# Patient Record
Sex: Male | Born: 1935 | Race: White | Hispanic: No
Health system: Midwestern US, Community
[De-identification: ages and names within clinical notes are randomized; demographics above are authoritative.]

## PROBLEM LIST (undated history)

## (undated) DIAGNOSIS — I2510 Atherosclerotic heart disease of native coronary artery without angina pectoris: Secondary | ICD-10-CM

## (undated) DIAGNOSIS — G4731 Primary central sleep apnea: Secondary | ICD-10-CM

## (undated) DIAGNOSIS — I714 Abdominal aortic aneurysm, without rupture, unspecified: Secondary | ICD-10-CM

## (undated) DIAGNOSIS — Z951 Presence of aortocoronary bypass graft: Secondary | ICD-10-CM

## (undated) DIAGNOSIS — M109 Gout, unspecified: Secondary | ICD-10-CM

## (undated) DIAGNOSIS — K635 Polyp of colon: Secondary | ICD-10-CM

## (undated) DIAGNOSIS — I739 Peripheral vascular disease, unspecified: Secondary | ICD-10-CM

## (undated) DIAGNOSIS — G629 Polyneuropathy, unspecified: Secondary | ICD-10-CM

## (undated) DIAGNOSIS — I639 Cerebral infarction, unspecified: Secondary | ICD-10-CM

## (undated) DIAGNOSIS — I10 Essential (primary) hypertension: Secondary | ICD-10-CM

## (undated) DIAGNOSIS — R7303 Prediabetes: Secondary | ICD-10-CM

## (undated) DIAGNOSIS — I779 Disorder of arteries and arterioles, unspecified: Secondary | ICD-10-CM

## (undated) DIAGNOSIS — F419 Anxiety disorder, unspecified: Secondary | ICD-10-CM

## (undated) DIAGNOSIS — Z8719 Personal history of other diseases of the digestive system: Secondary | ICD-10-CM

## (undated) DIAGNOSIS — E785 Hyperlipidemia, unspecified: Secondary | ICD-10-CM

## (undated) DIAGNOSIS — K219 Gastro-esophageal reflux disease without esophagitis: Secondary | ICD-10-CM

## (undated) DIAGNOSIS — E782 Mixed hyperlipidemia: Secondary | ICD-10-CM

## (undated) DIAGNOSIS — M1990 Unspecified osteoarthritis, unspecified site: Secondary | ICD-10-CM

## (undated) DIAGNOSIS — G459 Transient cerebral ischemic attack, unspecified: Secondary | ICD-10-CM

## (undated) DIAGNOSIS — I209 Angina pectoris, unspecified: Secondary | ICD-10-CM

## (undated) DIAGNOSIS — I129 Hypertensive chronic kidney disease with stage 1 through stage 4 chronic kidney disease, or unspecified chronic kidney disease: Secondary | ICD-10-CM

## (undated) DIAGNOSIS — F32A Depression, unspecified: Secondary | ICD-10-CM

## (undated) DIAGNOSIS — I219 Acute myocardial infarction, unspecified: Secondary | ICD-10-CM

## (undated) DIAGNOSIS — N400 Enlarged prostate without lower urinary tract symptoms: Secondary | ICD-10-CM

## (undated) DIAGNOSIS — I480 Paroxysmal atrial fibrillation: Secondary | ICD-10-CM

## (undated) DIAGNOSIS — E039 Hypothyroidism, unspecified: Secondary | ICD-10-CM

## (undated) DIAGNOSIS — F329 Major depressive disorder, single episode, unspecified: Secondary | ICD-10-CM

## (undated) HISTORY — DX: Abdominal aortic aneurysm, without rupture: I71.4

## (undated) HISTORY — DX: Gastro-esophageal reflux disease without esophagitis: K21.9

## (undated) HISTORY — PX: FRACTURE SURGERY: SHX138

## (undated) HISTORY — DX: Presence of aortocoronary bypass graft: Z95.1

## (undated) HISTORY — DX: Disorder of arteries and arterioles, unspecified: I77.9

## (undated) HISTORY — DX: Anxiety disorder, unspecified: F41.9

## (undated) HISTORY — DX: Prediabetes: R73.03

## (undated) HISTORY — DX: Major depressive disorder, single episode, unspecified: F32.9

## (undated) HISTORY — DX: Unspecified osteoarthritis, unspecified site: M19.90

## (undated) HISTORY — DX: Peripheral vascular disease, unspecified: I73.9

## (undated) HISTORY — DX: Depression, unspecified: F32.A

## (undated) HISTORY — DX: Primary central sleep apnea: G47.31

## (undated) HISTORY — DX: Mixed hyperlipidemia: E78.2

## (undated) HISTORY — DX: Atherosclerotic heart disease of native coronary artery without angina pectoris: I25.10

## (undated) HISTORY — DX: Paroxysmal atrial fibrillation: I48.0

## (undated) HISTORY — DX: Polyp of colon: K63.5

## (undated) HISTORY — PX: FOOT SURGERY: SHX648

## (undated) HISTORY — PX: TONSILLECTOMY: SUR1361

## (undated) HISTORY — DX: Hypertensive chronic kidney disease with stage 1 through stage 4 chronic kidney disease, or unspecified chronic kidney disease: I12.9

## (undated) HISTORY — PX: RETINAL DETACHMENT SURGERY: SHX105

## (undated) HISTORY — DX: Cerebral infarction, unspecified: I63.9

## (undated) HISTORY — DX: Hypothyroidism, unspecified: E03.9

## (undated) HISTORY — DX: Essential (primary) hypertension: I10

## (undated) HISTORY — PX: COLONOSCOPY: SHX174

## (undated) HISTORY — PX: CATARACT EXTRACTION: SUR2

## (undated) HISTORY — DX: Abdominal aortic aneurysm, without rupture, unspecified: I71.40

## (undated) HISTORY — DX: Hyperlipidemia, unspecified: E78.5

## (undated) HISTORY — PX: INGUINAL HERNIA REPAIR: SUR1180

---
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
## 1958-04-28 DIAGNOSIS — M7061 Trochanteric bursitis, right hip: Secondary | ICD-10-CM | POA: Insufficient documentation

## 1993-04-28 HISTORY — PX: CORONARY ARTERY BYPASS GRAFT: SHX141

## 1998-05-28 ENCOUNTER — Inpatient Hospital Stay (HOSPITAL_COMMUNITY): Admission: EM | Admit: 1998-05-28 | Discharge: 1998-05-29 | Payer: Self-pay | Admitting: Emergency Medicine

## 1998-05-28 ENCOUNTER — Encounter: Payer: Self-pay | Admitting: Emergency Medicine

## 1998-06-23 ENCOUNTER — Emergency Department (HOSPITAL_COMMUNITY): Admission: EM | Admit: 1998-06-23 | Discharge: 1998-06-23 | Payer: Self-pay

## 1999-04-18 ENCOUNTER — Encounter: Admission: RE | Admit: 1999-04-18 | Discharge: 1999-06-18 | Payer: Self-pay | Admitting: Orthopedic Surgery

## 1999-06-04 ENCOUNTER — Ambulatory Visit (HOSPITAL_COMMUNITY): Admission: RE | Admit: 1999-06-04 | Discharge: 1999-06-05 | Payer: Self-pay | Admitting: Cardiology

## 2000-02-04 ENCOUNTER — Emergency Department (HOSPITAL_COMMUNITY): Admission: EM | Admit: 2000-02-04 | Discharge: 2000-02-04 | Payer: Self-pay | Admitting: Emergency Medicine

## 2000-02-04 ENCOUNTER — Encounter: Payer: Self-pay | Admitting: *Deleted

## 2000-02-05 ENCOUNTER — Ambulatory Visit (HOSPITAL_COMMUNITY): Admission: RE | Admit: 2000-02-05 | Discharge: 2000-02-05 | Payer: Self-pay | Admitting: Emergency Medicine

## 2000-02-05 ENCOUNTER — Encounter: Payer: Self-pay | Admitting: Emergency Medicine

## 2000-02-14 ENCOUNTER — Ambulatory Visit: Admission: RE | Admit: 2000-02-14 | Discharge: 2000-02-14 | Payer: Self-pay | Admitting: Orthopedic Surgery

## 2000-02-15 ENCOUNTER — Encounter: Admission: RE | Admit: 2000-02-15 | Discharge: 2000-02-15 | Payer: Self-pay | Admitting: Internal Medicine

## 2000-02-15 ENCOUNTER — Encounter: Payer: Self-pay | Admitting: Internal Medicine

## 2000-02-21 ENCOUNTER — Ambulatory Visit (HOSPITAL_COMMUNITY): Admission: RE | Admit: 2000-02-21 | Discharge: 2000-02-21 | Payer: Self-pay | Admitting: Cardiology

## 2001-01-22 ENCOUNTER — Emergency Department (HOSPITAL_COMMUNITY): Admission: EM | Admit: 2001-01-22 | Discharge: 2001-01-23 | Payer: Self-pay | Admitting: Emergency Medicine

## 2001-04-05 ENCOUNTER — Encounter: Admission: RE | Admit: 2001-04-05 | Discharge: 2001-04-05 | Payer: Self-pay | Admitting: Internal Medicine

## 2001-04-05 ENCOUNTER — Encounter: Payer: Self-pay | Admitting: Internal Medicine

## 2001-10-19 ENCOUNTER — Encounter: Admission: RE | Admit: 2001-10-19 | Discharge: 2001-10-19 | Payer: Self-pay | Admitting: Internal Medicine

## 2001-10-19 ENCOUNTER — Encounter: Payer: Self-pay | Admitting: Internal Medicine

## 2004-09-24 ENCOUNTER — Ambulatory Visit (HOSPITAL_COMMUNITY): Admission: RE | Admit: 2004-09-24 | Discharge: 2004-09-24 | Payer: Self-pay

## 2004-09-24 ENCOUNTER — Ambulatory Visit (HOSPITAL_BASED_OUTPATIENT_CLINIC_OR_DEPARTMENT_OTHER): Admission: RE | Admit: 2004-09-24 | Discharge: 2004-09-24 | Payer: Self-pay

## 2005-02-10 ENCOUNTER — Ambulatory Visit (HOSPITAL_COMMUNITY): Admission: RE | Admit: 2005-02-10 | Discharge: 2005-02-10 | Payer: Self-pay | Admitting: Internal Medicine

## 2006-07-14 ENCOUNTER — Encounter: Admission: RE | Admit: 2006-07-14 | Discharge: 2006-07-14 | Payer: Self-pay | Admitting: Internal Medicine

## 2007-07-26 ENCOUNTER — Encounter: Admission: RE | Admit: 2007-07-26 | Discharge: 2007-07-26 | Payer: Self-pay | Admitting: Internal Medicine

## 2008-04-28 HISTORY — PX: CORONARY ANGIOPLASTY WITH STENT PLACEMENT: SHX49

## 2009-04-03 DIAGNOSIS — K219 Gastro-esophageal reflux disease without esophagitis: Secondary | ICD-10-CM | POA: Insufficient documentation

## 2009-04-03 DIAGNOSIS — Z9861 Coronary angioplasty status: Secondary | ICD-10-CM | POA: Insufficient documentation

## 2009-04-17 ENCOUNTER — Observation Stay (HOSPITAL_COMMUNITY): Admission: EM | Admit: 2009-04-17 | Discharge: 2009-04-18 | Payer: Self-pay | Admitting: Emergency Medicine

## 2009-04-17 ENCOUNTER — Ambulatory Visit: Payer: Self-pay | Admitting: Internal Medicine

## 2009-11-15 DIAGNOSIS — G629 Polyneuropathy, unspecified: Secondary | ICD-10-CM | POA: Insufficient documentation

## 2009-11-15 DIAGNOSIS — R7301 Impaired fasting glucose: Secondary | ICD-10-CM | POA: Insufficient documentation

## 2009-11-15 DIAGNOSIS — I6529 Occlusion and stenosis of unspecified carotid artery: Secondary | ICD-10-CM | POA: Insufficient documentation

## 2009-11-19 ENCOUNTER — Ambulatory Visit: Payer: Self-pay | Admitting: Vascular Surgery

## 2010-04-03 DIAGNOSIS — N401 Enlarged prostate with lower urinary tract symptoms: Secondary | ICD-10-CM | POA: Insufficient documentation

## 2010-07-29 LAB — CARDIAC PANEL(CRET KIN+CKTOT+MB+TROPI)
CK, MB: 1.4 ng/mL (ref 0.3–4.0)
CK, MB: 1.5 ng/mL (ref 0.3–4.0)
Relative Index: INVALID (ref 0.0–2.5)
Total CK: 71 U/L (ref 7–232)
Troponin I: <0.01 ng/mL (ref 0.00–0.06)

## 2010-07-29 LAB — DIFFERENTIAL
Eosinophils Absolute: 0.1 10*3/uL (ref 0.0–0.7)
Eosinophils Relative: 2 % (ref 0–5)
Lymphocytes Relative: 12 % (ref 12–46)
Monocytes Absolute: 0.7 10*3/uL (ref 0.1–1.0)
Neutrophils Relative %: 79 % — ABNORMAL HIGH (ref 43–77)

## 2010-07-29 LAB — POCT CARDIAC MARKERS
CKMB, poc: 1.9 ng/mL (ref 1.0–8.0)
Myoglobin, poc: 182 ng/mL (ref 12–200)
Troponin i, poc: <0.05 ng/mL (ref 0.00–0.09)

## 2010-07-29 LAB — LIPID PANEL
Cholesterol: 109 mg/dL (ref 0–200)
Total CHOL/HDL Ratio: 3.2 RATIO
VLDL: 23 mg/dL (ref 0–40)

## 2010-07-29 LAB — BASIC METABOLIC PANEL
Calcium: 8.8 mg/dL (ref 8.4–10.5)
GFR calc non Af Amer: >60 mL/min (ref >60)
Potassium: 3.7 mEq/L (ref 3.5–5.1)

## 2010-07-29 LAB — CBC
HCT: 36.9 % — ABNORMAL LOW (ref 39.0–52.0)
MCV: 88.9 fL (ref 78.0–100.0)
Platelets: 160 10*3/uL (ref 150–400)
RBC: 4.15 MIL/uL — ABNORMAL LOW (ref 4.22–5.81)

## 2010-09-10 NOTE — Procedures (Signed)
DUPLEX DEEP VENOUS EXAM - LOWER EXTREMITY   INDICATION:  Left leg edema.   HISTORY:  Edema:  Yes.  Trauma/Surgery:  Left ankle fusion of bone.  Pain:  No.  PE:  No.  Previous DVT:  No.  Anticoagulants:  No.  Other:  No.   DUPLEX EXAM:                CFV   SFV   PopV  PTV    GSV                R  L  R  L  R  L  R   L  R  L  Thrombosis    o  o     o     o      o     o  Spontaneous   +  +     +     +      +     +  Phasic        +  +     +     +      +     +  Augmentation  +  +     +     +      +     +  Compressible  +  +     +     +      +     +  Competent     +  +     +     +      +     +   Legend:  + - yes  o - no  p - partial  D - decreased   IMPRESSION:  All veins imaged appear compressible and free from deep  venous thrombus in the left leg.    _____________________________  Quita Skye Hart [place], M.D.   CB/MEDQ  D:  11/19/2009  T:  11/19/2009  Job:  [id number]

## 2010-09-13 NOTE — H&P (Signed)
[place]. [facility]  Patient:    Ian Cummings                           MRN: [id number] Adm. Date:  [id number] Attending:  Silvestre Mesi Dictator:   Donzetta Matters, P.A.                         History and Physical  DATE OF BIRTH:  [birth year]  CHIEF COMPLAINT:  Chest pain.  HISTORY OF PRESENT ILLNESS:  This is a 77 year old male that presents stating that he has had chest pain since mid-January.  This is different from previous angina, with increased fatigue.  This has lasted 5-10 minutes.  No shortness of breath,  nausea, or sweating.  It is a tightness that happens with exertion.  He had a late positive treadmill on May 22, 1999, and also an abnormal Persantine Cardiolite study on May 27, 1999.  Therefore, he is set up for a repeat heart catheterization.  PAST MEDICAL HISTORY:  Atrial fibrillation, TIAs, coronary artery disease, hypercholesterolemia, hypertension, history of smoking, coronary artery bypass grafting, atherosclerotic cardiovascular disease, hiatal hernia, and benign prostatic hypertrophy.  SURGICAL HISTORY:  Coronary artery bypass grafting, August 1995; PTCAs previously; tonsillectomy; right rotator cuff repair; right inguinal hernia repair; mastoidectomy as an infant.  ALLERGIES:  No known drug allergies.  MEDICATIONS:  1. Coumadin 5 mg daily (it has been on hold since Friday).  2. Paxil 20 mg daily.  3. Multivitamin q.d.  4. Visken 5 mg b.i.d.  5. Vitamin E 400 IU q.d.  6. Lipitor 20 mg 1/2 tablet q.d.  7. Vitamin B6.  FAMILY HISTORY:  Mother died, age 7.  Father died, age 12, of unknown causes, probable MI.  He does have positive family history for hypertension.  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  He has been off work since November because of rotator cuff surgery, with Dr. Sherlean Foot.  He is a nonsmoker.  Does drink occasional alcohol.  Does drink decaf coffee.  Denies any drug abuse.  REVIEW OF  SYSTEMS:  Denies any fevers, chills, sweats.  Has had some change in is weight.  States he does sleep well at night.  Denies any change in his vision.  Does not presently wear glasses.  Has been treated for recent sinus infection, ut presently has no change in his hearing.  Chest pain as noted in the HPI.  He does have some skips on his heartbeats.  Denies any shortness of breath.  Denies any  orthopnea or claudication.  States he has had a cough with some yellowish sputum. Is a nonsmoker.  Denies any difficulty with swallowing.  No nausea.  No vomiting. Does have some heartburn or indigestion.  Denies any change in bowel function.  Denies any difficulty with urination.  Does have nocturia x 1.  He does have some painful discomfort to his right shoulder.  Has some weakness and fatigue, but is steady on his feet.  Does not require any assistive devices to ambulate. States he does have history of TIAs.  Has some episodes of depression and feels some problems with stress.  Denies any thyroid disease.  Denies any excessive thirst or excessive hunger.  States he does bleed easily because he is on the Coumadin.  He states e has had some stiffness in his neck since July.  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  It has become a chronic problem since then.  PHYSICAL EXAMINATION:  VITAL SIGNS:  Temperature afebrile.  Pulse 60, respirations 16, blood pressure 138/78.  Weight is 170 pounds.  GENERAL:  Well-developed, well-nourished, white male at the present time in no acute distress.  HEENT:  Pupils equal.  Extraocular movements intact.  Mouth and pharynx is benign. Pink, moist mucosa.  Teeth in good repair.  Normocephalic, atraumatic.  NECK:  Supple.  Midline trachea.  No JVD.  No bruits.  No thyromegaly.  CHEST:  Clear to auscultation with no difficulty with breathing.  No use of  any  accessory muscles.  BREASTS:  Normal contour without tenderness or discharge.  No cervical adenopathy is noted.  HEART:  Regular rate and rhythm.  Normal S1 and S2.  No murmurs, gallops, rubs, or clicks.  ABDOMEN:  Soft, active bowel sounds, nontender.  It is nondistended.  No bruits  over the kidneys.  GENITOURINARY:  Nontender over the bladder.  EXTREMITIES:  Moves upper and lower extremities without any difficulty. Strength is 4/5 upper and 5/5 lower.  No ankle edema is noted.  SKIN:  Warm and dry without jaundice, cyanosis, pallor, or rashes.  Normal capillary refill.  NEUROLOGIC:  Cranial nerves II-XII appear grossly intact.  He is oriented to person, place, time, and situation.  Speech is clear.  There is no resting tremor.  DIAGNOSTIC STUDIES:  Chest x-ray on June 03, 1999 showed no acute disease.  IMPRESSION:  1. Chest pain.  2. Coronary artery disease, status post coronary artery bypass grafting.  3. Hypercholesterolemia.  4. Hypertension.  PLAN:  Follow-up labs.  Schedule for left ___________ possibly, as well as graft heart catheterization and for possible angioplasty if indicated. DD:  06/04/99 TD:  06/04/99 Job: [id number]

## 2010-09-13 NOTE — Op Note (Signed)
NAME:  Ian Cummings, Ian Cummings NO.:  [id number]   MEDICAL RECORD NO.:  [id number]          PATIENT TYPE:  AMB   LOCATION:  [facility]                         FACILITY:  [facility]   PHYSICIAN:  Lorre Munroe., M.D.DATE OF BIRTH:  Sep 12, 1928   DATE OF PROCEDURE:  09/24/2004  DATE OF DISCHARGE:                                 OPERATIVE REPORT   PREOPERATIVE DIAGNOSIS:  Indirect left inguinal hernia.   POSTOPERATIVE DIAGNOSIS:  Indirect left inguinal hernia.   OPERATION:  Repair of left inguinal hernia.   SURGEON:  Lebron Conners, M.D.   ANESTHESIA:  Local with sedation and monitored anesthesia care.   PROCEDURE:  After the patient was monitored and anesthetized, and had  routine preparation and draping of the left groin region, I liberally  infused a long-acting local anesthetic in the operative field and taking  care to block the ilioinguinal nerve.  I used more local anesthetic in the  deeper layers as I deepened my dissection.  I made a 6- to 7-cm more or less  transverse incision from just above the pubic tubercle laterally and  dissected down through the fat until I identified the external oblique and  the superficial inguinal ring.  I opened the external oblique in the  direction of its fibers into the superficial ring, again taking care to  avoid injury to the ilioinguinal nerve and cord structures.  I controlled  the cord at the level of the pubic tubercle and dissected it up, and found  it to be expanded and obviously involved by a hernia.  There was no  appreciable weakness of the medial floor.  I made a longitudinal incision  along the proximal portion of the spermatic cord and separated a large  hernia and lipoma of the cord from the cord vessels and vas deferens.  I  reduced the entire hernia through the deep ring and plugged the deep ring  with a generous plug of polypropylene mesh sewn in place with 3 sutures of 2-  0 silk.  I then fashioned a patch of  polypropylene mesh with a slit cut in  it to allow the spermatic cord to come out of the floor and into the  scrotum.  I then sewed that in with 2-0 Prolene beginning at the pubic  tubercle and sewing medially and superiorly with a basting stitch in the  superficial fascia of the internal oblique, and sewing laterally and  inferiorly with a simple running stitch in the shelving edge of inguinal  ligament.  I used a couple of sutures to sew the mesh together lateral to  the cord to complete the repair and I felt that the repair was secure.  I  then added more local anesthetic and assured good  hemostasis with cautery.  I closed the external oblique and subcutaneous  tissues with separate running layers of 3-0 Vicryl and closed the skin with  interrupted intracuticular 4-0 Vicryl and Steri-Strips.  He tolerated the  operation well.      WB/MEDQ  D:  09/24/2004  T:  09/24/2004  Job:  [id number]  cc:   Soyla Murphy. Renne Crigler, M.D.  [address]  [place]  [place] [address]  Fax: [phone]

## 2010-09-13 NOTE — Discharge Summary (Signed)
[place]. [facility]  Patient:    Ian Cummings, Ian Cummings                          MRN: [id number] Adm. Date:  [id number] Disc. Date: [id number] Attending:  Silvestre Mesi Dictator:   Donzetta Matters, P.A.                           Discharge Summary  DATE OF BIRTH:  Dec 07, 1928  PRINCIPAL DIAGNOSES ON DISCHARGE:  1. Coronary artery disease status post right coronary artery vein graft.  2. High blood pressure.  3. Elevated triglycerides.  4. Elevated cholesterol.  PROCEDURE:  During the patients hospitalization left heart catheterization with  PTCA and stent of RCA vein graft.  CONSULTS:  Dr. Aleen Campi invasive cardiology.  CONDITION ON DISCHARGE:  Stable.  BRIEF HISTORY:  This 77 year old male that was admitted after undergoing elective procedure for a heart catheterization results showing vein graft failure with total occlusion of the RCA vein graft, 3 vessel coronary artery disease, good appearance of the other two vein grafts and LIM graft, has normal left ventricular function, normal abdominal aorta and renal arteries.  He underwent successful PTCA and stenting of the RCA vein graft, also successful perclose of the right femoral artery.  He was then monitored overnight and remained stable.  Vitals remain stable this a.m. going without hematoma or swelling.  I discussed the need for Coumadin, aspirin, and plavix.  Also did review his chart at the office to evaluate his most recent cholesterol readings which did show that his total cholesterol was 155.  LDLs were in the 50s but his triglycerides were  high and in the 300s.  He is going to have Lopid 600 mg b.i.d. added also to his drug regimen.  FOLLOWUP LABS IN THE HOSPITAL:  CBC on June 04, 1999, showed a white count at 8,900, hemoglobin 12.8, hematocrit 36.5.  Platelet count 236.  No further chest  x-rays.  EKG on June 04, 1999, did show sinus bradycardia at the rate of 57. It was  felt that he was stable enough for a discharge to home.  DISCHARGE MEDICATIONS:  1. A new prescription for Plavix 75 mg daily.  2. Lopid 600 mg twice a day before meals.  3. He is to continue his Vitamin B6 daily.  4. Folic acid 1 mg daily.  5. Vitamin E 400 IU daily.  6. Coumadin 5 mg daily.  This is to be started tomorrow.  7. Paxil 20 mg daily.  8. Bisken 5 mg twice a day.  9. He is to continue his Lipitor 20 mg 1/2 tablet daily. 10. Aspirin coated 325 mg daily.  ACTIVITY:  No heavy lifting for 24 hours.  Low cholesterol diet.  WOUND CARE:  He is t9o watch the groin for any bleeding.  SPECIAL INSTRUCTIONS:  Discuss further problems of stiffness to his neck for the past 6 months with Doctors Lucas Mallow or [employer].  Also schedule appointment with Mercy Riding, clinical pharmacist.  Follow cholesterol and triglyceride management.  He is to see Dr. Aleen Campi and Lucas Mallow in 2 weeks and call for an appointment. DD:  06/05/99 TD:  06/05/99 Job: [id number]

## 2010-09-13 NOTE — Cardiovascular Report (Signed)
[place]. [facility]  Patient:    Ian Cummings                           MRN: [id number] Proc. Date: 06/04/99 Adm. Date:  [id number] Attending:  Silvestre Mesi CC:         Cardiac Catheterization Laboratory                        Cardiac Catheterization  PROCEDURE: 1. Left heart catheterization. 2. Coronary cine angiography. 3. Left ventricular cine angiography. 4. Abdominal aortogram. 5. Vein graft cine angiography. 6. Left internal mammary graft cine angiography. 7. Angioplasty with stent placement in the right coronary artery vein graft. 8. Perclose of the right femoral artery.  CARDIOLOGIST:  Aram Candela. Aleen Campi, M.D.  INDICATIONS:  This 77 year old male has a history of three-vessel coronary artery disease, and is status post coronary artery bypass graft surgery in 1995, after  failure of several balloon angioplasty procedures.  He now returns with exertional angina and a restress Cardiolite showed probable evidence for myocardial ischemia.   DESCRIPTION OF PROCEDURE:  After signing an informed consent, the patient was premedicated with 50 mg of Benadryl intravenously and brought to the cardiac catheterization laboratory at [facility].  His right groin was prepped and draped in a sterile fashion, and anesthetized locally with 1% lidocaine.  A 6-French introducer sheath was inserted percutaneously into the right femoral artery.  The 6-French #4 Judkins coronary catheters were used to make injections into the native coronary arteries.  The right coronary catheter was sed to make injections into the vein grafts, including a sequential right coronary artery vein graft, a sequential obtuse marginal and circumflex vein graft, and  diagonal vein graft.  The right coronary catheter was also used to make injection into the left internal mammary artery graft to the left anterior descending coronary artery.  A 6-French pigtail  catheter was used to measure pressures in he left ventricle and aorta, and to make mid-stream injections into the left ventricle and mid-abdominal aorta.  After noting a total occlusion of his native right coronary artery, as well as a total occlusion of his vein graft to his right coronary artery, along with good appearance of his other vein grafts and internal mammary graft, we discussed these findings with the patient and elected to proceed with an angioplasty procedure of his right coronary artery vein graft.  We discussed the possibility of opening his native right coronary artery also.  We  selected a 6-French hockey stick guide catheter which was advanced to the ascending aorta and the tip was easily positioned within the ostium of the right coronary  artery vein graft.  We selected a standard length Cross-it guide wire, which was advanced through the guide catheter into the vein graft, and with moderate to severe difficulty, it was advanced through the totally-occluded area and into the distal segment of the vein graft.  We then withdrew the guide wire and repositioned the guide catheter in the ostium of the native right coronary artery.  After moderate difficulty the guide wire was advanced into the totally-occluded area, and a 2.0 mm x 20.0 mm balloon catheter was advanced over the guide wire and positioned within this totally-occluded area.  After this inflation the balloon catheter was removed and injection again in the right coronary artery showed a dissection line proximally, and no evidence of reflow.  With  an area of long occlusion within this right coronary artery and a small residual vessel, we elected to leave the native right coronary artery without further attempts to do angioplasty, and withdrew he balloon catheter and wire.  The guide catheter was then repositioned within the  vein graft to the right coronary artery, and the guide wire was then  easily passed through the totally-occluded area and again positioned in the distal vein graft. This 2.0 mm x 20.0 mm balloon catheter was then advanced into the right coronary artery vein graft, and sequential dilations were made throughout the graft, beginning in the proximal segment, and extending throughout the graft to the distal segment, each time having an inflation of 30 seconds at 10 atmospheres.  After his sequential dilation of the vein graft, injection again was made into this vein graft, showing opening and antegrade flow with severe stenosis throughout the proximal and middle segment.  We then selected a Sci-Med Magic wall stent, a 5.0 mm long stent, which after the proper preparation was advanced over the guide wire and positioned within the distal and middle segment of the lesion.  This wall stent was then deployed in the distal and middle segment, and after deployment, we selected a 4.0 mm x 30.0 mm Adante balloon catheter.  With the Adante balloon catheter, we  made two inflations in the first stent at 12 atmospheres for 30 seconds.  After  sizing the length required for this second stent in the proximal segment, we chose a 5.0 mm medium stent of the same type, a Sci-Med Magic wall stent, and after the proper preparation this was inserted over the guide wire and positioned within he proximal segment of the right coronary artery vein graft.  This second wall stent was then deployed, following which we reinserted the 4.0 mm x 30.0 mm Adante balloon catheter and two further inflations were made at 12 atmospheres for 30 seconds each.  We then noticed mild stenosis proximal to the stent, and the Adante balloon catheter was positioned within the proximal segment, and a final inflation at 14 atmospheres for 124 seconds was made.  After the spinal inflation, the catheter was removed, and injection again into the right coronary vein graft showed an excellent  angiographic result with 0% residual lesion within the stent, and  very mild, less than 20%, stenosis just proximal to the stent.  There was normal antegrade flow throughout the stent, and very good flow into the distal right  coronary artery system.  There was no evidence of a clot or a dissection.  At the end of the procedure the catheter and sheath were removed from the right femoral artery and hemostasis was easily obtained with a Perclose closure system.  HEMODYNAMIC DATA: Left ventricular pressure:  187/10-27. Aortic pressure:  187/89, with a mean of 126. Left ventricular ejection fraction:  Estimated at approximately 60%.  MEDICATIONS GIVEN:  Heparin 5400 units IV, ReoPro drip per the pharmacy protocol.  CINE FINDINGS: CORONARY CINE ANGIOGRAPHY: 1. Left coronary artery:  The ostium and left main appeared normal. 2. Left anterior descending coronary artery:  The LAD has diffuse severe    stenosis throughout the middle segment with 90%-95% stenosis throughout    the middle segment.  There is distal flow by way of internal mammary graft.    The first diagonal branch is totally occluded. 3. Circumflex coronary artery:  The circumflex coronary artery is totally    occluded in its proximal segment.  The distal segment  fills by way of    vein graft. 4. Right coronary artery:  The right coronary artery is totally occluded in    its middle segment and there is mild collateral flow to the acute angle.    The distal right coronary artery is visualized by way of collateral flow    from the left coronary artery.  VEIN GRAFT CINE ANGIOGRAPHY: 1. Right coronary artery vein graft:  The right coronary artery vein graft was    totally occluded near its origin in the proximal segment.  The remainder    was unvisualized until the angioplasty procedure. 2. Diagonal vein graft:  The diagonal vein graft has a minor lesion in its    proximal segment of approximately 20%, otherwise the diagonal  vein graft    looks normal, and has a normal distal anastomotic site. 3. Circumflex vein graft:  This is a sequential vein graft with two obtuse    marginal branches.  There is a minor 20% lesion in its proximal segment.    This is a focal eccentric lesion and there is minor narrowing in the distal    segment near the first side-to-side anastomotic site.  There is very good    flow into this first obtuse marginal, and there is very good flow into the    distal obtuse marginal and distal circumflex system. 4. Left internal mammary artery graft:  The left internal mammary artery    graft to the LAD appears normal, with very good flow into the mid and    distal LAD.  LEFT VENTRICULAR CINE ANGIOGRAM:  The left ventricular chamber size is mildly enlarged.  The overall left ventricular contractility is normal.  There is normal anterior and apical contraction.  The inferior surface is mildly hypokinetic. he mitral valve is abnormal, with mild to moderate mitral insufficiency.  The aortic valve appears normal.  ABDOMINAL AORTOGRAM:  A mid-stream injection into the abdominal aorta shows a normal-appearing mid-abdominal aorta, with normal renal arteries.  The distal aorta was nonvisualized.  ANGIOPLASTY PROCEDURE:  Initial injections into the right coronary artery show  successful passage of the guide wire through the totally-occluded area and injection following the single balloon dilation shows a proximal dissection but no extension of the dissection and essentially no change in the proximal segment.  Vein graft angioplasty cines show proper positioning of the guide wire, and initial opening throughout the length of the proximal and mid-vein graft, after the balloon dilation with the #2-0 balloon.  Further cines showed proper positioning of the  wall stent in the mid and distal segment, and proper positioning of the #4-0 balloon within this stent, for postdilation. Further cines  showed proper positioning of the proximal stent with good apposition of the two stents over a 1.0 to 2.0 mm segment.  The final injections into the right coronary artery show a minor residual lesion just proximal to the proximal stent of less than 20%. There is normal antegrade flow and a very good appearance of the entire length of the  stented area along with very good antegrade flow, and no evidence of dissection or clot.  There is very good flow into the distal right coronary artery.  This is  sequential graft supplying an anastomosis to the posterior descending and a posterolateral branch to the left ventricle.  FINAL DIAGNOSES: 1. 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 2. Three-vessel coronary artery disease. 3. Good appearance of the  other two vein grafts, with minor proximal plaque    and normal appearance of the left internal mammary graft to the left    anterior descending coronary artery. 4. Normal left ventricular function with mild inferior hypokinesia. 5. Normal abdominal aorta and renal arteries. 6. Successful angioplasty with stenting of the mid and distal segments of    the right coronary artery vein graft. 7. Successful Perclose of the right femoral artery puncture site. DD:  06/04/99 TD:  06/04/99 Job: [id number]

## 2011-03-19 ENCOUNTER — Other Ambulatory Visit: Payer: Self-pay

## 2011-03-25 ENCOUNTER — Ambulatory Visit (INDEPENDENT_AMBULATORY_CARE_PROVIDER_SITE_OTHER): Payer: Medicare (Managed Care) | Admitting: *Deleted

## 2011-03-25 DIAGNOSIS — I739 Peripheral vascular disease, unspecified: Secondary | ICD-10-CM

## 2011-04-29 HISTORY — PX: CAROTID STENT INSERTION: SHX5766

## 2011-05-08 ENCOUNTER — Encounter: Payer: Self-pay | Admitting: Surgery

## 2011-05-09 ENCOUNTER — Encounter: Payer: Self-pay | Admitting: Surgery

## 2011-05-12 ENCOUNTER — Encounter: Payer: Self-pay | Admitting: Surgery

## 2011-05-12 ENCOUNTER — Ambulatory Visit (INDEPENDENT_AMBULATORY_CARE_PROVIDER_SITE_OTHER): Payer: Medicare PPO | Admitting: Surgery

## 2011-05-12 VITALS — BP 129/61 | HR 56 | Resp 16 | Ht 71.0 in | Wt 167.0 lb

## 2011-05-12 DIAGNOSIS — G458 Other transient cerebral ischemic attacks and related syndromes: Secondary | ICD-10-CM

## 2011-05-12 DIAGNOSIS — I739 Peripheral vascular disease, unspecified: Secondary | ICD-10-CM

## 2011-05-12 NOTE — Progress Notes (Signed)
[facility]   Patient name: Ian Cummings MRN: [id number] DOB: 1929/01/22 Sex: male   Referred by: Eric Form  Reason for referral:  Chief Complaint  Patient presents with  . PVD    pain right leg    HISTORY OF PRESENT ILLNESS: This is a very pleasant 78 year old gentleman that I am seeing at the request of Dr. Clelia Croft for evaluation of leg pain. The patient states that for many years she's had problems with his legs. More recently he is having worsening symptoms in his right leg. He describes cramping in his leg giving out approximately half a block. He has had ulcers in the past on his feet however these have healed with the exception of one between his fourth and fifth toe on the left which is placing ointment on it and it has been improving. The patient's symptoms are aggravated with activity and alleviated with rest.  The patient has an extensive cardiac history. He has undergone coronary artery bypass grafting as well as multiple coronary stents. He suffers from hypertension which is medically managed. He's been on a statin for long term for his hyperlipidemia. He does have neuropathy on his feet. He does not smoke.  The patient is scheduled to see a neurologist tomorrow for evaluation of a 80% carotid stenosis. The laterality of which is unknown. He denies neurologic symptoms. Specifically he denies numbness or weakness in either extremity he denies slurred speech. He denies amaurosis fugax.  Past Medical History  Diagnosis Date  . CAD (coronary artery disease)   . Hyperlipidemia   . Hypertension   . Arthritis     Gout  . GERD (gastroesophageal reflux disease)     Past Surgical History  Procedure Date  . Coronary artery bypass graft 1995    numerous stents placed  . Foot surgery   . Coronary angioplasty with stent placement 2010  . Hernia repair     X 2     History   Social History  . Marital Status: Married    Spouse Name: N/A    Number  of Children: N/A  . Years of Education: N/A   Occupational History  . Not on file.   Social History Main Topics  . Smoking status: Former Smoker    Quit date: 04/29/1975  . Smokeless tobacco: Not on file  . Alcohol Use: Yes     Occasional drink  . Drug Use: No  . Sexually Active:    Other Topics Concern  . Not on file   Social History Narrative  . No narrative on file    Family History  Problem Relation Age of Onset  . Heart disease Mother   . Hyperlipidemia Father   . Hypertension Father   . Heart disease Father     Allergies as of 05/12/2011  . (No Known Allergies)    Current Outpatient Prescriptions on File Prior to Visit  Medication Sig Dispense Refill  . allopurinol (ZYLOPRIM) 300 MG tablet Take 300 mg by mouth daily.      Marland Kitchen amLODipine (NORVASC) 10 MG tablet Take 10 mg by mouth daily.      Marland Kitchen aspirin 325 MG tablet Take 325 mg by mouth daily.      Marland Kitchen atenolol (TENORMIN) 50 MG tablet Take 50 mg by mouth daily.      . clopidogrel (PLAVIX) 75 MG tablet Take 75 mg by mouth daily.      . fish oil-omega-3 fatty acids 1000 MG capsule  Take 1 g by mouth daily.       . hydrochlorothiazide (HYDRODIURIL) 25 MG tablet Take 25 mg by mouth daily.      . isosorbide mononitrate (IMDUR) 60 MG 24 hr tablet Take 60 mg by mouth daily.      . nitroGLYCERIN (NITROSTAT) 0.4 MG SL tablet Place 0.4 mg under the tongue every 5 (five) minutes as needed.      . pantoprazole (PROTONIX) 40 MG tablet Take 40 mg by mouth daily.      Marland Kitchen PARoxetine (PAXIL) 20 MG tablet Take 20 mg by mouth every morning.      . ramipril (ALTACE) 2.5 MG tablet Take 2.5 mg by mouth daily.      . rosuvastatin (CRESTOR) 40 MG tablet Take 40 mg by mouth daily.         REVIEW OF SYSTEMS: Possible pain in his right leg when walking. All other review of systems are negative as documented in the encounter form.  PHYSICAL EXAMINATION: General: The patient appears their stated age.  Vital signs are BP 129/61  Pulse 56   Resp 16  Ht 5\' 11"  (1.803 m)  Wt 167 lb (75.751 kg)  BMI 23.29 kg/m2  SpO2 96% HEENT:  No gross abnormalities Pulmonary: Respirations are non-labored Abdomen: Soft and non-tender aorta is not palpable Musculoskeletal: There are no major deformities.   Neurologic: No focal weakness or paresthesias are detected, Skin: There are no ulcer or rashes noted. Healing ulceration between the left fourth and fifth toe without evidence of infection Psychiatric: The patient has normal affect. Cardiovascular: There is a regular rate and rhythm without significant murmur appreciated. Bilateral faint carotid bruits. Palpable femoral pulses.  Diagnostic Studies: ABIs were reviewed. 0.54 on the right and 0.66 on the left  Outside Studies/Documentation   Medication Changes: A prescription was given for cilostazol  Assessment:  #1 claudication 2 carotid artery disease Plan: #1:  The patient is right greater than left leg claudication. I do not feel his symptoms are lifestyle limiting at this time. We have recommended a trial of cilostazol. He will come back in 6 weeks to see if this has helped. #2: Carotid occlusive disease: Basis scheduled to see a neurologist tomorrow for further discussions. We discussed the treatment of carotid occlusive disease including medical management versus stenting versus endarterectomy. In my opinion with his history of in-stent stenosis I would recommend proceeding with carotid endarterectomy. We discussed the risks and benefits of the procedure including the risk of stroke the risk of nerve injury and the risk of recurrent disease. Based on his discussion with the neurologist tomorrow he will contact my office regarding getting is addressed. I told him that since his cardiologist is at [facility] if he would like to have this addressed there that would be appropriate otherwise healthy with happy to take care of this for him. If his stenosis is greater than 80% based on the  ultrasound I would go ahead and schedule him for his operation. We discussed all the issues with surgery today in the office     V. Charlena Cross, M.D. [facility] [place] Office: [phone] Pager:  [phone]

## 2011-06-23 ENCOUNTER — Ambulatory Visit: Payer: Medicare PPO | Admitting: Surgery

## 2011-09-24 DIAGNOSIS — H3090 Unspecified chorioretinal inflammation, unspecified eye: Secondary | ICD-10-CM | POA: Insufficient documentation

## 2011-12-04 DIAGNOSIS — I70219 Atherosclerosis of native arteries of extremities with intermittent claudication, unspecified extremity: Secondary | ICD-10-CM

## 2011-12-04 HISTORY — DX: Atherosclerosis of native arteries of extremities with intermittent claudication, unspecified extremity: I70.219

## 2012-01-15 ENCOUNTER — Encounter (HOSPITAL_COMMUNITY)
Admission: RE | Admit: 2012-01-15 | Discharge: 2012-01-15 | Disposition: A | Discharge status: Home / Self Care | Payer: Medicare PPO | Source: Ambulatory Visit | Attending: Internal Medicine | Admitting: Internal Medicine

## 2012-01-15 DIAGNOSIS — Z5189 Encounter for other specified aftercare: Secondary | ICD-10-CM | POA: Insufficient documentation

## 2012-01-15 DIAGNOSIS — I2510 Atherosclerotic heart disease of native coronary artery without angina pectoris: Secondary | ICD-10-CM | POA: Insufficient documentation

## 2012-01-15 NOTE — Progress Notes (Signed)
Cardiac Rehab Medication Review by a Pharmacist  Does the patient  feel that his/her medications are working for him/her?  yes  Has the patient been experiencing any side effects to the medications prescribed?  no  Does the patient measure his/her own blood pressure or blood glucose at home?  no   Does the patient have any problems obtaining medications due to transportation or finances?   no  Understanding of regimen: excellent Understanding of indications: good Potential of compliance: good- forgets medications once or twice a month    Pharmacist comments: Patient has good understanding of his medications. Of note patient says  he is also on a study medication which could be "placebo"    Dub Mikes 01/15/2012 8:53 AM

## 2012-01-19 ENCOUNTER — Encounter (HOSPITAL_COMMUNITY)
Admission: RE | Admit: 2012-01-19 | Discharge: 2012-01-19 | Disposition: A | Discharge status: Home / Self Care | Payer: Medicare PPO | Source: Ambulatory Visit | Attending: Internal Medicine | Admitting: Internal Medicine

## 2012-01-19 LAB — GLUCOSE, CAPILLARY: Glucose-Capillary: 92 mg/dL (ref 70–99)

## 2012-01-19 NOTE — Progress Notes (Signed)
Pt started cardiac rehab today.  Pt tolerated light exercise without difficulty. Monitor showed Sr with no noted ectopy.  Pt given peanut butter and crackers along with lemonade.  Pt woke up late and did not eat any breakfast.

## 2012-01-21 ENCOUNTER — Encounter (HOSPITAL_COMMUNITY)
Admission: RE | Admit: 2012-01-21 | Discharge: 2012-01-21 | Disposition: A | Discharge status: Home / Self Care | Payer: Medicare PPO | Source: Ambulatory Visit | Attending: Internal Medicine | Admitting: Internal Medicine

## 2012-01-23 ENCOUNTER — Encounter (HOSPITAL_COMMUNITY)
Admission: RE | Admit: 2012-01-23 | Discharge: 2012-01-23 | Disposition: A | Discharge status: Home / Self Care | Payer: Medicare PPO | Source: Ambulatory Visit | Attending: Internal Medicine | Admitting: Internal Medicine

## 2012-01-26 ENCOUNTER — Encounter (HOSPITAL_COMMUNITY)
Admission: RE | Admit: 2012-01-26 | Discharge: 2012-01-26 | Disposition: A | Discharge status: Home / Self Care | Payer: Medicare PPO | Source: Ambulatory Visit | Attending: Internal Medicine | Admitting: Internal Medicine

## 2012-01-28 ENCOUNTER — Encounter (HOSPITAL_COMMUNITY)
Admission: RE | Admit: 2012-01-28 | Discharge: 2012-01-28 | Disposition: A | Discharge status: Home / Self Care | Payer: Medicare PPO | Source: Ambulatory Visit | Attending: Internal Medicine | Admitting: Internal Medicine

## 2012-01-28 DIAGNOSIS — I2510 Atherosclerotic heart disease of native coronary artery without angina pectoris: Secondary | ICD-10-CM | POA: Insufficient documentation

## 2012-01-28 DIAGNOSIS — Z5189 Encounter for other specified aftercare: Secondary | ICD-10-CM | POA: Insufficient documentation

## 2012-01-28 NOTE — Progress Notes (Signed)
Reviewed home exercise with pt today.  Pt plans to walk daily For exercise.  Reviewed THR, pulse, RPE, sign and symptoms, NTG use, and when to call 911 or MD.  Pt voiced understanding.  

## 2012-01-28 NOTE — Progress Notes (Signed)
Pt noted to have low hr 49 during cool down.  Pt asymptomatic.  Strips and rehab report faxed to Dr. Buren Kos for review.

## 2012-01-30 ENCOUNTER — Encounter (HOSPITAL_COMMUNITY)
Admission: RE | Admit: 2012-01-30 | Discharge: 2012-01-30 | Disposition: A | Discharge status: Home / Self Care | Payer: Medicare PPO | Source: Ambulatory Visit | Attending: Internal Medicine | Admitting: Internal Medicine

## 2012-02-02 ENCOUNTER — Encounter (HOSPITAL_COMMUNITY): Payer: Medicare PPO

## 2012-02-02 ENCOUNTER — Encounter (HOSPITAL_COMMUNITY)
Admission: RE | Admit: 2012-02-02 | Discharge: 2012-02-02 | Disposition: A | Discharge status: Home / Self Care | Payer: Medicare PPO | Source: Ambulatory Visit | Attending: Internal Medicine | Admitting: Internal Medicine

## 2012-02-04 ENCOUNTER — Encounter (HOSPITAL_COMMUNITY)
Admission: RE | Admit: 2012-02-04 | Discharge: 2012-02-04 | Disposition: A | Discharge status: Home / Self Care | Payer: Medicare PPO | Source: Ambulatory Visit | Attending: Internal Medicine | Admitting: Internal Medicine

## 2012-02-06 ENCOUNTER — Encounter (HOSPITAL_COMMUNITY)
Admission: RE | Admit: 2012-02-06 | Discharge: 2012-02-06 | Disposition: A | Discharge status: Home / Self Care | Payer: Medicare PPO | Source: Ambulatory Visit | Attending: Internal Medicine | Admitting: Internal Medicine

## 2012-02-09 ENCOUNTER — Encounter (HOSPITAL_COMMUNITY)
Admission: RE | Admit: 2012-02-09 | Discharge: 2012-02-09 | Disposition: A | Discharge status: Home / Self Care | Payer: Medicare PPO | Source: Ambulatory Visit | Attending: Internal Medicine | Admitting: Internal Medicine

## 2012-02-11 ENCOUNTER — Encounter (HOSPITAL_COMMUNITY)
Admission: RE | Admit: 2012-02-11 | Discharge: 2012-02-11 | Disposition: A | Discharge status: Home / Self Care | Payer: Medicare PPO | Source: Ambulatory Visit | Attending: Internal Medicine | Admitting: Internal Medicine

## 2012-02-13 ENCOUNTER — Encounter (HOSPITAL_COMMUNITY)
Admission: RE | Admit: 2012-02-13 | Discharge: 2012-02-13 | Disposition: A | Discharge status: Home / Self Care | Payer: Medicare PPO | Source: Ambulatory Visit | Attending: Internal Medicine | Admitting: Internal Medicine

## 2012-02-16 ENCOUNTER — Encounter (HOSPITAL_COMMUNITY)
Admission: RE | Admit: 2012-02-16 | Discharge: 2012-02-16 | Disposition: A | Discharge status: Home / Self Care | Payer: Medicare PPO | Source: Ambulatory Visit | Attending: Internal Medicine | Admitting: Internal Medicine

## 2012-02-18 ENCOUNTER — Encounter (HOSPITAL_COMMUNITY)
Admission: RE | Admit: 2012-02-18 | Discharge: 2012-02-18 | Disposition: A | Discharge status: Home / Self Care | Payer: Medicare PPO | Source: Ambulatory Visit | Attending: Internal Medicine | Admitting: Internal Medicine

## 2012-02-18 NOTE — Progress Notes (Signed)
Ian Cummings 78 y.o. male Nutrition Note Spoke with pt.  Nutrition Plan and cholesterol goals reviewed with pt. Pt is following Step 2 of the Therapeutic Lifestyle Changes diet. Age-appropriate nutrition discussed. Pt expressed understanding of the information reviewed. Pt aware of nutrition education classes offered and does not plan on attending nutrition classes and declined class handouts "because I know all of it already and I already have resources at home."   Nutrition Diagnosis   Food-and nutrition-related knowledge deficit related to lack of exposure to information as related to diagnosis of: ? CVD ? Pre-DM   Nutrition RX/ Estimated Daily Nutrition Needs for: wt maintenance 2200-2400 Kcal, 70-75 gm fat, 14-16 gm sat fat, 2.2-2.4 gm trans-fat, <1500 mg sodium   Nutrition Intervention   Pt's individual nutrition plan including cholesterol goals reviewed with pt.   Pt to attend the Portion Distortion class - met 01/21/12   Continue client-centered nutrition education by RD, as part of interdisciplinary care. Goal(s)   Pt to describe the benefit of including fruits, vegetables, whole grains, and low-fat dairy products in a heart healthy meal plan. Monitor and Evaluate progress toward nutrition goal with team. Nutrition Risk: Change to Low

## 2012-02-20 ENCOUNTER — Encounter (HOSPITAL_COMMUNITY)
Admission: RE | Admit: 2012-02-20 | Discharge: 2012-02-20 | Disposition: A | Discharge status: Home / Self Care | Payer: Medicare PPO | Source: Ambulatory Visit | Attending: Internal Medicine | Admitting: Internal Medicine

## 2012-02-23 ENCOUNTER — Encounter (HOSPITAL_COMMUNITY): Payer: Medicare PPO

## 2012-02-25 ENCOUNTER — Encounter (HOSPITAL_COMMUNITY)
Admission: RE | Admit: 2012-02-25 | Discharge: 2012-02-25 | Disposition: A | Discharge status: Home / Self Care | Payer: Medicare PPO | Source: Ambulatory Visit | Attending: Internal Medicine | Admitting: Internal Medicine

## 2012-02-27 ENCOUNTER — Encounter (HOSPITAL_COMMUNITY): Payer: Medicare PPO

## 2012-03-01 ENCOUNTER — Encounter (HOSPITAL_COMMUNITY)
Admission: RE | Admit: 2012-03-01 | Discharge: 2012-03-01 | Disposition: A | Discharge status: Home / Self Care | Payer: Medicare PPO | Source: Ambulatory Visit | Attending: Internal Medicine | Admitting: Internal Medicine

## 2012-03-01 DIAGNOSIS — I2510 Atherosclerotic heart disease of native coronary artery without angina pectoris: Secondary | ICD-10-CM | POA: Insufficient documentation

## 2012-03-01 DIAGNOSIS — Z5189 Encounter for other specified aftercare: Secondary | ICD-10-CM | POA: Insufficient documentation

## 2012-03-03 ENCOUNTER — Encounter (HOSPITAL_COMMUNITY)
Admission: RE | Admit: 2012-03-03 | Discharge: 2012-03-03 | Disposition: A | Discharge status: Home / Self Care | Payer: Medicare PPO | Source: Ambulatory Visit | Attending: Internal Medicine | Admitting: Internal Medicine

## 2012-03-05 ENCOUNTER — Encounter (HOSPITAL_COMMUNITY)
Admission: RE | Admit: 2012-03-05 | Discharge: 2012-03-05 | Disposition: A | Discharge status: Home / Self Care | Payer: Medicare PPO | Source: Ambulatory Visit | Attending: Internal Medicine | Admitting: Internal Medicine

## 2012-03-05 NOTE — Progress Notes (Signed)
Ian Cummings 78 y.o. male Nutrition Note Spoke with pt.  Nutrition Survey results reviewed with pt. Pt is following Step 2 of the Therapeutic Lifestyle Changes diet. Pt consumed 4 servings of fruits and vegetables on his 24 hour food record. Pt encouraged to increase fruit and vegetable intake to 5-9 servings daily. Pt expressed understanding of the information reviewed.   Nutrition Diagnosis   Food-and nutrition-related knowledge deficit related to lack of exposure to information as related to diagnosis of: ? CVD ? Pre-DM   Nutrition RX/ Estimated Daily Nutrition Needs for: wt maintenance 2200-2400 Kcal, 70-75 gm fat, 14-16 gm sat fat, 2.2-2.4 gm trans-fat, <1500 mg sodium   Nutrition Intervention   Benefits of adopting Therapeutic Lifestyle Changes discussed when Medficts reviewed.    Continue client-centered nutrition education by RD, as part of interdisciplinary care. Goal(s)   Pt to describe the benefit of including fruits, vegetables, whole grains, and low-fat dairy products in a heart healthy meal plan. Monitor and Evaluate progress toward nutrition goal with team. Nutrition Risk: Low

## 2012-03-08 ENCOUNTER — Encounter (HOSPITAL_COMMUNITY)
Admission: RE | Admit: 2012-03-08 | Discharge: 2012-03-08 | Disposition: A | Discharge status: Home / Self Care | Payer: Medicare PPO | Source: Ambulatory Visit | Attending: Internal Medicine | Admitting: Internal Medicine

## 2012-03-10 ENCOUNTER — Encounter (HOSPITAL_COMMUNITY)
Admission: RE | Admit: 2012-03-10 | Discharge: 2012-03-10 | Disposition: A | Discharge status: Home / Self Care | Payer: Medicare PPO | Source: Ambulatory Visit | Attending: Internal Medicine | Admitting: Internal Medicine

## 2012-03-10 NOTE — Progress Notes (Signed)
Pt noted to have great difficulty getting on the mat for cool down stretches. Pt stated he had a back spasm. Pt assisted to stand after cool down.  Pt moved better getting up but complained of feeling dizzy and weak.  bp 108/50 and blood glucose 125.  Pt remarked that he hadnt checked his blood glucose in weeks.  Pt is diet controlled. Pt had oatmeal with splenda this morning which is a little lighter than his usual.  Pt also admitted that he worked harder today with exercise.  Pt given gatorade to drink and felt better.  Standing blood pressure 118/50.  Pt advised to eat something with a protein component for breakfast.  Pt also advised to use the chair for stretches.  Pt with known hip discomfort and has recently had xrays.  Pt will see Dr. Rayburn Ma on 11/27 as a orthopedic consult.  Pt verbalized understanding.

## 2012-03-12 ENCOUNTER — Encounter (HOSPITAL_COMMUNITY): Payer: Medicare PPO

## 2012-03-12 ENCOUNTER — Encounter (HOSPITAL_COMMUNITY)
Admission: RE | Admit: 2012-03-12 | Discharge: 2012-03-12 | Disposition: A | Discharge status: Home / Self Care | Payer: Medicare PPO | Source: Ambulatory Visit | Attending: Internal Medicine | Admitting: Internal Medicine

## 2012-03-15 ENCOUNTER — Encounter (HOSPITAL_COMMUNITY)
Admission: RE | Admit: 2012-03-15 | Discharge: 2012-03-15 | Disposition: A | Discharge status: Home / Self Care | Payer: Medicare PPO | Source: Ambulatory Visit | Attending: Internal Medicine | Admitting: Internal Medicine

## 2012-03-17 ENCOUNTER — Encounter (HOSPITAL_COMMUNITY): Payer: Medicare PPO

## 2012-03-19 ENCOUNTER — Encounter (HOSPITAL_COMMUNITY): Payer: Medicare PPO

## 2012-03-22 ENCOUNTER — Encounter (HOSPITAL_COMMUNITY)
Admission: RE | Admit: 2012-03-22 | Discharge: 2012-03-22 | Disposition: A | Discharge status: Home / Self Care | Payer: Medicare PPO | Source: Ambulatory Visit | Attending: Internal Medicine | Admitting: Internal Medicine

## 2012-03-24 ENCOUNTER — Encounter (HOSPITAL_COMMUNITY)
Admission: RE | Admit: 2012-03-24 | Discharge: 2012-03-24 | Disposition: A | Discharge status: Home / Self Care | Payer: Medicare PPO | Source: Ambulatory Visit | Attending: Internal Medicine | Admitting: Internal Medicine

## 2012-03-29 ENCOUNTER — Encounter (HOSPITAL_COMMUNITY): Payer: Medicare PPO

## 2012-03-31 ENCOUNTER — Encounter (HOSPITAL_COMMUNITY)
Admission: RE | Admit: 2012-03-31 | Discharge: 2012-03-31 | Disposition: A | Discharge status: Home / Self Care | Payer: Medicare PPO | Source: Ambulatory Visit | Attending: Internal Medicine | Admitting: Internal Medicine

## 2012-03-31 DIAGNOSIS — Z5189 Encounter for other specified aftercare: Secondary | ICD-10-CM | POA: Insufficient documentation

## 2012-03-31 DIAGNOSIS — I2510 Atherosclerotic heart disease of native coronary artery without angina pectoris: Secondary | ICD-10-CM | POA: Insufficient documentation

## 2012-03-31 NOTE — Progress Notes (Signed)
Pt arrived to exercise session. Pt informed rehab staff that he would not be able to continue at this time.  Pt seen by Dr. Rayburn Ma and was advised to do therapy at home for his hip and now back discomfort.  Pt would like to go on medical leave and re contact cardiac rehab in January to resume his exercise/

## 2012-04-02 ENCOUNTER — Encounter (HOSPITAL_COMMUNITY): Payer: Medicare PPO

## 2012-04-05 ENCOUNTER — Encounter (HOSPITAL_COMMUNITY): Payer: Medicare PPO

## 2012-04-07 ENCOUNTER — Encounter (HOSPITAL_COMMUNITY): Payer: Medicare PPO

## 2012-04-09 ENCOUNTER — Encounter (HOSPITAL_COMMUNITY): Payer: Medicare PPO

## 2012-04-12 ENCOUNTER — Encounter (HOSPITAL_COMMUNITY): Payer: Medicare PPO

## 2012-04-12 ENCOUNTER — Encounter (HOSPITAL_COMMUNITY)
Admission: RE | Admit: 2012-04-12 | Discharge: 2012-04-12 | Disposition: A | Discharge status: Home / Self Care | Payer: Medicare PPO | Source: Ambulatory Visit | Attending: Internal Medicine | Admitting: Internal Medicine

## 2012-04-14 ENCOUNTER — Encounter (HOSPITAL_COMMUNITY): Payer: Medicare PPO

## 2012-04-14 ENCOUNTER — Emergency Department (HOSPITAL_COMMUNITY): Payer: Medicare PPO

## 2012-04-14 ENCOUNTER — Emergency Department (HOSPITAL_COMMUNITY)
Admission: EM | Admit: 2012-04-14 | Discharge: 2012-04-14 | Disposition: A | Discharge status: Home / Self Care | Payer: Medicare PPO | Attending: Emergency Medicine | Admitting: Emergency Medicine

## 2012-04-14 ENCOUNTER — Encounter (HOSPITAL_COMMUNITY): Payer: Self-pay | Admitting: *Deleted

## 2012-04-14 DIAGNOSIS — K219 Gastro-esophageal reflux disease without esophagitis: Secondary | ICD-10-CM | POA: Insufficient documentation

## 2012-04-14 DIAGNOSIS — R002 Palpitations: Secondary | ICD-10-CM | POA: Insufficient documentation

## 2012-04-14 DIAGNOSIS — Z9889 Other specified postprocedural states: Secondary | ICD-10-CM | POA: Insufficient documentation

## 2012-04-14 DIAGNOSIS — Z951 Presence of aortocoronary bypass graft: Secondary | ICD-10-CM | POA: Insufficient documentation

## 2012-04-14 DIAGNOSIS — Z79899 Other long term (current) drug therapy: Secondary | ICD-10-CM | POA: Insufficient documentation

## 2012-04-14 DIAGNOSIS — Z87891 Personal history of nicotine dependence: Secondary | ICD-10-CM | POA: Insufficient documentation

## 2012-04-14 DIAGNOSIS — Z8679 Personal history of other diseases of the circulatory system: Secondary | ICD-10-CM | POA: Insufficient documentation

## 2012-04-14 DIAGNOSIS — I10 Essential (primary) hypertension: Secondary | ICD-10-CM | POA: Insufficient documentation

## 2012-04-14 DIAGNOSIS — E785 Hyperlipidemia, unspecified: Secondary | ICD-10-CM | POA: Insufficient documentation

## 2012-04-14 DIAGNOSIS — I2510 Atherosclerotic heart disease of native coronary artery without angina pectoris: Secondary | ICD-10-CM | POA: Insufficient documentation

## 2012-04-14 DIAGNOSIS — Z8739 Personal history of other diseases of the musculoskeletal system and connective tissue: Secondary | ICD-10-CM | POA: Insufficient documentation

## 2012-04-14 DIAGNOSIS — Z7901 Long term (current) use of anticoagulants: Secondary | ICD-10-CM | POA: Insufficient documentation

## 2012-04-14 DIAGNOSIS — Z7982 Long term (current) use of aspirin: Secondary | ICD-10-CM | POA: Insufficient documentation

## 2012-04-14 LAB — POCT I-STAT, CHEM 8
BUN: 26 mg/dL — ABNORMAL HIGH (ref 6–23)
Calcium, Ion: 1.16 mmol/L (ref 1.13–1.30)
Chloride: 101 mEq/L (ref 96–112)
Creatinine, Ser: 1 mg/dL (ref 0.50–1.35)
Glucose, Bld: 136 mg/dL — ABNORMAL HIGH (ref 70–99)
TCO2: 28 mmol/L (ref 0–100)

## 2012-04-14 LAB — POCT I-STAT TROPONIN I: Troponin i, poc: 0 ng/mL (ref 0.00–0.08)

## 2012-04-14 LAB — CBC
HCT: 41.9 % (ref 39.0–52.0)
Hemoglobin: 14.9 g/dL (ref 13.0–17.0)
MCH: 32.3 pg (ref 26.0–34.0)
MCV: 90.7 fL (ref 78.0–100.0)
Platelets: 141 10*3/uL — ABNORMAL LOW (ref 150–400)
RBC: 4.62 MIL/uL (ref 4.22–5.81)
WBC: 6.9 10*3/uL (ref 4.0–10.5)

## 2012-04-14 NOTE — ED Provider Notes (Signed)
History     CSN: [id number]  Arrival date & time 04/14/12  0235   First MD Initiated Contact with Patient 04/14/12 0256      Chief Complaint  Patient presents with  . Tachycardia    (Consider location/radiation/quality/duration/timing/severity/associated sxs/prior treatment) HPI 78 year old male presents to emergency department complaining of elevated blood pressure and fast heart rate. Patient reports tonight he had episode of his typical reflux with tightness in his throat and a taste of wine and peanuts in the back of his mouth (last meal before bed). Patient had resolution of his GERD symptoms, and laid down. Soon after that he had the sensation that his heart was racing. He took his blood pressure, and his cuff read his heart rate at 158 with a blood pressure of 140s over 80s area and he checked it a few more times, and BP got up to 179/100. Patient denies any shortness of breath at any time. He had some slight tightness in his chest. Upon arrival to the emergency apartment, he had resolution of the fast heart rate. Pt's total time of fast HR about 20 min.  Patient with extensive past cardiac history with a CABG and multiple stents. He has had problems with his stents occluding early. He is followed at [facility] cardiology [facility]. Patient's last stent was placed in July. He was seen by his cardiologist last week and had a clean bill of health. No prior history of dysrhythmia, atrial fibrillation SVT or other fast heart rate. He denies any current shortness of breath chest pain leg swelling or calf pain.  Past Medical History  Diagnosis Date  . CAD (coronary artery disease)   . Hyperlipidemia   . Hypertension   . Arthritis     Gout  . GERD (gastroesophageal reflux disease)   . Angina at rest     Past Surgical History  Procedure Date  . Coronary artery bypass graft 1995    numerous stents placed  . Foot surgery   . Coronary angioplasty with stent placement 2010  .  Hernia repair     X 2     Family History  Problem Relation Age of Onset  . Heart disease Mother   . Hyperlipidemia Father   . Hypertension Father   . Heart disease Father     History  Substance Use Topics  . Smoking status: Former Smoker    Quit date: 04/29/1975  . Smokeless tobacco: Not on file  . Alcohol Use: Yes     Comment: Occasional drink      Review of Systems  See History of Present Illness; otherwise all other systems are reviewed and negative  Allergies  Review of patient's allergies indicates no known allergies.  Home Medications   Current Outpatient Rx  Name  Route  Sig  Dispense  Refill  . ALLOPURINOL 300 MG PO TABS   Oral   Take 300 mg by mouth daily.         Marland Kitchen AMLODIPINE BESYLATE 10 MG PO TABS   Oral   Take 10 mg by mouth daily.         . ASPIRIN 81 MG PO TABS   Oral   Take 162 mg by mouth 2 (two) times daily.         . ATENOLOL 50 MG PO TABS   Oral   Take 50 mg by mouth daily.         Marland Kitchen VITAMIN D 1000 UNITS PO TABS  Oral   Take 2,000 Units by mouth daily.         Marland Kitchen CLOPIDOGREL BISULFATE 75 MG PO TABS   Oral   Take 75 mg by mouth daily.         . OMEGA-3 FATTY ACIDS 1000 MG PO CAPS   Oral   Take 1 g by mouth daily.          Marland Kitchen HYDROCHLOROTHIAZIDE 25 MG PO TABS   Oral   Take 25 mg by mouth daily.         . ISOSORBIDE MONONITRATE ER 60 MG PO TB24   Oral   Take 60 mg by mouth daily.         Marland Kitchen LANSOPRAZOLE 15 MG PO CPDR   Oral   Take 15 mg by mouth daily.         Marland Kitchen RENA-VITE PO TABS   Oral   Take 1 tablet by mouth daily.         Marland Kitchen NITROGLYCERIN 0.4 MG SL SUBL   Sublingual   Place 0.4 mg under the tongue every 5 (five) minutes as needed.         Marland Kitchen PAROXETINE HCL 20 MG PO TABS   Oral   Take 20 mg by mouth every morning.         Marland Kitchen RAMIPRIL 2.5 MG PO TABS   Oral   Take 2.5 mg by mouth daily.         Marland Kitchen ROSUVASTATIN CALCIUM 40 MG PO TABS   Oral   Take 40 mg by mouth daily.         Marland Kitchen VITAMIN  B-12 1000 MCG PO TABS   Oral   Take 1,000 mcg by mouth daily.           BP 169/84  Pulse 109  Temp 98.2 F (36.8 C) (Oral)  Resp 18  Ht 5\' 11"  (1.803 m)  Wt 158 lb (71.668 kg)  BMI 22.04 kg/m2  SpO2 96%  Physical Exam  Nursing note and vitals reviewed. Constitutional: He is oriented to person, place, and time. He appears well-developed and well-nourished.  HENT:  Head: Normocephalic and atraumatic.  Nose: Nose normal.  Mouth/Throat: Oropharynx is clear and moist.  Eyes: Conjunctivae normal and EOM are normal. Pupils are equal, round, and reactive to light.  Neck: Normal range of motion. Neck supple. No JVD present. No tracheal deviation present. No thyromegaly present.  Cardiovascular: Normal rate, regular rhythm, normal heart sounds and intact distal pulses.  Exam reveals no gallop and no friction rub.   No murmur heard. Pulmonary/Chest: Effort normal and breath sounds normal. No stridor. No respiratory distress. He has no wheezes. He has no rales. He exhibits no tenderness.  Abdominal: Soft. Bowel sounds are normal. He exhibits no distension and no mass. There is no tenderness. There is no rebound and no guarding.  Musculoskeletal: Normal range of motion. He exhibits no edema and no tenderness.  Lymphadenopathy:    He has no cervical adenopathy.  Neurological: He is alert and oriented to person, place, and time. No cranial nerve deficit. He exhibits normal muscle tone. Coordination normal.  Skin: Skin is warm and dry. No rash noted. No erythema. No pallor.  Psychiatric: He has a normal mood and affect. His behavior is normal. Judgment and thought content normal.    ED Course  Procedures (including critical care time)  Labs Reviewed  CBC - Abnormal; Notable for the following:    Platelets 141 (*)  All other components within normal limits  POCT I-STAT, CHEM 8 - Abnormal; Notable for the following:    BUN 26 (*)     Glucose, Bld 136 (*)     All other components  within normal limits  POCT I-STAT TROPONIN I   Dg Chest 2 View  04/14/2012  *RADIOLOGY REPORT*  Clinical Data: Tachycardia  CHEST - 2 VIEW  Comparison: 04/17/2009  Findings: Status post median sternotomy and CABG.  Mild senescent changes and bibasilar scarring or atelectasis.  Otherwise, no confluent airspace opacity, pleural effusion, or pneumothorax. Multilevel degenerative change.  IMPRESSION: Status post median sternotomy and CABG.  Mild lung base scarring or atelectasis.   Original Report Authenticated By: Jearld Lesch, M.D.     Date: 04/14/2012  Rate: 100  Rhythm: normal sinus rhythm  QRS Axis: left  Intervals: normal  ST/T Wave abnormalities: nonspecific ST changes  Conduction Disutrbances:incomplete RBBB  Narrative Interpretation:   Old EKG Reviewed: none available    1. Heart palpitations       MDM  78 year old male with cardiac history with brief episode of fast heart rate into the 150s. Will discuss with his cardiologist. Workup here unremarkable.  D/w Dr Nobie Putnam at [facility].  Pt to f/u with his cardiologist who will be contacting him.  Pt and wife updated on findings and plan.        Olivia Mackie, MD 04/14/12 0530

## 2012-04-14 NOTE — ED Notes (Addendum)
Pt with hx of multiple 11 stents to ED c/o acute onset fast hr.  HR at home was in 150's and sbp was in 180's.  PT states he also had difficulty swallowing tonight d/t what he thinks is r/t gerd.  Also weight loss without trying over the past month.

## 2012-04-14 NOTE — ED Notes (Signed)
Patient presents stating when he got up to go to the BR at home, he felt like his heart was racing.  He used his BP machine and his heart rate was in the 150's and his BP was 190/110.  Denies nausea, SOB ("no more than usual"), diaphoresis.  Denies pain just "chest tightness".

## 2012-04-16 ENCOUNTER — Encounter (HOSPITAL_COMMUNITY): Payer: Medicare PPO

## 2012-04-16 ENCOUNTER — Encounter (HOSPITAL_COMMUNITY)
Admission: RE | Admit: 2012-04-16 | Discharge: 2012-04-16 | Disposition: A | Discharge status: Home / Self Care | Payer: Medicare PPO | Source: Ambulatory Visit | Attending: Internal Medicine | Admitting: Internal Medicine

## 2012-04-16 NOTE — Progress Notes (Signed)
Pt reported to rehab staff that he went to the emergency room on Tuesday night due to high bp and fast Hr. Pt noted on his BP machine a hr of 158. Pt seen and released to follow up with his cardiologist at [facility]. Pt noted that when he returned home he was missing his Atenolol in his pill dispenser for the last 5 days.  Pt phoned and talked to his cardiologist nurse and relayed what he thought happened.  Dr.  Did not require pt to come in to the office for follow up.  Pt with hr 68 no ectopy noted.  Pt completed exercise with no complaints.

## 2012-04-16 NOTE — Progress Notes (Signed)
Pt returned to rehab after extended absence due to orthopedic complaints to bilateral hips.  Pt received cortisone injections in both hips.  Pt able to return to exercise.  Plan to modify equipment choice and workloads as tolerated.

## 2012-04-19 ENCOUNTER — Encounter (HOSPITAL_COMMUNITY): Payer: Medicare PPO

## 2012-04-19 ENCOUNTER — Encounter (HOSPITAL_COMMUNITY)
Admission: RE | Admit: 2012-04-19 | Discharge: 2012-04-19 | Disposition: A | Discharge status: Home / Self Care | Payer: Medicare PPO | Source: Ambulatory Visit | Attending: Internal Medicine | Admitting: Internal Medicine

## 2012-04-23 ENCOUNTER — Encounter (HOSPITAL_COMMUNITY): Payer: Medicare PPO

## 2012-04-23 ENCOUNTER — Encounter (HOSPITAL_COMMUNITY)
Admission: RE | Admit: 2012-04-23 | Discharge: 2012-04-23 | Disposition: A | Discharge status: Home / Self Care | Payer: Medicare PPO | Source: Ambulatory Visit | Attending: Internal Medicine | Admitting: Internal Medicine

## 2012-04-26 ENCOUNTER — Encounter (HOSPITAL_COMMUNITY)
Admission: RE | Admit: 2012-04-26 | Discharge: 2012-04-26 | Disposition: A | Discharge status: Home / Self Care | Payer: Medicare PPO | Source: Ambulatory Visit | Attending: Internal Medicine | Admitting: Internal Medicine

## 2012-04-30 ENCOUNTER — Encounter (HOSPITAL_COMMUNITY): Payer: Medicare PPO

## 2012-05-03 ENCOUNTER — Encounter (HOSPITAL_COMMUNITY): Payer: Medicare PPO

## 2012-05-05 ENCOUNTER — Encounter (HOSPITAL_COMMUNITY)
Admission: RE | Admit: 2012-05-05 | Discharge: 2012-05-05 | Disposition: A | Discharge status: Home / Self Care | Payer: Medicare PPO | Source: Ambulatory Visit | Attending: Internal Medicine | Admitting: Internal Medicine

## 2012-05-05 DIAGNOSIS — Z5189 Encounter for other specified aftercare: Secondary | ICD-10-CM | POA: Insufficient documentation

## 2012-05-05 DIAGNOSIS — I2510 Atherosclerotic heart disease of native coronary artery without angina pectoris: Secondary | ICD-10-CM | POA: Insufficient documentation

## 2012-05-07 ENCOUNTER — Encounter (HOSPITAL_COMMUNITY)
Admission: RE | Admit: 2012-05-07 | Discharge: 2012-05-07 | Disposition: A | Discharge status: Home / Self Care | Payer: Medicare PPO | Source: Ambulatory Visit | Attending: Internal Medicine | Admitting: Internal Medicine

## 2012-05-10 ENCOUNTER — Encounter (HOSPITAL_COMMUNITY)
Admission: RE | Admit: 2012-05-10 | Discharge: 2012-05-10 | Disposition: A | Discharge status: Home / Self Care | Payer: Medicare PPO | Source: Ambulatory Visit | Attending: Internal Medicine | Admitting: Internal Medicine

## 2012-05-12 ENCOUNTER — Encounter (HOSPITAL_COMMUNITY)
Admission: RE | Admit: 2012-05-12 | Discharge: 2012-05-12 | Disposition: A | Discharge status: Home / Self Care | Payer: Medicare PPO | Source: Ambulatory Visit | Attending: Internal Medicine | Admitting: Internal Medicine

## 2012-05-14 ENCOUNTER — Encounter (HOSPITAL_COMMUNITY)
Admission: RE | Admit: 2012-05-14 | Discharge: 2012-05-14 | Disposition: A | Discharge status: Home / Self Care | Payer: Medicare PPO | Source: Ambulatory Visit | Attending: Internal Medicine | Admitting: Internal Medicine

## 2012-05-17 ENCOUNTER — Encounter (HOSPITAL_COMMUNITY)
Admission: RE | Admit: 2012-05-17 | Discharge: 2012-05-17 | Disposition: A | Discharge status: Home / Self Care | Payer: Medicare PPO | Source: Ambulatory Visit | Attending: Internal Medicine | Admitting: Internal Medicine

## 2012-05-17 NOTE — Progress Notes (Signed)
Pt graduates today form Phase II Cardiac Rehab program completing 36 exercise sessions.  Pt plans to continue his home home exercise with silver sneakers at [facility].

## 2012-05-19 ENCOUNTER — Encounter (HOSPITAL_COMMUNITY): Payer: Medicare PPO

## 2012-05-21 ENCOUNTER — Encounter (HOSPITAL_COMMUNITY): Payer: Medicare PPO

## 2012-07-27 DIAGNOSIS — I219 Acute myocardial infarction, unspecified: Secondary | ICD-10-CM

## 2012-07-27 HISTORY — DX: Acute myocardial infarction, unspecified: I21.9

## 2012-08-16 ENCOUNTER — Encounter: Payer: Self-pay | Admitting: Emergency Medicine

## 2012-08-16 ENCOUNTER — Emergency Department (HOSPITAL_COMMUNITY): Payer: Medicare PPO

## 2012-08-16 ENCOUNTER — Encounter (HOSPITAL_COMMUNITY): Payer: Self-pay | Admitting: *Deleted

## 2012-08-16 ENCOUNTER — Emergency Department (HOSPITAL_COMMUNITY)
Admission: EM | Admit: 2012-08-16 | Discharge: 2012-08-16 | Disposition: A | Discharge status: Home / Self Care | Payer: Medicare PPO | Attending: Emergency Medicine | Admitting: Emergency Medicine

## 2012-08-16 DIAGNOSIS — Z79899 Other long term (current) drug therapy: Secondary | ICD-10-CM | POA: Insufficient documentation

## 2012-08-16 DIAGNOSIS — E785 Hyperlipidemia, unspecified: Secondary | ICD-10-CM | POA: Insufficient documentation

## 2012-08-16 DIAGNOSIS — I209 Angina pectoris, unspecified: Secondary | ICD-10-CM | POA: Insufficient documentation

## 2012-08-16 DIAGNOSIS — I2510 Atherosclerotic heart disease of native coronary artery without angina pectoris: Secondary | ICD-10-CM | POA: Insufficient documentation

## 2012-08-16 DIAGNOSIS — M129 Arthropathy, unspecified: Secondary | ICD-10-CM | POA: Insufficient documentation

## 2012-08-16 DIAGNOSIS — M109 Gout, unspecified: Secondary | ICD-10-CM | POA: Insufficient documentation

## 2012-08-16 DIAGNOSIS — Z7982 Long term (current) use of aspirin: Secondary | ICD-10-CM | POA: Insufficient documentation

## 2012-08-16 DIAGNOSIS — R079 Chest pain, unspecified: Secondary | ICD-10-CM

## 2012-08-16 DIAGNOSIS — Z9861 Coronary angioplasty status: Secondary | ICD-10-CM | POA: Insufficient documentation

## 2012-08-16 DIAGNOSIS — Z87891 Personal history of nicotine dependence: Secondary | ICD-10-CM | POA: Insufficient documentation

## 2012-08-16 DIAGNOSIS — R0789 Other chest pain: Secondary | ICD-10-CM | POA: Insufficient documentation

## 2012-08-16 DIAGNOSIS — Z7902 Long term (current) use of antithrombotics/antiplatelets: Secondary | ICD-10-CM | POA: Insufficient documentation

## 2012-08-16 DIAGNOSIS — I10 Essential (primary) hypertension: Secondary | ICD-10-CM | POA: Insufficient documentation

## 2012-08-16 DIAGNOSIS — K219 Gastro-esophageal reflux disease without esophagitis: Secondary | ICD-10-CM | POA: Insufficient documentation

## 2012-08-16 DIAGNOSIS — I252 Old myocardial infarction: Secondary | ICD-10-CM | POA: Insufficient documentation

## 2012-08-16 HISTORY — DX: Acute myocardial infarction, unspecified: I21.9

## 2012-08-16 LAB — CBC
HCT: 37.1 % — ABNORMAL LOW (ref 39.0–52.0)
Hemoglobin: 13.6 g/dL (ref 13.0–17.0)
MCH: 32.1 pg (ref 26.0–34.0)
RBC: 4.24 MIL/uL (ref 4.22–5.81)

## 2012-08-16 LAB — COMPREHENSIVE METABOLIC PANEL
ALT: 26 U/L (ref 0–53)
Alkaline Phosphatase: 40 U/L (ref 39–117)
BUN: 30 mg/dL — ABNORMAL HIGH (ref 6–23)
CO2: 24 mEq/L (ref 19–32)
Calcium: 9.7 mg/dL (ref 8.4–10.5)
GFR calc Af Amer: 67 mL/min — ABNORMAL LOW (ref >90)
GFR calc non Af Amer: 58 mL/min — ABNORMAL LOW (ref >90)
Glucose, Bld: 169 mg/dL — ABNORMAL HIGH (ref 70–99)
Sodium: 137 mEq/L (ref 135–145)

## 2012-08-16 LAB — POCT I-STAT TROPONIN I: Troponin i, poc: 0.08 ng/mL (ref 0.00–0.08)

## 2012-08-16 MED ORDER — ASPIRIN 81 MG PO CHEW
324.0000 mg | CHEWABLE_TABLET | Freq: Once | ORAL | Status: AC
  Administered 2012-08-16: 324 mg via ORAL
  Filled 2012-08-16: qty 4

## 2012-08-16 MED ORDER — NITROGLYCERIN 2 % TD OINT
1.0000 [in_us] | TOPICAL_OINTMENT | Freq: Four times a day (QID) | TRANSDERMAL | Status: DC
  Administered 2012-08-16: 1 [in_us] via TOPICAL
  Filled 2012-08-16: qty 1

## 2012-08-16 MED ORDER — MORPHINE SULFATE 4 MG/ML IJ SOLN
4.0000 mg | Freq: Once | INTRAMUSCULAR | Status: AC
  Administered 2012-08-16: 4 mg via INTRAVENOUS
  Filled 2012-08-16: qty 1

## 2012-08-16 NOTE — ED Notes (Addendum)
Pt states MI with cardiac cath 2 weeks ago.  Was tx at [facility].  Today began experiencing L sided chest tightness that radiates to L arm.  Took 3 sl nitro with no relief.  Took to 81 mg asa this am.  Denies nausea, sob and is non-diaphoretic.  Pt also has hx of 11 stent placements.

## 2012-08-16 NOTE — ED Provider Notes (Signed)
History     CSN: [id number]  Arrival date & time 08/16/12  1600   First MD Initiated Contact with Patient 08/16/12 1737      Chief Complaint  Patient presents with  . Chest Pain    (Consider location/radiation/quality/duration/timing/severity/associated sxs/prior treatment) HPI Comments: 83 y M with PMH of CAD s/p CABG with recent cath 10 days (per pt with 2 grafts occluded) that is on isosorbide and ranolazine for chronic angina that is here for vague left sided chest discomfort that started this morning prior to a visit to his GP that is described as constant, pressure-like.  The CP was partially relieved with NTG x3, but then came back.  No nausea, vomiting, diarrhea, diaphoresis, dysuria or other complaints.  He reports a 6 lb weight gain that he noticed after his most recent trip to the beach where he was reports indulging in oysters and wine.  Patient is a 79 y.o. male presenting with chest pain. The history is provided by the patient.  Chest Pain Pain location:  Substernal area and L chest Pain quality: pressure   Pain radiates to:  L jaw Pain radiates to the back: no   Pain severity:  Mild Onset quality:  Gradual Timing:  Constant Progression:  Worsening Chronicity:  Recurrent Relieved by:  Nitroglycerin Associated symptoms: no abdominal pain, no fever, no nausea and not vomiting     Past Medical History  Diagnosis Date  . CAD (coronary artery disease)   . Hyperlipidemia   . Hypertension   . Arthritis     Gout  . GERD (gastroesophageal reflux disease)   . Angina at rest   . MI (myocardial infarction) 4/14    Past Surgical History  Procedure Laterality Date  . Coronary artery bypass graft  1995    numerous stents placed  . Foot surgery    . Coronary angioplasty with stent placement  2010  . Hernia repair      X 2     Family History  Problem Relation Age of Onset  . Heart disease Mother   . Hyperlipidemia Father   . Hypertension Father   . Heart  disease Father     History  Substance Use Topics  . Smoking status: Former Smoker    Quit date: 04/29/1975  . Smokeless tobacco: Not on file  . Alcohol Use: Yes     Comment: Occasional drink      Review of Systems  Constitutional: Positive for unexpected weight change (gained 6 pounds while at the beach this past week). Negative for fever and chills.  Respiratory: Positive for chest tightness.   Cardiovascular: Positive for chest pain. Negative for leg swelling.  Gastrointestinal: Negative for nausea, vomiting, abdominal pain and diarrhea.  Genitourinary: Negative for dysuria.  All other systems reviewed and are negative.    Allergies  Review of patient's allergies indicates no known allergies.  Home Medications   Current Outpatient Rx  Name  Route  Sig  Dispense  Refill  . allopurinol (ZYLOPRIM) 300 MG tablet   Oral   Take 300 mg by mouth daily.         Marland Kitchen amLODipine (NORVASC) 10 MG tablet   Oral   Take 10 mg by mouth daily.         Marland Kitchen aspirin 81 MG tablet   Oral   Take 162 mg by mouth 2 (two) times daily.         Marland Kitchen atenolol (TENORMIN) 50 MG tablet   Oral  Take 50 mg by mouth daily.         . cholecalciferol (VITAMIN D) 1000 UNITS tablet   Oral   Take 2,000 Units by mouth daily.         . clopidogrel (PLAVIX) 75 MG tablet   Oral   Take 75 mg by mouth daily.         . fish oil-omega-3 fatty acids 1000 MG capsule   Oral   Take 1 g by mouth daily.          . hydrochlorothiazide (HYDRODIURIL) 25 MG tablet   Oral   Take 25 mg by mouth daily.         . isosorbide mononitrate (IMDUR) 60 MG 24 hr tablet   Oral   Take 60 mg by mouth 2 (two) times daily.          . lansoprazole (PREVACID) 15 MG capsule   Oral   Take 15 mg by mouth daily.         . multivitamin (RENA-VIT) TABS tablet   Oral   Take 1 tablet by mouth daily.         . nitroGLYCERIN (NITROSTAT) 0.4 MG SL tablet   Sublingual   Place 0.4 mg under the tongue every 5  (five) minutes as needed.         Marland Kitchen PARoxetine (PAXIL) 20 MG tablet   Oral   Take 20 mg by mouth every morning.         . ramipril (ALTACE) 2.5 MG tablet   Oral   Take 2.5 mg by mouth daily.         . ranolazine (RANEXA) 500 MG 12 hr tablet   Oral   Take 500 mg by mouth 2 (two) times daily.         . rosuvastatin (CRESTOR) 40 MG tablet   Oral   Take 40 mg by mouth daily.         . vitamin B-12 (CYANOCOBALAMIN) 1000 MCG tablet   Oral   Take 1,000 mcg by mouth daily.           BP 146/65  Pulse 59  Temp(Src) 98.3 F (36.8 C) (Oral)  Resp 18  Ht 5\' 11"  (1.803 m)  Wt 170 lb (77.111 kg)  BMI 23.72 kg/m2  SpO2 97%  Physical Exam  Vitals reviewed. Constitutional: He is oriented to person, place, and time. He appears well-developed and well-nourished. No distress.  HENT:  Head: Normocephalic.  Right Ear: External ear normal.  Left Ear: External ear normal.  Nose: Nose normal.  Mouth/Throat: Oropharynx is clear and moist. No oropharyngeal exudate.  Eyes: Conjunctivae and EOM are normal. Pupils are equal, round, and reactive to light.  Neck: Normal range of motion. Neck supple.  Cardiovascular: Normal rate, regular rhythm, normal heart sounds and intact distal pulses.  Exam reveals no gallop and no friction rub.   No murmur heard. Pulmonary/Chest: Effort normal and breath sounds normal.  Healed sternotomy scar  Abdominal: Soft. Bowel sounds are normal. He exhibits no distension. There is no tenderness.  Musculoskeletal: Normal range of motion. He exhibits no edema and no tenderness.  Neurological: He is alert and oriented to person, place, and time. No cranial nerve deficit.  Skin: Skin is warm and dry.  Psychiatric: He has a normal mood and affect.    ED Course  Procedures (including critical care time)  Labs Reviewed  CBC - Abnormal; Notable for the following:    HCT  37.1 (*)    MCHC 36.7 (*)    All other components within normal limits   COMPREHENSIVE METABOLIC PANEL - Abnormal; Notable for the following:    Glucose, Bld 169 (*)    BUN 30 (*)    GFR calc non Af Amer 58 (*)    GFR calc Af Amer 67 (*)    All other components within normal limits  PRO B NATRIURETIC PEPTIDE  POCT I-STAT TROPONIN I  POCT I-STAT TROPONIN I   Dg Chest 2 View  08/16/2012  *RADIOLOGY REPORT*  Clinical Data: Left-sided chest pressure and discomfort.  Coronary artery disease peri  CHEST - 2 VIEW  Comparison: 04/14/2012  Findings: Heart size is within normal limits.  Prior CABG again noted, and coronary stents are again seen along the course of the LAD.  Both lungs are clear.  No evidence of pleural effusion.  No evidence of mass or lymphadenopathy.  IMPRESSION: Stable exam.  No active disease.   Original Report Authenticated By: Myles Rosenthal, M.D.     Date: 08/16/2012  Rate: 57  Rhythm: sinus bradycardia  QRS Axis: left  Intervals: normal  ST/T Wave abnormalities: normal  Conduction Disutrbances:none  Narrative Interpretation:   Old EKG Reviewed: unchanged    1. Chest pain       MDM   53 y M with PMH of CAD s/p CABG with recent cath 10 days (per pt with 2 grafts occluded) that is on isosorbide and ranolazine for chronic angina that is here for vague left sided chest discomfort that started this morning prior to a visit to his GP that is described as constant, pressure-like.  The CP was partially relieved with NTG x3, but then came back.  No nausea, vomiting, diarrhea, diaphoresis, dysuria or other complaints.  He reports a 6 lb weight gain that he noticed after his most recent trip to the beach where he was reports indulging in oysters and wine.  AFVSS.  NAD.  Lungs mostly clear, but slight wheezing in lower lung fields.  No LE edema.  Abd soft, NT.  Diff Dx: ACS, PNA, CHF.  CBC, BMP, trop, CXR, bnp. Nitropaste.  EKG unchanged.  1:46 AM Attempting to discuss case with [facility].  Pt still with mild discomfort so 4 mg Morphine  given.  1:46 AM CXR WNLs.  EKG unchanged.  Spoke with [facility], Dr. Lyndel Safe, and the case was discussed.  Mr. Mota has been fully medically maximized for his chronic angina and is not a candidate for stenting/cath so they were agreeable with a plan for delta trop and home if remains stable.  Per their direction, the pt was instructed to take the isosorbide as a one time dose in the morning instead of divided daily dosing.  They also recommended NTG PRN for discomfort and will f/u closely with the pt tomorrow.  The pt and his wife are also agreeable with this plan.  10:25 PM Delta trop the same.  Return precautions reviewed.  It is felt the pt is stable for d/c with close Cardiology f/u.  All questions answered and patient expressed understanding.  Disposition: Discharge  Condition: Good  Follow-up Information   Follow up with Follow up with your Cardiologist as discussed..      Pt seen in conjunction with my attending, Dr. Anitra Lauth.   Oleh Genin, MD PGY-II [facility] Emergency Medicine Resident     Oleh Genin, MD 08/17/12 [phone]

## 2012-08-19 NOTE — ED Provider Notes (Signed)
I saw and evaluated the patient, reviewed the resident's note and I agree with the findings and plan. I have reviewed EKG and agree with the resident interpretation.  you Pt with known CAD with multiple stents and recent cath who no longer is a candidate for further intervention.  Pt having cp today that feels like his angina however no physical exam findings present and well appearing.  Delta trops neg and discussed with cardiologist at [facility] and pt will d/ce home and f/u with them   Gwyneth Sprout, MD 08/19/12 2311

## 2012-09-02 ENCOUNTER — Encounter: Payer: Self-pay | Admitting: Cardiology

## 2012-09-06 ENCOUNTER — Encounter (HOSPITAL_COMMUNITY): Payer: Self-pay | Admitting: Emergency Medicine

## 2012-09-06 ENCOUNTER — Emergency Department (HOSPITAL_COMMUNITY): Payer: Medicare PPO

## 2012-09-06 ENCOUNTER — Encounter: Payer: Self-pay | Admitting: Cardiology

## 2012-09-06 ENCOUNTER — Inpatient Hospital Stay (HOSPITAL_COMMUNITY)
Admission: EM | Admit: 2012-09-06 | Discharge: 2012-09-09 | DRG: 303 | Disposition: A | Discharge status: Home / Self Care | Payer: Medicare PPO | Attending: Cardiology | Admitting: Cardiology

## 2012-09-06 DIAGNOSIS — Z7982 Long term (current) use of aspirin: Secondary | ICD-10-CM

## 2012-09-06 DIAGNOSIS — Z87891 Personal history of nicotine dependence: Secondary | ICD-10-CM

## 2012-09-06 DIAGNOSIS — Z79899 Other long term (current) drug therapy: Secondary | ICD-10-CM

## 2012-09-06 DIAGNOSIS — K219 Gastro-esophageal reflux disease without esophagitis: Secondary | ICD-10-CM | POA: Diagnosis present

## 2012-09-06 DIAGNOSIS — E785 Hyperlipidemia, unspecified: Secondary | ICD-10-CM | POA: Diagnosis present

## 2012-09-06 DIAGNOSIS — I214 Non-ST elevation (NSTEMI) myocardial infarction: Secondary | ICD-10-CM | POA: Diagnosis present

## 2012-09-06 DIAGNOSIS — I2510 Atherosclerotic heart disease of native coronary artery without angina pectoris: Secondary | ICD-10-CM

## 2012-09-06 DIAGNOSIS — I25810 Atherosclerosis of coronary artery bypass graft(s) without angina pectoris: Principal | ICD-10-CM | POA: Diagnosis present

## 2012-09-06 DIAGNOSIS — I2582 Chronic total occlusion of coronary artery: Secondary | ICD-10-CM | POA: Diagnosis present

## 2012-09-06 DIAGNOSIS — I25110 Atherosclerotic heart disease of native coronary artery with unstable angina pectoris: Secondary | ICD-10-CM

## 2012-09-06 DIAGNOSIS — Z9861 Coronary angioplasty status: Secondary | ICD-10-CM

## 2012-09-06 DIAGNOSIS — M109 Gout, unspecified: Secondary | ICD-10-CM | POA: Diagnosis present

## 2012-09-06 DIAGNOSIS — Z7902 Long term (current) use of antithrombotics/antiplatelets: Secondary | ICD-10-CM

## 2012-09-06 DIAGNOSIS — I70219 Atherosclerosis of native arteries of extremities with intermittent claudication, unspecified extremity: Secondary | ICD-10-CM | POA: Diagnosis present

## 2012-09-06 DIAGNOSIS — I129 Hypertensive chronic kidney disease with stage 1 through stage 4 chronic kidney disease, or unspecified chronic kidney disease: Secondary | ICD-10-CM | POA: Insufficient documentation

## 2012-09-06 DIAGNOSIS — I200 Unstable angina: Secondary | ICD-10-CM

## 2012-09-06 DIAGNOSIS — Z951 Presence of aortocoronary bypass graft: Secondary | ICD-10-CM | POA: Insufficient documentation

## 2012-09-06 DIAGNOSIS — I119 Hypertensive heart disease without heart failure: Secondary | ICD-10-CM | POA: Diagnosis present

## 2012-09-06 HISTORY — DX: Disorder of arteries and arterioles, unspecified: I77.9

## 2012-09-06 HISTORY — DX: Peripheral vascular disease, unspecified: I73.9

## 2012-09-06 HISTORY — DX: Atherosclerotic heart disease of native coronary artery with unstable angina pectoris: I25.110

## 2012-09-06 HISTORY — DX: Benign prostatic hyperplasia without lower urinary tract symptoms: N40.0

## 2012-09-06 HISTORY — DX: Gout, unspecified: M10.9

## 2012-09-06 LAB — BASIC METABOLIC PANEL
CO2: 25 mEq/L (ref 19–32)
Calcium: 9.7 mg/dL (ref 8.4–10.5)
Creatinine, Ser: 1.34 mg/dL (ref 0.50–1.35)
GFR calc Af Amer: 56 mL/min — ABNORMAL LOW (ref >90)
GFR calc non Af Amer: 48 mL/min — ABNORMAL LOW (ref >90)
Sodium: 139 mEq/L (ref 135–145)

## 2012-09-06 LAB — COMPREHENSIVE METABOLIC PANEL
ALT: 25 U/L (ref 0–53)
BUN: 32 mg/dL — ABNORMAL HIGH (ref 6–23)
CO2: 22 mEq/L (ref 19–32)
Calcium: 9.7 mg/dL (ref 8.4–10.5)
Creatinine, Ser: 1.25 mg/dL (ref 0.50–1.35)
GFR calc Af Amer: 61 mL/min — ABNORMAL LOW (ref >90)
GFR calc non Af Amer: 52 mL/min — ABNORMAL LOW (ref >90)
Glucose, Bld: 91 mg/dL (ref 70–99)
Total Protein: 7.3 g/dL (ref 6.0–8.3)

## 2012-09-06 LAB — TROPONIN I
Troponin I: <0.3 ng/mL (ref <0.30)
Troponin I: <0.3 ng/mL (ref <0.30)

## 2012-09-06 LAB — CBC
MCH: 31 pg (ref 26.0–34.0)
MCV: 88.6 fL (ref 78.0–100.0)
Platelets: 161 10*3/uL (ref 150–400)
RBC: 4.22 MIL/uL (ref 4.22–5.81)
RDW: 14.1 % (ref 11.5–15.5)
WBC: 6.8 10*3/uL (ref 4.0–10.5)

## 2012-09-06 LAB — POCT I-STAT TROPONIN I: Troponin i, poc: 0 ng/mL (ref 0.00–0.08)

## 2012-09-06 LAB — PROTIME-INR: Prothrombin Time: 13.1 seconds (ref 11.6–15.2)

## 2012-09-06 MED ORDER — VITAMIN B-12 1000 MCG PO TABS
1000.0000 ug | ORAL_TABLET | Freq: Every day | ORAL | Status: DC
  Administered 2012-09-07 – 2012-09-08 (×2): 1000 ug via ORAL
  Filled 2012-09-06 (×3): qty 1

## 2012-09-06 MED ORDER — PANTOPRAZOLE SODIUM 40 MG PO TBEC
40.0000 mg | DELAYED_RELEASE_TABLET | Freq: Every day | ORAL | Status: DC
  Administered 2012-09-07 – 2012-09-08 (×2): 40 mg via ORAL
  Filled 2012-09-06 (×3): qty 1

## 2012-09-06 MED ORDER — ATORVASTATIN CALCIUM 80 MG PO TABS
80.0000 mg | ORAL_TABLET | Freq: Every day | ORAL | Status: DC
  Administered 2012-09-06 – 2012-09-08 (×3): 80 mg via ORAL
  Filled 2012-09-06 (×5): qty 1

## 2012-09-06 MED ORDER — AMLODIPINE BESYLATE 10 MG PO TABS
10.0000 mg | ORAL_TABLET | Freq: Every day | ORAL | Status: DC
  Administered 2012-09-07 – 2012-09-08 (×2): 10 mg via ORAL
  Filled 2012-09-06 (×3): qty 1

## 2012-09-06 MED ORDER — RAMIPRIL 2.5 MG PO CAPS
2.5000 mg | ORAL_CAPSULE | Freq: Every day | ORAL | Status: DC
  Administered 2012-09-07 – 2012-09-08 (×2): 2.5 mg via ORAL
  Filled 2012-09-06 (×3): qty 1

## 2012-09-06 MED ORDER — SODIUM CHLORIDE 0.9 % IV SOLN
250.0000 mL | INTRAVENOUS | Status: DC | PRN
  Administered 2012-09-06: 250 mL via INTRAVENOUS

## 2012-09-06 MED ORDER — OMEGA-3-ACID ETHYL ESTERS 1 G PO CAPS
1.0000 g | ORAL_CAPSULE | Freq: Every day | ORAL | Status: DC
  Administered 2012-09-07 – 2012-09-08 (×2): 1 g via ORAL
  Filled 2012-09-06 (×3): qty 1

## 2012-09-06 MED ORDER — ATENOLOL 50 MG PO TABS
50.0000 mg | ORAL_TABLET | Freq: Every day | ORAL | Status: DC
  Administered 2012-09-07 – 2012-09-08 (×2): 50 mg via ORAL
  Filled 2012-09-06 (×3): qty 1

## 2012-09-06 MED ORDER — RAMIPRIL 2.5 MG PO TABS: 2.5000 mg | ORAL_TABLET | Freq: Every day | ORAL | Status: DC

## 2012-09-06 MED ORDER — NITROGLYCERIN 0.4 MG SL SUBL: 0.4000 mg | SUBLINGUAL_TABLET | SUBLINGUAL | Status: DC | PRN

## 2012-09-06 MED ORDER — ASPIRIN 81 MG PO TABS: 162.0000 mg | ORAL_TABLET | Freq: Two times a day (BID) | ORAL | Status: DC

## 2012-09-06 MED ORDER — ASPIRIN 81 MG PO CHEW
162.0000 mg | CHEWABLE_TABLET | Freq: Two times a day (BID) | ORAL | Status: DC
  Administered 2012-09-06 – 2012-09-07 (×3): 162 mg via ORAL
  Filled 2012-09-06 (×3): qty 2

## 2012-09-06 MED ORDER — OMEGA-3 FATTY ACIDS 1000 MG PO CAPS: 1.0000 g | ORAL_CAPSULE | Freq: Every day | ORAL | Status: DC

## 2012-09-06 MED ORDER — CLOPIDOGREL BISULFATE 75 MG PO TABS
75.0000 mg | ORAL_TABLET | Freq: Every day | ORAL | Status: DC
  Administered 2012-09-07 – 2012-09-08 (×2): 75 mg via ORAL
  Filled 2012-09-06 (×3): qty 1

## 2012-09-06 MED ORDER — SODIUM CHLORIDE 0.9 % IJ SOLN: 3.0000 mL | INTRAMUSCULAR | Status: DC | PRN

## 2012-09-06 MED ORDER — RENA-VITE PO TABS
1.0000 | ORAL_TABLET | Freq: Every day | ORAL | Status: DC
  Administered 2012-09-07 – 2012-09-08 (×2): 1 via ORAL
  Filled 2012-09-06 (×3): qty 1

## 2012-09-06 MED ORDER — ISOSORBIDE MONONITRATE ER 60 MG PO TB24
60.0000 mg | ORAL_TABLET | Freq: Two times a day (BID) | ORAL | Status: DC
  Administered 2012-09-07 – 2012-09-08 (×4): 60 mg via ORAL
  Filled 2012-09-06 (×7): qty 1

## 2012-09-06 MED ORDER — NITROGLYCERIN IN D5W 200-5 MCG/ML-% IV SOLN
5.0000 ug/min | INTRAVENOUS | Status: DC
  Administered 2012-09-06: 5 ug/min via INTRAVENOUS
  Filled 2012-09-06: qty 250

## 2012-09-06 MED ORDER — VITAMIN D3 25 MCG (1000 UNIT) PO TABS
2000.0000 [IU] | ORAL_TABLET | Freq: Every day | ORAL | Status: DC
  Administered 2012-09-07 – 2012-09-08 (×2): 2000 [IU] via ORAL
  Filled 2012-09-06 (×3): qty 2

## 2012-09-06 MED ORDER — RANOLAZINE ER 500 MG PO TB12
500.0000 mg | ORAL_TABLET | Freq: Two times a day (BID) | ORAL | Status: DC
  Administered 2012-09-06 – 2012-09-08 (×5): 500 mg via ORAL
  Filled 2012-09-06 (×7): qty 1

## 2012-09-06 MED ORDER — ENOXAPARIN SODIUM 80 MG/0.8ML ~~LOC~~ SOLN
75.0000 mg | Freq: Two times a day (BID) | SUBCUTANEOUS | Status: DC
  Administered 2012-09-06 – 2012-09-08 (×4): 75 mg via SUBCUTANEOUS
  Filled 2012-09-06 (×8): qty 0.8

## 2012-09-06 MED ORDER — ACETAMINOPHEN 325 MG PO TABS: 650.0000 mg | ORAL_TABLET | ORAL | Status: DC | PRN

## 2012-09-06 MED ORDER — ONDANSETRON HCL 4 MG/2ML IJ SOLN: 4.0000 mg | Freq: Four times a day (QID) | INTRAMUSCULAR | Status: DC | PRN

## 2012-09-06 MED ORDER — HYDROCHLOROTHIAZIDE 25 MG PO TABS
25.0000 mg | ORAL_TABLET | Freq: Every day | ORAL | Status: DC
  Administered 2012-09-07 – 2012-09-08 (×2): 25 mg via ORAL
  Filled 2012-09-06 (×3): qty 1

## 2012-09-06 MED ORDER — SODIUM CHLORIDE 0.9 % IJ SOLN
3.0000 mL | Freq: Two times a day (BID) | INTRAMUSCULAR | Status: DC
  Administered 2012-09-07: 3 mL via INTRAVENOUS

## 2012-09-06 MED ORDER — PAROXETINE HCL 20 MG PO TABS
20.0000 mg | ORAL_TABLET | Freq: Every day | ORAL | Status: DC
  Administered 2012-09-07 – 2012-09-08 (×2): 20 mg via ORAL
  Filled 2012-09-06 (×3): qty 1

## 2012-09-06 MED ORDER — ALLOPURINOL 300 MG PO TABS
300.0000 mg | ORAL_TABLET | Freq: Every day | ORAL | Status: DC
  Administered 2012-09-07 – 2012-09-08 (×2): 300 mg via ORAL
  Filled 2012-09-06 (×3): qty 1

## 2012-09-06 NOTE — Progress Notes (Signed)
ANTICOAGULATION CONSULT NOTE - Initial Consult  Pharmacy Consult for Lovenox Indication: chest pain/ACS  No Known Allergies  Patient Measurements:   Pt reported:  Ht: 5 ft 11 in Wt: 165 lbs (75 kg)  Vital Signs: Temp: 98.6 F (37 C) (05/12 1505) Temp src: Oral (05/12 1505) BP: 127/55 mmHg (05/12 1505) Pulse Rate: 56 (05/12 1505)  Labs:  Recent Labs  09/06/12 1507  HGB 13.1  HCT 37.4*  PLT 161  CREATININE 1.34    The CrCl is unknown because both a height and weight (above a minimum accepted value) are required for this calculation.   Medical History: Past Medical History  Diagnosis Date  . CAD (coronary artery disease)   . Hyperlipidemia   . Hypertension   . Arthritis     Gout  . GERD (gastroesophageal reflux disease)   . MI (myocardial infarction) 4/14  . Gout   . BPH (benign prostatic hypertrophy)   . Carotid artery disease     Right carotid stent 2013    Medications:  Dual AP therapy with ASA 81/Plavix   Assessment: 79 y/o with extensive cardiac history including CABG in 1995, as well as stents in 2009 and 2013. Scr 1.34, CBC good, no overt bleeding per pt but does have some bruising issues with dual anti-platelet therapy. INR is 1 and aPTT is 29.   Goal of Therapy:   Monitor platelets by anticoagulation protocol: Yes   Plan:  -Start lovenox 75 mg [place] q12h -Minimum q72h CBC -Monitor for bleeding -f/u cardiology plans for cath, etc  Abran Duke, PharmD Clinical Pharmacist Phone: [phone] Pager: [phone] 09/06/2012 5:47 PM

## 2012-09-06 NOTE — ED Provider Notes (Signed)
History     CSN: [id number]  Arrival date & time 09/06/12  1459   First MD Initiated Contact with Patient 09/06/12 1610      Chief Complaint  Patient presents with  . Chest Pain    (Consider location/radiation/quality/duration/timing/severity/associated sxs/prior treatment) HPI Comments: Patient with a history of CAD s/p CABG and Carotid Stents presents today with chest pain.  He reports that the chest pain has been occurring intermittently over the past 4 days.  The pain is located left anterior chest.  Pain does not radiate.  He reports that the pain comes on both at rest and with exertion.  He describes the pain as a sharp pressure.  He states that some episodes last an hour and some episodes last several hours.  Pain gradually becoming more intense and occuring more frequently.  He reports that he took three SL NG for the pain today, which he did not feel helped the pain.  He denies any SOB, cough, fever, chills, nausea, vomiting, or diaphoresis with the chest pain.  His Cardiologist is Dr. Donnie Aho with [facility]..    The history is provided by the patient.    Past Medical History  Diagnosis Date  . CAD (coronary artery disease)   . Hyperlipidemia   . Hypertension   . Arthritis     Gout  . GERD (gastroesophageal reflux disease)   . MI (myocardial infarction) 4/14  . Gout   . BPH (benign prostatic hypertrophy)   . Carotid artery disease     Right carotid stent 2013    Past Surgical History  Procedure Laterality Date  . Coronary artery bypass graft  1995  . Foot surgery    . Coronary angioplasty with stent placement  2010  . Hernia repair      X 2   . Carotid stent insertion  2013    right at [facility]    Family History  Problem Relation Age of Onset  . Heart disease Mother   . Hyperlipidemia Father   . Hypertension Father   . Heart disease Father     History  Substance Use Topics  . Smoking status: Former Smoker    Types: Cigarettes    Quit  date: 04/29/1975  . Smokeless tobacco: Never Used  . Alcohol Use: Yes     Comment: Occasional drink      Review of Systems  Constitutional: Negative for fever and chills.  Respiratory: Negative for cough and shortness of breath.   Cardiovascular: Positive for chest pain.  All other systems reviewed and are negative.    Allergies  Review of patient's allergies indicates no known allergies.  Home Medications   Current Outpatient Rx  Name  Route  Sig  Dispense  Refill  . allopurinol (ZYLOPRIM) 300 MG tablet   Oral   Take 300 mg by mouth daily.         Marland Kitchen amLODipine (NORVASC) 10 MG tablet   Oral   Take 10 mg by mouth daily.         Marland Kitchen aspirin 81 MG tablet   Oral   Take 162 mg by mouth 2 (two) times daily.         Marland Kitchen atenolol (TENORMIN) 50 MG tablet   Oral   Take 50 mg by mouth daily.         . cholecalciferol (VITAMIN D) 1000 UNITS tablet   Oral   Take 2,000 Units by mouth daily.         Marland Kitchen  clopidogrel (PLAVIX) 75 MG tablet   Oral   Take 75 mg by mouth daily.         . fish oil-omega-3 fatty acids 1000 MG capsule   Oral   Take 1 g by mouth daily.          . hydrochlorothiazide (HYDRODIURIL) 25 MG tablet   Oral   Take 25 mg by mouth daily.         . isosorbide mononitrate (IMDUR) 60 MG 24 hr tablet   Oral   Take 60 mg by mouth 2 (two) times daily.          . lansoprazole (PREVACID) 15 MG capsule   Oral   Take 15 mg by mouth daily.         . multivitamin (RENA-VIT) TABS tablet   Oral   Take 1 tablet by mouth daily.         . nitroGLYCERIN (NITROSTAT) 0.4 MG SL tablet   Sublingual   Place 0.4 mg under the tongue every 5 (five) minutes as needed.         Marland Kitchen PARoxetine (PAXIL) 20 MG tablet   Oral   Take 20 mg by mouth every morning.         . ramipril (ALTACE) 2.5 MG tablet   Oral   Take 2.5 mg by mouth daily.         . ranolazine (RANEXA) 500 MG 12 hr tablet   Oral   Take 500 mg by mouth 2 (two) times daily.         .  rosuvastatin (CRESTOR) 40 MG tablet   Oral   Take 40 mg by mouth daily.         . vitamin B-12 (CYANOCOBALAMIN) 1000 MCG tablet   Oral   Take 1,000 mcg by mouth daily.           BP 127/55  Pulse 56  Temp(Src) 98.6 F (37 C) (Oral)  Resp 18  SpO2 99%  Physical Exam  Nursing note and vitals reviewed. Constitutional: He appears well-developed and well-nourished. No distress.  HENT:  Head: Normocephalic and atraumatic.  Neck: Normal range of motion. Neck supple.  Cardiovascular: Normal rate, regular rhythm, normal heart sounds and intact distal pulses.   Pulmonary/Chest: Effort normal and breath sounds normal. No respiratory distress. He has no wheezes. He has no rales.  Abdominal: Soft. There is no tenderness.  Musculoskeletal:  No LE edema  Neurological: He is alert.  Skin: Skin is warm and dry. He is not diaphoretic.  Psychiatric: He has a normal mood and affect.    ED Course  Procedures (including critical care time)  Labs Reviewed  CBC - Abnormal; Notable for the following:    HCT 37.4 (*)    All other components within normal limits  BASIC METABOLIC PANEL - Abnormal; Notable for the following:    Glucose, Bld 101 (*)    BUN 32 (*)    GFR calc non Af Amer 48 (*)    GFR calc Af Amer 56 (*)    All other components within normal limits  PROTIME-INR  APTT  COMPREHENSIVE METABOLIC PANEL  TROPONIN I  POCT I-STAT TROPONIN I   Dg Chest 2 View  09/06/2012  *RADIOLOGY REPORT*  Clinical Data: Chest pain  CHEST - 2 VIEW  Comparison: 08/16/2012  Findings: Cardiomediastinal silhouette is stable.  Status post CABG.  No acute infiltrate or pleural effusion.  No pulmonary edema.  Stable degenerative changes thoracic  spine.  IMPRESSION: No active disease.  No significant change.   Original Report Authenticated By: Natasha Mead, M.D.      No diagnosis found.   Date: 09/07/2012  Rate: 54  Rhythm: sinus bradycardia  QRS Axis: left  Intervals: normal  ST/T Wave  abnormalities: nonspecific ST changes  Conduction Disutrbances:none  Narrative Interpretation:   Old EKG Reviewed: unchanged     MDM  Patient with a history of CAD s/p CABG and stent placement presents with chest pain.  Initial troponin negative.  No ischemic changes on EKG. Patient's Cardiologist Dr. Donnie Aho evaluated the patient upon arrival in the ED and agreed to admit the patient for unstable angina.            Pascal Lux [place], PA-C 09/07/12 [phone]

## 2012-09-06 NOTE — ED Notes (Signed)
Patient transported to X-ray 

## 2012-09-06 NOTE — ED Notes (Signed)
Pt c/o left sided CP starting this am that was relieved with nitro but returned; pt sts pain radiates to back and into neck

## 2012-09-06 NOTE — Progress Notes (Signed)
Patient ID: Ian Cummings, male   DOB: Sep 25, 1928, 79 y.o.   MRN: [id number] Ian Cummings, Ian Cummings  Date of visit:  09/02/2012 DOB:  09-11-1928    Age:  79 yrs. Medical record number:  [id number]     Account number:  [id number] Primary Care Provider: Buren Cummings ____________________________ CURRENT DIAGNOSES  1. CAD. Bypass graft  2. CAD,Native  3. Hyperlipidemia  4. Carotid Artery Stenosis  5. Peripheral Vascular Disease  6. Surgery-Aortocoronary Bypass Grafting ____________________________ ALLERGIES  NKDA ____________________________ MEDICATIONS  1. Ranexa 500 mg tablet extended release 12 hr, BID  2. allopurinol 300 mg tablet, 1 p.o. daily  3. aspirin 81 mg tablet,chewable, 2 bid  4. multivitamin tablet, 1 p.o. daily  5. Vitamin D3 1,000 unit tablet, 1 p.o. daily  6. amlodipine 10 mg tablet, 1 p.o. daily  7. atenolol 50 mg tablet, 1 p.o. daily  8. Crestor 40 mg tablet, 1 p.o. daily  9. clopidogrel 75 mg tablet, 1 p.o. daily  10. hydrochlorothiazide 25 mg tablet, 1 p.o. daily  11. isosorbide mononitrate 60 mg tablet extended release 24 hr, 1 p.o. daily  12. lansoprazole 15 mg tablet,disintegrat, delay rel, 1 p.o. daily  13. ramipril 2.5 mg capsule, BID ____________________________ CHIEF COMPLAINTS  Establishing  with [place] cardiologist ____________________________ HISTORY OF PRESENT ILLNESS  Patient seen for cardiac evaluation. The patient has a long-standing history of vascular disease. He had bypass grafting in 1995 by Dr. Tyrone Cummings with a mammary graft to the LAD, a sequential vein graft to right coronary artery in 2 places, a sequential vein graft to the marginal branches and 2 places and a vein graft to the diagonal branch. He was later taken care of by Dr. Aleen Cummings who opened up an occluded vein graft to the right coronary artery in 2006. He later has been takencare of over at [facility] more recently and has had multiple interventions of the saphenous vein graft to  the circumflex as well as the graft to the RCA.  At the current time his vein graft to the diagonal is occluded and  the vein graft to the right coronary artery is occluded. On a fishing trip to [place] in July 2013 he developed problems there and had a vein graft stented to the circumflex. He has had a couple of stents placed at [facility] of this graft also in 2009.  Around 3 weeks ago he had prolonged chest pain and was seen at the [facility] emergency room and then transferred to [facility] where he had catheterization that showed his vein graft to the circumflex to be patent but did his vein grafts to the right coronary artery and diagonal were occluded. The mammary graft was patent. His ejection fraction was estimated at 40%. He was treated medically with Ranexa and he states that he had chest pain for a couple of weeks after that but his chest pain is gone now and he is feeling good and does not have exertional angina. He denies PND, orthopnea or edema.  He also has significant peripheral vascular disease and has significant claudication. He is mainly limited when he walks because of claudication. He had a carotid artery stent placed in the right carotid artery about a year ago for a high grade asymptomatic stenosis.  He does not have any current vascular symptoms.  He is currently in a drug study for treatment of cholesterol.  He wished to move his care to [facility] for cardiology care for convenience now. ____________________________ PAST HISTORY  Past Medical Illnesses:  hyperlipidemia, claudication-BLE, BPH, hypertension, gout, GERD;  Cardiovascular Illnesses:  CAD, peripheral vascular disease, Carotid artery disease;  Surgical Procedures:  CABG, carotid stent, inguinal herniorrhaphy-rt, inguinal herniorrhaphy-left, lt foot surgery, cataract extraction OU, tonsillectomy;  Cardiology Procedures-Invasive:  CABG w LIMA to LAD, SVG to OM1-OM2, SVG to dx, SVG to PD/PL 1995, Cypher stent x 2 SVG to RCA and  Cypher stent to Circ Graft 05/13/07, stent of SVG to circ OM in  [place] in July 2013;  Cardiology Procedures-Noninvasive:  echocardiogram, treadmill cardiolite;  Cardiac Cath Results:  normal Left main, occluded mid LAD, occluded RCA SVG, occluded CFX, widely patent OM 1 SVG, occluded RCA SVG, occluded Diag 1 SVG, widely patent LAD LIMA graft;  Peripheral Vascular Procedures:  carotid stent right 2013 [facility];  LVEF not documented,   ____________________________ [facility] TEST DATES EKG Date:  09/02/2012;  Chest Xray Date: 08/16/2012;   ____________________________ FAMILY HISTORY Father - age 78,  died of myocardial infarction and hyperlipidemia; Mother - age 23,  deceased and ? 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  Integumentary:no rashes or [place] skin lesions. Eyes: wears eye glasses/contact lenses, cataract extraction bilaterally Ears, Nose, Throat, Mouth:  denies any hearing loss, epistaxis, hoarseness or difficulty speaking. Respiratory: mild dyspnea with exertion Cardiovascular:  please review HPI Abdominal: denies dyspepsia, GI bleeding, constipation, or diarrhea Genitourinary-Male: nocturia, erectile dysfunction  Musculoskeletal:  arthritis of the hands Neurological:  denies headaches, stroke, or TIA,. Psychiatric:  denies depession or anxiety. Hematological/Immunologic:  denies any food allergies, bleeding disorders. ____________________________ PHYSICAL EXAMINATION VITAL SIGNS  Blood  Pressure:  114/60 Sitting, Right arm, regular cuff  , 110/60 Standing, Right arm and regular cuff   Pulse:  56/min. Weight:  165.00 lbs. Height:  71"BMI: 23  Constitutional:  pleasant white male in no acute distress Skin:  warm and dry to touch, no apparent skin lesions, or masses noted. Head:  normocephalic, normal hair pattern, no masses or tenderness Eyes:  EOMS Intact, PERRLA, C and S clear, Funduscopic exam not done. ENT:  ears, nose and throat reveal no gross abnormalities.  Dentition good. Neck:  supple, without massess. No JVD, thyromegaly or carotid bruits. Carotid upstroke normal. Chest:  clear to auscultation and percussion, healed median sternotomy scar Cardiac:  regular rhythm, normal S1 and S2, No S3 or S4, no murmurs, gallops or rubs detected. Abdomen:  abdomen soft,non-tender, no masses, no hepatospenomegaly, or aneurysm noted Peripheral Pulses:  femoral pulses 2+, bilateral femoral bruits present, distal pulses diminished Extremities & Back:  well healed saphenous vein donor site RLE, no edema present Neurological:  no gross motor or sensory deficits noted, affect appropriate, oriented x3. ____________________________ IMPRESSIONS/PLAN  1. Coronary artery disease with previous bypass grafting and bypass graft disease with known occlusion of the grafts to these right coronary artery and diagonal and multiple stents placed to the vein graft to the circumflex which is currently patent as of the middle of April.  2. Peripheral vascular disease with claudication 3. Carotid artery disease with previous carotid artery stent 4. Hyperlipidemia under treatment 5. History of hypertension  Recommendations:  We have reviewed extensive records from [facility] and I am in agreement with his current medical regimen. He is not having much in the way of angina at this time. I told him I would see him in 3 months and we discussed activity and exercise  recommendations. ____________________________ TODAYS ORDERS  1. Return Visit: 3 months  2. 12 Lead EKG: Today                       ____________________________ Cardiology Physician:  Darden Palmer MD [facility]

## 2012-09-06 NOTE — H&P (Addendum)
History and Physical   Admit date: 09/06/2012 Name:  Ian Cummings Medical record number: [id number] DOB/Age:  [birth year]  79 y.o. male  Referring Physician:   [facility] Primary Cardiologist: Dr. Viann Fish Primary Physician: Dr. Buren Kos Chief complaint/reason for admission:  Severe chest discomfort  HPI:  This 79 year old male has a prior history of coronary artery disease with bypass grafting in 1995. He developed recurrent saphenous vein graft disease and had stenting of the right coronary artery graft in 2001 by Dr. Aleen Campi and later switched his care to [facility]. From review of the records over there he had multiple stents involving the right coronary graft and circumflex graft in 2009 and later had stenting of the graft to the circumflex while he was on a fishing trip to [place] in 2013. He has had significant angina and he also has had severe limiting claudication with ABI of around 0.53. He recently was admitted with a non-ST elevation myocardial infarction and underwent catheterization that showed a patent mammary graft to LAD, occlusion of his native coronary arteries, occlusion of the vein graft to the right coronary artery, occlusion of the vein graft to the diagonal, and a patent graft to the circumflex. He was treated with Pradaxa and Imdur and when I saw him last Thursday stated that he was no longer having chest pain but had chest pain about 2 weeks after he went home. He went down to [place] to visit relatives this weekend and while there had recurrence of angina and has had increasing frequency and severity of angina. He drove back today and had worsening angina today took 3 nitroglycerin without relief and was advised to come to the emergency room. While sitting in a chair in the examination room, the discomfort got better but has still been present and he is admitted at this time for treatment of unstable angina. He normally goes to cardiac  rehabilitation. Last week he told me that he was limited with claudication in the past has also been limited with angina. He denies PND, orthopnea or edema.   Past Medical History  Diagnosis Date  . CAD (coronary artery disease)   . Hyperlipidemia   . Hypertension   . Arthritis     Gout  . GERD (gastroesophageal reflux disease)   . MI (myocardial infarction) 4/14  . Gout   . BPH (benign prostatic hypertrophy)   . Carotid artery disease     Right carotid stent 2013     Past Surgical History  Procedure Laterality Date  . Coronary artery bypass graft  1995  . Foot surgery    . Coronary angioplasty with stent placement  2010  . Hernia repair      X 2   . Carotid stent insertion  2013    right at [facility]   Allergies: has No Known Allergies.   Medications: Prior to Admission medications   Medication Sig Start Date End Date Taking? Authorizing Provider  allopurinol (ZYLOPRIM) 300 MG tablet Take 300 mg by mouth daily.   Yes Historical Provider, MD  amLODipine (NORVASC) 10 MG tablet Take 10 mg by mouth daily.   Yes Historical Provider, MD  aspirin 81 MG tablet Take 162 mg by mouth 2 (two) times daily.   Yes Historical Provider, MD  atenolol (TENORMIN) 50 MG tablet Take 50 mg by mouth daily.   Yes Historical Provider, MD  cholecalciferol (VITAMIN D) 1000 UNITS tablet Take 2,000 Units by mouth daily.   Yes  Historical Provider, MD  clopidogrel (PLAVIX) 75 MG tablet Take 75 mg by mouth daily.   Yes Historical Provider, MD  fish oil-omega-3 fatty acids 1000 MG capsule Take 1 g by mouth daily.    Yes Historical Provider, MD  hydrochlorothiazide (HYDRODIURIL) 25 MG tablet Take 25 mg by mouth daily.   Yes Historical Provider, MD  isosorbide mononitrate (IMDUR) 60 MG 24 hr tablet Take 60 mg by mouth 2 (two) times daily.    Yes Historical Provider, MD  lansoprazole (PREVACID) 15 MG capsule Take 15 mg by mouth daily.   Yes Historical Provider, MD  multivitamin (RENA-VIT) TABS tablet  Take 1 tablet by mouth daily.   Yes Historical Provider, MD  nitroGLYCERIN (NITROSTAT) 0.4 MG SL tablet Place 0.4 mg under the tongue every 5 (five) minutes as needed.   Yes Historical Provider, MD  PARoxetine (PAXIL) 20 MG tablet Take 20 mg by mouth every morning.   Yes Historical Provider, MD  ramipril (ALTACE) 2.5 MG tablet Take 2.5 mg by mouth daily.   Yes Historical Provider, MD  ranolazine (RANEXA) 500 MG 12 hr tablet Take 500 mg by mouth 2 (two) times daily.   Yes Historical Provider, MD  rosuvastatin (CRESTOR) 40 MG tablet Take 40 mg by mouth daily.   Yes Historical Provider, MD  vitamin B-12 (CYANOCOBALAMIN) 1000 MCG tablet Take 1,000 mcg by mouth daily.   Yes Historical Provider, MD    Family History:  Family Status  Relation Status Death Age  . Father Deceased 27    died of MI   . Mother Deceased 85    died of CVA  . Sister Deceased 22    died of CVA  . Brother Alive   . Sister Alive     History of CAD and pacemaker    Social History:   reports that he quit smoking about 37 years ago. His smoking use included Cigarettes.  He has never used smokeless tobacco. He reports that  drinks alcohol. He reports that he does not use illicit drugs.   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 He does not have any GI bleeding or symptoms of peptic ulcer disease. He had some Doppler plate of the percent dictated a neurologic examination has a previous carotid artery stent for asymptomatic right carotid artery disease. Other than as noted above, the remainder of the review of systems is normal  Physical Exam: BP 127/55  Pulse 56  Temp(Src) 98.6 F (37 C) (Oral)  Resp 18  SpO2 99% BP 127/55  Pulse 56  Temp(Src) 98.6 F (37 C) (Oral)  Resp 18  SpO2 99% General appearance: alert, cooperative, appears stated age and no distress Head: Normocephalic, without obvious abnormality,  atraumatic Eyes: conjunctivae/corneas clear. PERRL, EOM's intact. Fundi not examined Neck: no adenopathy, no carotid bruit, no JVD and supple, symmetrical, trachea midline Lungs: clear to auscultation bilaterally Heart: regular rate and rhythm, S1, S2 normal, no murmur, click, rub or gallop Abdomen: soft, non-tender; bowel sounds normal; no masses,  no organomegaly Rectal: deferred Extremities: extremities normal, atraumatic, no cyanosis or edema Pulses: Femoral pulses are 2+ with bilateral bruits, peripheral pulses are diminished. Skin: Skin color, texture, turgor normal. No rashes or lesions Neurologic: Grossly normal   Labs: CBC  Recent Labs  09/06/12 1507  WBC 6.8  RBC 4.22  HGB 13.1  HCT 37.4*  PLT 161  MCV 88.6  MCH 31.0  MCHC 35.0  RDW 14.1   CMP   Recent Labs  09/06/12 1507  NA 139  K 4.0  CL 104  CO2 25  GLUCOSE 101*  BUN 32*  CREATININE 1.34  CALCIUM 9.7  GFRNONAA 48*  GFRAA 56*   BNP (last 3 results)  Recent Labs  08/16/12 1829  PROBNP 351.1    Troponin (Point of Care Test)  Recent Labs  09/06/12 1533  TROPIPOC 0.00   EKG: Sinus rhythm, nonspecific ST changes   Radiology: Not done this admission but clear back in April    IMPRESSIONS: 1. Unstable angina pectoris 2. Coronary artery bypass graft disease with previous occlusion of the diagonal and right coronary artery grafts and previous stents of the circumflex graft 3. Peripheral vascular disease with claudication 4. Carotid artery disease with previous carotid artery stent 5. Hyperlipidemia under treatment currently on investigational protocol 6. Hypertensive heart disease 7. History of gout 8. BPH  PLAN: He'll be placed on telemetry and have serial cardiac enzymes. Anticoagulation, intravenous nitroglycerin tried pain-free. Request records from [facility] to review recent catheterization to determine if could be a candidate for repeat bypass grafting  Signed: W.  Ashley Royalty MD [facility] Cardiology  09/06/2012, 5:02 PM

## 2012-09-07 LAB — TROPONIN I: Troponin I: <0.3 ng/mL (ref <0.30)

## 2012-09-07 NOTE — Progress Notes (Signed)
Courier called to pick up films from [facility] and to deliver to [facility] cath lab ASAP; [facility] cath lab called and made aware

## 2012-09-07 NOTE — Clinical Documentation Improvement (Signed)
CHF DOCUMENTATION CLARIFICATION QUERY  THIS DOCUMENT IS NOT A PERMANENT PART OF THE MEDICAL RECORD  TO RESPOND TO THE THIS QUERY, FOLLOW THE INSTRUCTIONS BELOW:  1. If needed, update documentation for the patient's encounter via the notes activity.  2. Access this query again and click edit on the In [employer].  3. After updating, or not, click F2 to complete all highlighted (required) fields concerning your review. Select "additional documentation in the medical record" OR "no additional documentation provided".  4. Click Sign note button.  5. The deficiency will fall out of your In Basket *Please let us know if you are not able to complete this workflow by phone or e-mail (listed below).  Please update your documentation within the medical record to reflect your response to this query.                                                                                    09/07/12  Dear Dr. Eden Emms,   In a better effort to capture your patient's severity of illness, reflect appropriate length of stay and utilization of resources, a review of the patient medical record has revealed the diagnosis of Heart Failure.  PLEASE CLARIFY ACUITY OF CHF. THANK YOU FOR YOUR RESPONSE.  Possible Clinical Conditions? - Chronic Diastolic Congestive Heart Failure - Acute Diastolic Congestive Heart Failure - Acute on Chronic Diastolic Congestive Heart Failure - Other Condition  Reviewed:  no additional documentation provided  Thank You,  Beverley Fiedler RN BSN Clinical Documentation Specialist: Tele Contact:  [phone]  Health Information Management [place]

## 2012-09-07 NOTE — Progress Notes (Signed)
Utilization review completed.  

## 2012-09-07 NOTE — Progress Notes (Signed)
Called [facility] to obtain cath lab films, was told medical records is closed at this time and that cath lab in on-call; will attempt to call back when open

## 2012-09-07 NOTE — Progress Notes (Signed)
Subjective:  Chest pain improved overnight.  Currently pain free.  Objective:  Vital Signs in the last 24 hours: BP 128/63  Pulse 60  Temp(Src) 97.7 F (36.5 C) (Oral)  Resp 18  Ht 5\' 11"  (1.803 m)  Wt 73.846 kg (162 lb 12.8 oz)  BMI 22.72 kg/m2  SpO2 96%  Physical Exam: Pleasant WM in NAD Lungs:  Clear  Cardiac:  Regular rhythm, normal S1 and S2, no S3 Abdomen:  Soft, nontender, no masses Extremities:  No edema present  Intake/Output from previous day: 05/12 0701 - 05/13 0700 In: -  Out: 950 [Urine:950]  Weight Filed Weights   09/06/12 1825 09/07/12 0612  Weight: 74.753 kg (164 lb 12.8 oz) 73.846 kg (162 lb 12.8 oz)    Lab Results: Basic Metabolic Panel:  Recent Labs  40/98/11 1507 09/06/12 1716  NA 139 138  K 4.0 4.0  CL 104 105  CO2 25 22  GLUCOSE 101* 91  BUN 32* 32*  CREATININE 1.34 1.25   CBC:  Recent Labs  09/06/12 1507  WBC 6.8  HGB 13.1  HCT 37.4*  MCV 88.6  PLT 161   Cardiac Enzymes:  Recent Labs  09/06/12 1718 09/06/12 2250 09/07/12 0513  TROPONINI <0.30 <0.30 <0.30    Telemetry: sinus  Assessment/Plan:  1. Unstable angina 2. 3V CAD with prior graft disease 3. PVD 4. Hypertension  Rec:  Taper and d/c IV NTG, Await films from [facility] and review.  May discuss with Dr. Tyrone Sage.    Darden Palmer  MD [facility] Cardiology  09/07/2012, 11:54 AM

## 2012-09-08 ENCOUNTER — Other Ambulatory Visit: Payer: Self-pay | Admitting: *Deleted

## 2012-09-08 DIAGNOSIS — I2510 Atherosclerotic heart disease of native coronary artery without angina pectoris: Secondary | ICD-10-CM

## 2012-09-08 MED ORDER — MAGNESIUM HYDROXIDE 400 MG/5ML PO SUSP
30.0000 mL | Freq: Every day | ORAL | Status: DC | PRN
  Administered 2012-09-08: 30 mL via ORAL
  Filled 2012-09-08: qty 30

## 2012-09-08 MED ORDER — ASPIRIN 81 MG PO TABS: 162.0000 mg | ORAL_TABLET | Freq: Every day | ORAL | Status: DC

## 2012-09-08 MED ORDER — ASPIRIN 81 MG PO CHEW
162.0000 mg | CHEWABLE_TABLET | Freq: Every day | ORAL | Status: DC
  Administered 2012-09-08: 162 mg via ORAL
  Filled 2012-09-08: qty 2

## 2012-09-08 NOTE — Progress Notes (Addendum)
I had originally prepared a discharge summary for today and he was planned to be discharged, however he had recurrent pain on ambulation.   Patient had recurrent pain on ambulation this am and wants to stay in house.  He is on max medical therapy and I have asked Dr. Tyrone Sage to see in consult re possibility of repeat CABG if continued angina.  Ian Palmer MD [facility]

## 2012-09-08 NOTE — Progress Notes (Signed)
Called [facility] to get cath lab films sent over to our cath lab; medical release of records was faxed to hospital; Medical records employee stated it would take a few days to get here

## 2012-09-08 NOTE — Discharge Summary (Addendum)
Physician Discharge Summary  Patient ID: Ian Cummings MRN: [id number] DOB/AGE: [birth year] 79 y.o.  Admit date: 09/06/2012 Discharge date: 09/08/2012  Primary Physician:   Dr. Buren Kos  Primary Discharge Diagnosis: 1. Unstable angina pectoris  Secondary Discharge Diagnosis: 2. Coronary artery disease with previous bypass grafting with saphenous vein graft disease involving the graft to the right coronary artery and diagonal which are occluded and loss of the limb of the graft to the distal marginal circulation 3. Peripheral vascular disease with claudication 4. Hypertension controlled 5. Hyperlipidemia 7. Carotid artery disease with previous right carotid artery stenting 8. History of gout 9. BPH  Hospital Course: This 79 year old male has a prior history of coronary artery disease with bypass grafting in 1995. He developed recurrent saphenous vein graft disease and had stenting of the right coronary artery graft in 2007 by Dr. Aleen Campi and later switched his care to [facility]. From review of the records over there he had multiple stents involving the right coronary graft and circumflex graft in 2009 and later had stenting of the graft to the circumflex while he was on a fishing trip to [place] in 2013. He has had significant angina and he also has had severe limiting claudication with ABI of around 0.53. He recently was admitted with a non-ST elevation myocardial infarction and underwent catheterization that showed a patent mammary graft to LAD, occlusion of his native coronary arteries, occlusion of the vein graft to the right coronary artery, occlusion of the vein graft to the diagonal, and a patent graft to the circumflex. He was treated with Ranexa and Imdur and when I saw him last Thursday stated that he was no longer having chest pain but had chest pain about 2 weeks after he went home. He went down to [place] to visit relatives this weekend and while there had recurrence  of angina and has had increasing frequency and severity of angina. He drove back today and had worsening angina today took 3 nitroglycerin without relief and was advised to come to the emergency room. While sitting in a chair in the examination room, the discomfort got better but has still been present and he is admitted at this time for treatment of unstable angina. He was previously involved in cardiac rehabilitation in February. Last week he told me that he was limited with claudication in the past has also been limited with angina. He denies PND, orthopnea or edema.  The patient was seen in the emergency room and having ongoing chest pain. There was mild ST depression in the anterior leads. Troponins were negative. He was started on IV nitroglycerin as well as subcutaneous enoxaparin  and became pain-free. His catheterization films requested from [facility] and were reviewed. I asked Dr. Tyrone Sage who did his original surgery to review the films to determine if he would be a candidate for repeat surgery. He had an occluded right coronary artery with faint collaterals noted to this vessel and appeared to be occluded distal limb of the graft to the marginal branch.  He was due to be discharged on the 14th but on the way out of the hospital had a recurrent episode of chest discomfort and we kept him in the hospital. He was seen in consultation by Dr. Ofilia Neas who was awaiting the results of the catheterization films will ask but was concerned about the quality of the distal vessels for redo bypass grafting.  The patient was pain-free the morning of discharge and is now discharged in  improved condition. He will follow up with me in one week and is to resume his normal medications. I asked him to be seen by cardiac rehabilitation and he will also get involved in cardiovascular rehabilitation.   Discharge Exam: Blood pressure 113/49, pulse 51, temperature 97.4 F (36.3 C), temperature source Oral,  resp. rate 16, height 5\' 11"  (1.803 m), weight 73.846 kg (162 lb 12.8 oz), SpO2 96.00%.   Lungs clear, no S3.  Labs: CBC:   Lab Results  Component Value Date   WBC 6.8 09/06/2012   HGB 13.1 09/06/2012   HCT 37.4* 09/06/2012   MCV 88.6 09/06/2012   PLT 161 09/06/2012   CMP:  Recent Labs Lab 09/06/12 1716  NA 138  K 4.0  CL 105  CO2 22  BUN 32*  CREATININE 1.25  CALCIUM 9.7  PROT 7.3  BILITOT 0.4  ALKPHOS 39  ALT 25  AST 26  GLUCOSE 91   Lipid Panel     Component Value Date/Time   CHOL  Value: 109        ATP III CLASSIFICATION:  <200     mg/dL   Desirable  161-096  mg/dL   Borderline High  >=045    mg/dL   High        40/98/1191 0241   TRIG 114 04/18/2009 0241   HDL 34* 04/18/2009 0241   CHOLHDL 3.2 04/18/2009 0241   VLDL 23 04/18/2009 0241   LDLCALC  Value: 52        Total Cholesterol/HDL:CHD Risk Coronary Heart Disease Risk Table                     Men   Women  1/2 Average Risk   3.4   3.3  Average Risk       5.0   4.4  2 X Average Risk   9.6   7.1  3 X Average Risk  23.4   11.0        Use the calculated Patient Ratio above and the CHD Risk Table to determine the patient's CHD Risk.        ATP III CLASSIFICATION (LDL):  <100     mg/dL   Optimal  478-295  mg/dL   Near or Above                    Optimal  130-159  mg/dL   Borderline  621-308  mg/dL   High  >657     mg/dL   Very High 84/69/6295 0241   Cardiac Enzymes:  Recent Labs  09/06/12 1718 09/06/12 2250 09/07/12 0513  TROPONINI <0.30 <0.30 <0.30   EKG: Sinus rhythm, mild ST depression in the anterolateral leads which is unchanged from an EKG last week.  Discharge Medications:   Medication List    TAKE these medications       allopurinol 300 MG tablet  Commonly known as:  ZYLOPRIM  Take 300 mg by mouth daily.     amLODipine 10 MG tablet  Commonly known as:  NORVASC  Take 10 mg by mouth daily.     aspirin 81 MG tablet  Take 162 mg by mouth 2 (two) times daily.     atenolol 50 MG tablet  Commonly  known as:  TENORMIN  Take 50 mg by mouth daily.     cholecalciferol 1000 UNITS tablet  Commonly known as:  VITAMIN D  Take 2,000 Units by mouth daily.  clopidogrel 75 MG tablet  Commonly known as:  PLAVIX  Take 75 mg by mouth daily.     fish oil-omega-3 fatty acids 1000 MG capsule  Take 1 g by mouth daily.     hydrochlorothiazide 25 MG tablet  Commonly known as:  HYDRODIURIL  Take 25 mg by mouth daily.     isosorbide mononitrate 60 MG 24 hr tablet  Commonly known as:  IMDUR  Take 60 mg by mouth 2 (two) times daily.     lansoprazole 15 MG capsule  Commonly known as:  PREVACID  Take 15 mg by mouth daily.     multivitamin Tabs tablet  Take 1 tablet by mouth daily.     nitroGLYCERIN 0.4 MG SL tablet  Commonly known as:  NITROSTAT  Place 0.4 mg under the tongue every 5 (five) minutes as needed.     PARoxetine 20 MG tablet  Commonly known as:  PAXIL  Take 20 mg by mouth every morning.     ramipril 2.5 MG tablet  Commonly known as:  ALTACE  Take 2.5 mg by mouth daily.     ranolazine 500 MG 12 hr tablet  Commonly known as:  RANEXA  Take 500 mg by mouth 2 (two) times daily.     rosuvastatin 40 MG tablet  Commonly known as:  CRESTOR  Take 40 mg by mouth daily.     vitamin B-12 1000 MCG tablet  Commonly known as:  CYANOCOBALAMIN  Take 1,000 mcg by mouth daily.        Followup plans and appointments: See Dr. Donnie Aho in week call for appointment  Time spent with patient to include physician time: 30 minutes   Signed: W. Ashley Royalty. MD [facility] 09/08/2012, 8:41 AM

## 2012-09-08 NOTE — Progress Notes (Signed)
D/c orders received;IV removed with gauze on, pt remains in stable condition, pt meds and instructions reviewed and given to pt; pt d/c to home 

## 2012-09-08 NOTE — Progress Notes (Signed)
Pt c/o pain under L shoulder blade, rates 1/10, non-radiating; BP 105/56, HR 53 SB; 1 SL NTG given with some relief; pts BP after 94/45, will continue to monitor

## 2012-09-08 NOTE — Progress Notes (Signed)
Pt has decided to stay; still currently pain free; Dr.Tilley called to be made aware of pts decision

## 2012-09-08 NOTE — Consult Note (Signed)
Subjective:   Patient is a 79 y.o. male with known history of CAD.  He is S/P CABG performed by me in 1995.  Since that time the patient has developed further disease progression involving his saphenous vein grafts and native coronary arteries.  The patient states that he has had 10 stents placed to his RCA, RCA graft and Left Circumflex graft.  Despite these interventions the patient has continued to experience angina.  He states these episodes have become more frequent but do improve with use of Nitroglycerin.  Approximately 2 weeks ago he presented to the Emergency Department at [facility] with chest pain and was found to have a NSTEMI.  Subsequent catheterization showed a patent LIMA to LAD, however his native coronaries and saphenous vein grafts were all occluded.  It was felt no intervention could be offered at that time and the patient was treated with medical management.  Patient states initially his angina symptoms improved.  However over this past weekend the patient traveled to [place] to visit with family and developed recurrent angina with increasing frequency.  He took NTG which did not provide any relief.  Once patient arrived back to [facility] he presented to [facility] Emergency Department for further evaluation.  Cardiac enzymes were negative.  EKG showed some mild ST depression in the anterior leads.  He was felt to have unstable angina and was admitted and placed on a Heparin drip and Lovenox therapy.  Since admission patient had remained chest pain free.  His cardiac catheterization films were reviewed by Dr. Donnie Aho, who requested TCTS consultation  to see if patient would be a surgical candidate.  It was originally felt the patient would be discharged home and follow up with me  on an outpatient basis.  However, while patient was ambulating in the hallway this morning he again developed angina symptoms and felt he should stay in the hospital because he can not continue  to report to the ED every time these episodes do not resolve.  Currently the patient is chest pain free.   Patient Active Problem List   Diagnosis Date Noted  . CAD (coronary artery disease) 09/06/2012  . Unstable angina pectoris 09/06/2012  . s/p CABG   . Hyperlipidemia   . Essential hypertension   . Gout   . BPH (benign prostatic hypertrophy)   . 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 Carotid artery disease without cerebral infarction    Past Medical History  Diagnosis Date  . CAD (coronary artery disease)   . Hyperlipidemia   . Hypertension   . Arthritis     Gout  . GERD (gastroesophageal reflux disease)   . MI (myocardial infarction) 4/14  . Gout   . BPH (benign prostatic hypertrophy)   . Carotid artery disease     Right carotid stent 2013    Past Surgical History  Procedure Laterality Date  . Coronary artery bypass graft  1995  . Foot surgery    . Coronary angioplasty with stent placement  2010  . Hernia repair      X 2   . Carotid stent insertion  2013    right at [facility]    Prescriptions prior to admission  Medication Sig Dispense Refill  . allopurinol (ZYLOPRIM) 300 MG tablet Take 300 mg by mouth daily.      Marland Kitchen amLODipine (NORVASC) 10 MG tablet Take 10 mg by mouth daily.      Marland Kitchen  atenolol (TENORMIN) 50 MG tablet Take 50 mg by mouth daily.      . cholecalciferol (VITAMIN D) 1000 UNITS tablet Take 2,000 Units by mouth daily.      . clopidogrel (PLAVIX) 75 MG tablet Take 75 mg by mouth daily.      . fish oil-omega-3 fatty acids 1000 MG capsule Take 1 g by mouth daily.       . hydrochlorothiazide (HYDRODIURIL) 25 MG tablet Take 25 mg by mouth daily.      . isosorbide mononitrate (IMDUR) 60 MG 24 hr tablet Take 60 mg by mouth 2 (two) times daily.       . lansoprazole (PREVACID) 15 MG capsule Take 15 mg by mouth daily.      . multivitamin (RENA-VIT) TABS tablet Take 1 tablet by mouth daily.      .  nitroGLYCERIN (NITROSTAT) 0.4 MG SL tablet Place 0.4 mg under the tongue every 5 (five) minutes as needed.      Marland Kitchen PARoxetine (PAXIL) 20 MG tablet Take 20 mg by mouth every morning.      . ramipril (ALTACE) 2.5 MG tablet Take 2.5 mg by mouth daily.      . ranolazine (RANEXA) 500 MG 12 hr tablet Take 500 mg by mouth 2 (two) times daily.      . rosuvastatin (CRESTOR) 40 MG tablet Take 40 mg by mouth daily.      . vitamin B-12 (CYANOCOBALAMIN) 1000 MCG tablet Take 1,000 mcg by mouth daily.      . [DISCONTINUED] aspirin 81 MG tablet Take 162 mg by mouth 2 (two) times daily.       No Known Allergies  History  Substance Use Topics  . Smoking status: Former Smoker    Types: Cigarettes    Quit date: 04/29/1975  . Smokeless tobacco: Never Used  . Alcohol Use: Yes     Comment: Occasional drink    Family History  Problem Relation Age of Onset  . Heart disease Mother   . Hyperlipidemia Father   . Hypertension Father   . Heart disease Father     Review of Systems Constitutional: negative for chills and fevers Respiratory: negative for dyspnea on exertion and wheezing Cardiovascular: positive for chest pain, chest pressure/discomfort and exertional chest pressure/discomfort Gastrointestinal: negative Hematologic/lymphatic: negative Neurological: negative  Objective:   Patient Vitals for the past 8 hrs:  BP Pulse  09/08/12 1025 138/63 mmHg 56   BP 138/63  Pulse 56  Temp(Src) 97.4 F (36.3 C) (Oral)  Resp 16  Ht 5\' 11"  (1.803 m)  Wt 162 lb 12.8 oz (73.846 kg)  BMI 22.72 kg/m2  SpO2 96% General appearance: alert, cooperative and no distress Head: Normocephalic, without obvious abnormality, atraumatic Eyes: conjunctivae/corneas clear. PERRL, EOM's intact. Fundi benign. Lungs: clear to auscultation bilaterally Chest wall: no tenderness, previous sternotomy incision Heart: regular rate and rhythm, S1, S2 normal, no murmur, click, rub or gallop Abdomen: soft, non-tender; bowel sounds  normal; no masses,  no organomegaly Extremities: normal. previous open saphenous vein harvest right leg Skin: Skin color, texture, turgor normal. No rashes or lesions Neurologic: Grossly normal  Cath films from [facility] from [place] are reviewed. Old films from from [place] are not viewable yet.   The heavily stented right vein graft is totally occluded, the native right coronary artery is also totally occluded with very faint distal collateral filling. The mammary artery is intact, on injection of the native vessels the distal potential targets  appear very small.  A/P:  1. Coronary Artery Disease- S/P CABG in 1995, multiple PCI with stent placement, recent NSTEMI. From the current films are I have reviewed it would be difficult to recommend redo coronary artery bypass grafting as a solution to the patient's angina because of the report distal target. I discussed this with the patient and explained the anatomy to him. Further recent films done last summer and [place] and older films done at [facility] could be further reviewed. From the current review would not recommend redo coronary artery bypass grafting because of the poor distal targets. From a medical standpoint in spite of the patient's age of 22 he is otherwise in good medical condition.

## 2012-09-08 NOTE — Progress Notes (Signed)
Pt coming out door to walk on my arrival. [place] with him to discuss CRPII. While walking pt stated that he had a pain in his scapula. Sts he is not sure if it is angina or not. Present before walk, did not change with walking. Sts he will notify RN if intensifies. Discussed CRPII and will send referral to [place]. Reviewed NTG usage. Ethelda Chick CES, ACSM 9:38 AM 09/08/2012

## 2012-09-08 NOTE — Progress Notes (Signed)
Pt c/o pain starting on L side shoulder blade, going up to L jaw and L arm pit; pt states he did not want EKG done; BP 154/73, 3 SL NTG given to pt, pt is currently CP free; Dr.Tilley paged and made aware; stated that Dr.Gerheardt still needed to look at films and that pt could go home if he felt comfortable enough to go home. Pt made aware.Pt waiting for wife to make decision if he wants to go or stay, will continue to monitor

## 2012-09-09 ENCOUNTER — Inpatient Hospital Stay (HOSPITAL_COMMUNITY): Payer: Medicare PPO

## 2012-09-09 LAB — PULMONARY FUNCTION TEST

## 2012-09-09 MED ORDER — ALBUTEROL SULFATE (5 MG/ML) 0.5% IN NEBU
2.5000 mg | INHALATION_SOLUTION | Freq: Once | RESPIRATORY_TRACT | Status: AC
  Administered 2012-09-09: 2.5 mg via RESPIRATORY_TRACT

## 2012-09-10 NOTE — ED Provider Notes (Signed)
Medical screening examination/treatment/procedure(s) were performed by non-physician practitioner and as supervising physician I was immediately available for consultation/collaboration.  Damarko Stitely T Vilda Zollner, MD 09/10/12 0705 

## 2012-09-16 ENCOUNTER — Encounter: Payer: Self-pay | Admitting: Cardiology

## 2012-09-16 DIAGNOSIS — I2510 Atherosclerotic heart disease of native coronary artery without angina pectoris: Secondary | ICD-10-CM

## 2012-09-16 NOTE — Progress Notes (Unsigned)
Patient ID: Ian Cummings, male   DOB: 1928-05-13, 79 y.o.   MRN: [id number]  Ian Cummings, Ian Cummings  Date of visit:  09/16/2012 DOB:  March 13, 1929    Age:  79 yrs. Medical record number:  [id number]     Account number:  [id number] Primary Care Provider: Buren Kos ____________________________ CURRENT DIAGNOSES  1. CAD. Bypass graft  2. CAD,Native  3. Hyperlipidemia  4. Carotid Artery Stenosis  5. Peripheral Vascular Disease  6. Surgery-Aortocoronary Bypass Grafting ____________________________ ALLERGIES  NKDA ____________________________ MEDICATIONS  1. Ranexa 500 mg tablet extended release 12 hr, BID  2. allopurinol 300 mg tablet, 1 p.o. daily  3. aspirin 81 mg tablet,chewable, 2 bid  4. multivitamin tablet, 1 p.o. daily  5. Vitamin D3 1,000 unit tablet, 1 p.o. daily  6. amlodipine 10 mg tablet, 1 p.o. daily  7. atenolol 50 mg tablet, 1 p.o. daily  8. Crestor 40 mg tablet, 1 p.o. daily  9. clopidogrel 75 mg tablet, 1 p.o. daily  10. hydrochlorothiazide 25 mg tablet, 1 p.o. daily  11. isosorbide mononitrate 60 mg tablet extended release 24 hr, 1 p.o. daily  12. lansoprazole 15 mg tablet,disintegrat, delay rel, 1 p.o. daily  13. ramipril 2.5 mg capsule, BID ____________________________ CHIEF COMPLAINTS  Followup of CAD,Native ____________________________ HISTORY OF PRESENT ILLNESS  Patient seen for cardiac followup. He was admitted to the hospital with worsening angina and required intravenous heparin. I obtained his catheterization films from [facility] and this films were reviewed by Dr. Tyrone Sage who felt that he was not a candidate for repeat bypass grafting on the basis of the quality of the distal target vessels although the quality of the x-rays was not good. At that point opted to obtain the old films from [place], [place] as well as the old catheterization films from [place] to further look at the quality of the distal vessels. He has had 6 days since going home but didn't develop  recurrent angina this morning. He does have some claudication. He has no PND, orthopnea or edema. Dr. Tyrone Sage otherwise felt he would be a good candidate for redo grafting if his distal vessels were reasonable. ____________________________ PAST HISTORY  Past Medical Illnesses:  hyperlipidemia, claudication-BLE, BPH, hypertension, gout, GERD;  Cardiovascular Illnesses:  CAD, peripheral vascular disease, Carotid artery disease;  Surgical Procedures:  CABG, carotid stent, inguinal herniorrhaphy-rt, inguinal herniorrhaphy-left, lt foot surgery, cataract extraction OU, tonsillectomy;  Cardiology Procedures-Invasive:  CABG w LIMA to LAD, SVG to OM1-OM2, SVG to dx, SVG to PD/PL 1995, Cypher stent x 2 SVG to RCA and Cypher stent to Circ Graft 05/13/07, stent of SVG to circ OM in  [place] in July 2013;  Cardiology Procedures-Noninvasive:  echocardiogram, treadmill cardiolite;  Cardiac Cath Results:  normal Left main, occluded mid LAD, occluded RCA SVG, occluded CFX, widely patent OM 1 SVG, occluded RCA SVG, occluded Diag 1 SVG, widely patent LAD LIMA graft;  Peripheral Vascular Procedures:  carotid stent right 2013 [facility];  LVEF not documented,   ____________________________ Jacqulyn Bath TEST DATES EKG Date:  09/02/2012;  Chest Xray Date: 08/16/2012;   ____________________________ SOCIAL HISTORY Alcohol Use:  wine 1-2 per day;  Smoking:  used to smoke but quit Prior to 1980;  Diet:  low sugar;  Lifestyle:  married;  Exercise:  some exercise;  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  Integumentary:no rashes or new skin lesions.  Respiratory: mild dyspnea with exertion Cardiovascular:  please review HPI Abdominal: denies dyspepsia, GI bleeding, constipation, or diarrhea Genitourinary-Male: nocturia, erectile dysfunction  Musculoskeletal:  arthritis  of the hands Neurological:  denies headaches, stroke, or TIA  ____________________________ PHYSICAL EXAMINATION VITAL SIGNS  Blood Pressure:  90/60 Sitting, Right arm, regular cuff  , 92/60 Standing, Right arm and regular cuff   Pulse:  56/min. Weight:  166.00 lbs. Height:  71"BMI: 23  Constitutional:  pleasant white male in no acute distress Skin:  warm and dry to touch, no apparent skin lesions, or masses noted. Head:  normocephalic, normal hair pattern, no masses or tenderness Neck:  supple, without massess. No JVD, thyromegaly or carotid bruits. Carotid upstroke normal. Chest:  clear to auscultation and percussion, healed median sternotomy scar Abdomen:  abdomen soft,non-tender, no masses, no hepatospenomegaly, or aneurysm noted Peripheral Pulses:  femoral pulses 2+, bilateral femoral bruits present, distal pulses diminished Extremities & Back:  well healed saphenous vein donor site RLE, no edema present Neurological:  no gross motor or sensory deficits noted, affect appropriate, oriented x3. ____________________________ IMPRESSIONS/PLAN  1. Coronary artery disease with bypass graft disease and class 2-3 angina 2. Peripheral vascular disease with claudication 3. Hyperlipidemia under treatment  Recommendations:  Await films from both [facility] as well as old films from [facility] We are to see if maybe the targets were better visualized on those films. Continue current medical regimen and return in 6 weeks. ____________________________ TODAYS ORDERS  1. Return Visit: 6 weeks                       ____________________________ Cardiology Physician:  Darden Palmer MD [facility]

## 2012-09-21 ENCOUNTER — Encounter: Payer: Self-pay | Admitting: Cardiothoracic Surgery

## 2012-09-21 ENCOUNTER — Ambulatory Visit (INDEPENDENT_AMBULATORY_CARE_PROVIDER_SITE_OTHER): Payer: Medicare PPO | Admitting: Cardiothoracic Surgery

## 2012-09-21 VITALS — BP 109/58 | HR 52 | Resp 18 | Ht 71.0 in | Wt 162.0 lb

## 2012-09-21 DIAGNOSIS — Z951 Presence of aortocoronary bypass graft: Secondary | ICD-10-CM

## 2012-09-21 DIAGNOSIS — I2510 Atherosclerotic heart disease of native coronary artery without angina pectoris: Secondary | ICD-10-CM

## 2012-09-21 DIAGNOSIS — I209 Angina pectoris, unspecified: Secondary | ICD-10-CM

## 2012-09-21 NOTE — Progress Notes (Signed)
[address]            [place] [address]          [phone]      Perri Lamagna [facility] Medical Record #[id number] Date of Birth: February 17, 1929  Referring: Othella Boyer, MD Primary Care: Kari Baars, MD  Chief Complaint:    Chief Complaint  Patient presents with  . Coronary Artery Disease    Surgical eval for a re-do CABG    History of Present Illness:    Patient is a 79 y.o. male with known history of CAD. He is S/P CABG performed by me in 1995 at that time he had coronary artery bypass grafting x7 with the left internal mammary to the left anterior descending coronary artery reverse saphenous vein graft to the second diagonal sequential reverse saphenous vein graft to the first diagonal and distal circumflex triple reverse saphenous vein graft to the posterior descending posterior lateral branch one and posterior lateral branch 2 of the right coronary artery. Since that time the patient has developed further disease progression involving his saphenous vein grafts and native coronary arteries. The patient states that he has had 10 stents placed to his RCA, RCA graft and Left Circumflex graft. Despite these interventions the patient has continued to experience angina. He states these episodes have become more frequent but do improve with use of Nitroglycerin. Approximately 3 weeks ago he presented to the Emergency Department at [facility] with chest pain and was found to have a NSTEMI. Subsequent catheterization showed a patent LIMA to LAD, however his native coronaries and saphenous vein grafts to the diagonal and right coronary grafts, the graft to the distal circumflex was opened. It was felt no intervention could be offered at that time and the patient was treated with medical management. Patient states initially his angina symptoms improved. After his catheterization at [facility] he  traveled to [place] to visit with family and  developed recurrent angina with increasing frequency. He took NTG which did not provide any relief. Once patient arrived back to [facility] he presented to [facility] Emergency Department for further evaluation. Cardiac enzymes were negative. EKG showed some mild ST depression in the anterior leads. He was felt to have unstable angina and was admitted and placed on a Heparin drip and Lovenox therapy. At that time the films from [facility] were reviewed with total occlusion of the old heavily stented vein graft to the right, with poor distal flow. The question of distal disease in suitable targets was not resolved by the films. Since seen at the hospital the patient's symptoms have improved to some degree, he has had some occasional angina. He was able to do yard work without episodes of angina at the end of last week.  Since he was in the hospital the cath films from [facility] when he was cath last summer reviewed. At that time the vein graft to the right system was opened with an ostial stenosis, there was extensive filling of the right system with adequate targets for repeat bypass surgery in the right system.    Current Activity/ Functional Status:  Patient is independent with mobility/ambulation, transfers, ADL's, IADL's.  Zubrod Score: At the time of surgery this patient's most appropriate activity status/level should be described as: []  Normal activity, no symptoms [x]  Symptoms, fully ambulatory []  Symptoms, in bed less than or equal  to 50% of the time []  Symptoms, in bed greater than 50% of the time but less than 100% []  Bedridden []  Moribund   Past Medical History  Diagnosis Date  . CAD (coronary artery disease)   . Hyperlipidemia   . Hypertension   . Arthritis     Gout  . GERD (gastroesophageal reflux disease)   . MI (myocardial infarction) 4/14  . Gout   . BPH (benign prostatic hypertrophy)   . Carotid artery disease     Right carotid stent 2013    Past  Surgical History  Procedure Laterality Date  . Coronary artery bypass graft  1995  . Foot surgery    . Coronary angioplasty with stent placement  2010  . Hernia repair      X 2   . Carotid stent insertion  2013    right at [facility]    Family History  Problem Relation Age of Onset  . Heart disease Mother   . Hyperlipidemia Father   . Hypertension Father   . Heart disease Father     History   Social History  . Marital Status: Married    Spouse Name: N/A    Number of Children: N/A  . Years of Education: N/A   Occupational History  . Not on file.   Social History Main Topics  . Smoking status: Former Smoker    Types: Cigarettes    Quit date: 04/29/1975  . Smokeless tobacco: Never Used  . Alcohol Use: Yes     Comment: Occasional drink  . Drug Use: No  . Sexually Active: Not on file   Other Topics Concern  . 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 Former Smoker  . Types: Cigarettes  . Quit date: 04/29/1975  Smokeless tobacco  . Never Used    History  Alcohol Use  . Yes    Comment: Occasional drink     No Known Allergies  Current Outpatient Prescriptions  Medication Sig Dispense Refill  . allopurinol (ZYLOPRIM) 300 MG tablet Take 300 mg by mouth daily.      Marland Kitchen amLODipine (NORVASC) 10 MG tablet Take 10 mg by mouth daily.      Marland Kitchen aspirin 81 MG tablet Take 2 tablets (162 mg total) by mouth daily.  30 tablet  12  . atenolol (TENORMIN) 50 MG tablet Take 50 mg by mouth daily.      . cholecalciferol (VITAMIN D) 1000 UNITS tablet Take 2,000 Units by mouth daily.      . clopidogrel (PLAVIX) 75 MG tablet Take 75 mg by mouth daily.      . fish oil-omega-3 fatty acids 1000 MG capsule Take 1 g by mouth daily.       . hydrochlorothiazide (HYDRODIURIL) 25 MG tablet Take 25 mg by mouth daily.      . isosorbide mononitrate (IMDUR) 60 MG 24 hr tablet Take 60 mg by mouth 2 (two) times daily.       .  lansoprazole (PREVACID) 15 MG capsule Take 15 mg by mouth daily.      . multivitamin (RENA-VIT) TABS tablet Take 1 tablet by mouth daily.      . nitroGLYCERIN (NITROSTAT) 0.4 MG SL tablet Place 0.4 mg under the tongue every 5 (five) minutes as needed.      Marland Kitchen PARoxetine (PAXIL) 20 MG tablet Take 20 mg by mouth every morning.      Marland Kitchen  ramipril (ALTACE) 2.5 MG tablet Take 2.5 mg by mouth 2 (two) times daily.       . ranolazine (RANEXA) 500 MG 12 hr tablet Take 500 mg by mouth 2 (two) times daily.      . rosuvastatin (CRESTOR) 40 MG tablet Take 40 mg by mouth daily.      . vitamin B-12 (CYANOCOBALAMIN) 1000 MCG tablet Take 1,000 mcg by mouth daily.       No current facility-administered medications for this visit.       Review of Systems:     Cardiac Review of Systems: Y or N  Chest Pain [ y   ]  Resting SOB [ n  ] Exertional SOB  [ n ]  Orthopnea [ n ]   Pedal Edema [ n  ]    Palpitations [n ] Syncope  [ n]   Presyncope [  n ]  General Review of Systems: [Y] = yes [  ]=no Constitional: recent weight change [n  ]; anorexia [ n ]; fatigue [ y ]; nausea [ n ]; night sweats [n  ]; fever [n  ]; or chills [  n];                                                                                                                                          Dental: poor dentition[  ]; Last Dentist visit:   Eye : blurred vision [  ]; diplopia [   ]; vision changes [  ];  Amaurosis fugax[  ]; Resp: cough [  ];  wheezing[  ];  hemoptysis[  ]; shortness of breath[  ]; paroxysmal nocturnal dyspnea[  ]; dyspnea on exertion[  ]; or orthopnea[  ];  GI:  gallstones[  ], vomiting[  ];  dysphagia[  ]; melena[  ];  hematochezia [  ]; heartburn[  ];   Hx of  Colonoscopy[y  ]; GU: kidney stones [  ]; hematuria[  ];   dysuria [  ];  nocturia[  ];  history of     obstruction [  ]; urinary frequency [ n ]             Skin: rash, swelling[  ];, hair loss[  ];  peripheral edema[  ];  or itching[  ]; Musculosketetal: myalgias[   ];  joint swelling[  ];  joint erythema[  ];  joint pain[  ];  back pain[  ];  Heme/Lymph: bruising[  ];  bleeding[  ];  anemia[  ];  Neuro: TIA[  ];  headaches[  ];  stroke[  ];  vertigo[  ];  seizures[  ];   paresthesias[  ];  difficulty walking[ mild claudication in hips bilateral, and rt calf ];  Psych:depression[ n ]; anxiety[ n ];  Endocrine: diabetes[  ];  thyroid dysfunction[  ];  Immunizations: Flu [  y]; Pneumococcal[ y ];  Other:  Physical Exam: BP 109/58  Pulse 52  Resp 18  Ht 5\' 11"  (1.803 m)  Wt 162 lb (73.483 kg)  BMI 22.6 kg/m2  SpO2 97%  General appearance: alert, cooperative, appears stated age and no distress Neurologic: intact Heart: regular rate and rhythm, S1, S2 normal, no murmur, click, rub or gallop and normal apical impulse Lungs: clear to auscultation bilaterally and normal percussion bilaterally Abdomen: soft, non-tender; bowel sounds normal; no masses,  no organomegaly Extremities: extremities normal, atraumatic, no cyanosis or edema, Homans sign is negative, no sign of DVT and Vein was previously harvested from the right ankle to the upper thigh with open technique Wound: History was stable and well healed The patient has no carotid bruits, though he does have known carotid disease and has had the right carotid stented, he has weakly palpable DP and PT pulses bilaterally   Diagnostic Studies & Laboratory data:     Recent Radiology Findings:   No results found.    Recent Lab Findings: Lab Results  Component Value Date   WBC 6.8 09/06/2012   HGB 13.1 09/06/2012   HCT 37.4* 09/06/2012   PLT 161 09/06/2012   GLUCOSE 91 09/06/2012   CHOL  Value: 109        ATP III CLASSIFICATION:  <200     mg/dL   Desirable  161-096  mg/dL   Borderline High  >=045    mg/dL   High        40/98/1191   TRIG 114 04/18/2009   HDL 34* 04/18/2009   LDLCALC  Value: 52        Total Cholesterol/HDL:CHD Risk Coronary Heart Disease Risk Table                     Men   Women  1/2  Average Risk   3.4   3.3  Average Risk       5.0   4.4  2 X Average Risk   9.6   7.1  3 X Average Risk  23.4   11.0        Use the calculated Patient Ratio above and the CHD Risk Table to determine the patient's CHD Risk.        ATP III CLASSIFICATION (LDL):  <100     mg/dL   Optimal  478-295  mg/dL   Near or Above                    Optimal  130-159  mg/dL   Borderline  621-308  mg/dL   High  >657     mg/dL   Very High 84/69/6295   ALT 25 09/06/2012   AST 26 09/06/2012   NA 138 09/06/2012   K 4.0 09/06/2012   CL 105 09/06/2012   CREATININE 1.25 09/06/2012   BUN 32* 09/06/2012   CO2 22 09/06/2012   INR 1.00 09/06/2012   Recent Cath [facility]:  [facility]  FINAL CARDIAC DIAGNOSTIC CATHETERIZATION REPORT  Berman, Grainger MR #: [id number]   Procedure Date: 08/06/2012 Account #:      [id number] Attending:      Lenora Boys  Location:       7RT  Cath Attending: Steffanie Dunn, MD Cath Assistant: Fausto Skillern, MD  CARDIAC CATHETERIZATION PROCEDURE(S):    Left heart cath, Coronary Angiography and Left Ventriculogram, SVG and  Arterial Bypass ([id number])     STUDY INDICATION(S):   Non-STEMI (410.71)  APPROACH:   Approach 1: Percutaneous right femoral artery     Sheath: 66F        CATHETERS: 66F JL4, 66F JR4, 66F Pig, 66F IM Diagnostic  Catheter Cordis   CONTRAST AGENT:   Omnipaque 350 125 cc   FLOURO TIME:      8.9 min     IN-LAB MEDICATIONS:  Medication         Dose  Units  Method  Time Given  Given By               0.9 Normal Saline  100   ml     IV      13:50       Duncan Dull RN    Versed             0.5   mg     IV      14:02       Duncan Dull RN    Lidocaine          5     ml     Ilion      14:03       Larose Kells MD   Fentanyl           25    mcg    IV      14:04       Duncan Dull RN    Versed             0.5   mg     IV      14:05       Duncan Dull RN         HEMODYNAMIC DATA:      Height:     71 in.      Weight:     160  lbs.  LEFT HEART CATHETERIZATION PROTOCOL  PRESSURES:      Site            Baseline Values, mm Hg      S/P Angiography, mm Hg                        S/D           Mean           S/D            Mean        LV             135 / 18                     137 / 16                     AO             125 / 54          81        130 / 49           81        OTHER HEMODYNAMIC INFORMATION:      Heart Rate:        65      CARDIOVASCULAR ANGIOGRAPHY  LEFT VENTRICULOGRAPHY:      LV ejection fraction = 40%      Regional Wall Motion:                  Anterobasal:  Mild Hypokinesis           Anterolateral:    Mild Hypokinesis           Apical:           Mild Hypokinesis           Apical Septal:    Mild Hypokinesis           Basal Septal:     Mild Hypokinesis           Diaphragmatic:    Mild Hypokinesis           Inferior Lateral: Akinesis           Posterobasal:     Mild Hypokinesis           Posterolateral:   Mild Hypokinesis           Superior Lateral: Mild Hypokinesis      AORTOGRAPHY:      Aortogram Type:       thoracic aorta  Comments: Iliac angiogram: Moderate distal aortic disease. Moderate bilateral  common iliiacs plaque. 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 The left  common iliac 25% lesion. 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 No flow limiting lesions.       CORONARY ANGIOGRAPHY:  Vessel Segment  Vessel Type  Stenosis (%)  Description  Comment   Mid Circumflex  Native       100                                  Mid LAD         Native       100                                  Distal LAD      Native       25            Diffuse                      Ramus Present:       No Coronary Dominance:  Left  BYPASS GRAFT ANGIOGRAPHY:  Material Type                  Stenosis (%)  1st Insertion  Sten (%)  2nd  Insertion  Comment   Left Internal Mammary Artery   0             Mid LAD                                            Saphenous Vein                  0             1st Marginal                                       Saphenous Vein                 100           Mid RCA  Saphenous Vein                 100           1st Diagonal                              CONCLUSIONS:       CORONARY STATUS: Obstructive 3 vessel        LV FUNCTION:              Ejection Fraction:   40%              Wall Motion:         Abnormal           OTHER:  LM nl LAD mid 100%, LIMA patent, SVG to (?) diag 100% LCX mid 100%, SVG to OM patent with small native OM RCA 100%, SVG 100%             I was present for key elements of the procedure; personally performed and/or directly supervised it.  I have personally interpreted the angiograms.  I also attest that all medications given and all point of care testing performed during the procedure were ordered by me.       Electronically Finalized by:    Steffanie Dunn, MD   on 08/16/2012  2:19:06 PM   Renato Modena Nunnery, MD Cardiac Catheterization Attending (Finalized appears above)   Electronically approved by: Fausto Skillern, MD  on 08/09/2012 12:59:17  PM    Fausto Skillern, MD Cardiac Catheterization Assistant (Approved appears above)  Report Creation Date:  08/06/2012 Report ID: [id number]   Assessment / Plan:    Recurrent angina with progression of native and graft disease, with suitable targets in the totally occluded right system for bypass Cerebrovascular disease Peripheral vascular disease, with lower extremity indices in the past of 50-60% 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 Over all his health condition is good and his activity level is also good. I have recommended to him redo coronary artery bypass grafting for relief of symptoms. The risks  and options of redo surgery have been discussed with him. He notes that over the past 5-7 days his symptoms have been better than at the time of his last admission. He is point to consider his options and discuss with Dr. Donnie Aho, if he is agreeable with proceeding with surgery we will obtain carotids Doppler studies, lower extremity Dopplers, echocardiogram, and stop his Plavix 5-7  days preop and hold his ACE inhibitor 36-48 hours preop.   Delight Ovens MD  Beeper [phone] Office [phone] 09/21/2012 12:11 PM

## 2012-10-04 ENCOUNTER — Other Ambulatory Visit: Payer: Self-pay | Admitting: *Deleted

## 2012-10-04 DIAGNOSIS — I2510 Atherosclerotic heart disease of native coronary artery without angina pectoris: Secondary | ICD-10-CM

## 2012-10-12 ENCOUNTER — Encounter (HOSPITAL_COMMUNITY): Payer: Self-pay | Admitting: Pharmacy Technician

## 2012-10-14 ENCOUNTER — Ambulatory Visit (HOSPITAL_COMMUNITY)
Admission: RE | Admit: 2012-10-14 | Discharge: 2012-10-14 | Disposition: A | Discharge status: Home / Self Care | Payer: Medicare HMO | Source: Ambulatory Visit | Attending: Cardiothoracic Surgery | Admitting: Cardiothoracic Surgery

## 2012-10-14 ENCOUNTER — Encounter (HOSPITAL_COMMUNITY)
Admission: RE | Admit: 2012-10-14 | Discharge: 2012-10-14 | Disposition: A | Discharge status: Home / Self Care | Payer: Medicare HMO | Source: Ambulatory Visit | Attending: Cardiothoracic Surgery | Admitting: Cardiothoracic Surgery

## 2012-10-14 ENCOUNTER — Other Ambulatory Visit (HOSPITAL_COMMUNITY): Payer: Medicare PPO

## 2012-10-14 DIAGNOSIS — I2510 Atherosclerotic heart disease of native coronary artery without angina pectoris: Secondary | ICD-10-CM | POA: Insufficient documentation

## 2012-10-14 DIAGNOSIS — I10 Essential (primary) hypertension: Secondary | ICD-10-CM | POA: Insufficient documentation

## 2012-10-14 DIAGNOSIS — Z01810 Encounter for preprocedural cardiovascular examination: Secondary | ICD-10-CM

## 2012-10-14 NOTE — Progress Notes (Signed)
Echocardiogram 2D Echocardiogram has been performed.  Shaylan Tutton 10/14/2012, 1:56 PM

## 2012-10-14 NOTE — Pre-Procedure Instructions (Signed)
Ian Cummings.  10/14/2012   Your procedure is scheduled on:  Monday, June 23rd  Report to [facility] at 0530 AM. Come to main entrance "A" and go to east elevators up to 3rd floor. Check in at short stay desk.  Call this number if you have problems the morning of surgery: [phone]   Remember:   Do not eat food or drink liquids after midnight.   Take these medicines the morning of surgery with A SIP OF WATER: Norvasc, Atenolol, Prevacid, Paxil   Do not wear jewelry.  Do not wear lotions, powders, or perfume, deodorant.  Do not shave 48 hours prior to surgery. Men may shave face and neck.  Do not bring valuables to the hospital.  [facility] is not responsible  for any belongings or valuables.  Contacts, dentures or bridgework may not be worn into surgery.  Leave suitcase in the car. After surgery it may be brought to your room.  For patients admitted to the hospital, checkout time is 11:00 AM the day of discharge.   Patients discharged the day of surgery will not be allowed to drive home.   Special Instructions: Shower using CHG 2 nights before surgery and the night before surgery.  If you shower the day of surgery use CHG.  Use special wash - you have one bottle of CHG for all showers.  You should use approximately 1/3 of the bottle for each shower.   Please read over the following fact sheets that you were given: Pain Booklet, Coughing and Deep Breathing, Blood Transfusion Information, MRSA Information and Surgical Site Infection Prevention

## 2012-10-14 NOTE — Progress Notes (Signed)
Pre-op Cardiac Surgery  Carotid Findings:  Right = S/P stent. 0-39% ICA stenosis. Left = 60-79% ICA stenosis, low end of scale. Antegrade, but atypical, vertebral flow bilaterally.   Upper Extremity Right Left  Brachial Pressures 126 128  Radial Waveforms Bi Tri  Ulnar Waveforms Tri Triq  Palmar Arch (Allen's Test) Obliterates with radial compression, normal with ulnar compression Normal with radial compression, obliterates with ulnar compression    Lower  Extremity Right Left  Dorsalis Pedis 89, damp mono 79, damp mono  Anterior Tibial    Posterior Tibial 54, damp mono 67, damp mono  Ankle/Brachial Indices 0.7, moderate arterial disease 0.62, moderate arterial disease    Farrel Demark, RDMS, RVT 10/14/2012

## 2012-10-15 ENCOUNTER — Encounter (HOSPITAL_COMMUNITY)
Admission: RE | Admit: 2012-10-15 | Discharge: 2012-10-15 | Disposition: A | Discharge status: Home / Self Care | Payer: Medicare HMO | Source: Ambulatory Visit | Attending: Cardiothoracic Surgery | Admitting: Cardiothoracic Surgery

## 2012-10-15 ENCOUNTER — Encounter (HOSPITAL_COMMUNITY): Payer: Self-pay

## 2012-10-15 VITALS — BP 142/63 | HR 53 | Temp 97.9°F | Resp 18 | Ht 71.0 in | Wt 167.2 lb

## 2012-10-15 DIAGNOSIS — I2510 Atherosclerotic heart disease of native coronary artery without angina pectoris: Secondary | ICD-10-CM

## 2012-10-15 HISTORY — DX: Transient cerebral ischemic attack, unspecified: G45.9

## 2012-10-15 HISTORY — DX: Personal history of other diseases of the digestive system: Z87.19

## 2012-10-15 HISTORY — DX: Polyneuropathy, unspecified: G62.9

## 2012-10-15 LAB — ABO/RH: ABO/RH(D): A POS

## 2012-10-15 LAB — URINALYSIS, ROUTINE W REFLEX MICROSCOPIC
Bilirubin Urine: NEGATIVE
Glucose, UA: NEGATIVE mg/dL
Hgb urine dipstick: NEGATIVE
Ketones, ur: NEGATIVE mg/dL
Leukocytes, UA: NEGATIVE
Nitrite: NEGATIVE
Protein, ur: NEGATIVE mg/dL
Specific Gravity, Urine: 1.015 (ref 1.005–1.030)
Urobilinogen, UA: 1 mg/dL (ref 0.0–1.0)
pH: 6.5 (ref 5.0–8.0)

## 2012-10-15 LAB — BLOOD GAS, ARTERIAL
Acid-base deficit: 0 mmol/L (ref 0.0–2.0)
Bicarbonate: 23.5 mEq/L (ref 20.0–24.0)
Drawn by: 344381
FIO2: 0.21 %
O2 Saturation: 97.9 %
Patient temperature: 98.6
TCO2: 24.6 mmol/L (ref 0–100)
pCO2 arterial: 34.5 mmHg — ABNORMAL LOW (ref 35.0–45.0)
pH, Arterial: 7.448 (ref 7.350–7.450)
pO2, Arterial: 99.2 mmHg (ref 80.0–100.0)

## 2012-10-15 LAB — CBC
HCT: 35.8 % — ABNORMAL LOW (ref 39.0–52.0)
Hemoglobin: 12.7 g/dL — ABNORMAL LOW (ref 13.0–17.0)
MCH: 32.1 pg (ref 26.0–34.0)
MCHC: 35.5 g/dL (ref 30.0–36.0)
MCV: 90.4 fL (ref 78.0–100.0)
Platelets: 149 10*3/uL — ABNORMAL LOW (ref 150–400)
RBC: 3.96 MIL/uL — ABNORMAL LOW (ref 4.22–5.81)
RDW: 14.2 % (ref 11.5–15.5)
WBC: 6.6 10*3/uL (ref 4.0–10.5)

## 2012-10-15 LAB — COMPREHENSIVE METABOLIC PANEL
ALT: 22 U/L (ref 0–53)
AST: 26 U/L (ref 0–37)
Albumin: 3.5 g/dL (ref 3.5–5.2)
Alkaline Phosphatase: 44 U/L (ref 39–117)
BUN: 22 mg/dL (ref 6–23)
CO2: 21 mEq/L (ref 19–32)
Calcium: 9 mg/dL (ref 8.4–10.5)
Chloride: 104 mEq/L (ref 96–112)
Creatinine, Ser: 0.94 mg/dL (ref 0.50–1.35)
GFR calc Af Amer: 88 mL/min — ABNORMAL LOW (ref >90)
GFR calc non Af Amer: 76 mL/min — ABNORMAL LOW (ref >90)
Glucose, Bld: 96 mg/dL (ref 70–99)
Potassium: 4.6 mEq/L (ref 3.5–5.1)
Sodium: 136 mEq/L (ref 135–145)
Total Bilirubin: 0.3 mg/dL (ref 0.3–1.2)
Total Protein: 7.3 g/dL (ref 6.0–8.3)

## 2012-10-15 LAB — APTT: aPTT: 28 seconds (ref 24–37)

## 2012-10-15 LAB — SURGICAL PCR SCREEN
MRSA, PCR: NEGATIVE
Staphylococcus aureus: POSITIVE — AB

## 2012-10-15 LAB — PROTIME-INR
INR: 0.95 (ref 0.00–1.49)
Prothrombin Time: 12.6 seconds (ref 11.6–15.2)

## 2012-10-15 NOTE — Progress Notes (Addendum)
Pt denies SOB and chest pain. Spoke with [facility] from Dr. Ysidro Evert office regarding  Aspirin administration. Dee advised that pt is to continue taking aspirin as instructed. Pt made aware to Stop NSAIDS and herbs ( Fish Oil). Pt also made aware to bring in teaching packet and Incentive spirometer on DOS. Pt also made aware to bring Mupirocin on day of surgery. HbA1c pending results DOS.

## 2012-10-16 LAB — HEMOGLOBIN A1C
Hgb A1c MFr Bld: 5.1 % (ref <5.7)
Mean Plasma Glucose: 100 mg/dL (ref <117)

## 2012-10-17 MED ORDER — DEXMEDETOMIDINE HCL IN NACL 400 MCG/100ML IV SOLN
0.1000 ug/kg/h | INTRAVENOUS | Status: AC
  Administered 2012-10-18: 0.2 ug/kg/h via INTRAVENOUS
  Filled 2012-10-17 (×2): qty 100

## 2012-10-17 MED ORDER — VANCOMYCIN HCL 10 G IV SOLR
1250.0000 mg | INTRAVENOUS | Status: AC
  Administered 2012-10-18: 1250 mg via INTRAVENOUS
  Filled 2012-10-17: qty 1250

## 2012-10-17 MED ORDER — EPINEPHRINE HCL 1 MG/ML IJ SOLN
0.5000 ug/min | INTRAVENOUS | Status: DC
  Filled 2012-10-17: qty 4

## 2012-10-17 MED ORDER — PAPAVERINE HCL 30 MG/ML IJ SOLN
INTRAMUSCULAR | Status: AC
  Administered 2012-10-18: 09:00:00
  Filled 2012-10-17: qty 2.5

## 2012-10-17 MED ORDER — DEXTROSE 5 % IV SOLN
30.0000 ug/min | INTRAVENOUS | Status: DC
  Administered 2012-10-18: 5 ug/min via INTRAVENOUS
  Filled 2012-10-17: qty 2

## 2012-10-17 MED ORDER — DEXTROSE 5 % IV SOLN
1.5000 g | INTRAVENOUS | Status: AC
  Administered 2012-10-18: .75 g via INTRAVENOUS
  Administered 2012-10-18: 1.5 g via INTRAVENOUS
  Filled 2012-10-17: qty 1.5

## 2012-10-17 MED ORDER — POTASSIUM CHLORIDE 2 MEQ/ML IV SOLN
80.0000 meq | INTRAVENOUS | Status: DC
  Filled 2012-10-17: qty 40

## 2012-10-17 MED ORDER — HEPARIN SODIUM (PORCINE) 1000 UNIT/ML IJ SOLN
INTRAMUSCULAR | Status: DC
  Filled 2012-10-17: qty 30

## 2012-10-17 MED ORDER — SODIUM CHLORIDE 0.9 % IV SOLN
INTRAVENOUS | Status: AC
  Administered 2012-10-18: 1 [IU]/h via INTRAVENOUS
  Filled 2012-10-17: qty 1

## 2012-10-17 MED ORDER — MAGNESIUM SULFATE 50 % IJ SOLN
40.0000 meq | INTRAMUSCULAR | Status: DC
  Filled 2012-10-17: qty 10

## 2012-10-17 MED ORDER — DEXTROSE 5 % IV SOLN
750.0000 mg | INTRAVENOUS | Status: DC
  Filled 2012-10-17 (×2): qty 750

## 2012-10-17 MED ORDER — DOPAMINE-DEXTROSE 3.2-5 MG/ML-% IV SOLN
2.0000 ug/kg/min | INTRAVENOUS | Status: DC
  Filled 2012-10-17: qty 250

## 2012-10-17 MED ORDER — SODIUM CHLORIDE 0.9 % IV SOLN
INTRAVENOUS | Status: AC
  Administered 2012-10-18: 69.8 mL/h via INTRAVENOUS
  Filled 2012-10-17: qty 40

## 2012-10-17 MED ORDER — NITROGLYCERIN IN D5W 200-5 MCG/ML-% IV SOLN
2.0000 ug/min | INTRAVENOUS | Status: AC
  Administered 2012-10-18: 5 ug/min via INTRAVENOUS
  Filled 2012-10-17: qty 250

## 2012-10-18 ENCOUNTER — Inpatient Hospital Stay (HOSPITAL_COMMUNITY): Payer: Medicare HMO

## 2012-10-18 ENCOUNTER — Encounter (HOSPITAL_COMMUNITY): Payer: Self-pay | Admitting: Anesthesiology

## 2012-10-18 ENCOUNTER — Encounter (HOSPITAL_COMMUNITY)
Admission: RE | Disposition: A | Discharge status: Home Health | Payer: Self-pay | Source: Ambulatory Visit | Attending: Cardiothoracic Surgery

## 2012-10-18 ENCOUNTER — Encounter (HOSPITAL_COMMUNITY): Payer: Self-pay | Admitting: *Deleted

## 2012-10-18 ENCOUNTER — Inpatient Hospital Stay (HOSPITAL_COMMUNITY)
Admission: RE | Admit: 2012-10-18 | Discharge: 2012-10-24 | DRG: 236 | Disposition: A | Discharge status: Home Health | Payer: Medicare HMO | Source: Ambulatory Visit | Attending: Cardiothoracic Surgery | Admitting: Cardiothoracic Surgery

## 2012-10-18 ENCOUNTER — Inpatient Hospital Stay (HOSPITAL_COMMUNITY): Payer: Medicare HMO | Admitting: Anesthesiology

## 2012-10-18 DIAGNOSIS — Z9861 Coronary angioplasty status: Secondary | ICD-10-CM

## 2012-10-18 DIAGNOSIS — E8779 Other fluid overload: Secondary | ICD-10-CM | POA: Diagnosis not present

## 2012-10-18 DIAGNOSIS — R7989 Other specified abnormal findings of blood chemistry: Secondary | ICD-10-CM | POA: Diagnosis present

## 2012-10-18 DIAGNOSIS — I10 Essential (primary) hypertension: Secondary | ICD-10-CM | POA: Diagnosis present

## 2012-10-18 DIAGNOSIS — I214 Non-ST elevation (NSTEMI) myocardial infarction: Secondary | ICD-10-CM | POA: Diagnosis present

## 2012-10-18 DIAGNOSIS — G5790 Unspecified mononeuropathy of unspecified lower limb: Secondary | ICD-10-CM | POA: Diagnosis present

## 2012-10-18 DIAGNOSIS — D696 Thrombocytopenia, unspecified: Secondary | ICD-10-CM | POA: Diagnosis present

## 2012-10-18 DIAGNOSIS — D62 Acute posthemorrhagic anemia: Secondary | ICD-10-CM | POA: Diagnosis not present

## 2012-10-18 DIAGNOSIS — Z87891 Personal history of nicotine dependence: Secondary | ICD-10-CM

## 2012-10-18 DIAGNOSIS — M109 Gout, unspecified: Secondary | ICD-10-CM

## 2012-10-18 DIAGNOSIS — I2510 Atherosclerotic heart disease of native coronary artery without angina pectoris: Secondary | ICD-10-CM | POA: Diagnosis present

## 2012-10-18 DIAGNOSIS — Z951 Presence of aortocoronary bypass graft: Secondary | ICD-10-CM

## 2012-10-18 DIAGNOSIS — I25110 Atherosclerotic heart disease of native coronary artery with unstable angina pectoris: Secondary | ICD-10-CM | POA: Diagnosis present

## 2012-10-18 DIAGNOSIS — E039 Hypothyroidism, unspecified: Secondary | ICD-10-CM | POA: Diagnosis not present

## 2012-10-18 DIAGNOSIS — E871 Hypo-osmolality and hyponatremia: Secondary | ICD-10-CM | POA: Diagnosis not present

## 2012-10-18 DIAGNOSIS — I25810 Atherosclerosis of coronary artery bypass graft(s) without angina pectoris: Principal | ICD-10-CM | POA: Diagnosis present

## 2012-10-18 DIAGNOSIS — Z8673 Personal history of transient ischemic attack (TIA), and cerebral infarction without residual deficits: Secondary | ICD-10-CM

## 2012-10-18 DIAGNOSIS — I4891 Unspecified atrial fibrillation: Secondary | ICD-10-CM | POA: Diagnosis present

## 2012-10-18 DIAGNOSIS — K219 Gastro-esophageal reflux disease without esophagitis: Secondary | ICD-10-CM | POA: Diagnosis present

## 2012-10-18 DIAGNOSIS — E785 Hyperlipidemia, unspecified: Secondary | ICD-10-CM | POA: Diagnosis present

## 2012-10-18 DIAGNOSIS — N400 Enlarged prostate without lower urinary tract symptoms: Secondary | ICD-10-CM | POA: Diagnosis present

## 2012-10-18 HISTORY — PX: INTRAOPERATIVE TRANSESOPHAGEAL ECHOCARDIOGRAM: SHX5062

## 2012-10-18 HISTORY — PX: CORONARY ARTERY BYPASS GRAFT: SHX141

## 2012-10-18 LAB — POCT I-STAT 3, ART BLOOD GAS (G3+)
Acid-base deficit: 1 mmol/L (ref 0.0–2.0)
Acid-base deficit: 1 mmol/L (ref 0.0–2.0)
Acid-base deficit: 3 mmol/L — ABNORMAL HIGH (ref 0.0–2.0)
Bicarbonate: 26.9 mEq/L — ABNORMAL HIGH (ref 20.0–24.0)
Bicarbonate: 27.2 mEq/L — ABNORMAL HIGH (ref 20.0–24.0)
Bicarbonate: 24.7 mEq/L — ABNORMAL HIGH (ref 20.0–24.0)
Bicarbonate: 25.1 mEq/L — ABNORMAL HIGH (ref 20.0–24.0)
Bicarbonate: 23.2 mEq/L (ref 20.0–24.0)
O2 Saturation: 100 %
O2 Saturation: 100 %
O2 Saturation: 93 %
O2 Saturation: 97 %
O2 Saturation: 84 %
Patient temperature: 33.7
Patient temperature: 36.9
Patient temperature: 36.9
TCO2: 29 mmol/L (ref 0–100)
TCO2: 29 mmol/L (ref 0–100)
TCO2: 26 mmol/L (ref 0–100)
TCO2: 26 mmol/L (ref 0–100)
TCO2: 25 mmol/L (ref 0–100)
pCO2 arterial: 54.6 mmHg — ABNORMAL HIGH (ref 35.0–45.0)
pCO2 arterial: 56.7 mmHg — ABNORMAL HIGH (ref 35.0–45.0)
pCO2 arterial: 38.4 mmHg (ref 35.0–45.0)
pCO2 arterial: 46.3 mmHg — ABNORMAL HIGH (ref 35.0–45.0)
pCO2 arterial: 46.1 mmHg — ABNORMAL HIGH (ref 35.0–45.0)
pH, Arterial: 7.284 — ABNORMAL LOW (ref 7.350–7.450)
pH, Arterial: 7.305 — ABNORMAL LOW (ref 7.350–7.450)
pH, Arterial: 7.401 (ref 7.350–7.450)
pH, Arterial: 7.341 — ABNORMAL LOW (ref 7.350–7.450)
pH, Arterial: 7.31 — ABNORMAL LOW (ref 7.350–7.450)
pO2, Arterial: 337 mmHg — ABNORMAL HIGH (ref 80.0–100.0)
pO2, Arterial: 396 mmHg — ABNORMAL HIGH (ref 80.0–100.0)
pO2, Arterial: 56 mmHg — ABNORMAL LOW (ref 80.0–100.0)
pO2, Arterial: 98 mmHg (ref 80.0–100.0)
pO2, Arterial: 53 mmHg — ABNORMAL LOW (ref 80.0–100.0)

## 2012-10-18 LAB — POCT I-STAT, CHEM 8
BUN: 19 mg/dL (ref 6–23)
Calcium, Ion: 1.15 mmol/L (ref 1.13–1.30)
Chloride: 108 mEq/L (ref 96–112)
Creatinine, Ser: 0.9 mg/dL (ref 0.50–1.35)
Glucose, Bld: 87 mg/dL (ref 70–99)
HCT: 44 % (ref 39.0–52.0)
Hemoglobin: 15 g/dL (ref 13.0–17.0)
Potassium: 3.9 mEq/L (ref 3.5–5.1)
Sodium: 141 mEq/L (ref 135–145)
TCO2: 23 mmol/L (ref 0–100)

## 2012-10-18 LAB — GLUCOSE, CAPILLARY
Glucose-Capillary: 96 mg/dL (ref 70–99)
Glucose-Capillary: 102 mg/dL — ABNORMAL HIGH (ref 70–99)
Glucose-Capillary: 106 mg/dL — ABNORMAL HIGH (ref 70–99)
Glucose-Capillary: 99 mg/dL (ref 70–99)
Glucose-Capillary: 87 mg/dL (ref 70–99)
Glucose-Capillary: 115 mg/dL — ABNORMAL HIGH (ref 70–99)
Glucose-Capillary: 85 mg/dL (ref 70–99)

## 2012-10-18 LAB — POCT I-STAT 4, (NA,K, GLUC, HGB,HCT)
Glucose, Bld: 115 mg/dL — ABNORMAL HIGH (ref 70–99)
Glucose, Bld: 102 mg/dL — ABNORMAL HIGH (ref 70–99)
Glucose, Bld: 105 mg/dL — ABNORMAL HIGH (ref 70–99)
Glucose, Bld: 106 mg/dL — ABNORMAL HIGH (ref 70–99)
Glucose, Bld: 101 mg/dL — ABNORMAL HIGH (ref 70–99)
HCT: 33 % — ABNORMAL LOW (ref 39.0–52.0)
HCT: 30 % — ABNORMAL LOW (ref 39.0–52.0)
HCT: 28 % — ABNORMAL LOW (ref 39.0–52.0)
HCT: 29 % — ABNORMAL LOW (ref 39.0–52.0)
HCT: 33 % — ABNORMAL LOW (ref 39.0–52.0)
Hemoglobin: 11.2 g/dL — ABNORMAL LOW (ref 13.0–17.0)
Hemoglobin: 10.2 g/dL — ABNORMAL LOW (ref 13.0–17.0)
Hemoglobin: 9.5 g/dL — ABNORMAL LOW (ref 13.0–17.0)
Hemoglobin: 9.9 g/dL — ABNORMAL LOW (ref 13.0–17.0)
Hemoglobin: 11.2 g/dL — ABNORMAL LOW (ref 13.0–17.0)
Potassium: 4.4 mEq/L (ref 3.5–5.1)
Potassium: 4.7 mEq/L (ref 3.5–5.1)
Potassium: 4.8 mEq/L (ref 3.5–5.1)
Potassium: 4.9 mEq/L (ref 3.5–5.1)
Potassium: 4.1 mEq/L (ref 3.5–5.1)
Sodium: 141 mEq/L (ref 135–145)
Sodium: 141 mEq/L (ref 135–145)
Sodium: 141 mEq/L (ref 135–145)
Sodium: 141 mEq/L (ref 135–145)
Sodium: 141 mEq/L (ref 135–145)

## 2012-10-18 LAB — CBC
HCT: 30.8 % — ABNORMAL LOW (ref 39.0–52.0)
HCT: 31.7 % — ABNORMAL LOW (ref 39.0–52.0)
Hemoglobin: 10.6 g/dL — ABNORMAL LOW (ref 13.0–17.0)
Hemoglobin: 11.2 g/dL — ABNORMAL LOW (ref 13.0–17.0)
MCH: 31.3 pg (ref 26.0–34.0)
MCH: 32 pg (ref 26.0–34.0)
MCHC: 34.4 g/dL (ref 30.0–36.0)
MCHC: 35.3 g/dL (ref 30.0–36.0)
MCV: 90.6 fL (ref 78.0–100.0)
Platelets: 126 10*3/uL — ABNORMAL LOW (ref 150–400)
RBC: 3.5 MIL/uL — ABNORMAL LOW (ref 4.22–5.81)
RDW: 14.3 % (ref 11.5–15.5)
WBC: 9.6 10*3/uL (ref 4.0–10.5)

## 2012-10-18 LAB — CREATININE, SERUM
Creatinine, Ser: 0.99 mg/dL (ref 0.50–1.35)
GFR calc Af Amer: 87 mL/min — ABNORMAL LOW (ref >90)
GFR calc non Af Amer: 75 mL/min — ABNORMAL LOW (ref >90)

## 2012-10-18 LAB — MAGNESIUM: Magnesium: 3 mg/dL — ABNORMAL HIGH (ref 1.5–2.5)

## 2012-10-18 LAB — PROTIME-INR: INR: 1.18 (ref 0.00–1.49)

## 2012-10-18 SURGERY — REDO CORONARY ARTERY BYPASS GRAFTING (CABG)
Anesthesia: General | Site: Chest | Wound class: Clean

## 2012-10-18 MED ORDER — ACETAMINOPHEN 160 MG/5ML PO SOLN: 975.0000 mg | Freq: Four times a day (QID) | ORAL | Status: DC

## 2012-10-18 MED ORDER — MAGNESIUM SULFATE 40 MG/ML IJ SOLN
4.0000 g | Freq: Once | INTRAMUSCULAR | Status: AC
  Administered 2012-10-18: 4 g via INTRAVENOUS
  Filled 2012-10-18: qty 100

## 2012-10-18 MED ORDER — ROCURONIUM BROMIDE 100 MG/10ML IV SOLN
INTRAVENOUS | Status: DC | PRN
  Administered 2012-10-18 (×2): 50 mg via INTRAVENOUS

## 2012-10-18 MED ORDER — LACTATED RINGERS IV SOLN: INTRAVENOUS | Status: DC

## 2012-10-18 MED ORDER — SODIUM CHLORIDE 0.9 % IV SOLN
25.0000 ug/h | INTRAVENOUS | Status: DC
  Filled 2012-10-18: qty 1

## 2012-10-18 MED ORDER — SODIUM CHLORIDE 0.9 % IV SOLN
INTRAVENOUS | Status: DC
  Filled 2012-10-18: qty 1

## 2012-10-18 MED ORDER — SODIUM CHLORIDE 0.9 % IJ SOLN
3.0000 mL | Freq: Two times a day (BID) | INTRAMUSCULAR | Status: DC
  Administered 2012-10-19 – 2012-10-20 (×3): 3 mL via INTRAVENOUS

## 2012-10-18 MED ORDER — FENTANYL CITRATE 0.05 MG/ML IJ SOLN
INTRAMUSCULAR | Status: DC | PRN
  Administered 2012-10-18: 100 ug via INTRAVENOUS
  Administered 2012-10-18: 150 ug via INTRAVENOUS
  Administered 2012-10-18: 100 ug via INTRAVENOUS
  Administered 2012-10-18: 150 ug via INTRAVENOUS

## 2012-10-18 MED ORDER — MIDAZOLAM HCL 5 MG/5ML IJ SOLN
INTRAMUSCULAR | Status: DC | PRN
  Administered 2012-10-18 (×2): 2 mg via INTRAVENOUS
  Administered 2012-10-18: 4 mg via INTRAVENOUS
  Administered 2012-10-18: 2 mg via INTRAVENOUS

## 2012-10-18 MED ORDER — SODIUM CHLORIDE 0.45 % IV SOLN
INTRAVENOUS | Status: DC
  Administered 2012-10-18: 14:00:00 via INTRAVENOUS

## 2012-10-18 MED ORDER — VITAMIN D3 25 MCG (1000 UNIT) PO TABS
2000.0000 [IU] | ORAL_TABLET | Freq: Every day | ORAL | Status: DC
  Administered 2012-10-19 – 2012-10-24 (×6): 2000 [IU] via ORAL
  Filled 2012-10-18 (×6): qty 2

## 2012-10-18 MED ORDER — BISACODYL 5 MG PO TBEC
10.0000 mg | DELAYED_RELEASE_TABLET | Freq: Every day | ORAL | Status: DC
  Administered 2012-10-19 – 2012-10-20 (×2): 10 mg via ORAL
  Filled 2012-10-18 (×2): qty 2

## 2012-10-18 MED ORDER — FAMOTIDINE IN NACL 20-0.9 MG/50ML-% IV SOLN
20.0000 mg | Freq: Two times a day (BID) | INTRAVENOUS | Status: AC
  Administered 2012-10-18: 20 mg via INTRAVENOUS

## 2012-10-18 MED ORDER — RENA-VITE PO TABS
1.0000 | ORAL_TABLET | Freq: Every day | ORAL | Status: DC
  Administered 2012-10-19 – 2012-10-24 (×6): 1 via ORAL
  Filled 2012-10-18 (×6): qty 1

## 2012-10-18 MED ORDER — VANCOMYCIN HCL IN DEXTROSE 1-5 GM/200ML-% IV SOLN
1000.0000 mg | Freq: Once | INTRAVENOUS | Status: AC
  Administered 2012-10-18: 1000 mg via INTRAVENOUS
  Filled 2012-10-18: qty 200

## 2012-10-18 MED ORDER — OXYCODONE HCL 5 MG PO TABS
5.0000 mg | ORAL_TABLET | ORAL | Status: DC | PRN
  Administered 2012-10-19: 5 mg via ORAL
  Administered 2012-10-19 – 2012-10-20 (×4): 10 mg via ORAL
  Filled 2012-10-18: qty 1
  Filled 2012-10-18 (×4): qty 2

## 2012-10-18 MED ORDER — PROPOFOL 10 MG/ML IV BOLUS
INTRAVENOUS | Status: DC | PRN
  Administered 2012-10-18: 90 mg via INTRAVENOUS

## 2012-10-18 MED ORDER — BISACODYL 10 MG RE SUPP: 10.0000 mg | Freq: Every day | RECTAL | Status: DC

## 2012-10-18 MED ORDER — HEPARIN SODIUM (PORCINE) 1000 UNIT/ML IJ SOLN
INTRAMUSCULAR | Status: DC | PRN
  Administered 2012-10-18: 12000 [IU] via INTRAVENOUS

## 2012-10-18 MED ORDER — SODIUM CHLORIDE 0.9 % IV SOLN
10.0000 mg | INTRAVENOUS | Status: DC | PRN
  Administered 2012-10-18: 10 ug/min via INTRAVENOUS

## 2012-10-18 MED ORDER — EPHEDRINE SULFATE 50 MG/ML IJ SOLN
INTRAMUSCULAR | Status: DC | PRN
  Administered 2012-10-18: 5 mg via INTRAVENOUS

## 2012-10-18 MED ORDER — INSULIN ASPART 100 UNIT/ML ~~LOC~~ SOLN
0.0000 [IU] | SUBCUTANEOUS | Status: DC
  Administered 2012-10-19 – 2012-10-20 (×5): 2 [IU] via SUBCUTANEOUS

## 2012-10-18 MED ORDER — PAROXETINE HCL 20 MG PO TABS
20.0000 mg | ORAL_TABLET | ORAL | Status: DC
  Administered 2012-10-19 – 2012-10-24 (×6): 20 mg via ORAL
  Filled 2012-10-18 (×9): qty 1

## 2012-10-18 MED ORDER — METOPROLOL TARTRATE 12.5 MG HALF TABLET
12.5000 mg | ORAL_TABLET | Freq: Two times a day (BID) | ORAL | Status: DC
  Administered 2012-10-19 – 2012-10-20 (×4): 12.5 mg via ORAL
  Filled 2012-10-18 (×7): qty 1

## 2012-10-18 MED ORDER — VITAMIN B-12 1000 MCG PO TABS
1000.0000 ug | ORAL_TABLET | Freq: Every day | ORAL | Status: DC
  Administered 2012-10-19 – 2012-10-24 (×6): 1000 ug via ORAL
  Filled 2012-10-18 (×6): qty 1

## 2012-10-18 MED ORDER — ASPIRIN EC 325 MG PO TBEC
325.0000 mg | DELAYED_RELEASE_TABLET | Freq: Every day | ORAL | Status: DC
  Administered 2012-10-19 – 2012-10-20 (×2): 325 mg via ORAL
  Filled 2012-10-18 (×2): qty 1

## 2012-10-18 MED ORDER — METOPROLOL TARTRATE 12.5 MG HALF TABLET: 12.5000 mg | ORAL_TABLET | Freq: Once | ORAL | Status: DC

## 2012-10-18 MED ORDER — PANTOPRAZOLE SODIUM 40 MG PO TBEC
40.0000 mg | DELAYED_RELEASE_TABLET | Freq: Every day | ORAL | Status: DC
  Administered 2012-10-20: 40 mg via ORAL
  Filled 2012-10-18: qty 1

## 2012-10-18 MED ORDER — INSULIN REGULAR BOLUS VIA INFUSION
0.0000 [IU] | Freq: Three times a day (TID) | INTRAVENOUS | Status: DC
  Filled 2012-10-18: qty 10

## 2012-10-18 MED ORDER — POTASSIUM CHLORIDE 10 MEQ/50ML IV SOLN: 10.0000 meq | INTRAVENOUS | Status: AC

## 2012-10-18 MED ORDER — GLYCOPYRROLATE 0.2 MG/ML IJ SOLN
INTRAMUSCULAR | Status: DC | PRN
  Administered 2012-10-18 (×2): 0.2 mg via INTRAVENOUS

## 2012-10-18 MED ORDER — ALLOPURINOL 300 MG PO TABS
300.0000 mg | ORAL_TABLET | Freq: Every day | ORAL | Status: DC
  Administered 2012-10-19 – 2012-10-24 (×6): 300 mg via ORAL
  Filled 2012-10-18 (×6): qty 1

## 2012-10-18 MED ORDER — NITROGLYCERIN IN D5W 200-5 MCG/ML-% IV SOLN: 0.0000 ug/min | INTRAVENOUS | Status: DC

## 2012-10-18 MED ORDER — ALBUMIN HUMAN 5 % IV SOLN: 250.0000 mL | INTRAVENOUS | Status: AC | PRN

## 2012-10-18 MED ORDER — SODIUM CHLORIDE 0.9 % IJ SOLN
OROMUCOSAL | Status: DC | PRN
  Administered 2012-10-18 (×3): via TOPICAL

## 2012-10-18 MED ORDER — DEXTROSE 5 % IV SOLN
1.5000 g | Freq: Two times a day (BID) | INTRAVENOUS | Status: AC
  Administered 2012-10-18 – 2012-10-20 (×4): 1.5 g via INTRAVENOUS
  Filled 2012-10-18 (×4): qty 1.5

## 2012-10-18 MED ORDER — INSULIN ASPART 100 UNIT/ML ~~LOC~~ SOLN: 0.0000 [IU] | SUBCUTANEOUS | Status: AC

## 2012-10-18 MED ORDER — MORPHINE SULFATE 2 MG/ML IJ SOLN
2.0000 mg | INTRAMUSCULAR | Status: DC | PRN
  Administered 2012-10-18: 4 mg via INTRAVENOUS
  Administered 2012-10-18: 2 mg via INTRAVENOUS
  Administered 2012-10-19: 4 mg via INTRAVENOUS
  Administered 2012-10-19: 2 mg via INTRAVENOUS
  Filled 2012-10-18: qty 1
  Filled 2012-10-18: qty 2
  Filled 2012-10-18: qty 1
  Filled 2012-10-18: qty 2

## 2012-10-18 MED ORDER — LACTATED RINGERS IV SOLN
INTRAVENOUS | Status: DC | PRN
  Administered 2012-10-18 (×2): via INTRAVENOUS

## 2012-10-18 MED ORDER — MORPHINE SULFATE 2 MG/ML IJ SOLN: 1.0000 mg | INTRAMUSCULAR | Status: AC | PRN

## 2012-10-18 MED ORDER — PROTAMINE SULFATE 10 MG/ML IV SOLN
INTRAVENOUS | Status: DC | PRN
  Administered 2012-10-18 (×2): 20 mg via INTRAVENOUS
  Administered 2012-10-18: 30 mg via INTRAVENOUS
  Administered 2012-10-18: 10 mg via INTRAVENOUS
  Administered 2012-10-18: 30 mg via INTRAVENOUS
  Administered 2012-10-18 (×2): 20 mg via INTRAVENOUS

## 2012-10-18 MED ORDER — METOCLOPRAMIDE HCL 5 MG/ML IJ SOLN
10.0000 mg | Freq: Four times a day (QID) | INTRAMUSCULAR | Status: AC
  Administered 2012-10-18 – 2012-10-19 (×4): 10 mg via INTRAVENOUS
  Filled 2012-10-18 (×4): qty 2

## 2012-10-18 MED ORDER — METOPROLOL TARTRATE 25 MG/10 ML ORAL SUSPENSION
12.5000 mg | Freq: Two times a day (BID) | ORAL | Status: DC
  Filled 2012-10-18 (×7): qty 5

## 2012-10-18 MED ORDER — ACETAMINOPHEN 500 MG PO TABS
1000.0000 mg | ORAL_TABLET | Freq: Four times a day (QID) | ORAL | Status: DC
  Administered 2012-10-19 (×3): 1000 mg via ORAL
  Filled 2012-10-18 (×9): qty 2

## 2012-10-18 MED ORDER — LACTATED RINGERS IV SOLN: 500.0000 mL | Freq: Once | INTRAVENOUS | Status: AC | PRN

## 2012-10-18 MED ORDER — SODIUM CHLORIDE 0.9 % IR SOLN
Status: DC | PRN
  Administered 2012-10-18: 1

## 2012-10-18 MED ORDER — ASPIRIN 81 MG PO CHEW: 324.0000 mg | CHEWABLE_TABLET | Freq: Every day | ORAL | Status: DC

## 2012-10-18 MED ORDER — CHLORHEXIDINE GLUCONATE 4 % EX LIQD: 30.0000 mL | CUTANEOUS | Status: DC

## 2012-10-18 MED ORDER — VECURONIUM BROMIDE 10 MG IV SOLR
INTRAVENOUS | Status: DC | PRN
  Administered 2012-10-18 (×2): 5 mg via INTRAVENOUS

## 2012-10-18 MED ORDER — ATORVASTATIN CALCIUM 80 MG PO TABS
80.0000 mg | ORAL_TABLET | Freq: Every day | ORAL | Status: DC
  Administered 2012-10-19 – 2012-10-23 (×5): 80 mg via ORAL
  Filled 2012-10-18 (×8): qty 1

## 2012-10-18 MED ORDER — SODIUM CHLORIDE 0.9 % IV SOLN: 250.0000 mL | INTRAVENOUS | Status: DC

## 2012-10-18 MED ORDER — MIDAZOLAM HCL 2 MG/2ML IJ SOLN: 2.0000 mg | INTRAMUSCULAR | Status: DC | PRN

## 2012-10-18 MED ORDER — PHENYLEPHRINE HCL 10 MG/ML IJ SOLN
0.0000 ug/min | INTRAVENOUS | Status: DC
  Filled 2012-10-18: qty 2

## 2012-10-18 MED ORDER — ONDANSETRON HCL 4 MG/2ML IJ SOLN: 4.0000 mg | Freq: Four times a day (QID) | INTRAMUSCULAR | Status: DC | PRN

## 2012-10-18 MED ORDER — METOPROLOL TARTRATE 1 MG/ML IV SOLN: 2.5000 mg | INTRAVENOUS | Status: DC | PRN

## 2012-10-18 MED ORDER — RANOLAZINE ER 500 MG PO TB12
500.0000 mg | ORAL_TABLET | Freq: Two times a day (BID) | ORAL | Status: DC
  Administered 2012-10-19 – 2012-10-22 (×7): 500 mg via ORAL
  Filled 2012-10-18 (×11): qty 1

## 2012-10-18 MED ORDER — SODIUM CHLORIDE 0.9 % IV SOLN: INTRAVENOUS | Status: DC

## 2012-10-18 MED ORDER — DOCUSATE SODIUM 100 MG PO CAPS
200.0000 mg | ORAL_CAPSULE | Freq: Every day | ORAL | Status: DC
  Administered 2012-10-19 – 2012-10-20 (×2): 200 mg via ORAL
  Filled 2012-10-18 (×2): qty 2

## 2012-10-18 MED ORDER — SODIUM CHLORIDE 0.9 % IJ SOLN: 3.0000 mL | INTRAMUSCULAR | Status: DC | PRN

## 2012-10-18 MED ORDER — DEXMEDETOMIDINE HCL IN NACL 200 MCG/50ML IV SOLN
0.1000 ug/kg/h | INTRAVENOUS | Status: DC
  Administered 2012-10-18: 0.7 ug/kg/h via INTRAVENOUS
  Filled 2012-10-18: qty 50

## 2012-10-18 MED ORDER — ACETAMINOPHEN 10 MG/ML IV SOLN
1000.0000 mg | Freq: Once | INTRAVENOUS | Status: AC
  Administered 2012-10-18: 1000 mg via INTRAVENOUS
  Filled 2012-10-18: qty 100

## 2012-10-18 SURGICAL SUPPLY — 106 items
ADAPTER CARDIO PERF ANTE/RETRO (ADAPTER) IMPLANT
ADPR PRFSN 84XANTGRD RTRGD (ADAPTER)
APL SKNCLS STERI-STRIP NONHPOA (GAUZE/BANDAGES/DRESSINGS) ×2
APPLIER CLIP 9.375 MED OPEN (MISCELLANEOUS)
APPLIER CLIP 9.375 SM OPEN (CLIP)
APR CLP MED 9.3 20 MLT OPN (MISCELLANEOUS)
APR CLP SM 9.3 20 MLT OPN (CLIP)
ATTRACTOMAT 16X20 MAGNETIC DRP (DRAPES) ×3 IMPLANT
BAG DECANTER FOR FLEXI CONT (MISCELLANEOUS) ×3 IMPLANT
BANDAGE ELASTIC 4 VELCRO ST LF (GAUZE/BANDAGES/DRESSINGS) ×3 IMPLANT
BANDAGE ELASTIC 6 VELCRO ST LF (GAUZE/BANDAGES/DRESSINGS) ×3 IMPLANT
BANDAGE GAUZE ELAST BULKY 4 IN (GAUZE/BANDAGES/DRESSINGS) ×3 IMPLANT
BENZOIN TINCTURE PRP APPL 2/3 (GAUZE/BANDAGES/DRESSINGS) ×1 IMPLANT
BLADE CORE FAN STRYKER (BLADE) ×6 IMPLANT
BLADE OSC/SAG 18.5X9 THN (BLADE) ×1 IMPLANT
BLADE OSCILLATING /SAGITTAL (BLADE) ×3 IMPLANT
BLADE STERNUM SYSTEM 6 (BLADE) ×3 IMPLANT
BLADE SURG 11 STRL SS (BLADE) ×1 IMPLANT
BLADE SURG ROTATE 9660 (MISCELLANEOUS) IMPLANT
CANISTER SUCTION 2500CC (MISCELLANEOUS) ×3 IMPLANT
CANN PRFSN .5XCNCT 15X34-48 (MISCELLANEOUS) ×2
CANNULA GUNDRY RCSP 15FR (MISCELLANEOUS) ×1 IMPLANT
CANNULA PRFSN .5XCNCT 15X34-48 (MISCELLANEOUS) ×2 IMPLANT
CANNULA VEN 2 STAGE (MISCELLANEOUS) ×3
CANNULA VESSEL W/WING WO/VALVE (CANNULA) IMPLANT
CATH CPB KIT GERHARDT (MISCELLANEOUS) ×3 IMPLANT
CATH RETROPLEGIA CORONARY 14FR (CATHETERS) IMPLANT
CATH THORACIC 28FR (CATHETERS) ×3 IMPLANT
CLIP APPLIE 9.375 MED OPEN (MISCELLANEOUS) IMPLANT
CLIP APPLIE 9.375 SM OPEN (CLIP) IMPLANT
CLIP FOGARTY SPRING 6M (CLIP) IMPLANT
CLIP TI MEDIUM 24 (CLIP) IMPLANT
CLIP TI WIDE RED SMALL 24 (CLIP) IMPLANT
CLOTH BEACON ORANGE TIMEOUT ST (SAFETY) ×3 IMPLANT
CONN Y 3/8X3/8X3/8  BEN (MISCELLANEOUS)
CONN Y 3/8X3/8X3/8 BEN (MISCELLANEOUS) IMPLANT
COVER SURGICAL LIGHT HANDLE (MISCELLANEOUS) ×6 IMPLANT
CRADLE DONUT ADULT HEAD (MISCELLANEOUS) ×3 IMPLANT
DRAIN CHANNEL 28F RND 3/8 FF (WOUND CARE) ×3 IMPLANT
DRAIN CHANNEL 32F RND 10.7 FF (WOUND CARE) IMPLANT
DRAPE CARDIOVASCULAR INCISE (DRAPES) ×3
DRAPE SLUSH/WARMER DISC (DRAPES) ×1 IMPLANT
DRAPE SRG 135X102X78XABS (DRAPES) ×2 IMPLANT
DRSG COVADERM 4X14 (GAUZE/BANDAGES/DRESSINGS) ×3 IMPLANT
ELECT BLADE 4.0 EZ CLEAN MEGAD (MISCELLANEOUS) ×3
ELECT CAUTERY BLADE 6.4 (BLADE) ×3 IMPLANT
ELECT REM PT RETURN 9FT ADLT (ELECTROSURGICAL) ×6
ELECTRODE BLDE 4.0 EZ CLN MEGD (MISCELLANEOUS) ×2 IMPLANT
ELECTRODE REM PT RTRN 9FT ADLT (ELECTROSURGICAL) ×4 IMPLANT
GLOVE BIO SURGEON STRL SZ 6 (GLOVE) ×4 IMPLANT
GLOVE BIO SURGEON STRL SZ 6.5 (GLOVE) ×9 IMPLANT
GLOVE BIOGEL PI IND STRL 6 (GLOVE) IMPLANT
GLOVE BIOGEL PI IND STRL 6.5 (GLOVE) IMPLANT
GLOVE BIOGEL PI INDICATOR 6 (GLOVE) ×5
GLOVE BIOGEL PI INDICATOR 6.5 (GLOVE) ×4
GOWN STRL NON-REIN LRG LVL3 (GOWN DISPOSABLE) ×14 IMPLANT
HEMOSTAT POWDER SURGIFOAM 1G (HEMOSTASIS) IMPLANT
HEMOSTAT SURGICEL 2X14 (HEMOSTASIS) IMPLANT
INSERT FOGARTY XLG (MISCELLANEOUS) IMPLANT
KIT BASIN OR (CUSTOM PROCEDURE TRAY) ×3 IMPLANT
KIT PAIN CUSTOM (MISCELLANEOUS) IMPLANT
KIT ROOM TURNOVER OR (KITS) ×3 IMPLANT
KIT SUCTION CATH 14FR (SUCTIONS) ×7 IMPLANT
KIT VASOVIEW W/TROCAR VH 2000 (KITS) ×3 IMPLANT
LEAD PACING MYOCARDI (MISCELLANEOUS) ×3 IMPLANT
MARKER GRAFT CORONARY BYPASS (MISCELLANEOUS) ×9 IMPLANT
MISTER BLOWER ×1 IMPLANT
NS IRRIG 1000ML POUR BTL (IV SOLUTION) ×12 IMPLANT
PACK OPEN HEART (CUSTOM PROCEDURE TRAY) ×3 IMPLANT
PAD ARMBOARD 7.5X6 YLW CONV (MISCELLANEOUS) ×6 IMPLANT
PAD DEFIB R2 (MISCELLANEOUS) IMPLANT
PAD ELECT DEFIB RADIOL ZOLL (MISCELLANEOUS) ×3 IMPLANT
PENCIL BUTTON HOLSTER BLD 10FT (ELECTRODE) ×3 IMPLANT
PUNCH AORTIC ROTATE 4.0MM (MISCELLANEOUS) IMPLANT
PUNCH AORTIC ROTATE 4.5MM 8IN (MISCELLANEOUS) ×1 IMPLANT
PUNCH AORTIC ROTATE 5MM 8IN (MISCELLANEOUS) IMPLANT
SOLUTION ANTI FOG 6CC (MISCELLANEOUS) ×3 IMPLANT
SPONGE GAUZE 4X4 12PLY (GAUZE/BANDAGES/DRESSINGS) ×6 IMPLANT
SPONGE LAP 18X18 X RAY DECT (DISPOSABLE) ×2 IMPLANT
STRIP CLOSURE SKIN 1/2X4 (GAUZE/BANDAGES/DRESSINGS) ×1 IMPLANT
SUT BONE WAX W31G (SUTURE) ×3 IMPLANT
SUT MNCRL AB 4-0 PS2 18 (SUTURE) ×1 IMPLANT
SUT PROLENE 3 0 SH 1 (SUTURE) ×3 IMPLANT
SUT PROLENE 4 0 TF (SUTURE) ×6 IMPLANT
SUT PROLENE 6 0 CC (SUTURE) ×6 IMPLANT
SUT PROLENE 7 0 BV1 MDA (SUTURE) ×4 IMPLANT
SUT STEEL 6MS V (SUTURE) ×3 IMPLANT
SUT STEEL STERNAL CCS#1 18IN (SUTURE) IMPLANT
SUT STEEL SZ 6 DBL 3X14 BALL (SUTURE) ×3 IMPLANT
SUT VIC AB 1 CTX 18 (SUTURE) ×6 IMPLANT
SUT VIC AB 2-0 CT1 27 (SUTURE) ×3
SUT VIC AB 2-0 CT1 TAPERPNT 27 (SUTURE) IMPLANT
SUT VIC AB 2-0 CTX 27 (SUTURE) IMPLANT
SUT VIC AB 3-0 X1 27 (SUTURE) IMPLANT
SUTURE E-PAK OPEN HEART (SUTURE) ×3 IMPLANT
SYS GUIDANT ACHIEVE OFF PUMP (MISCELLANEOUS) ×1 IMPLANT
SYSTEM SAHARA CHEST DRAIN ATS (WOUND CARE) ×3 IMPLANT
TAPES RETRACTO (MISCELLANEOUS) ×1 IMPLANT
TOWEL OR 17X24 6PK STRL BLUE (TOWEL DISPOSABLE) ×3 IMPLANT
TOWEL OR 17X26 10 PK STRL BLUE (TOWEL DISPOSABLE) ×3 IMPLANT
TRAY FOLEY IC TEMP SENS 14FR (CATHETERS) ×3 IMPLANT
TUBE FEEDING 8FR 16IN STR KANG (MISCELLANEOUS) ×3 IMPLANT
TUBE SUCT INTRACARD DLP 20F (MISCELLANEOUS) ×3 IMPLANT
TUBING INSUFFLATION 10FT LAP (TUBING) ×3 IMPLANT
UNDERPAD 30X30 INCONTINENT (UNDERPADS AND DIAPERS) ×3 IMPLANT
WATER STERILE IRR 1000ML POUR (IV SOLUTION) ×6 IMPLANT

## 2012-10-18 NOTE — Progress Notes (Signed)
Patient ID: Ian Cummings., male   DOB: [birth year], 79 y.o.   MRN: [id number] EVENING ROUNDS NOTE :     [address]       [place] [address]             [phone]                 Day of Surgery Procedure(s) (LRB): REDO CORONARY ARTERY BYPASS GRAFTING (CABG) (N/A) INTRAOPERATIVE TRANSESOPHAGEAL ECHOCARDIOGRAM (N/A)  Total Length of Stay:  LOS: 0 days  BP 102/59  Pulse 79  Temp(Src) 98.4 F (36.9 C) (Core (Comment))  Resp 19  Wt 167 lb 3 oz (75.836 kg)  BMI 23.33 kg/m2  SpO2 99%  .Intake/Output     06/22 0701 - 06/23 0700 06/23 0701 - 06/24 0700   I.V. (mL/kg)  3025.8 (39.9)   Blood  107   IV Piggyback  300   Total Intake(mL/kg)  3432.8 (45.3)   Urine (mL/kg/hr)  590 (0.7)   Blood  325 (0.4)   Chest Tube  110 (0.1)   Total Output   1025   Net   +2407.8          . sodium chloride 20 mL/hr at 10/18/12 1339  . sodium chloride 20 mL/hr at 10/18/12 1300  . [START ON 10/19/2012] sodium chloride    . sodium chloride    . dexmedetomidine Stopped (10/18/12 1700)  . insulin (NOVOLIN-R) infusion 1 Units/hr (10/18/12 1520)  . lactated ringers 20 mL/hr at 10/18/12 1300  . nitroGLYCERIN Stopped (10/18/12 1300)  . phenylephrine (NEO-SYNEPHRINE) Adult infusion 15 mcg/min (10/18/12 1700)     Lab Results  Component Value Date   WBC 7.8 10/18/2012   HGB 10.6* 10/18/2012   HCT 30.8* 10/18/2012   PLT 105* 10/18/2012   GLUCOSE 101* 10/18/2012   CHOL  Value: 109        ATP III CLASSIFICATION:  <200     mg/dL   Desirable  401-027  mg/dL   Borderline High  >=253    mg/dL   High        66/44/0347   TRIG 114 04/18/2009   HDL 34* 04/18/2009   LDLCALC  Value: 52        Total Cholesterol/HDL:CHD Risk Coronary Heart Disease Risk Table                     Men   Women  1/2 Average Risk   3.4   3.3  Average Risk       5.0   4.4  2 X Average Risk   9.6   7.1  3 X Average Risk  23.4   11.0        Use the calculated Patient Ratio above and the CHD Risk Table to determine the  patient's CHD Risk.        ATP III CLASSIFICATION (LDL):  <100     mg/dL   Optimal  425-956  mg/dL   Near or Above                    Optimal  130-159  mg/dL   Borderline  387-564  mg/dL   High  >332     mg/dL   Very High 95/18/8416   ALT 22 10/15/2012   AST 26 10/15/2012   NA 141 10/18/2012   K 4.1 10/18/2012   CL 104 10/15/2012   CREATININE 0.94 10/15/2012   BUN 22 10/15/2012  CO2 21 10/15/2012   INR 1.18 10/18/2012   HGBA1C 5.1 10/15/2012   Waking up close to extubation Not bleeding   Delight Ovens MD  Beeper [phone] Office [phone] 10/18/2012 5:51 PM

## 2012-10-18 NOTE — Progress Notes (Signed)
  Echocardiogram Echocardiogram Transesophageal (OR) has been performed.  Ian Cummings, [facility] 10/18/2012, 8:45 AM

## 2012-10-18 NOTE — Brief Op Note (Addendum)
10/18/2012  11:35 AM  PATIENT:  Ian Cummings.  79 y.o. male  PRE-OPERATIVE DIAGNOSIS:  Coronary artery disease  POST-OPERATIVE DIAGNOSIS:  Coronary artery disease  PROCEDURE:    REDO OFF PUMP CORONARY ARTERY BYPASS GRAFTING x 1 (SVG-PD)  ENDOSCOPIC VEIN HARVEST LEFT THIGH  SURGEON:  Delight Ovens, MD  ASSISTANT: Coral Ceo, PA-C  ANESTHESIA:   general  PATIENT CONDITION:  ICU - intubated and hemodynamically stable.  PRE-OPERATIVE WEIGHT: 76 kg

## 2012-10-18 NOTE — Anesthesia Postprocedure Evaluation (Signed)
  Anesthesia Post-op Note  Patient: Ian Cummings.  Procedure(s) Performed: Procedure(s) with comments: REDO CORONARY ARTERY BYPASS GRAFTING (CABG) (N/A) - off pump times two using endoscopically harvested left saphenous vein INTRAOPERATIVE TRANSESOPHAGEAL ECHOCARDIOGRAM (N/A)  Patient Location: SICU  Anesthesia Type:General  Level of Consciousness: sedated, unresponsive and Patient remains intubated per anesthesia plan  Airway and Oxygen Therapy: Patient placed on Ventilator (see vital sign flow sheet for setting)  Post-op Pain: none  Post-op Assessment: Post-op Vital signs reviewed and Patient's Cardiovascular Status Stable  Post-op Vital Signs: stable  Complications: No apparent anesthesia complications

## 2012-10-18 NOTE — Progress Notes (Signed)
Utilization Review Completed.Ian Cummings T6/23/2014  

## 2012-10-18 NOTE — H&P (Signed)
[address]       [place] [address]             [phone]                           Adolphus Birchwood. [place] Medical Record #[id number] Date of Birth: Mar 07, 1929  Referring: Dr York Spaniel  Primary Care: Kari Baars, MD  Chief Complaint:    Chest pain   History of Present Illness:    Patient is a 79 y.o. male with known history of CAD. He is S/P CABG performed by me in 1995 at that time he had coronary artery bypass grafting x7 with the left internal mammary to the left anterior descending coronary artery reverse saphenous vein graft to the second diagonal sequential reverse saphenous vein graft to the first diagonal and distal circumflex triple reverse saphenous vein graft to the posterior descending posterior lateral branch one and posterior lateral branch 2 of the right coronary artery. Since that time the patient has developed further disease progression involving his saphenous vein grafts and native coronary arteries. The patient states that he has had 10 stents placed to his RCA, RCA graft and Left Circumflex graft. Despite these interventions the patient has continued to experience angina. He states these episodes have become more frequent but do improve with use of Nitroglycerin. Approximately 3 weeks ago he presented to the Emergency Department at [facility] with chest pain and was found to have a NSTEMI. Subsequent catheterization showed a patent LIMA to LAD, however his native coronaries and saphenous vein grafts to the diagonal and right coronary grafts, the graft to the distal circumflex was opened. It was felt no intervention could be offered at that time and the patient was treated with medical management. Patient states initially his angina symptoms improved. After his catheterization at [facility] he  traveled to [place] to visit with family and developed recurrent angina with increasing frequency. He took NTG which did not provide any  relief. Once patient arrived back to [facility] he presented to [facility] Emergency Department for further evaluation. Cardiac enzymes were negative. EKG showed some mild ST depression in the anterior leads. He was felt to have unstable angina and was admitted and placed on a Heparin drip and Lovenox therapy. At that time the films from [facility] were reviewed with total occlusion of the old heavily stented vein graft to the right, with poor distal flow. The question of distal disease in suitable targets was not resolved by the films. Since seen at the hospital the patient's symptoms have improved to some degree, he has had some occasional angina. He was able to do yard work without episodes of angina at the end of last week.  Since he was in the hospital the cath films from [facility] when he was cath last summer reviewed. At that time the vein graft to the right system was opened with an ostial stenosis, there was extensive filling of the right system with adequate targets for repeat bypass surgery in the right system.    Current Activity/ Functional Status:  Patient is independent with mobility/ambulation, transfers, ADL's, IADL's.  Zubrod Score: At the time of surgery this patient's most appropriate activity status/level should be described as: []  Normal activity, no symptoms [x]  Symptoms, fully ambulatory []  Symptoms, in bed less than or equal to 50% of the time []  Symptoms, in bed greater than  50% of the time but less than 100% []  Bedridden []  Moribund   Past Medical History  Diagnosis Date  . CAD (coronary artery disease)   . Hyperlipidemia   . Hypertension   . Arthritis     Gout  . GERD (gastroesophageal reflux disease)   . MI (myocardial infarction) 4/14  . Gout   . BPH (benign prostatic hypertrophy)   . Carotid artery disease     Right carotid stent 2013  . TIA (transient ischemic attack)     Hx: of  . H/O hiatal hernia   . Neuropathy     Hx: of in B/L toes      Past Surgical History  Procedure Laterality Date  . Coronary artery bypass graft  1995  . Foot surgery    . Coronary angioplasty with stent placement  2010  . Hernia repair      X 2   . Carotid stent insertion  2013    right at [facility]  . Tonsillectomy    . Colonoscopy      Hx: of  . Eye surgery      Hx: of left eye  . Cataract surgery      Hx: of both eyes  . Fracture surgery      Hx: of Left heel    Family History  Problem Relation Age of Onset  . Heart disease Mother   . Hyperlipidemia Father   . Hypertension Father   . Heart disease Father     History   Social History  . Marital Status: Married    Spouse Name: N/A    Number of Children: N/A  . Years of Education: N/A   Occupational History  . Not on file.   Social History Main Topics  . Smoking status: Former Smoker    Types: Cigarettes    Quit date: 04/29/1975  . Smokeless tobacco: Never Used  . Alcohol Use: Yes     Comment: Occasional drink  . Drug Use: No  . Sexually Active: Not Currently   Other Topics Concern  . 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 Former Smoker  . Types: Cigarettes  . Quit date: 04/29/1975  Smokeless tobacco  . Never Used    History  Alcohol Use  . Yes    Comment: Occasional drink     No Known Allergies  Current Facility-Administered Medications  Medication Dose Route Frequency Provider Last Rate Last Dose  . aminocaproic acid (AMICAR) 10 g in sodium chloride 0.9 % 100 mL infusion   Intravenous To OR Delight Ovens, MD      . cefUROXime (ZINACEF) 1.5 g in dextrose 5 % 50 mL IVPB  1.5 g Intravenous To OR Delight Ovens, MD      . cefUROXime (ZINACEF) 750 mg in dextrose 5 % 50 mL IVPB  750 mg Intravenous To OR Delight Ovens, MD      . chlorhexidine (HIBICLENS) 4 % liquid 2 application  30 mL Topical UD Delight Ovens, MD      . dexmedetomidine (PRECEDEX) 400 MCG/100ML  infusion  0.1-0.7 mcg/kg/hr Intravenous To OR Delight Ovens, MD      . DOPamine (INTROPIN) 800 mg in dextrose 5 % 250 mL infusion  2-20 mcg/kg/min Intravenous To OR Delight Ovens, MD      . EPINEPHrine (ADRENALIN) 4,000 mcg in dextrose 5 % 250  mL infusion  0.5-20 mcg/min Intravenous To OR Delight Ovens, MD      . heparin 2,500 Units, papaverine 30 mg in electrolyte-148 (PLASMALYTE-148) 500 mL irrigation   Irrigation To OR Delight Ovens, MD      . heparin 30,000 units/NS 1000 mL solution for CELLSAVER   Other To OR Delight Ovens, MD      . insulin regular (NOVOLIN R,HUMULIN R) 1 Units/mL in sodium chloride 0.9 % 100 mL infusion   Intravenous To OR Delight Ovens, MD      . magnesium sulfate (IV Push/IM) injection 40 mEq  40 mEq Other To OR Delight Ovens, MD      . metoprolol tartrate (LOPRESSOR) tablet 12.5 mg  12.5 mg Oral Once Delight Ovens, MD      . nitroGLYCERIN 0.2 mg/mL in dextrose 5 % infusion  2-200 mcg/min Intravenous To OR Delight Ovens, MD      . phenylephrine (NEO-SYNEPHRINE) 20,000 mcg in dextrose 5 % 250 mL infusion  30-200 mcg/min Intravenous To OR Delight Ovens, MD      . potassium chloride injection 80 mEq  80 mEq Other To OR Delight Ovens, MD      . vancomycin (VANCOCIN) 1,250 mg in sodium chloride 0.9 % 250 mL IVPB  1,250 mg Intravenous To OR Delight Ovens, MD       Facility-Administered Medications Ordered in Other Encounters  Medication Dose Route Frequency Provider Last Rate Last Dose  . fentaNYL (SUBLIMAZE) injection    Anesthesia Intra-op Quentin Ore, CRNA   100 mcg at 10/18/12 0645  . lactated ringers infusion    Continuous PRN Quentin Ore, CRNA      . midazolam (VERSED) 5 MG/5ML injection    Anesthesia Intra-op Quentin Ore, CRNA   2 mg at 10/18/12 0645       Review of Systems:     Cardiac Review of Systems: Y or N  Chest Pain [ y   ]  Resting SOB [ n  ] Exertional SOB  [ n ]  Orthopnea [ n ]   Pedal Edema [ n  ]      Palpitations [n ] Syncope  [ n]   Presyncope [  n ]  General Review of Systems: [Y] = yes [  ]=no Constitional: recent weight change [n  ]; anorexia [ n ]; fatigue [ y ]; nausea [ n ]; night sweats [n  ]; fever [n  ]; or chills [  n];                                                                                                                                          Dental: poor dentition[  ]; Last Dentist visit:   Eye : blurred vision [  ]; diplopia [   ]; vision changes [  ];  Amaurosis fugax[  ]; Resp: cough [  ];  wheezing[  ];  hemoptysis[  ]; shortness of breath[  ]; paroxysmal nocturnal dyspnea[  ]; dyspnea on exertion[  ]; or orthopnea[  ];  GI:  gallstones[  ], vomiting[  ];  dysphagia[  ]; melena[  ];  hematochezia [  ]; heartburn[  ];   Hx of  Colonoscopy[y  ]; GU: kidney stones [  ]; hematuria[  ];   dysuria [  ];  nocturia[  ];  history of     obstruction [  ]; urinary frequency [ n ]             Skin: rash, swelling[  ];, hair loss[  ];  peripheral edema[  ];  or itching[  ]; Musculosketetal: myalgias[  ];  joint swelling[  ];  joint erythema[  ];  joint pain[  ];  back pain[  ];  Heme/Lymph: bruising[  ];  bleeding[  ];  anemia[  ];  Neuro: TIA[  ];  headaches[  ];  stroke[  ];  vertigo[  ];  seizures[  ];   paresthesias[  ];  difficulty walking[ mild claudication in hips bilateral, and rt calf ];  Psych:depression[ n ]; anxiety[ n ];  Endocrine: diabetes[  ];  thyroid dysfunction[  ];  Immunizations: Flu [  y]; Pneumococcal[ y ];  Other:  Physical Exam: BP 132/62  Pulse 54  Temp(Src) 98.3 F (36.8 C) (Oral)  Resp 20  Wt 167 lb 3 oz (75.836 kg)  BMI 23.33 kg/m2  SpO2 96%  General appearance: alert, cooperative, appears stated age and no distress Neurologic: intact Heart: regular rate and rhythm, S1, S2 normal, no murmur, click, rub or gallop and normal apical impulse Lungs: clear to auscultation bilaterally and normal percussion bilaterally Abdomen: soft,  non-tender; bowel sounds normal; no masses,  no organomegaly Extremities: extremities normal, atraumatic, no cyanosis or edema, Homans sign is negative, no sign of DVT and Vein was previously harvested from the right ankle to the upper thigh with open technique Wound: History was stable and well healed The patient has no carotid bruits, though he does have known carotid disease and has had the right carotid stented, he has weakly palpable DP and PT pulses bilaterally   Diagnostic Studies & Laboratory data:     Recent Radiology Findings:  Dg Chest 2 View  10/15/2012   *RADIOLOGY REPORT*  Clinical Data: The patient for redo of CABG surgery.  CHEST - 2 VIEW  Comparison: 09/06/2012  Findings: The cardiac silhouette is normal in size.  There is a long right sided coronary artery stent, stable.  No mediastinal or hilar masses.  There are stable changes from the prior coronary artery bypass grafting surgery.  There is also left coronary artery stent.  The lungs are mildly hyperexpanded but clear.  No pleural effusion or pneumothorax.  The bony thorax is intact.  IMPRESSION: No acute cardiopulmonary disease.   Original Report Authenticated By: Amie Portland, M.D.    Recent Lab Findings: Lab Results  Component Value Date   WBC 6.6 10/15/2012   HGB 12.7* 10/15/2012   HCT 35.8* 10/15/2012   PLT 149* 10/15/2012   GLUCOSE 96 10/15/2012   CHOL  Value: 109        ATP III CLASSIFICATION:  <200     mg/dL   Desirable  956-213  mg/dL   Borderline High  >=086    mg/dL   High  04/18/2009   TRIG 114 04/18/2009   HDL 34* 04/18/2009   LDLCALC  Value: 52        Total Cholesterol/HDL:CHD Risk Coronary Heart Disease Risk Table                     Men   Women  1/2 Average Risk   3.4   3.3  Average Risk       5.0   4.4  2 X Average Risk   9.6   7.1  3 X Average Risk  23.4   11.0        Use the calculated Patient Ratio above and the CHD Risk Table to determine the patient's CHD Risk.        ATP III CLASSIFICATION (LDL):   <100     mg/dL   Optimal  409-811  mg/dL   Near or Above                    Optimal  130-159  mg/dL   Borderline  914-782  mg/dL   High  >956     mg/dL   Very High 21/30/8657   ALT 22 10/15/2012   AST 26 10/15/2012   NA 136 10/15/2012   K 4.6 10/15/2012   CL 104 10/15/2012   CREATININE 0.94 10/15/2012   BUN 22 10/15/2012   CO2 21 10/15/2012   INR 0.95 10/15/2012   HGBA1C 5.1 10/15/2012   Recent Cath [facility]:  [facility]  FINAL CARDIAC DIAGNOSTIC CATHETERIZATION REPORT  Adnan, Vanvoorhis MR #: [id number]   Procedure Date: 08/06/2012 Account #:      [id number] Attending:      Dellis Filbert Pu  Location:       7RT  Cath Attending: Steffanie Dunn, MD Cath Assistant: Fausto Skillern, MD  CARDIAC CATHETERIZATION PROCEDURE(S):    Left heart cath, Coronary Angiography and Left Ventriculogram, SVG and  Arterial Bypass (93459)     STUDY INDICATION(S):   Non-STEMI (410.71)     APPROACH:   Approach 1: Percutaneous right femoral artery     Sheath: 38F        CATHETERS: 38F JL4, 38F JR4, 38F Pig, 38F IM Diagnostic  Catheter Cordis   CONTRAST AGENT:   Omnipaque 350 125 cc   FLOURO TIME:      8.9 min     IN-LAB MEDICATIONS:  Medication         Dose  Units  Method  Time Given  Given By               0.9 Normal Saline  100   ml     IV      13:50       Duncan Dull RN    Versed             0.5   mg     IV      14:02       Duncan Dull RN    Lidocaine          5     ml     Hardy      14:03       Larose Kells MD   Fentanyl           25    mcg    IV      14:04       Duncan Dull RN    Versed  0.5   mg     IV      14:05       Duncan Dull RN         HEMODYNAMIC DATA:      Height:     71 in.      Weight:     160 lbs.  LEFT HEART CATHETERIZATION PROTOCOL  PRESSURES:      Site            Baseline Values, mm Hg      S/P Angiography, mm Hg                        S/D           Mean           S/D            Mean        LV             135 / 18                      137 / 16                     AO             125 / 54          81        130 / 49           81        OTHER HEMODYNAMIC INFORMATION:      Heart Rate:        65      CARDIOVASCULAR ANGIOGRAPHY  LEFT VENTRICULOGRAPHY:      LV ejection fraction = 40%      Regional Wall Motion:                  Anterobasal:      Mild Hypokinesis           Anterolateral:    Mild Hypokinesis           Apical:           Mild Hypokinesis           Apical Septal:    Mild Hypokinesis           Basal Septal:     Mild Hypokinesis           Diaphragmatic:    Mild Hypokinesis           Inferior Lateral: Akinesis           Posterobasal:     Mild Hypokinesis           Posterolateral:   Mild Hypokinesis           Superior Lateral: Mild Hypokinesis      AORTOGRAPHY:      Aortogram Type:       thoracic aorta  Comments: Iliac angiogram: Moderate distal aortic disease. Moderate bilateral  common iliiacs plaque. 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 The left  common iliac 25% lesion. 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 No flow limiting lesions.       CORONARY ANGIOGRAPHY:  Vessel Segment  Vessel Type  Stenosis (%)  Description  Comment   Mid Circumflex  Native       100  Mid LAD         Native       100                                  Distal LAD      Native       25            Diffuse                      Ramus Present:       No Coronary Dominance:  Left  BYPASS GRAFT ANGIOGRAPHY:  Material Type                  Stenosis (%)  1st Insertion  Sten (%)  2nd  Insertion  Comment   Left Internal Mammary Artery   0             Mid LAD                                            Saphenous Vein                 0             1st Marginal                                       Saphenous Vein                 100           Mid RCA                                            Saphenous Vein                 100           1st Diagonal                               CONCLUSIONS:       CORONARY STATUS: Obstructive 3 vessel        LV FUNCTION:              Ejection Fraction:   40%              Wall Motion:         Abnormal           OTHER:  LM nl LAD mid 100%, LIMA patent, SVG to (?) diag 100% LCX mid 100%, SVG to OM patent with small native OM RCA 100%, SVG 100%             I was present for key elements of the procedure; personally performed and/or directly supervised it.  I have personally interpreted the angiograms.  I also attest that all medications given and all point of care testing performed during the procedure were ordered by me.       Electronically Finalized by:    Steffanie Dunn, MD   on 08/16/2012  2:19:06 PM   Renato Modena Nunnery, MD Cardiac Catheterization Attending (Finalized appears above)   Electronically approved by: Fausto Skillern, MD  on 08/09/2012 12:59:17  PM    Fausto Skillern, MD Cardiac Catheterization Assistant (Approved appears above)  Report Creation Date:  08/06/2012 Report ID: [id number]   Assessment / Plan:    Recurrent angina with progression of native and graft disease, with suitable targets in the totally occluded right system for bypass Cerebrovascular disease Peripheral vascular disease, with lower extremity indices in the past of 50-60% 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 Over all his health condition is good and his activity level is also good. I have recommended to him redo coronary artery bypass grafting for relief of symptoms. The risks and options of redo surgery have been discussed with him.  After discussion with Dr Donnie Aho the patient has decided to proceed with redo CABG. The goals risks and alternatives of the planned surgical procedure redo cabg  have been  discussed with the patient in detail. The risks of the procedure including death, infection, stroke, myocardial infarction, bleeding, blood transfusion have all been discussed specifically.  I have quoted Adolphus Birchwood. a 6 % of perioperative mortality and a complication rate as high as 25 %. The patient's questions have been answered.Adolphus Birchwood. is willing  to proceed with the planned procedure.  Delight Ovens MD  Beeper [phone] Office [phone] 10/18/2012 7:19 AM

## 2012-10-18 NOTE — Transfer of Care (Signed)
Immediate Anesthesia Transfer of Care Note  Patient: Ian Cummings.  Procedure(s) Performed: Procedure(s) with comments: REDO CORONARY ARTERY BYPASS GRAFTING (CABG) (N/A) - off pump times two using endoscopically harvested left saphenous vein INTRAOPERATIVE TRANSESOPHAGEAL ECHOCARDIOGRAM (N/A)  Patient Location: SICU  Anesthesia Type:General  Level of Consciousness: sedated and unresponsive  Airway & Oxygen Therapy: Patient remains intubated per anesthesia plan and Patient placed on Ventilator (see vital sign flow sheet for setting)  Post-op Assessment: Report given to PACU RN and Post -op Vital signs reviewed and stable  Post vital signs: Reviewed and stable  Complications: No apparent anesthesia complications

## 2012-10-18 NOTE — CV Procedure (Signed)
Intraoperative Transesophageal Echocardiography Report:  [name] is an 79 year old male with coronary artery disease who is scheduled for redo coronary artery bypass grafting by Dr. Tyrone Sage. He originally underwent CABG in 1995 by Dr. Tyrone Sage. Since then he has had multiple stents to the saphenous vein graft and native coronary arteries. He suffered a non-STEMI 3 weeks ago and is now scheduled for redo coronary artery bypass grafting. Transesophageal echocardiography was requested to evaluate the right and left ventricular function, to determine if any valvular pathology was present, and to serve as a monitor for intraoperative on status.  The patient was brought to the operating room at [facility] and general anesthesia was induced without difficulty. Following endotracheal intubation and orogastric suctioning, the transesophageal echocardiography probe was inserted into the esophagus without difficulty.  Impression: Pre-incision findings:  1. Aortic valve: The aortic leaflets were mildly thickened but opened normally. There was no aortic insufficiency.  2 Mitral valve: There was mild to moderate mitral insufficiency. The mitral annulus was dilated with  lateral displacement of the papillary muscles. There was apical displacement the coaptation point. There  were no flail or prolapsing segments noted. As a central jet of mitral insufficiency which was graded as mild To moderate. The mitral valve regurgitant orifice area was 0.12 cm using the PISA method. The regurgitant volume was 28 mm. The vena contracta measured 0.4 cm.  3. Left ventricle: There was mild dilatation of the left ventricular cavity. There was hypokinesis noted in the inferior wall but there were no akinetic segments noted. Ejection fraction was estimated at 50%. Left ventricular end-diastolic diameter measured 5.5 cm at end diastole at the mid-papillary level in the transgastric short axis view. There was mild  concentric left ventricular hypertrophy. Left ventricular wall thickness measured 1.1-1.2 cm at end diastole at the mid-papillary level in the transgastric short axis view. There was no thrombus noted in the left ventricular apex  4. Right ventricle: The right ventricular cavity was mildly enlarged. There was good contractility the right ventricular free wall and otherwise normal appearing right ventricular function  5. Tricuspid valve: The tricuspid valve appeared structurally normal with 1+ tricuspid insufficiency.  6. Interatrial septum: There was no patent foramen ovale or atrial septal defect by color Doppler.  7.  Left atrium: There was no thrombus noted in the left atrial cavity or left atrial appendage.  8. Ascending aorta: The  ascending aorta showed a well-defined aortic root and sinotubular ridge without effacement. There was moderate calcification noted in the region of the right sinus of Valsalva. There were no mobile atherosclerotic plaques noted.  9. Descending aorta: The descending aorta showed mild at calcification in the LAD and measured 2.25 cm  Post-bypass grafting study.  1. Aortic valve: The aortic valve was unchanged from the pre-bypass study. There was no aortic insufficiency and the leaflets opened normally.  2. Mitral valve: The mitral valve appeared unchanged from the pre-bypass study. There was mild to moderate mitral insufficiency with a central jet which appeared due to annular enlargement and lateral papillary muscle displacement.  3. Left ventricle: The left ventricular function appeared unchanged in the pre-bypass grafting study. There was mild left ventricular enlargement. There was mild hypokinesis of the inferior wall and mild generalized hypokinesis. Ejection fraction was estimated at 50%.  4. Right ventricle: The right ventricular free wall appeared to be contracting normally. The right ventricular cavity appeared mildly enlarged.  Kipp Brood, M.D.

## 2012-10-18 NOTE — Procedures (Signed)
Extubation Procedure Note  Patient Details:   Name: Ian Cummings. DOB: [birth year] MRN: [id number]   Patient extubated per protocol. Able to vocalize. No stridor noted. VS WNL. Patient tolerating well. RT to monitor.     Evaluation  O2 sats: stable throughout Complications: No apparent complications Patient did tolerate procedure well. Bilateral Breath Sounds: Diminished   Yes  Tawni Pummel 10/18/2012, 6:10 PM

## 2012-10-18 NOTE — Anesthesia Preprocedure Evaluation (Addendum)
Anesthesia Evaluation  Patient identified by MRN, date of birth, ID band Patient awake    Reviewed: Allergy & Precautions, H&P , NPO status , Patient's Chart, lab work & pertinent test results, reviewed documented beta blocker date and time   Airway Mallampati: II      Dental  (+) Teeth Intact   Pulmonary former smoker,  breath sounds clear to auscultation        Cardiovascular hypertension, Pt. on home beta blockers + angina + CAD, + Past MI and + Peripheral Vascular Disease Rhythm:Regular Rate:Normal     Neuro/Psych    GI/Hepatic hiatal hernia, GERD-  Controlled and Medicated,  Endo/Other    Renal/GU      Musculoskeletal   Abdominal   Peds  Hematology   Anesthesia Other Findings   Reproductive/Obstetrics                         Anesthesia Physical Anesthesia Plan  ASA: III  Anesthesia Plan: General   Post-op Pain Management:    Induction: Intravenous  Airway Management Planned: Oral ETT  Additional Equipment: Arterial line, CVP, PA Cath and 3D TEE  Intra-op Plan:   Post-operative Plan: Post-operative intubation/ventilation  Informed Consent: I have reviewed the patients History and Physical, chart, labs and discussed the procedure including the risks, benefits and alternatives for the proposed anesthesia with the patient or authorized representative who has indicated his/her understanding and acceptance.   Dental advisory given  Plan Discussed with: CRNA and Anesthesiologist  Anesthesia Plan Comments:         Anesthesia Quick Evaluation

## 2012-10-18 NOTE — Preoperative (Signed)
Beta Blockers   Reason not to administer Beta Blockers:Not Applicable 

## 2012-10-19 ENCOUNTER — Inpatient Hospital Stay (HOSPITAL_COMMUNITY): Payer: Medicare HMO

## 2012-10-19 LAB — BASIC METABOLIC PANEL
BUN: 17 mg/dL (ref 6–23)
Chloride: 102 mEq/L (ref 96–112)
GFR calc Af Amer: >90 mL/min (ref >90)
GFR calc non Af Amer: 79 mL/min — ABNORMAL LOW (ref >90)
Potassium: 4.7 mEq/L (ref 3.5–5.1)
Sodium: 135 mEq/L (ref 135–145)

## 2012-10-19 LAB — CREATININE, SERUM
Creatinine, Ser: 0.97 mg/dL (ref 0.50–1.35)
GFR calc Af Amer: 87 mL/min — ABNORMAL LOW (ref >90)
GFR calc non Af Amer: 75 mL/min — ABNORMAL LOW (ref >90)

## 2012-10-19 LAB — GLUCOSE, CAPILLARY
Glucose-Capillary: 103 mg/dL — ABNORMAL HIGH (ref 70–99)
Glucose-Capillary: 127 mg/dL — ABNORMAL HIGH (ref 70–99)
Glucose-Capillary: 112 mg/dL — ABNORMAL HIGH (ref 70–99)
Glucose-Capillary: 126 mg/dL — ABNORMAL HIGH (ref 70–99)
Glucose-Capillary: 130 mg/dL — ABNORMAL HIGH (ref 70–99)
Glucose-Capillary: 130 mg/dL — ABNORMAL HIGH (ref 70–99)

## 2012-10-19 LAB — CBC
HCT: 33.7 % — ABNORMAL LOW (ref 39.0–52.0)
Hemoglobin: 11.5 g/dL — ABNORMAL LOW (ref 13.0–17.0)
MCHC: 34.1 g/dL (ref 30.0–36.0)
MCV: 92.5 fL (ref 78.0–100.0)
Platelets: 110 10*3/uL — ABNORMAL LOW (ref 150–400)
RBC: 3.46 MIL/uL — ABNORMAL LOW (ref 4.22–5.81)
RDW: 14.4 % (ref 11.5–15.5)
RDW: 14.7 % (ref 11.5–15.5)
WBC: 8.8 10*3/uL (ref 4.0–10.5)
WBC: 10 10*3/uL (ref 4.0–10.5)

## 2012-10-19 LAB — POCT I-STAT, CHEM 8
BUN: 17 mg/dL (ref 6–23)
Calcium, Ion: 1.24 mmol/L (ref 1.13–1.30)
Chloride: 98 mEq/L (ref 96–112)
Creatinine, Ser: 1 mg/dL (ref 0.50–1.35)
Glucose, Bld: 131 mg/dL — ABNORMAL HIGH (ref 70–99)
HCT: 32 % — ABNORMAL LOW (ref 39.0–52.0)
Hemoglobin: 10.9 g/dL — ABNORMAL LOW (ref 13.0–17.0)
Potassium: 4.3 mEq/L (ref 3.5–5.1)
Sodium: 134 mEq/L — ABNORMAL LOW (ref 135–145)
TCO2: 27 mmol/L (ref 0–100)

## 2012-10-19 LAB — MAGNESIUM: Magnesium: 2.2 mg/dL (ref 1.5–2.5)

## 2012-10-19 MED ORDER — MUPIROCIN 2 % EX OINT
1.0000 "application " | TOPICAL_OINTMENT | Freq: Two times a day (BID) | CUTANEOUS | Status: AC
  Administered 2012-10-19 – 2012-10-23 (×10): 1 via NASAL
  Filled 2012-10-19: qty 22

## 2012-10-19 MED ORDER — ENOXAPARIN SODIUM 30 MG/0.3ML ~~LOC~~ SOLN
30.0000 mg | SUBCUTANEOUS | Status: DC
  Administered 2012-10-19 – 2012-10-23 (×5): 30 mg via SUBCUTANEOUS
  Filled 2012-10-19 (×6): qty 0.3

## 2012-10-19 MED ORDER — INSULIN ASPART 100 UNIT/ML ~~LOC~~ SOLN: 0.0000 [IU] | SUBCUTANEOUS | Status: DC

## 2012-10-19 MED ORDER — CHLORHEXIDINE GLUCONATE CLOTH 2 % EX PADS
6.0000 | MEDICATED_PAD | Freq: Every day | CUTANEOUS | Status: AC
  Administered 2012-10-19 – 2012-10-23 (×5): 6 via TOPICAL

## 2012-10-19 MED ORDER — CLOPIDOGREL BISULFATE 75 MG PO TABS
75.0000 mg | ORAL_TABLET | Freq: Every day | ORAL | Status: DC
  Administered 2012-10-19 – 2012-10-24 (×6): 75 mg via ORAL
  Filled 2012-10-19 (×7): qty 1

## 2012-10-19 NOTE — Progress Notes (Signed)
Anesthesiology Follow-up:  Awake and alert, in good spirits, sitting in chair eating dinner,   VS: T-36.7 BP-137/48 RR-18 HR 66 (SR)  H/H 11.1/32.1 Plts: 110,000 BUN/Cr. 17/1.0 K-4.3 glucose 131  Extubated 5 hours post-op. One day S/P off-pump redo CABG, stable post-op course.  Kipp Brood, MD

## 2012-10-19 NOTE — Progress Notes (Signed)
Patient ID: Ian Cummings., male   DOB: 1928/11/02, 79 y.o.   MRN: [id number] TCTS DAILY ICU PROGRESS NOTE                   [address]            [place] [address]          [phone]   1 Day Post-Op Procedure(s) (LRB): REDO CORONARY ARTERY BYPASS GRAFTING (CABG) (N/A) INTRAOPERATIVE TRANSESOPHAGEAL ECHOCARDIOGRAM (N/A)  Total Length of Stay:  LOS: 1 day   Subjective: Awake and alert, neuro intact extubated last night  Objective: Vital signs in last 24 hours: Temp:  [92.7 F (33.7 C)-99.7 F (37.6 C)] 99.3 F (37.4 C) (06/24 0700) Pulse Rate:  [79-80] 80 (06/24 0700) Cardiac Rhythm:  [-] Atrial paced (06/23 2000) Resp:  [13-24] 15 (06/24 0700) BP: (89-130)/(50-65) 122/54 mmHg (06/24 0700) SpO2:  [92 %-100 %] 95 % (06/24 0700) Arterial Line BP: (89-153)/(39-67) 136/52 mmHg (06/24 0700) FiO2 (%):  [40 %-60 %] 40 % (06/23 1745) Weight:  [170 lb 10.2 oz (77.4 kg)] 170 lb 10.2 oz (77.4 kg) (06/24 0500)  Filed Weights   10/17/12 1300 10/19/12 0500  Weight: 167 lb 3 oz (75.836 kg) 170 lb 10.2 oz (77.4 kg)    Weight change: 3 lb 7.2 oz (1.564 kg)   Hemodynamic parameters for last 24 hours: PAP: (34-48)/(15-26) 41/20 mmHg CO:  [5.4 L/min-6.5 L/min] 5.8 L/min CI:  [2.8 L/min/m2-3.3 L/min/m2] 3 L/min/m2  Intake/Output from previous day: 06/23 0701 - 06/24 0700 In: 4403.7 [I.V.:3746.7; Blood:107; IV Piggyback:550] Out: 2295 [Urine:1580; Blood:325; Chest Tube:390]  Intake/Output this shift:    Current Meds: Scheduled Meds: . acetaminophen  1,000 mg Oral Q6H   Or  . acetaminophen (TYLENOL) oral liquid 160 mg/5 mL  975 mg Per Tube Q6H  . allopurinol  300 mg Oral Daily  . aspirin EC  325 mg Oral Daily   Or  . aspirin  324 mg Per Tube Daily  . atorvastatin  80 mg Oral q1800  . bisacodyl  10 mg Oral Daily   Or  . bisacodyl  10 mg Rectal Daily  . cefUROXime (ZINACEF)  IV  1.5 g Intravenous Q12H  . Chlorhexidine Gluconate Cloth  6 each Topical  Daily  . cholecalciferol  2,000 Units Oral Daily  . docusate sodium  200 mg Oral Daily  . famotidine (PEPCID) IV  20 mg Intravenous Q12H  . insulin aspart  0-24 Units Subcutaneous Q4H  . insulin regular  0-10 Units Intravenous TID WC  . metoprolol tartrate  12.5 mg Oral BID   Or  . metoprolol tartrate  12.5 mg Per Tube BID  . multivitamin  1 tablet Oral Daily  . mupirocin ointment  1 application Nasal BID  . [START ON 10/20/2012] pantoprazole  40 mg Oral Daily  . PARoxetine  20 mg Oral BH-q7a  . ranolazine  500 mg Oral BID  . sodium chloride  3 mL Intravenous Q12H  . vitamin B-12  1,000 mcg Oral Daily   Continuous Infusions: . sodium chloride 20 mL/hr at 10/18/12 1339  . sodium chloride 20 mL/hr at 10/18/12 1300  . sodium chloride    . sodium chloride    . dexmedetomidine Stopped (10/18/12 1700)  . insulin (NOVOLIN-R) infusion Stopped (10/18/12 1800)  . lactated ringers 20 mL/hr at 10/18/12 1300  . nitroGLYCERIN Stopped (10/18/12 1300)  . phenylephrine (NEO-SYNEPHRINE) Adult infusion Stopped (10/18/12 2300)   PRN Meds:.albumin  human, metoprolol, midazolam, morphine injection, ondansetron (ZOFRAN) IV, oxyCODONE, sodium chloride  General appearance: alert, cooperative and no distress Neurologic: intact Heart: regular rate and rhythm, S1, S2 normal, no murmur, click, rub or gallop and normal apical impulse Lungs: clear to auscultation bilaterally and normal percussion bilaterally Abdomen: soft, non-tender; bowel sounds normal; no masses,  no organomegaly Extremities: extremities normal, atraumatic, no cyanosis or edema Wound: sternum stable  Lab Results: CBC: Recent Labs  10/18/12 1855 10/18/12 1901 10/19/12 0500  WBC 9.6  --  8.8  HGB 11.2* 15.0 11.5*  HCT 31.7* 44.0 33.7*  PLT 126*  --  118*   BMET:  Recent Labs  10/18/12 1901 10/19/12 0500  NA 141 135  K 3.9 4.7  CL 108 102  CO2  --  27  GLUCOSE 87 121*  BUN 19 17  CREATININE 0.90 0.87  CALCIUM  --  8.0*      PT/INR:  Recent Labs  10/18/12 1313  LABPROT 14.8  INR 1.18   Radiology: Dg Chest Portable 1 View In Am  10/19/2012   *RADIOLOGY REPORT*  Clinical Data: Postop CABG.  PORTABLE CHEST - 1 VIEW  Comparison: 10/18/2012  Findings: Interval extubation and removal of NG tube.  Swan-Ganz catheter and right chest tube remain in place.  Small right apical pneumothorax cardiomegaly.  Bibasilar atelectasis and small effusions.  IMPRESSION: Interval extubation.  Bibasilar atelectasis and small effusions.  Small right apical pneumothorax.   Original Report Authenticated By: Charlett Nose, M.D.   Dg Chest Portable 1 View  10/18/2012   *RADIOLOGY REPORT*  Clinical Data: Postop CABG  PORTABLE CHEST - 1 VIEW  Comparison: 10/15/2012  Findings: There has been previous median sternotomy and CABG. Endotracheal tube has its tip 5 cm above the carina.  Nasogastric tube enters the stomach.  Swan-Ganz catheter has its tip the right main pulmonary artery.  Right-sided chest tube is in place.  No pneumothorax.  There is moderate atelectasis in the left lower lobe.  IMPRESSION: Lines and tubes well positioned.  No pneumothorax.  Moderate left lower lobe volume loss.   Original Report Authenticated By: Paulina Fusi, M.D.     Assessment/Plan: S/P Procedure(s) (LRB): REDO CORONARY ARTERY BYPASS GRAFTING (CABG) (N/A) INTRAOPERATIVE TRANSESOPHAGEAL ECHOCARDIOGRAM (N/A) Mobilize Diuresis d/c tubes/lines See progression orders Expected Acute  Blood - loss Anemia Resume Plavix     Kimbree Casanas B 10/19/2012 7:42 AM

## 2012-10-19 NOTE — Progress Notes (Signed)
Patient ID: Ian Cummings., male   DOB: 1929-03-08, 79 y.o.   MRN: [id number]  SICU Evening Rounds:  Hemodynamically stable [place]. Ambulated twice today.  CBC    Component Value Date/Time   WBC 10.0 10/19/2012 1600   RBC 3.46* 10/19/2012 1600   HGB 10.9* 10/19/2012 1634   HCT 32.0* 10/19/2012 1634   PLT 110* 10/19/2012 1600   MCV 92.5 10/19/2012 1600   MCH 32.1 10/19/2012 1600   MCHC 34.7 10/19/2012 1600   RDW 14.7 10/19/2012 1600   LYMPHSABS 1.1 04/17/2009 1451   MONOABS 0.7 04/17/2009 1451   EOSABS 0.1 04/17/2009 1451   BASOSABS 0.0 04/17/2009 1451    BMET    Component Value Date/Time   NA 134* 10/19/2012 1634   K 4.3 10/19/2012 1634   CL 98 10/19/2012 1634   CO2 27 10/19/2012 0500   GLUCOSE 131* 10/19/2012 1634   BUN 17 10/19/2012 1634   CREATININE 1.00 10/19/2012 1634   CALCIUM 8.0* 10/19/2012 0500   GFRNONAA 75* 10/19/2012 1600   GFRAA 87* 10/19/2012 1600    Stable day.

## 2012-10-19 NOTE — Op Note (Signed)
Ian Cummings, Ian Cummings NO.:  [id number]  MEDICAL RECORD NO.:  [id number]  LOCATION:  2303                         FACILITY:  [facility]  PHYSICIAN:  Sheliah Plane, MD    DATE OF BIRTH:  07-25-1928  DATE OF PROCEDURE:  10/18/2012 DATE OF DISCHARGE:                              OPERATIVE REPORT   PREOPERATIVE DIAGNOSIS:  Unstable angina with recurrent coronary occlusive disease.  POSTOPERATIVE DIAGNOSIS:  Unstable angina with recurrent coronary occlusive disease.  PROCEDURE:  Off-pump redo coronary artery bypass grafting x1 with reverse saphenous vein graft to the right coronary system with left thigh endovein harvesting.  SURGEON:  Sheliah Plane, MD  FIRST ASSISTANT:  Coral Ceo, PA  BRIEF HISTORY:  The patient is an 79 year old male who 19 years previously had done coronary artery bypass grafting x7.  Over the years, the patient had done well until the last several years when he had graft disease especially of the graft to the right system and multiple stents have been placed.  In July of 2013, he had unstable anginal symptoms and was seen in [place], underwent cardiac catheterization in [place], [place].  At that time he had a diffusely distended stented right graft with a native vessel was totally occluded.  The stented vessel had ostial stenosis.  There was also disease in a vein graft to a small circ system.  At that time, an angioplasty of the vein graft to the circumflex system was extended.  The patient returned from his trip to [place] and did well until since April 2014 when he presented to [facility] with recurrent unstable anginal symptoms.  A cardiac catheterization was done there demonstrating total occlusion of the right coronary graft.  The graft to the circ was patent.  The left internal mammary artery was patent.  The patient was treated medically by his physician at [facility] and was discharged home.  He was not satisfied with  this and sought the opinion of Dr. Donnie Aho who obtained the films from [place], [place], and upon review of these, it appeared that, there was a significant sized right system that could be bypass. Circumflex system appeared relatively.  The patient continued to have anginal symptoms.  Coronary artery bypass grafting was offered to him for relief of symptoms.  He agreed and signed informed consent.  DESCRIPTION OF PROCEDURE:  After appropriate time-out, the patient underwent general endotracheal anesthesia without incidence.  Skin of the chest and legs prepped with Betadine and draped in usual sterile manner.  Using a [employer] endovein harvesting system, vein was harvested from the left thigh and was of good quality and caliber.  A TEE probe had been placed by Dr. Laural Benes.  This showed mild mitral regurgitation and inferior hypokinesis, but with relatively well-preserved LV function, ejection fraction over 40%.  The vein was harvested endoscopically from the left thigh and was of good quality.  Median sternotomy was performed with sagittal saw, taking care not to injure underlying structures.  With some time of tedious dissection, the ascending aorta was isolated, the right atrium was isolated, the previously placed vein graft to the right coronary artery was dissected and it was obvious.  At  this point, it became apparent that we could bypass the right system off pump avoiding dissecting significant amount of left myocardium risking injury to the mammary artery, so an attempt to be made, no attempt to bypass the small circ system as this was not felt that this was significant enough to warrant dissecting out the entire left side of the heart.  The patient was systemically heparinized.  Partial occlusion clamp was placed on the ascending aorta. Single punch aortotomy was created and the vein graft was anastomosed to the ascending aorta proximal anastomosis first.  The previous right  vein graft was a sequential graft placed to the PD and two PL branches.  With the suction stabilization device, the acute margin of the heart was stabilized.  This gave good exposure and the vein graft was opened right at the takeoff of the posterolateral anastomosis.  At this area, the vein graft did not appear diseased.  There was slightly thickened to trimming the vein to the appropriate length.  A running 7-0 Prolene was used for the distal anastomosis.  Air was flushed from the graft and the anastomosis completed.  Bulldog on the vein graft was removed.  The patient remained hemodynamically stable throughout the procedure. Atrial and ventricular pacing wires had been applied.  He was then paced at 80 to increase his rate.  His native rate was 38-40.  Protamine sulfate was administered with operative field hemostatic.  The pericardium was loosely reapproximated.  A right pleural tube and a Blake mediastinal drain were left in place.  Sternum closed with #6 stainless steel wire.  Fascia was closed with interrupted 0 Vicryl, running 3-0 Vicryl subcutaneous tissue, and 4-0 subcuticular stitch in skin edges.  Dry dressings were applied.  Sponge and needle count was reported as correct at completion of the procedure.  The patient tolerated the procedure without obvious complication and was transferred to the Surgical Intensive Care Unit for further postoperative care.  He required no blood products during the procedure.     Sheliah Plane, MD     EG/MEDQ  D:  10/19/2012  T:  10/19/2012  Job:  [id number]

## 2012-10-20 ENCOUNTER — Inpatient Hospital Stay (HOSPITAL_COMMUNITY): Payer: Medicare HMO

## 2012-10-20 LAB — BASIC METABOLIC PANEL
BUN: 18 mg/dL (ref 6–23)
CO2: 27 mEq/L (ref 19–32)
Calcium: 8.3 mg/dL — ABNORMAL LOW (ref 8.4–10.5)
Chloride: 96 mEq/L (ref 96–112)
Creatinine, Ser: 0.94 mg/dL (ref 0.50–1.35)
GFR calc Af Amer: 88 mL/min — ABNORMAL LOW (ref >90)
GFR calc non Af Amer: 76 mL/min — ABNORMAL LOW (ref >90)
Glucose, Bld: 118 mg/dL — ABNORMAL HIGH (ref 70–99)
Potassium: 4.4 mEq/L (ref 3.5–5.1)
Sodium: 131 mEq/L — ABNORMAL LOW (ref 135–145)

## 2012-10-20 LAB — GLUCOSE, CAPILLARY
Glucose-Capillary: 102 mg/dL — ABNORMAL HIGH (ref 70–99)
Glucose-Capillary: 122 mg/dL — ABNORMAL HIGH (ref 70–99)
Glucose-Capillary: 105 mg/dL — ABNORMAL HIGH (ref 70–99)
Glucose-Capillary: 132 mg/dL — ABNORMAL HIGH (ref 70–99)
Glucose-Capillary: 108 mg/dL — ABNORMAL HIGH (ref 70–99)
Glucose-Capillary: 126 mg/dL — ABNORMAL HIGH (ref 70–99)

## 2012-10-20 LAB — CBC
HCT: 32.4 % — ABNORMAL LOW (ref 39.0–52.0)
Hemoglobin: 11.2 g/dL — ABNORMAL LOW (ref 13.0–17.0)
MCH: 31.9 pg (ref 26.0–34.0)
MCHC: 34.6 g/dL (ref 30.0–36.0)
MCV: 92.3 fL (ref 78.0–100.0)
Platelets: 127 10*3/uL — ABNORMAL LOW (ref 150–400)
RBC: 3.51 MIL/uL — ABNORMAL LOW (ref 4.22–5.81)
RDW: 14.6 % (ref 11.5–15.5)
WBC: 11.7 10*3/uL — ABNORMAL HIGH (ref 4.0–10.5)

## 2012-10-20 MED ORDER — SODIUM CHLORIDE 0.9 % IJ SOLN: 3.0000 mL | INTRAMUSCULAR | Status: DC | PRN

## 2012-10-20 MED ORDER — SODIUM CHLORIDE 0.9 % IV SOLN: 250.0000 mL | INTRAVENOUS | Status: DC | PRN

## 2012-10-20 MED ORDER — INSULIN ASPART 100 UNIT/ML ~~LOC~~ SOLN
0.0000 [IU] | Freq: Three times a day (TID) | SUBCUTANEOUS | Status: DC
  Administered 2012-10-20 – 2012-10-21 (×3): 2 [IU] via SUBCUTANEOUS

## 2012-10-20 MED ORDER — PANTOPRAZOLE SODIUM 40 MG PO TBEC
40.0000 mg | DELAYED_RELEASE_TABLET | Freq: Every day | ORAL | Status: DC
  Administered 2012-10-21 – 2012-10-24 (×4): 40 mg via ORAL
  Filled 2012-10-20 (×5): qty 1

## 2012-10-20 MED ORDER — BISACODYL 5 MG PO TBEC: 10.0000 mg | DELAYED_RELEASE_TABLET | Freq: Every day | ORAL | Status: DC | PRN

## 2012-10-20 MED ORDER — OXYCODONE HCL 5 MG PO TABS
5.0000 mg | ORAL_TABLET | ORAL | Status: DC | PRN
  Administered 2012-10-20 – 2012-10-21 (×5): 10 mg via ORAL
  Filled 2012-10-20 (×5): qty 2

## 2012-10-20 MED ORDER — AMIODARONE HCL IN DEXTROSE 360-4.14 MG/200ML-% IV SOLN
30.0000 mg/h | INTRAVENOUS | Status: DC
  Administered 2012-10-21: 30 mg/h via INTRAVENOUS
  Filled 2012-10-20 (×9): qty 200

## 2012-10-20 MED ORDER — DOCUSATE SODIUM 100 MG PO CAPS
200.0000 mg | ORAL_CAPSULE | Freq: Every day | ORAL | Status: DC
  Administered 2012-10-21 – 2012-10-24 (×4): 200 mg via ORAL
  Filled 2012-10-20 (×4): qty 2

## 2012-10-20 MED ORDER — ASPIRIN EC 325 MG PO TBEC
325.0000 mg | DELAYED_RELEASE_TABLET | Freq: Every day | ORAL | Status: DC
  Administered 2012-10-21 – 2012-10-24 (×4): 325 mg via ORAL
  Filled 2012-10-20 (×5): qty 1

## 2012-10-20 MED ORDER — TRAMADOL HCL 50 MG PO TABS
50.0000 mg | ORAL_TABLET | ORAL | Status: DC | PRN
  Administered 2012-10-20 – 2012-10-24 (×7): 100 mg via ORAL
  Filled 2012-10-20 (×7): qty 2

## 2012-10-20 MED ORDER — MOVING RIGHT ALONG BOOK
Freq: Once | Status: AC
  Administered 2012-10-20: 11:00:00
  Filled 2012-10-20: qty 1

## 2012-10-20 MED ORDER — BISACODYL 10 MG RE SUPP: 10.0000 mg | Freq: Every day | RECTAL | Status: DC | PRN

## 2012-10-20 MED ORDER — AMIODARONE HCL IN DEXTROSE 360-4.14 MG/200ML-% IV SOLN
60.0000 mg/h | INTRAVENOUS | Status: AC
  Administered 2012-10-20: 60 mg/h via INTRAVENOUS
  Filled 2012-10-20 (×2): qty 200

## 2012-10-20 MED ORDER — SODIUM CHLORIDE 0.9 % IJ SOLN
3.0000 mL | Freq: Two times a day (BID) | INTRAMUSCULAR | Status: DC
  Administered 2012-10-22 – 2012-10-23 (×3): 3 mL via INTRAVENOUS

## 2012-10-20 MED ORDER — ACETAMINOPHEN 325 MG PO TABS: 650.0000 mg | ORAL_TABLET | Freq: Four times a day (QID) | ORAL | Status: DC | PRN

## 2012-10-20 MED ORDER — ALUM & MAG HYDROXIDE-SIMETH 200-200-20 MG/5ML PO SUSP
15.0000 mL | ORAL | Status: DC | PRN
  Administered 2012-10-20: 30 mL via ORAL
  Filled 2012-10-20: qty 30

## 2012-10-20 MED FILL — Potassium Chloride Inj 2 mEq/ML: INTRAVENOUS | Qty: 40 | Status: AC

## 2012-10-20 MED FILL — Heparin Sodium (Porcine) Inj 1000 Unit/ML: INTRAMUSCULAR | Qty: 30 | Status: AC

## 2012-10-20 MED FILL — Magnesium Sulfate Inj 50%: INTRAMUSCULAR | Qty: 10 | Status: AC

## 2012-10-20 MED FILL — Sodium Chloride IV Soln 0.9%: INTRAVENOUS | Qty: 1000 | Status: AC

## 2012-10-20 NOTE — Progress Notes (Signed)
Pt transferred to room 2018; ambulated 300 feet, sat in wheelchair rest of the way.  Pt positioned comfortably into bed on 3L [place], belongings in room.  Left in the care of Raven, RN, who received report on pt.  Vitals stable throughout transfer.  Tolerated transfer well.

## 2012-10-20 NOTE — Plan of Care (Signed)
Problem: Phase III Progression Outcomes Goal: Transfer to PCTU/Telemetry POD Outcome: Completed/Met Date Met:  10/20/12 10/20/2012  At 1435

## 2012-10-20 NOTE — Progress Notes (Signed)
Pt converted back to NSR 70s-80s

## 2012-10-20 NOTE — Consult Note (Signed)
  Amiodarone Drug - Drug Interaction Consult Note  Recommendations: Amiodarone is metabolized by the cytochrome P450 system and therefore has the potential to cause many drug interactions. Amiodarone has an average plasma half-life of 50 days (range 20 to 100 days). Avoid Grapefruit juice.   There is potential for drug interactions to occur several weeks or months after stopping treatment and the onset of drug interactions may be slow after initiating amiodarone.   [x]  Statins: Increased risk of myopathy. Simvastatin- restrict dose to 20mg  daily. Other statins: counsel patients to report any muscle pain or weakness immediately.  []  Anticoagulants: Amiodarone can increase anticoagulant effect. Consider warfarin dose reduction. Patients should be monitored closely and the dose of anticoagulant altered accordingly, remembering that amiodarone levels take several weeks to stabilize.  []  Antiepileptics: Amiodarone can increase plasma concentration of phenytoin, the dose should be reduced. Note that small changes in phenytoin dose can result in large changes in levels. Monitor patient and counsel on signs of toxicity.  [x]  Beta blockers: increased risk of bradycardia, AV block and myocardial depression. Sotalol - avoid concomitant use.  []   Calcium channel blockers (diltiazem and verapamil): increased risk of bradycardia, AV block and myocardial depression.  []   Cyclosporine: Amiodarone increases levels of cyclosporine. Reduced dose of cyclosporine is recommended.  []  Digoxin dose should be halved when amiodarone is started.  []  Diuretics: increased risk of cardiotoxicity if hypokalemia occurs.  []  Oral hypoglycemic agents (glyburide, glipizide, glimepiride): increased risk of hypoglycemia. Patient's glucose levels should be monitored closely when initiating amiodarone therapy.   [x]  Drugs that prolong the QT interval:  Torsades de pointes risk may be increased with concurrent use - avoid if  possible.  Monitor QTc, also keep magnesium/potassium WNL if concurrent therapy can't be avoided. Marland Kitchen Antibiotics: e.g. fluoroquinolones, erythromycin. . Antiarrhythmics: e.g. quinidine, procainamide, disopyramide, sotalol. . Antipsychotics: e.g. phenothiazines, haloperidol.  . Lithium, tricyclic antidepressants, and methadone. - - - - -  Ranexa [ranolazine] may prolong the QT interval.  Caution when given with Amiodarone.    Thank You,  Laurena Bering  10/20/2012 10:27 PM

## 2012-10-20 NOTE — Progress Notes (Signed)
Pt noted to have converted from SR 60-70 to atrial fib with rate 80s.  Pt on 12.5 lopressor two times daily.  MD Tyrone Sage made aware.  Will continue to monitor.

## 2012-10-20 NOTE — Plan of Care (Signed)
Problem: Phase II Progression Outcomes Goal: Patient extubated within - Outcome: Completed/Met Date Met:  10/20/12 Extubated within 6 hrs

## 2012-10-20 NOTE — Progress Notes (Signed)
Patient ID: Ian Cummings., male   DOB: 10-07-1928, 79 y.o.   MRN: [id number] TCTS DAILY ICU PROGRESS NOTE                   [address]            [place] [address]          [phone]   2 Days Post-Op Procedure(s) (LRB): REDO CORONARY ARTERY BYPASS GRAFTING (CABG) (N/A) INTRAOPERATIVE TRANSESOPHAGEAL ECHOCARDIOGRAM (N/A)  Total Length of Stay:  LOS: 2 days   Subjective: Some cough and SOB last pm better now, holding sinus  Objective: Vital signs in last 24 hours: Temp:  [97.6 F (36.4 C)-98.2 F (36.8 C)] 98 F (36.7 C) (06/25 0734) Pulse Rate:  [58-84] 69 (06/25 1000) Cardiac Rhythm:  [-] Normal sinus rhythm (06/25 1000) Resp:  [14-22] 19 (06/25 1000) BP: (113-149)/(46-85) 113/53 mmHg (06/25 1000) SpO2:  [91 %-96 %] 94 % (06/25 1000) Weight:  [172 lb 9.9 oz (78.3 kg)] 172 lb 9.9 oz (78.3 kg) (06/25 0300)  Filed Weights   10/17/12 1300 10/19/12 0500 10/20/12 0300  Weight: 167 lb 3 oz (75.836 kg) 170 lb 10.2 oz (77.4 kg) 172 lb 9.9 oz (78.3 kg)    Weight change: 1 lb 15.7 oz (0.9 kg)   Hemodynamic parameters for last 24 hours:    Intake/Output from previous day: 06/24 0701 - 06/25 0700 In: 1350 [P.O.:900; I.V.:350; IV Piggyback:100] Out: 1600 [Urine:1550; Chest Tube:50]  Intake/Output this shift: Total I/O In: 540 [P.O.:540] Out: 245 [Urine:245]  Current Meds: Scheduled Meds: . acetaminophen  1,000 mg Oral Q6H   Or  . acetaminophen (TYLENOL) oral liquid 160 mg/5 mL  975 mg Per Tube Q6H  . allopurinol  300 mg Oral Daily  . aspirin EC  325 mg Oral Daily   Or  . aspirin  324 mg Per Tube Daily  . atorvastatin  80 mg Oral q1800  . bisacodyl  10 mg Oral Daily   Or  . bisacodyl  10 mg Rectal Daily  . Chlorhexidine Gluconate Cloth  6 each Topical Daily  . cholecalciferol  2,000 Units Oral Daily  . clopidogrel  75 mg Oral Q breakfast  . docusate sodium  200 mg Oral Daily  . enoxaparin  30 mg Subcutaneous Q24H  . insulin aspart  0-24  Units Subcutaneous Q4H  . insulin regular  0-10 Units Intravenous TID WC  . metoprolol tartrate  12.5 mg Oral BID   Or  . metoprolol tartrate  12.5 mg Per Tube BID  . multivitamin  1 tablet Oral Daily  . mupirocin ointment  1 application Nasal BID  . pantoprazole  40 mg Oral Daily  . PARoxetine  20 mg Oral BH-q7a  . ranolazine  500 mg Oral BID  . sodium chloride  3 mL Intravenous Q12H  . vitamin B-12  1,000 mcg Oral Daily   Continuous Infusions: . sodium chloride Stopped (10/19/12 1000)  . sodium chloride Stopped (10/20/12 0800)  . sodium chloride    . sodium chloride    . dexmedetomidine Stopped (10/18/12 1700)  . insulin (NOVOLIN-R) infusion Stopped (10/18/12 1800)  . lactated ringers Stopped (10/19/12 1000)  . nitroGLYCERIN Stopped (10/18/12 1300)  . phenylephrine (NEO-SYNEPHRINE) Adult infusion Stopped (10/18/12 2300)   PRN Meds:.metoprolol, midazolam, morphine injection, ondansetron (ZOFRAN) IV, oxyCODONE, sodium chloride  General appearance: alert, cooperative and no distress Neurologic: intact Heart: regular rate and rhythm, S1, S2 normal, no murmur, click, rub  or gallop Lungs: diminished breath sounds bibasilar Abdomen: soft, non-tender; bowel sounds normal; no masses,  no organomegaly Extremities: extremities normal, atraumatic, no cyanosis or edema and Homans sign is negative, no sign of DVT Wound: sterunm stable  Lab Results: CBC: Recent Labs  10/19/12 1600 10/19/12 1634 10/20/12 0330  WBC 10.0  --  11.7*  HGB 11.1* 10.9* 11.2*  HCT 32.0* 32.0* 32.4*  PLT 110*  --  127*   BMET:  Recent Labs  10/19/12 0500  10/19/12 1634 10/20/12 0330  NA 135  --  134* 131*  K 4.7  --  4.3 4.4  CL 102  --  98 96  CO2 27  --   --  27  GLUCOSE 121*  --  131* 118*  BUN 17  --  17 18  CREATININE 0.87  < > 1.00 0.94  CALCIUM 8.0*  --   --  8.3*  < > = values in this interval not displayed.  PT/INR:  Recent Labs  10/18/12 1313  LABPROT 14.8  INR 1.18    Radiology: Dg Chest [facility] 1 View  10/20/2012   *RADIOLOGY REPORT*  Clinical Data: Postop CABG.  PORTABLE CHEST - 1 VIEW  Comparison: 10/19/2012.  Findings: Trachea is midline.  Right IJ Swan-Ganz catheter has been removed with sheath remaining in place.  Right chest tube has been removed.  Cardiomediastinal silhouette appears grossly stable, given slight differences in lung expansion.  There are small bilateral pleural effusions with mild bibasilar atelectasis.  Biapical pleural thickening.  No definite pneumothorax.  No edema.  IMPRESSION:  1.  No definite pneumothorax after right chest tube removal. 2.  Small bilateral pleural effusions with mild bibasilar atelectasis.   Original Report Authenticated By: Leanna Battles, M.D.   Dg Chest Portable 1 View In Am  10/19/2012   *RADIOLOGY REPORT*  Clinical Data: Postop CABG.  PORTABLE CHEST - 1 VIEW  Comparison: 10/18/2012  Findings: Interval extubation and removal of NG tube.  Swan-Ganz catheter and right chest tube remain in place.  Small right apical pneumothorax cardiomegaly.  Bibasilar atelectasis and small effusions.  IMPRESSION: Interval extubation.  Bibasilar atelectasis and small effusions.  Small right apical pneumothorax.   Original Report Authenticated By: Charlett Nose, M.D.   Dg Chest Portable 1 View  10/18/2012   *RADIOLOGY REPORT*  Clinical Data: Postop CABG  PORTABLE CHEST - 1 VIEW  Comparison: 10/15/2012  Findings: There has been previous median sternotomy and CABG. Endotracheal tube has its tip 5 cm above the carina.  Nasogastric tube enters the stomach.  Swan-Ganz catheter has its tip the right main pulmonary artery.  Right-sided chest tube is in place.  No pneumothorax.  There is moderate atelectasis in the left lower lobe.  IMPRESSION: Lines and tubes well positioned.  No pneumothorax.  Moderate left lower lobe volume loss.   Original Report Authenticated By: Paulina Fusi, M.D.     Assessment/Plan: S/P Procedure(s) (LRB): REDO CORONARY  ARTERY BYPASS GRAFTING (CABG) (N/A) INTRAOPERATIVE TRANSESOPHAGEAL ECHOCARDIOGRAM (N/A) Mobilize Diuresis Plan for transfer to step-down: see transfer orders     Rona Tomson B 10/20/2012 10:10 AM

## 2012-10-21 LAB — TYPE AND SCREEN
ABO/RH(D): A POS
Antibody Screen: NEGATIVE
Unit division: 0
Unit division: 0

## 2012-10-21 LAB — CBC
HCT: 30.1 % — ABNORMAL LOW (ref 39.0–52.0)
Hemoglobin: 10.7 g/dL — ABNORMAL LOW (ref 13.0–17.0)
MCH: 32.1 pg (ref 26.0–34.0)
MCHC: 35.5 g/dL (ref 30.0–36.0)
MCV: 90.4 fL (ref 78.0–100.0)
Platelets: 111 10*3/uL — ABNORMAL LOW (ref 150–400)
RBC: 3.33 MIL/uL — ABNORMAL LOW (ref 4.22–5.81)
RDW: 14.2 % (ref 11.5–15.5)
WBC: 9.1 10*3/uL (ref 4.0–10.5)

## 2012-10-21 LAB — BASIC METABOLIC PANEL
BUN: 18 mg/dL (ref 6–23)
CO2: 26 mEq/L (ref 19–32)
Calcium: 8.7 mg/dL (ref 8.4–10.5)
Chloride: 91 mEq/L — ABNORMAL LOW (ref 96–112)
Creatinine, Ser: 0.81 mg/dL (ref 0.50–1.35)
GFR calc Af Amer: >90 mL/min (ref >90)
GFR calc non Af Amer: 81 mL/min — ABNORMAL LOW (ref >90)
Glucose, Bld: 125 mg/dL — ABNORMAL HIGH (ref 70–99)
Potassium: 4.4 mEq/L (ref 3.5–5.1)
Sodium: 126 mEq/L — ABNORMAL LOW (ref 135–145)

## 2012-10-21 LAB — GLUCOSE, CAPILLARY: Glucose-Capillary: 126 mg/dL — ABNORMAL HIGH (ref 70–99)

## 2012-10-21 LAB — MAGNESIUM: Magnesium: 1.9 mg/dL (ref 1.5–2.5)

## 2012-10-21 LAB — TSH: TSH: 5.915 u[IU]/mL — ABNORMAL HIGH (ref 0.350–4.500)

## 2012-10-21 MED ORDER — METOPROLOL TARTRATE 25 MG/10 ML ORAL SUSPENSION
12.5000 mg | Freq: Two times a day (BID) | ORAL | Status: DC
  Filled 2012-10-21 (×6): qty 5

## 2012-10-21 MED ORDER — FUROSEMIDE 40 MG PO TABS
40.0000 mg | ORAL_TABLET | Freq: Every day | ORAL | Status: DC
  Administered 2012-10-21 – 2012-10-24 (×4): 40 mg via ORAL
  Filled 2012-10-21 (×4): qty 1

## 2012-10-21 MED ORDER — METOPROLOL TARTRATE 12.5 MG HALF TABLET
12.5000 mg | ORAL_TABLET | Freq: Two times a day (BID) | ORAL | Status: DC
  Administered 2012-10-22 – 2012-10-24 (×5): 12.5 mg via ORAL
  Filled 2012-10-21 (×6): qty 1

## 2012-10-21 MED ORDER — AMIODARONE HCL 200 MG PO TABS: 200.0000 mg | ORAL_TABLET | Freq: Every day | ORAL | Status: DC

## 2012-10-21 MED ORDER — LACTULOSE 10 GM/15ML PO SOLN
20.0000 g | Freq: Once | ORAL | Status: AC
  Administered 2012-10-21: 20 g via ORAL
  Filled 2012-10-21: qty 30

## 2012-10-21 MED ORDER — AMIODARONE HCL 200 MG PO TABS
200.0000 mg | ORAL_TABLET | Freq: Two times a day (BID) | ORAL | Status: DC
  Administered 2012-10-21 – 2012-10-24 (×6): 200 mg via ORAL
  Filled 2012-10-21 (×7): qty 1

## 2012-10-21 MED ORDER — POTASSIUM CHLORIDE CRYS ER 20 MEQ PO TBCR
20.0000 meq | EXTENDED_RELEASE_TABLET | Freq: Every day | ORAL | Status: DC
  Administered 2012-10-21 – 2012-10-24 (×4): 20 meq via ORAL
  Filled 2012-10-21 (×4): qty 1

## 2012-10-21 MED FILL — Electrolyte-R (PH 7.4) Solution: INTRAVENOUS | Qty: 3000 | Status: AC

## 2012-10-21 MED FILL — Sodium Chloride Irrigation Soln 0.9%: Qty: 3000 | Status: AC

## 2012-10-21 MED FILL — Sodium Chloride IV Soln 0.9%: INTRAVENOUS | Qty: 1000 | Status: AC

## 2012-10-21 NOTE — Progress Notes (Signed)
CARDIAC REHAB PHASE I   PRE:  Rate/Rhythm: 67SR  BP:  Supine:   Sitting: 140/70  Standing:    SaO2: 95% 3.5 L  MODE:  Ambulation: 350 ft   POST:  Rate/Rhythm: 69 SR   BP:  Supine:   Sitting: 150/60  Standing:    SaO2: 94%3L 0900-0950 Pt with lots of equipment. Got NT to help with walk. Pt walked 350 ft on 3L with gait belt use and rolling walker and asst x 2. Pt with external pacer, oxygen and IV. Pt unsafe with turn but otherwise fairly steady. Put chair alarm in chair and assisted to recliner. Discussed with pt not to get up by himself. Turned oxygen to 2L since pt did well on 3L. Call bell in reach. Remained in NSR.   Luetta Nutting, RN BSN  10/21/2012 9:46 AM

## 2012-10-21 NOTE — Progress Notes (Signed)
Received report from day shift nurse that the pt went into A-Fib earlier before he transferred to 2000, MD made aware but did not order anything bc the pt was only in A-Fib for about an hour and   spontaneously converted to NSR. Tonight pt had little bursts of A-Fib and two pauses the first one was 3.8 the second was 2.7. EKG revealed A-Fib. MD on call made aware. MD ordered amiodarone drip without a bolus and to place pt on external pacer. Before  Pt was placed on drip and external pacer pt converted back into NSR. MD on call said to start drip. Pt was educated on everything that was going and stated that he had no questions. Will follow orders and continue to monitor.    Ian Cummings M

## 2012-10-21 NOTE — Progress Notes (Addendum)
      [address]       [place] [address]             [phone]        3 Days Post-Op Procedure(s) (LRB): REDO CORONARY ARTERY BYPASS GRAFTING (CABG) (N/A) INTRAOPERATIVE TRANSESOPHAGEAL ECHOCARDIOGRAM (N/A)  Subjective: Patient not feeling all that well. Felt a fib earlier  Objective: Vital signs in last 24 hours: Temp:  [97.3 F (36.3 C)-98.5 F (36.9 C)] 98.3 F (36.8 C) (06/26 0358) Pulse Rate:  [66-97] 70 (06/26 0358) Cardiac Rhythm:  [-] Ventricular paced (06/26 0517) Resp:  [16-20] 17 (06/26 0358) BP: (113-161)/(44-81) 155/64 mmHg (06/26 0358) SpO2:  [92 %-99 %] 99 % (06/26 0358) Weight:  [79.833 kg (176 lb)] 79.833 kg (176 lb) (06/26 0358)  Pre op weight 76 kg Current Weight  10/21/12 79.833 kg (176 lb)      Intake/Output from previous day: 06/25 0701 - 06/26 0700 In: 960 [P.O.:960] Out: 670 [Urine:670]   Physical Exam:  Cardiovascular: RRR Pulmonary: Diminished at bases; no rales, wheezes, or rhonchi. Abdomen: Soft, non tender, distended, bowel sounds present. Extremities: Mild bilateral lower extremity edema. Wounds: Proximal sternal wound with minor sero sanguinous ooze. No erythema. Lower extremity wound is clean and dry.  No erythema or signs of infection.  Lab Results: CBC: Recent Labs  10/20/12 0330 10/21/12 0420  WBC 11.7* 9.1  HGB 11.2* 10.7*  HCT 32.4* 30.1*  PLT 127* 111*   BMET:  Recent Labs  10/20/12 0330 10/21/12 0420  NA 131* 126*  K 4.4 4.4  CL 96 91*  CO2 27 26  GLUCOSE 118* 125*  BUN 18 18  CREATININE 0.94 0.81  CALCIUM 8.3* 8.7    PT/INR:  Lab Results  Component Value Date   INR 1.18 10/18/2012   INR 0.95 10/15/2012   INR 1.00 09/06/2012   ABG:  INR: Will add last result for INR, ABG once components are confirmed Will add last 4 CBG results once components are confirmed  Assessment/Plan:  1. CV - Went into a fib with RVR and had pauses earlier this am. Paced at 70 this am and is in  SR.Hypertensive. On Amiodarone gttp,Lopressor 12.5 bid and Plavix 75 daily. Will hold Lopressor this am 2.  Pulmonary - Encourage incentive spirometer and flutter valve 3. Volume Overload - Lasix 40 daily 4.  Acute blood loss anemia - H and H 10.7 and 30.1 5.Thrombocytopenia-platelets 111,000. 6.Hyponatremia-sodium 126. Possibly related to diuresis but is also on SSRI 7.LOC constipation 8.CBGs 132/108/126. Pre op HGA1C 5.1. Stop accu checks and SS PRN 9.Continue CRPI  ZIMMERMAN,DONIELLE MPA-C 10/21/2012,8:12 AM  Change to po Cordarone I have seen and examined Adolphus Birchwood. and agree with the above assessment  and plan.  Delight Ovens MD Beeper [phone] Office [phone] 10/21/2012 8:45 AM

## 2012-10-22 ENCOUNTER — Encounter (HOSPITAL_COMMUNITY): Payer: Self-pay | Admitting: Cardiothoracic Surgery

## 2012-10-22 DIAGNOSIS — R7989 Other specified abnormal findings of blood chemistry: Secondary | ICD-10-CM | POA: Diagnosis present

## 2012-10-22 LAB — BASIC METABOLIC PANEL
BUN: 18 mg/dL (ref 6–23)
CO2: 28 mEq/L (ref 19–32)
Calcium: 8.8 mg/dL (ref 8.4–10.5)
Chloride: 92 mEq/L — ABNORMAL LOW (ref 96–112)
Creatinine, Ser: 0.96 mg/dL (ref 0.50–1.35)
GFR calc Af Amer: 88 mL/min — ABNORMAL LOW (ref >90)
GFR calc non Af Amer: 76 mL/min — ABNORMAL LOW (ref >90)
Glucose, Bld: 112 mg/dL — ABNORMAL HIGH (ref 70–99)
Potassium: 4.1 mEq/L (ref 3.5–5.1)
Sodium: 127 mEq/L — ABNORMAL LOW (ref 135–145)

## 2012-10-22 MED ORDER — RAMIPRIL 2.5 MG PO CAPS
2.5000 mg | ORAL_CAPSULE | Freq: Two times a day (BID) | ORAL | Status: DC
  Administered 2012-10-22 – 2012-10-24 (×5): 2.5 mg via ORAL
  Filled 2012-10-22 (×6): qty 1

## 2012-10-22 MED ORDER — LEVOTHYROXINE SODIUM 25 MCG PO TABS
25.0000 ug | ORAL_TABLET | Freq: Every day | ORAL | Status: DC
  Administered 2012-10-23 – 2012-10-24 (×2): 25 ug via ORAL
  Filled 2012-10-22 (×3): qty 1

## 2012-10-22 NOTE — Discharge Summary (Signed)
Physician Discharge Summary       [address]       [place] [address]             [phone]    Patient ID: Ian Cummings. MRN: [id number] DOB/AGE: 1928-11-26 79 y.o.  Admit date: 10/18/2012 Discharge date: 10/24/2012  Admission Diagnoses: 1. History of CAD (s/p CABG x 7 95') 2.History of hyperlipidemia 3.History of hypertension 4.History of MI 5.History of tobacco abuse 6.History of TIA 7. History of right carotid artery disease (s/p stent 13') 8.History of BPH 9.History of GERD  Discharge Diagnoses:  1. History of CAD (s/p CABG x 7 95') 2.History of hyperlipidemia 3.History of hypertension 4.History of MI 5.History of tobacco abuse 6.History of TIA 7. History of right carotid artery disease (s/p stent 13') 8.History of BPH 9.History of GERD 10.Post op a fib (converted to SR) 11.ABL anemia 12.Mild thrombocytopenia 13.TSH 5.915  Procedure (s):  Off-pump redo coronary artery bypass grafting x1 with  reverse saphenous vein graft to the right coronary system with left  thigh endovein harvesting by Dr. Tyrone Sage on 10/18/2012.  History of Presenting Illness: This is a 79 y.o. male with known history of CAD. He is s/p CABG performed by Dr. Tyrone Sage  in 1995. At that time, he had coronary artery bypass grafting x7 with the left internal mammary to the left anterior descending coronary artery reverse saphenous vein graft to the second diagonal sequential reverse saphenous vein graft to the first diagonal and distal circumflex triple reverse saphenous vein graft to the posterior descending posterior lateral branch one and posterior lateral branch 2 of the right coronary artery. Since that time, the patient has developed further disease progression involving his saphenous vein grafts and native coronary arteries. The patient states that he has had 10 stents placed to his RCA, RCA graft and Left Circumflex graft. Despite these interventions, the patient has  continued to experience angina. He states these episodes have become more frequent but do improve with use of Nitroglycerin. Approximately 3 weeks ago, he presented to the Emergency Department at [facility] with chest pain and was found to have a NSTEMI. Subsequent catheterization showed a patent LIMA to LAD, however his native coronaries and saphenous vein grafts to the diagonal and right coronary grafts, the graft to the distal circumflex was opened. It was felt no intervention could be offered at that time and the patient was treated with medical management. Patient states initially his angina symptoms improved. After his catheterization at [facility], he traveled to [place] to visit with family and developed recurrent angina with increasing frequency. He took NTG, which did not provide any relief. Once the patient arrived back to [facility], he presented to [facility] Emergency Department for further evaluation. Cardiac enzymes were negative. EKG showed some mild ST depression in the anterior leads. He was felt to have unstable angina and was admitted and placed on a Heparin drip and Lovenox therapy. At that time, the films from [facility] were reviewed with total occlusion of the old heavily stented vein graft to the right, with poor distal flow. The question of distal disease in suitable targets was not resolved by the films. Since seen at the hospital, the patient's symptoms have improved to some degree. He has had some occasional angina. He was able to do yard work without episodes of angina at the end of last week.  He had undergone a cardiac catheterization last summer while in [place], [place].The cath films  were reviewed from [place]. At that time, the vein graft to the right system was opened with an ostial stenosis, there was extensive filling of the right system with adequate targets for repeat bypass surgery in the right system. Dr. Tyrone Sage discussed the need for redo median  sternotomy for CABG. Potential risks, complications, and benefits were discussed. The patient agree to proceed with surgery. He underwent a redo sternotomy for CABG x 1 on 10/18/2012.  Brief Hospital Course:  The patient was extubated the evening of surgery without difficulty. He remained afebrile and hemodynamically stable. Theone Murdoch, a line, chest tubes, and foley were removed early in the post operative course. Lopressor was started and titrated accordingly. He was restarted on Plavix. He was volume over loaded and diuresed. He was weaned off the insulin drip. The patient's HGA1C pre op was 5.1 . He did have ABL anemia. He did not require a post operative transfusion.His last H and H was 10.7 and 30.1.He also had thrombocytopenia. His last platelet count was 111,000.He went into a fib the morning of post operative day three. He was put on an Amiodarone gttp. He converted to sinus rhythm. He was placed on oral Amiodarone.The patient was felt surgically stable for transfer from the ICU to [facility] for further convalescence on 10/20/2012. He continues to progress with cardiac rehab. He has been weaned from supplemental oxygen and sats have been greater than 90%. He has been tolerating a diet and has had a bowel movement. Epicardial pacing wires and chest tube sutures will be removed prior to discharge. Blood pressures are trending up, and he has been restarted on his home doses of Norvasc and Lisinopril.   He is surgically stable for discharge on 10/24/2012.    Latest Vital Signs: Blood pressure 140/86, pulse 88, temperature 97.6 F (36.4 C), temperature source Oral, resp. rate 20, height 5\' 11"  (1.803 m), weight 78.6 kg (173 lb 4.5 oz), SpO2 96.00%.  Physical Exam: Cardiovascular: RRR  Pulmonary: Diminished at bases; no rales, wheezes, or rhonchi.  Abdomen: Soft, non tender, distended, bowel sounds present.  Extremities: Mild bilateral lower extremity edema.  Wounds: Proximal sternal wound no longer with  minor sero sanguinous ooze. No erythema. Lower extremity wound is clean and dry. No erythema or signs of infection.   Discharge Condition:Stable  Recent laboratory studies:  Lab Results  Component Value Date   WBC 9.1 10/21/2012   HGB 10.7* 10/21/2012   HCT 30.1* 10/21/2012   MCV 90.4 10/21/2012   PLT 111* 10/21/2012   Lab Results  Component Value Date   NA 127* 10/22/2012   K 4.1 10/22/2012   CL 92* 10/22/2012   CO2 28 10/22/2012   CREATININE 0.96 10/22/2012   GLUCOSE 112* 10/22/2012      Diagnostic Studies:  Dg Chest Port 1 View  10/20/2012   *RADIOLOGY REPORT*  Clinical Data: Postop CABG.  PORTABLE CHEST - 1 VIEW  Comparison: 10/19/2012.  Findings: Trachea is midline.  Right IJ Swan-Ganz catheter has been removed with sheath remaining in place.  Right chest tube has been removed.  Cardiomediastinal silhouette appears grossly stable, given slight differences in lung expansion.  There are small bilateral pleural effusions with mild bibasilar atelectasis.  Biapical pleural thickening.  No definite pneumothorax.  No edema.  IMPRESSION:  1.  No definite pneumothorax after right chest tube removal. 2.  Small bilateral pleural effusions with mild bibasilar atelectasis.   Original Report Authenticated By: Leanna Battles, M.D.    Discharge Medications:  Medication List    STOP taking these medications       aspirin 81 MG tablet  Replaced by:  aspirin 325 MG EC tablet     atenolol 50 MG tablet  Commonly known as:  TENORMIN     hydrochlorothiazide 25 MG tablet  Commonly known as:  HYDRODIURIL     isosorbide mononitrate 60 MG 24 hr tablet  Commonly known as:  IMDUR      TAKE these medications       allopurinol 300 MG tablet  Commonly known as:  ZYLOPRIM  Take 300 mg by mouth daily.     amiodarone 200 MG tablet  Commonly known as:  PACERONE  Take 1 tablet (200 mg total) by mouth every 12 (twelve) hours.     amLODipine 10 MG tablet  Commonly known as:  NORVASC  Take 10 mg by  mouth daily.     aspirin 325 MG EC tablet  Take 1 tablet (325 mg total) by mouth daily.     cholecalciferol 1000 UNITS tablet  Commonly known as:  VITAMIN D  Take 2,000 Units by mouth daily.     clopidogrel 75 MG tablet  Commonly known as:  PLAVIX  Take 75 mg by mouth daily.     fish oil-omega-3 fatty acids 1000 MG capsule  Take 1 g by mouth daily.     furosemide 40 MG tablet  Commonly known as:  LASIX  Take 1 tablet (40 mg total) by mouth daily. X 1 week     lansoprazole 15 MG capsule  Commonly known as:  PREVACID  Take 15 mg by mouth daily.     levothyroxine 25 MCG tablet  Commonly known as:  SYNTHROID, LEVOTHROID  Take 1 tablet (25 mcg total) by mouth daily before breakfast.     metoprolol tartrate 12.5 mg Tabs  Commonly known as:  LOPRESSOR  Take 0.5 tablets (12.5 mg total) by mouth 2 (two) times daily.     multivitamin Tabs tablet  Take 1 tablet by mouth daily.     nitroGLYCERIN 0.4 MG SL tablet  Commonly known as:  NITROSTAT  Place 0.4 mg under the tongue every 5 (five) minutes as needed.     oxyCODONE 5 MG immediate release tablet  Commonly known as:  Oxy IR/ROXICODONE  Take 1-2 tablets (5-10 mg total) by mouth every 3 (three) hours as needed for pain.     PARoxetine 20 MG tablet  Commonly known as:  PAXIL  Take 20 mg by mouth every morning.     potassium chloride SA 20 MEQ tablet  Commonly known as:  K-DUR,KLOR-CON  Take 1 tablet (20 mEq total) by mouth daily. X 1 week     ramipril 2.5 MG tablet  Commonly known as:  ALTACE  Take 2.5 mg by mouth 2 (two) times daily.     ranolazine 500 MG 12 hr tablet  Commonly known as:  RANEXA  Take 500 mg by mouth 2 (two) times daily.     rosuvastatin 40 MG tablet  Commonly known as:  CRESTOR  Take 40 mg by mouth daily.     vitamin B-12 1000 MCG tablet  Commonly known as:  CYANOCOBALAMIN  Take 1,000 mcg by mouth daily.          1.Beta Blocker:  Yes [ x  ]  No   [   ]                               If No, reason:  2.Ace Inhibitor/ARB: Yes [  x ]                                     No  [    ]                                     If No, reason:  3.Statin:   Yes [  x ]                  No  [   ]                  If No, reason:  4.Ecasa:  Yes  [ x  ]                  No   [   ]                  If No, reason:    Follow Up Appointments:  Follow-up Information   Follow up with TILLEY JR,W SPENCER, MD. (Call for a follow up appointment for 2 weeks)    Contact information:   [address] [place] [address] [phone]       Follow up with Sheliah Plane B, MD. (PA/LAT CXR to be taken (at [facility] which is in the same building as Dr. Dennie Maizes office) on 11/18/2012 at 3:00 pm;Appointment with Dr. Tyrone Sage is on 11/18/2012 at 4:00 pm )    Contact information:   [address] [employer] [address] [place] [address] [phone]       Please follow up. (See your primary care MD in 1-2 weeks to follow up on thyroid function tests)       Signed: ZIMMERMAN,DONIELLE MPA-C 10/22/2012, 9:25 AM

## 2012-10-22 NOTE — Care Management Note (Unsigned)
    Page 1 of 1   10/22/2012     3:31:10 PM   CARE MANAGEMENT NOTE 10/22/2012  Patient:  Ian Cummings, Ian Cummings   Account Number:  [id number]  Date Initiated:  10/22/2012  Documentation initiated by:  Yona Stansbury  Subjective/Objective Assessment:   PT S/P REDO CABG X1 ON 10/18/12.  PTA, PT RESIDES AT HOME WITH SPOUSE.     Action/Plan:   WIFE TO PROVIDE 24HR CARE AT DC.  WILL FOLLOW FOR HOME NEEDS.   Anticipated DC Date:  10/24/2012   Anticipated DC Plan:  HOME/SELF CARE      DC Planning Services  CM consult      Choice offered to / List presented to:             Status of service:  In process, will continue to follow Medicare Important Message given?   (If response is "NO", the following Medicare IM given date fields will be blank) Date Medicare IM given:   Date Additional Medicare IM given:    Discharge Disposition:    Per UR Regulation:  Reviewed for med. necessity/level of care/duration of stay  If discussed at Long Length of Stay Meetings, dates discussed:    Comments:

## 2012-10-22 NOTE — Progress Notes (Signed)
CARDIAC REHAB PHASE I   PRE:  Rate/Rhythm: 77SR  BP:  Supine:   Sitting: 129/66  Standing:    SaO2: 88-92%RA  MODE:  Ambulation: 550 ft   POST:  Rate/Rhythm: 102 ST  BP:  Supine:   Sitting: 141/63  Standing:    SaO2: monitored whole walk 88-92%RA  [id number] Pt walked 550 ft on RA with rolling walker and asst x 2. Needed one asst to monitor sats whole walk. Pt would dip to 88%RA when walking but quickly come up to 91-92% with rest. No c/o SOB, tolerated well. To recliner after walk. Wife in room. Can be asst x 1. Discussed CRP 2 and permission given to refer to Enis Slipper, RN BSN  10/22/2012 10:42 AM   77SR

## 2012-10-22 NOTE — Progress Notes (Signed)
Pt ambulated 150 ft with the assistance of a walker and on 3L of oxygen. Pt tolerated activity fairly well and at a slow pace. HR did reach the 120s. Pt is now resting in chair using his incentive spirometer. Pt verbalized that he feels much better after the walk. Will continue to monitor.  Jernard Reiber M

## 2012-10-22 NOTE — Progress Notes (Signed)
Assisted pt to ambulate 650 ft with rolling walker.  Pt tolerated well. Remained NSR in the 70s through the duration of the walk.  Pt back to room in chair.  Call light at bedside. Zerenity Bowron, Avie Echevaria , RN

## 2012-10-22 NOTE — Progress Notes (Addendum)
      [address]       [place] [address]             [phone]        4 Days Post-Op Procedure(s) (LRB): REDO CORONARY ARTERY BYPASS GRAFTING (CABG) (N/A) INTRAOPERATIVE TRANSESOPHAGEAL ECHOCARDIOGRAM (N/A)  Subjective: Patient had a bowel movement. He is without complaints this am  Objective: Vital signs in last 24 hours: Temp:  [97.6 F (36.4 C)-98.4 F (36.9 C)] 97.6 F (36.4 C) (06/27 0427) Pulse Rate:  [70-88] 88 (06/27 0427) Cardiac Rhythm:  [-] Atrial fibrillation (06/27 0718) Resp:  [17-20] 20 (06/27 0427) BP: (129-157)/(44-86) 140/86 mmHg (06/27 0427) SpO2:  [96 %-99 %] 96 % (06/27 0427) Weight:  [78.6 kg (173 lb 4.5 oz)] 78.6 kg (173 lb 4.5 oz) (06/27 0427)  Pre op weight 76 kg Current Weight  10/22/12 78.6 kg (173 lb 4.5 oz)      Intake/Output from previous day: 06/26 0701 - 06/27 0700 In: 720 [P.O.:720] Out: 950 [Urine:950]   Physical Exam:  Cardiovascular: RRR Pulmonary: Diminished at bases; no rales, wheezes, or rhonchi. Abdomen: Soft, non tender, distended, bowel sounds present. Extremities: Mild bilateral lower extremity edema. Wounds: Proximal sternal wound no longer with minor sero sanguinous ooze. No erythema. Lower extremity wound is clean and dry.  No erythema or signs of infection.  Lab Results: CBC:  Recent Labs  10/20/12 0330 10/21/12 0420  WBC 11.7* 9.1  HGB 11.2* 10.7*  HCT 32.4* 30.1*  PLT 127* 111*   BMET:   Recent Labs  10/21/12 0420 10/22/12 0650  NA 126* 127*  K 4.4 4.1  CL 91* 92*  CO2 26 28  GLUCOSE 125* 112*  BUN 18 18  CREATININE 0.81 0.96  CALCIUM 8.7 8.8    PT/INR:  Lab Results  Component Value Date   INR 1.18 10/18/2012   INR 0.95 10/15/2012   INR 1.00 09/06/2012   ABG:  INR: Will add last result for INR, ABG once components are confirmed Will add last 4 CBG results once components are confirmed  Assessment/Plan:  1. CV - Went into a fib with RVR and had pauses. SR/PVCS  this am.Hypertensive. On Amiodarone 200 daily,Lopressor 12.5 bid and Plavix 75 daily.  Will restart Ramipril for better bp control. 2.  Pulmonary - On 2 liters of oxygen via Stanton-wean as tolerates.Encourage incentive spirometer and flutter valve 3. Volume Overload - Lasix 40 daily 4.  Acute blood loss anemia - H and H 10.7 and 30.1 5.Thrombocytopenia-platelets 111,000. 6.Hyponatremia-sodium 127. Possibly related to diuresis but is also on SSRI 7.TSH 5.915. Will discuss with Dr. Tyrone Sage 8.Continue CRPI  ZIMMERMAN,DONIELLE MPA-C 10/22/2012,8:07 AM   TSH elevated. starty low dose thyroid replacement will need medical follow for this as outpatient I have seen and examined Adolphus Birchwood. and agree with the above assessment  and plan.  Delight Ovens MD Beeper [phone] Office [phone] 10/22/2012 11:27 AM

## 2012-10-23 LAB — BASIC METABOLIC PANEL
BUN: 22 mg/dL (ref 6–23)
CO2: 29 mEq/L (ref 19–32)
Calcium: 8.8 mg/dL (ref 8.4–10.5)
Chloride: 97 mEq/L (ref 96–112)
Creatinine, Ser: 0.93 mg/dL (ref 0.50–1.35)
GFR calc Af Amer: 89 mL/min — ABNORMAL LOW (ref >90)
GFR calc non Af Amer: 77 mL/min — ABNORMAL LOW (ref >90)
Glucose, Bld: 103 mg/dL — ABNORMAL HIGH (ref 70–99)
Potassium: 3.7 mEq/L (ref 3.5–5.1)
Sodium: 134 mEq/L — ABNORMAL LOW (ref 135–145)

## 2012-10-23 NOTE — Progress Notes (Addendum)
       [address]       [place] [address]             [phone]          5 Days Post-Op Procedure(s) (LRB): REDO CORONARY ARTERY BYPASS GRAFTING (CABG) (N/A) INTRAOPERATIVE TRANSESOPHAGEAL ECHOCARDIOGRAM (N/A)  Subjective: C/o productive cough, yellowish sputum this am.  Sore from cough.   Objective: Vital signs in last 24 hours: Patient Vitals for the past 24 hrs:  BP Temp Temp src Pulse Resp SpO2 Weight  10/23/12 0500 - - - - - - 166 lb 9.6 oz (75.569 kg)  10/23/12 0409 129/57 mmHg 98.2 F (36.8 C) Oral 66 20 93 % -  10/22/12 2025 159/73 mmHg 97.9 F (36.6 C) Oral 68 20 96 % -  10/22/12 1343 114/44 mmHg 97.8 F (36.6 C) Oral 66 - 93 % -   Current Weight  10/23/12 166 lb 9.6 oz (75.569 kg)  Pre op weight 76 kg    Intake/Output from previous day: 06/27 0701 - 06/28 0700 In: 720 [P.O.:720] Out: 1400 [Urine:1400]    PHYSICAL EXAM:  Heart: RRR Lungs: Clear Wound: Clean and dry Extremities: Mild LLE edema   Lab Results: CBC: Recent Labs  10/21/12 0420  WBC 9.1  HGB 10.7*  HCT 30.1*  PLT 111*   BMET:  Recent Labs  10/22/12 0650 10/23/12 0412  NA 127* 134*  K 4.1 3.7  CL 92* 97  CO2 28 29  GLUCOSE 112* 103*  BUN 18 22  CREATININE 0.96 0.93  CALCIUM 8.8 8.8    PT/INR: No results found for this basename: LABPROT, INR,  in the last 72 hours    Assessment/Plan: S/P Procedure(s) (LRB): REDO CORONARY ARTERY BYPASS GRAFTING (CABG) (N/A) INTRAOPERATIVE TRANSESOPHAGEAL ECHOCARDIOGRAM (N/A)  CV- rhythm stable, no further bradycardia or pauses, not pacing.  Will roll and tape EPWs.  BPs mildly elevated.  Continue Lopressor, Amio, ramipril.  Vol overload- diurese.  Pulm- continue IS/pulm toilet.  Watch cough.  Hyponatremia- resolved.  Hypothyroidism- Synthroid started.  Continue CRPI, pulm toilet.  Home later today vs in am.   LOS: 5 days    COLLINS,GINA H 10/23/2012  Will d/c pacer today, home in am if rhythm  stable I have seen and examined Adolphus Birchwood. and agree with the above assessment  and plan.  Delight Ovens MD Beeper [phone] Office [phone] 10/23/2012 10:32 AM

## 2012-10-23 NOTE — Progress Notes (Signed)
   CARE MANAGEMENT NOTE 10/23/2012  Patient:  Ian Cummings, Ian Cummings   Account Number:  [id number]  Date Initiated:  10/22/2012  Documentation initiated by:  AMERSON,JULIE  Subjective/Objective Assessment:   PT S/P REDO CABG X1 ON 10/18/12.  PTA, PT RESIDES AT HOME WITH SPOUSE.     Action/Plan:   WIFE TO PROVIDE 24HR CARE AT DC.  WILL FOLLOW FOR HOME NEEDS.   Anticipated DC Date:  10/24/2012   Anticipated DC Plan:  HOME/SELF CARE      DC Planning Services  CM consult      [facility] Choice  HOME HEALTH   Choice offered to / List presented to:  C-3 Spouse           Status of service:  In process, will continue to follow Medicare Important Message given?   (If response is "NO", the following Medicare IM given date fields will be blank) Date Medicare IM given:   Date Additional Medicare IM given:    Discharge Disposition:    Per UR Regulation:  Reviewed for med. necessity/level of care/duration of stay  If discussed at Long Length of Stay Meetings, dates discussed:    Comments:  10/23/2012 1500 NCM spoke to pt and gave permission to speak to wife. Wife is requesting a HH RN to follow up post dc. States she is wanting [facility] for [facility] if ordered by MD. NCM will continue to follow up for dc needs. Isidoro Donning RN CCM Case Mgmt phone [phone]

## 2012-10-23 NOTE — Progress Notes (Signed)
EPWs removed per MD order and protocol. Wire ends intact, Pt tolerated procedure well. Vital signs stable. Pt on bedrest X 1 hour, call bell within reach. Will continue to monitor.

## 2012-10-23 NOTE — Progress Notes (Signed)
CARDIAC REHAB PHASE I   PRE:  Rate/Rhythm: 71 SR    BP: sitting 130/70    SaO2: 99 RA  MODE:  Ambulation: 550 ft   POST:  Rate/Rhythm: 66    BP: sitting 136/60     SaO2: 96 RA  Tolerated well with RW. Has one at home that he can use. Did not want to increase distance. Ed completed with pt. Requests referral to be sent to [facility]. [id number]   Harriet Masson CES, ACSM 10/23/2012 3:11 PM

## 2012-10-23 NOTE — Progress Notes (Signed)
Pt ambulated in hallway 350 ft with rolling walker on room air. Pt tolerated activity well. Will continue to monitor.  

## 2012-10-24 LAB — BASIC METABOLIC PANEL
BUN: 17 mg/dL (ref 6–23)
CO2: 27 mEq/L (ref 19–32)
Calcium: 8.7 mg/dL (ref 8.4–10.5)
Chloride: 98 mEq/L (ref 96–112)
Creatinine, Ser: 0.91 mg/dL (ref 0.50–1.35)
GFR calc Af Amer: 90 mL/min — ABNORMAL LOW (ref >90)
GFR calc non Af Amer: 77 mL/min — ABNORMAL LOW (ref >90)
Glucose, Bld: 136 mg/dL — ABNORMAL HIGH (ref 70–99)
Potassium: 3.3 mEq/L — ABNORMAL LOW (ref 3.5–5.1)
Sodium: 134 mEq/L — ABNORMAL LOW (ref 135–145)

## 2012-10-24 MED ORDER — LEVOTHYROXINE SODIUM 25 MCG PO TABS: 25.0000 ug | ORAL_TABLET | Freq: Every day | ORAL | Status: DC

## 2012-10-24 MED ORDER — METOPROLOL TARTRATE 12.5 MG HALF TABLET: 12.5000 mg | ORAL_TABLET | Freq: Two times a day (BID) | ORAL | Status: DC

## 2012-10-24 MED ORDER — FUROSEMIDE 40 MG PO TABS: 40.0000 mg | ORAL_TABLET | Freq: Every day | ORAL | Status: DC

## 2012-10-24 MED ORDER — AMIODARONE HCL 200 MG PO TABS: 200.0000 mg | ORAL_TABLET | Freq: Two times a day (BID) | ORAL | Status: DC

## 2012-10-24 MED ORDER — POTASSIUM CHLORIDE CRYS ER 20 MEQ PO TBCR: 20.0000 meq | EXTENDED_RELEASE_TABLET | Freq: Every day | ORAL | Status: DC

## 2012-10-24 MED ORDER — ASPIRIN 325 MG PO TBEC: 325.0000 mg | DELAYED_RELEASE_TABLET | Freq: Every day | ORAL | Status: DC

## 2012-10-24 MED ORDER — OXYCODONE HCL 5 MG PO TABS: 5.0000 mg | ORAL_TABLET | ORAL | Status: DC | PRN

## 2012-10-24 NOTE — Progress Notes (Signed)
CT suture removed per order. Steri strips applied. Pt tolerated procedure well. Will continue to monitor pt closely.

## 2012-10-24 NOTE — Progress Notes (Signed)
Assessment unchanged. Discussed D/C instructions with pt and wife including f/u appointments and RX. Verbalized understanding. RX and appointment card given to pt. IV and tele removed. Pt left via W/C accompanied by NT.

## 2012-10-24 NOTE — Progress Notes (Addendum)
       [address]       [place] [address]             [phone]          6 Days Post-Op Procedure(s) (LRB): REDO CORONARY ARTERY BYPASS GRAFTING (CABG) (N/A) INTRAOPERATIVE TRANSESOPHAGEAL ECHOCARDIOGRAM (N/A)  Subjective: Sore from coughing, otherwise doing well.  Coughing up clear sputum.   Objective: Vital signs in last 24 hours: Patient Vitals for the past 24 hrs:  BP Temp Temp src Pulse Resp SpO2 Weight  10/24/12 0516 111/47 mmHg 98.2 F (36.8 C) Oral 81 18 97 % 165 lb 9.1 oz (75.1 kg)  10/23/12 2149 147/58 mmHg 98.2 F (36.8 C) Oral 68 18 96 % -  10/23/12 2100 130/50 mmHg - - - - - -  10/23/12 1500 135/50 mmHg 97.9 F (36.6 C) Oral 59 20 97 % -  10/23/12 1230 125/46 mmHg - - 62 - 96 % -  10/23/12 1215 147/48 mmHg - - 63 - 95 % -  10/23/12 1201 135/43 mmHg - - 64 - 96 % -  10/23/12 1145 118/47 mmHg - - 65 - 95 % -  10/23/12 1131 128/46 mmHg - - 64 - 94 % -  10/23/12 1120 134/47 mmHg - - - - 93 % -  10/23/12 1004 - - - 67 - - -  10/23/12 1003 135/48 mmHg - - - - - -   Current Weight  10/24/12 165 lb 9.1 oz (75.1 kg)     Intake/Output from previous day: 06/28 0701 - 06/29 0700 In: -  Out: 1500 [Urine:1500]    PHYSICAL EXAM:  Heart: RRR Lungs: Clear Wound: Clean and dry Extremities: Mild LE edema   Lab Results: CBC:No results found for this basename: WBC, HGB, HCT, PLT,  in the last 72 hours BMET:  Recent Labs  10/23/12 0412 10/24/12 0526  NA 134* 134*  K 3.7 3.3*  CL 97 98  CO2 29 27  GLUCOSE 103* 136*  BUN 22 17  CREATININE 0.93 0.91  CALCIUM 8.8 8.7    PT/INR: No results found for this basename: LABPROT, INR,  in the last 72 hours    Assessment/Plan: S/P Procedure(s) (LRB): REDO CORONARY ARTERY BYPASS GRAFTING (CABG) (N/A) INTRAOPERATIVE TRANSESOPHAGEAL ECHOCARDIOGRAM (N/A) CV- rhythm stable. BPs trending up.  Continue home ACE-i, low dose beta blocker, resume home Norvasc. Vol overload- diurese. Pulm-  continue IS/pulmtoilet. Home today- instructions reviewed with pt.   LOS: 6 days    COLLINS,GINA H 10/24/2012  Home today I have seen and examined Ian Cummings. and agree with the above assessment  and plan.  Delight Ovens MD [facility] [phone] Office [phone] 10/24/2012 10:24 AM

## 2012-10-24 NOTE — Progress Notes (Signed)
   CARE MANAGEMENT NOTE 10/24/2012  Patient:  Ian Cummings, Ian Cummings   Account Number:  [id number]  Date Initiated:  10/22/2012  Documentation initiated by:  AMERSON,JULIE  Subjective/Objective Assessment:   PT S/P REDO CABG X1 ON 10/18/12.  PTA, PT RESIDES AT HOME WITH SPOUSE.     Action/Plan:   WIFE TO PROVIDE 24HR CARE AT DC.  WILL FOLLOW FOR HOME NEEDS.   Anticipated DC Date:  10/24/2012   Anticipated DC Plan:  HOME/SELF CARE      DC Planning Services  CM consult      [facility] Choice  HOME HEALTH   Choice offered to / List presented to:  C-3 Spouse        HH arranged  HH-1 RN      [facility] agency  [facility]   Status of service:  Completed, signed off Medicare Important Message given?   (If response is "NO", the following Medicare IM given date fields will be blank) Date Medicare IM given:   Date Additional Medicare IM given:    Discharge Disposition:  HOME W HOME HEALTH SERVICES  Per UR Regulation:  Reviewed for med. necessity/level of care/duration of stay  If discussed at Long Length of Stay Meetings, dates discussed:    Comments:  10/24/2012 1030 NCM faxed referral to [facility] for soc 24-48 hours. Spoke to wife and no DME needed. [facility] contact info added to dc instructions. Isidoro Donning RN CCM Case Mgmt phone [phone]  10/23/2012 1500 NCM spoke to pt and gave permission to speak to wife. Wife is requesting a HH RN to follow up post dc. States she is wanting [facility] for [facility] if ordered by MD. NCM will continue to follow up for dc needs. Isidoro Donning RN CCM Case Mgmt phone [phone]

## 2012-11-08 ENCOUNTER — Encounter: Payer: Self-pay | Admitting: Cardiology

## 2012-11-08 NOTE — Progress Notes (Signed)
Patient ID: Ian Cummings., male   DOB: [birth year], 79 y.o.   MRN: [id number]   Saben, Donigan  Date of visit:  11/08/2012 DOB:  March 18, 1929    Age:  79 yrs. Medical record number:  [id number]     Account number:  [id number] Primary Care Provider: Buren Kos ____________________________ CURRENT DIAGNOSES  1. CAD. Bypass graft  2. CAD,Native  3. Hyperlipidemia  4. Carotid Artery Stenosis  5. Peripheral Vascular Disease  6. Surgery-Aortocoronary Bypass Grafting ____________________________ ALLERGIES  No Known Drug Allergies ____________________________ MEDICATIONS  1. allopurinol 300 mg tablet, 1 p.o. daily  2. multivitamin tablet, 1 p.o. daily  3. Vitamin D3 1,000 unit tablet, 1 p.o. daily  4. Crestor 40 mg tablet, 1 p.o. daily  5. clopidogrel 75 mg tablet, 1 p.o. daily  6. lansoprazole 15 mg tablet,disintegrat, delay rel, 1 p.o. daily  7. Fish Oil 340-1,000 mg capsule, 1 p.o. daily  8. levothyroxine 25 mcg tablet, 1 p.o. daily  9. paroxetine HCl 20 mg tablet, 1 p.o. daily  10. Vitamin B-12 1,000 mcg tablet, 1 p.o. daily  11. aspirin 81 mg tablet,chewable, 1 p.o. daily  12. metoprolol tartrate 25 mg tablet, 1/2 tab b.i.d.  13. herbal drugs tablet, placebo or Evacetrapib  1 qd        DRUG STUDY  14. amiodarone 200 mg tablet, 1 p.o. daily  15. ramipril 2.5 mg capsule, 1 p.o. daily  16. amlodipine 2.5 mg tablet, 1 p.o. daily ____________________________ CHIEF COMPLAINTS  Followup of CAD,Native ____________________________ HISTORY OF PRESENT ILLNESS  Patient seen for cardiac followup. It has been about 3 weeks since his surgery and he has not seen Dr. Tyrone Sage yet. He has some difficulty sleeping at night and is mainly limited with claudication. He was in no a vascular surgeon. His blood pressure remains low. He has had no significant angina and feels as if his appetite is starting to slowly improve. ____________________________ PAST HISTORY  Past Medical Illnesses:   hyperlipidemia, claudication-BLE, BPH, hypertension, gout, GERD;  Cardiovascular Illnesses:  CAD, peripheral vascular disease, Carotid artery disease;  Surgical Procedures:  CABG, carotid stent, inguinal herniorrhaphy-rt, inguinal herniorrhaphy-left, lt foot surgery, cataract extraction OU, tonsillectomy;  Cardiology Procedures-Invasive:  CABG w LIMA to LAD, SVG to OM1-OM2, SVG to dx, SVG to PD/PL 1995, Cypher stent x 2 SVG to RCA and Cypher stent to Circ Graft 05/13/07, stent of SVG to circ OM in  [place] in July 2013, redo CABG off pump Dr. Tyrone Sage 10/18/12;  Cardiology Procedures-Noninvasive:  echocardiogram, treadmill cardiolite;  Cardiac Cath Results:  normal Left main, occluded mid LAD, occluded RCA SVG, occluded CFX, widely patent OM 1 SVG, occluded RCA SVG, occluded Diag 1 SVG, widely patent LAD LIMA graft;  Peripheral Vascular Procedures:  carotid stent right 2013 [facility];  LVEF not documented,   ____________________________ Jacqulyn Bath TEST DATES EKG Date:  10/26/2012;   Cardiac Cath Date:  08/09/2012;  CABG: 08/07/1993;  Stent Placement Date: 11/02/2011;  Chest Xray Date: 09/06/2012;   ____________________________ FAMILY HISTORY Brother -- Brother alive and well Father -- Myocardial infarction, Deceased Mother -- Unknown Disease, Deceased Sister -- CVA, Deceased Sister -- Hypertension ____________________________ SOCIAL HISTORY Alcohol Use:  wine 1-2 per day;  Smoking:  used to smoke but quit Prior to 1980;  Diet:  low sugar;  Lifestyle:  married;  Exercise:  some exercise;  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  Integumentary:no rashes or new skin lesions. Respiratory: denies dyspnea, cough, wheezing or hemoptysis. Cardiovascular:  please review HPI Abdominal: denies dyspepsia, GI bleeding, constipation, or diarrhea  Genitourinary-Male: nocturia, erectile dysfunction  Musculoskeletal:  arthritis of the hands Neurological:  denies headaches, stroke, or TIA  ____________________________ PHYSICAL EXAMINATION VITAL SIGNS  Blood Pressure:  114/70 Sitting, Right arm, regular cuff   Pulse:  64/min. Weight:  159.00 lbs. Height:  71"BMI: 22  Constitutional:  pleasant white male in no acute distress Skin:  warm and dry to touch, no apparent skin lesions, or masses noted. Head:  normocephalic, normal hair pattern, no masses or tenderness Neck:  supple, without massess. No JVD, thyromegaly or carotid bruits. Carotid upstroke normal. Chest:  clear to auscultation and percussion, healed median sternotomy scar Abdomen:  abdomen soft,non-tender, no masses, no hepatospenomegaly, or aneurysm noted Peripheral Pulses:  femoral pulses 2+, bilateral femoral bruits present, distal pulses diminished Extremities & Back:  well healed saphenous vein donor site RLE, no edema present Neurological:  no gross motor or sensory deficits noted, affect appropriate, oriented x3. ____________________________ IMPRESSIONS/PLAN 1. Recent redo coronary bypass grafting limiting angina with saphenous vein grafts to right coronary artery 2. Postoperative atrial fibrillation treated with amiodarone 3. Peripheral vascular disease with severe claudication  Recommendations:  Referral to vascular surgeon was given. I asked him to reduce his amiodarone to 200 mg daily and to reduce his Ramipril to 2.5 mg daily. A new prescription for amlodipine 2.5 mg was sent in. I will plan to see him in followup in 6 weeks. ____________________________ TODAYS ORDERS  1. Vasc Surgery Consult: Schedule ASAP  2. Return Visit: 1 month                       ____________________________ Cardiology Physician:  Darden Palmer MD [facility]

## 2012-11-11 ENCOUNTER — Other Ambulatory Visit: Payer: Self-pay | Admitting: *Deleted

## 2012-11-11 DIAGNOSIS — I70219 Atherosclerosis of native arteries of extremities with intermittent claudication, unspecified extremity: Secondary | ICD-10-CM

## 2012-11-11 DIAGNOSIS — I739 Peripheral vascular disease, unspecified: Secondary | ICD-10-CM

## 2012-11-17 ENCOUNTER — Other Ambulatory Visit: Payer: Self-pay | Admitting: *Deleted

## 2012-11-18 ENCOUNTER — Other Ambulatory Visit: Payer: Self-pay | Admitting: Cardiothoracic Surgery

## 2012-11-18 ENCOUNTER — Ambulatory Visit (INDEPENDENT_AMBULATORY_CARE_PROVIDER_SITE_OTHER): Payer: Self-pay | Admitting: Cardiothoracic Surgery

## 2012-11-18 ENCOUNTER — Ambulatory Visit
Admission: RE | Admit: 2012-11-18 | Discharge: 2012-11-18 | Disposition: A | Discharge status: Home / Self Care | Payer: Medicare HMO | Source: Ambulatory Visit | Attending: Cardiothoracic Surgery | Admitting: Cardiothoracic Surgery

## 2012-11-18 ENCOUNTER — Encounter: Payer: Self-pay | Admitting: Cardiothoracic Surgery

## 2012-11-18 VITALS — BP 158/73 | HR 52 | Resp 18 | Ht 71.0 in | Wt 161.0 lb

## 2012-11-18 DIAGNOSIS — Z951 Presence of aortocoronary bypass graft: Secondary | ICD-10-CM

## 2012-11-18 DIAGNOSIS — I2510 Atherosclerotic heart disease of native coronary artery without angina pectoris: Secondary | ICD-10-CM

## 2012-11-18 NOTE — Progress Notes (Signed)
[address]       [place] [address]             [phone]                  Ian Cummings. [place] Medical Record #[id number] Date of Birth: [birth year]  Ian Boyer, MD Ian Baars, MD  Chief Complaint:   PostOp Follow Up Visit 10/18/2012  OPERATIVE REPORT  PREOPERATIVE DIAGNOSIS: Unstable angina with recurrent coronary  occlusive disease.  POSTOPERATIVE DIAGNOSIS: Unstable angina with recurrent coronary  occlusive disease.  PROCEDURE: Off-pump redo coronary artery bypass grafting x1 with  reverse saphenous vein graft to the right coronary system with left  thigh endovein harvesting.    History of Present Illness:     Co of itching, says got better after stopping metoprolol. Patient has returned to near normal activities, scraping and cleaning windows, sandblasting and painting a 25 foot flag pole. He's had no recurrent angina.    History  Smoking status  . Former Smoker  . Types: Cigarettes  . Quit date: 04/29/1975  Smokeless tobacco  . Never Used       No Known Allergies  Current Outpatient Prescriptions  Medication Sig Dispense Refill  . allopurinol (ZYLOPRIM) 300 MG tablet Take 300 mg by mouth daily.      Marland Kitchen amiodarone (PACERONE) 200 MG tablet Take 200 mg by mouth daily.      Marland Kitchen amLODipine (NORVASC) 10 MG tablet Take 10 mg by mouth daily.      Marland Kitchen aspirin 81 MG tablet Take 81 mg by mouth daily.      Marland Kitchen atenolol (TENORMIN) 50 MG tablet Take 50 mg by mouth daily.      . cholecalciferol (VITAMIN D) 1000 UNITS tablet Take 2,000 Units by mouth daily.      . clopidogrel (PLAVIX) 75 MG tablet Take 75 mg by mouth daily.      . fish oil-omega-3 fatty acids 1000 MG capsule Take 1 g by mouth daily.       . lansoprazole (PREVACID) 15 MG capsule Take 15 mg by mouth daily.      Marland Kitchen levothyroxine (SYNTHROID, LEVOTHROID) 25 MCG tablet Take 1 tablet (25 mcg total) by mouth daily before breakfast.  30 tablet  1  . multivitamin (RENA-VIT)  TABS tablet Take 1 tablet by mouth daily.      Marland Kitchen PARoxetine (PAXIL) 20 MG tablet Take 20 mg by mouth every morning.      . ramipril (ALTACE) 2.5 MG tablet Take 2.5 mg by mouth 2 (two) times daily.       . rosuvastatin (CRESTOR) 40 MG tablet Take 40 mg by mouth daily.      . vitamin B-12 (CYANOCOBALAMIN) 1000 MCG tablet Take 1,000 mcg by mouth daily.       No current facility-administered medications for this visit.       Physical Exam: BP 158/73  Pulse 52  Resp 18  Ht 5\' 11"  (1.803 m)  Wt 161 lb (73.029 kg)  BMI 22.46 kg/m2  SpO2 98%  General appearance: alert and cooperative Neurologic: intact Heart: regular rate and rhythm, S1, S2 normal, no murmur, click, rub or gallop Lungs: clear to auscultation bilaterally Abdomen: soft, non-tender; bowel sounds normal; no masses,  no organomegaly Extremities: extremities normal, atraumatic, no cyanosis or edema and Homans sign is negative, no sign of DVT Wound: sternum stable   Diagnostic Studies & Laboratory data:  Recent Radiology Findings: Dg Chest 2 View  11/18/2012   *RADIOLOGY REPORT*  Clinical Data: Post CABG.  CHEST - 2 VIEW  Comparison: 10/20/2012  Findings: There is hyperinflation of the lungs compatible with COPD.  Prior CABG.  Heart is borderline in size.  No confluent airspace opacities.  No effusions.  Degenerative changes in the thoracic spine.  IMPRESSION: COPD.  Prior CABG.  No acute findings.   Original Report Authenticated By: Charlett Nose, M.D.      Recent Labs: Lab Results  Component Value Date   WBC 9.1 10/21/2012   HGB 10.7* 10/21/2012   HCT 30.1* 10/21/2012   PLT 111* 10/21/2012   GLUCOSE 136* 10/24/2012   CHOL  Value: 109        ATP III CLASSIFICATION:  <200     mg/dL   Desirable  161-096  mg/dL   Borderline High  >=045    mg/dL   High        40/98/1191   TRIG 114 04/18/2009   HDL 34* 04/18/2009   LDLCALC  Value: 52        Total Cholesterol/HDL:CHD Risk Coronary Heart Disease Risk Table                      Men   Women  1/2 Average Risk   3.4   3.3  Average Risk       5.0   4.4  2 X Average Risk   9.6   7.1  3 X Average Risk  23.4   11.0        Use the calculated Patient Ratio above and the CHD Risk Table to determine the patient's CHD Risk.        ATP III CLASSIFICATION (LDL):  <100     mg/dL   Optimal  478-295  mg/dL   Near or Above                    Optimal  130-159  mg/dL   Borderline  621-308  mg/dL   High  >657     mg/dL   Very High 84/69/6295   ALT 22 10/15/2012   AST 26 10/15/2012   NA 134* 10/24/2012   K 3.3* 10/24/2012   CL 98 10/24/2012   CREATININE 0.91 10/24/2012   BUN 17 10/24/2012   CO2 27 10/24/2012   TSH 5.915* 10/21/2012   INR 1.18 10/18/2012   HGBA1C 5.1 10/15/2012      Assessment / Plan:    Patient has stopped lopressor for itching , he went back to  Atenolol  50 mg daily and started taking Ramipril 2.5 mg bid  Doing well post op Instructed not heavy lifting for 2 months Will see back prn     [name] 11/18/2012 4:36 PM

## 2012-11-22 ENCOUNTER — Telehealth (HOSPITAL_COMMUNITY): Payer: Self-pay | Admitting: Cardiac Rehabilitation

## 2012-11-22 NOTE — Telephone Encounter (Signed)
pc received from pt stating he is not interested in participating in cardiac rehab at this time.  Pt states he has several trips planned over next few weeks which he perceives will interfere with participation. Pt states [place]  Pt advised he is able to participate a later time if he is interested once his planned vacations are complete. Pt verbalized understanding.  Dr. Donnie Aho made aware

## 2012-11-25 ENCOUNTER — Ambulatory Visit (HOSPITAL_COMMUNITY): Payer: Medicare HMO

## 2012-11-29 ENCOUNTER — Ambulatory Visit (HOSPITAL_COMMUNITY): Payer: Medicare HMO

## 2012-12-01 ENCOUNTER — Ambulatory Visit (HOSPITAL_COMMUNITY): Payer: Medicare HMO

## 2012-12-03 ENCOUNTER — Ambulatory Visit (HOSPITAL_COMMUNITY): Payer: Medicare HMO

## 2012-12-06 ENCOUNTER — Ambulatory Visit (HOSPITAL_COMMUNITY): Payer: Medicare HMO

## 2012-12-08 ENCOUNTER — Ambulatory Visit (HOSPITAL_COMMUNITY): Payer: Medicare HMO

## 2012-12-10 ENCOUNTER — Ambulatory Visit (HOSPITAL_COMMUNITY): Payer: Medicare HMO

## 2012-12-13 ENCOUNTER — Encounter: Payer: Self-pay | Admitting: Cardiology

## 2012-12-13 ENCOUNTER — Ambulatory Visit: Payer: Medicare HMO | Admitting: Surgery

## 2012-12-13 ENCOUNTER — Ambulatory Visit (HOSPITAL_COMMUNITY): Payer: Medicare HMO

## 2012-12-13 DIAGNOSIS — E039 Hypothyroidism, unspecified: Secondary | ICD-10-CM

## 2012-12-13 DIAGNOSIS — I2510 Atherosclerotic heart disease of native coronary artery without angina pectoris: Secondary | ICD-10-CM

## 2012-12-13 DIAGNOSIS — I480 Paroxysmal atrial fibrillation: Secondary | ICD-10-CM | POA: Insufficient documentation

## 2012-12-13 DIAGNOSIS — I4891 Unspecified atrial fibrillation: Secondary | ICD-10-CM

## 2012-12-13 NOTE — Progress Notes (Unsigned)
Patient ID: Ian Cummings., male   DOB: Nov 15, 1928, 79 y.o.   MRN: [id number]   Ian Cummings, Ian Cummings  Date of visit:  12/13/2012 DOB:  02-14-1929    Age:  79 yrs. Medical record number:  [id number]     Account number:  [id number] Primary Care Provider: Buren Kos ____________________________ CURRENT DIAGNOSES  1. CAD. Bypass graft  2. CAD,Native  3. Hyperlipidemia  4. Carotid Artery Stenosis  5. Peripheral Vascular Disease  6. Surgery-Aortocoronary Bypass Grafting ____________________________ ALLERGIES  Metoprolol, Rash, itching ____________________________ MEDICATIONS  1. multivitamin tablet, 1 p.o. daily  2. Vitamin D3 1,000 unit tablet, 1 p.o. daily  3. Crestor 40 mg tablet, 1 p.o. daily  4. clopidogrel 75 mg tablet, 1 p.o. daily  5. lansoprazole 15 mg tablet,disintegrat, delay rel, 1 p.o. daily  6. Fish Oil 340-1,000 mg capsule, 1 p.o. daily  7. levothyroxine 25 mcg tablet, 1 p.o. daily  8. paroxetine HCl 20 mg tablet, 1 p.o. daily  9. Vitamin B-12 1,000 mcg tablet, 1 p.o. daily  10. aspirin 81 mg tablet,chewable, 1 p.o. daily  11. herbal drugs tablet, placebo or Evacetrapib  1 qd        DRUG STUDY  12. atenolol 50 mg tablet, 1 p.o. daily  13. ramipril 2.5 mg capsule, BID  14. allopurinol 100 mg tablet, 1 p.o. daily  15. amlodipine 2.5 mg tablet, Take as directed ____________________________ CHIEF COMPLAINTS  Followup of CAD,Native ____________________________ HISTORY OF PRESENT ILLNESS  Patient seen for cardiac followup. He is actually doing quite well now with zero angina. He has been able to do yard work and is still limited with claudication at about three fourths of a block. He is due to see the vascular surgeon soon. Since he was previously here he has been taking his blood pressure medicine a little differently than I had asked him to. The itching has now resolved off of metoprolol. He has had no recurrence of atrial fibrillation. He denies PND, orthopnea or edema. He is  not willing to go into cardiac rehabilitation. ____________________________ PAST HISTORY  Past Medical Illnesses:  hyperlipidemia, claudication-BLE, BPH, hypertension, gout, GERD;  Cardiovascular Illnesses:  CAD, peripheral vascular disease, Carotid artery disease;  Surgical Procedures:  CABG, carotid stent, inguinal herniorrhaphy-rt, inguinal herniorrhaphy-left, lt foot surgery, cataract extraction OU, tonsillectomy;  Cardiology Procedures-Invasive:  CABG w LIMA to LAD, SVG to OM1-OM2, SVG to dx, SVG to PD/PL 1995, Cypher stent x 2 SVG to RCA and Cypher stent to Circ Graft 05/13/07, stent of SVG to circ OM in  [place] in July 2013, redo CABG off pump Dr. Tyrone Sage 10/18/12;  Cardiology Procedures-Noninvasive:  echocardiogram, treadmill cardiolite;  Cardiac Cath Results:  normal Left main, occluded mid LAD, occluded RCA SVG, occluded CFX, widely patent OM 1 SVG, occluded RCA SVG, occluded Diag 1 SVG, widely patent LAD LIMA graft;  Peripheral Vascular Procedures:  carotid stent right 2013 [facility];  LVEF of 55% documented via echocardiogram on 10/14/2012,   ____________________________ Jacqulyn Bath TEST DATES EKG Date:  10/26/2012;   Cardiac Cath Date:  08/09/2012;  CABG: 08/07/1993;  Stent Placement Date: 11/02/2011;  Echocardiography Date: 10/14/2012;  Chest Xray Date: 09/06/2012;   ____________________________ SOCIAL HISTORY Alcohol Use:  wine 1-2 per day;  Smoking:  used to smoke but quit Prior to 1980;  Diet:  low sugar;  Lifestyle:  married;  Exercise:  some exercise;  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 Respiratory: denies dyspnea, cough, wheezing or hemoptysis. Cardiovascular:  please review HPI Abdominal: denies dyspepsia, GI bleeding, constipation, or diarrhea Genitourinary-Male: nocturia, erectile dysfunction  Musculoskeletal:   arthritis of the hands Neurological:  denies headaches, stroke, or TIA  ____________________________ PHYSICAL EXAMINATION VITAL SIGNS  Blood Pressure:  120/60 Sitting, Left arm, regular cuff   Pulse:  60/min. Weight:  165.00 lbs. Height:  71"BMI: 23  Constitutional:  pleasant white male in no acute distress Skin:  warm and dry to touch, no apparent skin lesions, or masses noted. Head:  normocephalic, normal hair pattern, no masses or tenderness Neck:  supple, without massess. No JVD, thyromegaly or carotid bruits. Carotid upstroke normal. Chest:  clear to auscultation and percussion, healed median sternotomy scar Abdomen:  abdomen soft,non-tender, no masses, no hepatospenomegaly, or aneurysm noted Peripheral Pulses:  femoral pulses 2+, bilateral femoral bruits present, distal pulses diminished Extremities & Back:  well healed saphenous vein donor site RLE, no edema present Neurological:  no gross motor or sensory deficits noted, affect appropriate, oriented x3. ____________________________ IMPRESSIONS/PLAN  1. Recent redo coronary bypass grafting with elimination of angina 2. Postoperative atrial fibrillation currently in sinus rhythm 3. Limiting claudication 4. Hyperlipidemia  Recommendations:  Asked him to continue ramipril 5 mg daily and to reduce his amlodipine to 2.5 mg daily. Discontinue amiodarone when it runs out. He is not willing to get into cardiac rehabilitation. Await vascular surgery opinion. Followup in 3 months. ____________________________ TODAYS ORDERS  1. Return Visit: 3 months                       ____________________________ Cardiology Physician:  Darden Palmer MD [facility]

## 2012-12-15 ENCOUNTER — Ambulatory Visit (HOSPITAL_COMMUNITY): Payer: Medicare HMO

## 2012-12-17 ENCOUNTER — Encounter: Payer: Self-pay | Admitting: Surgery

## 2012-12-17 ENCOUNTER — Ambulatory Visit (HOSPITAL_COMMUNITY): Payer: Medicare HMO

## 2012-12-20 ENCOUNTER — Encounter (INDEPENDENT_AMBULATORY_CARE_PROVIDER_SITE_OTHER): Payer: Medicare HMO | Admitting: *Deleted

## 2012-12-20 ENCOUNTER — Other Ambulatory Visit: Payer: Self-pay | Admitting: *Deleted

## 2012-12-20 ENCOUNTER — Encounter: Payer: Self-pay | Admitting: Surgery

## 2012-12-20 ENCOUNTER — Ambulatory Visit (INDEPENDENT_AMBULATORY_CARE_PROVIDER_SITE_OTHER): Payer: Medicare HMO | Admitting: Surgery

## 2012-12-20 ENCOUNTER — Ambulatory Visit (HOSPITAL_COMMUNITY): Payer: Medicare HMO

## 2012-12-20 VITALS — BP 156/63 | HR 48 | Ht 71.0 in | Wt 164.0 lb

## 2012-12-20 DIAGNOSIS — I739 Peripheral vascular disease, unspecified: Secondary | ICD-10-CM

## 2012-12-20 DIAGNOSIS — I70219 Atherosclerosis of native arteries of extremities with intermittent claudication, unspecified extremity: Secondary | ICD-10-CM

## 2012-12-20 NOTE — Progress Notes (Signed)
[facility] [place] HISTORY AND PHYSICAL   CC:  I'm here for my leg and hip pain  Ian Boyer, MD  HPI: This is a 79 y.o. male  Who recently underwent off pump CABG x 26 September 2012 by Dr. Tyrone Sage.  He presents today with complaints of cramping in his thighs and calves bilaterally when he walks.  He can walk 200-300 ft before he cramps and his pain is relieved with rest.  He does not have any non healing ulcers or sores.   He has also been seen by Dr. Hollace Hayward at [facility] in the past.  He denies any hx of stroke, but states a couple of years ago, he did have an episode of double vision for about an hour and this resolved.    He does have a hx of right carotid stenting by Dr. Kelby Aline at [facility] June 12, 2011.  Per the pt, he states that he does have ~ 70% blockage in the left carotid artery.  He denies any weakness or numbness in any extremity.     He is on a statin for his hypercholesterolemia.  He is also on a beta blocker for his heart disease/hypertension.  He is also on Plavix/ASA.  Past Medical History  Diagnosis Date  . CAD (coronary artery disease)   . Hyperlipidemia   . Hypertension   . Arthritis     Gout  . GERD (gastroesophageal reflux disease)   . MI (myocardial infarction) 4/14  . Gout   . BPH (benign prostatic hypertrophy)   . Carotid artery disease     Right carotid stent 2013  . TIA (transient ischemic attack)     Hx: of  . H/O hiatal hernia   . Neuropathy     Hx: of in B/L toes  . Stroke    Past Surgical History  Procedure Laterality Date  . Coronary artery bypass graft  1995  . Foot surgery    . Coronary angioplasty with stent placement  2010  . Hernia repair      X 2   . Carotid stent insertion  2013    right at [facility]  . Tonsillectomy    . Colonoscopy      Hx: of  . Eye surgery      Hx: of left eye  . Cataract surgery      Hx: of both eyes  . Fracture surgery      Hx: of Left heel  . Coronary artery bypass graft N/A  10/18/2012    Procedure: REDO CORONARY ARTERY BYPASS GRAFTING (CABG);  Surgeon: Delight Ovens, MD;  Location: [facility] OR;  Service: Open Heart Surgery;  Laterality: N/A;  off pump times two using endoscopically harvested left saphenous vein  . Intraoperative transesophageal echocardiogram N/A 10/18/2012    Procedure: INTRAOPERATIVE TRANSESOPHAGEAL ECHOCARDIOGRAM;  Surgeon: Delight Ovens, MD;  Location: [facility] OR;  Service: Open Heart Surgery;  Laterality: N/A;    Allergies  Allergen Reactions  . Metoprolol Rash    Current Outpatient Prescriptions  Medication Sig Dispense Refill  . allopurinol (ZYLOPRIM) 300 MG tablet Take 300 mg by mouth daily.      Marland Kitchen amiodarone (PACERONE) 200 MG tablet Take 200 mg by mouth daily.      Marland Kitchen amLODipine (NORVASC) 10 MG tablet Take 10 mg by mouth daily.      Marland Kitchen aspirin 81 MG tablet Take 81 mg by mouth daily.      Marland Kitchen atenolol (TENORMIN)  50 MG tablet Take 50 mg by mouth daily.      . cholecalciferol (VITAMIN D) 1000 UNITS tablet Take 2,000 Units by mouth daily.      . clopidogrel (PLAVIX) 75 MG tablet Take 75 mg by mouth daily.      . fish oil-omega-3 fatty acids 1000 MG capsule Take 1 g by mouth daily.       . lansoprazole (PREVACID) 15 MG capsule Take 15 mg by mouth daily.      Marland Kitchen levothyroxine (SYNTHROID, LEVOTHROID) 25 MCG tablet Take 1 tablet (25 mcg total) by mouth daily before breakfast.  30 tablet  1  . multivitamin (RENA-VIT) TABS tablet Take 1 tablet by mouth daily.      Marland Kitchen PARoxetine (PAXIL) 20 MG tablet Take 20 mg by mouth every morning.      . ramipril (ALTACE) 2.5 MG tablet Take 2.5 mg by mouth 2 (two) times daily.       . rosuvastatin (CRESTOR) 40 MG tablet Take 40 mg by mouth daily.      . vitamin B-12 (CYANOCOBALAMIN) 1000 MCG tablet Take 1,000 mcg by mouth daily.       No current facility-administered medications for this visit.    Family History  Problem Relation Age of Onset  . Heart disease Mother   . Heart attack Mother   . Hyperlipidemia  Father   . Hypertension Father   . Heart disease Father   . Diabetes Father   . Heart attack Father     History   Social History  . Marital Status: Married    Spouse Name: N/A    Number of Children: N/A  . Years of Education: N/A   Occupational History  . Not on file.   Social History Main Topics  . Smoking status: Former Smoker    Types: Cigarettes    Quit date: 04/29/1975  . Smokeless tobacco: Never Used  . Alcohol Use: 10.8 - 12 oz/week    12 Glasses of wine, 6-8 Shots of liquor per week     Comment: Occasional drink  . Drug Use: No  . Sexual Activity: Not Currently   Other Topics Concern  . 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     ROS: [x]  Positive   [ ]  Negative   [ ]  All sytems reviewed and are negative  Cardiovascular: [x]  hx of CABG 09/2012 [x]  hx of right carotid stenting 2013 [x]  left carotid stenosis ~ 70% []  chest pain/pressure []  palpitations []  SOB lying flat []  DOE [x]  pain in legs while walking []  pain in feet when lying flat []  hx of DVT []  hx of phlebitis []  swelling in legs []  varicose veins  Pulmonary: []  productive cough []  asthma []  wheezing  Neurologic: []  weakness in []  arms []  legs []  numbness in []  arms []  legs [] difficulty speaking or slurred speech []  temporary loss of vision in one eye []  dizziness  Hematologic: []  bleeding problems []  problems with blood clotting easily  GI []  vomiting blood []  blood in stool  GU: []  burning with urination []  blood in urine  Psychiatric: []  hx of major depression  Integumentary: []  rashes []  ulcers  Constitutional: []  fever []  chills   PHYSICAL EXAMINATION:  Filed Vitals:   12/20/12 1411  BP: 156/63  Pulse: 48   Body mass index is 22.88 kg/(m^2).  General:  WDWN in NAD Gait: Not observed HENT: WNL, normocephalic Eyes: Pupils equal Pulmonary:  normal non-labored breathing , without Rales, rhonchi,  wheezing Cardiac: RRR,  without  Murmurs, rubs or gallops; without carotid bruits Abdomen: soft, NT, no masses Skin: without rashes, without ulcers  Vascular Exam/Pulses:+ palpable radial pulses bilaterally; + palpable femoral pulses bilaterally; pedal pulses are not palpable Extremities: without ischemic changes, without Gangrene , without cellulitis; without open wounds;  Musculoskeletal: no muscle wasting or atrophy  Neurologic: A&O X 3; Appropriate Affect ; SENSATION: normal; MOTOR FUNCTION:  moving all extremities equally. Speech is fluent/normal   Non-Invasive Vascular Imaging:    Lower extremity arterial duplex evaluation 12/20/12: 1.  Larger heterogeneous plaque noted in the right CFA with minimal color flow noted throughout the SFA 2.  Minimal flow noted in the left SFA 3. Dense and irregular plaque noted throughout the bilateral lower extremities  ABI's 12/20/12: Right:  0.50 Left:  0.65  ABI's 03/25/11 Right:  0.54 Left:  0.66    ASSESSMENT/PLAN: 79 y.o. male with hx of PAD/claudication   -the pt does have claudication-Dr. Myra Gianotti had extensive conversation with pt about progression of disease and interventions.  Pt agrees on beginning with exercise program to increase ambulation.  Dr. Donnie Aho also wants pt on exercise regimen.   -We will see him back in 6 months to re-evaluate.  If his symptoms have not greatly improved, he will need an arteriogram to determine further interventions.  He has had the saphenous veins in both legs removed and would need a gortex graft if in need of lower extremity bypass in the future.  -pt does have hx of carotid artery stenosis with hx of carotid stenting at [facility].  He will need f/u there for his carotid artery stenosis.   Doreatha Massed, PA-C [facility] [phone]  Clinic MD:  Pt seen and examined in conjunction with Dr. Myra Gianotti   I agree with the above. The patient has been seen and examined. We had a lengthy conversation, 30 minutes,  discussing his symptoms and our treatment options. Basically, he was doing well with exercise prior to his CABG. He has not participated significant exercise since his operation. I discussed our treatment options which include continued medical management versus intervention. I propose proceed with angiography to better define his anatomy and potentially intervene at that time. The patient after our lengthy discussion with like to try to exercise to see if he can tolerate his symptoms for at least another several months. I have scheduled him for followup in 6 months. His duplex revealed femoral disease bilaterally. He potentially will require surgical revascularization if indeed his disease is confined to the groin. His symptoms are certainly not lifestyle limiting but due to impact his quality of life.  Durene Cal

## 2012-12-22 ENCOUNTER — Ambulatory Visit (HOSPITAL_COMMUNITY): Payer: Medicare HMO

## 2012-12-24 ENCOUNTER — Ambulatory Visit (HOSPITAL_COMMUNITY): Payer: Medicare HMO

## 2012-12-29 ENCOUNTER — Ambulatory Visit (HOSPITAL_COMMUNITY): Payer: Medicare HMO

## 2012-12-31 ENCOUNTER — Ambulatory Visit (HOSPITAL_COMMUNITY): Payer: Medicare HMO

## 2013-01-03 ENCOUNTER — Ambulatory Visit (HOSPITAL_COMMUNITY): Payer: Medicare HMO

## 2013-01-05 ENCOUNTER — Ambulatory Visit (HOSPITAL_COMMUNITY): Payer: Medicare HMO

## 2013-01-07 ENCOUNTER — Ambulatory Visit (HOSPITAL_COMMUNITY): Payer: Medicare HMO

## 2013-01-10 ENCOUNTER — Ambulatory Visit (HOSPITAL_COMMUNITY): Payer: Medicare HMO

## 2013-01-12 ENCOUNTER — Ambulatory Visit (HOSPITAL_COMMUNITY): Payer: Medicare HMO

## 2013-01-14 ENCOUNTER — Ambulatory Visit (HOSPITAL_COMMUNITY): Payer: Medicare HMO

## 2013-01-17 ENCOUNTER — Ambulatory Visit (HOSPITAL_COMMUNITY): Payer: Medicare HMO

## 2013-01-19 ENCOUNTER — Ambulatory Visit (HOSPITAL_COMMUNITY): Payer: Medicare HMO

## 2013-01-21 ENCOUNTER — Ambulatory Visit (HOSPITAL_COMMUNITY): Payer: Medicare HMO

## 2013-01-24 ENCOUNTER — Ambulatory Visit (HOSPITAL_COMMUNITY): Payer: Medicare HMO

## 2013-01-25 ENCOUNTER — Encounter (HOSPITAL_COMMUNITY): Payer: Self-pay | Admitting: Emergency Medicine

## 2013-01-25 DIAGNOSIS — I25810 Atherosclerosis of coronary artery bypass graft(s) without angina pectoris: Secondary | ICD-10-CM | POA: Insufficient documentation

## 2013-01-25 DIAGNOSIS — R0789 Other chest pain: Principal | ICD-10-CM | POA: Insufficient documentation

## 2013-01-25 DIAGNOSIS — E785 Hyperlipidemia, unspecified: Secondary | ICD-10-CM | POA: Insufficient documentation

## 2013-01-25 DIAGNOSIS — I10 Essential (primary) hypertension: Secondary | ICD-10-CM | POA: Insufficient documentation

## 2013-01-25 DIAGNOSIS — M109 Gout, unspecified: Secondary | ICD-10-CM | POA: Insufficient documentation

## 2013-01-25 DIAGNOSIS — Z79899 Other long term (current) drug therapy: Secondary | ICD-10-CM | POA: Insufficient documentation

## 2013-01-25 DIAGNOSIS — I2510 Atherosclerotic heart disease of native coronary artery without angina pectoris: Secondary | ICD-10-CM | POA: Insufficient documentation

## 2013-01-25 DIAGNOSIS — E039 Hypothyroidism, unspecified: Secondary | ICD-10-CM | POA: Insufficient documentation

## 2013-01-25 DIAGNOSIS — I739 Peripheral vascular disease, unspecified: Secondary | ICD-10-CM | POA: Insufficient documentation

## 2013-01-25 LAB — CBC
MCH: 31.6 pg (ref 26.0–34.0)
MCHC: 35.7 g/dL (ref 30.0–36.0)
MCV: 88.5 fL (ref 78.0–100.0)
Platelets: 143 10*3/uL — ABNORMAL LOW (ref 150–400)
RBC: 4.27 MIL/uL (ref 4.22–5.81)

## 2013-01-25 LAB — POCT I-STAT TROPONIN I: Troponin i, poc: 0 ng/mL (ref 0.00–0.08)

## 2013-01-25 NOTE — ED Notes (Signed)
Patient transported to X-ray 

## 2013-01-25 NOTE — ED Notes (Addendum)
Pt. reports left chest pain / tightness onset this evening , deneis SOB ,nausea or diaphoresis . Pt. took 2 NTG sl prior to arrival rates pain 4/10 at triage. Pt. states history of CAD / CABG his cardiologist is Dr. Donnie Aho .

## 2013-01-26 ENCOUNTER — Observation Stay (HOSPITAL_COMMUNITY)
Admission: EM | Admit: 2013-01-26 | Discharge: 2013-01-27 | Disposition: A | Discharge status: Home / Self Care | Payer: Medicare HMO | Attending: Cardiology | Admitting: Cardiology

## 2013-01-26 ENCOUNTER — Ambulatory Visit (HOSPITAL_COMMUNITY): Payer: Medicare HMO

## 2013-01-26 ENCOUNTER — Emergency Department (HOSPITAL_COMMUNITY): Payer: Medicare HMO

## 2013-01-26 ENCOUNTER — Encounter (HOSPITAL_COMMUNITY): Payer: Self-pay | Admitting: Internal Medicine

## 2013-01-26 DIAGNOSIS — R079 Chest pain, unspecified: Secondary | ICD-10-CM

## 2013-01-26 DIAGNOSIS — E785 Hyperlipidemia, unspecified: Secondary | ICD-10-CM | POA: Diagnosis present

## 2013-01-26 DIAGNOSIS — I25110 Atherosclerotic heart disease of native coronary artery with unstable angina pectoris: Secondary | ICD-10-CM | POA: Diagnosis present

## 2013-01-26 DIAGNOSIS — E782 Mixed hyperlipidemia: Secondary | ICD-10-CM | POA: Diagnosis present

## 2013-01-26 DIAGNOSIS — I129 Hypertensive chronic kidney disease with stage 1 through stage 4 chronic kidney disease, or unspecified chronic kidney disease: Secondary | ICD-10-CM | POA: Diagnosis present

## 2013-01-26 DIAGNOSIS — I2510 Atherosclerotic heart disease of native coronary artery without angina pectoris: Secondary | ICD-10-CM | POA: Diagnosis present

## 2013-01-26 DIAGNOSIS — E039 Hypothyroidism, unspecified: Secondary | ICD-10-CM | POA: Diagnosis present

## 2013-01-26 LAB — BASIC METABOLIC PANEL
CO2: 22 mEq/L (ref 19–32)
Calcium: 8.8 mg/dL (ref 8.4–10.5)
Creatinine, Ser: 1.03 mg/dL (ref 0.50–1.35)
GFR calc non Af Amer: 66 mL/min — ABNORMAL LOW (ref >90)
Glucose, Bld: 136 mg/dL — ABNORMAL HIGH (ref 70–99)

## 2013-01-26 LAB — TROPONIN I
Troponin I: <0.3 ng/mL (ref <0.30)
Troponin I: <0.3 ng/mL (ref <0.30)

## 2013-01-26 MED ORDER — AMIODARONE HCL 200 MG PO TABS
200.0000 mg | ORAL_TABLET | Freq: Every day | ORAL | Status: DC
  Administered 2013-01-26: 200 mg via ORAL
  Filled 2013-01-26 (×2): qty 1

## 2013-01-26 MED ORDER — ONDANSETRON HCL 4 MG/2ML IJ SOLN: 4.0000 mg | Freq: Four times a day (QID) | INTRAMUSCULAR | Status: DC | PRN

## 2013-01-26 MED ORDER — ACETAMINOPHEN 325 MG PO TABS: 650.0000 mg | ORAL_TABLET | ORAL | Status: DC | PRN

## 2013-01-26 MED ORDER — PANTOPRAZOLE SODIUM 20 MG PO TBEC
20.0000 mg | DELAYED_RELEASE_TABLET | Freq: Every day | ORAL | Status: DC
  Administered 2013-01-26 – 2013-01-27 (×2): 20 mg via ORAL
  Filled 2013-01-26 (×2): qty 1

## 2013-01-26 MED ORDER — PAROXETINE HCL 20 MG PO TABS
20.0000 mg | ORAL_TABLET | Freq: Every day | ORAL | Status: DC
  Administered 2013-01-26 – 2013-01-27 (×2): 20 mg via ORAL
  Filled 2013-01-26 (×2): qty 1

## 2013-01-26 MED ORDER — ATENOLOL 50 MG PO TABS
50.0000 mg | ORAL_TABLET | Freq: Every day | ORAL | Status: DC
  Administered 2013-01-26: 50 mg via ORAL
  Filled 2013-01-26 (×2): qty 1

## 2013-01-26 MED ORDER — ASPIRIN 81 MG PO CHEW
81.0000 mg | CHEWABLE_TABLET | Freq: Every day | ORAL | Status: DC
  Administered 2013-01-26 – 2013-01-27 (×2): 81 mg via ORAL
  Filled 2013-01-26 (×3): qty 1

## 2013-01-26 MED ORDER — RENA-VITE PO TABS
1.0000 | ORAL_TABLET | Freq: Every day | ORAL | Status: DC
  Administered 2013-01-26 – 2013-01-27 (×2): 1 via ORAL
  Filled 2013-01-26 (×2): qty 1

## 2013-01-26 MED ORDER — NITROGLYCERIN 0.4 MG SL SUBL
0.4000 mg | SUBLINGUAL_TABLET | SUBLINGUAL | Status: DC | PRN
  Administered 2013-01-26 (×2): 0.4 mg via SUBLINGUAL

## 2013-01-26 MED ORDER — VITAMIN B-12 1000 MCG PO TABS
1000.0000 ug | ORAL_TABLET | Freq: Every day | ORAL | Status: DC
  Administered 2013-01-26 – 2013-01-27 (×2): 1000 ug via ORAL
  Filled 2013-01-26 (×2): qty 1

## 2013-01-26 MED ORDER — VITAMIN D3 25 MCG (1000 UNIT) PO TABS
2000.0000 [IU] | ORAL_TABLET | Freq: Every day | ORAL | Status: DC
  Administered 2013-01-26 – 2013-01-27 (×2): 2000 [IU] via ORAL
  Filled 2013-01-26 (×2): qty 2

## 2013-01-26 MED ORDER — ATORVASTATIN CALCIUM 80 MG PO TABS
80.0000 mg | ORAL_TABLET | Freq: Every day | ORAL | Status: DC
  Administered 2013-01-26: 80 mg via ORAL
  Filled 2013-01-26 (×2): qty 1

## 2013-01-26 MED ORDER — ASPIRIN 81 MG PO CHEW
162.0000 mg | CHEWABLE_TABLET | Freq: Once | ORAL | Status: AC
  Administered 2013-01-26: 162 mg via ORAL
  Filled 2013-01-26: qty 2

## 2013-01-26 MED ORDER — ALLOPURINOL 300 MG PO TABS
300.0000 mg | ORAL_TABLET | Freq: Every day | ORAL | Status: DC
  Administered 2013-01-26 – 2013-01-27 (×2): 300 mg via ORAL
  Filled 2013-01-26 (×2): qty 1

## 2013-01-26 MED ORDER — CLOPIDOGREL BISULFATE 75 MG PO TABS
75.0000 mg | ORAL_TABLET | Freq: Every day | ORAL | Status: DC
  Administered 2013-01-26 – 2013-01-27 (×2): 75 mg via ORAL
  Filled 2013-01-26 (×2): qty 1

## 2013-01-26 MED ORDER — OMEGA-3-ACID ETHYL ESTERS 1 G PO CAPS
1.0000 g | ORAL_CAPSULE | Freq: Every day | ORAL | Status: DC
  Administered 2013-01-26 – 2013-01-27 (×2): 1 g via ORAL
  Filled 2013-01-26 (×2): qty 1

## 2013-01-26 MED ORDER — AMLODIPINE BESYLATE 10 MG PO TABS
10.0000 mg | ORAL_TABLET | Freq: Every day | ORAL | Status: DC
  Administered 2013-01-26 – 2013-01-27 (×2): 10 mg via ORAL
  Filled 2013-01-26 (×2): qty 1

## 2013-01-26 MED ORDER — LEVOTHYROXINE SODIUM 25 MCG PO TABS
25.0000 ug | ORAL_TABLET | Freq: Every day | ORAL | Status: DC
  Administered 2013-01-26 – 2013-01-27 (×2): 25 ug via ORAL
  Filled 2013-01-26 (×3): qty 1

## 2013-01-26 MED ORDER — RAMIPRIL 2.5 MG PO CAPS
2.5000 mg | ORAL_CAPSULE | Freq: Two times a day (BID) | ORAL | Status: DC
  Administered 2013-01-26 – 2013-01-27 (×3): 2.5 mg via ORAL
  Filled 2013-01-26 (×4): qty 1

## 2013-01-26 NOTE — ED Provider Notes (Signed)
CSN: [id number]     Arrival date & time 01/25/13  2313 History   First MD Initiated Contact with Patient 01/26/13 0124     Chief Complaint  Patient presents with  . Chest Pain   (Consider location/radiation/quality/duration/timing/severity/associated sxs/prior Treatment) HPI Comments: Patient with past medical history significant for what area artery disease. He is status post bypass surgery in 1996 and has been stented multiple times since then. He most recently underwent an additional bypass surgery in June of 2014. He has been doing well since that time. This evening after helping clean his son's apartment he developed tightness in his chest that was reminiscent of his anginal pain. He took sublingual nitroglycerin and this improved. By the time he got here he was pain-free. Shortly after arriving here his pain returned.  Patient is a 79 y.o. male presenting with chest pain. The history is provided by the patient.  Chest Pain Pain location:  Substernal area Pain quality: tightness   Pain radiates to:  Does not radiate Pain radiates to the back: no   Pain severity:  Moderate Onset quality:  Sudden Timing:  Constant Progression:  Partially resolved Chronicity:  Recurrent Context: not breathing   Relieved by:  Nitroglycerin Worsened by:  Nothing tried Ineffective treatments:  None tried Associated symptoms: no abdominal pain     Past Medical History  Diagnosis Date  . CAD (coronary artery disease)   . Hyperlipidemia   . Hypertension   . Arthritis     Gout  . GERD (gastroesophageal reflux disease)   . MI (myocardial infarction) 4/14  . Gout   . BPH (benign prostatic hypertrophy)   . Carotid artery disease     Right carotid stent 2013  . TIA (transient ischemic attack)     Hx: of  . H/O hiatal hernia   . Neuropathy     Hx: of in B/L toes  . Stroke    Past Surgical History  Procedure Laterality Date  . Coronary artery bypass graft  1995  . Foot surgery    . Coronary  angioplasty with stent placement  2010  . Hernia repair      X 2   . Carotid stent insertion  2013    right at [facility]  . Tonsillectomy    . Colonoscopy      Hx: of  . Eye surgery      Hx: of left eye  . Cataract surgery      Hx: of both eyes  . Fracture surgery      Hx: of Left heel  . Coronary artery bypass graft N/A 10/18/2012    Procedure: REDO CORONARY ARTERY BYPASS GRAFTING (CABG);  Surgeon: Delight Ovens, MD;  Location: [facility] OR;  Service: Open Heart Surgery;  Laterality: N/A;  off pump times two using endoscopically harvested left saphenous vein  . Intraoperative transesophageal echocardiogram N/A 10/18/2012    Procedure: INTRAOPERATIVE TRANSESOPHAGEAL ECHOCARDIOGRAM;  Surgeon: Delight Ovens, MD;  Location: [facility] OR;  Service: Open Heart Surgery;  Laterality: N/A;   Family History  Problem Relation Age of Onset  . Heart disease Mother   . Heart attack Mother   . Hyperlipidemia Father   . Hypertension Father   . Heart disease Father   . Diabetes Father   . Heart attack Father    History  Substance Use Topics  . Smoking status: Former Smoker    Types: Cigarettes    Quit date: 04/29/1975  . Smokeless tobacco: Never Used  .  Alcohol Use: 10.8 - 12 oz/week    12 Glasses of wine, 6-8 Shots of liquor per week     Comment: Occasional drink    Review of Systems  Cardiovascular: Positive for chest pain.  Gastrointestinal: Negative for abdominal pain.  All other systems reviewed and are negative.    Allergies  Metoprolol  Home Medications   Current Outpatient Rx  Name  Route  Sig  Dispense  Refill  . allopurinol (ZYLOPRIM) 300 MG tablet   Oral   Take 300 mg by mouth daily.         Marland Kitchen amiodarone (PACERONE) 200 MG tablet   Oral   Take 200 mg by mouth daily.         Marland Kitchen amLODipine (NORVASC) 10 MG tablet   Oral   Take 10 mg by mouth daily.         Marland Kitchen aspirin 81 MG tablet   Oral   Take 81 mg by mouth daily.         Marland Kitchen atenolol (TENORMIN) 50 MG  tablet   Oral   Take 50 mg by mouth daily.         . cholecalciferol (VITAMIN D) 1000 UNITS tablet   Oral   Take 2,000 Units by mouth daily.         . clopidogrel (PLAVIX) 75 MG tablet   Oral   Take 75 mg by mouth daily.         . fish oil-omega-3 fatty acids 1000 MG capsule   Oral   Take 1 g by mouth daily.          . lansoprazole (PREVACID) 15 MG capsule   Oral   Take 15 mg by mouth daily.         Marland Kitchen levothyroxine (SYNTHROID, LEVOTHROID) 25 MCG tablet   Oral   Take 1 tablet (25 mcg total) by mouth daily before breakfast.   30 tablet   1   . multivitamin (RENA-VIT) TABS tablet   Oral   Take 1 tablet by mouth daily.         Marland Kitchen PARoxetine (PAXIL) 20 MG tablet   Oral   Take 20 mg by mouth every morning.         . ramipril (ALTACE) 2.5 MG tablet   Oral   Take 2.5 mg by mouth 2 (two) times daily.          . rosuvastatin (CRESTOR) 40 MG tablet   Oral   Take 40 mg by mouth daily.         . vitamin B-12 (CYANOCOBALAMIN) 1000 MCG tablet   Oral   Take 1,000 mcg by mouth daily.          BP 184/70  Pulse 58  Temp(Src) 98.2 F (36.8 C) (Oral)  Resp 14  SpO2 99% Physical Exam  Nursing note and vitals reviewed. Constitutional: He is oriented to person, place, and time. He appears well-developed and well-nourished. No distress.  HENT:  Head: Normocephalic and atraumatic.  Mouth/Throat: Oropharynx is clear and moist.  Neck: Normal range of motion. Neck supple.  Cardiovascular: Normal rate, regular rhythm and normal heart sounds.   No murmur heard. Pulmonary/Chest: Effort normal and breath sounds normal. No respiratory distress. He has no wheezes.  Abdominal: Soft. Bowel sounds are normal. He exhibits no distension. There is no tenderness.  Musculoskeletal: Normal range of motion. He exhibits no edema.  Neurological: He is alert and oriented to person, place, and time.  Skin: Skin is warm and dry. He is not diaphoretic.    ED Course  Procedures  (including critical care time) Labs Review Labs Reviewed  CBC - Abnormal; Notable for the following:    HCT 37.8 (*)    Platelets 143 (*)    All other components within normal limits  BASIC METABOLIC PANEL - Abnormal; Notable for the following:    Glucose, Bld 136 (*)    BUN 24 (*)    GFR calc non Af Amer 66 (*)    GFR calc Af Amer 77 (*)    All other components within normal limits  POCT I-STAT TROPONIN I   Imaging Review Dg Chest 2 View  01/26/2013   CLINICAL DATA:  Chest pain and shortness of Breath.  EXAM: CHEST  2 VIEW  COMPARISON:  11/18/2012.  FINDINGS: The cardiac silhouette, mediastinal and hilar contours are within normal limits and stable. Stable surgical changes from bypass surgery. Mild chronic bronchitic type lung changes but no acute pulmonary findings. Stable basilar scarring changes. The bony thorax is intact.  IMPRESSION: Chronic lung changes but no acute pulmonary findings.   Electronically Signed   By: Loralie Champagne M.D.   On: 01/26/2013 00:09    Date: 01/26/2013  Rate: 59  Rhythm: sinus bradycardia  QRS Axis: left  Intervals: normal  ST/T Wave abnormalities: normal  Conduction Disutrbances:nonspecific intraventricular conduction delay  Narrative Interpretation:   Old EKG Reviewed: unchanged   MDM  No diagnosis found. Patient with extensive cardiac history presents this evening with chest pain that occurred at rest, then recurred again at rest in the emergency department. Both of these were alleviated with nitroglycerin. Patient states that these were similar to his prior cardiac pain. Cardiac workup reveals no elevation of troponin and EKG is unchanged. Due to his extensive cardiac history I feel as though cardiology consultation is indicated. I have spoken with the cardiology fellow who will see the patient in the emergency department.    Geoffery Lyons, MD 01/26/13 [phone]

## 2013-01-26 NOTE — H&P (Signed)
Ian Cummings. is an 79 y.o. male.   Chief Complaint: Chest Pain HPI: [name] is an 79 yo man with PMH of CAD, first CABG with LIMA to LAD, SVG to OM1/OM2, SVG to Diagonal, SVG to PD/PL in '95 by dr. Tyrone Sage, cypher stent x2 to SVG to RCA and cypher stent to LCx graft 1/09, stent of SVT to LCx OM in [place] in 07/13 and most recently redo CABG off pump 10/18/12 by Dr. Tyrone Sage after an early June 2014 presentation to [facility] with CP and fond to have NSTEMI. At that time he had a LHC with patent LIMA to LAD but significant native disease, occluded mid LAD, occluded SVG to RCA, occluded LCx, patent SVG to OM, occluded SVG to RCA, occluded SVG to diagonal. He also has had a right carotid stent at [facility] in 2013, postoperative atrial fibrillation treated with brief course of amiodarone, PVD with severe claudication. He worked harder than he has today since having his surgery in June. He cleaned and scrubbed his son's home and his home today. He had a martini earlier in the evening and then sometime after 10 pm he had left upper abdominal pain that moved towards his left shoulder, pressure-sensation exactly like the pain he's had dating back to the 1990s that led to his CABG and redoCABG in June 2014. He has had no pain at all since CABG. He had no associated SOB. The pain lasted 10-15 minutes but responded to 2 SL NTG and recurred en route to driving to the hospital and briefly on arrival but he has been CP free without other therapies for a few hours now. Otherwise, no fever/chills, no nausea/vomiting, tolerating aspirin/plavix at home, no sick contacts, no weight loss, good spirits but he is frustrated about having pain again and he cannot sleep currently.     Past Medical History  Diagnosis Date  . CAD (coronary artery disease)   . Hyperlipidemia   . Hypertension   . Arthritis     Gout  . GERD (gastroesophageal reflux disease)   . MI (myocardial infarction) 4/14  . Gout   . BPH (benign prostatic  hypertrophy)   . Carotid artery disease     Right carotid stent 2013  . TIA (transient ischemic attack)     Hx: of  . H/O hiatal hernia   . Neuropathy     Hx: of in B/L toes  . Stroke     Past Surgical History  Procedure Laterality Date  . Coronary artery bypass graft  1995  . Foot surgery    . Coronary angioplasty with stent placement  2010  . Hernia repair      X 2   . Carotid stent insertion  2013    right at [facility]  . Tonsillectomy    . Colonoscopy      Hx: of  . Eye surgery      Hx: of left eye  . Cataract surgery      Hx: of both eyes  . Fracture surgery      Hx: of Left heel  . Coronary artery bypass graft N/A 10/18/2012    Procedure: REDO CORONARY ARTERY BYPASS GRAFTING (CABG);  Surgeon: Delight Ovens, MD;  Location: [facility] OR;  Service: Open Heart Surgery;  Laterality: N/A;  off pump times two using endoscopically harvested left saphenous vein  . Intraoperative transesophageal echocardiogram N/A 10/18/2012    Procedure: INTRAOPERATIVE TRANSESOPHAGEAL ECHOCARDIOGRAM;  Surgeon: Delight Ovens, MD;  Location:  [facility] OR;  Service: Open Heart Surgery;  Laterality: N/A;    Family History  Problem Relation Age of Onset  . Heart disease Mother   . Heart attack Mother   . Hyperlipidemia Father   . Hypertension Father   . Heart disease Father   . Diabetes Father   . Heart attack Father    Social History:  reports that he quit smoking about 37 years ago. His smoking use included Cigarettes. He smoked 0.00 packs per day. He has never used smokeless tobacco. He reports that he drinks about 10.8 ounces of alcohol per week. He reports that he does not use illicit drugs.  Allergies:  Allergies  Allergen Reactions  . Metoprolol Rash     (Not in a hospital admission)  Results for orders placed during the hospital encounter of 01/26/13 (from the past 48 hour(s))  CBC     Status: Abnormal   Collection Time    01/25/13 11:25 PM      Result Value Range   WBC 6.2   4.0 - 10.5 K/uL   RBC 4.27  4.22 - 5.81 MIL/uL   Hemoglobin 13.5  13.0 - 17.0 g/dL   HCT 52.8 (*) 41.3 - 24.4 %   MCV 88.5  78.0 - 100.0 fL   MCH 31.6  26.0 - 34.0 pg   MCHC 35.7  30.0 - 36.0 g/dL   RDW 01.0  27.2 - 53.6 %   Platelets 143 (*) 150 - 400 K/uL  BASIC METABOLIC PANEL     Status: Abnormal   Collection Time    01/25/13 11:25 PM      Result Value Range   Sodium 140  135 - 145 mEq/L   Potassium 3.6  3.5 - 5.1 mEq/L   Chloride 106  96 - 112 mEq/L   CO2 22  19 - 32 mEq/L   Glucose, Bld 136 (*) 70 - 99 mg/dL   BUN 24 (*) 6 - 23 mg/dL   Creatinine, Ser 6.44  0.50 - 1.35 mg/dL   Calcium 8.8  8.4 - 03.4 mg/dL   GFR calc non Af Amer 66 (*) >90 mL/min   GFR calc Af Amer 77 (*) >90 mL/min   Comment: (NOTE)     The eGFR has been calculated using the CKD EPI equation.     This calculation has not been validated in all clinical situations.     eGFR's persistently <90 mL/min signify possible Chronic Kidney     Disease.  POCT I-STAT TROPONIN I     Status: None   Collection Time    01/25/13 11:37 PM      Result Value Range   Troponin i, poc 0.00  0.00 - 0.08 ng/mL   Comment 3            Comment: Due to the release kinetics of cTnI,     a negative result within the first hours     of the onset of symptoms does not rule out     myocardial infarction with certainty.     If myocardial infarction is still suspected,     repeat the test at appropriate intervals.   Dg Chest 2 View  01/26/2013   CLINICAL DATA:  Chest pain and shortness of Breath.  EXAM: CHEST  2 VIEW  COMPARISON:  11/18/2012.  FINDINGS: The cardiac silhouette, mediastinal and hilar contours are within normal limits and stable. Stable surgical changes from bypass surgery. Mild chronic bronchitic type lung changes but  no acute pulmonary findings. Stable basilar scarring changes. The bony thorax is intact.  IMPRESSION: Chronic lung changes but no acute pulmonary findings.   Electronically Signed   By: Loralie Champagne M.D.    On: 01/26/2013 00:09    Review of Systems  Constitutional: Negative for fever, chills and weight loss.  HENT: Negative for ear pain and neck pain.   Eyes: Negative for double vision, photophobia and pain.  Respiratory: Negative for cough and shortness of breath.   Cardiovascular: Positive for chest pain and claudication. Negative for palpitations, orthopnea and PND.  Gastrointestinal: Negative for heartburn, nausea, vomiting and abdominal pain.  Genitourinary: Negative for dysuria, urgency and frequency.  Musculoskeletal: Negative for myalgias.  Neurological: Negative for dizziness, tingling, tremors and headaches.  Endo/Heme/Allergies: Negative for environmental allergies. Does not bruise/bleed easily.  Psychiatric/Behavioral: Negative for suicidal ideas, hallucinations and substance abuse. The patient is nervous/anxious and has insomnia.     Blood pressure 172/70, pulse 55, temperature 98.2 F (36.8 C), temperature source Oral, resp. rate 17, SpO2 96.00%. Physical Exam  Nursing note and vitals reviewed. Constitutional: He is oriented to person, place, and time. He appears well-developed and well-nourished. No distress.  Appears younger than stated age  HENT:  Head: Normocephalic and atraumatic.  Nose: Nose normal.  Mouth/Throat: No oropharyngeal exudate.  Eyes: Conjunctivae and EOM are normal. Pupils are equal, round, and reactive to light. No scleral icterus.  Neck: Normal range of motion. Neck supple. No JVD present. No tracheal deviation present. No thyromegaly present.  Cardiovascular: Normal rate, regular rhythm and intact distal pulses.  Exam reveals no gallop.   Murmur heard. II/VI systolic murmur at LSB  Respiratory: Effort normal and breath sounds normal. No respiratory distress. He has no wheezes. He has no rales.  GI: Soft. Bowel sounds are normal. He exhibits no distension. There is no tenderness.  Musculoskeletal: Normal range of motion. He exhibits no edema and no  tenderness.  Neurological: He is alert and oriented to person, place, and time. No cranial nerve deficit. Coordination normal.  Skin: Skin is warm and dry. No rash noted. No erythema.  Psychiatric: He has a normal mood and affect. His behavior is normal. Judgment and thought content normal.    Labs reviewed; na 140, K 3.6, bun/cr 24/1.0 Wbc 6.2, h/h 13.5/37.8, plt 143 Chest x-ray: prior CABG, some chronic changes, no overt edema/infiltrate/volume Troponin 0.00 6/14 echo results reviewed; RVSP 46 mmHg, EF 50-55%, severe dilated LA  Problem List Chest Pain Fairly recent redo CABG 10/18/12 Hypertension Known Coronary Artery Disease Gout Hypothyroidism Severe Claudication Previous Post-operative atrial fibrillation  Assessment/Plan 79 yo man with PMH of CAD, prior CABG in '95 and redo CABG of SVG to RCA off pump 10/18/12, hypothyroidism, hypertension, severe claudication here with chest pain. Differential is musculoskeletal, GERD, PUD, angina, unstable angina, NSTEMI. ECG looks stable without overt ischemic changes. I favor a diagnosis of exertional ischemic pain and/or subendocardial ischemia given his known significant native disease; however, I do not think it is clear he has had any plaque rupture given initial troponin of 0.00 and stable ECG. He did overdo things physically today. We will trend cardiac enyzmes, add on heparin gtt and/or NTG gtt if pain were to recur (currently chest pain free for some time) and based on enyzmes and clinical picture overnight consider stress testing vs. Cath review and/or LHC (less likely) in AM.  - telemetry, observation admission - trend cardiac markers - update lipid panel - continue home medications - NPO -  low threshold to start heparin and/or NTG gtt for recurrent pain    Haroun Cotham 01/26/2013, 2:22 AM

## 2013-01-26 NOTE — Progress Notes (Signed)
Subjective:  No recurrent pain overnight. No shortness of breath.  Objective:  Vital Signs in the last 24 hours: BP 142/55  Pulse 55  Temp(Src) 98.2 F (36.8 C) (Oral)  Resp 20  Ht 5\' 11"  (1.803 m)  Wt 76.431 kg (168 lb 8 oz)  BMI 23.51 kg/m2  SpO2 96%  Physical Exam: Mildly anxious white male in no acute distress Lungs:  Clear Cardiac:  Regular rhythm, normal S1 and S2, no S3 Abdomen:  Soft, nontender, no masses Extremities:  No edema present    Weight Filed Weights   01/26/13 0442  Weight: 76.431 kg (168 lb 8 oz)    Lab Results: Basic Metabolic Panel:  Recent Labs  40/98/11 2325  NA 140  K 3.6  CL 106  CO2 22  GLUCOSE 136*  BUN 24*  CREATININE 1.03   CBC:  Recent Labs  01/25/13 2325  WBC 6.2  HGB 13.5  HCT 37.8*  MCV 88.5  PLT 143*   Cardiac Enzymes:  Recent Labs  01/26/13 0700  TROPONINI <0.30    Telemetry: Sinus rhythm  Assessment/Plan:  1. Recurrent isolated episode of chest discomfort occurring at rest last evening in a patient with recent redo bypass grafting. His enzymes are negative and his EKG is unremarkable. It's really unclear whether this represents a problem with the graft he was recently placed or whether he could just had an episode of angina due to all the activity yesterday. 2. Recent redo coronary bypass grafting 3. Peripheral vascular disease with claudication  Recommendations:  I would favor letting him eat and walk around and will review his previous catheterization films today. I would favor continued medical therapy unless he has recurrent symptoms.     Darden Palmer  MD [facility]  01/26/2013, 9:04 AM

## 2013-01-26 NOTE — Progress Notes (Signed)
Utilization review completed.  

## 2013-01-26 NOTE — ED Notes (Signed)
Pt states that he is having anginal pain. The pain started around 10p. Pt took two NTG tablets with relief. Pt states that he has anginal pain history but hasn't had any anginal pain since his bypass surgery in June.

## 2013-01-27 NOTE — Discharge Summary (Signed)
Physician Discharge Summary  Patient ID: Ian Cummings. MRN: [id number] DOB/AGE: 77-07-1928 77 y.o.  Admit date: 01/26/2013 Discharge date: 01/27/2013  Primary Physician:  Dr. Buren Kos  Primary Discharge Diagnosis:  1. Prolonged chest discomfort consistent with angina pectoris resolved  Secondary Discharge Diagnosis: 2. Coronary artery disease with recent redo bypass grafting 3. Peripheral vascular disease with claudication 4. Hypertension 5. Hyperlipidemia 6. Hypothyroidism  Hospital Course: This 79 year old male has a history of extensive vascular disease including coronary disease with redo bypass grafting just in June and been doing quite well since then. He is limited with claudication. The day of admission and his wife is out of town at [employer] and he worked harder than he has today since having surgery in June. He cleaned and scrubbed his son's home and has home day of admission had a martini earlier and then around 10 PM had left upper abdominal pain that moved into his left shoulder and neck similar to the pain he said that led to his bypass and redo bypass surgery. He took 2 nitroglycerin at home and the pain recurred and he then presented to the emergency room and was pain-free after receiving additional nitroglycerin.  The patient was observed overnight and had no recurrence of that similar pain. He had a vague ache in his left upper abdominal area the entire next day. All of his cardiac enzymes were negative. After review of the most recent catheterization and his clinical course it was elected to discharge him on his continued medications with continued observation. He has recurrent pain then we will need to move ahead the catheterization. It was not felt that there would be a role for stress testing here at this time.  Discharge Exam: Blood pressure 139/52, pulse 48, temperature 97.5 F (36.4 C), temperature source Oral, resp. rate 18, height 5\' 11"  (1.803 m),  weight 76.431 kg (168 lb 8 oz), SpO2 98.00%.   Lungs clear, no S3  Labs: CBC:   Lab Results  Component Value Date   WBC 6.2 01/25/2013   HGB 13.5 01/25/2013   HCT 37.8* 01/25/2013   MCV 88.5 01/25/2013   PLT 143* 01/25/2013    CMP:  Recent Labs Lab 01/25/13 2325  NA 140  K 3.6  CL 106  CO2 22  BUN 24*  CREATININE 1.03  CALCIUM 8.8  GLUCOSE 136*    Lipid Panel     Component Value Date/Time   CHOL  Value: 109        ATP III CLASSIFICATION:  <200     mg/dL   Desirable  409-811  mg/dL   Borderline High  >=914    mg/dL   High        78/29/5621 0241   TRIG 114 04/18/2009 0241   HDL 34* 04/18/2009 0241   CHOLHDL 3.2 04/18/2009 0241   VLDL 23 04/18/2009 0241   LDLCALC  Value: 52        Total Cholesterol/HDL:CHD Risk Coronary Heart Disease Risk Table                     Men   Women  1/2 Average Risk   3.4   3.3  Average Risk       5.0   4.4  2 X Average Risk   9.6   7.1  3 X Average Risk  23.4   11.0        Use the calculated Patient Ratio above and the CHD Risk  Table to determine the patient's CHD Risk.        ATP III CLASSIFICATION (LDL):  <100     mg/dL   Optimal  161-096  mg/dL   Near or Above                    Optimal  130-159  mg/dL   Borderline  045-409  mg/dL   High  >811     mg/dL   Very High 91/47/8295 0241    Cardiac Enzymes:  Recent Labs  01/26/13 0700 01/26/13 1135  TROPONINI <0.30 <0.30    BNP (last 3 results)  Recent Labs  08/16/12 1829  PROBNP 351.1    Protime: No components found with this basename: PT,   Thyroid: Lab Results  Component Value Date   TSH 5.915* 10/21/2012    Hemoglobin A1C: Lab Results  Component Value Date   HGBA1C 5.1 10/15/2012     Radiology: Chronic lung changes with no acute abnormality  EKG: Sinus rhythm, no ischemic ST changes  Discharge Medications:   Medication List         allopurinol 300 MG tablet  Commonly known as:  ZYLOPRIM  Take 300 mg by mouth daily.     amiodarone 200 MG tablet  Commonly known  as:  PACERONE  Take 200 mg by mouth daily.     amLODipine 10 MG tablet  Commonly known as:  NORVASC  Take 10 mg by mouth daily.     aspirin 81 MG tablet  Take 81 mg by mouth daily.     atenolol 50 MG tablet  Commonly known as:  TENORMIN  Take 50 mg by mouth daily.     cholecalciferol 1000 UNITS tablet  Commonly known as:  VITAMIN D  Take 2,000 Units by mouth daily.     clopidogrel 75 MG tablet  Commonly known as:  PLAVIX  Take 75 mg by mouth daily.     fish oil-omega-3 fatty acids 1000 MG capsule  Take 1 g by mouth daily.     lansoprazole 15 MG capsule  Commonly known as:  PREVACID  Take 15 mg by mouth daily.     levothyroxine 25 MCG tablet  Commonly known as:  SYNTHROID, LEVOTHROID  Take 1 tablet (25 mcg total) by mouth daily before breakfast.     multivitamin Tabs tablet  Take 1 tablet by mouth daily.     PARoxetine 20 MG tablet  Commonly known as:  PAXIL  Take 20 mg by mouth every morning.     ramipril 2.5 MG tablet  Commonly known as:  ALTACE  Take 2.5 mg by mouth 2 (two) times daily.     rosuvastatin 40 MG tablet  Commonly known as:  CRESTOR  Take 40 mg by mouth daily.     vitamin B-12 1000 MCG tablet  Commonly known as:  CYANOCOBALAMIN  Take 1,000 mcg by mouth daily.         Followup plans and appointments: See Dr. Donnie Aho early next week. Report any worsening chest discomfort  Time spent with patient to include physician time: 30 minutes   Signed: Darden Palmer. MD [facility] 01/27/2013, 9:14 AM

## 2013-01-27 NOTE — Progress Notes (Signed)
Pt provided with dc instructions and educatino. No questinos at this time. Heart monitor cleaned and returned to front. IV removed with tip intact. Pt dc to home. Levonne Spiller, RN

## 2013-01-28 ENCOUNTER — Ambulatory Visit (HOSPITAL_COMMUNITY): Payer: Medicare HMO

## 2013-01-31 ENCOUNTER — Other Ambulatory Visit: Payer: Self-pay | Admitting: Cardiology

## 2013-01-31 ENCOUNTER — Ambulatory Visit (HOSPITAL_COMMUNITY): Payer: Medicare HMO

## 2013-01-31 NOTE — H&P (Signed)
Ian Cummings, Ian Cummings  Date of visit:  01/31/2013 DOB:  03/23/1929    Age:  79 yrs. Medical record number:  [id number]     Account number:  [id number] Primary Care Provider: SHAW, DOUGLAS ____________________________ CURRENT DIAGNOSES  1. CAD. Bypass graft  2. CAD,Native  3. Hyperlipidemia  4. Carotid Artery Stenosis  5. Peripheral Vascular Disease  6. Surgery-Aortocoronary Bypass Grafting  7. Chest Pain ____________________________ ALLERGIES  Metoprolol, Rash, itching ____________________________ MEDICATIONS  1. multivitamin tablet, 1 p.o. daily  2. Vitamin D3 1,000 unit tablet, 1 p.o. daily  3. Crestor 40 mg tablet, 1 p.o. daily  4. clopidogrel 75 mg tablet, 1 p.o. daily  5. lansoprazole 15 mg tablet,disintegrat, delay rel, 1 p.o. daily  6. Fish Oil 340-1,000 mg capsule, 1 p.o. daily  7. levothyroxine 25 mcg tablet, 1 p.o. daily  8. paroxetine HCl 20 mg tablet, 1 p.o. daily  9. Vitamin Cummings-12 1,000 mcg tablet, 1 p.o. daily  10. aspirin 81 mg tablet,chewable, 1 p.o. daily  11. herbal drugs tablet, placebo or Evacetrapib  1 qd        DRUG STUDY  12. atenolol 50 mg tablet, 1 p.o. daily  13. ramipril 2.5 mg capsule, BID  14. allopurinol 100 mg tablet, 1 p.o. daily  15. amlodipine 2.5 mg tablet, 1 p.o. daily  16. amiodarone 200 mg tablet, 1 p.o. daily ____________________________ CHIEF COMPLAINTS  Followup of CAD,Native ____________________________ HISTORY OF PRESENT ILLNESS  Patient seen following a recent hospitalization. He was admitted with prolonged left-sided chest pain that radiated up into his neck and left arm but is different than his previous angina. The discomfort occurred at rest one evening and he came to the hospital after taking multiple nitroglycerin. Enzymes and EKG were negative and the next day he had some discomfort most the day but it gradually went away and he went home. Because the pain was atypical I opted to send him home to see whether he would continue to have pain  or not. He has continued to have chest discomfort that would typically occur after he gets up and moves around. He will take as many as 3 nitroglycerin may notice some relief. The discomfort is different than the previous angina that he had before surgery. He has been limiting his activities and it is very difficult to tell whether this is anginal pain or not. It is nowhere as severe as it was prior to coming into the hospital ____________________________ PAST HISTORY  Past Medical Illnesses:  hyperlipidemia, claudication-BLE, BPH, hypertension, gout, GERD;  Cardiovascular Illnesses:  CAD, peripheral vascular disease, Carotid artery disease;  Surgical Procedures:  CABG, carotid stent, inguinal herniorrhaphy-rt, inguinal herniorrhaphy-left, lt foot surgery, cataract extraction OU, tonsillectomy;  Cardiology Procedures-Invasive:  CABG w LIMA to LAD, SVG to OM1-OM2, SVG to dx, SVG to PD/PL 1995, Cypher stent x 2 SVG to RCA and Cypher stent to Circ Graft 05/13/07, stent of SVG to circ OM in  [place] in July 2013, redo CABG off pump Dr. Gerhardt 10/18/12;  Cardiology Procedures-Noninvasive:  echocardiogram, treadmill cardiolite;  Cardiac Cath Results:  normal Left main, occluded mid LAD, occluded RCA SVG, occluded CFX, widely patent OM 1 SVG, occluded RCA SVG, occluded Diag 1 SVG, widely patent LAD LIMA graft;  Peripheral Vascular Procedures:  carotid stent right 2013 [facility];  LVEF of 55% documented via echocardiogram on 10/14/2012,   ____________________________ CARDIO-PULMONARY TEST DATES EKG Date:  10/26/2012;   Cardiac Cath Date:  08/09/2012;  CABG: 08/07/1993;  Stent Placement Date: 11/02/2011;    Echocardiography Date: 10/14/2012;  Chest Xray Date: 09/06/2012;   ____________________________ FAMILY HISTORY Brother -- Brother alive and well Father -- Myocardial infarction, Father dead Mother -- Unknown Disease, Mother dead Sister -- CVA, Sister dead Sister --  Hypertension ____________________________ SOCIAL HISTORY Alcohol Use:  wine 1-2 per day;  Smoking:  used to smoke but quit Prior to 1980;  Diet:  low sugar;  Lifestyle:  married;  Exercise:  some exercise;  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  Integumentary:no rashes or new skin lesions. Eyes: denies diplopia, history of glaucoma or visual problems. Ears, Nose, Throat, Mouth:  denies any hearing loss, epistaxis, hoarseness or difficulty speaking. Respiratory: denies dyspnea, cough, wheezing or hemoptysis. Cardiovascular:  please review HPI Abdominal: denies dyspepsia, GI bleeding, constipation, or diarrhea Genitourinary-Male: nocturia, erectile dysfunction  Musculoskeletal:  arthritis of the hands Neurological:  denies headaches, stroke, or TIA  ____________________________ PHYSICAL EXAMINATION VITAL SIGNS  Blood Pressure:  130/70 Sitting, Right arm, regular cuff  , 134/72 Standing, Right arm and regular cuff   Pulse:  60/min. Weight:  168.00 lbs. Height:  71"BMI: 23  Constitutional:  pleasant white male in no acute distress Skin:  warm and dry to touch, no apparent skin lesions, or masses noted. Head:  normocephalic, normal hair pattern, no masses or tenderness Eyes:  EOMS Intact, PERRLA, C and S clear, Funduscopic exam not done. ENT:  ears, nose and throat reveal no gross abnormalities.  Dentition good. Neck:  supple, without massess. No JVD, thyromegaly or carotid bruits. Carotid upstroke normal. Chest:  clear to auscultation and percussion, healed median sternotomy scar Cardiac:  regular rhythm, normal S1 and S2, No S3 or S4, no murmurs, gallops or rubs detected. Abdomen:  abdomen soft,non-tender, no masses, no hepatospenomegaly, or aneurysm noted Peripheral Pulses:  femoral pulses 2+, bilateral femoral bruits present,  distal pulses diminished Extremities & Back:  well healed saphenous vein donor site RLE, no edema present Neurological:  no gross motor or sensory deficits noted, affect appropriate, oriented x3. ____________________________ MOST RECENT LIPID PANEL 01/25/13  CHOL TOTL 109 mg/dl, LDL 52 calc, HDL 34 mg/dl, TRIGLYCER 114 mg/dl and CHOL/HDL 3.2 (Calc) ____________________________ IMPRESSIONS/PLAN  1. Recurrent chest pain following a redo bypass grafting in June 2. History of severe coronary artery disease with redo bypass grafting and stenting of vein grafts 3. Peripheral vascular disease with claudication 4. Hypertensive heart disease 5. Hyperlipidemia  Recommendations:  Very difficult to evaluate but has had ongoing chest pain since going home. It's hard to tell whether this is angina or something else. I don't think that an exercise study will help us in this situation. I recommended because of recurrent chest pain despite optimal medical therapy to proceed with catheterization to determine if he has a problem with the recently placed graft or the previously placed stents that were in the vein grafts before.  Cardiac catheterization was discussed with the patient including risks of myocardial infarction, death, stroke, bleeding, arrhythmia, dye allergy, or renal insufficiency. He understands and is willing to proceed.  ____________________________ TODAYS ORDERS  1. Left Heart Cath/Poss PCI: First Available                       ____________________________ Cardiology Physician:  W. Spencer Tilley, Jr. MD [facility]     

## 2013-02-02 ENCOUNTER — Encounter (HOSPITAL_COMMUNITY)
Admission: RE | Disposition: A | Discharge status: Home / Self Care | Payer: Self-pay | Source: Ambulatory Visit | Attending: Cardiology

## 2013-02-02 ENCOUNTER — Ambulatory Visit (HOSPITAL_COMMUNITY): Payer: Medicare HMO

## 2013-02-02 ENCOUNTER — Ambulatory Visit (HOSPITAL_COMMUNITY)
Admission: RE | Admit: 2013-02-02 | Discharge: 2013-02-02 | Disposition: A | Discharge status: Home / Self Care | Payer: Medicare HMO | Source: Ambulatory Visit | Attending: Cardiology | Admitting: Cardiology

## 2013-02-02 DIAGNOSIS — I10 Essential (primary) hypertension: Secondary | ICD-10-CM | POA: Insufficient documentation

## 2013-02-02 DIAGNOSIS — I2510 Atherosclerotic heart disease of native coronary artery without angina pectoris: Secondary | ICD-10-CM | POA: Insufficient documentation

## 2013-02-02 DIAGNOSIS — I25810 Atherosclerosis of coronary artery bypass graft(s) without angina pectoris: Secondary | ICD-10-CM | POA: Insufficient documentation

## 2013-02-02 DIAGNOSIS — I200 Unstable angina: Secondary | ICD-10-CM | POA: Insufficient documentation

## 2013-02-02 DIAGNOSIS — E785 Hyperlipidemia, unspecified: Secondary | ICD-10-CM | POA: Insufficient documentation

## 2013-02-02 DIAGNOSIS — I6529 Occlusion and stenosis of unspecified carotid artery: Secondary | ICD-10-CM | POA: Insufficient documentation

## 2013-02-02 DIAGNOSIS — R079 Chest pain, unspecified: Secondary | ICD-10-CM | POA: Insufficient documentation

## 2013-02-02 DIAGNOSIS — I739 Peripheral vascular disease, unspecified: Secondary | ICD-10-CM | POA: Insufficient documentation

## 2013-02-02 HISTORY — PX: LEFT HEART CATHETERIZATION WITH CORONARY/GRAFT ANGIOGRAM: SHX5450

## 2013-02-02 LAB — PROTIME-INR: INR: 0.95 (ref 0.00–1.49)

## 2013-02-02 SURGERY — LEFT HEART CATHETERIZATION WITH CORONARY/GRAFT ANGIOGRAM
Anesthesia: LOCAL

## 2013-02-02 MED ORDER — DIAZEPAM 5 MG PO TABS
5.0000 mg | ORAL_TABLET | ORAL | Status: AC
  Administered 2013-02-02: 5 mg via ORAL

## 2013-02-02 MED ORDER — ACETAMINOPHEN 325 MG PO TABS: 650.0000 mg | ORAL_TABLET | ORAL | Status: DC | PRN

## 2013-02-02 MED ORDER — METOPROLOL TARTRATE 1 MG/ML IV SOLN
INTRAVENOUS | Status: AC
  Filled 2013-02-02: qty 5

## 2013-02-02 MED ORDER — SODIUM CHLORIDE 0.9 % IV SOLN
INTRAVENOUS | Status: DC
  Administered 2013-02-02: 11:00:00 via INTRAVENOUS

## 2013-02-02 MED ORDER — SODIUM CHLORIDE 0.9 % IV SOLN: 1.0000 mL/kg/h | INTRAVENOUS | Status: DC

## 2013-02-02 MED ORDER — ASPIRIN 81 MG PO CHEW
81.0000 mg | CHEWABLE_TABLET | ORAL | Status: AC
  Administered 2013-02-02: 81 mg via ORAL

## 2013-02-02 MED ORDER — HEPARIN (PORCINE) IN NACL 2-0.9 UNIT/ML-% IJ SOLN
INTRAMUSCULAR | Status: AC
  Filled 2013-02-02: qty 1500

## 2013-02-02 MED ORDER — NITROGLYCERIN 0.2 MG/ML ON CALL CATH LAB
INTRAVENOUS | Status: AC
  Filled 2013-02-02: qty 1

## 2013-02-02 MED ORDER — LIDOCAINE HCL (PF) 1 % IJ SOLN
INTRAMUSCULAR | Status: AC
  Filled 2013-02-02: qty 30

## 2013-02-02 MED ORDER — LABETALOL HCL 5 MG/ML IV SOLN
INTRAVENOUS | Status: AC
  Filled 2013-02-02: qty 4

## 2013-02-02 NOTE — CV Procedure (Signed)
CARDIAC CATHETERIZATION REPORT   Ian Cummings.  DOB/Age:  77-27-1930  79 y.o. male  MRN: [id number]  Today's date: 02/02/2013   PROCEDURE:  Left heart catheterization with selective coronary angiography, bypass graft angiograms, and left ventriculogram.  INDICATIONS:  Unstable angina pectoris in a patient with recent bypass grafting.  The risks, benefits, and details of the procedure were explained to the patient.  The patient verbalized understanding and wanted to proceed.  Informed written consent was obtained.  PROCEDURE TECHNIQUE:  After Xylocaine anesthesia a 46F sheath was placed in the right femoral artery with a single anterior needle wall stick. There was atherosclerosis distally in the aorta and required a Wholey wire to navigate. The procedure was done over an exchange wire following this.  Left coronary angiography was done using a Judkins L4 guide catheter.  Right coronary angiography was done using a Judkins R4 guide catheter. Bypass angiograms were made with the JR4 catheter. The LIMA was selected with an internal mammary bypass catheter Left ventriculography was done using a pigtail catheter. Labetalol 20 mg was administered for hypertension at the end of the procedure. The patient tolerated the procedure well and was returned to the holding area for sheath removal.   CONTRAST:  80 cc.  COMPLICATIONS:  None.    HEMODYNAMICS:  Aortic post contrast 172/61,  left ventricle postcontrast 172/10-16. There was no gradient between the left ventricle and aorta.    ANGIOGRAPHIC DATA:    CORONARY ARTERIES:   Arise and distribute normally.  Right dominant. Moderate coronary calcification is noted.  Left main coronary artery: Normal.  Left anterior descending: Occluded in the midportion after giving off a septal perforator and 2 diagonal branches.  A retrograde stump of a bypass graft is noted coming off of the diagonal.  Circumflex coronary  artery: Occluded proximally  Right coronary artery: Subtotally occluded in the midportion fills by bridging collaterals. A small posterior descending is seen to fills both antegrade as well as by collaterals through the mammary artery  SVG to RCA: Is widely patent with a patent proximal and distal anastomotic site. The distal right coronary artery appears to have a stenosis at the origin of the posterior lateral branch estimated at 90%.  SVG to diag:  Occluded  SVG to OM: Widely patent. The proximal stent is patent followed by an area of 30-40% stenosis within the native vein graft, 2 distal stents are patent  LIMA to LAD: Widely patent with a patent distal anastomotic site. Collateral filling is seen of a distal posterior descending artery  LEFT VENTRICULOGRAM:  Performed in the 30 RAO projection.  The aortic and mitral valves are normal. The left ventricle is normal in size with normal wall motion. Estimated ejection fraction is 50%.  IMPRESSIONS:  1. Severe native three-vessel coronary artery disease 2. Widely patent vein graft to the right coronary artery, widely patent internal mammary graft to the LAD, and widely patent saphenous and graft to the marginal branch with occlusion of the vein graft to the diagonal 3. Normal left ventricle.  RECOMMENDATION: Continue medical therapy at this time. The chest pain may be noncardiac although he does have potential sites of ischemia in the distal posterior descending artery and some very faint collaterals to a second marginal branch are noted. He should be managed medically and may be reassured at this time.  Darden Palmer MD [facility]

## 2013-02-02 NOTE — Interval H&P Note (Signed)
Cath Lab Visit (complete for each Cath Lab visit)  Clinical Evaluation Leading to the Procedure:   ACS: no  Non-ACS:    Anginal Classification: CCS III  Anti-ischemic medical therapy: Maximal Therapy (2 or more classes of medications)  Non-Invasive Test Results: No non-invasive testing performed  Prior CABG: Previous CABG      History and Physical Interval Note:  02/02/2013 11:49 AM  Ian Cummings.  has presented today for surgery, with the diagnosis of Chest pain  The various methods of treatment have been discussed with the patient and family. After consideration of risks, benefits and other options for treatment, the patient has consented to  Procedure(s): LEFT HEART CATHETERIZATION WITH CORONARY/GRAFT ANGIOGRAM (N/A) as a surgical intervention .  The patient's history has been reviewed, patient examined, no change in status, stable for surgery.  I have reviewed the patient's chart and labs.  Questions were answered to the patient's satisfaction.     TILLEY JR,W SPENCER

## 2013-02-02 NOTE — H&P (View-Only) (Signed)
Ian Cummings  Date of visit:  01/31/2013 DOB:  [birth year]    Age:  79 yrs. Medical record number:  [id number]     Account number:  [id number] Primary Care Provider: Buren Kos ____________________________ CURRENT DIAGNOSES  1. CAD. Bypass graft  2. CAD,Native  3. Hyperlipidemia  4. Carotid Artery Stenosis  5. Peripheral Vascular Disease  6. Surgery-Aortocoronary Bypass Grafting  7. Chest Pain ____________________________ ALLERGIES  Metoprolol, Rash, itching ____________________________ MEDICATIONS  1. multivitamin tablet, 1 p.o. daily  2. Vitamin D3 1,000 unit tablet, 1 p.o. daily  3. Crestor 40 mg tablet, 1 p.o. daily  4. clopidogrel 75 mg tablet, 1 p.o. daily  5. lansoprazole 15 mg tablet,disintegrat, delay rel, 1 p.o. daily  6. Fish Oil 340-1,000 mg capsule, 1 p.o. daily  7. levothyroxine 25 mcg tablet, 1 p.o. daily  8. paroxetine HCl 20 mg tablet, 1 p.o. daily  9. Vitamin Cummings-12 1,000 mcg tablet, 1 p.o. daily  10. aspirin 81 mg tablet,chewable, 1 p.o. daily  11. herbal drugs tablet, placebo or Evacetrapib  1 qd        DRUG STUDY  12. atenolol 50 mg tablet, 1 p.o. daily  13. ramipril 2.5 mg capsule, BID  14. allopurinol 100 mg tablet, 1 p.o. daily  15. amlodipine 2.5 mg tablet, 1 p.o. daily  16. amiodarone 200 mg tablet, 1 p.o. daily ____________________________ CHIEF COMPLAINTS  Followup of CAD,Native ____________________________ HISTORY OF PRESENT ILLNESS  Patient seen following a recent hospitalization. He was admitted with prolonged left-sided chest pain that radiated up into his neck and left arm but is different than his previous angina. The discomfort occurred at rest one evening and he came to the hospital after taking multiple nitroglycerin. Enzymes and EKG were negative and the next day he had some discomfort most the day but it gradually went away and he went home. Because the pain was atypical I opted to send him home to see whether he would continue to have pain  or not. He has continued to have chest discomfort that would typically occur after he gets up and moves around. He will take as many as 3 nitroglycerin may notice some relief. The discomfort is different than the previous angina that he had before surgery. He has been limiting his activities and it is very difficult to tell whether this is anginal pain or not. It is nowhere as severe as it was prior to coming into the hospital ____________________________ PAST HISTORY  Past Medical Illnesses:  hyperlipidemia, claudication-BLE, BPH, hypertension, gout, GERD;  Cardiovascular Illnesses:  CAD, peripheral vascular disease, Carotid artery disease;  Surgical Procedures:  CABG, carotid stent, inguinal herniorrhaphy-rt, inguinal herniorrhaphy-left, lt foot surgery, cataract extraction OU, tonsillectomy;  Cardiology Procedures-Invasive:  CABG w LIMA to LAD, SVG to OM1-OM2, SVG to dx, SVG to PD/PL 1995, Cypher stent x 2 SVG to RCA and Cypher stent to Circ Graft 05/13/07, stent of SVG to circ OM in  [place] in July 2013, redo CABG off pump Dr. Tyrone Sage 10/18/12;  Cardiology Procedures-Noninvasive:  echocardiogram, treadmill cardiolite;  Cardiac Cath Results:  normal Left main, occluded mid LAD, occluded RCA SVG, occluded CFX, widely patent OM 1 SVG, occluded RCA SVG, occluded Diag 1 SVG, widely patent LAD LIMA graft;  Peripheral Vascular Procedures:  carotid stent right 2013 [facility];  LVEF of 55% documented via echocardiogram on 10/14/2012,   ____________________________ Jacqulyn Bath TEST DATES EKG Date:  10/26/2012;   Cardiac Cath Date:  08/09/2012;  CABG: 08/07/1993;  Stent Placement Date: 11/02/2011;  Echocardiography Date: 10/14/2012;  Chest Xray Date: 09/06/2012;   ____________________________ FAMILY HISTORY Brother -- Brother alive and well Father -- Myocardial infarction, Father dead Mother -- Unknown Disease, Mother dead Sister -- CVA, Sister dead Sister --  Hypertension ____________________________ SOCIAL HISTORY Alcohol Use:  wine 1-2 per day;  Smoking:  used to smoke but quit Prior to 1980;  Diet:  low sugar;  Lifestyle:  married;  Exercise:  some exercise;  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  Integumentary:no rashes or new skin lesions. Eyes: denies diplopia, history of glaucoma or visual problems. Ears, Nose, Throat, Mouth:  denies any hearing loss, epistaxis, hoarseness or difficulty speaking. Respiratory: denies dyspnea, cough, wheezing or hemoptysis. Cardiovascular:  please review HPI Abdominal: denies dyspepsia, GI bleeding, constipation, or diarrhea Genitourinary-Male: nocturia, erectile dysfunction  Musculoskeletal:  arthritis of the hands Neurological:  denies headaches, stroke, or TIA  ____________________________ PHYSICAL EXAMINATION VITAL SIGNS  Blood Pressure:  130/70 Sitting, Right arm, regular cuff  , 134/72 Standing, Right arm and regular cuff   Pulse:  60/min. Weight:  168.00 lbs. Height:  71"BMI: 23  Constitutional:  pleasant white male in no acute distress Skin:  warm and dry to touch, no apparent skin lesions, or masses noted. Head:  normocephalic, normal hair pattern, no masses or tenderness Eyes:  EOMS Intact, PERRLA, C and S clear, Funduscopic exam not done. ENT:  ears, nose and throat reveal no gross abnormalities.  Dentition good. Neck:  supple, without massess. No JVD, thyromegaly or carotid bruits. Carotid upstroke normal. Chest:  clear to auscultation and percussion, healed median sternotomy scar Cardiac:  regular rhythm, normal S1 and S2, No S3 or S4, no murmurs, gallops or rubs detected. Abdomen:  abdomen soft,non-tender, no masses, no hepatospenomegaly, or aneurysm noted Peripheral Pulses:  femoral pulses 2+, bilateral femoral bruits present,  distal pulses diminished Extremities & Back:  well healed saphenous vein donor site RLE, no edema present Neurological:  no gross motor or sensory deficits noted, affect appropriate, oriented x3. ____________________________ MOST RECENT LIPID PANEL 01/25/13  CHOL TOTL 109 mg/dl, LDL 52 calc, HDL 34 mg/dl, TRIGLYCER 161 mg/dl and CHOL/HDL 3.2 (Calc) ____________________________ IMPRESSIONS/PLAN  1. Recurrent chest pain following a redo bypass grafting in June 2. History of severe coronary artery disease with redo bypass grafting and stenting of vein grafts 3. Peripheral vascular disease with claudication 4. Hypertensive heart disease 5. Hyperlipidemia  Recommendations:  Very difficult to evaluate but has had ongoing chest pain since going home. It's hard to tell whether this is angina or something else. I don't think that an exercise study will help [place] in this situation. I recommended because of recurrent chest pain despite optimal medical therapy to proceed with catheterization to determine if he has a problem with the recently placed graft or the previously placed stents that were in the vein grafts before.  Cardiac catheterization was discussed with the patient including risks of myocardial infarction, death, stroke, bleeding, arrhythmia, dye allergy, or renal insufficiency. He understands and is willing to proceed.  ____________________________ TODAYS ORDERS  1. Left Heart Cath/Poss PCI: First Available                       ____________________________ Cardiology Physician:  Darden Palmer MD [facility]

## 2013-02-04 ENCOUNTER — Ambulatory Visit (HOSPITAL_COMMUNITY): Payer: Medicare HMO

## 2013-02-07 ENCOUNTER — Ambulatory Visit (HOSPITAL_COMMUNITY): Payer: Medicare HMO

## 2013-02-09 ENCOUNTER — Ambulatory Visit (HOSPITAL_COMMUNITY): Payer: Medicare HMO

## 2013-02-11 ENCOUNTER — Ambulatory Visit (HOSPITAL_COMMUNITY): Payer: Medicare HMO

## 2013-02-14 ENCOUNTER — Ambulatory Visit (HOSPITAL_COMMUNITY): Payer: Medicare HMO

## 2013-02-16 ENCOUNTER — Ambulatory Visit (HOSPITAL_COMMUNITY): Payer: Medicare HMO

## 2013-02-18 ENCOUNTER — Ambulatory Visit (HOSPITAL_COMMUNITY): Payer: Medicare HMO

## 2013-02-21 ENCOUNTER — Ambulatory Visit (HOSPITAL_COMMUNITY): Payer: Medicare HMO

## 2013-02-23 ENCOUNTER — Ambulatory Visit (HOSPITAL_COMMUNITY): Payer: Medicare HMO

## 2013-02-25 ENCOUNTER — Ambulatory Visit (HOSPITAL_COMMUNITY): Payer: Medicare HMO

## 2013-02-28 ENCOUNTER — Ambulatory Visit (HOSPITAL_COMMUNITY): Payer: Medicare HMO

## 2013-03-02 ENCOUNTER — Ambulatory Visit (HOSPITAL_COMMUNITY): Payer: Medicare HMO

## 2013-03-04 ENCOUNTER — Ambulatory Visit (HOSPITAL_COMMUNITY): Payer: Medicare HMO

## 2013-06-21 ENCOUNTER — Encounter: Payer: Self-pay | Admitting: Family

## 2013-06-22 ENCOUNTER — Ambulatory Visit (INDEPENDENT_AMBULATORY_CARE_PROVIDER_SITE_OTHER): Payer: Medicare HMO | Admitting: Family

## 2013-06-22 ENCOUNTER — Ambulatory Visit (HOSPITAL_COMMUNITY)
Admission: RE | Admit: 2013-06-22 | Discharge: 2013-06-22 | Disposition: A | Discharge status: Home / Self Care | Payer: Medicare HMO | Source: Ambulatory Visit | Attending: Family | Admitting: Family

## 2013-06-22 ENCOUNTER — Ambulatory Visit (INDEPENDENT_AMBULATORY_CARE_PROVIDER_SITE_OTHER)
Admission: RE | Admit: 2013-06-22 | Discharge: 2013-06-22 | Disposition: A | Discharge status: Home / Self Care | Payer: Medicare HMO | Source: Ambulatory Visit | Attending: Surgery | Admitting: Surgery

## 2013-06-22 ENCOUNTER — Encounter: Payer: Self-pay | Admitting: Family

## 2013-06-22 VITALS — BP 121/66 | HR 47 | Resp 14 | Ht 71.0 in | Wt 164.0 lb

## 2013-06-22 DIAGNOSIS — Z48812 Encounter for surgical aftercare following surgery on the circulatory system: Secondary | ICD-10-CM

## 2013-06-22 DIAGNOSIS — I739 Peripheral vascular disease, unspecified: Secondary | ICD-10-CM

## 2013-06-22 DIAGNOSIS — I70219 Atherosclerosis of native arteries of extremities with intermittent claudication, unspecified extremity: Secondary | ICD-10-CM

## 2013-06-22 DIAGNOSIS — E785 Hyperlipidemia, unspecified: Secondary | ICD-10-CM | POA: Insufficient documentation

## 2013-06-22 DIAGNOSIS — E119 Type 2 diabetes mellitus without complications: Secondary | ICD-10-CM | POA: Insufficient documentation

## 2013-06-22 DIAGNOSIS — I10 Essential (primary) hypertension: Secondary | ICD-10-CM | POA: Insufficient documentation

## 2013-06-22 DIAGNOSIS — Z87891 Personal history of nicotine dependence: Secondary | ICD-10-CM | POA: Insufficient documentation

## 2013-06-22 NOTE — Progress Notes (Signed)
[facility] [place] HISTORY AND PHYSICAL -PAD  History of Present Illness Ian Cummings. is a 80 y.o. male patient of Dr. Trula Slade who had  CABG in 1996 x 4-5 vessels, off pump CABG x 26 September 2012 by Dr. Servando Snare. He has complaints of cramping in his thighs and calves bilaterally when he walks. He can walk 200-300 ft before he cramps in calves and thighs, relieved by rest.  He has bilateral hip pain, lateral aspects, with walking, this pain was relieved for about a week after cortisone injections.    He does not have any non healing ulcers or sores. He has also been seen by Dr. Vicente Masson at [facility] in the past.  He denies any hx of stroke, but states a couple of years ago, he did have an episode of double vision for about an hour and this resolved.  He does have a hx of right carotid stenting by Dr. Harvel Ricks at [facility] June 12, 2011. Per the pt, he states that he does have ~ 70% blockage in the left carotid artery. He still sees Dr. Harvel Ricks for carotid artery surveillance. He denies any weakness or numbness in any extremity.  His LE PAD is monitored by our practice. He is on a statin for his hypercholesterolemia. He is also on a beta blocker for his heart disease/hypertension. He is also on Plavix/ASA.  The patient denies New Medical or Surgical History.  Pt Diabetic: No Pt smoker: former smoker, quit in the 1970's   Past Medical History  Diagnosis Date  . CAD (coronary artery disease)   . Hyperlipidemia   . Hypertension   . Arthritis     Gout  . GERD (gastroesophageal reflux disease)   . MI (myocardial infarction) 4/14  . Gout   . BPH (benign prostatic hypertrophy)   . Carotid artery disease     Right carotid stent 2013  . TIA (transient ischemic attack)     Hx: of  . H/O hiatal hernia   . Neuropathy     Hx: of in B/L toes  . Stroke     Social History History  Substance Use Topics  . Smoking status: Former Smoker    Types: Cigarettes    Quit date:  04/29/1975  . Smokeless tobacco: Never Used  . Alcohol Use: 10.8 - 12 oz/week    12 Glasses of wine, 6-8 Shots of liquor per week     Comment: Occasional drink    Family History Family History  Problem Relation Age of Onset  . Heart disease Mother   . Heart attack Mother   . Hyperlipidemia Father   . Hypertension Father   . Heart disease Father   . Diabetes Father   . Heart attack Father     Past Surgical History  Procedure Laterality Date  . Coronary artery bypass graft  1995  . Foot surgery    . Coronary angioplasty with stent placement  2010  . Hernia repair      X 2   . Carotid stent insertion  2013    right at [facility]  . Tonsillectomy    . Colonoscopy      Hx: of  . Eye surgery      Hx: of left eye  . Cataract surgery      Hx: of both eyes  . Fracture surgery      Hx: of Left heel  . Coronary artery bypass graft N/A 10/18/2012    Procedure:  REDO CORONARY ARTERY BYPASS GRAFTING (CABG);  Surgeon: Grace Isaac, MD;  Location: [place];  Service: Open Heart Surgery;  Laterality: N/A;  off pump times two using endoscopically harvested left saphenous vein  . Intraoperative transesophageal echocardiogram N/A 10/18/2012    Procedure: INTRAOPERATIVE TRANSESOPHAGEAL ECHOCARDIOGRAM;  Surgeon: Grace Isaac, MD;  Location: [place];  Service: Open Heart Surgery;  Laterality: N/A;    Allergies  Allergen Reactions  . Metoprolol Itching    Current Outpatient Prescriptions  Medication Sig Dispense Refill  . allopurinol (ZYLOPRIM) 300 MG tablet Take 300 mg by mouth daily.      Marland Kitchen amiodarone (PACERONE) 200 MG tablet Take 200 mg by mouth. Take medication on Mon-Wed-Fri only      . amLODipine (NORVASC) 10 MG tablet Take 10 mg by mouth daily.      Marland Kitchen aspirin 81 MG tablet Take 81 mg by mouth daily.      Marland Kitchen atenolol (TENORMIN) 50 MG tablet Take 50 mg by mouth daily.      . cholecalciferol (VITAMIN D) 1000 UNITS tablet Take 2,000 Units by mouth daily.      . clopidogrel  (PLAVIX) 75 MG tablet Take 75 mg by mouth daily.      . fish oil-omega-3 fatty acids 1000 MG capsule Take 1 g by mouth daily.       . hydrochlorothiazide (HYDRODIURIL) 12.5 MG tablet daily.      . lansoprazole (PREVACID) 15 MG capsule Take 15 mg by mouth daily.      . lansoprazole (PREVACID) 15 MG capsule daily.      Marland Kitchen levothyroxine (SYNTHROID, LEVOTHROID) 25 MCG tablet Take 1 tablet (25 mcg total) by mouth daily before breakfast.  30 tablet  1  . multivitamin (RENA-VIT) TABS tablet Take 1 tablet by mouth daily.      Marland Kitchen PARoxetine (PAXIL) 20 MG tablet Take 20 mg by mouth every morning.      . ramipril (ALTACE) 2.5 MG tablet Take 5 mg by mouth 2 (two) times daily.       . rosuvastatin (CRESTOR) 40 MG tablet Take 40 mg by mouth daily.      . vitamin B-12 (CYANOCOBALAMIN) 1000 MCG tablet Take 1,000 mcg by mouth daily.       No current facility-administered medications for this visit.    ROS: See HPI for pertinent positives and negatives.   Physical Examination  Filed Vitals:   06/22/13 1056  BP: 121/66  Pulse: 47  Resp: 14   Filed Weights   06/22/13 1056  Weight: 164 lb (74.39 kg)   Body mass index is 22.88 kg/(m^2).  General: A&O x 3, WDWN. Gait: normal Eyes: PERRLA. Pulmonary: CTAB, without wheezes , rales or rhonchi. Cardiac: regular Rhythm, bradycardic , without detected murmur.         Carotid Bruits Left Right   Negative Negative  Aorta is not palpable. Radial pulses: are 2+ palpable and =                           VASCULAR EXAM: Extremities without ischemic changes  without Gangrene; without open wounds.  LE Pulses LEFT RIGHT       FEMORAL   palpable   palpable        POPLITEAL  not palpable   not palpable       POSTERIOR TIBIAL  not palpable   not palpable        DORSALIS PEDIS      ANTERIOR TIBIAL not palpable  not palpable        PERONEAL not  Palpable   not Palpable    Abdomen: soft, NT, no masses. Skin: no rashes, no ulcers noted. Musculoskeletal: no muscle wasting or atrophy.  Neurologic: A&O X 3; Appropriate Affect ; SENSATION: normal; MOTOR FUNCTION:  moving all extremities equally, motor strength 5/5 throughout. Speech is fluent/normal. CN 2-12 intact.    Non-Invasive Vascular Imaging: DATE: 06/22/2013  DUPLEX SCAN OF BYPASS:   LOWER EXTREMITY ARTERIAL DUPLEX EVALUATION    INDICATION: Peripheral vascular disease.    PREVIOUS INTERVENTION(S):     DUPLEX EXAM:     RIGHT  LEFT   Peak Systolic Velocity (cm/s) Ratio (if abnormal) Waveform  Peak Systolic Velocity (cm/s) Ratio (if abnormal) Waveform  249  B Common Femoral Artery 189  B   103  B Deep Femoral Artery 182  B  139  M Superficial Femoral Artery Proximal 17  M  13  M Superficial Femoral Artery Mid 24  M  Not visualized   Superficial Femoral Artery Distal 50  M  21  M Popliteal Artery 40  M  14  M Posterior Tibial Artery Dist 17  M  15  M Anterior Tibial Artery Distal 81  M  Not visualized   Peroneal Artery Distal 16  M  0.47 Today's ABI / TBI 0.69  0.50 Previous ABI / TBI (12/20/2012  ) 0.65    Waveform:    M - Monophasic       B - Biphasic       T - Triphasic  If Ankle Brachial Index (ABI) or Toe Brachial Index (TBI) performed, please see complete report   Large heterogeneous plaque noted in the right common femoral artery with minimal color flow noted throughout the superficial femoral artery. Minimal flow noted in the left superficial femoral artery. Dense and irregular plaque noted throughout the bilateral lower extremities.    Compared to the previous exam:  No significant change in comparison to the last exam on 12/20/2012.     ASSESSMENT: Ian Cummings. is a 80 y.o. male who presents with:moderate claudication symptoms. ABI's indicate severe arterial occlusive disease in the right leg and moderate in the left. Duplex of both LE's  demonstrates extensive multilevel stenosis. He takes the maximum dose of the most potent statin, also is taking Plavix and ASA. He does have a hx of right carotid stenting by Dr. Harvel Ricks at [facility] June 12, 2011. Per the pt, he states that he does have ~ 70% blockage in the left carotid artery. He still sees Dr. Harvel Ricks for carotid artery surveillance.   PLAN:  Advised graduated walking program again. I discussed in depth with the patient the nature of atherosclerosis, and emphasized the importance of maximal medical management including strict control of blood pressure, blood glucose, and lipid levels, obtaining regular exercise, and continued cessation of smoking.  The patient is aware that without maximal medical management the underlying atherosclerotic disease process will progress, limiting the benefit of any interventions.  Based on the patient's vascular studies and examination, pt will return to clinic in  6  months for ABI's and bilateral LE arterial Duplex.  The patient was given information about PAD including signs, symptoms, treatment, what symptoms should prompt the patient to seek immediate medical care, and risk reduction measures to take.  Clemon Chambers, RN, MSN, FNP-C [facility] Phone: [phone]  Clinic MD: [place]  06/22/2013 11:04 AM

## 2013-06-22 NOTE — Patient Instructions (Signed)
Peripheral Vascular Disease Peripheral Vascular Disease (PVD), also called Peripheral Arterial Disease (PAD), is a circulation problem caused by cholesterol (atherosclerotic plaque) deposits in the arteries. PVD commonly occurs in the lower extremities (legs) but it can occur in other areas of the body, such as your arms. The cholesterol buildup in the arteries reduces blood flow which can cause pain and other serious problems. The presence of PVD can place a person at risk for Coronary Artery Disease (CAD).  CAUSES  Causes of PVD can be many. It is usually associated with more than one risk factor such as:   High Cholesterol.  Smoking.  Diabetes.  Lack of exercise or inactivity.  High blood pressure (hypertension).  Obesity.  Family history. SYMPTOMS   When the lower extremities are affected, patients with PVD may experience:  Leg pain with exertion or physical activity. This is called INTERMITTENT CLAUDICATION. This may present as cramping or numbness with physical activity. The location of the pain is associated with the level of blockage. For example, blockage at the abdominal level (distal abdominal aorta) may result in buttock or hip pain. Lower leg arterial blockage may result in calf pain.  As PVD becomes more severe, pain can develop with less physical activity.  In people with severe PVD, leg pain may occur at rest.  Other PVD signs and symptoms:  Leg numbness or weakness.  Coldness in the affected leg or foot, especially when compared to the other leg.  A change in leg color.  Patients with significant PVD are more prone to ulcers or sores on toes, feet or legs. These may take longer to heal or may reoccur. The ulcers or sores can become infected.  If signs and symptoms of PVD are ignored, gangrene may occur. This can result in the loss of toes or loss of an entire limb.  Not all leg pain is related to PVD. Other medical conditions can cause leg pain such  as:  Blood clots (embolism) or Deep Vein Thrombosis.  Inflammation of the blood vessels (vasculitis).  Spinal stenosis. DIAGNOSIS  Diagnosis of PVD can involve several different types of tests. These can include:  Pulse Volume Recording Method (PVR). This test is simple, painless and does not involve the use of X-rays. PVR involves measuring and comparing the blood pressure in the arms and legs. An ABI (Ankle-Brachial Index) is calculated. The normal ratio of blood pressures is 1. As this number becomes smaller, it indicates more severe disease.  < 0.95  indicates significant narrowing in one or more leg vessels.  <0.8 there will usually be pain in the foot, leg or buttock with exercise.  <0.4 will usually have pain in the legs at rest.  <0.25  usually indicates limb threatening PVD.  Doppler detection of pulses in the legs. This test is painless and checks to see if you have a pulses in your legs/feet.  A dye or contrast material (a substance that highlights the blood vessels so they show up on x-ray) may be given to help your caregiver better see the arteries for the following tests. The dye is eliminated from your body by the kidney's. Your caregiver may order blood work to check your kidney function and other laboratory values before the following tests are performed:  Magnetic Resonance Angiography (MRA). An MRA is a picture study of the blood vessels and arteries. The MRA machine uses a large magnet to produce images of the blood vessels.  Computed Tomography Angiography (CTA). A CTA is a   specialized x-ray that looks at how the blood flows in your blood vessels. An IV may be inserted into your arm so contrast dye can be injected.  Angiogram. Is a procedure that uses x-rays to look at your blood vessels. This procedure is minimally invasive, meaning a small incision (cut) is made in your groin. A small tube (catheter) is then inserted into the artery of your groin. The catheter is  guided to the blood vessel or artery your caregiver wants to examine. Contrast dye is injected into the catheter. X-rays are then taken of the blood vessel or artery. After the images are obtained, the catheter is taken out. TREATMENT  Treatment of PVD involves many interventions which may include:  Lifestyle changes:  Quitting smoking.  Exercise.  Following a low fat, low cholesterol diet.  Control of diabetes.  Foot care is very important to the PVD patient. Good foot care can help prevent infection.  Medication:  Cholesterol-lowering medicine.  Blood pressure medicine.  Anti-platelet drugs.  Certain medicines may reduce symptoms of Intermittent Claudication.  Interventional/Surgical options:  Angioplasty. An Angioplasty is a procedure that inflates a balloon in the blocked artery. This opens the blocked artery to improve blood flow.  Stent Implant. A wire mesh tube (stent) is placed in the artery. The stent expands and stays in place, allowing the artery to remain open.  Peripheral Bypass Surgery. This is a surgical procedure that reroutes the blood around a blocked artery to help improve blood flow. This type of procedure may be performed if Angioplasty or stent implants are not an option. SEEK IMMEDIATE MEDICAL CARE IF:   You develop pain or numbness in your arms or legs.  Your arm or leg turns cold, becomes blue in color.  You develop redness, warmth, swelling and pain in your arms or legs. MAKE SURE YOU:   Understand these instructions.  Will watch your condition.  Will get help right away if you are not doing well or get worse. Document Released: 05/22/2004 Document Revised: 07/07/2011 Document Reviewed: 04/18/2008 [employer] Patient Information 2014 [employer].  

## 2013-12-06 ENCOUNTER — Encounter: Payer: Self-pay | Admitting: Internal Medicine

## 2013-12-12 ENCOUNTER — Telehealth: Payer: Self-pay

## 2013-12-12 NOTE — Telephone Encounter (Signed)
Pt of Dr. Lutricia Feil with c/o rectal bleeding. Pt scheduled to see Dr. Henrene Pastor 12/13/13@2pm . Pts son aware of appt.

## 2013-12-13 ENCOUNTER — Encounter: Payer: Self-pay | Admitting: Internal Medicine

## 2013-12-13 ENCOUNTER — Telehealth: Payer: Self-pay

## 2013-12-13 ENCOUNTER — Ambulatory Visit (INDEPENDENT_AMBULATORY_CARE_PROVIDER_SITE_OTHER): Payer: Commercial Managed Care - HMO | Admitting: Internal Medicine

## 2013-12-13 VITALS — BP 126/60 | HR 52 | Ht 69.75 in | Wt 164.4 lb

## 2013-12-13 DIAGNOSIS — K219 Gastro-esophageal reflux disease without esophagitis: Secondary | ICD-10-CM

## 2013-12-13 DIAGNOSIS — K625 Hemorrhage of anus and rectum: Secondary | ICD-10-CM

## 2013-12-13 DIAGNOSIS — R195 Other fecal abnormalities: Secondary | ICD-10-CM

## 2013-12-13 MED ORDER — MOVIPREP 100 G PO SOLR: 1.0000 | Freq: Once | ORAL | Status: DC

## 2013-12-13 NOTE — Telephone Encounter (Signed)
  12/13/2013   RE: Ian Cummings. DOB: [birth year] MRN: [id number]   Dear Dr. Wynonia Lawman,    We have scheduled the above patient for an endoscopic procedure. Our records show that he is on anticoagulation therapy.   Please advise as to how long the patient may come off his therapy of Plavix prior to the procedure, which is scheduled for 01/24/2014.  Please fax back/ or route the completed form to [facility] at [phone].  Sincerely,    Phillis Haggis

## 2013-12-13 NOTE — Progress Notes (Signed)
HISTORY OF PRESENT ILLNESS:  Ian Cummings. is a 80 y.o. male with multiple significant medical problems who is referred today regarding repeatedly Hemoccult-positive stool and minor rectal bleeding. The patient has significant vascular disease including coronary artery disease with redo CABG June 2014, cerebral vascular disease status post carotid artery stent, and peripheral vascular disease without intervention. He is on chronic Plavix and aspirin therapy. The patient underwent his routine physical examination July 2015 (outside records reviewed). Hemoccult studies were positive on several occasions. His blood work is unremarkable including normal hemoglobin of 12.9 and MCV of 94.7. His GI review of systems is only remarkable for minor rectal bleeding on the stool and tissue infrequently. He does have a history of chronic GERD for which he takes PPI. On PPI, no symptoms. He denies dysphagia, abdominal pain, change in bowel habits, or weight loss. He denies other NSAIDs. He does take B12 replacement therapy. He is quite active and enjoys fishing. Patient states that he underwent colonoscopy remotely (greater than 10 years ago) for routine purposes. Results unknown, though he reports no problems found  REVIEW OF SYSTEMS:  All non-GI ROS negative except for arthritis, depression, itching, muscle cramps, increased urination at night   Past Medical History  Diagnosis Date  . CAD (coronary artery disease)   . Hyperlipidemia   . Hypertension   . Arthritis     Gout  . GERD (gastroesophageal reflux disease)   . MI (myocardial infarction) 4/14  . Gout   . BPH (benign prostatic hypertrophy)   . Carotid artery disease     Right carotid stent 2013  . TIA (transient ischemic attack)     Hx: of  . H/O hiatal hernia   . Neuropathy     Hx: of in B/L toes  . Stroke   . AAA (abdominal aortic aneurysm)   . Peripheral vascular disease     with claudication  . Anxiety   . Depression   . Pre-diabetes    . Hypothyroidism     Past Surgical History  Procedure Laterality Date  . Coronary artery bypass graft  1995  . Foot surgery Left   . Coronary angioplasty with stent placement  2010  . Inguinal hernia repair Bilateral     X 2   . Carotid stent insertion Right 2013    right at [facility]  . Tonsillectomy    . Colonoscopy      Hx: of  . Retinal detachment surgery Left     Hx: of left eye  . Cataract extraction Bilateral     Hx: of both eyes  . Fracture surgery Left     Hx: of Left heel  . Coronary artery bypass graft N/A 10/18/2012    Procedure: REDO CORONARY ARTERY BYPASS GRAFTING (CABG);  Surgeon: Grace Isaac, MD;  Location: [place];  Service: Open Heart Surgery;  Laterality: N/A;  off pump times two using endoscopically harvested left saphenous vein  . Intraoperative transesophageal echocardiogram N/A 10/18/2012    Procedure: INTRAOPERATIVE TRANSESOPHAGEAL ECHOCARDIOGRAM;  Surgeon: Grace Isaac, MD;  Location: [place];  Service: Open Heart Surgery;  Laterality: N/A;    Social History Ian Cummings.  reports that he quit smoking about 38 years ago. His smoking use included Cigarettes. He smoked 0.00 packs per day. He has never used smokeless tobacco. He reports that he drinks about 10.8 - 12 ounces of alcohol per week. He reports that he does not use illicit drugs.  family  history includes Diabetes in his father; Heart attack in his father and mother; Heart disease in his father and mother; Hyperlipidemia in his father; Hypertension in his father; Stroke in his sister.  Allergies  Allergen Reactions  . Metoprolol Itching       PHYSICAL EXAMINATION: Vital signs: BP 126/60  Pulse 52  Ht 5' 9.75" (1.772 m)  Wt 164 lb 6 oz (74.56 kg)  BMI 23.75 kg/m2  Constitutional: generally well-appearing, no acute distress Psychiatric: alert and oriented x3, cooperative Eyes: extraocular movements intact, anicteric, conjunctiva pink Mouth: oral pharynx moist, no  lesions Neck: supple no lymphadenopathy Cardiovascular: heart regular rate and rhythm, no murmur Lungs: clear to auscultation bilaterally Abdomen: soft, nontender, nondistended, no obvious ascites, no peritoneal signs, normal bowel sounds, no organomegaly Rectal: Deferred until colonoscopy Extremities: no lower extremity edema bilaterally Skin: no lesions on visible extremities Neuro: No focal deficits.   ASSESSMENT:  #1. Hemoccult-positive stool. #2. Minor intermittent rectal bleeding #3. Chronic GERD managed with PPI #4. Multiple significant medical problems with vasculopathy on aspirin Plavix   PLAN:  #1. Colonoscopy and upper endoscopy to evaluate repeatedly Hemoccult-positive stool in a patient on chronic aspirin and Plavix therapy. He is high risk due to his comorbidities.The nature of the procedure, as well as the risks, benefits, and alternatives were carefully and thoroughly reviewed with the patient. Ample time for discussion and questions allowed. The patient understood, was satisfied, and agreed to proceed. Movi prep prescribed. Patient instructed on its use. #2. Check with the patient's cardiologist, Dr. Tollie Eth, regarding the feasibility of holding Plavix 5-7 days prior to his procedure. He would remain on aspirin.

## 2013-12-13 NOTE — Patient Instructions (Signed)

## 2013-12-13 NOTE — Telephone Encounter (Signed)
Faxing anti-coag letter to Dr. Wynonia Lawman

## 2013-12-20 ENCOUNTER — Encounter: Payer: Self-pay | Admitting: Family

## 2013-12-20 ENCOUNTER — Telehealth: Payer: Self-pay

## 2013-12-20 NOTE — Telephone Encounter (Signed)
Spoke with patient and relayed the information the Dr. Wynonia Lawman had said he could stop his Plavix for 5 days prior to procedure and then restart the day of.  He is to stay on his Aspirin.  Patient acknowledged and undertsood

## 2013-12-21 ENCOUNTER — Ambulatory Visit (INDEPENDENT_AMBULATORY_CARE_PROVIDER_SITE_OTHER): Payer: Commercial Managed Care - HMO | Admitting: Family

## 2013-12-21 ENCOUNTER — Encounter: Payer: Self-pay | Admitting: Internal Medicine

## 2013-12-21 ENCOUNTER — Ambulatory Visit (INDEPENDENT_AMBULATORY_CARE_PROVIDER_SITE_OTHER)
Admission: RE | Admit: 2013-12-21 | Discharge: 2013-12-21 | Disposition: A | Discharge status: Home / Self Care | Payer: Medicare HMO | Source: Ambulatory Visit | Attending: Surgery | Admitting: Surgery

## 2013-12-21 ENCOUNTER — Ambulatory Visit (HOSPITAL_COMMUNITY)
Admission: RE | Admit: 2013-12-21 | Discharge: 2013-12-21 | Disposition: A | Discharge status: Home / Self Care | Payer: Medicare HMO | Source: Ambulatory Visit | Attending: Family | Admitting: Family

## 2013-12-21 ENCOUNTER — Ambulatory Visit (INDEPENDENT_AMBULATORY_CARE_PROVIDER_SITE_OTHER)
Admission: RE | Admit: 2013-12-21 | Discharge: 2013-12-21 | Disposition: A | Discharge status: Home / Self Care | Payer: Medicare HMO | Source: Ambulatory Visit | Attending: Family | Admitting: Family

## 2013-12-21 ENCOUNTER — Encounter: Payer: Self-pay | Admitting: Family

## 2013-12-21 ENCOUNTER — Other Ambulatory Visit: Payer: Self-pay | Admitting: Vascular Surgery

## 2013-12-21 VITALS — BP 139/64 | HR 45 | Resp 14 | Ht 69.5 in | Wt 165.0 lb

## 2013-12-21 DIAGNOSIS — Z48812 Encounter for surgical aftercare following surgery on the circulatory system: Secondary | ICD-10-CM | POA: Insufficient documentation

## 2013-12-21 DIAGNOSIS — I6529 Occlusion and stenosis of unspecified carotid artery: Secondary | ICD-10-CM

## 2013-12-21 DIAGNOSIS — I70219 Atherosclerosis of native arteries of extremities with intermittent claudication, unspecified extremity: Secondary | ICD-10-CM

## 2013-12-21 DIAGNOSIS — I739 Peripheral vascular disease, unspecified: Secondary | ICD-10-CM | POA: Diagnosis present

## 2013-12-21 NOTE — Patient Instructions (Signed)
Peripheral Vascular Disease Peripheral Vascular Disease (PVD), also called Peripheral Arterial Disease (PAD), is a circulation problem caused by cholesterol (atherosclerotic plaque) deposits in the arteries. PVD commonly occurs in the lower extremities (legs) but it can occur in other areas of the body, such as your arms. The cholesterol buildup in the arteries reduces blood flow which can cause pain and other serious problems. The presence of PVD can place a person at risk for Coronary Artery Disease (CAD).  CAUSES  Causes of PVD can be many. It is usually associated with more than one risk factor such as:   High Cholesterol.  Smoking.  Diabetes.  Lack of exercise or inactivity.  High blood pressure (hypertension).  Obesity.  Family history. SYMPTOMS   When the lower extremities are affected, patients with PVD may experience:  Leg pain with exertion or physical activity. This is called INTERMITTENT CLAUDICATION. This may present as cramping or numbness with physical activity. The location of the pain is associated with the level of blockage. For example, blockage at the abdominal level (distal abdominal aorta) may result in buttock or hip pain. Lower leg arterial blockage may result in calf pain.  As PVD becomes more severe, pain can develop with less physical activity.  In people with severe PVD, leg pain may occur at rest.  Other PVD signs and symptoms:  Leg numbness or weakness.  Coldness in the affected leg or foot, especially when compared to the other leg.  A change in leg color.  Patients with significant PVD are more prone to ulcers or sores on toes, feet or legs. These may take longer to heal or may reoccur. The ulcers or sores can become infected.  If signs and symptoms of PVD are ignored, gangrene may occur. This can result in the loss of toes or loss of an entire limb.  Not all leg pain is related to PVD. Other medical conditions can cause leg pain such  as:  Blood clots (embolism) or Deep Vein Thrombosis.  Inflammation of the blood vessels (vasculitis).  Spinal stenosis. DIAGNOSIS  Diagnosis of PVD can involve several different types of tests. These can include:  Pulse Volume Recording Method (PVR). This test is simple, painless and does not involve the use of X-rays. PVR involves measuring and comparing the blood pressure in the arms and legs. An ABI (Ankle-Brachial Index) is calculated. The normal ratio of blood pressures is 1. As this number becomes smaller, it indicates more severe disease.  < 0.95 - indicates significant narrowing in one or more leg vessels.  <0.8 - there will usually be pain in the foot, leg or buttock with exercise.  <0.4 - will usually have pain in the legs at rest.  <0.25 - usually indicates limb threatening PVD.  Doppler detection of pulses in the legs. This test is painless and checks to see if you have a pulses in your legs/feet.  A dye or contrast material (a substance that highlights the blood vessels so they show up on x-ray) may be given to help your caregiver better see the arteries for the following tests. The dye is eliminated from your body by the kidney's. Your caregiver may order blood work to check your kidney function and other laboratory values before the following tests are performed:  Magnetic Resonance Angiography (MRA). An MRA is a picture study of the blood vessels and arteries. The MRA machine uses a large magnet to produce images of the blood vessels.  Computed Tomography Angiography (CTA). A CTA   is a specialized x-ray that looks at how the blood flows in your blood vessels. An IV may be inserted into your arm so contrast dye can be injected.  Angiogram. Is a procedure that uses x-rays to look at your blood vessels. This procedure is minimally invasive, meaning a small incision (cut) is made in your groin. A small tube (catheter) is then inserted into the artery of your groin. The catheter  is guided to the blood vessel or artery your caregiver wants to examine. Contrast dye is injected into the catheter. X-rays are then taken of the blood vessel or artery. After the images are obtained, the catheter is taken out. TREATMENT  Treatment of PVD involves many interventions which may include:  Lifestyle changes:  Quitting smoking.  Exercise.  Following a low fat, low cholesterol diet.  Control of diabetes.  Foot care is very important to the PVD patient. Good foot care can help prevent infection.  Medication:  Cholesterol-lowering medicine.  Blood pressure medicine.  Anti-platelet drugs.  Certain medicines may reduce symptoms of Intermittent Claudication.  Interventional/Surgical options:  Angioplasty. An Angioplasty is a procedure that inflates a balloon in the blocked artery. This opens the blocked artery to improve blood flow.  Stent Implant. A wire mesh tube (stent) is placed in the artery. The stent expands and stays in place, allowing the artery to remain open.  Peripheral Bypass Surgery. This is a surgical procedure that reroutes the blood around a blocked artery to help improve blood flow. This type of procedure may be performed if Angioplasty or stent implants are not an option. SEEK IMMEDIATE MEDICAL CARE IF:   You develop pain or numbness in your arms or legs.  Your arm or leg turns cold, becomes blue in color.  You develop redness, warmth, swelling and pain in your arms or legs. MAKE SURE YOU:   Understand these instructions.  Will watch your condition.  Will get help right away if you are not doing well or get worse. Document Released: 05/22/2004 Document Revised: 07/07/2011 Document Reviewed: 04/18/2008 ExitCare Patient Information 2015 [employer]. This information is not intended to replace advice given to you by your health care provider. Make sure you discuss any questions you have with your health care provider.   Stroke  Prevention Some medical conditions and behaviors are associated with an increased chance of having a stroke. You may prevent a stroke by making healthy choices and managing medical conditions. HOW CAN I REDUCE MY RISK OF HAVING A STROKE?   Stay physically active. Get at least 30 minutes of activity on most or all days.  Do not smoke. It may also be helpful to avoid exposure to secondhand smoke.  Limit alcohol use. Moderate alcohol use is considered to be:  No more than 2 drinks per day for men.  No more than 1 drink per day for nonpregnant women.  Eat healthy foods. This involves:  Eating 5 or more servings of fruits and vegetables a day.  Making dietary changes that address high blood pressure (hypertension), high cholesterol, diabetes, or obesity.  Manage your cholesterol levels.  Making food choices that are high in fiber and low in saturated fat, trans fat, and cholesterol may control cholesterol levels.  Take any prescribed medicines to control cholesterol as directed by your health care provider.  Manage your diabetes.  Controlling your carbohydrate and sugar intake is recommended to manage diabetes.  Take any prescribed medicines to control diabetes as directed by your health care provider.    Control your hypertension.  Making food choices that are low in salt (sodium), saturated fat, trans fat, and cholesterol is recommended to manage hypertension.  Take any prescribed medicines to control hypertension as directed by your health care provider.  Maintain a healthy weight.  Reducing calorie intake and making food choices that are low in sodium, saturated fat, trans fat, and cholesterol are recommended to manage weight.  Stop drug abuse.  Avoid taking birth control pills.  Talk to your health care provider about the risks of taking birth control pills if you are over 35 years old, smoke, get migraines, or have ever had a blood clot.  Get evaluated for sleep  disorders (sleep apnea).  Talk to your health care provider about getting a sleep evaluation if you snore a lot or have excessive sleepiness.  Take medicines only as directed by your health care provider.  For some people, aspirin or blood thinners (anticoagulants) are helpful in reducing the risk of forming abnormal blood clots that can lead to stroke. If you have the irregular heart rhythm of atrial fibrillation, you should be on a blood thinner unless there is a good reason you cannot take them.  Understand all your medicine instructions.  Make sure that other conditions (such as anemia or atherosclerosis) are addressed. SEEK IMMEDIATE MEDICAL CARE IF:   You have sudden weakness or numbness of the face, arm, or leg, especially on one side of the body.  Your face or eyelid droops to one side.  You have sudden confusion.  You have trouble speaking (aphasia) or understanding.  You have sudden trouble seeing in one or both eyes.  You have sudden trouble walking.  You have dizziness.  You have a loss of balance or coordination.  You have a sudden, severe headache with no known cause.  You have new chest pain or an irregular heartbeat. Any of these symptoms may represent a serious problem that is an emergency. Do not wait to see if the symptoms will go away. Get medical help at once. Call your local emergency services (911 in [place] Do not drive yourself to the hospital. Document Released: 05/22/2004 Document Revised: 08/29/2013 Document Reviewed: 10/15/2012 ExitCare Patient Information 2015 [employer]. This information is not intended to replace advice given to you by your health care provider. Make sure you discuss any questions you have with your health care provider.  

## 2013-12-21 NOTE — Progress Notes (Signed)
[facility] [place] HISTORY AND PHYSICAL   MRN : [id number]  History of Present Illness:   Ian Streight. is a 80 y.o. male patient of Dr. Trula Slade who had CABG in 1996 x 4-5 vessels, off pump CABG x 26 September 2012 by Dr. Servando Snare. He has complaints of cramping in his thighs and calves bilaterally when he walks. He can walk 200-300 ft before he cramps in calves and thighs, this has not changed, relieved by rest.  He has bilateral hip pain, lateral aspects, with walking, this pain was relieved for about a week after cortisone injections.  He does not have any non healing ulcers or sores. He has also been seen by Dr. Vicente Masson at [facility] in the past.  He denies any hx of stroke, but states a couple of years ago, he did have an episode of double vision for about an hour and this resolved.  He does have a hx of right carotid stenting by Dr. Harvel Ricks at [facility] June 12, 2011. Per the pt, he states that he does have ~ 70% blockage in the left carotid artery. He still sees Dr. Harvel Ricks for carotid artery surveillance. He denies any weakness or numbness in any extremity.  His LE PAD is monitored by our practice.  He is on a statin for his hypercholesterolemia. He is also on a beta blocker for his heart disease/hypertension. He is also on Plavix/ASA.  The patient reports New Medical or Surgical History: blood found in his stool, will see Dr. Henrene Pastor, gastroenterologist, for evaluation of this at the end of September. He plans to go to [place] in a couple of weeks, for about a week.    Pt Diabetic: Pre diabetic Pt smoker: former smoker, quit in the 1970's   Current Outpatient Prescriptions  Medication Sig Dispense Refill  . allopurinol (ZYLOPRIM) 300 MG tablet Take 300 mg by mouth daily.      . AMBULATORY NON FORMULARY MEDICATION Experimental Drug Evacetrapib or Placebo Take 1 tablet by mouth once daily      . amLODipine (NORVASC) 10 MG tablet Take 10 mg by mouth daily.      Marland Kitchen aspirin 81  MG tablet Take 81 mg by mouth daily.      Marland Kitchen atenolol (TENORMIN) 50 MG tablet Take 50 mg by mouth daily.      . cholecalciferol (VITAMIN D) 1000 UNITS tablet Take 2,000 Units by mouth daily.      . clopidogrel (PLAVIX) 75 MG tablet Take 75 mg by mouth daily.      . fish oil-omega-3 fatty acids 1000 MG capsule Take 1 g by mouth daily.       . hydrochlorothiazide (HYDRODIURIL) 12.5 MG tablet Take 25 mg by mouth daily.       . lansoprazole (PREVACID) 15 MG capsule Take 15 mg by mouth daily.      Marland Kitchen levothyroxine (SYNTHROID, LEVOTHROID) 88 MCG tablet Take 88 mcg by mouth daily before breakfast.      . MOVIPREP 100 G SOLR Take 1 kit (200 g total) by mouth once.  1 kit  0  . multivitamin (RENA-VIT) TABS tablet Take 1 tablet by mouth daily.      Marland Kitchen PARoxetine (PAXIL) 20 MG tablet Take 20 mg by mouth every morning.      . ramipril (ALTACE) 2.5 MG tablet Take 5 mg by mouth 2 (two) times daily.       . rosuvastatin (CRESTOR) 40 MG tablet Take 40 mg by  mouth daily.      . vitamin B-12 (CYANOCOBALAMIN) 1000 MCG tablet Take 1,000 mcg by mouth daily.       No current facility-administered medications for this visit.    Past Medical History  Diagnosis Date  . CAD (coronary artery disease)   . Hyperlipidemia   . Hypertension   . Arthritis     Gout  . GERD (gastroesophageal reflux disease)   . MI (myocardial infarction) 4/14  . Gout   . BPH (benign prostatic hypertrophy)   . Carotid artery disease     Right carotid stent 2013  . TIA (transient ischemic attack)     Hx: of  . H/O hiatal hernia   . Neuropathy     Hx: of in B/L toes  . Stroke   . AAA (abdominal aortic aneurysm)   . Peripheral vascular disease     with claudication  . Anxiety   . Depression   . Pre-diabetes   . Hypothyroidism     Social History History  Substance Use Topics  . Smoking status: Former Smoker    Types: Cigarettes    Quit date: 04/29/1975  . Smokeless tobacco: Never Used  . Alcohol Use: 10.8 - 12.0 oz/week     12 Glasses of wine, 6-8 Shots of liquor per week     Comment: Occasional drink    Family History Family History  Problem Relation Age of Onset  . Heart disease Mother     After 82 yrs of age  . Heart attack Mother   . Hyperlipidemia Father   . Hypertension Father   . Heart disease Father     After 76 yrs of age  . Diabetes Father   . Heart attack Father   . Stroke Sister     Surgical History Past Surgical History  Procedure Laterality Date  . Coronary artery bypass graft  1995  . Foot surgery Left   . Coronary angioplasty with stent placement  2010  . Inguinal hernia repair Bilateral     X 2   . Carotid stent insertion Right 2013    right at [facility]  . Tonsillectomy    . Colonoscopy      Hx: of  . Retinal detachment surgery Left     Hx: of left eye  . Cataract extraction Bilateral     Hx: of both eyes  . Fracture surgery Left     Hx: of Left heel  . Coronary artery bypass graft N/A 10/18/2012    Procedure: REDO CORONARY ARTERY BYPASS GRAFTING (CABG);  Surgeon: Grace Isaac, MD;  Location: [place];  Service: Open Heart Surgery;  Laterality: N/A;  off pump times two using endoscopically harvested left saphenous vein  . Intraoperative transesophageal echocardiogram N/A 10/18/2012    Procedure: INTRAOPERATIVE TRANSESOPHAGEAL ECHOCARDIOGRAM;  Surgeon: Grace Isaac, MD;  Location: [place];  Service: Open Heart Surgery;  Laterality: N/A;  . Eye surgery      Allergies  Allergen Reactions  . Metoprolol Itching    Current Outpatient Prescriptions  Medication Sig Dispense Refill  . allopurinol (ZYLOPRIM) 300 MG tablet Take 300 mg by mouth daily.      . AMBULATORY NON FORMULARY MEDICATION Experimental Drug Evacetrapib or Placebo Take 1 tablet by mouth once daily      . amLODipine (NORVASC) 10 MG tablet Take 10 mg by mouth daily.      Marland Kitchen aspirin 81 MG tablet Take 81 mg by mouth daily.      Marland Kitchen  atenolol (TENORMIN) 50 MG tablet Take 50 mg by mouth daily.      .  cholecalciferol (VITAMIN D) 1000 UNITS tablet Take 2,000 Units by mouth daily.      . clopidogrel (PLAVIX) 75 MG tablet Take 75 mg by mouth daily.      . fish oil-omega-3 fatty acids 1000 MG capsule Take 1 g by mouth daily.       . hydrochlorothiazide (HYDRODIURIL) 12.5 MG tablet Take 25 mg by mouth daily.       . lansoprazole (PREVACID) 15 MG capsule Take 15 mg by mouth daily.      Marland Kitchen levothyroxine (SYNTHROID, LEVOTHROID) 88 MCG tablet Take 88 mcg by mouth daily before breakfast.      . MOVIPREP 100 G SOLR Take 1 kit (200 g total) by mouth once.  1 kit  0  . multivitamin (RENA-VIT) TABS tablet Take 1 tablet by mouth daily.      Marland Kitchen PARoxetine (PAXIL) 20 MG tablet Take 20 mg by mouth every morning.      . ramipril (ALTACE) 2.5 MG tablet Take 5 mg by mouth 2 (two) times daily.       . rosuvastatin (CRESTOR) 40 MG tablet Take 40 mg by mouth daily.      . vitamin B-12 (CYANOCOBALAMIN) 1000 MCG tablet Take 1,000 mcg by mouth daily.       No current facility-administered medications for this visit.     REVIEW OF SYSTEMS: See HPI for pertinent positives and negatives.  Physical Examination Filed Vitals:   12/21/13 1240 12/21/13 1243  BP: 128/66 139/64  Pulse: 46 45  Resp:  14  Height:  5' 9.5" (1.765 m)  Weight:  165 lb (74.844 kg)  SpO2:  99%   Body mass index is 24.03 kg/(m^2).  General: A&O x 3, WDWN.  Gait: normal  Eyes: PERRLA.  Pulmonary: CTAB, without wheezes , rales or rhonchi.  Cardiac: regular Rhythm, bradycardic , without detected murmur.   Carotid Bruits  Left  Right    Negative  Positive  Aorta is not palpable.  Radial pulses: are 2+ palpable and =   VASCULAR EXAM:  Extremities without ischemic changes  without Gangrene; without open wounds.  LE Pulses  LEFT  RIGHT   FEMORAL  palpable  palpable   POPLITEAL  not palpable  not palpable   POSTERIOR TIBIAL  not palpable, monophasic by Doppler  not palpable, not Dopplerable   DORSALIS PEDIS  ANTERIOR TIBIAL  not  palpable, monophasic by Doppler  not palpable, monophasic by Doppler   PERONEAL  not Palpable, not Dopplerable  not Palpable, not Dopplerable   Abdomen: soft, NT, no masses.  Skin: no rashes, no ulcers noted.  Musculoskeletal: no muscle wasting or atrophy.  Neurologic: A&O X 3; Appropriate Affect ; SENSATION: normal; MOTOR FUNCTION: moving all extremities equally, motor strength 5/5 throughout. Speech is fluent/normal. CN 2-12 intact.   Non-Invasive Vascular Imaging (12/21/2013):  CEREBROVASCULAR DUPLEX EVALUATION    INDICATION: Carotid artery disease     PREVIOUS INTERVENTION(S): Right carotid stent by Dr. Harvel Ricks at [facility], Feb. 14, 2013.    DUPLEX EXAM: Per patient, Dr. Brigitte Pulse would like him to have continued surveillance for his carotid disease performed [facility]     RIGHT  LEFT  Peak Systolic Velocities (cm/s) End Diastolic Velocities (cm/s) Plaque LOCATION Peak Systolic Velocities (cm/s) End Diastolic Velocities (cm/s) Plaque  66 9  CCA PROXIMAL 61 10   55 8  CCA MID 58 10   53 11  CCA DISTAL 51 9 HT  333 24 HT ECA 195 10 HT  69 11  ICA PROXIMAL 187 42 HT  59 17  ICA MID 88 20 HT  80 20  ICA DISTAL 54 16      ICA / CCA Ratio (PSV) 3.22  Antegrade  Vertebral Flow Antegrade   793 Brachial Systolic Pressure (mmHg) 903  Triphasic  Brachial Artery Waveforms Triphasic     Plaque Morphology:  HM = Homogeneous, HT = Heterogeneous, CP = Calcific Plaque, SP = Smooth Plaque, IP = Irregular Plaque     ADDITIONAL FINDINGS: Of note: patient reports experiencing a floater in his left eye for the last few weeks; since his cataracts surgery.     IMPRESSION: Patent right carotid artery stent with no evidence of stenosis. Right external carotid artery stenosis.  Left internal carotid artery velocities suggest a 40-59% stenosis.     Compared to the previous exam:  No prior exam performed at this facility for comparison.    ABI's: Right: <0.40 with  monophasic waveforms indicating critical limb ischemia. Left: 0.54 with monophasic waveforms indicating severe arterial occlusive disease.  ASSESSMENT:  Ian Reim. is a 80 y.o. male patient of Dr. Trula Slade who is s/p right carotid stenting by Dr. Harvel Ricks at [facility] June 12, 2011. He also has mild to moderate claudication symptoms in both calves and thighs, right worse than left, no tissue loss.  Today's carotid Duplex demonstrates a patent right carotid artery stent with no evidence of stenosis, right external carotid artery stenosis, and left internal carotid artery velocities suggest a 40-59% stenosis.   Dr. Stephens Shire 12/20/12 progress note: If his symptoms have not greatly improved, he will need an arteriogram to determine further interventions. He has had the saphenous veins in both legs removed and would need a gortex graft if in need of lower extremity bypass in the future. However, since patient is having GI bleed issue, and will be evaluated for this in a month, will see pt back in 2 months and at that point will see what is the status of his GI bleeding and his claudication (discussed with Dr. Oneida Alar).  PLAN:   Based on today's exam and non-invasive vascular lab results, the patient will follow up in 2 months. I discussed in depth with the patient the nature of atherosclerosis, and emphasized the importance of maximal medical management including strict control of blood pressure, blood glucose, and lipid levels, obtaining regular exercise, and cessation of smoking.  The patient is aware that without maximal medical management the underlying atherosclerotic disease process will progress, limiting the benefit of any interventions.  The patient was given information about stroke prevention and what symptoms should prompt the patient to seek immediate medical care.  The patient was given information about PAD including signs, symptoms, treatment, what symptoms should prompt the  patient to seek immediate medical care, and risk reduction measures to take. Thank you for allowing [place] to participate in this patient's care.  Clemon Chambers, RN, MSN, FNP-C [facility] Office: [phone]  Clinic MD: [facility] 12/21/2013 12:55 PM

## 2014-01-24 ENCOUNTER — Ambulatory Visit (AMBULATORY_SURGERY_CENTER): Payer: Commercial Managed Care - HMO | Admitting: Internal Medicine

## 2014-01-24 ENCOUNTER — Encounter: Payer: Self-pay | Admitting: Internal Medicine

## 2014-01-24 VITALS — BP 143/67 | HR 50 | Temp 97.4°F | Resp 18 | Ht 69.75 in | Wt 164.0 lb

## 2014-01-24 DIAGNOSIS — D126 Benign neoplasm of colon, unspecified: Secondary | ICD-10-CM

## 2014-01-24 DIAGNOSIS — K625 Hemorrhage of anus and rectum: Secondary | ICD-10-CM

## 2014-01-24 DIAGNOSIS — D122 Benign neoplasm of ascending colon: Secondary | ICD-10-CM

## 2014-01-24 DIAGNOSIS — D120 Benign neoplasm of cecum: Secondary | ICD-10-CM

## 2014-01-24 DIAGNOSIS — D124 Benign neoplasm of descending colon: Secondary | ICD-10-CM

## 2014-01-24 DIAGNOSIS — K219 Gastro-esophageal reflux disease without esophagitis: Secondary | ICD-10-CM

## 2014-01-24 DIAGNOSIS — D123 Benign neoplasm of transverse colon: Secondary | ICD-10-CM

## 2014-01-24 DIAGNOSIS — D125 Benign neoplasm of sigmoid colon: Secondary | ICD-10-CM

## 2014-01-24 DIAGNOSIS — R195 Other fecal abnormalities: Secondary | ICD-10-CM

## 2014-01-24 MED ORDER — SODIUM CHLORIDE 0.9 % IV SOLN: 500.0000 mL | INTRAVENOUS | Status: DC

## 2014-01-24 NOTE — Progress Notes (Signed)
No problems noted in the recovery room. maw 

## 2014-01-24 NOTE — Progress Notes (Signed)
Called to room to assist during endoscopic procedure.  Patient ID and intended procedure confirmed with present staff. Received instructions for my participation in the procedure from the performing physician.  

## 2014-01-24 NOTE — Progress Notes (Signed)
Report to PACU, RN, vss, BBS= Clear.  

## 2014-01-24 NOTE — Patient Instructions (Addendum)
YOU HAD AN ENDOSCOPIC PROCEDURE TODAY AT THE [facility]: Refer to the procedure report that was given to you for any specific questions about what was found during the examination.  If the procedure report does not answer your questions, please call your gastroenterologist to clarify.  If you requested that your care partner not be given the details of your procedure findings, then the procedure report has been included in a sealed envelope for you to review at your convenience later.  YOU SHOULD EXPECT: Some feelings of bloating in the abdomen. Passage of more gas than usual.  Walking can help get rid of the air that was put into your GI tract during the procedure and reduce the bloating. If you had a lower endoscopy (such as a colonoscopy or flexible sigmoidoscopy) you may notice spotting of blood in your stool or on the toilet paper. If you underwent a bowel prep for your procedure, then you may not have a normal bowel movement for a few days.  DIET: Your first meal following the procedure should be a light meal and then it is ok to progress to your normal diet.  A half-sandwich or bowl of soup is an example of a good first meal.  Heavy or fried foods are harder to digest and may make you feel nauseous or bloated.  Likewise meals heavy in dairy and vegetables can cause extra gas to form and this can also increase the bloating.  Drink plenty of fluids but you should avoid alcoholic beverages for 24 hours.  ACTIVITY: Your care partner should take you home directly after the procedure.  You should plan to take it easy, moving slowly for the rest of the day.  You can resume normal activity the day after the procedure however you should NOT DRIVE or use heavy machinery for 24 hours (because of the sedation medicines used during the test).    SYMPTOMS TO REPORT IMMEDIATELY: A gastroenterologist can be reached at any hour.  During normal business hours, 8:30 AM to 5:00 PM Monday through Friday,  call [phone].  After hours and on weekends, please call the GI answering service at [phone] who will take a message and have the physician on call contact you.   Following lower endoscopy (colonoscopy or flexible sigmoidoscopy):  Excessive amounts of blood in the stool  Significant tenderness or worsening of abdominal pains  Swelling of the abdomen that is new, acute  Fever of 100F or higher  Following upper endoscopy (EGD)  Vomiting of blood or coffee ground material  New chest pain or pain under the shoulder blades  Painful or persistently difficult swallowing  New shortness of breath  Fever of 100F or higher  Black, tarry-looking stools  FOLLOW UP: If any biopsies were taken you will be contacted by phone or by letter within the next 1-3 weeks.  Call your gastroenterologist if you have not heard about the biopsies in 3 weeks.  Our staff will call the home number listed on your records the next business day following your procedure to check on you and address any questions or concerns that you may have at that time regarding the information given to you following your procedure. This is a courtesy call and so if there is no answer at the home number and we have not heard from you through the emergency physician on call, we will assume that you have returned to your regular daily activities without incident.  SIGNATURES/CONFIDENTIALITY: You and/or your care   partner have signed paperwork which will be entered into your electronic medical record.  These signatures attest to the fact that that the information above on your After Visit Summary has been reviewed and is understood.  Full responsibility of the confidentiality of this discharge information lies with you and/or your care-partner.     Handouts were given to your care partner on polyps, hemorrhoids, diverticulosis, and a high fiber diet with liberal fluid intake. Resume your plavix today.  You may resume your other  current medications today also. Await biopsy results. Please call if any questions or concerns.

## 2014-01-24 NOTE — Op Note (Signed)
[place]  [address] [place], [address]   ENDOSCOPY PROCEDURE REPORT  PATIENT: Ian, Cummings  MR#: [id number] BIRTHDATE: January 23, 1929 , 84  yrs. old GENDER: male ENDOSCOPIST: Eustace Quail, MD REFERRED BY:  Janalyn Rouse, M.D. PROCEDURE DATE:  01/24/2014 PROCEDURE:  EGD, diagnostic ASA CLASS:     Class III INDICATIONS:  heme positive stool and history of esophageal reflux.  MEDICATIONS: Monitored anesthesia care and Propofol 50 mg IV TOPICAL ANESTHETIC: none  DESCRIPTION OF PROCEDURE: After the risks benefits and alternatives of the procedure were thoroughly explained, informed consent was obtained.  The LB [id number] endoscope was introduced through the mouth and advanced to the third portion of the duodenum , Without limitations.  The instrument was slowly withdrawn as the mucosa was fully examined.   EXAM: The esophagus and gastroesophageal junction were completely normal in appearance.  The stomach was entered and closely examined.The antrum, angularis, and lesser curvature were well visualized, including a retroflexed view of the cardia and fundus. The stomach wall was normally distensable.  The scope passed easily through the pylorus into the duodenum.  Retroflexed views revealed a hiatal hernia.     The scope was then withdrawn from the patient and the procedure completed.  COMPLICATIONS: There were no immediate complications.  ENDOSCOPIC IMPRESSION: 1. Normal EGD  RECOMMENDATIONS: 1. Continue current meds . Resume Plavix today. See colonoscopy report  REPEAT EXAM:  eSigned:  Eustace Quail, MD 01/24/2014 4:13 PM    CC:W.  Lutricia Feil, MD and The Patient

## 2014-01-24 NOTE — Op Note (Signed)
[place]  [address] [place], [address]   COLONOSCOPY PROCEDURE REPORT  PATIENT: [name], [name]  MR#: [id number] BIRTHDATE: [birth year] , 84  yrs. old GENDER: male ENDOSCOPIST: Eustace Quail, MD REFERRED BY:W.  Lutricia Feil, M.D. PROCEDURE DATE:  01/24/2014 PROCEDURE:   Colonoscopy with snare polypectomy x15. Extended service for time (greater than 30 minutes) and technical (multiple polyps) First Screening Colonoscopy - Avg.  risk and is 50 yrs.  old or older - No.  Prior Negative Screening - Now for repeat screening. N/A  History of Adenoma - Now for follow-up colonoscopy & has been > or = to 3 yrs.  N/A  Polyps Removed Today? Yes. ASA CLASS:   Class III INDICATIONS:heme-positive stool and hematochezia. MEDICATIONS: Monitored anesthesia care and Propofol 350 mg IV  DESCRIPTION OF PROCEDURE:   After the risks benefits and alternatives of the procedure were thoroughly explained, informed consent was obtained.  The digital rectal exam revealed no abnormalities of the rectum.   The LB [id number]  endoscope was introduced through the anus and advanced to the cecum, which was identified by both the appendix and ileocecal valve. No adverse events experienced.   The quality of the prep was excellent, using MoviPrep  The instrument was then slowly withdrawn as the colon was fully examined.      COLON FINDINGS: 15 polyps measuring 3 - 11 mm in size were found in the ascending colon(4),  the cecum, the transverse colon(3), sigmoid colon(2), and descending colon(5).  A polypectomy was performed with a cold snare.  The resection was complete, the polyp tissue was completely retrieved and sent to histology.   There was moderate diverticulosis noted in the sigmoid colon.   The colon mucosa was otherwise normal.  Retroflexed views revealed internal hemorrhoids. The time to cecum=4 minutes 17 seconds.  Withdrawal time=28 minutes 53 seconds.  The scope was  withdrawn and the procedure completed. COMPLICATIONS: There were no immediate complications.  ENDOSCOPIC IMPRESSION: 1.   15 polyps were found in the colon; polypectomy was performed with a cold snare 2.   Moderate diverticulosis was noted in the sigmoid colon 3.   The colon mucosa was otherwise normal  RECOMMENDATIONS: 1.  Repeat Colonoscopy in 1 year. 2.  Resume Plavix today 3.  Upper endoscopy today (see report)  eSigned:  Eustace Quail, MD 01/24/2014 4:10 PM   cc: Janalyn Rouse, MD and The Patient   PATIENT NAME:  Ian Cummings, Ian Cummings MR#: [id number]

## 2014-01-25 ENCOUNTER — Telehealth: Payer: Self-pay

## 2014-01-25 NOTE — Telephone Encounter (Signed)
  Follow up Call-  Call back number 01/24/2014  Post procedure Call Back phone  # [phone]  Permission to leave phone message Yes     Patient questions:  Do you have a fever, pain , or abdominal swelling? No. Pain Score  0 *  Have you tolerated food without any problems? Yes.    Have you been able to return to your normal activities? Yes.    Do you have any questions about your discharge instructions: Diet   No. Medications  No. Follow up visit  No.  Do you have questions or concerns about your Care? No.  Actions: * If pain score is 4 or above: No action needed, pain <4.   No problems per the pt. maw

## 2014-01-30 ENCOUNTER — Encounter: Payer: Self-pay | Admitting: Internal Medicine

## 2014-02-07 ENCOUNTER — Ambulatory Visit: Payer: Medicare HMO | Admitting: Internal Medicine

## 2014-02-17 ENCOUNTER — Encounter: Payer: Self-pay | Admitting: Family

## 2014-02-20 ENCOUNTER — Ambulatory Visit (INDEPENDENT_AMBULATORY_CARE_PROVIDER_SITE_OTHER): Payer: Commercial Managed Care - HMO | Admitting: Family

## 2014-02-20 ENCOUNTER — Encounter: Payer: Self-pay | Admitting: Family

## 2014-02-20 VITALS — BP 122/66 | HR 52 | Resp 14 | Ht 71.0 in | Wt 168.0 lb

## 2014-02-20 DIAGNOSIS — I70219 Atherosclerosis of native arteries of extremities with intermittent claudication, unspecified extremity: Secondary | ICD-10-CM | POA: Insufficient documentation

## 2014-02-20 DIAGNOSIS — I739 Peripheral vascular disease, unspecified: Secondary | ICD-10-CM

## 2014-02-20 HISTORY — DX: Atherosclerosis of native arteries of extremities with intermittent claudication, unspecified extremity: I70.219

## 2014-02-20 NOTE — Progress Notes (Signed)
[facility] [place] HISTORY AND PHYSICAL   MRN : [id number]  History of Present Illness:   Ian Cummings. is a 80 y.o. male patient of Dr. Trula Slade who is s/p right carotid stenting by Dr. Harvel Ricks at [facility] June 12, 2011.  He also has mild to moderate claudication symptoms in both calves and thighs, right worse than left, no tissue loss.  He is able to do all of his yard work, he can walk 200-300 ft before he cramps in calves and thighs, this has not changed, relieved by rest.  He has bilateral hip pain, lateral aspects, with walking, this pain was relieved for about a week after cortisone injections.  He does not have any non healing ulcers or sores. He has also been seen by Dr. Vicente Masson at [facility] in the past.   He returns today for the following: Dr. Stephens Shire 12/20/12 progress note: If his symptoms have not greatly improved, he will need an arteriogram to determine further interventions. He has had the saphenous veins in both legs removed and would need a gortex graft if in need of lower extremity bypass in the future.  However, since patient is having GI bleed issue, and will be evaluated for that in Sept., 2015, will see pt back in October, 2015 and at that point will see what is the status of his GI bleeding and his claudication (discussed with Dr. Oneida Alar).  He had 15 colon polyps removed in September, 2015, by colonoscopy, and he has had no further GI bleeding. He takes a daily ASA, daily Plavix, no anticoagulants.  He has a history of CABG in 1996 x 4-5 vessels, off pump CABG x 26 September 2012 by Dr. Servando Snare. He has complaints of cramping in his thighs and calves bilaterally when he walks.   He denies any hx of stroke, but states in 2013, he did have an episode of double vision for about an hour and this resolved.  He does have a hx of right carotid stenting by Dr. Harvel Ricks at [facility] June 12, 2011. Per the pt, he states that he does have ~ 70% blockage in the left  carotid artery.  He denies any weakness or numbness in any extremity.  His LE PAD is monitored by our practice.  He is on a statin for his hypercholesterolemia. He is also on a beta blocker for his heart disease/hypertension.  The patient reports New Medical or Surgical History: no.  Pt Diabetic: Pre diabetic  Pt smoker: former smoker, quit in the 1970's   Current Outpatient Prescriptions  Medication Sig Dispense Refill  . allopurinol (ZYLOPRIM) 300 MG tablet Take 300 mg by mouth daily.      . AMBULATORY NON FORMULARY MEDICATION Experimental Drug Evacetrapib or Placebo Take 1 tablet by mouth once daily      . amLODipine (NORVASC) 10 MG tablet Take 10 mg by mouth daily.      Marland Kitchen aspirin 81 MG tablet Take 81 mg by mouth daily.      Marland Kitchen atenolol (TENORMIN) 50 MG tablet Take 50 mg by mouth daily.      . cholecalciferol (VITAMIN D) 1000 UNITS tablet Take 2,000 Units by mouth daily.      . clopidogrel (PLAVIX) 75 MG tablet Take 75 mg by mouth daily.      . fish oil-omega-3 fatty acids 1000 MG capsule Take 1 g by mouth daily.       . hydrochlorothiazide (HYDRODIURIL) 12.5 MG tablet Take 25 mg  by mouth daily.       . lansoprazole (PREVACID) 15 MG capsule Take 15 mg by mouth daily.      Marland Kitchen levothyroxine (SYNTHROID, LEVOTHROID) 88 MCG tablet Take 88 mcg by mouth daily before breakfast.      . multivitamin (RENA-VIT) TABS tablet Take 1 tablet by mouth daily.      Marland Kitchen PARoxetine (PAXIL) 20 MG tablet Take 20 mg by mouth every morning.      . ramipril (ALTACE) 2.5 MG tablet Take 5 mg by mouth 2 (two) times daily.       . rosuvastatin (CRESTOR) 40 MG tablet Take 40 mg by mouth daily.      . vitamin B-12 (CYANOCOBALAMIN) 1000 MCG tablet Take 1,000 mcg by mouth daily.       No current facility-administered medications for this visit.    Past Medical History  Diagnosis Date  . CAD (coronary artery disease)   . Hyperlipidemia   . Hypertension   . Arthritis     Gout  . GERD (gastroesophageal reflux  disease)   . MI (myocardial infarction) 4/14  . Gout   . BPH (benign prostatic hypertrophy)   . Carotid artery disease     Right carotid stent 2013  . TIA (transient ischemic attack)     Hx: of  . H/O hiatal hernia   . Neuropathy     Hx: of in B/L toes  . Stroke   . AAA (abdominal aortic aneurysm)   . Peripheral vascular disease     with claudication  . Anxiety   . Depression   . Pre-diabetes   . Hypothyroidism     Social History History  Substance Use Topics  . Smoking status: Former Smoker    Types: Cigarettes    Quit date: 04/29/1975  . Smokeless tobacco: Never Used  . Alcohol Use: 10.8 - 12.0 oz/week    12 Glasses of wine, 6-8 Shots of liquor per week     Comment: Occasional drink    Family History Family History  Problem Relation Age of Onset  . Heart disease Mother     After 33 yrs of age  . Heart attack Mother   . Hyperlipidemia Father   . Hypertension Father   . Heart disease Father     After 33 yrs of age  . Diabetes Father   . Heart attack Father   . Stroke Sister   . Colon cancer Neg Hx   . Stomach cancer Neg Hx   . Rectal cancer Neg Hx     Surgical History Past Surgical History  Procedure Laterality Date  . Coronary artery bypass graft  1995  . Foot surgery Left   . Coronary angioplasty with stent placement  2010  . Inguinal hernia repair Bilateral     X 2   . Carotid stent insertion Right 2013    right at [facility]  . Tonsillectomy    . Colonoscopy      Hx: of  . Retinal detachment surgery Left     Hx: of left eye  . Cataract extraction Bilateral     Hx: of both eyes  . Fracture surgery Left     Hx: of Left heel  . Coronary artery bypass graft N/A 10/18/2012    Procedure: REDO CORONARY ARTERY BYPASS GRAFTING (CABG);  Surgeon: Grace Isaac, MD;  Location: [place];  Service: Open Heart Surgery;  Laterality: N/A;  off pump times two using endoscopically harvested left saphenous  vein  . Intraoperative transesophageal  echocardiogram N/A 10/18/2012    Procedure: INTRAOPERATIVE TRANSESOPHAGEAL ECHOCARDIOGRAM;  Surgeon: Grace Isaac, MD;  Location: [place];  Service: Open Heart Surgery;  Laterality: N/A;  . Eye surgery    . Mastoidectomy  1933    Allergies  Allergen Reactions  . Metoprolol Itching    Current Outpatient Prescriptions  Medication Sig Dispense Refill  . allopurinol (ZYLOPRIM) 300 MG tablet Take 300 mg by mouth daily.      . AMBULATORY NON FORMULARY MEDICATION Experimental Drug Evacetrapib or Placebo Take 1 tablet by mouth once daily      . amLODipine (NORVASC) 10 MG tablet Take 10 mg by mouth daily.      Marland Kitchen aspirin 81 MG tablet Take 81 mg by mouth daily.      Marland Kitchen atenolol (TENORMIN) 50 MG tablet Take 50 mg by mouth daily.      . cholecalciferol (VITAMIN D) 1000 UNITS tablet Take 2,000 Units by mouth daily.      . clopidogrel (PLAVIX) 75 MG tablet Take 75 mg by mouth daily.      . fish oil-omega-3 fatty acids 1000 MG capsule Take 1 g by mouth daily.       . hydrochlorothiazide (HYDRODIURIL) 12.5 MG tablet Take 25 mg by mouth daily.       . lansoprazole (PREVACID) 15 MG capsule Take 15 mg by mouth daily.      Marland Kitchen levothyroxine (SYNTHROID, LEVOTHROID) 88 MCG tablet Take 88 mcg by mouth daily before breakfast.      . multivitamin (RENA-VIT) TABS tablet Take 1 tablet by mouth daily.      Marland Kitchen PARoxetine (PAXIL) 20 MG tablet Take 20 mg by mouth every morning.      . ramipril (ALTACE) 2.5 MG tablet Take 5 mg by mouth 2 (two) times daily.       . rosuvastatin (CRESTOR) 40 MG tablet Take 40 mg by mouth daily.      . vitamin B-12 (CYANOCOBALAMIN) 1000 MCG tablet Take 1,000 mcg by mouth daily.       No current facility-administered medications for this visit.     REVIEW OF SYSTEMS: See HPI for pertinent positives and negatives.  Physical Examination Filed Vitals:   02/20/14 1011  BP: 122/66  Pulse: 52  Resp: 14  Height: 5\' 11"  (1.803 m)  Weight: 168 lb (76.204 kg)  SpO2: 96%   Body mass  index is 23.44 kg/(m^2).  General: A&O x 3, WDWN.  Gait: normal  Eyes: PERRLA.  Pulmonary: CTAB, without wheezes , rales or rhonchi.  Cardiac: regular Rhythm, bradycardic , without detected murmur.   Carotid Bruits  Left  Right    Negative  Positive    Aorta is not palpable.  Radial pulses: are 2+ palpable and =   VASCULAR EXAM:  Extremities without ischemic changes  without Gangrene; without open wounds.   LE Pulses  LEFT  RIGHT   FEMORAL  palpable  palpable   POPLITEAL  not palpable  not palpable   POSTERIOR TIBIAL  not palpable, monophasic by Doppler  not palpable, not Dopplerable   DORSALIS PEDIS  ANTERIOR TIBIAL  not palpable, monophasic by Doppler  not palpable, monophasic by Doppler   PERONEAL  not Palpable, not Dopplerable  not Palpable, not Dopplerable    Abdomen: soft, NT, no palpated masses.  Skin: no rashes, no ulcers noted.  Musculoskeletal: no muscle wasting or atrophy.  Neurologic: A&O X 3; Appropriate Affect ; SENSATION: normal; MOTOR  FUNCTION: moving all extremities equally, motor strength 5/5 throughout. Speech is fluent/normal. CN 2-12 intact.   ASSESSMENT:  Ian Cummings. is a 80 y.o. male who is s/p right carotid stenting by Dr. Harvel Ricks at [facility] June 12, 2011.  He also has mild to moderate claudication symptoms in both calves and thighs, right worse than left, no tissue loss. He has no recent stroke or TIA symptoms. Carotid Duplex on 12/21/13 indicates a patent right carotid stent and left ICA stenosis at 40-59%. No prior carotid Duplex performed at this facility.  ABI's on 12/21/13 indicated right ABI at <0.40 with monophasic waveforms indicating critical limb ischemia and left ABI at 0.54 with monophasic waveforms indicating severe arterial occlusive disease. He is able to do all of his yard work, he can walk 200-300 ft before he cramps in calves and thighs, this has not changed, relieved by rest.  He has bilateral hip pain, lateral aspects, with  walking, this pain was relieved for about a week after cortisone injections.  He does not have any non healing ulcers or sores. Fortunately he stopped smoking in the 1970's. See Plan.  PLAN:   Graduated walking program and maximal medical management. Based on today's exam and non-invasive vascular lab results, after discussing with Dr. Trula Slade, and since his claudication symptoms are mild to moderate and not worse, not life limiting, the patient will follow up in 1 year with the following tests ABI's and carotid Duplex, see Dr. Trula Slade, he knows to return sooner if needed.  I discussed in depth with the patient the nature of atherosclerosis, and emphasized the importance of maximal medical management including strict control of blood pressure, blood glucose, and lipid levels, obtaining regular exercise, and cessation of smoking.  The patient is aware that without maximal medical management the underlying atherosclerotic disease process will progress, limiting the benefit of any interventions.  The patient was given information about stroke prevention and what symptoms should prompt the patient to seek immediate medical care.  The patient was given information about PAD including signs, symptoms, treatment, what symptoms should prompt the patient to seek immediate medical care, and risk reduction measures to take. Thank you for allowing [place] to participate in this patient's care.  Clemon Chambers, RN, MSN, FNP-C [facility] Office: [phone]  Clinic MD: Trula Slade 02/20/2014 10:27 AM

## 2014-02-20 NOTE — Patient Instructions (Signed)
Stroke Prevention Some health problems and behaviors may make it more likely for you to have a stroke. Below are ways to lessen your risk of having a stroke.   Be active for at least 30 minutes on most or all days.  Do not smoke. Try not to be around others who smoke.  Do not drink too much alcohol.  Do not have more than 2 drinks a day if you are a man.  Do not have more than 1 drink a day if you are a woman and are not pregnant.  Eat healthy foods, such as fruits and vegetables. If you were put on a specific diet, follow the diet as told.  Keep your cholesterol levels under control through diet and medicines. Look for foods that are low in saturated fat, trans fat, cholesterol, and are high in fiber.  If you have diabetes, follow all diet plans and take your medicine as told.  If you have high blood pressure (hypertension), follow all diet plans and take your medicine as told.  Keep a healthy weight. Eat foods that are low in calories, salt, saturated fat, trans fat, and cholesterol.  Do not take drugs.  Avoid birth control pills, if this applies. Talk to your doctor about the risks of taking birth control pills.  Talk to your doctor if you have sleep problems (sleep apnea).  Take all medicine as told by your doctor.  You may be told to take aspirin or blood thinner medicine. Take this medicine as told by your doctor.  Understand your medicine instructions.  Make sure any other conditions you have are being taken care of. GET HELP RIGHT AWAY IF:  You suddenly lose feeling (you feel numb) or have weakness in your face, arm, or leg.  Your face or eyelid hangs down to one side.  You suddenly feel confused.  You have trouble talking (aphasia) or understanding what people are saying.  You suddenly have trouble seeing in one or both eyes.  You suddenly have trouble walking.  You are dizzy.  You lose your balance or your movements are clumsy (uncoordinated).  You  suddenly have a very bad headache and you do not know the cause.  You have new chest pain.  Your heart feels like it is fluttering or skipping a beat (irregular heartbeat). Do not wait to see if the symptoms above go away. Get help right away. Call your local emergency services (911 in [place] Do not drive yourself to the hospital. Document Released: 10/14/2011 Document Revised: 08/29/2013 Document Reviewed: 10/15/2012 [facility] Patient Information 2015 [place], [place]. This information is not intended to replace advice given to you by your health care provider. Make sure you discuss any questions you have with your health care provider.   Peripheral Vascular Disease Peripheral Vascular Disease (PVD), also called Peripheral Arterial Disease (PAD), is a circulation problem caused by cholesterol (atherosclerotic plaque) deposits in the arteries. PVD commonly occurs in the lower extremities (legs) but it can occur in other areas of the body, such as your arms. The cholesterol buildup in the arteries reduces blood flow which can cause pain and other serious problems. The presence of PVD can place a person at risk for Coronary Artery Disease (CAD).  CAUSES  Causes of PVD can be many. It is usually associated with more than one risk factor such as:   High Cholesterol.  Smoking.  Diabetes.  Lack of exercise or inactivity.  High blood pressure (hypertension).  Obesity.  Family history.  SYMPTOMS   When the lower extremities are affected, patients with PVD may experience:  Leg pain with exertion or physical activity. This is called INTERMITTENT CLAUDICATION. This may present as cramping or numbness with physical activity. The location of the pain is associated with the level of blockage. For example, blockage at the abdominal level (distal abdominal aorta) may result in buttock or hip pain. Lower leg arterial blockage may result in calf pain.  As PVD becomes more severe, pain can develop with  less physical activity.  In people with severe PVD, leg pain may occur at rest.  Other PVD signs and symptoms:  Leg numbness or weakness.  Coldness in the affected leg or foot, especially when compared to the other leg.  A change in leg color.  Patients with significant PVD are more prone to ulcers or sores on toes, feet or legs. These may take longer to heal or may reoccur. The ulcers or sores can become infected.  If signs and symptoms of PVD are ignored, gangrene may occur. This can result in the loss of toes or loss of an entire limb.  Not all leg pain is related to PVD. Other medical conditions can cause leg pain such as:  Blood clots (embolism) or Deep Vein Thrombosis.  Inflammation of the blood vessels (vasculitis).  Spinal stenosis. DIAGNOSIS  Diagnosis of PVD can involve several different types of tests. These can include:  Pulse Volume Recording Method (PVR). This test is simple, painless and does not involve the use of X-rays. PVR involves measuring and comparing the blood pressure in the arms and legs. An ABI (Ankle-Brachial Index) is calculated. The normal ratio of blood pressures is 1. As this number becomes smaller, it indicates more severe disease.  < 0.95 - indicates significant narrowing in one or more leg vessels.  <0.8 - there will usually be pain in the foot, leg or buttock with exercise.  <0.4 - will usually have pain in the legs at rest.  <0.25 - usually indicates limb threatening PVD.  Doppler detection of pulses in the legs. This test is painless and checks to see if you have a pulses in your legs/feet.  A dye or contrast material (a substance that highlights the blood vessels so they show up on x-ray) may be given to help your caregiver better see the arteries for the following tests. The dye is eliminated from your body by the kidney's. Your caregiver may order blood work to check your kidney function and other laboratory values before the following  tests are performed:  Magnetic Resonance Angiography (MRA). An MRA is a picture study of the blood vessels and arteries. The MRA machine uses a large magnet to produce images of the blood vessels.  Computed Tomography Angiography (CTA). A CTA is a specialized x-ray that looks at how the blood flows in your blood vessels. An IV may be inserted into your arm so contrast dye can be injected.  Angiogram. Is a procedure that uses x-rays to look at your blood vessels. This procedure is minimally invasive, meaning a small incision (cut) is made in your groin. A small tube (catheter) is then inserted into the artery of your groin. The catheter is guided to the blood vessel or artery your caregiver wants to examine. Contrast dye is injected into the catheter. X-rays are then taken of the blood vessel or artery. After the images are obtained, the catheter is taken out. TREATMENT  Treatment of PVD involves many interventions which may include:  Lifestyle changes:  Quitting smoking.  Exercise.  Following a low fat, low cholesterol diet.  Control of diabetes.  Foot care is very important to the PVD patient. Good foot care can help prevent infection.  Medication:  Cholesterol-lowering medicine.  Blood pressure medicine.  Anti-platelet drugs.  Certain medicines may reduce symptoms of Intermittent Claudication.  Interventional/Surgical options:  Angioplasty. An Angioplasty is a procedure that inflates a balloon in the blocked artery. This opens the blocked artery to improve blood flow.  Stent Implant. A wire mesh tube (stent) is placed in the artery. The stent expands and stays in place, allowing the artery to remain open.  Peripheral Bypass Surgery. This is a surgical procedure that reroutes the blood around a blocked artery to help improve blood flow. This type of procedure may be performed if Angioplasty or stent implants are not an option. SEEK IMMEDIATE MEDICAL CARE IF:   You develop  pain or numbness in your arms or legs.  Your arm or leg turns cold, becomes blue in color.  You develop redness, warmth, swelling and pain in your arms or legs. MAKE SURE YOU:   Understand these instructions.  Will watch your condition.  Will get help right away if you are not doing well or get worse. Document Released: 05/22/2004 Document Revised: 07/07/2011 Document Reviewed: 04/18/2008 [facility] Patient Information 2015 [place], [place]. This information is not intended to replace advice given to you by your health care provider. Make sure you discuss any questions you have with your health care provider.

## 2014-03-17 ENCOUNTER — Encounter: Payer: Self-pay | Admitting: Cardiology

## 2014-04-06 ENCOUNTER — Encounter (HOSPITAL_COMMUNITY): Payer: Self-pay | Admitting: Cardiology

## 2014-05-04 DIAGNOSIS — I252 Old myocardial infarction: Secondary | ICD-10-CM | POA: Diagnosis not present

## 2014-05-04 DIAGNOSIS — I2510 Atherosclerotic heart disease of native coronary artery without angina pectoris: Secondary | ICD-10-CM | POA: Diagnosis not present

## 2014-05-04 DIAGNOSIS — H119 Unspecified disorder of conjunctiva: Secondary | ICD-10-CM | POA: Insufficient documentation

## 2014-05-04 DIAGNOSIS — D3102 Benign neoplasm of left conjunctiva: Secondary | ICD-10-CM | POA: Diagnosis not present

## 2014-05-04 DIAGNOSIS — Z9889 Other specified postprocedural states: Secondary | ICD-10-CM | POA: Diagnosis not present

## 2014-05-04 DIAGNOSIS — E119 Type 2 diabetes mellitus without complications: Secondary | ICD-10-CM | POA: Diagnosis not present

## 2014-05-04 DIAGNOSIS — E785 Hyperlipidemia, unspecified: Secondary | ICD-10-CM | POA: Diagnosis not present

## 2014-05-04 DIAGNOSIS — Z87891 Personal history of nicotine dependence: Secondary | ICD-10-CM | POA: Diagnosis not present

## 2014-05-04 DIAGNOSIS — Z7982 Long term (current) use of aspirin: Secondary | ICD-10-CM | POA: Diagnosis not present

## 2014-05-04 DIAGNOSIS — I10 Essential (primary) hypertension: Secondary | ICD-10-CM | POA: Diagnosis not present

## 2014-05-04 HISTORY — DX: Unspecified disorder of conjunctiva: H11.9

## 2014-05-16 DIAGNOSIS — E039 Hypothyroidism, unspecified: Secondary | ICD-10-CM | POA: Diagnosis not present

## 2014-05-16 DIAGNOSIS — I252 Old myocardial infarction: Secondary | ICD-10-CM | POA: Diagnosis not present

## 2014-05-16 DIAGNOSIS — R7301 Impaired fasting glucose: Secondary | ICD-10-CM | POA: Diagnosis not present

## 2014-05-16 DIAGNOSIS — Z9861 Coronary angioplasty status: Secondary | ICD-10-CM | POA: Diagnosis not present

## 2014-05-16 DIAGNOSIS — E785 Hyperlipidemia, unspecified: Secondary | ICD-10-CM | POA: Diagnosis not present

## 2014-05-16 DIAGNOSIS — I10 Essential (primary) hypertension: Secondary | ICD-10-CM | POA: Diagnosis not present

## 2014-05-16 DIAGNOSIS — Z1389 Encounter for screening for other disorder: Secondary | ICD-10-CM | POA: Diagnosis not present

## 2014-05-16 DIAGNOSIS — Z955 Presence of coronary angioplasty implant and graft: Secondary | ICD-10-CM | POA: Diagnosis not present

## 2014-05-29 DIAGNOSIS — Z48810 Encounter for surgical aftercare following surgery on the sense organs: Secondary | ICD-10-CM | POA: Diagnosis not present

## 2014-05-29 DIAGNOSIS — H119 Unspecified disorder of conjunctiva: Secondary | ICD-10-CM | POA: Diagnosis not present

## 2014-05-29 DIAGNOSIS — Z87891 Personal history of nicotine dependence: Secondary | ICD-10-CM | POA: Diagnosis not present

## 2014-08-31 DIAGNOSIS — G319 Degenerative disease of nervous system, unspecified: Secondary | ICD-10-CM | POA: Diagnosis not present

## 2014-08-31 DIAGNOSIS — I6529 Occlusion and stenosis of unspecified carotid artery: Secondary | ICD-10-CM | POA: Diagnosis not present

## 2014-08-31 DIAGNOSIS — I10 Essential (primary) hypertension: Secondary | ICD-10-CM | POA: Diagnosis not present

## 2014-08-31 DIAGNOSIS — R42 Dizziness and giddiness: Secondary | ICD-10-CM | POA: Diagnosis not present

## 2014-08-31 DIAGNOSIS — I6782 Cerebral ischemia: Secondary | ICD-10-CM | POA: Diagnosis not present

## 2014-08-31 DIAGNOSIS — R262 Difficulty in walking, not elsewhere classified: Secondary | ICD-10-CM | POA: Diagnosis not present

## 2014-08-31 DIAGNOSIS — Z6822 Body mass index (BMI) 22.0-22.9, adult: Secondary | ICD-10-CM | POA: Diagnosis not present

## 2014-08-31 DIAGNOSIS — Z79899 Other long term (current) drug therapy: Secondary | ICD-10-CM | POA: Diagnosis not present

## 2014-10-03 DIAGNOSIS — Z951 Presence of aortocoronary bypass graft: Secondary | ICD-10-CM | POA: Diagnosis not present

## 2014-10-03 DIAGNOSIS — I2510 Atherosclerotic heart disease of native coronary artery without angina pectoris: Secondary | ICD-10-CM | POA: Diagnosis not present

## 2014-10-03 DIAGNOSIS — I70219 Atherosclerosis of native arteries of extremities with intermittent claudication, unspecified extremity: Secondary | ICD-10-CM | POA: Diagnosis not present

## 2014-10-03 DIAGNOSIS — E785 Hyperlipidemia, unspecified: Secondary | ICD-10-CM | POA: Diagnosis not present

## 2014-10-03 DIAGNOSIS — I25719 Atherosclerosis of autologous vein coronary artery bypass graft(s) with unspecified angina pectoris: Secondary | ICD-10-CM | POA: Diagnosis not present

## 2014-10-03 DIAGNOSIS — I6523 Occlusion and stenosis of bilateral carotid arteries: Secondary | ICD-10-CM | POA: Diagnosis not present

## 2014-10-11 DIAGNOSIS — D696 Thrombocytopenia, unspecified: Secondary | ICD-10-CM | POA: Diagnosis not present

## 2014-12-04 DIAGNOSIS — R7301 Impaired fasting glucose: Secondary | ICD-10-CM | POA: Diagnosis not present

## 2014-12-04 DIAGNOSIS — M109 Gout, unspecified: Secondary | ICD-10-CM | POA: Diagnosis not present

## 2014-12-04 DIAGNOSIS — E785 Hyperlipidemia, unspecified: Secondary | ICD-10-CM | POA: Diagnosis not present

## 2014-12-04 DIAGNOSIS — I10 Essential (primary) hypertension: Secondary | ICD-10-CM | POA: Diagnosis not present

## 2014-12-04 DIAGNOSIS — E039 Hypothyroidism, unspecified: Secondary | ICD-10-CM | POA: Diagnosis not present

## 2014-12-05 DIAGNOSIS — I059 Rheumatic mitral valve disease, unspecified: Secondary | ICD-10-CM | POA: Insufficient documentation

## 2014-12-05 DIAGNOSIS — I129 Hypertensive chronic kidney disease with stage 1 through stage 4 chronic kidney disease, or unspecified chronic kidney disease: Secondary | ICD-10-CM | POA: Insufficient documentation

## 2014-12-08 DIAGNOSIS — Z0000 Encounter for general adult medical examination without abnormal findings: Secondary | ICD-10-CM | POA: Diagnosis not present

## 2014-12-08 DIAGNOSIS — I252 Old myocardial infarction: Secondary | ICD-10-CM | POA: Diagnosis not present

## 2014-12-08 DIAGNOSIS — Z9861 Coronary angioplasty status: Secondary | ICD-10-CM | POA: Diagnosis not present

## 2014-12-08 DIAGNOSIS — R7301 Impaired fasting glucose: Secondary | ICD-10-CM | POA: Diagnosis not present

## 2014-12-08 DIAGNOSIS — E785 Hyperlipidemia, unspecified: Secondary | ICD-10-CM | POA: Diagnosis not present

## 2014-12-08 DIAGNOSIS — Z955 Presence of coronary angioplasty implant and graft: Secondary | ICD-10-CM | POA: Diagnosis not present

## 2014-12-08 DIAGNOSIS — I10 Essential (primary) hypertension: Secondary | ICD-10-CM | POA: Diagnosis not present

## 2014-12-08 DIAGNOSIS — I739 Peripheral vascular disease, unspecified: Secondary | ICD-10-CM | POA: Diagnosis not present

## 2014-12-11 DIAGNOSIS — Z1212 Encounter for screening for malignant neoplasm of rectum: Secondary | ICD-10-CM | POA: Diagnosis not present

## 2014-12-19 ENCOUNTER — Emergency Department (HOSPITAL_COMMUNITY)
Admission: EM | Admit: 2014-12-19 | Discharge: 2014-12-19 | Disposition: A | Discharge status: Home / Self Care | Payer: Commercial Managed Care - HMO | Attending: Emergency Medicine | Admitting: Emergency Medicine

## 2014-12-19 ENCOUNTER — Emergency Department (HOSPITAL_COMMUNITY): Payer: Commercial Managed Care - HMO

## 2014-12-19 ENCOUNTER — Encounter (HOSPITAL_COMMUNITY): Payer: Self-pay | Admitting: Emergency Medicine

## 2014-12-19 ENCOUNTER — Other Ambulatory Visit: Payer: Self-pay | Admitting: *Deleted

## 2014-12-19 DIAGNOSIS — E039 Hypothyroidism, unspecified: Secondary | ICD-10-CM | POA: Insufficient documentation

## 2014-12-19 DIAGNOSIS — E785 Hyperlipidemia, unspecified: Secondary | ICD-10-CM | POA: Diagnosis not present

## 2014-12-19 DIAGNOSIS — Z79899 Other long term (current) drug therapy: Secondary | ICD-10-CM | POA: Diagnosis not present

## 2014-12-19 DIAGNOSIS — R079 Chest pain, unspecified: Secondary | ICD-10-CM | POA: Insufficient documentation

## 2014-12-19 DIAGNOSIS — Z8659 Personal history of other mental and behavioral disorders: Secondary | ICD-10-CM | POA: Insufficient documentation

## 2014-12-19 DIAGNOSIS — Z8673 Personal history of transient ischemic attack (TIA), and cerebral infarction without residual deficits: Secondary | ICD-10-CM | POA: Diagnosis not present

## 2014-12-19 DIAGNOSIS — Z7982 Long term (current) use of aspirin: Secondary | ICD-10-CM | POA: Insufficient documentation

## 2014-12-19 DIAGNOSIS — Z87438 Personal history of other diseases of male genital organs: Secondary | ICD-10-CM | POA: Diagnosis not present

## 2014-12-19 DIAGNOSIS — M109 Gout, unspecified: Secondary | ICD-10-CM | POA: Diagnosis not present

## 2014-12-19 DIAGNOSIS — I739 Peripheral vascular disease, unspecified: Secondary | ICD-10-CM

## 2014-12-19 DIAGNOSIS — I10 Essential (primary) hypertension: Secondary | ICD-10-CM | POA: Diagnosis not present

## 2014-12-19 DIAGNOSIS — Z87891 Personal history of nicotine dependence: Secondary | ICD-10-CM | POA: Diagnosis not present

## 2014-12-19 DIAGNOSIS — I252 Old myocardial infarction: Secondary | ICD-10-CM | POA: Diagnosis not present

## 2014-12-19 DIAGNOSIS — I2510 Atherosclerotic heart disease of native coronary artery without angina pectoris: Secondary | ICD-10-CM | POA: Insufficient documentation

## 2014-12-19 DIAGNOSIS — Z951 Presence of aortocoronary bypass graft: Secondary | ICD-10-CM | POA: Diagnosis not present

## 2014-12-19 DIAGNOSIS — K219 Gastro-esophageal reflux disease without esophagitis: Secondary | ICD-10-CM | POA: Insufficient documentation

## 2014-12-19 DIAGNOSIS — R0789 Other chest pain: Secondary | ICD-10-CM | POA: Diagnosis not present

## 2014-12-19 LAB — CBC WITH DIFFERENTIAL/PLATELET
Basophils Absolute: 0 10*3/uL (ref 0.0–0.1)
Basophils Relative: 0 % (ref 0–1)
Eosinophils Absolute: 0.3 10*3/uL (ref 0.0–0.7)
Eosinophils Relative: 5 % (ref 0–5)
HCT: 38.8 % — ABNORMAL LOW (ref 39.0–52.0)
HEMOGLOBIN: 13.6 g/dL (ref 13.0–17.0)
LYMPHS ABS: 1.5 10*3/uL (ref 0.7–4.0)
LYMPHS PCT: 27 % (ref 12–46)
MCH: 32 pg (ref 26.0–34.0)
MCHC: 35.1 g/dL (ref 30.0–36.0)
MCV: 91.3 fL (ref 78.0–100.0)
Monocytes Absolute: 0.5 10*3/uL (ref 0.1–1.0)
Monocytes Relative: 9 % (ref 3–12)
NEUTROS ABS: 3.5 10*3/uL (ref 1.7–7.7)
NEUTROS PCT: 59 % (ref 43–77)
Platelets: 165 10*3/uL (ref 150–400)
RBC: 4.25 MIL/uL (ref 4.22–5.81)
RDW: 13.4 % (ref 11.5–15.5)
WBC: 5.8 10*3/uL (ref 4.0–10.5)

## 2014-12-19 LAB — COMPREHENSIVE METABOLIC PANEL
ALT: 26 U/L (ref 17–63)
AST: 34 U/L (ref 15–41)
Albumin: 3.8 g/dL (ref 3.5–5.0)
Alkaline Phosphatase: 39 U/L (ref 38–126)
Anion gap: 10 (ref 5–15)
BUN: 23 mg/dL — ABNORMAL HIGH (ref 6–20)
CHLORIDE: 103 mmol/L (ref 101–111)
CO2: 25 mmol/L (ref 22–32)
Calcium: 9.3 mg/dL (ref 8.9–10.3)
Creatinine, Ser: 1.33 mg/dL — ABNORMAL HIGH (ref 0.61–1.24)
GFR, EST AFRICAN AMERICAN: 55 mL/min — AB (ref >60)
GFR, EST NON AFRICAN AMERICAN: 48 mL/min — AB (ref >60)
Glucose, Bld: 99 mg/dL (ref 65–99)
POTASSIUM: 3.9 mmol/L (ref 3.5–5.1)
Sodium: 138 mmol/L (ref 135–145)
Total Bilirubin: 0.8 mg/dL (ref 0.3–1.2)
Total Protein: 7.8 g/dL (ref 6.5–8.1)

## 2014-12-19 LAB — I-STAT TROPONIN, ED: TROPONIN I, POC: 0 ng/mL (ref 0.00–0.08)

## 2014-12-19 MED ORDER — HYDROCODONE-ACETAMINOPHEN 5-325 MG PO TABS: 1.0000 | ORAL_TABLET | ORAL | Status: DC | PRN

## 2014-12-19 NOTE — Discharge Instructions (Signed)

## 2014-12-19 NOTE — ED Notes (Signed)
Sharp pain started yesterday; was unsure in angina or pulled muscle. BP elevated today and he decided he needed to come in. Says sharp pains come every five minutes; denies any other symptoms. Took 2 nitros that did seem to help make him feel less tight. No aspirin taken yet except baby aspirin this morning.

## 2014-12-19 NOTE — ED Notes (Signed)
Pt placed in gown and in bed. Pt monitored by pulse ox, bp cuff, and 12-lead. 

## 2014-12-19 NOTE — ED Provider Notes (Signed)
CSN: [id number]     Arrival date & time 12/19/14  1309 History   First MD Initiated Contact with Patient 12/19/14 1354     Chief Complaint  Patient presents with  . Chest Pain     (Consider location/radiation/quality/duration/timing/severity/associated sxs/prior Treatment) HPI Patient states that he has been getting an intermittent sharp pain in the left side of his chest since yesterday. He describes it as a sharp radiating pain that starts low on the left chest and radiates up several inches. He states it is coming on a regular time to basis. It seems to come every 2-5 minutes. This is occurring at rest. Symptoms are not exacerbated or worse with activity. No associated shortness of breath. No associated nausea or diaphoresis. No recent fever chills or cough. Patient took 2 nitroglycerin but did not experience significant improvement. These episodes continue to occur. No lower started swelling or pain. Past Medical History  Diagnosis Date  . CAD (coronary artery disease)   . Hyperlipidemia   . Hypertension   . GERD (gastroesophageal reflux disease)   . MI (myocardial infarction) 4/14  . Gout   . BPH (benign prostatic hypertrophy)   . Carotid artery disease     Right carotid stent 2013  . TIA (transient ischemic attack)     Hx: of  . H/O hiatal hernia   . Neuropathy     Hx: of in B/L toes  . Stroke   . AAA (abdominal aortic aneurysm)   . Peripheral vascular disease     with claudication  . Anxiety   . Depression   . Pre-diabetes   . Hypothyroidism    Past Surgical History  Procedure Laterality Date  . Coronary artery bypass graft  1995  . Foot surgery Left   . Coronary angioplasty with stent placement  2010  . Inguinal hernia repair Bilateral     X 2   . Carotid stent insertion Right 2013    right [facility]  . Tonsillectomy    . Colonoscopy      Hx: of  . Retinal detachment surgery Left     Hx: of left eye  . Cataract extraction Bilateral     Hx: of both  eyes  . Fracture surgery Left     Hx: of Left heel  . Coronary artery bypass graft N/A 10/18/2012    Procedure: REDO CORONARY ARTERY BYPASS GRAFTING (CABG);  Surgeon: Grace Isaac, MD;  Location: [facility];  Service: Open Heart Surgery;  Laterality: N/A;  off pump times two using endoscopically harvested left saphenous vein  . Intraoperative transesophageal echocardiogram N/A 10/18/2012    Procedure: INTRAOPERATIVE TRANSESOPHAGEAL ECHOCARDIOGRAM;  Surgeon: Grace Isaac, MD;  Location: [facility];  Service: Open Heart Surgery;  Laterality: N/A;  . Eye surgery    . Mastoidectomy  1933  . Left heart catheterization with coronary/graft angiogram N/A 02/02/2013    Procedure: LEFT HEART CATHETERIZATION WITH Beatrix Fetters;  Surgeon: Jacolyn Reedy, MD;  Location: [facility] CATH LAB;  Service: Cardiovascular;  Laterality: N/A;   Family History  Problem Relation Age of Onset  . Heart disease Mother     After 18 yrs of age  . Heart attack Mother   . Hyperlipidemia Father   . Hypertension Father   . Heart disease Father     After 38 yrs of age  . Diabetes Father   . Heart attack Father   . Stroke Sister   . Colon cancer Neg Hx   .  Stomach cancer Neg Hx   . Rectal cancer Neg Hx    Social History  Substance Use Topics  . Smoking status: Former Smoker    Types: Cigarettes    Quit date: 04/29/1975  . Smokeless tobacco: Never Used  . Alcohol Use: 10.8 - 12.0 oz/week    12 Glasses of wine, 6-8 Shots of liquor per week     Comment: Occasional drink    Review of Systems 10 Systems reviewed and are negative for acute change except as noted in the HPI.    Allergies  Metoprolol  Home Medications   Prior to Admission medications   Medication Sig Start Date End Date Taking? Authorizing Provider  allopurinol (ZYLOPRIM) 300 MG tablet Take 300 mg by mouth daily.   Yes Historical Provider, MD  amLODipine (NORVASC) 10 MG tablet Take 10 mg by mouth daily.   Yes Historical Provider, MD   aspirin 81 MG tablet Take 81 mg by mouth daily.   Yes Historical Provider, MD  atenolol (TENORMIN) 50 MG tablet Take 50 mg by mouth daily.   Yes Historical Provider, MD  cholecalciferol (VITAMIN D) 1000 UNITS tablet Take 2,000 Units by mouth daily.   Yes Historical Provider, MD  clopidogrel (PLAVIX) 75 MG tablet Take 75 mg by mouth daily.   Yes Historical Provider, MD  fish oil-omega-3 fatty acids 1000 MG capsule Take 1 g by mouth daily.    Yes Historical Provider, MD  hydrochlorothiazide (HYDRODIURIL) 12.5 MG tablet Take 25 mg by mouth daily.  05/12/13  Yes Historical Provider, MD  lansoprazole (PREVACID) 15 MG capsule Take 15 mg by mouth daily.   Yes Historical Provider, MD  levothyroxine (SYNTHROID, LEVOTHROID) 88 MCG tablet Take 88 mcg by mouth daily before breakfast.   Yes Historical Provider, MD  lisinopril (PRINIVIL,ZESTRIL) 20 MG tablet Take 20 mg by mouth daily.   Yes Historical Provider, MD  multivitamin (RENA-VIT) TABS tablet Take 1 tablet by mouth daily.   Yes Historical Provider, MD  PARoxetine (PAXIL) 20 MG tablet Take 20 mg by mouth every morning.   Yes Historical Provider, MD  ramipril (ALTACE) 2.5 MG tablet Take 2.5 mg by mouth daily.    Yes Historical Provider, MD  rosuvastatin (CRESTOR) 40 MG tablet Take 40 mg by mouth daily.   Yes Historical Provider, MD  vitamin B-12 (CYANOCOBALAMIN) 1000 MCG tablet Take 1,000 mcg by mouth daily.   Yes Historical Provider, MD  AMBULATORY NON FORMULARY MEDICATION Experimental Drug Evacetrapib or Placebo Take 1 tablet by mouth once daily    Historical Provider, MD   BP 166/62 mmHg  Pulse 57  Temp(Src) 98.4 F (36.9 C) (Oral)  Resp 12  SpO2 95% Physical Exam  Constitutional: He is oriented to person, place, and time. He appears well-developed and well-nourished.  HENT:  Head: Normocephalic and atraumatic.  Eyes: EOM are normal. Pupils are equal, round, and reactive to light.  Neck: Neck supple.  Cardiovascular: Normal rate, regular  rhythm, normal heart sounds and intact distal pulses.   Pulmonary/Chest: Effort normal and breath sounds normal.  Abdominal: Soft. Bowel sounds are normal. He exhibits no distension. There is no tenderness.  Musculoskeletal: Normal range of motion. He exhibits no edema.  Neurological: He is alert and oriented to person, place, and time. He has normal strength. Coordination normal. GCS eye subscore is 4. GCS verbal subscore is 5. GCS motor subscore is 6.  Skin: Skin is warm, dry and intact.  Psychiatric: He has a normal mood and affect.  ED Course  Procedures (including critical care time) Labs Review Labs Reviewed  COMPREHENSIVE METABOLIC PANEL - Abnormal; Notable for the following:    BUN 23 (*)    Creatinine, Ser 1.33 (*)    GFR calc non Af Amer 48 (*)    GFR calc Af Amer 55 (*)    All other components within normal limits  CBC WITH DIFFERENTIAL/PLATELET - Abnormal; Notable for the following:    HCT 38.8 (*)    All other components within normal limits  I-STAT TROPOININ, ED    Imaging Review Dg Chest 2 View  12/19/2014   CLINICAL DATA:  Sharp pain started yesterday; was unsure of angina or pulled muscle. BP elevated today and he decided he needed to come in. Says sharp pains come every five minutes; denies any other symptoms.  EXAM: CHEST  2 VIEW  COMPARISON:  01/25/2013  FINDINGS: There stable changes from previous CABG surgery. There are 2 coronary artery stents. Heart is borderline enlarged. No mediastinal or hilar masses or evidence of adenopathy.  Mild increased bronchovascular interstitial markings in the bases, stable. No lung consolidation or edema. No pleural effusion or pneumothorax.  Bony thorax is demineralized but grossly intact.  IMPRESSION: No acute cardiopulmonary disease.   Electronically Signed   By: Lajean Manes M.D.   On: 12/19/2014 14:31   I have personally reviewed and evaluated these images and lab results as part of my medical decision-making.   EKG  Interpretation   Date/Time:  Tuesday December 19 2014 13:12:12 EDT Ventricular Rate:  64 PR Interval:  158 QRS Duration: 102 QT Interval:  448 QTC Calculation: 462 R Axis:   -17 Text Interpretation:  Normal sinus rhythm Incomplete right bundle branch  block Borderline ECG agree. no change from old Confirmed by Johnney Killian, MD,  Jeannie Done [phone]) on 12/19/2014 3:32:48 PM      MDM   Final diagnoses:  Chest pain, unspecified chest pain type   Patient with a spasmodic, lancinating quality Sharp chest pain. It is limited to a small portion of the patient's chest wall. At this point in time this does not sound typical of cardiac ischemic type of pain. This is been going on for over 24 hours and there is no abnormal finding on EKG or cardiac enzymes. Patient had a cardiac catheterization in 2014 that showed widely patent bypass grafts. At this time I have a higher suspicion for chest wall pain. Patient is also counseled on the possibility of developing shingles. He is advised to observe for any development of rash. Currently I will have the patient treated for pain and follow-up with his family doctor or cardiologist this week for recheck.    Charlesetta Shanks, MD 12/19/14 1536

## 2015-01-12 ENCOUNTER — Encounter: Payer: Self-pay | Admitting: Surgery

## 2015-01-15 ENCOUNTER — Ambulatory Visit (HOSPITAL_COMMUNITY)
Admission: RE | Admit: 2015-01-15 | Discharge: 2015-01-15 | Disposition: A | Discharge status: Home / Self Care | Payer: Commercial Managed Care - HMO | Source: Ambulatory Visit | Attending: Surgery | Admitting: Surgery

## 2015-01-15 ENCOUNTER — Ambulatory Visit (INDEPENDENT_AMBULATORY_CARE_PROVIDER_SITE_OTHER): Payer: Commercial Managed Care - HMO | Admitting: Surgery

## 2015-01-15 ENCOUNTER — Other Ambulatory Visit: Payer: Self-pay

## 2015-01-15 ENCOUNTER — Encounter: Payer: Self-pay | Admitting: Surgery

## 2015-01-15 VITALS — BP 144/70 | HR 54 | Temp 97.8°F | Resp 14 | Ht 71.0 in | Wt 169.4 lb

## 2015-01-15 DIAGNOSIS — I739 Peripheral vascular disease, unspecified: Secondary | ICD-10-CM | POA: Diagnosis not present

## 2015-01-15 DIAGNOSIS — I10 Essential (primary) hypertension: Secondary | ICD-10-CM | POA: Insufficient documentation

## 2015-01-15 DIAGNOSIS — E785 Hyperlipidemia, unspecified: Secondary | ICD-10-CM | POA: Insufficient documentation

## 2015-01-15 DIAGNOSIS — I70219 Atherosclerosis of native arteries of extremities with intermittent claudication, unspecified extremity: Secondary | ICD-10-CM

## 2015-01-15 DIAGNOSIS — R7309 Other abnormal glucose: Secondary | ICD-10-CM | POA: Diagnosis not present

## 2015-01-15 NOTE — Progress Notes (Signed)
History of Present Illness  Ian Cummings. is a 81 y.o. (Sep 09, 1928) male who presents with chief complaint: bilateral calf and thigh pain, right worse than left.  The patient's symptoms have progressed over the last 6 or 7 months. He was last seen in the office 02/20/14 where it was recommended that he initiate a walking program. This has not been successful and is able to ambulate less than 100 feet before experiencing bilateral calf and hip claudication. The patient was previously able to ambulate 200-300 feet before having claudication symptoms. He believes his hip pain is secondary to arthritis. He also complains of pain in his right leg and foot at night. The patient's treatment regimen currently included: maximal medical management with aspirin, plavix and a statin. He is not a smoker.   On ROS today: he complains of a tender sore on his right fifth toe that has been present for the past month. He says it is neither worsening or improving.  All other review of systems are negative  He reports feeling "great" from the "waist up" since his redo CABG in 2014 and remains active with yard work. He is frustrated that his leg pain is limiting him from pursuing his hobbies.     Physical Examination  Filed Vitals:   01/15/15 1407  BP: 144/70  Pulse: 54  Temp: 97.8 F (36.6 C)  TempSrc: Oral  Resp: 14  Height: 5\' 11"  (1.803 m)  Weight: 169 lb 6.4 oz (76.839 kg)  SpO2: 97%   Body mass index is 23.64 kg/(m^2).  General: A&O x 3, WDWN male in NAD  Pulmonary: Sym exp, good air movt, CTAB, no rales, rhonchi, & wheezing  Cardiac: RRR, Nl S1, S2, no Murmurs, rubs or gallops  Vascular: 2+ femoral pulses b/l, non palpable popliteal and pedal pulses b/l; right fifth toe with small superficial blister. No drainage, no cellulitis, no gangrenous changes.   Musculoskeletal: No muscle wasting or atrophy.   Neurologic: No focal deficits.   Non-Invasive Vascular Imaging, reviewed and  interpreted ABI (Date: 01/15/2015)  R: 0.2  L: 0.62  Medical Decision Making  Ian Cummings. is a 81 y.o. male who presents with:  Worsening bilateral leg intermittent claudication, right greater than left and right lower extremity rest pain. He does have a nonhealing superficial blister of his right 5th toe.   The patient will need an arteriogram to evaluate revascularization options.   The procedure, risks, and benefits were discussed at length with the patient and he is willing to proceed.   He will be scheduled for an arteriogram with bilateral runoff and possible intervention on 01/23/15 with Dr. Trula Slade.   He understands that if there are no percutaneous revascularization options, he may require bypass surgery. He has a history of bilateral saphenous vein harvest.   He is on maximal medical management with aspirin, plavix and statin. He is not a smoker.   He knows to contact the office sooner if he symptoms worsen before his procedure.    Ian Jock, PA-C [facility] Office: [phone] Pager: [phone]  01/15/2015, 2:47 PM   This patient was seen in conjunction with Dr. Trula Slade.   I agree with the above.  The patient has had progression of his claudication symptoms to where he can no longer tolerate his quality of life.  His right leg bothers him more so than the left.  ABIs today were performed which were 0.23 on the right and  0.62 on the left.  We discussed proceeding with angiography.  I'll plan on cannulating the left groin with plans for intervention on the right leg if possible.  This is been scheduled for Tuesday, September 27   Ian Cummings

## 2015-01-23 ENCOUNTER — Other Ambulatory Visit: Payer: Self-pay | Admitting: *Deleted

## 2015-01-23 ENCOUNTER — Encounter (HOSPITAL_COMMUNITY): Payer: Self-pay | Admitting: Surgery

## 2015-01-23 ENCOUNTER — Ambulatory Visit (HOSPITAL_COMMUNITY)
Admission: RE | Admit: 2015-01-23 | Discharge: 2015-01-23 | Disposition: A | Discharge status: Home / Self Care | Payer: Commercial Managed Care - HMO | Source: Ambulatory Visit | Attending: Surgery | Admitting: Surgery

## 2015-01-23 ENCOUNTER — Encounter (HOSPITAL_COMMUNITY)
Admission: RE | Disposition: A | Discharge status: Home / Self Care | Payer: Commercial Managed Care - HMO | Source: Ambulatory Visit | Attending: Surgery

## 2015-01-23 DIAGNOSIS — Z7982 Long term (current) use of aspirin: Secondary | ICD-10-CM | POA: Diagnosis not present

## 2015-01-23 DIAGNOSIS — Z7902 Long term (current) use of antithrombotics/antiplatelets: Secondary | ICD-10-CM | POA: Diagnosis not present

## 2015-01-23 DIAGNOSIS — I70213 Atherosclerosis of native arteries of extremities with intermittent claudication, bilateral legs: Secondary | ICD-10-CM | POA: Insufficient documentation

## 2015-01-23 DIAGNOSIS — Z951 Presence of aortocoronary bypass graft: Secondary | ICD-10-CM | POA: Insufficient documentation

## 2015-01-23 DIAGNOSIS — Z01810 Encounter for preprocedural cardiovascular examination: Secondary | ICD-10-CM

## 2015-01-23 DIAGNOSIS — I70209 Unspecified atherosclerosis of native arteries of extremities, unspecified extremity: Secondary | ICD-10-CM

## 2015-01-23 HISTORY — PX: PERIPHERAL VASCULAR CATHETERIZATION: SHX172C

## 2015-01-23 LAB — POCT I-STAT, CHEM 8
BUN: 27 mg/dL — AB (ref 6–20)
CHLORIDE: 106 mmol/L (ref 101–111)
Calcium, Ion: 1.19 mmol/L (ref 1.13–1.30)
Creatinine, Ser: 1.1 mg/dL (ref 0.61–1.24)
Glucose, Bld: 116 mg/dL — ABNORMAL HIGH (ref 65–99)
HEMATOCRIT: 39 % (ref 39.0–52.0)
Hemoglobin: 13.3 g/dL (ref 13.0–17.0)
Potassium: 4.1 mmol/L (ref 3.5–5.1)
SODIUM: 141 mmol/L (ref 135–145)
TCO2: 24 mmol/L (ref 0–100)

## 2015-01-23 SURGERY — ABDOMINAL AORTOGRAM

## 2015-01-23 MED ORDER — MIDAZOLAM HCL 2 MG/2ML IJ SOLN
INTRAMUSCULAR | Status: DC | PRN
Start: 2015-01-23 — End: 2015-01-23
  Administered 2015-01-23 (×2): 1 mg via INTRAVENOUS

## 2015-01-23 MED ORDER — ACETAMINOPHEN 325 MG RE SUPP: 325.0000 mg | RECTAL | Status: DC | PRN

## 2015-01-23 MED ORDER — FENTANYL CITRATE (PF) 100 MCG/2ML IJ SOLN
INTRAMUSCULAR | Status: DC | PRN
  Administered 2015-01-23 (×2): 25 ug via INTRAVENOUS

## 2015-01-23 MED ORDER — MIDAZOLAM HCL 2 MG/2ML IJ SOLN
INTRAMUSCULAR | Status: AC
  Filled 2015-01-23: qty 4

## 2015-01-23 MED ORDER — SODIUM CHLORIDE 0.9 % IV SOLN: 1.0000 mL/kg/h | INTRAVENOUS | Status: DC

## 2015-01-23 MED ORDER — LIDOCAINE HCL (PF) 1 % IJ SOLN
INTRAMUSCULAR | Status: AC
  Filled 2015-01-23: qty 30

## 2015-01-23 MED ORDER — LIDOCAINE HCL (PF) 1 % IJ SOLN
INTRAMUSCULAR | Status: DC | PRN
  Administered 2015-01-23: 12 mL

## 2015-01-23 MED ORDER — HEPARIN (PORCINE) IN NACL 2-0.9 UNIT/ML-% IJ SOLN
INTRAMUSCULAR | Status: AC
  Filled 2015-01-23: qty 1000

## 2015-01-23 MED ORDER — ACETAMINOPHEN 325 MG PO TABS: 325.0000 mg | ORAL_TABLET | ORAL | Status: DC | PRN

## 2015-01-23 MED ORDER — FENTANYL CITRATE (PF) 100 MCG/2ML IJ SOLN
INTRAMUSCULAR | Status: AC
  Filled 2015-01-23: qty 4

## 2015-01-23 MED ORDER — SODIUM CHLORIDE 0.9 % IV SOLN
INTRAVENOUS | Status: DC
  Administered 2015-01-23: 08:00:00 via INTRAVENOUS

## 2015-01-23 SURGICAL SUPPLY — 15 items
CATH ANGIO 5F BER2 65CM (CATHETERS) ×1 IMPLANT
CATH ANGIO 5F PIGTAIL 65CM (CATHETERS) ×1 IMPLANT
CATH CROSS OVER TEMPO 5F (CATHETERS) ×1 IMPLANT
CATH STRAIGHT 5FR 65CM (CATHETERS) ×1 IMPLANT
COVER PRB 48X5XTLSCP FOLD TPE (BAG) IMPLANT
COVER PROBE 5X48 (BAG) ×2
KIT MICROINTRODUCER STIFF 5F (SHEATH) ×1 IMPLANT
KIT PV (KITS) ×2 IMPLANT
SHEATH PINNACLE 5F 10CM (SHEATH) ×1 IMPLANT
SHIELD RADPAD SCOOP 12X17 (MISCELLANEOUS) ×1 IMPLANT
SYR MEDRAD MARK V 150ML (SYRINGE) ×2 IMPLANT
TRANSDUCER W/STOPCOCK (MISCELLANEOUS) ×2 IMPLANT
TRAY PV CATH (CUSTOM PROCEDURE TRAY) ×2 IMPLANT
TUBING CIL FLEX 10 FLL-RA (TUBING) ×1 IMPLANT
WIRE BENTSON .035X145CM (WIRE) ×1 IMPLANT

## 2015-01-23 NOTE — Progress Notes (Signed)
Order for sheath removal verified per post procedural orders. Procedure explained to patient and Lt femoral artery access site assessed: level 0, audible dorsalis pedis and posterior tibial pulses via Doppler. 5 [place] Sheath removed at 10:55 am and manual pressure applied for 20 minutes. Pre, peri, & post procedural vitals: HR 45-50's, RR 15, O2 Sat lower 90's, BP 150-170/45-50 mm Hg, Pain 0. Distal pulses remained intact after sheath removal. Access site level 0 and dressed with 4X4 gauze and tegaderm.  Lavella Lemons, RN confirmed condition of site. Post procedural instructions discussed and return demonstration from patient.

## 2015-01-23 NOTE — Discharge Instructions (Signed)

## 2015-01-23 NOTE — Interval H&P Note (Signed)
History and Physical Interval Note:  01/23/2015 8:59 AM  [name].  has presented today for surgery, with the diagnosis of pvd w/ bialteral lower extremity claudication  The various methods of treatment have been discussed with the patient and family. After consideration of risks, benefits and other options for treatment, the patient has consented to  Procedure(s): Abdominal Aortogram (N/A) as a surgical intervention .  The patient's history has been reviewed, patient examined, no change in status, stable for surgery.  I have reviewed the patient's chart and labs.  Questions were answered to the patient's satisfaction.     Ian Cummings

## 2015-01-23 NOTE — Op Note (Signed)
Patient name: Ian Cummings. MRN: [id number] DOB: [birth year] Sex: male  01/23/2015 Pre-operative Diagnosis: bilateral claudication, right greater than left Post-operative diagnosis:  Same Surgeon:  Annamarie Major Procedure Performed:  1.  Ultrasound-guided access, left femoral artery  2.  Abdominal aortogram  3.  Bilateral lower extremity runoff  4.  Second order catheterization    Indications:  I have been following the patient for bilateral claudication, right greater than left.  His symptoms have been medically managed until recently.  We discussed proceeding with angiography to better define his anatomy and possibly intervene.  Procedure:  The patient was identified in the holding area and taken to room 8.  The patient was then placed supine on the table and prepped and draped in the usual sterile fashion.  A time out was called.  Ultrasound was used to evaluate the left common femoral artery.  It was patent .  A digital ultrasound image was acquired.  A micropuncture needle was used to access the left common femoral artery under ultrasound guidance.  An 018 wire was advanced without resistance and a micropuncture sheath was placed.  The 018 wire was removed and a benson wire was placed.  The micropuncture sheath was exchanged for a 5 french sheath.  An omniflush catheter was advanced over the wire to the level of L-1.  An abdominal angiogram was obtained.  Next, using the omniflush catheter and a benson wire, the aortic bifurcation was crossed and the catheter was placed into theright external iliac artery and left runoff was obtained.  left runoff was performed via retrograde sheath injections.  Findings:   Aortogram:  No significant renal artery stenosis is identified.  The infrarenal abdominal aorta is calcified but patent without hemodynamically significant stenosis.  Bilateral iliac arteries are widely patent.  Right Lower Extremity:  Diffuse stenosis throughout the right common  femoral artery with approximately 80% stenosis at the origin of the profunda femoral artery.  The superficial femoral artery is occluded throughout its length.  There is reconstitution of an above-knee popliteal artery which is heavily diseased.  The below-knee popliteal artery is patent with minimal disease with single-vessel runoff via the anterior tibial artery.  Left Lower Extremity:  Left common femoral and profunda femoral artery are patent.  The superficial femoral artery is occluded in its midportion with reconstitution of the above-knee popliteal artery.  There is a stenosis within the above-knee popliteal artery approximately 50% secondary to calcification.  The below-knee popliteal artery is patent throughout it's course.  The dominant runoff vessel is the anterior tibial artery.  There is diffuse disease throughout the tibioperoneal trunk.  Posterior tibial artery is patent but diminutive.  The peroneal artery occludes.  Intervention:  None  Impression:  #1  occluded right superficial femoral artery with diffuse disease throughout the right common femoral artery and an 80% profundofemoral ostial stenosis.  #2  consideration for femoral endarterectomy, given that the patient has had his right saphenous vein harvested and he has claudication and would need a below-knee bypass  #3  occluded left superficial femoral artery with stenosis in the stool above-knee popliteal artery   #4  dominant runoff to the left foot is the anterior tibial artery.  A diseased tibioperoneal trunk and posterior tibial artery are patent at least down to the ankle.  #5  patient could potentially be a endovascular candidate for the left superficial femoral artery occlusion.   Theotis Burrow, M.D. [facility] Office:  [phone] Pager:  [phone]

## 2015-01-23 NOTE — H&P (View-Only) (Signed)
History of Present Illness  Ian Vivero. is a 81 y.o. (1928/12/26) male who presents with chief complaint: bilateral calf and thigh pain, right worse than left.  The patient's symptoms have progressed over the last 6 or 7 months. He was last seen in the office 02/20/14 where it was recommended that he initiate a walking program. This has not been successful and is able to ambulate less than 100 feet before experiencing bilateral calf and hip claudication. The patient was previously able to ambulate 200-300 feet before having claudication symptoms. He believes his hip pain is secondary to arthritis. He also complains of pain in his right leg and foot at night. The patient's treatment regimen currently included: maximal medical management with aspirin, plavix and a statin. He is not a smoker.   On ROS today: he complains of a tender sore on his right fifth toe that has been present for the past month. He says it is neither worsening or improving.  All other review of systems are negative  He reports feeling "great" from the "waist up" since his redo CABG in 2014 and remains active with yard work. He is frustrated that his leg pain is limiting him from pursuing his hobbies.     Physical Examination  Filed Vitals:   01/15/15 1407  BP: 144/70  Pulse: 54  Temp: 97.8 F (36.6 C)  TempSrc: Oral  Resp: 14  Height: 5\' 11"  (1.803 m)  Weight: 169 lb 6.4 oz (76.839 kg)  SpO2: 97%   Body mass index is 23.64 kg/(m^2).  General: A&O x 3, WDWN male in NAD  Pulmonary: Sym exp, good air movt, CTAB, no rales, rhonchi, & wheezing  Cardiac: RRR, Nl S1, S2, no Murmurs, rubs or gallops  Vascular: 2+ femoral pulses b/l, non palpable popliteal and pedal pulses b/l; right fifth toe with small superficial blister. No drainage, no cellulitis, no gangrenous changes.   Musculoskeletal: No muscle wasting or atrophy.   Neurologic: No focal deficits.   Non-Invasive Vascular Imaging, reviewed and  interpreted ABI (Date: 01/15/2015)  R: 0.2  L: 0.62  Medical Decision Making  Ian Vanroekel. is a 81 y.o. male who presents with:  Worsening bilateral leg intermittent claudication, right greater than left and right lower extremity rest pain. He does have a nonhealing superficial blister of his right 5th toe.   The patient will need an arteriogram to evaluate revascularization options.   The procedure, risks, and benefits were discussed at length with the patient and he is willing to proceed.   He will be scheduled for an arteriogram with bilateral runoff and possible intervention on 01/23/15 with Dr. Trula Slade.   He understands that if there are no percutaneous revascularization options, he may require bypass surgery. He has a history of bilateral saphenous vein harvest.   He is on maximal medical management with aspirin, plavix and statin. He is not a smoker.   He knows to contact the office sooner if he symptoms worsen before his procedure.    Ian Jock, PA-C [facility] Office: [phone] Pager: [phone]  01/15/2015, 2:47 PM   This patient was seen in conjunction with Dr. Trula Slade.   I agree with the above.  The patient has had progression of his claudication symptoms to where he can no longer tolerate his quality of life.  His right leg bothers him more so than the left.  ABIs today were performed which were 0.23 on the right and  0.62 on the left.  We discussed proceeding with angiography.  I'll plan on cannulating the left groin with plans for intervention on the right leg if possible.  This is been scheduled for Tuesday, September 27   Annamarie Major

## 2015-01-24 ENCOUNTER — Ambulatory Visit (INDEPENDENT_AMBULATORY_CARE_PROVIDER_SITE_OTHER): Payer: Commercial Managed Care - HMO | Admitting: Internal Medicine

## 2015-01-24 ENCOUNTER — Ambulatory Visit: Payer: Medicare HMO | Admitting: Internal Medicine

## 2015-01-24 ENCOUNTER — Telehealth: Payer: Self-pay

## 2015-01-24 ENCOUNTER — Encounter: Payer: Self-pay | Admitting: Internal Medicine

## 2015-01-24 VITALS — BP 122/44 | HR 52 | Ht 69.75 in | Wt 168.0 lb

## 2015-01-24 DIAGNOSIS — Z86010 Personal history of colonic polyps: Secondary | ICD-10-CM

## 2015-01-24 DIAGNOSIS — I25810 Atherosclerosis of coronary artery bypass graft(s) without angina pectoris: Secondary | ICD-10-CM | POA: Diagnosis not present

## 2015-01-24 DIAGNOSIS — Z7902 Long term (current) use of antithrombotics/antiplatelets: Secondary | ICD-10-CM | POA: Diagnosis not present

## 2015-01-24 DIAGNOSIS — R195 Other fecal abnormalities: Secondary | ICD-10-CM

## 2015-01-24 MED ORDER — NA SULFATE-K SULFATE-MG SULF 17.5-3.13-1.6 GM/177ML PO SOLN: 1.0000 | Freq: Once | ORAL | Status: DC

## 2015-01-24 NOTE — Telephone Encounter (Signed)
Faxed anti-coagulation letter to Dr. Wynonia Lawman

## 2015-01-24 NOTE — Progress Notes (Signed)
HISTORY OF PRESENT ILLNESS:  Ian Cummings. is a 81 y.o. male with multiple significant medical problems who is referred today by Dr. Brigitte Pulse regarding Hemoccult-positive stool. The patient was evaluated in the office 12/13/2013 regarding persistently Hemoccult-positive stool. 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 His hemoglobin last year was 12.9. Recent outside medical records and laboratories have been reviewed today. On 01/24/2014 the patient underwent colonoscopy and upper endoscopy off Plavix. Colonoscopy revealed 15 polyps measuring between 3 and 11 mm which were removed and found to be adenomatous. Follow-up in one year recommended. Upper endoscopy was normal. The patient was in the hospital emergency room 12/19/2014 for atypical chest pain. His cardiologist is Dr. Wynonia Lawman. Hemoglobin that time was 13.6. Review of records from Dr. Silvestre Mesi office shows Hemoccult-positive stool with unremarkable comprehensive metabolic panel. Patient's GI review of systems is negative. In particular, no melena or hematochezia. No abdominal pain. He has had no interval cardiac problems. No hospitalizations. He remains active and enjoys fishing.  REVIEW OF SYSTEMS:  All non-GI ROS negative except for anxiety, depression, urinary hesitancy, neuropathy, arthritis  Past Medical History  Diagnosis Date  . CAD (coronary artery disease)   . Hyperlipidemia   . Hypertension   . GERD (gastroesophageal reflux disease)   . MI (myocardial infarction) 4/14  . Gout   . BPH (benign prostatic hypertrophy)   . Carotid artery disease     Right carotid stent 2013  . TIA (transient ischemic attack)     Hx: of  . H/O hiatal hernia   . Neuropathy     Hx: of in B/L toes  . Stroke   . AAA (abdominal aortic aneurysm)   . Peripheral  vascular disease     with claudication  . Anxiety   . Depression   . Pre-diabetes   . Hypothyroidism   . Colon polyps     adenomatous    Past Surgical History  Procedure Laterality Date  . Coronary artery bypass graft  1995  . Foot surgery Left   . Coronary angioplasty with stent placement  2010  . Inguinal hernia repair Bilateral     X 2   . Carotid stent insertion Right 2013    right at [facility]  . Tonsillectomy    . Colonoscopy      Hx: of  . Retinal detachment surgery Left     Hx: of left eye  . Cataract extraction Bilateral     Hx: of both eyes  . Fracture surgery Left     Hx: of Left heel  . Coronary artery bypass graft N/A 10/18/2012    Procedure: REDO CORONARY ARTERY BYPASS GRAFTING (CABG);  Surgeon: Grace Isaac, MD;  Location: [place];  Service: Open Heart Surgery;  Laterality: N/A;  off pump times two using endoscopically harvested left saphenous vein  . Intraoperative transesophageal echocardiogram N/A 10/18/2012    Procedure: INTRAOPERATIVE TRANSESOPHAGEAL ECHOCARDIOGRAM;  Surgeon: Grace Isaac, MD;  Location: [place];  Service: Open Heart Surgery;  Laterality: N/A;  . Mastoidectomy  1933  . Left heart catheterization with coronary/graft angiogram N/A 02/02/2013    Procedure: LEFT HEART CATHETERIZATION WITH Beatrix Fetters;  Surgeon: Jacolyn Reedy, MD;  Location: [facility] CATH LAB;  Service: Cardiovascular;  Laterality: N/A;  . Peripheral vascular catheterization N/A 01/23/2015    Procedure: Abdominal Aortogram;  Surgeon: Serafina Mitchell, MD;  Location: [place] CV LAB;  Service: Cardiovascular;  Laterality: N/A;    Social History [name].  reports that he quit smoking about 39 years ago. His smoking use included Cigarettes. He has never used smokeless tobacco. He reports that he drinks about 10.8 - 12.0 oz of alcohol per week. He reports that he does not use illicit drugs.  family history includes Diabetes in his father; Heart attack  in his mother; Heart disease in his father and mother; Hyperlipidemia in his father; Hypertension in his father; Stroke in his sister. There is no history of Colon cancer, Stomach cancer, Rectal cancer, Esophageal cancer, Liver disease, or Kidney disease.  Allergies  Allergen Reactions  . Metoprolol Itching       PHYSICAL EXAMINATION: Vital signs: BP 122/44 mmHg  Pulse 52  Ht 5' 9.75" (1.772 m)  Wt 168 lb (76.204 kg)  BMI 24.27 kg/m2  Constitutional: generally well-appearing, no acute distress Psychiatric: alert and oriented x3, cooperative Eyes: extraocular movements intact, anicteric, conjunctiva pink Mouth: oral pharynx moist, no lesions Neck: supple without thyromegaly Lymph: no lymphadenopathy Cardiovascular: heart regular rate and rhythm, no murmur Lungs: clear to auscultation bilaterally Abdomen: soft, nontender, nondistended, no obvious ascites, no peritoneal signs, normal bowel sounds, no organomegaly Rectal: Deferred until colonoscopy Extremities: no clubbing cyanosis or lower extremity edema bilaterally Skin: no lesions on visible extremities Neuro: No focal deficits. No asterixis.    ASSESSMENT:  #1. Hemoccult-positive stool. Normal hemoglobin. Asymptomatic. On chronic Plavix and aspirin. #2. History of multiple adenomatous colon polyps September 2015. Due for follow-up. Appropriate candidate without contraindication #3. Multiple significant medical problems including multiple vascular issues as described. #4. Normal EGD September 2015. On prophylactic lansoprazole   PLAN:  #1. Colonoscopy. This to evaluate Hemoccult-positive stool and provide neoplasia surveillance. The patient is high-risk given his age and comorbidities. As well as need to address antiplatelets therapy.The nature of the procedure, as well as the risks, benefits, and alternatives were carefully and thoroughly reviewed with the patient. Ample time for discussion and questions allowed. The patient  understood, was satisfied, and agreed to proceed. #2. Hold Plavix 5-7 days prior to procedure as was done last year. Continue aspirin. Confirm with cardiologist Dr. Wynonia Lawman as previous. #3. Continue lansoprazole for upper GI mucosal protection #4. Would not check Hemoccults studies routinely in this patient given his age, comorbidities, and enrollment in surveillance colonoscopy program

## 2015-01-24 NOTE — Patient Instructions (Signed)

## 2015-01-26 ENCOUNTER — Telehealth: Payer: Self-pay | Admitting: Surgery

## 2015-01-26 NOTE — Telephone Encounter (Signed)
-----   Message from Mena Goes, RN sent at 01/23/2015 10:33 AM EDT ----- Regarding: Schedule   ----- Message -----    From: Serafina Mitchell, MD    Sent: 01/23/2015  10:27 AM      To: [facility] Charge Pool  01/23/2015:  Surgeon:  Annamarie Major Procedure Performed:  1.  Ultrasound-guided access, left femoral artery  2.  Abdominal aortogram  3.  Bilateral lower extremity runoff  4.  Second order catheterization  Schedule follow-up in the office in one dose 2 weeks with lower extremity vein mapping.  The patient also need carotid Doppler studies for preoperative evaluation

## 2015-01-26 NOTE — Telephone Encounter (Signed)
I have a question into the Vascular Lab for 10/10 appts- waiting for the "OK" to schedule, dpm

## 2015-02-01 DIAGNOSIS — L570 Actinic keratosis: Secondary | ICD-10-CM | POA: Diagnosis not present

## 2015-02-01 DIAGNOSIS — L821 Other seborrheic keratosis: Secondary | ICD-10-CM | POA: Diagnosis not present

## 2015-02-01 DIAGNOSIS — H61001 Unspecified perichondritis of right external ear: Secondary | ICD-10-CM | POA: Diagnosis not present

## 2015-02-02 ENCOUNTER — Encounter: Payer: Self-pay | Admitting: Surgery

## 2015-02-05 ENCOUNTER — Ambulatory Visit (INDEPENDENT_AMBULATORY_CARE_PROVIDER_SITE_OTHER): Payer: Commercial Managed Care - HMO | Admitting: Surgery

## 2015-02-05 ENCOUNTER — Encounter: Payer: Self-pay | Admitting: Surgery

## 2015-02-05 ENCOUNTER — Ambulatory Visit (HOSPITAL_COMMUNITY)
Admission: RE | Admit: 2015-02-05 | Discharge: 2015-02-05 | Disposition: A | Discharge status: Home / Self Care | Payer: Commercial Managed Care - HMO | Source: Ambulatory Visit | Attending: Surgery | Admitting: Surgery

## 2015-02-05 ENCOUNTER — Ambulatory Visit (INDEPENDENT_AMBULATORY_CARE_PROVIDER_SITE_OTHER)
Admission: RE | Admit: 2015-02-05 | Discharge: 2015-02-05 | Disposition: A | Discharge status: Home / Self Care | Payer: Commercial Managed Care - HMO | Source: Ambulatory Visit | Attending: Surgery | Admitting: Surgery

## 2015-02-05 VITALS — BP 134/55 | HR 50 | Temp 97.7°F | Resp 16 | Ht 70.0 in | Wt 170.0 lb

## 2015-02-05 DIAGNOSIS — I739 Peripheral vascular disease, unspecified: Secondary | ICD-10-CM

## 2015-02-05 DIAGNOSIS — Z01810 Encounter for preprocedural cardiovascular examination: Secondary | ICD-10-CM

## 2015-02-05 DIAGNOSIS — I6523 Occlusion and stenosis of bilateral carotid arteries: Secondary | ICD-10-CM | POA: Diagnosis not present

## 2015-02-05 DIAGNOSIS — I70209 Unspecified atherosclerosis of native arteries of extremities, unspecified extremity: Secondary | ICD-10-CM | POA: Insufficient documentation

## 2015-02-05 DIAGNOSIS — I70219 Atherosclerosis of native arteries of extremities with intermittent claudication, unspecified extremity: Secondary | ICD-10-CM

## 2015-02-05 NOTE — Progress Notes (Signed)
Patient name: Ian Cummings. MRN: [id number] DOB: 1928/05/01 Sex: male     Chief Complaint  Patient presents with  . PVD    Discuss aortogram and vein mapping     HISTORY OF PRESENT ILLNESS: The patient is back for further discussions regarding his claudication.  He underwent angiography on 01/23/2015.  This revealed bilateral superficial femoral artery occlusion and right common femoral stenosis and proximal profunda femoral stenosis.  He's back today to discuss surgical options.  He has had no significant symptomatic changes  Past Medical History  Diagnosis Date  . CAD (coronary artery disease)   . Hyperlipidemia   . Hypertension   . GERD (gastroesophageal reflux disease)   . MI (myocardial infarction) ([place]) 4/14  . Gout   . BPH (benign prostatic hypertrophy)   . Carotid artery disease ([place])     Right carotid stent 2013  . TIA (transient ischemic attack)     Hx: of  . H/O hiatal hernia   . Neuropathy ([facility])     Hx: of in B/L toes  . Stroke ([place])   . AAA (abdominal aortic aneurysm) ([place])   . Peripheral vascular disease ([place])     with claudication  . Anxiety   . Depression   . Pre-diabetes   . Hypothyroidism   . Colon polyps     adenomatous    Past Surgical History  Procedure Laterality Date  . Coronary artery bypass graft  1995  . Foot surgery Left   . Coronary angioplasty with stent placement  2010  . Inguinal hernia repair Bilateral     X 2   . Carotid stent insertion Right 2013    right at [facility]  . Tonsillectomy    . Colonoscopy      Hx: of  . Retinal detachment surgery Left     Hx: of left eye  . Cataract extraction Bilateral     Hx: of both eyes  . Fracture surgery Left     Hx: of Left heel  . Coronary artery bypass graft N/A 10/18/2012    Procedure: REDO CORONARY ARTERY BYPASS GRAFTING (CABG);  Surgeon: Grace Isaac, MD;  Location: [place];  Service: Open Heart Surgery;  Laterality: N/A;  off pump times two using endoscopically  harvested left saphenous vein  . Intraoperative transesophageal echocardiogram N/A 10/18/2012    Procedure: INTRAOPERATIVE TRANSESOPHAGEAL ECHOCARDIOGRAM;  Surgeon: Grace Isaac, MD;  Location: [place];  Service: Open Heart Surgery;  Laterality: N/A;  . Mastoidectomy  1933  . Left heart catheterization with coronary/graft angiogram N/A 02/02/2013    Procedure: LEFT HEART CATHETERIZATION WITH Beatrix Fetters;  Surgeon: Jacolyn Reedy, MD;  Location: [facility] CATH LAB;  Service: Cardiovascular;  Laterality: N/A;  . Peripheral vascular catheterization N/A 01/23/2015    Procedure: Abdominal Aortogram;  Surgeon: Serafina Mitchell, MD;  Location: [place] CV LAB;  Service: Cardiovascular;  Laterality: N/A;    Social History   Social History  . Marital Status: Married    Spouse Name: N/A  . Number of Children: 3  . Years of Education: N/A   Occupational History  . retired    Social History Main Topics  . Smoking status: Former Smoker    Types: Cigarettes    Quit date: 04/29/1975  . Smokeless tobacco: Never Used  . Alcohol Use: 10.8 - 12.0 oz/week    12 Glasses of wine, 6-8 Shots of liquor per week     Comment: Occasional  drink  . Drug Use: No  . Sexual Activity: Not Currently   Other Topics Concern  . 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 Heart disease Mother     After 17 yrs of age  . Heart attack Mother   . Hyperlipidemia Father   . Hypertension Father   . Heart disease Father     After 93 yrs of age  . Diabetes Father   . Stroke Sister   . Colon cancer Neg Hx   . Stomach cancer Neg Hx   . Rectal cancer Neg Hx   . Esophageal cancer Neg Hx   . Liver disease Neg Hx   . Kidney disease Neg Hx     Allergies as of 02/05/2015 - Review Complete 02/05/2015  Allergen Reaction Noted  . Metoprolol Itching 12/20/2012    Current Outpatient Prescriptions on File Prior to Visit    Medication Sig Dispense Refill  . allopurinol (ZYLOPRIM) 300 MG tablet Take 300 mg by mouth daily.    Marland Kitchen amLODipine (NORVASC) 10 MG tablet Take 10 mg by mouth daily.    Marland Kitchen aspirin EC 81 MG tablet Take 81 mg by mouth daily.    Marland Kitchen atenolol (TENORMIN) 50 MG tablet Take 50 mg by mouth daily.    . cholecalciferol (VITAMIN D) 1000 UNITS tablet Take 2,000 Units by mouth daily.    . clopidogrel (PLAVIX) 75 MG tablet Take 75 mg by mouth daily.    . fish oil-omega-3 fatty acids 1000 MG capsule Take 1 g by mouth daily.     . hydrochlorothiazide (HYDRODIURIL) 12.5 MG tablet Take 25 mg by mouth daily.     . lansoprazole (PREVACID) 15 MG capsule Take 15 mg by mouth daily.    Marland Kitchen levothyroxine (SYNTHROID, LEVOTHROID) 88 MCG tablet Take 88 mcg by mouth daily before breakfast.    . lisinopril (PRINIVIL,ZESTRIL) 20 MG tablet Take 20 mg by mouth daily.    . multivitamin (RENA-VIT) TABS tablet Take 1 tablet by mouth daily.    . Na Sulfate-K Sulfate-Mg Sulf SOLN Take 1 kit by mouth once. 354 mL 0  . PARoxetine (PAXIL) 20 MG tablet Take 20 mg by mouth every morning.    . ramipril (ALTACE) 2.5 MG tablet Take 2.5 mg by mouth daily.     . rosuvastatin (CRESTOR) 40 MG tablet Take 40 mg by mouth daily.    . vitamin B-12 (CYANOCOBALAMIN) 1000 MCG tablet Take 1,000 mcg by mouth daily.     No current facility-administered medications on file prior to visit.     REVIEW OF SYSTEMS: See history of present illness, otherwise negative  PHYSICAL EXAMINATION:   Vital signs are  Filed Vitals:   02/05/15 1400  BP: 134/55  Pulse: 50  Temp: 97.7 F (36.5 C)  TempSrc: Oral  Resp: 16  Height: $Remove'5\' 10"'iFdxcCn$  (1.778 m)  Weight: 170 lb (77.111 kg)  SpO2: 97%   Body mass index is 24.39 kg/(m^2). General: The patient appears their stated age. HEENT:  No gross abnormalities Pulmonary:  Non labored breathing Abdomen: Soft and non-tender Musculoskeletal: There are no major deformities. Neurologic: No focal weakness or paresthesias  are detected, Skin: There are no ulcer or rashes noted. Psychiatric: The patient has normal affect. Cardiovascular: There is a regular rate and rhythm without significant murmur appreciated.  I have ordered and reviewed his carotid Doppler study which shows 40-59%  bilateral carotid stenosis  Assessment: Atherosclerosis with claudication right greater than left Plan: I discussed her treatment options with the patient.  His vein mapping shows that there is not adequate saphenous vein bilaterally therefore he would need a prosthetic bypass graft.  On the right is with require below-knee bypass.  I therefore proposed proceeding with right femoral endarterectomy to help alleviate or improve some of his symptoms.  He potentially could be a candidate for percutaneous treatment of his left superficial femoral artery occlusion.  He does have a history of early thrombosis of coronary stents.  After lengthy discussion, the patient would like to think about this and discuss it with his wife.  He will contact me if he decides to proceed with right common femoral endarterectomy.  Otherwise he will follow up in one year with repeat lower extremity vascular studies and a carotid duplex  V. Leia Alf, M.D. [facility] Office: [phone] Pager:  [phone]

## 2015-02-05 NOTE — Addendum Note (Signed)
Addended by: Dorthula Rue L on: 02/05/2015 05:06 PM   Modules accepted: Orders

## 2015-02-07 ENCOUNTER — Telehealth: Payer: Self-pay

## 2015-02-07 NOTE — Telephone Encounter (Signed)
Lm with Juliann Pulse at Dr. Thurman Coyer office to return my call regarding anti-coagulation letter faxed last week.

## 2015-02-13 DIAGNOSIS — H40013 Open angle with borderline findings, low risk, bilateral: Secondary | ICD-10-CM | POA: Diagnosis not present

## 2015-02-13 DIAGNOSIS — H21232 Degeneration of iris (pigmentary), left eye: Secondary | ICD-10-CM | POA: Diagnosis not present

## 2015-02-26 ENCOUNTER — Encounter (HOSPITAL_COMMUNITY): Payer: Commercial Managed Care - HMO

## 2015-03-05 ENCOUNTER — Ambulatory Visit: Payer: Commercial Managed Care - HMO | Admitting: Surgery

## 2015-03-08 NOTE — Telephone Encounter (Signed)
Lm for patient to call back so I could give him instructions about holding his Plavix before his procedure

## 2015-03-08 NOTE — Telephone Encounter (Signed)
Spoke with patient and told him that, per Dr. Wynonia Lawman, he was to hold his Plavix for 7 days prior to his procedure but to continue his aspirin.  Patient acknowledged and understood

## 2015-03-27 ENCOUNTER — Ambulatory Visit (AMBULATORY_SURGERY_CENTER): Payer: Commercial Managed Care - HMO | Admitting: Internal Medicine

## 2015-03-27 ENCOUNTER — Encounter: Payer: Self-pay | Admitting: Internal Medicine

## 2015-03-27 VITALS — BP 127/66 | HR 55 | Temp 96.9°F | Resp 22 | Ht 69.75 in | Wt 168.0 lb

## 2015-03-27 DIAGNOSIS — K219 Gastro-esophageal reflux disease without esophagitis: Secondary | ICD-10-CM | POA: Diagnosis not present

## 2015-03-27 DIAGNOSIS — D123 Benign neoplasm of transverse colon: Secondary | ICD-10-CM | POA: Diagnosis not present

## 2015-03-27 DIAGNOSIS — Z951 Presence of aortocoronary bypass graft: Secondary | ICD-10-CM | POA: Diagnosis not present

## 2015-03-27 DIAGNOSIS — D124 Benign neoplasm of descending colon: Secondary | ICD-10-CM | POA: Diagnosis not present

## 2015-03-27 DIAGNOSIS — I10 Essential (primary) hypertension: Secondary | ICD-10-CM | POA: Diagnosis not present

## 2015-03-27 DIAGNOSIS — Z8673 Personal history of transient ischemic attack (TIA), and cerebral infarction without residual deficits: Secondary | ICD-10-CM | POA: Diagnosis not present

## 2015-03-27 DIAGNOSIS — Z86010 Personal history of colonic polyps: Secondary | ICD-10-CM

## 2015-03-27 DIAGNOSIS — R195 Other fecal abnormalities: Secondary | ICD-10-CM | POA: Diagnosis present

## 2015-03-27 DIAGNOSIS — E039 Hypothyroidism, unspecified: Secondary | ICD-10-CM | POA: Diagnosis not present

## 2015-03-27 DIAGNOSIS — K635 Polyp of colon: Secondary | ICD-10-CM | POA: Diagnosis not present

## 2015-03-27 DIAGNOSIS — I2510 Atherosclerotic heart disease of native coronary artery without angina pectoris: Secondary | ICD-10-CM | POA: Diagnosis not present

## 2015-03-27 MED ORDER — SODIUM CHLORIDE 0.9 % IV SOLN: 500.0000 mL | INTRAVENOUS | Status: DC

## 2015-03-27 NOTE — Progress Notes (Signed)
Called to room to assist during endoscopic procedure.  Patient ID and intended procedure confirmed with present staff. Received instructions for my participation in the procedure from the performing physician.  

## 2015-03-27 NOTE — Patient Instructions (Signed)
YOU HAD AN ENDOSCOPIC PROCEDURE TODAY AT [facility]:   Refer to the procedure report that was given to you for any specific questions about what was found during the examination.  If the procedure report does not answer your questions, please call your gastroenterologist to clarify.  If you requested that your care partner not be given the details of your procedure findings, then the procedure report has been included in a sealed envelope for you to review at your convenience later.  YOU SHOULD EXPECT: Some feelings of bloating in the abdomen. Passage of more gas than usual.  Walking can help get rid of the air that was put into your GI tract during the procedure and reduce the bloating. If you had a lower endoscopy (such as a colonoscopy or flexible sigmoidoscopy) you may notice spotting of blood in your stool or on the toilet paper. If you underwent a bowel prep for your procedure, you may not have a normal bowel movement for a few days.  Please Note:  You might notice some irritation and congestion in your nose or some drainage.  This is from the oxygen used during your procedure.  There is no need for concern and it should clear up in a day or so.  SYMPTOMS TO REPORT IMMEDIATELY:   Following lower endoscopy (colonoscopy or flexible sigmoidoscopy):  Excessive amounts of blood in the stool  Significant tenderness or worsening of abdominal pains  Swelling of the abdomen that is new, acute  Fever of 100F or higher  For urgent or emergent issues, a gastroenterologist can be reached at any hour by calling [phone].   DIET: Your first meal following the procedure should be a small meal and then it is ok to progress to your normal diet. Heavy or fried foods are harder to digest and may make you feel nauseous or bloated.  Likewise, meals heavy in dairy and vegetables can increase bloating.  Drink plenty of fluids but you should avoid alcoholic beverages for 24  hours.  ACTIVITY:  You should plan to take it easy for the rest of today and you should NOT DRIVE or use heavy machinery until tomorrow (because of the sedation medicines used during the test).    FOLLOW UP: Our staff will call the number listed on your records the next business day following your procedure to check on you and address any questions or concerns that you may have regarding the information given to you following your procedure. If we do not reach you, we will leave a message.  However, if you are feeling well and you are not experiencing any problems, there is no need to return our call.  We will assume that you have returned to your regular daily activities without incident.  If any biopsies were taken you will be contacted by phone or by letter within the next 1-3 weeks.  Please call us at [phone] if you have not heard about the biopsies in 3 weeks.   SIGNATURES/CONFIDENTIALITY: You and/or your care partner have signed paperwork which will be entered into your electronic medical record.  These signatures attest to the fact that that the information above on your After Visit Summary has been reviewed and is understood.  Full responsibility of the confidentiality of this discharge information lies with you and/or your care-partner.  Continue your normal medications- including your Plavix today  Please read over handouts about polyps, diverticulosis and high fiber diets  Follow up with Dr. Henrene Pastor  as needed, return to care of your primary doctor

## 2015-03-27 NOTE — Op Note (Signed)
[facility]  [address] [place], [id number]   COLONOSCOPY PROCEDURE REPORT  PATIENT: Ian Cummings, Ian Cummings  MR#: [id number] BIRTHDATE: Jul 11, 1928 , 20  yrs. old GENDER: male ENDOSCOPIST: Eustace Quail, MD REFERRED [id number] Program Recall PROCEDURE DATE:  03/27/2015 PROCEDURE:   Colonoscopy, surveillance and Colonoscopy with snare polypectomy x 3 First Screening Colonoscopy - Avg.  risk and is 50 yrs.  old or older - No.  Prior Negative Screening - Now for repeat screening. N/A  History of Adenoma - Now for follow-up colonoscopy & has been > or = to 3 yrs.  No.  It has been less than 3 yrs since last colonoscopy.  Medical reason.  Polyps removed today? Yes ASA CLASS:   Class III INDICATIONS:Surveillance due to prior colonic neoplasia and PH Colon Adenoma. Heme positive stool. Index exam August 2015 with 15 tubular adenomas. MEDICATIONS: Monitored anesthesia care and Propofol 200 mg IV DESCRIPTION OF PROCEDURE:   After the risks benefits and alternatives of the procedure were thoroughly explained, informed consent was obtained.  The digital rectal exam revealed no abnormalities of the rectum.   The [id number]  endoscope was introduced through the anus and advanced to the cecum, which was identified by both the appendix and ileocecal valve. No adverse events experienced.   The quality of the prep was excellent. (Suprep was used)  The instrument was then slowly withdrawn as the colon was fully examined. Estimated blood loss is zero unless otherwise noted in this procedure report.  COLON FINDINGS: Three polyps ranging between 3-59mm in size were found in the descending colon and transverse colon.  A polypectomy was performed with a cold snare.  The resection was complete, the polyp tissue was completely retrieved and sent to histology. There was severe diverticulosis noted in the sigmoid colon.   A tiny angiodysplastic lesion was found at the cecum.    The examination was otherwise normal.  Retroflexed views revealed no abnormalities. The time to cecum = 3.6 Withdrawal time = 15.5   The scope was withdrawn and the procedure completed. COMPLICATIONS: There were no immediate complications.  ENDOSCOPIC IMPRESSION: 1.   Three polyps were found in the descending colon and transverse colon; polypectomy was performed with a cold snare 2.   Severe diverticulosis was noted in the sigmoid colon 3.   Angiodysplastic lesion at the cecum . Can cause heme positive stool 4.   The examination was otherwise normal  RECOMMENDATIONS: 1.  Resume Plavix today 2.  Return to the care of your primary provider.  GI follow up as needed  eSigned:  Eustace Quail, MD 03/27/2015 2:55 PM   cc: Janalyn Rouse, MD and The Patient

## 2015-03-27 NOTE — Progress Notes (Signed)
Report to PACU, RN, vss, BBS= Clear.  

## 2015-03-28 ENCOUNTER — Telehealth: Payer: Self-pay | Admitting: *Deleted

## 2015-03-28 NOTE — Telephone Encounter (Signed)
  Follow up Call-  Call back number 03/27/2015 01/24/2014  Post procedure Call Back phone  # [phone]  Permission to leave phone message Yes Yes     Patient questions:  Do you have a fever, pain , or abdominal swelling? No. Pain Score  0 *  Have you tolerated food without any problems? Yes.    Have you been able to return to your normal activities? No.  Do you have any questions about your discharge instructions: Diet   No. Medications  No. Follow up visit  No.  Do you have questions or concerns about your Care? No.  Actions: * If pain score is 4 or above: No action needed, pain <4.

## 2015-04-03 ENCOUNTER — Encounter: Payer: Self-pay | Admitting: Internal Medicine

## 2015-04-03 DIAGNOSIS — Z951 Presence of aortocoronary bypass graft: Secondary | ICD-10-CM | POA: Diagnosis not present

## 2015-04-03 DIAGNOSIS — I70219 Atherosclerosis of native arteries of extremities with intermittent claudication, unspecified extremity: Secondary | ICD-10-CM | POA: Diagnosis not present

## 2015-04-03 DIAGNOSIS — E785 Hyperlipidemia, unspecified: Secondary | ICD-10-CM | POA: Diagnosis not present

## 2015-04-03 DIAGNOSIS — I6523 Occlusion and stenosis of bilateral carotid arteries: Secondary | ICD-10-CM | POA: Diagnosis not present

## 2015-04-03 DIAGNOSIS — I2510 Atherosclerotic heart disease of native coronary artery without angina pectoris: Secondary | ICD-10-CM | POA: Diagnosis not present

## 2015-04-03 DIAGNOSIS — I25719 Atherosclerosis of autologous vein coronary artery bypass graft(s) with unspecified angina pectoris: Secondary | ICD-10-CM | POA: Diagnosis not present

## 2015-04-06 DIAGNOSIS — Z961 Presence of intraocular lens: Secondary | ICD-10-CM | POA: Diagnosis not present

## 2015-04-06 DIAGNOSIS — H21232 Degeneration of iris (pigmentary), left eye: Secondary | ICD-10-CM | POA: Diagnosis not present

## 2015-04-06 DIAGNOSIS — E119 Type 2 diabetes mellitus without complications: Secondary | ICD-10-CM | POA: Diagnosis not present

## 2015-04-06 DIAGNOSIS — H40013 Open angle with borderline findings, low risk, bilateral: Secondary | ICD-10-CM | POA: Diagnosis not present

## 2015-06-08 DIAGNOSIS — I6529 Occlusion and stenosis of unspecified carotid artery: Secondary | ICD-10-CM | POA: Diagnosis not present

## 2015-06-08 DIAGNOSIS — M109 Gout, unspecified: Secondary | ICD-10-CM | POA: Diagnosis not present

## 2015-06-08 DIAGNOSIS — R7301 Impaired fasting glucose: Secondary | ICD-10-CM | POA: Diagnosis not present

## 2015-06-08 DIAGNOSIS — I10 Essential (primary) hypertension: Secondary | ICD-10-CM | POA: Diagnosis not present

## 2015-06-08 DIAGNOSIS — M25551 Pain in right hip: Secondary | ICD-10-CM | POA: Diagnosis not present

## 2015-06-08 DIAGNOSIS — Z955 Presence of coronary angioplasty implant and graft: Secondary | ICD-10-CM | POA: Diagnosis not present

## 2015-06-08 DIAGNOSIS — I739 Peripheral vascular disease, unspecified: Secondary | ICD-10-CM | POA: Diagnosis not present

## 2015-06-08 DIAGNOSIS — E784 Other hyperlipidemia: Secondary | ICD-10-CM | POA: Diagnosis not present

## 2015-06-08 DIAGNOSIS — Z9861 Coronary angioplasty status: Secondary | ICD-10-CM | POA: Diagnosis not present

## 2015-06-25 ENCOUNTER — Other Ambulatory Visit: Payer: Self-pay

## 2015-06-25 ENCOUNTER — Encounter: Payer: Self-pay | Admitting: Surgery

## 2015-06-25 ENCOUNTER — Ambulatory Visit (INDEPENDENT_AMBULATORY_CARE_PROVIDER_SITE_OTHER): Payer: Commercial Managed Care - HMO | Admitting: Surgery

## 2015-06-25 VITALS — BP 110/50 | HR 50 | Temp 98.2°F | Resp 14 | Ht 70.0 in | Wt 173.0 lb

## 2015-06-25 DIAGNOSIS — I739 Peripheral vascular disease, unspecified: Secondary | ICD-10-CM | POA: Diagnosis not present

## 2015-06-25 DIAGNOSIS — I70219 Atherosclerosis of native arteries of extremities with intermittent claudication, unspecified extremity: Secondary | ICD-10-CM

## 2015-06-25 NOTE — Progress Notes (Signed)
[facility] [place]  Patient name: Ian Cummings. MRN: [id number] DOB: 13-Nov-1928 Sex: male  REASON FOR VISIT: right eg pain  HPI: Ian Cummings. is a 82 y.o. male who presents with worsening right leg pain over the past several months. He was last seen in the office on 02/05/2015. He underwent angiography on 01/23/2015 that revealed bilateral superficial femoral femoral artery occlusion and right common femoral stenosis and proximal profunda femoral stenosis. He was offered right femoral endarterectomy, but decided to not pursue surgery. He now complains of right rest pain.  His right hip has also been bothering him. He is able to ambulate only 50 yards before having to stop secondary to pain in his right thigh and calf. He does not have any nonhealing wounds.He states that his right leg pain is significantly interfering with his daily activities. He does note issues with left leg claudication but this is tolerable.  He has a past medical history of CAD status post stenting with early stent thrombosis. He has hypertension managed on multiple antihypertensives. His hypercholesterolemia is managed with a statin. He is on Plavix. He is a former smoker.  Past Medical History  Diagnosis Date  . CAD (coronary artery disease)   . Hyperlipidemia   . Hypertension   . GERD (gastroesophageal reflux disease)   . MI (myocardial infarction) ([place]) 4/14  . Gout   . BPH (benign prostatic hypertrophy)   . Carotid artery disease ([place])     Right carotid stent 2013  . TIA (transient ischemic attack)     Hx: of  . H/O hiatal hernia   . Neuropathy (HCC)     Hx: of in B/L toes  . Stroke ([place])   . AAA (abdominal aortic aneurysm) ([place])     2004, Pt stated not there now  . Peripheral vascular disease ([place])     with claudication  . Anxiety   . Depression   . Pre-diabetes   . Hypothyroidism   . Colon polyps     adenomatous  . Arthritis     Family History  Problem  Relation Age of Onset  . Heart disease Mother     After 20 yrs of age  . Heart attack Mother   . Hyperlipidemia Father   . Hypertension Father   . Heart disease Father     After 87 yrs of age  . Diabetes Father   . Stroke Sister   . Colon cancer Neg Hx   . Stomach cancer Neg Hx   . Rectal cancer Neg Hx   . Esophageal cancer Neg Hx   . Liver disease Neg Hx   . Kidney disease Neg Hx     SOCIAL HISTORY: Social History  Substance Use Topics  . Smoking status: Former Smoker    Types: Cigarettes    Quit date: 04/29/1975  . Smokeless tobacco: Never Used  . Alcohol Use: 10.8 - 12.0 oz/week    12 Glasses of wine, 6-8 Shots of liquor per week     Comment: Occasional drink    Allergies  Allergen Reactions  . Metoprolol Itching    Current Outpatient Prescriptions  Medication Sig Dispense Refill  . allopurinol (ZYLOPRIM) 300 MG tablet Take 300 mg by mouth 2 (two) times daily.     Marland Kitchen amLODipine (NORVASC) 10 MG tablet Take 10 mg by mouth daily.    Marland Kitchen aspirin EC 81 MG tablet Take 81 mg by mouth daily.    Marland Kitchen  atenolol (TENORMIN) 50 MG tablet Take 50 mg by mouth daily.    . cholecalciferol (VITAMIN D) 1000 UNITS tablet Take 2,000 Units by mouth daily.    . clopidogrel (PLAVIX) 75 MG tablet Take 75 mg by mouth daily.    . fish oil-omega-3 fatty acids 1000 MG capsule Take 1 g by mouth daily.     . hydrochlorothiazide (HYDRODIURIL) 12.5 MG tablet Take 25 mg by mouth daily.     . lansoprazole (PREVACID) 15 MG capsule Take 15 mg by mouth daily.    Marland Kitchen levothyroxine (SYNTHROID, LEVOTHROID) 88 MCG tablet Take 88 mcg by mouth daily before breakfast.    . lisinopril (PRINIVIL,ZESTRIL) 20 MG tablet Take 20 mg by mouth daily.    . multivitamin (RENA-VIT) TABS tablet Take 1 tablet by mouth daily.    Marland Kitchen PARoxetine (PAXIL) 20 MG tablet Take 20 mg by mouth every morning.    . ramipril (ALTACE) 2.5 MG tablet Take 2.5 mg by mouth daily.     . rosuvastatin (CRESTOR) 40 MG tablet Take 40 mg by mouth daily.      . vitamin B-12 (CYANOCOBALAMIN) 1000 MCG tablet Take 1,000 mcg by mouth daily.     No current facility-administered medications for this visit.    REVIEW OF SYSTEMS:  [X]  denotes positive finding, [ ]  denotes negative finding Cardiac  Comments:  Chest pain or chest pressure:    Shortness of breath upon exertion:    Short of breath when lying flat:    Irregular heart rhythm:        Vascular    Pain in calf, thigh, or hip brought on by ambulation: x Right thigh and foot.   Pain in feet at night that wakes you up from your sleep:  x   Blood clot in your veins:    Leg swelling:         Pulmonary    Oxygen at home:    Productive cough:     Wheezing:         Neurologic    Sudden weakness in arms or legs:     Sudden numbness in arms or legs:     Sudden onset of difficulty speaking or slurred speech:    Temporary loss of vision in one eye:     Problems with dizziness:         Gastrointestinal    Blood in stool:     Vomited blood:         Genitourinary    Burning when urinating:     Blood in urine:        Psychiatric    Major depression:         Hematologic    Bleeding problems:    Problems with blood clotting too easily:        Skin    Rashes or ulcers:        Constitutional    Fever or chills:      PHYSICAL EXAM: Filed Vitals:   06/25/15 1509  BP: 110/50  Pulse: 50  Temp: 98.2 F (36.8 C)  TempSrc: Oral  Resp: 14  Height: 5\' 10"  (1.778 m)  Weight: 173 lb (78.472 kg)  SpO2: 98%    GENERAL: The patient is a well-nourished male, in no acute distress. The vital signs are documented above. CARDIAC: There is a regular rate and rhythm. Right carotid bruit. VASCULAR: 2+ femoral pulses bilaterally. Nonpalpable popliteal and pedal pulses bilaterally. Right foot with red/purple discoloration at toes.  No wounds seen. PULMONARY: There is good air exchange bilaterally without wheezing or rales. MUSCULOSKELETAL: There are no major deformities or  cyanosis. NEUROLOGIC: No focal weakness or paresthesias are detected. SKIN: There are no ulcers or rashes noted. PSYCHIATRIC: The patient has a normal affect.   MEDICAL ISSUES: Atherosclerosis of the native arteries with rest pain (right)  The patient has had significant worsening of right leg pain in the last several months. He now has rest pain on the right. The patient last had an arteriogram in September 2016. This revealed bilateral superficial femoral artery occlusion and right common femoral stenosis and proximal profunda femoral stenosis. The patient would like to proceed with surgery given severity of his symptoms. We will need to repeat angiography before proceeding with surgery. He may require a bypass. He does not have adequate saphenous vein bilaterally and will need a prosthetic bypass graft. His arteriogram will be scheduled for 06/27/2015 with Dr. Trula Slade. He will continue his Plavix.  Virgina Jock, PA-C [facility] [place]    I agree with the above.  I have seen and evaluated the patient.  Briefly, I evaluated him for claudication 6 months ago.  He had angiogram that showed a right common femoral stenosis and bilateral superficial femoral artery occlusion.  In order to revascularize his right leg he would have needed a right femoral to below-knee popliteal bypass graft with right femoral endarterectomy.  He did not have an adequate vein and therefore I contemplated just doing a right femoral endarterectomy.  The patient did not feel his symptoms were severe enough and wanted to try to manage this without surgery.  Over the past several weeks the pain has gotten worse to the point that he is having rest pain.  I think that he needs to undergo angiography to see what has changed.  He still has palpable femoral pulses bilaterally.  Angiography has been scheduled for Wednesday, March 1.  I will make a surgical plan based on these images. He is on Plavix and this  will need to be discontinued prior to surgery  Annamarie Major

## 2015-06-26 ENCOUNTER — Other Ambulatory Visit (HOSPITAL_COMMUNITY): Payer: Self-pay | Admitting: *Deleted

## 2015-06-27 ENCOUNTER — Encounter (HOSPITAL_COMMUNITY): Payer: Self-pay | Admitting: Surgery

## 2015-06-27 ENCOUNTER — Ambulatory Visit (HOSPITAL_COMMUNITY)
Admission: RE | Admit: 2015-06-27 | Discharge: 2015-06-27 | Disposition: A | Discharge status: Home / Self Care | Payer: Commercial Managed Care - HMO | Source: Ambulatory Visit | Attending: Surgery | Admitting: Surgery

## 2015-06-27 ENCOUNTER — Encounter (HOSPITAL_COMMUNITY)
Admission: RE | Disposition: A | Discharge status: Home / Self Care | Payer: Self-pay | Source: Ambulatory Visit | Attending: Surgery

## 2015-06-27 DIAGNOSIS — Z7902 Long term (current) use of antithrombotics/antiplatelets: Secondary | ICD-10-CM | POA: Diagnosis not present

## 2015-06-27 DIAGNOSIS — N400 Enlarged prostate without lower urinary tract symptoms: Secondary | ICD-10-CM | POA: Insufficient documentation

## 2015-06-27 DIAGNOSIS — Z7982 Long term (current) use of aspirin: Secondary | ICD-10-CM | POA: Diagnosis not present

## 2015-06-27 DIAGNOSIS — M109 Gout, unspecified: Secondary | ICD-10-CM | POA: Diagnosis not present

## 2015-06-27 DIAGNOSIS — Z87891 Personal history of nicotine dependence: Secondary | ICD-10-CM | POA: Diagnosis not present

## 2015-06-27 DIAGNOSIS — Z955 Presence of coronary angioplasty implant and graft: Secondary | ICD-10-CM | POA: Insufficient documentation

## 2015-06-27 DIAGNOSIS — I70211 Atherosclerosis of native arteries of extremities with intermittent claudication, right leg: Secondary | ICD-10-CM | POA: Insufficient documentation

## 2015-06-27 DIAGNOSIS — Z8249 Family history of ischemic heart disease and other diseases of the circulatory system: Secondary | ICD-10-CM | POA: Diagnosis not present

## 2015-06-27 DIAGNOSIS — I2510 Atherosclerotic heart disease of native coronary artery without angina pectoris: Secondary | ICD-10-CM | POA: Diagnosis not present

## 2015-06-27 DIAGNOSIS — G629 Polyneuropathy, unspecified: Secondary | ICD-10-CM | POA: Insufficient documentation

## 2015-06-27 DIAGNOSIS — I70411 Atherosclerosis of autologous vein bypass graft(s) of the extremities with intermittent claudication, right leg: Secondary | ICD-10-CM | POA: Diagnosis not present

## 2015-06-27 DIAGNOSIS — M1990 Unspecified osteoarthritis, unspecified site: Secondary | ICD-10-CM | POA: Insufficient documentation

## 2015-06-27 DIAGNOSIS — F329 Major depressive disorder, single episode, unspecified: Secondary | ICD-10-CM | POA: Insufficient documentation

## 2015-06-27 DIAGNOSIS — Z8673 Personal history of transient ischemic attack (TIA), and cerebral infarction without residual deficits: Secondary | ICD-10-CM | POA: Diagnosis not present

## 2015-06-27 DIAGNOSIS — E7800 Pure hypercholesterolemia, unspecified: Secondary | ICD-10-CM | POA: Insufficient documentation

## 2015-06-27 DIAGNOSIS — I252 Old myocardial infarction: Secondary | ICD-10-CM | POA: Insufficient documentation

## 2015-06-27 DIAGNOSIS — I10 Essential (primary) hypertension: Secondary | ICD-10-CM | POA: Diagnosis not present

## 2015-06-27 DIAGNOSIS — Z86010 Personal history of colonic polyps: Secondary | ICD-10-CM | POA: Diagnosis not present

## 2015-06-27 DIAGNOSIS — E039 Hypothyroidism, unspecified: Secondary | ICD-10-CM | POA: Diagnosis not present

## 2015-06-27 DIAGNOSIS — K219 Gastro-esophageal reflux disease without esophagitis: Secondary | ICD-10-CM | POA: Diagnosis not present

## 2015-06-27 DIAGNOSIS — F419 Anxiety disorder, unspecified: Secondary | ICD-10-CM | POA: Insufficient documentation

## 2015-06-27 DIAGNOSIS — R7303 Prediabetes: Secondary | ICD-10-CM | POA: Insufficient documentation

## 2015-06-27 HISTORY — PX: LOWER EXTREMITY ANGIOGRAM: SHX5508

## 2015-06-27 HISTORY — PX: PERIPHERAL VASCULAR CATHETERIZATION: SHX172C

## 2015-06-27 LAB — POCT I-STAT, CHEM 8
BUN: 36 mg/dL — ABNORMAL HIGH (ref 6–20)
CALCIUM ION: 1.21 mmol/L (ref 1.13–1.30)
CHLORIDE: 106 mmol/L (ref 101–111)
Creatinine, Ser: 1.4 mg/dL — ABNORMAL HIGH (ref 0.61–1.24)
Glucose, Bld: 106 mg/dL — ABNORMAL HIGH (ref 65–99)
HEMATOCRIT: 41 % (ref 39.0–52.0)
Hemoglobin: 13.9 g/dL (ref 13.0–17.0)
POTASSIUM: 3.9 mmol/L (ref 3.5–5.1)
SODIUM: 144 mmol/L (ref 135–145)
TCO2: 25 mmol/L (ref 0–100)

## 2015-06-27 SURGERY — ABDOMINAL AORTOGRAM
Laterality: Right

## 2015-06-27 MED ORDER — HEPARIN (PORCINE) IN NACL 2-0.9 UNIT/ML-% IJ SOLN
INTRAMUSCULAR | Status: AC
  Filled 2015-06-27: qty 1000

## 2015-06-27 MED ORDER — DOCUSATE SODIUM 100 MG PO CAPS: 100.0000 mg | ORAL_CAPSULE | Freq: Every day | ORAL | Status: DC

## 2015-06-27 MED ORDER — OXYCODONE HCL 5 MG PO TABS: 5.0000 mg | ORAL_TABLET | ORAL | Status: DC | PRN

## 2015-06-27 MED ORDER — GUAIFENESIN-DM 100-10 MG/5ML PO SYRP: 15.0000 mL | ORAL_SOLUTION | ORAL | Status: DC | PRN

## 2015-06-27 MED ORDER — MORPHINE SULFATE (PF) 10 MG/ML IV SOLN: 2.0000 mg | INTRAVENOUS | Status: DC | PRN

## 2015-06-27 MED ORDER — ALUM & MAG HYDROXIDE-SIMETH 200-200-20 MG/5ML PO SUSP: 15.0000 mL | ORAL | Status: DC | PRN

## 2015-06-27 MED ORDER — LIDOCAINE HCL (PF) 1 % IJ SOLN
INTRAMUSCULAR | Status: AC
  Filled 2015-06-27: qty 30

## 2015-06-27 MED ORDER — HEPARIN (PORCINE) IN NACL 2-0.9 UNIT/ML-% IJ SOLN
INTRAMUSCULAR | Status: DC | PRN
Start: 2015-06-27 — End: 2015-06-27
  Administered 2015-06-27: 1000 mL

## 2015-06-27 MED ORDER — PHENOL 1.4 % MT LIQD: 1.0000 | OROMUCOSAL | Status: DC | PRN

## 2015-06-27 MED ORDER — SODIUM CHLORIDE 0.9 % IV SOLN
INTRAVENOUS | Status: DC
Start: 2015-06-27 — End: 2015-06-27
  Administered 2015-06-27: 07:00:00 via INTRAVENOUS

## 2015-06-27 MED ORDER — HYDRALAZINE HCL 20 MG/ML IJ SOLN: 5.0000 mg | INTRAMUSCULAR | Status: DC | PRN

## 2015-06-27 MED ORDER — ACETAMINOPHEN 325 MG RE SUPP: 325.0000 mg | RECTAL | Status: DC | PRN

## 2015-06-27 MED ORDER — MIDAZOLAM HCL 2 MG/2ML IJ SOLN
INTRAMUSCULAR | Status: AC
  Filled 2015-06-27: qty 2

## 2015-06-27 MED ORDER — ONDANSETRON HCL 4 MG/2ML IJ SOLN: 4.0000 mg | Freq: Four times a day (QID) | INTRAMUSCULAR | Status: DC | PRN

## 2015-06-27 MED ORDER — MIDAZOLAM HCL 2 MG/2ML IJ SOLN
INTRAMUSCULAR | Status: DC | PRN
  Administered 2015-06-27: 1 mg via INTRAVENOUS

## 2015-06-27 MED ORDER — FENTANYL CITRATE (PF) 100 MCG/2ML IJ SOLN
INTRAMUSCULAR | Status: DC | PRN
  Administered 2015-06-27: 25 ug via INTRAVENOUS

## 2015-06-27 MED ORDER — SODIUM CHLORIDE 0.9 % IV SOLN: 1.0000 mL/kg/h | INTRAVENOUS | Status: DC

## 2015-06-27 MED ORDER — FENTANYL CITRATE (PF) 100 MCG/2ML IJ SOLN
INTRAMUSCULAR | Status: AC
  Filled 2015-06-27: qty 2

## 2015-06-27 MED ORDER — LIDOCAINE HCL (PF) 1 % IJ SOLN
INTRAMUSCULAR | Status: DC | PRN
  Administered 2015-06-27: 12 mL

## 2015-06-27 MED ORDER — IODIXANOL 320 MG/ML IV SOLN
INTRAVENOUS | Status: DC | PRN
  Administered 2015-06-27: 105 mL via INTRAVENOUS

## 2015-06-27 MED ORDER — ACETAMINOPHEN 325 MG PO TABS: 325.0000 mg | ORAL_TABLET | ORAL | Status: DC | PRN

## 2015-06-27 SURGICAL SUPPLY — 11 items
CATH ANGIO 5F BER2 65CM (CATHETERS) ×1 IMPLANT
CATH OMNI FLUSH 5F 65CM (CATHETERS) ×1 IMPLANT
COVER PRB 48X5XTLSCP FOLD TPE (BAG) IMPLANT
COVER PROBE 5X48 (BAG) ×3
KIT MICROINTRODUCER STIFF 5F (SHEATH) ×1 IMPLANT
KIT PV (KITS) ×3 IMPLANT
SHEATH PINNACLE 5F 10CM (SHEATH) ×1 IMPLANT
SYR MEDRAD MARK V 150ML (SYRINGE) ×3 IMPLANT
TRANSDUCER W/STOPCOCK (MISCELLANEOUS) ×3 IMPLANT
TRAY PV CATH (CUSTOM PROCEDURE TRAY) ×3 IMPLANT
WIRE BENTSON .035X145CM (WIRE) ×1 IMPLANT

## 2015-06-27 NOTE — H&P (View-Only) (Signed)
[facility] [place]  Patient name: Ian Cummings. MRN: [id number] DOB: May 05, 1928 Sex: male  REASON FOR VISIT: right eg pain  HPI: Ian Cummings. is a 82 y.o. male who presents with worsening right leg pain over the past several months. He was last seen in the office on 02/05/2015. He underwent angiography on 01/23/2015 that revealed bilateral superficial femoral femoral artery occlusion and right common femoral stenosis and proximal profunda femoral stenosis. He was offered right femoral endarterectomy, but decided to not pursue surgery. He now complains of right rest pain.  His right hip has also been bothering him. He is able to ambulate only 50 yards before having to stop secondary to pain in his right thigh and calf. He does not have any nonhealing wounds.He states that his right leg pain is significantly interfering with his daily activities. He does note issues with left leg claudication but this is tolerable.  He has a past medical history of CAD status post stenting with early stent thrombosis. He has hypertension managed on multiple antihypertensives. His hypercholesterolemia is managed with a statin. He is on Plavix. He is a former smoker.  Past Medical History  Diagnosis Date  . CAD (coronary artery disease)   . Hyperlipidemia   . Hypertension   . GERD (gastroesophageal reflux disease)   . MI (myocardial infarction) ([place]) 4/14  . Gout   . BPH (benign prostatic hypertrophy)   . Carotid artery disease ([place])     Right carotid stent 2013  . TIA (transient ischemic attack)     Hx: of  . H/O hiatal hernia   . Neuropathy (HCC)     Hx: of in B/L toes  . Stroke ([place])   . AAA (abdominal aortic aneurysm) ([place])     2004, Pt stated not there now  . Peripheral vascular disease ([place])     with claudication  . Anxiety   . Depression   . Pre-diabetes   . Hypothyroidism   . Colon polyps     adenomatous  . Arthritis     Family History  Problem  Relation Age of Onset  . Heart disease Mother     After 18 yrs of age  . Heart attack Mother   . Hyperlipidemia Father   . Hypertension Father   . Heart disease Father     After 7 yrs of age  . Diabetes Father   . Stroke Sister   . Colon cancer Neg Hx   . Stomach cancer Neg Hx   . Rectal cancer Neg Hx   . Esophageal cancer Neg Hx   . Liver disease Neg Hx   . Kidney disease Neg Hx     SOCIAL HISTORY: Social History  Substance Use Topics  . Smoking status: Former Smoker    Types: Cigarettes    Quit date: 04/29/1975  . Smokeless tobacco: Never Used  . Alcohol Use: 10.8 - 12.0 oz/week    12 Glasses of wine, 6-8 Shots of liquor per week     Comment: Occasional drink    Allergies  Allergen Reactions  . Metoprolol Itching    Current Outpatient Prescriptions  Medication Sig Dispense Refill  . allopurinol (ZYLOPRIM) 300 MG tablet Take 300 mg by mouth 2 (two) times daily.     Marland Kitchen amLODipine (NORVASC) 10 MG tablet Take 10 mg by mouth daily.    Marland Kitchen aspirin EC 81 MG tablet Take 81 mg by mouth daily.    Marland Kitchen  atenolol (TENORMIN) 50 MG tablet Take 50 mg by mouth daily.    . cholecalciferol (VITAMIN D) 1000 UNITS tablet Take 2,000 Units by mouth daily.    . clopidogrel (PLAVIX) 75 MG tablet Take 75 mg by mouth daily.    . fish oil-omega-3 fatty acids 1000 MG capsule Take 1 g by mouth daily.     . hydrochlorothiazide (HYDRODIURIL) 12.5 MG tablet Take 25 mg by mouth daily.     . lansoprazole (PREVACID) 15 MG capsule Take 15 mg by mouth daily.    Marland Kitchen levothyroxine (SYNTHROID, LEVOTHROID) 88 MCG tablet Take 88 mcg by mouth daily before breakfast.    . lisinopril (PRINIVIL,ZESTRIL) 20 MG tablet Take 20 mg by mouth daily.    . multivitamin (RENA-VIT) TABS tablet Take 1 tablet by mouth daily.    Marland Kitchen PARoxetine (PAXIL) 20 MG tablet Take 20 mg by mouth every morning.    . ramipril (ALTACE) 2.5 MG tablet Take 2.5 mg by mouth daily.     . rosuvastatin (CRESTOR) 40 MG tablet Take 40 mg by mouth daily.      . vitamin B-12 (CYANOCOBALAMIN) 1000 MCG tablet Take 1,000 mcg by mouth daily.     No current facility-administered medications for this visit.    REVIEW OF SYSTEMS:  [X]  denotes positive finding, [ ]  denotes negative finding Cardiac  Comments:  Chest pain or chest pressure:    Shortness of breath upon exertion:    Short of breath when lying flat:    Irregular heart rhythm:        Vascular    Pain in calf, thigh, or hip brought on by ambulation: x Right thigh and foot.   Pain in feet at night that wakes you up from your sleep:  x   Blood clot in your veins:    Leg swelling:         Pulmonary    Oxygen at home:    Productive cough:     Wheezing:         Neurologic    Sudden weakness in arms or legs:     Sudden numbness in arms or legs:     Sudden onset of difficulty speaking or slurred speech:    Temporary loss of vision in one eye:     Problems with dizziness:         Gastrointestinal    Blood in stool:     Vomited blood:         Genitourinary    Burning when urinating:     Blood in urine:        Psychiatric    Major depression:         Hematologic    Bleeding problems:    Problems with blood clotting too easily:        Skin    Rashes or ulcers:        Constitutional    Fever or chills:      PHYSICAL EXAM: Filed Vitals:   06/25/15 1509  BP: 110/50  Pulse: 50  Temp: 98.2 F (36.8 C)  TempSrc: Oral  Resp: 14  Height: 5\' 10"  (1.778 m)  Weight: 173 lb (78.472 kg)  SpO2: 98%    GENERAL: The patient is a well-nourished male, in no acute distress. The vital signs are documented above. CARDIAC: There is a regular rate and rhythm. Right carotid bruit. VASCULAR: 2+ femoral pulses bilaterally. Nonpalpable popliteal and pedal pulses bilaterally. Right foot with red/purple discoloration at toes.  No wounds seen. PULMONARY: There is good air exchange bilaterally without wheezing or rales. MUSCULOSKELETAL: There are no major deformities or  cyanosis. NEUROLOGIC: No focal weakness or paresthesias are detected. SKIN: There are no ulcers or rashes noted. PSYCHIATRIC: The patient has a normal affect.   MEDICAL ISSUES: Atherosclerosis of the native arteries with rest pain (right)  The patient has had significant worsening of right leg pain in the last several months. He now has rest pain on the right. The patient last had an arteriogram in September 2016. This revealed bilateral superficial femoral artery occlusion and right common femoral stenosis and proximal profunda femoral stenosis. The patient would like to proceed with surgery given severity of his symptoms. We will need to repeat angiography before proceeding with surgery. He may require a bypass. He does not have adequate saphenous vein bilaterally and will need a prosthetic bypass graft. His arteriogram will be scheduled for 06/27/2015 with Dr. Trula Slade. He will continue his Plavix.  Virgina Jock, PA-C [facility] [place]    I agree with the above.  I have seen and evaluated the patient.  Briefly, I evaluated him for claudication 6 months ago.  He had angiogram that showed a right common femoral stenosis and bilateral superficial femoral artery occlusion.  In order to revascularize his right leg he would have needed a right femoral to below-knee popliteal bypass graft with right femoral endarterectomy.  He did not have an adequate vein and therefore I contemplated just doing a right femoral endarterectomy.  The patient did not feel his symptoms were severe enough and wanted to try to manage this without surgery.  Over the past several weeks the pain has gotten worse to the point that he is having rest pain.  I think that he needs to undergo angiography to see what has changed.  He still has palpable femoral pulses bilaterally.  Angiography has been scheduled for Wednesday, March 1.  I will make a surgical plan based on these images. He is on Plavix and this  will need to be discontinued prior to surgery  Annamarie Major

## 2015-06-27 NOTE — Progress Notes (Addendum)
Site area: LFA Site Prior to Removal:  Level 0 Pressure Applied For:20 min Manual:  yes  Patient Status During Pull:  stable Post Pull Site:  Level 0 Post Pull Instructions Given:  yes Post Pull Pulses Present:palpable  Dressing Applied:  clear Bedrest begins @ 1005 till 1405 Comments:

## 2015-06-27 NOTE — Op Note (Signed)
    Patient name: Ian Cummings. MRN: [id number] DOB: 10-12-1928 Sex: male  06/27/2015 Pre-operative Diagnosis: Right leg claudication Post-operative diagnosis:  Same Surgeon:  Annamarie Major Procedure Performed:  1.  U/s guided access, left common femoral artery  2.  Abdominal aortogram  3.  Second order catheterization  4.  Right leg runoff  5.  Conscious sedation (8:55-9:24)     Indications:  The patient has developed worsening claudication symptoms particularly in his right thigh and hip.  He is here for repeat angiogram.  Procedure:  The patient was identified in the holding area and taken to room 8.  The patient was then placed supine on the table and prepped and draped in the usual sterile fashion.  A time out was called.  The procedure was performed with conscious sedation using IV fentanyl and Versed under continuous monitoring by the physician and circulating nurse.  Heart rate, blood pressure, and oxygen saturations were monitored.  I was present for 100% of the procedure.  Conscious sedation time was from 8:55-9:24Ultrasound was used to evaluate the left common femoral artery.  It was patent .  A digital ultrasound image was acquired.  A micropuncture needle was used to access the left common femoral artery under ultrasound guidance.  An 018 wire was advanced without resistance and a micropuncture sheath was placed.  The 018 wire was removed and a benson wire was placed.  The micropuncture sheath was exchanged for a 5 french sheath.  An omniflush catheter was advanced over the wire to the level of L-1.  An abdominal angiogram was obtained.  Next, using the omniflush catheter and a benson wire, the aortic bifurcation was crossed and the catheter was placed into theright external iliac artery and right runoff was obtained.    Findings:   Aortogram:  The right kidney is not well visualized however there does appear to be no significant renal artery stenosis.  The infrarenal abdominal  aorta is widely patent.  Bilateral common iliac arteries are widely patent.  The right external iliac artery is patent.  The left external iliac artery is patent.  Right Lower Extremity:  High-grade stenosis, greater than 80% is noted in the mid common femoral artery.  There is also a high-grade greater than 90% proximal profunda femoral artery stenosis.  The superficial femoral artery is occluded.  There is reconstitution of a diseased above-knee popliteal artery.  The bloody popliteal artery is patent throughout it's course with single-vessel runoff via the peroneal artery which does reconstitute a dorsalis pedis artery at the foot.  Left Lower Extremity:  Not evaluated  Intervention:   none   Impression:  #1  progression of right common femoral and proximal right profunda femoral artery stenosis  #2   occluded right superficial femoral artery with reconstitution of the above-knee popliteal artery which is very diseased distally.  The below knee popliteal artery is patent and would be a better bypass target with single-vessel runoff via the peroneal artery     V. Annamarie Major, M.D. [facility] Office: [phone] Pager:  [phone]

## 2015-06-27 NOTE — Interval H&P Note (Signed)
History and Physical Interval Note:  06/27/2015 8:46 AM  [name].  has presented today for surgery, with the diagnosis of pvd with right lower extremity rest pain  The various methods of treatment have been discussed with the patient and family. After consideration of risks, benefits and other options for treatment, the patient has consented to  Procedure(s): Abdominal Aortogram (N/A) as a surgical intervention .  The patient's history has been reviewed, patient examined, no change in status, stable for surgery.  I have reviewed the patient's chart and labs.  Questions were answered to the patient's satisfaction.     Ian Cummings

## 2015-06-27 NOTE — Discharge Instructions (Signed)

## 2015-06-28 ENCOUNTER — Telehealth: Payer: Self-pay | Admitting: Surgery

## 2015-06-28 DIAGNOSIS — M5441 Lumbago with sciatica, right side: Secondary | ICD-10-CM | POA: Diagnosis not present

## 2015-06-28 DIAGNOSIS — M7061 Trochanteric bursitis, right hip: Secondary | ICD-10-CM | POA: Diagnosis not present

## 2015-06-28 NOTE — Telephone Encounter (Signed)
No answer, no voicemail- mailed letter, dpm °

## 2015-06-28 NOTE — Telephone Encounter (Signed)
-----   Message from Mena Goes, RN sent at 06/27/2015 10:28 AM EST ----- Regarding: schedule   ----- Message -----    From: Serafina Mitchell, MD    Sent: 06/27/2015  10:00 AM      To: Vvs Charge Pool  Needs f/u in office in 2 weeks to discuss femoral endarterectomy

## 2015-07-11 ENCOUNTER — Ambulatory Visit
Admission: RE | Admit: 2015-07-11 | Discharge: 2015-07-11 | Disposition: A | Discharge status: Home / Self Care | Payer: Commercial Managed Care - HMO | Source: Ambulatory Visit | Attending: Orthopaedic Surgery | Admitting: Orthopaedic Surgery

## 2015-07-11 ENCOUNTER — Other Ambulatory Visit: Payer: Self-pay | Admitting: Orthopaedic Surgery

## 2015-07-11 ENCOUNTER — Encounter: Payer: Self-pay | Admitting: Surgery

## 2015-07-11 DIAGNOSIS — M4806 Spinal stenosis, lumbar region: Secondary | ICD-10-CM | POA: Diagnosis not present

## 2015-07-11 DIAGNOSIS — M545 Low back pain: Secondary | ICD-10-CM

## 2015-07-12 DIAGNOSIS — M5441 Lumbago with sciatica, right side: Secondary | ICD-10-CM | POA: Diagnosis not present

## 2015-07-16 ENCOUNTER — Ambulatory Visit: Payer: Commercial Managed Care - HMO | Admitting: Surgery

## 2015-07-17 DIAGNOSIS — L97411 Non-pressure chronic ulcer of right heel and midfoot limited to breakdown of skin: Secondary | ICD-10-CM | POA: Diagnosis not present

## 2015-07-17 DIAGNOSIS — I70211 Atherosclerosis of native arteries of extremities with intermittent claudication, right leg: Secondary | ICD-10-CM | POA: Diagnosis not present

## 2015-07-20 ENCOUNTER — Encounter: Payer: Self-pay | Admitting: Surgery

## 2015-07-24 DIAGNOSIS — M5416 Radiculopathy, lumbar region: Secondary | ICD-10-CM | POA: Diagnosis not present

## 2015-07-24 DIAGNOSIS — M4807 Spinal stenosis, lumbosacral region: Secondary | ICD-10-CM | POA: Diagnosis not present

## 2015-07-30 ENCOUNTER — Ambulatory Visit (INDEPENDENT_AMBULATORY_CARE_PROVIDER_SITE_OTHER): Payer: Commercial Managed Care - HMO | Admitting: Surgery

## 2015-07-30 ENCOUNTER — Encounter: Payer: Self-pay | Admitting: Surgery

## 2015-07-30 ENCOUNTER — Other Ambulatory Visit: Payer: Self-pay

## 2015-07-30 VITALS — BP 117/56 | HR 50 | Temp 97.7°F | Ht 70.0 in | Wt 162.0 lb

## 2015-07-30 DIAGNOSIS — I7025 Atherosclerosis of native arteries of other extremities with ulceration: Secondary | ICD-10-CM

## 2015-07-30 NOTE — Progress Notes (Signed)
Patient name: Ian Cummings. MRN: [id number] DOB: [birth year] Sex: male     Chief Complaint  Patient presents with  . Re-evaluation    2 wk f/u to discuss fem endarterctomy- L heel ulcer    HISTORY OF PRESENT ILLNESS: The patient is back for further discussions regarding his right leg.  Unfortunately in the interim since I last saw him, he has developed a 2 week history of a right heel ulcer which is extremely painful.  At his last arteriogram, he was found to have progression of the right common femoral stenosis as well as an occluded right superficial femoral artery with reconstitution of the below knee popliteal artery and single vessel runoff via the anterior tibial artery.  The patient has a history of carotid stenting at [facility] in 2013.  He has significant coronary artery history, status post CABG 2.  He is medically managed for hypercholesterolemia with a statin.  His blood pressure has been well controlled.  He is a former smoker.  Past Medical History  Diagnosis Date  . CAD (coronary artery disease)   . Hyperlipidemia   . Hypertension   . GERD (gastroesophageal reflux disease)   . MI (myocardial infarction) ([place]) 4/14  . Gout   . BPH (benign prostatic hypertrophy)   . Carotid artery disease ([place])     Right carotid stent 2013  . TIA (transient ischemic attack)     Hx: of  . H/O hiatal hernia   . Neuropathy ([facility])     Hx: of in B/L toes  . Stroke ([place])   . AAA (abdominal aortic aneurysm) ([place])     2004, Pt stated not there now  . Peripheral vascular disease ([place])     with claudication  . Anxiety   . Depression   . Pre-diabetes   . Hypothyroidism   . Colon polyps     adenomatous  . Arthritis     Past Surgical History  Procedure Laterality Date  . Coronary artery bypass graft  1995  . Foot surgery Left   . Coronary angioplasty with stent placement  2010  . Inguinal hernia repair Bilateral     X 2   . Carotid stent insertion Right 2013   right at [facility]  . Tonsillectomy    . Colonoscopy      Hx: of  . Retinal detachment surgery Left     Hx: of left eye  . Cataract extraction Bilateral     Hx: of both eyes  . Fracture surgery Left     Hx: of Left heel  . Coronary artery bypass graft N/A 10/18/2012    Procedure: REDO CORONARY ARTERY BYPASS GRAFTING (CABG);  Surgeon: Grace Isaac, MD;  Location: [place];  Service: Open Heart Surgery;  Laterality: N/A;  off pump times two using endoscopically harvested left saphenous vein  . Intraoperative transesophageal echocardiogram N/A 10/18/2012    Procedure: INTRAOPERATIVE TRANSESOPHAGEAL ECHOCARDIOGRAM;  Surgeon: Grace Isaac, MD;  Location: [place];  Service: Open Heart Surgery;  Laterality: N/A;  . Mastoidectomy  1933  . Left heart catheterization with coronary/graft angiogram N/A 02/02/2013    Procedure: LEFT HEART CATHETERIZATION WITH Beatrix Fetters;  Surgeon: Jacolyn Reedy, MD;  Location: [facility] CATH LAB;  Service: Cardiovascular;  Laterality: N/A;  . Peripheral vascular catheterization N/A 01/23/2015    Procedure: Abdominal Aortogram;  Surgeon: Serafina Mitchell, MD;  Location: [place] CV LAB;  Service: Cardiovascular;  Laterality: N/A;  .  Peripheral vascular catheterization N/A 06/27/2015    Procedure: Abdominal Aortogram;  Surgeon: Serafina Mitchell, MD;  Location: [place] CV LAB;  Service: Cardiovascular;  Laterality: N/A;  . Lower extremity angiogram Right 06/27/2015    Procedure: Lower Extremity Angiogram;  Surgeon: Serafina Mitchell, MD;  Location: [place] CV LAB;  Service: Cardiovascular;  Laterality: Right;    Social History   Social History  . Marital Status: Married    Spouse Name: N/A  . Number of Children: 3  . Years of Education: N/A   Occupational History  . retired    Social History Main Topics  . Smoking status: Former Smoker    Types: Cigarettes    Quit date: 04/29/1975  . Smokeless tobacco: Never Used  . Alcohol Use: 10.8 -  12.0 oz/week    12 Glasses of wine, 6-8 Shots of liquor per week     Comment: Occasional drink  . Drug Use: No  . Sexual Activity: Not Currently   Other Topics Concern  . 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 Heart disease Mother     After 37 yrs of age  . Heart attack Mother   . Hyperlipidemia Father   . Hypertension Father   . Heart disease Father     After 104 yrs of age  . Diabetes Father   . Stroke Sister   . Colon cancer Neg Hx   . Stomach cancer Neg Hx   . Rectal cancer Neg Hx   . Esophageal cancer Neg Hx   . Liver disease Neg Hx   . Kidney disease Neg Hx     Allergies as of 07/30/2015 - Review Complete 07/30/2015  Allergen Reaction Noted  . Metoprolol Itching 12/20/2012    Current Outpatient Prescriptions on File Prior to Visit  Medication Sig Dispense Refill  . allopurinol (ZYLOPRIM) 100 MG tablet Take 200 mg by mouth daily.    Marland Kitchen amLODipine (NORVASC) 10 MG tablet Take 10 mg by mouth daily.    Marland Kitchen aspirin EC 81 MG tablet Take 81 mg by mouth daily.    Marland Kitchen atenolol (TENORMIN) 50 MG tablet Take 50 mg by mouth daily.    . cholecalciferol (VITAMIN D) 1000 UNITS tablet Take 2,000 Units by mouth daily.    . clopidogrel (PLAVIX) 75 MG tablet Take 75 mg by mouth daily.    . fish oil-omega-3 fatty acids 1000 MG capsule Take 1 g by mouth daily.     . hydrochlorothiazide (HYDRODIURIL) 25 MG tablet Take 25 mg by mouth daily.    . lansoprazole (PREVACID) 15 MG capsule Take 15 mg by mouth daily as needed.     Marland Kitchen levothyroxine (SYNTHROID, LEVOTHROID) 88 MCG tablet Take 88 mcg by mouth daily before breakfast.    . lisinopril (PRINIVIL,ZESTRIL) 20 MG tablet Take 20 mg by mouth daily.    . multivitamin (RENA-VIT) TABS tablet Take 1 tablet by mouth daily.    Marland Kitchen PARoxetine (PAXIL) 20 MG tablet Take 20 mg by mouth every morning.    . ramipril (ALTACE) 2.5 MG tablet Take 2.5 mg by mouth daily.     .  rosuvastatin (CRESTOR) 40 MG tablet Take 40 mg by mouth daily.    . vitamin B-12 (CYANOCOBALAMIN) 1000 MCG tablet Take 1,000 mcg by mouth daily.     No current facility-administered medications on file prior to visit.  REVIEW OF SYSTEMS: Cardiovascular: No chest pain, chest pressure,  claudication And rest pain,   Pulmonary: No productive cough, asthma or wheezing. Neurologic: No weakness, paresthesias, aphasia, or amaurosis. No dizziness. Hematologic: No bleeding problems or clotting disorders. Musculoskeletal: No joint pain or joint swelling. Gastrointestinal: No blood in stool or hematemesis Genitourinary: No dysuria or hematuria. Psychiatric:: No history of major depression. Integumentary: Right heel ulcer with pain. Constitutional: No fever or chills.  PHYSICAL EXAMINATION:   Vital signs are  Filed Vitals:   07/30/15 1447  BP: 117/56  Pulse: 50  Temp: 97.7 F (36.5 C)  TempSrc: Oral  Height: 5\' 10"  (1.778 m)  Weight: 162 lb (73.483 kg)  SpO2: 97%   Body mass index is 23.24 kg/(m^2). General: The patient appears their stated age. HEENT:  No gross abnormalities Pulmonary:  Non labored breathing Abdomen: Soft and non-tender Musculoskeletal: There are no major deformities. Neurologic: No focal weakness or paresthesias are detected, Skin: "X"Shaped ulcer on the posterior side of the right heel Psychiatric: The patient has normal affect. Cardiovascular: There is a regular rate and rhythm without significant murmur appreciated.   Diagnostic Studies None  Assessment: Right leg ulcer Plan: I discussed 2 options with the patient.  The first would be a right femoral endarterectomy, the second would be a right femoral endarterectomy with a femoral to below-knee popliteal bypass graft with Gore-Tex.  His vein has been removed for CABG.  Because of the ulcer, I feel that in addition to femoral endarterectomy, I need to do a below knee bypass with Gore-Tex.  I discussed the  risks and benefits of the operation including the risk of limb loss and bypass graft failure as well as infection and cardiopulmonary concerns.  He will need to be off of his Plavix beginning today.  I have scheduled his right femoral endarterectomy with patch angioplasty and right femoral to below-knee popliteal artery bypass graft for this Friday, April 7.  I will get cardiology clearance from Dr. Roger Kill. Leia Alf, M.D. [facility] Office: [phone] Pager:  [phone]

## 2015-07-31 ENCOUNTER — Encounter: Payer: Self-pay | Admitting: Cardiology

## 2015-07-31 DIAGNOSIS — E785 Hyperlipidemia, unspecified: Secondary | ICD-10-CM | POA: Diagnosis not present

## 2015-07-31 DIAGNOSIS — I2510 Atherosclerotic heart disease of native coronary artery without angina pectoris: Secondary | ICD-10-CM | POA: Diagnosis not present

## 2015-07-31 DIAGNOSIS — I70234 Atherosclerosis of native arteries of right leg with ulceration of heel and midfoot: Secondary | ICD-10-CM | POA: Diagnosis not present

## 2015-07-31 DIAGNOSIS — Z01810 Encounter for preprocedural cardiovascular examination: Secondary | ICD-10-CM | POA: Diagnosis not present

## 2015-07-31 DIAGNOSIS — Z951 Presence of aortocoronary bypass graft: Secondary | ICD-10-CM | POA: Diagnosis not present

## 2015-07-31 DIAGNOSIS — I70219 Atherosclerosis of native arteries of extremities with intermittent claudication, unspecified extremity: Secondary | ICD-10-CM | POA: Diagnosis not present

## 2015-07-31 DIAGNOSIS — I6523 Occlusion and stenosis of bilateral carotid arteries: Secondary | ICD-10-CM | POA: Diagnosis not present

## 2015-07-31 DIAGNOSIS — I25719 Atherosclerosis of autologous vein coronary artery bypass graft(s) with unspecified angina pectoris: Secondary | ICD-10-CM | POA: Diagnosis not present

## 2015-07-31 NOTE — Progress Notes (Signed)
Patient ID: Ian Cummings., male   DOB: [birth year], 82 y.o.   MRN: [id number]   Ian, Cummings  Date of visit:  07/31/2015 DOB:  10-05-1928    Age:  82 yrs. Medical record number:  [id number]     Account number:  [id number] Primary Care Provider: Lutricia Feil ____________________________ CURRENT DIAGNOSES  1. Atherosclerosis of autologous vein coronary artery bypass graft(s) with unspecified angina pectoris  2. CAD Native without angina  3. Encounter for preprocedural cardiovascular examination  4. Hyperlipidemia  5. Carotid artery disease-Bilateral  6. Atherosclerosis Of Native Arteries Of Right Leg With Ulceration Of Heel And Midfoot  7. Presence of aortocoronary bypass graft ____________________________ ALLERGIES  Metoprolol, Rash, itching ____________________________ MEDICATIONS  1. multivitamin tablet, 1 p.o. daily  2. clopidogrel 75 mg tablet, 1 p.o. daily  3. Fish Oil 340-1,000 mg capsule, 1 p.o. daily  4. paroxetine HCl 20 mg tablet, 1 p.o. daily  5. Vitamin B-12 1,000 mcg tablet, 1 p.o. daily  6. aspirin 81 mg tablet,chewable, 1 p.o. daily  7. atenolol 50 mg tablet, 1 p.o. daily  8. hydrochlorothiazide 12.5 mg tablet, 2 qd  9. allopurinol 100 mg tablet, BID  10. levothyroxine 88 mcg tablet, 1 p.o. daily  11. lisinopril 20 mg tablet, 1 p.o. daily  12. lansoprazole 15 mg capsule,delayed release, 1 p.o. daily  13. amlodipine 10 mg tablet, 1 p.o. daily ____________________________ HISTORY OF PRESENT ILLNESS Patient seen for preoperative cardiac evaluation. The patient developed a nonhealing ulcer of his right heel and has been found to have severe peripheral vascular disease and is need of an operation on his right leg to repair this. He has painful issues with his right leg now since January as well as what he considers to be gout in the foot. He has a prior history of coronary artery disease and had redo bypass grafting about 3 years ago. He has not had much in the way of  angina since then although he does have an occluded graft to the marginal system and will have very intermittent angina. His major trouble is claudication. He denies PND, orthopnea, syncope, or claudication. ____________________________ PAST HISTORY  Past Medical Illnesses:  hyperlipidemia, claudication-BLE, BPH, hypertension, gout, GERD;  Cardiovascular Illnesses:  CAD, peripheral vascular disease, Carotid artery disease;  Surgical Procedures:  CABG, carotid stent, inguinal herniorrhaphy-rt, inguinal herniorrhaphy-left, lt foot surgery, cataract extraction OU, tonsillectomy;  NYHA Classification:  II;  Canadian Angina Classification:  Class 0: Asymptomatic;  Cardiology Procedures-Invasive:  CABG w LIMA to LAD, SVG to OM1-OM2, SVG to dx, SVG to PD/PL 1995, Cypher stent x 2 SVG to RCA and Cypher stent to Circ Graft 05/13/07, stent of SVG to circ OM in  [place] in July 2013, redo CABG off pump Dr. Servando Snare 10/18/12, cardiac cath (left) October 2014;  Cardiology Procedures-Noninvasive:  echocardiogram, treadmill cardiolite;  Cardiac Cath Results:  normal Left main, occluded mid LAD, occluded CFX, widely patent OM 1 SVG, occluded Diag 1 SVG, widely patent LAD LIMA graft, widely patent RCA SVG, 90% stenosis distal PL branch;  Peripheral Vascular Procedures:  carotid stent right 2013 [facility];  LVEF of 55% documented via echocardiogram on 10/14/2012,   ____________________________ Caleen Essex TEST DATES EKG Date:  07/31/2015;   Cardiac Cath Date:  02/02/2013;  CABG: 11/08/2012;  Stent Placement Date: 11/02/2011;  Echocardiography Date: 10/14/2012;  Chest Xray Date: 09/06/2012;   ____________________________ FAMILY HISTORY Brother -- Brother alive and well Father -- Father dead, Myocardial infarction at greater than 47 Mother -- Mother  dead, Unknown Disease Sister -- Sister dead, CVA Sister -- Hypertension ____________________________ SOCIAL HISTORY Alcohol Use:  wine 1-2 per day;  Smoking:  used to smoke  but quit Prior to 1980;  Lifestyle:  married;  Exercise:  some exercise;  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 Eyes: wears eye glasses/contact lenses, denies diplopia, glaucoma or visual field defects. Respiratory: denies dyspnea, cough, wheezing or hemoptysis. Cardiovascular:  please review HPI  Genitourinary-Male: nocturia, erectile dysfunction  Musculoskeletal:  arthritis of the hands, severe claudication Neurological:  denies headaches, stroke, or TIA  ____________________________ PHYSICAL EXAMINATION VITAL SIGNS  Blood Pressure:  94/54 Sitting, Left arm, large cuff  , 88/50 Standing, Left arm and large cuff   Pulse:  54/min. Weight:  167.00 lbs. Height:  71"BMI: 23  Constitutional:  pleasant white male in no acute distress Skin:  warm and dry to touch, no apparent skin lesions, or masses noted. Head:  normocephalic, normal hair pattern, no masses or tenderness Chest:  clear to auscultation, healed median sternotomy scar Cardiac:  regular rhythm, normal S1 and S2, No S3 or S4, no murmurs, gallops or rubs detected. Peripheral Pulses:  femoral pulses 2+, bilateral femoral bruits present, distal pulses diminished Extremities & Back:  well healed saphenous vein donor site RLE, no edema present Neurological:  no gross motor or sensory deficits noted, affect appropriate, oriented x3. ____________________________ MOST RECENT LIPID PANEL 12/04/14  CHOL TOTL 113 mg/dl, LDL 52 NM, HDL 38 mg/dl, TRIGLYCER 113 mg/dl, ALT 22 u/l, ALK PHOS 41 u/l, CHOL/HDL 3.0 (Calc) and AST 29 u/l ____________________________ IMPRESSIONS/PLAN  1. From a cardiovascular viewpoint may proceed with the planned vascular surgery. His cardiac status is stable and he has been revascularized in the past 3 years. He does have incomplete revascularization with a  known occluded marginal branch that may cause some angina. 2. Hyperlipidemia currently under treatment 3. Severe peripheral vascular disease with previous carotid artery stent 2013 4. Coronary artery disease with previous redo bypass grafting  Recommendations:  I would be happy to see in the hospital at the time of surgery should the need arise. He should continue his cardiac medications during the time of surgery. EKG shows sinus rhythm with no ischemic changes.   ____________________________ TODAYS ORDERS  1. Return Visit: keep appt  2. 12 Lead EKG: Today                       ____________________________ Cardiology Physician:  W. Spencer , Jr. MD FACC    

## 2015-08-01 ENCOUNTER — Encounter (HOSPITAL_COMMUNITY)
Admission: RE | Admit: 2015-08-01 | Discharge: 2015-08-01 | Disposition: A | Discharge status: Home / Self Care | Payer: Commercial Managed Care - HMO | Source: Ambulatory Visit | Attending: Surgery | Admitting: Surgery

## 2015-08-01 ENCOUNTER — Encounter (HOSPITAL_COMMUNITY): Payer: Self-pay

## 2015-08-01 DIAGNOSIS — I771 Stricture of artery: Secondary | ICD-10-CM | POA: Diagnosis not present

## 2015-08-01 DIAGNOSIS — Z955 Presence of coronary angioplasty implant and graft: Secondary | ICD-10-CM | POA: Diagnosis not present

## 2015-08-01 DIAGNOSIS — Z8249 Family history of ischemic heart disease and other diseases of the circulatory system: Secondary | ICD-10-CM | POA: Diagnosis not present

## 2015-08-01 DIAGNOSIS — E785 Hyperlipidemia, unspecified: Secondary | ICD-10-CM | POA: Diagnosis present

## 2015-08-01 DIAGNOSIS — Z888 Allergy status to other drugs, medicaments and biological substances status: Secondary | ICD-10-CM | POA: Diagnosis not present

## 2015-08-01 DIAGNOSIS — Z7982 Long term (current) use of aspirin: Secondary | ICD-10-CM | POA: Diagnosis not present

## 2015-08-01 DIAGNOSIS — Z823 Family history of stroke: Secondary | ICD-10-CM | POA: Diagnosis not present

## 2015-08-01 DIAGNOSIS — M795 Residual foreign body in soft tissue: Secondary | ICD-10-CM | POA: Diagnosis not present

## 2015-08-01 DIAGNOSIS — G629 Polyneuropathy, unspecified: Secondary | ICD-10-CM | POA: Diagnosis present

## 2015-08-01 DIAGNOSIS — L97419 Non-pressure chronic ulcer of right heel and midfoot with unspecified severity: Secondary | ICD-10-CM | POA: Diagnosis present

## 2015-08-01 DIAGNOSIS — Z6822 Body mass index (BMI) 22.0-22.9, adult: Secondary | ICD-10-CM | POA: Diagnosis not present

## 2015-08-01 DIAGNOSIS — Z951 Presence of aortocoronary bypass graft: Secondary | ICD-10-CM | POA: Diagnosis not present

## 2015-08-01 DIAGNOSIS — I70201 Unspecified atherosclerosis of native arteries of extremities, right leg: Secondary | ICD-10-CM | POA: Diagnosis present

## 2015-08-01 DIAGNOSIS — Z8673 Personal history of transient ischemic attack (TIA), and cerebral infarction without residual deficits: Secondary | ICD-10-CM | POA: Diagnosis not present

## 2015-08-01 DIAGNOSIS — F419 Anxiety disorder, unspecified: Secondary | ICD-10-CM | POA: Diagnosis present

## 2015-08-01 DIAGNOSIS — K219 Gastro-esophageal reflux disease without esophagitis: Secondary | ICD-10-CM | POA: Diagnosis present

## 2015-08-01 DIAGNOSIS — Z87891 Personal history of nicotine dependence: Secondary | ICD-10-CM | POA: Diagnosis not present

## 2015-08-01 DIAGNOSIS — I2510 Atherosclerotic heart disease of native coronary artery without angina pectoris: Secondary | ICD-10-CM | POA: Diagnosis present

## 2015-08-01 DIAGNOSIS — E440 Moderate protein-calorie malnutrition: Secondary | ICD-10-CM | POA: Diagnosis present

## 2015-08-01 DIAGNOSIS — L97919 Non-pressure chronic ulcer of unspecified part of right lower leg with unspecified severity: Secondary | ICD-10-CM | POA: Diagnosis present

## 2015-08-01 DIAGNOSIS — I252 Old myocardial infarction: Secondary | ICD-10-CM | POA: Diagnosis not present

## 2015-08-01 DIAGNOSIS — I10 Essential (primary) hypertension: Secondary | ICD-10-CM | POA: Diagnosis present

## 2015-08-01 DIAGNOSIS — Z7902 Long term (current) use of antithrombotics/antiplatelets: Secondary | ICD-10-CM | POA: Diagnosis not present

## 2015-08-01 DIAGNOSIS — E039 Hypothyroidism, unspecified: Secondary | ICD-10-CM | POA: Diagnosis present

## 2015-08-01 DIAGNOSIS — Z79899 Other long term (current) drug therapy: Secondary | ICD-10-CM | POA: Diagnosis not present

## 2015-08-01 DIAGNOSIS — L97409 Non-pressure chronic ulcer of unspecified heel and midfoot with unspecified severity: Secondary | ICD-10-CM | POA: Diagnosis not present

## 2015-08-01 HISTORY — DX: Angina pectoris, unspecified: I20.9

## 2015-08-01 LAB — CBC
HCT: 37.6 % — ABNORMAL LOW (ref 39.0–52.0)
Hemoglobin: 12.5 g/dL — ABNORMAL LOW (ref 13.0–17.0)
MCH: 30.7 pg (ref 26.0–34.0)
MCHC: 33.2 g/dL (ref 30.0–36.0)
MCV: 92.4 fL (ref 78.0–100.0)
PLATELETS: 200 10*3/uL (ref 150–400)
RBC: 4.07 MIL/uL — AB (ref 4.22–5.81)
RDW: 13.9 % (ref 11.5–15.5)
WBC: 6.8 10*3/uL (ref 4.0–10.5)

## 2015-08-01 LAB — PROTIME-INR
INR: 1.1 (ref 0.00–1.49)
Prothrombin Time: 14.4 seconds (ref 11.6–15.2)

## 2015-08-01 LAB — COMPREHENSIVE METABOLIC PANEL
ALBUMIN: 3.6 g/dL (ref 3.5–5.0)
ALT: 27 U/L (ref 17–63)
AST: 29 U/L (ref 15–41)
Alkaline Phosphatase: 38 U/L (ref 38–126)
Anion gap: 12 (ref 5–15)
BUN: 30 mg/dL — AB (ref 6–20)
CHLORIDE: 104 mmol/L (ref 101–111)
CO2: 23 mmol/L (ref 22–32)
CREATININE: 1.56 mg/dL — AB (ref 0.61–1.24)
Calcium: 9.4 mg/dL (ref 8.9–10.3)
GFR calc Af Amer: 45 mL/min — ABNORMAL LOW (ref >60)
GFR, EST NON AFRICAN AMERICAN: 39 mL/min — AB (ref >60)
GLUCOSE: 120 mg/dL — AB (ref 65–99)
Potassium: 4.2 mmol/L (ref 3.5–5.1)
SODIUM: 139 mmol/L (ref 135–145)
Total Bilirubin: 0.6 mg/dL (ref 0.3–1.2)
Total Protein: 7.6 g/dL (ref 6.5–8.1)

## 2015-08-01 LAB — TYPE AND SCREEN
ABO/RH(D): A POS
Antibody Screen: NEGATIVE

## 2015-08-01 LAB — SURGICAL PCR SCREEN
MRSA, PCR: NEGATIVE
Staphylococcus aureus: NEGATIVE

## 2015-08-01 LAB — URINALYSIS, ROUTINE W REFLEX MICROSCOPIC
Bilirubin Urine: NEGATIVE
Glucose, UA: NEGATIVE mg/dL
Hgb urine dipstick: NEGATIVE
KETONES UR: NEGATIVE mg/dL
LEUKOCYTES UA: NEGATIVE
NITRITE: NEGATIVE
PROTEIN: NEGATIVE mg/dL
Specific Gravity, Urine: 1.015 (ref 1.005–1.030)
pH: 6 (ref 5.0–8.0)

## 2015-08-01 LAB — APTT: aPTT: 29 seconds (ref 24–37)

## 2015-08-01 NOTE — Pre-Procedure Instructions (Signed)
Ian Humphrey Jr.  08/01/2015      [id number] [address] [place] [address] Phone: [phone] Fax: [phone]    Your procedure is scheduled on April 7  Report to [place] at 815 A.M.  Call this number if you have problems the morning of surgery:  [phone]   Remember:  Do not eat food or drink liquids after midnight.  Take these medicines the morning of surgery with A SIP OF WATER allopurinol (Zyloprim), amlodipine (norvasc), atenolol (Tenormin), lansoprazole (Prevacid ) if needed, paroxetine (Paxil), Levothyroxine (Synthroid)  Stop taking aspirin, BC's, Goody's, Ibuprofen, Advil, Motrin, Vitamins, Fish Oil, Herbal medications Stop taking Plavix as directed by your Dr.   Lazaro Arms not wear jewelry, make-up or nail polish.  Do not wear lotions, powders, or perfumes.  You may wear deodorant.  Do not shave 48 hours prior to surgery.  Men may shave face and neck.  Do not bring valuables to the hospital.  [facility] is not responsible for any belongings or valuables.  Contacts, dentures or bridgework may not be worn into surgery.  Leave your suitcase in the car.  After surgery it may be brought to your room.  For patients admitted to the hospital, discharge time will be determined by your treatment team.  Patients discharged the day of surgery will not be allowed to drive home.    Special instructions:  [facility] - Preparing for Surgery  Before surgery, you can play an important role.  Because skin is not sterile, your skin needs to be as free of germs as possible.  You can reduce the number of germs on you skin by washing with CHG (chlorahexidine gluconate) soap before surgery.  CHG is an antiseptic cleaner which kills germs and bonds with the skin to continue killing germs even after washing.  Please DO NOT use if you have an allergy to CHG or antibacterial soaps.  If your skin becomes  reddened/irritated stop using the CHG and inform your nurse when you arrive at Short Stay.  Do not shave (including legs and underarms) for at least 48 hours prior to the first CHG shower.  You may shave your face.  Please follow these instructions carefully:   1.  Shower with CHG Soap the night before surgery and the    morning of Surgery.  2.  If you choose to wash your hair, wash your hair first as usual with your       normal shampoo.  3.  After you shampoo, rinse your hair and body thoroughly to remove the                      Shampoo.  4.  Use CHG as you would any other liquid soap.  You can apply chg directly       to the skin and wash gently with scrungie or a clean washcloth.  5.  Apply the CHG Soap to your body ONLY FROM THE NECK DOWN.        Do not use on open wounds or open sores.  Avoid contact with your eyes,       ears, mouth and genitals (private parts).  Wash genitals (private parts)       with your normal soap.  6.  Wash thoroughly, paying special attention to the area where your surgery        will be performed.  7.  Thoroughly rinse your body with warm water from the neck down.  8.  DO NOT shower/wash with your normal soap after using and rinsing off       the CHG Soap.  9.  Pat yourself dry with a clean towel.            10.  Wear clean pajamas.            11.  Place clean sheets on your bed the night of your first shower and do not        sleep with pets.  Day of Surgery  Do not apply any lotions/deoderants the morning of surgery.  Please wear clean clothes to the hospital/surgery center.     Please read over the following fact sheets that you were given. Pain Booklet, Coughing and Deep Breathing, Blood Transfusion Information, MRSA Information and Surgical Site Infection Prevention

## 2015-08-01 NOTE — Progress Notes (Signed)
PCP is Dr Brigitte Pulse Dr Wynonia Lawman is his cardiologist. Pt has stopped Plavix as directed by his Dr Cristi Loron 07-30-15 Echo noted 10-14-12 Card cath noted in care everywhere 08-06-12

## 2015-08-02 MED ORDER — SODIUM CHLORIDE 0.9 % IV SOLN: INTRAVENOUS | Status: DC

## 2015-08-02 MED ORDER — DEXTROSE 5 % IV SOLN
1.5000 g | INTRAVENOUS | Status: AC
  Administered 2015-08-03: 1.5 g via INTRAVENOUS
  Filled 2015-08-02: qty 1.5

## 2015-08-02 NOTE — Progress Notes (Signed)
Anesthesia Chart Review: Patient is a 82 year old male scheduled for right femoral endarterectomy, right FPBG on 08/03/15 by Dr. Trula Slade.  History includes MI, CAD s/p CABG '95 (LIMA to LAD, SVG-OM1-OM2, SVG-PD-PL, SVG-DX) s/p subsequent stents and redo CABG 10/18/12 (SVG-PD), angina, PVD, peripheral neuropathy, remote former smoker, HLD, HTN, GERD, hiatal hernia, carotid artery stenosis s/p right carotid stent '13 (Dr. Harvel Ricks, [facility]), CVA/TIA, BPH, anxiety, depression, hypothyroidism, gout. Abdominal aorta measured 3.1 cm by 2008 U/S but only 2.7 cm by 2009 U/S (aortograms in 2016 and 06/27/15 did not make note of any finding of AAA). PCP is listed as Dr. Marton Redwood.   Cardiologist is Dr. Wynonia Lawman with pre-operative evaluation on 07/31/15. He writes: "1. From a cardiovascular viewpoint may proceed with the planned vascular surgery. His cardiac status is stable and he has been revascularized in the past 3 years. He does have incomplete revascularization with a known occluded marginal branch that may cause some angina.Marland KitchenMarland KitchenI would be happy to see in the hospital at the time of surgery should the need arise. He should continue his cardiac medications during the time of surgery. EKG shows sinus rhythm with no ischemic changes."  Meds include allopurinol, amlodipine, ASA, atenolol, Plavix (on hold), fish oil, levothyroxine, HCTZ, lisinopril, Rena-vit, Paxil, Crestor.   07/31/15 EKG (Dr. Wynonia Lawman): SB at 46 bpm. Horizontal axis.  02/02/13 LHC (Dr. Wynonia Lawman): ANGIOGRAPHIC DATA:  CORONARY ARTERIES: Arise and distribute normally. Right dominant. Moderate coronary calcification is noted. - Left main coronary artery: Normal. - Left anterior descending: Occluded in the midportion after giving off a septal perforator and 2 diagonal branches. A retrograde stump of a bypass graft is noted coming off of the diagonal. - Circumflex coronary artery: Occluded proximally - Right coronary artery: Subtotally occluded in the  midportion fills by bridging collaterals. A small posterior descending is seen to fills both antegrade as well as by collaterals through the mammary artery - SVG to RCA: Is widely patent with a patent proximal and distal anastomotic site. The distal right coronary artery appears to have a stenosis at the origin of the posterior lateral branch estimated at 90%. - SVG to diag: Occluded - SVG to OM: Widely patent. The proximal stent is patent followed by an area of 30-40% stenosis within the native vein graft, 2 distal stents are patent - LIMA to LAD: Widely patent with a patent distal anastomotic site. Collateral filling is seen of a distal posterior descending artery LEFT VENTRICULOGRAM: Performed in the 30 RAO projection. The aortic and mitral valves are normal. The left ventricle is normal in size with normal wall motion. Estimated ejection fraction is 50%. IMPRESSIONS: 1. Severe native three-vessel coronary artery disease 2. Widely patent vein graft to the right coronary artery, widely patent internal mammary graft to the LAD, and widely patent saphenous and graft to the marginal branch with occlusion of the vein graft to the diagonal 3. Normal left ventricle. RECOMMENDATION: Continue medical therapy at this time. The chest pain may be noncardiac although he does have potential sites of ischemia in the distal posterior descending artery and some very faint collaterals to a second marginal branch are noted. He should be managed medically and may be reassured at this time.  10/14/12 Echo: Study Conclusions - Left ventricle: The cavity size was normal. There was mild concentric hypertrophy. Systolic function was normal. The estimated ejection fraction was in the range of 50% to 55%. Wall motion was normal; there were no regional wall motion abnormalities. - Mitral valve: Mild  to moderate regurgitation. - Left atrium: The atrium was severely dilated. - Pulmonary arteries: Systolic  pressure was mildly to moderately increased. PA peak pressure: 44mm Hg (S).  02/05/15 Carotid U/S: Impression: 1. Patient right stent with no evidence of stenosis. 2. Bilateral ECA stenosis. 3. 123456 LICA stenosis, higher end of scale.   12/19/14 CXR: IMPRESSION: No acute cardiopulmonary disease.  09/09/12 Spirometry: FVC 3.30 (86%), FEV1 2.34 (87%). Mild airflow limitation. No significant response to BD.  Preoperative labs noted. Cr 1.56, BUN 30 (range since 11/2014: BUN 27-36, Cr 1.10-1.40).  H/H 12.5/37.6. PT/PTT WNL. T&S done. Dr. Trula Slade can follow renal function post-operatively.   If no acute changes then I anticipate that he can proceed as planned.  George Hugh [facility] Short Stay Center/Anesthesiology Phone [phone] 08/02/2015 11:28 AM

## 2015-08-03 ENCOUNTER — Encounter (HOSPITAL_COMMUNITY)
Admission: RE | Disposition: A | Discharge status: Home Health | Payer: Self-pay | Source: Ambulatory Visit | Attending: Surgery

## 2015-08-03 ENCOUNTER — Inpatient Hospital Stay (HOSPITAL_COMMUNITY)
Admission: RE | Admit: 2015-08-03 | Discharge: 2015-08-06 | DRG: 271 | Disposition: A | Discharge status: Home Health | Payer: Commercial Managed Care - HMO | Source: Ambulatory Visit | Attending: Surgery | Admitting: Surgery

## 2015-08-03 ENCOUNTER — Inpatient Hospital Stay (HOSPITAL_COMMUNITY): Payer: Commercial Managed Care - HMO

## 2015-08-03 ENCOUNTER — Inpatient Hospital Stay (HOSPITAL_COMMUNITY): Payer: Commercial Managed Care - HMO | Admitting: Anesthesiology

## 2015-08-03 ENCOUNTER — Encounter (HOSPITAL_COMMUNITY): Payer: Self-pay | Admitting: Anesthesiology

## 2015-08-03 ENCOUNTER — Inpatient Hospital Stay (HOSPITAL_COMMUNITY): Payer: Commercial Managed Care - HMO | Admitting: Vascular Surgery

## 2015-08-03 DIAGNOSIS — Z419 Encounter for procedure for purposes other than remedying health state, unspecified: Secondary | ICD-10-CM

## 2015-08-03 DIAGNOSIS — Z87891 Personal history of nicotine dependence: Secondary | ICD-10-CM

## 2015-08-03 DIAGNOSIS — I739 Peripheral vascular disease, unspecified: Secondary | ICD-10-CM

## 2015-08-03 DIAGNOSIS — M795 Residual foreign body in soft tissue: Secondary | ICD-10-CM | POA: Diagnosis not present

## 2015-08-03 DIAGNOSIS — Z7982 Long term (current) use of aspirin: Secondary | ICD-10-CM | POA: Diagnosis not present

## 2015-08-03 DIAGNOSIS — I2510 Atherosclerotic heart disease of native coronary artery without angina pectoris: Secondary | ICD-10-CM | POA: Diagnosis present

## 2015-08-03 DIAGNOSIS — E785 Hyperlipidemia, unspecified: Secondary | ICD-10-CM | POA: Diagnosis present

## 2015-08-03 DIAGNOSIS — Z888 Allergy status to other drugs, medicaments and biological substances status: Secondary | ICD-10-CM

## 2015-08-03 DIAGNOSIS — Z6822 Body mass index (BMI) 22.0-22.9, adult: Secondary | ICD-10-CM | POA: Diagnosis not present

## 2015-08-03 DIAGNOSIS — Z955 Presence of coronary angioplasty implant and graft: Secondary | ICD-10-CM

## 2015-08-03 DIAGNOSIS — E039 Hypothyroidism, unspecified: Secondary | ICD-10-CM | POA: Diagnosis present

## 2015-08-03 DIAGNOSIS — E440 Moderate protein-calorie malnutrition: Secondary | ICD-10-CM | POA: Diagnosis present

## 2015-08-03 DIAGNOSIS — I10 Essential (primary) hypertension: Secondary | ICD-10-CM | POA: Diagnosis present

## 2015-08-03 DIAGNOSIS — Z7902 Long term (current) use of antithrombotics/antiplatelets: Secondary | ICD-10-CM | POA: Diagnosis not present

## 2015-08-03 DIAGNOSIS — G629 Polyneuropathy, unspecified: Secondary | ICD-10-CM | POA: Diagnosis present

## 2015-08-03 DIAGNOSIS — Z79899 Other long term (current) drug therapy: Secondary | ICD-10-CM

## 2015-08-03 DIAGNOSIS — I252 Old myocardial infarction: Secondary | ICD-10-CM

## 2015-08-03 DIAGNOSIS — L97419 Non-pressure chronic ulcer of right heel and midfoot with unspecified severity: Secondary | ICD-10-CM | POA: Diagnosis present

## 2015-08-03 DIAGNOSIS — Z8249 Family history of ischemic heart disease and other diseases of the circulatory system: Secondary | ICD-10-CM

## 2015-08-03 DIAGNOSIS — I70201 Unspecified atherosclerosis of native arteries of extremities, right leg: Principal | ICD-10-CM | POA: Diagnosis present

## 2015-08-03 DIAGNOSIS — Z823 Family history of stroke: Secondary | ICD-10-CM

## 2015-08-03 DIAGNOSIS — Z951 Presence of aortocoronary bypass graft: Secondary | ICD-10-CM | POA: Diagnosis not present

## 2015-08-03 DIAGNOSIS — L97919 Non-pressure chronic ulcer of unspecified part of right lower leg with unspecified severity: Secondary | ICD-10-CM | POA: Diagnosis present

## 2015-08-03 DIAGNOSIS — F419 Anxiety disorder, unspecified: Secondary | ICD-10-CM | POA: Diagnosis present

## 2015-08-03 DIAGNOSIS — L97409 Non-pressure chronic ulcer of unspecified heel and midfoot with unspecified severity: Secondary | ICD-10-CM | POA: Diagnosis not present

## 2015-08-03 DIAGNOSIS — I771 Stricture of artery: Secondary | ICD-10-CM | POA: Diagnosis not present

## 2015-08-03 DIAGNOSIS — Z8673 Personal history of transient ischemic attack (TIA), and cerebral infarction without residual deficits: Secondary | ICD-10-CM | POA: Diagnosis not present

## 2015-08-03 DIAGNOSIS — K219 Gastro-esophageal reflux disease without esophagitis: Secondary | ICD-10-CM | POA: Diagnosis present

## 2015-08-03 HISTORY — DX: Peripheral vascular disease, unspecified: I73.9

## 2015-08-03 HISTORY — PX: FEMORAL-POPLITEAL BYPASS GRAFT: SHX937

## 2015-08-03 HISTORY — PX: ENDARTERECTOMY FEMORAL: SHX5804

## 2015-08-03 SURGERY — ENDARTERECTOMY, FEMORAL
Anesthesia: General | Site: Leg Upper | Laterality: Right

## 2015-08-03 MED ORDER — PROTAMINE SULFATE 10 MG/ML IV SOLN
INTRAVENOUS | Status: AC
  Filled 2015-08-03: qty 5

## 2015-08-03 MED ORDER — PAPAVERINE HCL 30 MG/ML IJ SOLN
INTRAMUSCULAR | Status: AC
  Filled 2015-08-03: qty 2

## 2015-08-03 MED ORDER — PAROXETINE HCL 20 MG PO TABS
20.0000 mg | ORAL_TABLET | ORAL | Status: DC
  Administered 2015-08-04 – 2015-08-06 (×3): 20 mg via ORAL
  Filled 2015-08-03 (×3): qty 1

## 2015-08-03 MED ORDER — ROCURONIUM BROMIDE 100 MG/10ML IV SOLN
INTRAVENOUS | Status: DC | PRN
  Administered 2015-08-03: 10 mg via INTRAVENOUS
  Administered 2015-08-03: 20 mg via INTRAVENOUS
  Administered 2015-08-03: 40 mg via INTRAVENOUS

## 2015-08-03 MED ORDER — LEVOTHYROXINE SODIUM 88 MCG PO TABS
88.0000 ug | ORAL_TABLET | Freq: Every day | ORAL | Status: DC
  Administered 2015-08-04 – 2015-08-06 (×3): 88 ug via ORAL
  Filled 2015-08-03 (×3): qty 1

## 2015-08-03 MED ORDER — SUGAMMADEX SODIUM 200 MG/2ML IV SOLN
INTRAVENOUS | Status: AC
  Filled 2015-08-03: qty 2

## 2015-08-03 MED ORDER — PHENOL 1.4 % MT LIQD: 1.0000 | OROMUCOSAL | Status: DC | PRN

## 2015-08-03 MED ORDER — AMLODIPINE BESYLATE 10 MG PO TABS
10.0000 mg | ORAL_TABLET | Freq: Every day | ORAL | Status: DC
  Administered 2015-08-04 – 2015-08-06 (×3): 10 mg via ORAL
  Filled 2015-08-03 (×4): qty 1

## 2015-08-03 MED ORDER — PANTOPRAZOLE SODIUM 40 MG PO TBEC
40.0000 mg | DELAYED_RELEASE_TABLET | Freq: Every day | ORAL | Status: DC
  Administered 2015-08-03 – 2015-08-06 (×4): 40 mg via ORAL
  Filled 2015-08-03 (×4): qty 1

## 2015-08-03 MED ORDER — FENTANYL CITRATE (PF) 100 MCG/2ML IJ SOLN
INTRAMUSCULAR | Status: DC | PRN
  Administered 2015-08-03: 50 ug via INTRAVENOUS
  Administered 2015-08-03: 100 ug via INTRAVENOUS

## 2015-08-03 MED ORDER — OXYCODONE HCL 5 MG PO TABS
5.0000 mg | ORAL_TABLET | ORAL | Status: DC | PRN
  Administered 2015-08-03 – 2015-08-05 (×5): 5 mg via ORAL
  Filled 2015-08-03 (×5): qty 1

## 2015-08-03 MED ORDER — ATENOLOL 25 MG PO TABS
50.0000 mg | ORAL_TABLET | Freq: Every day | ORAL | Status: DC
  Administered 2015-08-04 – 2015-08-06 (×3): 50 mg via ORAL
  Filled 2015-08-03: qty 1
  Filled 2015-08-03 (×2): qty 2

## 2015-08-03 MED ORDER — PROTAMINE SULFATE 10 MG/ML IV SOLN
INTRAVENOUS | Status: DC | PRN
Start: 2015-08-03 — End: 2015-08-03
  Administered 2015-08-03: 25 mg via INTRAVENOUS
  Administered 2015-08-03: 50 mg via INTRAVENOUS

## 2015-08-03 MED ORDER — BISACODYL 10 MG RE SUPP: 10.0000 mg | Freq: Every day | RECTAL | Status: DC | PRN

## 2015-08-03 MED ORDER — PROPOFOL 10 MG/ML IV BOLUS
INTRAVENOUS | Status: DC | PRN
  Administered 2015-08-03: 140 mg via INTRAVENOUS

## 2015-08-03 MED ORDER — SODIUM CHLORIDE 0.9 % IV SOLN: 500.0000 mL | Freq: Once | INTRAVENOUS | Status: DC | PRN

## 2015-08-03 MED ORDER — CHLORHEXIDINE GLUCONATE CLOTH 2 % EX PADS: 6.0000 | MEDICATED_PAD | Freq: Once | CUTANEOUS | Status: DC

## 2015-08-03 MED ORDER — MORPHINE SULFATE (PF) 2 MG/ML IV SOLN
2.0000 mg | INTRAVENOUS | Status: DC | PRN
  Administered 2015-08-03 – 2015-08-05 (×8): 2 mg via INTRAVENOUS
  Filled 2015-08-03 (×8): qty 1

## 2015-08-03 MED ORDER — ARTIFICIAL TEARS OP OINT
TOPICAL_OINTMENT | OPHTHALMIC | Status: DC | PRN
  Administered 2015-08-03: 1 via OPHTHALMIC

## 2015-08-03 MED ORDER — ROSUVASTATIN CALCIUM 10 MG PO TABS
40.0000 mg | ORAL_TABLET | Freq: Every day | ORAL | Status: DC
  Administered 2015-08-03 – 2015-08-06 (×4): 40 mg via ORAL
  Filled 2015-08-03: qty 1
  Filled 2015-08-03 (×2): qty 4

## 2015-08-03 MED ORDER — SUCCINYLCHOLINE CHLORIDE 20 MG/ML IJ SOLN
INTRAMUSCULAR | Status: DC | PRN
  Administered 2015-08-03: 120 mg via INTRAVENOUS

## 2015-08-03 MED ORDER — HYDROCHLOROTHIAZIDE 25 MG PO TABS
25.0000 mg | ORAL_TABLET | Freq: Every day | ORAL | Status: DC
  Administered 2015-08-03 – 2015-08-06 (×4): 25 mg via ORAL
  Filled 2015-08-03 (×4): qty 1

## 2015-08-03 MED ORDER — FENTANYL CITRATE (PF) 250 MCG/5ML IJ SOLN
INTRAMUSCULAR | Status: AC
  Filled 2015-08-03: qty 5

## 2015-08-03 MED ORDER — HEPARIN SODIUM (PORCINE) 1000 UNIT/ML IJ SOLN
INTRAMUSCULAR | Status: AC
  Filled 2015-08-03: qty 1

## 2015-08-03 MED ORDER — ONDANSETRON HCL 4 MG/2ML IJ SOLN: 4.0000 mg | Freq: Four times a day (QID) | INTRAMUSCULAR | Status: DC | PRN

## 2015-08-03 MED ORDER — HEPARIN SODIUM (PORCINE) 1000 UNIT/ML IJ SOLN
INTRAMUSCULAR | Status: DC | PRN
  Administered 2015-08-03: 2000 [IU] via INTRAVENOUS
  Administered 2015-08-03: 7000 [IU] via INTRAVENOUS

## 2015-08-03 MED ORDER — SUGAMMADEX SODIUM 200 MG/2ML IV SOLN
INTRAVENOUS | Status: DC | PRN
  Administered 2015-08-03: 200 mg via INTRAVENOUS

## 2015-08-03 MED ORDER — HYDRALAZINE HCL 20 MG/ML IJ SOLN
INTRAMUSCULAR | Status: AC
  Filled 2015-08-03: qty 1

## 2015-08-03 MED ORDER — GUAIFENESIN-DM 100-10 MG/5ML PO SYRP: 15.0000 mL | ORAL_SOLUTION | ORAL | Status: DC | PRN

## 2015-08-03 MED ORDER — PHENYLEPHRINE HCL 10 MG/ML IJ SOLN
10.0000 mg | INTRAVENOUS | Status: DC | PRN
  Administered 2015-08-03: 10 ug/min via INTRAVENOUS

## 2015-08-03 MED ORDER — PHENYLEPHRINE 40 MCG/ML (10ML) SYRINGE FOR IV PUSH (FOR BLOOD PRESSURE SUPPORT)
PREFILLED_SYRINGE | INTRAVENOUS | Status: AC
  Filled 2015-08-03: qty 10

## 2015-08-03 MED ORDER — ONDANSETRON HCL 4 MG/2ML IJ SOLN
INTRAMUSCULAR | Status: DC | PRN
  Administered 2015-08-03: 4 mg via INTRAVENOUS

## 2015-08-03 MED ORDER — SODIUM CHLORIDE 0.9 % IV SOLN
INTRAVENOUS | Status: DC | PRN
  Administered 2015-08-03: 500 mL

## 2015-08-03 MED ORDER — EPHEDRINE SULFATE 50 MG/ML IJ SOLN
INTRAMUSCULAR | Status: DC | PRN
  Administered 2015-08-03 (×5): 10 mg via INTRAVENOUS

## 2015-08-03 MED ORDER — ALUM & MAG HYDROXIDE-SIMETH 200-200-20 MG/5ML PO SUSP
15.0000 mL | ORAL | Status: DC | PRN
  Administered 2015-08-03 (×2): 30 mL via ORAL
  Filled 2015-08-03 (×2): qty 30

## 2015-08-03 MED ORDER — SENNOSIDES-DOCUSATE SODIUM 8.6-50 MG PO TABS: 1.0000 | ORAL_TABLET | Freq: Every evening | ORAL | Status: DC | PRN

## 2015-08-03 MED ORDER — OMEGA-3-ACID ETHYL ESTERS 1 G PO CAPS
1.0000 g | ORAL_CAPSULE | Freq: Every day | ORAL | Status: DC
  Administered 2015-08-03 – 2015-08-06 (×4): 1 g via ORAL
  Filled 2015-08-03 (×4): qty 1

## 2015-08-03 MED ORDER — IOPAMIDOL (ISOVUE-300) INJECTION 61%
INTRAVENOUS | Status: AC
  Filled 2015-08-03: qty 50

## 2015-08-03 MED ORDER — SODIUM CHLORIDE 0.9 % IV SOLN
INTRAVENOUS | Status: DC
  Administered 2015-08-03: 20:00:00 via INTRAVENOUS

## 2015-08-03 MED ORDER — HYDRALAZINE HCL 20 MG/ML IJ SOLN: 5.0000 mg | INTRAMUSCULAR | Status: DC | PRN

## 2015-08-03 MED ORDER — ALBUMIN HUMAN 5 % IV SOLN
INTRAVENOUS | Status: DC | PRN
  Administered 2015-08-03: 12:00:00 via INTRAVENOUS

## 2015-08-03 MED ORDER — DOCUSATE SODIUM 100 MG PO CAPS
100.0000 mg | ORAL_CAPSULE | Freq: Every day | ORAL | Status: DC
  Administered 2015-08-05 – 2015-08-06 (×2): 100 mg via ORAL
  Filled 2015-08-03 (×2): qty 1

## 2015-08-03 MED ORDER — VITAMIN B-12 1000 MCG PO TABS
1000.0000 ug | ORAL_TABLET | Freq: Every day | ORAL | Status: DC
  Administered 2015-08-03 – 2015-08-06 (×4): 1000 ug via ORAL
  Filled 2015-08-03 (×4): qty 1

## 2015-08-03 MED ORDER — DEXTROSE 5 % IV SOLN
1.5000 g | Freq: Two times a day (BID) | INTRAVENOUS | Status: AC
  Administered 2015-08-03 – 2015-08-04 (×2): 1.5 g via INTRAVENOUS
  Filled 2015-08-03 (×2): qty 1.5

## 2015-08-03 MED ORDER — ONDANSETRON HCL 4 MG/2ML IJ SOLN
INTRAMUSCULAR | Status: AC
  Filled 2015-08-03: qty 2

## 2015-08-03 MED ORDER — SODIUM CHLORIDE 0.9 % IJ SOLN
INTRAMUSCULAR | Status: AC
  Filled 2015-08-03: qty 10

## 2015-08-03 MED ORDER — ACETAMINOPHEN 325 MG PO TABS
325.0000 mg | ORAL_TABLET | ORAL | Status: DC | PRN
  Administered 2015-08-05: 650 mg via ORAL
  Filled 2015-08-03: qty 2

## 2015-08-03 MED ORDER — ROCURONIUM BROMIDE 50 MG/5ML IV SOLN
INTRAVENOUS | Status: AC
  Filled 2015-08-03: qty 1

## 2015-08-03 MED ORDER — ESMOLOL HCL 100 MG/10ML IV SOLN
INTRAVENOUS | Status: AC
  Filled 2015-08-03: qty 10

## 2015-08-03 MED ORDER — HEMOSTATIC AGENTS (NO CHARGE) OPTIME
TOPICAL | Status: DC | PRN
  Administered 2015-08-03 (×3): 1 via TOPICAL

## 2015-08-03 MED ORDER — LACTATED RINGERS IV SOLN
INTRAVENOUS | Status: DC
  Administered 2015-08-03 (×2): via INTRAVENOUS

## 2015-08-03 MED ORDER — VITAMIN D 1000 UNITS PO TABS
1000.0000 [IU] | ORAL_TABLET | Freq: Every day | ORAL | Status: DC
  Administered 2015-08-03 – 2015-08-06 (×4): 1000 [IU] via ORAL
  Filled 2015-08-03 (×4): qty 1

## 2015-08-03 MED ORDER — OMEGA-3 FATTY ACIDS 1000 MG PO CAPS: 1.0000 g | ORAL_CAPSULE | Freq: Every day | ORAL | Status: DC

## 2015-08-03 MED ORDER — 0.9 % SODIUM CHLORIDE (POUR BTL) OPTIME
TOPICAL | Status: DC | PRN
  Administered 2015-08-03 (×3): 1000 mL

## 2015-08-03 MED ORDER — LIDOCAINE HCL (CARDIAC) 20 MG/ML IV SOLN
INTRAVENOUS | Status: DC | PRN
  Administered 2015-08-03: 100 mg via INTRAVENOUS

## 2015-08-03 MED ORDER — EPHEDRINE SULFATE 50 MG/ML IJ SOLN
INTRAMUSCULAR | Status: AC
  Filled 2015-08-03: qty 1

## 2015-08-03 MED ORDER — ACETAMINOPHEN 650 MG RE SUPP: 325.0000 mg | RECTAL | Status: DC | PRN

## 2015-08-03 MED ORDER — RENA-VITE PO TABS
1.0000 | ORAL_TABLET | Freq: Every day | ORAL | Status: DC
  Administered 2015-08-04 – 2015-08-06 (×3): 1 via ORAL
  Filled 2015-08-03 (×3): qty 1

## 2015-08-03 SURGICAL SUPPLY — 76 items
BANDAGE ELASTIC 4 VELCRO ST LF (GAUZE/BANDAGES/DRESSINGS) IMPLANT
BANDAGE ESMARK 6X9 LF (GAUZE/BANDAGES/DRESSINGS) IMPLANT
BNDG CMPR 9X6 STRL LF SNTH (GAUZE/BANDAGES/DRESSINGS)
BNDG ESMARK 6X9 LF (GAUZE/BANDAGES/DRESSINGS)
CANISTER SUCTION 2500CC (MISCELLANEOUS) ×3 IMPLANT
CANNULA VESSEL 3MM 2 BLNT TIP (CANNULA) ×1 IMPLANT
CATH EMB 5FR 80CM (CATHETERS) ×1 IMPLANT
CLIP TI MEDIUM 24 (CLIP) ×3 IMPLANT
CLIP TI WIDE RED SMALL 24 (CLIP) ×3 IMPLANT
CUFF TOURNIQUET SINGLE 24IN (TOURNIQUET CUFF) IMPLANT
CUFF TOURNIQUET SINGLE 34IN LL (TOURNIQUET CUFF) IMPLANT
CUFF TOURNIQUET SINGLE 44IN (TOURNIQUET CUFF) IMPLANT
DRAIN CHANNEL 15F RND FF W/TCR (WOUND CARE) ×1 IMPLANT
DRAPE PROXIMA HALF (DRAPES) ×2 IMPLANT
DRAPE X-RAY CASS 24X20 (DRAPES) IMPLANT
DRSG COVADERM 4X10 (GAUZE/BANDAGES/DRESSINGS) IMPLANT
DRSG COVADERM 4X8 (GAUZE/BANDAGES/DRESSINGS) IMPLANT
ELECT REM PT RETURN 9FT ADLT (ELECTROSURGICAL) ×3
ELECTRODE REM PT RTRN 9FT ADLT (ELECTROSURGICAL) ×2 IMPLANT
EVACUATOR SILICONE 100CC (DRAIN) ×1 IMPLANT
GLOVE BIO SURGEON STRL SZ 6.5 (GLOVE) ×2 IMPLANT
GLOVE BIOGEL PI IND STRL 6.5 (GLOVE) IMPLANT
GLOVE BIOGEL PI IND STRL 7.0 (GLOVE) IMPLANT
GLOVE BIOGEL PI IND STRL 7.5 (GLOVE) ×2 IMPLANT
GLOVE BIOGEL PI INDICATOR 6.5 (GLOVE) ×3
GLOVE BIOGEL PI INDICATOR 7.0 (GLOVE) ×3
GLOVE BIOGEL PI INDICATOR 7.5 (GLOVE) ×1
GLOVE ECLIPSE 6.5 STRL STRAW (GLOVE) ×1 IMPLANT
GLOVE SURG SS PI 6.5 STRL IVOR (GLOVE) ×2 IMPLANT
GLOVE SURG SS PI 7.0 STRL IVOR (GLOVE) ×1 IMPLANT
GLOVE SURG SS PI 7.5 STRL IVOR (GLOVE) ×3 IMPLANT
GOWN STRL REUS W/ TWL LRG LVL3 (GOWN DISPOSABLE) ×4 IMPLANT
GOWN STRL REUS W/ TWL XL LVL3 (GOWN DISPOSABLE) ×2 IMPLANT
GOWN STRL REUS W/TWL LRG LVL3 (GOWN DISPOSABLE) ×15
GOWN STRL REUS W/TWL XL LVL3 (GOWN DISPOSABLE) ×3
GRAFT PROPATEN W/RING 6X80X60 (Vascular Products) ×1 IMPLANT
HEMOSTAT POWDER SURGIFOAM 1G (HEMOSTASIS) ×1 IMPLANT
HEMOSTAT SNOW SURGICEL 2X4 (HEMOSTASIS) ×2 IMPLANT
KIT BASIN OR (CUSTOM PROCEDURE TRAY) ×3 IMPLANT
KIT ROOM TURNOVER OR (KITS) ×3 IMPLANT
LIQUID BAND (GAUZE/BANDAGES/DRESSINGS) ×5 IMPLANT
LOOP VESSEL MINI RED (MISCELLANEOUS) ×1 IMPLANT
MARKER GRAFT CORONARY BYPASS (MISCELLANEOUS) IMPLANT
NS IRRIG 1000ML POUR BTL (IV SOLUTION) ×7 IMPLANT
PACK PERIPHERAL VASCULAR (CUSTOM PROCEDURE TRAY) ×3 IMPLANT
PAD ARMBOARD 7.5X6 YLW CONV (MISCELLANEOUS) ×6 IMPLANT
PADDING CAST COTTON 6X4 STRL (CAST SUPPLIES) IMPLANT
PATCH VASC XENOSURE 1CMX6CM (Vascular Products) ×3 IMPLANT
PATCH VASC XENOSURE 1X6 (Vascular Products) IMPLANT
SET COLLECT BLD 21X3/4 12 (NEEDLE) IMPLANT
SPONGE INTESTINAL PEANUT (DISPOSABLE) ×3 IMPLANT
SPONGE LAP 18X18 X RAY DECT (DISPOSABLE) ×2 IMPLANT
STOPCOCK 4 WAY LG BORE MALE ST (IV SETS) ×1 IMPLANT
SUT ETHILON 3 0 PS 1 (SUTURE) ×1 IMPLANT
SUT GORETEX 6.0 TT13 (SUTURE) ×2 IMPLANT
SUT GORETEX 6.0 TT9 (SUTURE) ×2 IMPLANT
SUT PROLENE 5 0 C 1 24 (SUTURE) ×6 IMPLANT
SUT PROLENE 6 0 BV (SUTURE) ×5 IMPLANT
SUT PROLENE 7 0 BV 1 (SUTURE) IMPLANT
SUT PROLENE 7 0 BV1 MDA (SUTURE) ×1 IMPLANT
SUT SILK 2 0 (SUTURE) ×3
SUT SILK 2 0 SH (SUTURE) ×3 IMPLANT
SUT SILK 2-0 18XBRD TIE 12 (SUTURE) IMPLANT
SUT SILK 3 0 (SUTURE) ×3
SUT SILK 3-0 18XBRD TIE 12 (SUTURE) IMPLANT
SUT VIC AB 2-0 CT1 27 (SUTURE) ×6
SUT VIC AB 2-0 CT1 TAPERPNT 27 (SUTURE) ×4 IMPLANT
SUT VIC AB 3-0 SH 27 (SUTURE) ×6
SUT VIC AB 3-0 SH 27X BRD (SUTURE) ×4 IMPLANT
SUT VICRYL 4-0 PS2 18IN ABS (SUTURE) ×6 IMPLANT
SYRINGE 3CC LL L/F (MISCELLANEOUS) ×1 IMPLANT
TAPE UMBILICAL COTTON 1/8X30 (MISCELLANEOUS) IMPLANT
TRAY FOLEY W/METER SILVER 16FR (SET/KITS/TRAYS/PACK) ×3 IMPLANT
TUBING EXTENTION W/L.L. (IV SETS) IMPLANT
UNDERPAD 30X30 INCONTINENT (UNDERPADS AND DIAPERS) ×3 IMPLANT
WATER STERILE IRR 1000ML POUR (IV SOLUTION) ×3 IMPLANT

## 2015-08-03 NOTE — Transfer of Care (Signed)
Immediate Anesthesia Transfer of Care Note  Patient: Ian Cummings  Procedure(s) Performed: Procedure(s): RIGHT FEMORAL ENDARTERECTOMY WITH PATCH ANGIOPLASTY (Right) RIGHT FEMORAL-BELOW KNEE POPLITEAL ARTERY BYPASS GRAFT (Right)  Patient Location: PACU  Anesthesia Type:General  Level of Consciousness: awake, oriented, sedated, patient cooperative and responds to stimulation  Airway & Oxygen Therapy: Patient Spontanous Breathing and Patient connected to nasal cannula oxygen  Post-op Assessment: Report given to RN, Post -op Vital signs reviewed and stable, Patient moving all extremities and Patient moving all extremities X 4  Post vital signs: Reviewed and stable  Last Vitals:  Filed Vitals:   08/03/15 0909  BP: 138/86  Pulse: 54  Temp: 36.8 C  Resp: 18    Complications: No apparent anesthesia complications

## 2015-08-03 NOTE — H&P (View-Only) (Signed)
Patient name: Ian Cummings. MRN: [id number] DOB: 1929-04-11 Sex: male     Chief Complaint  Patient presents with  . Re-evaluation    2 wk f/u to discuss fem endarterctomy- L heel ulcer    HISTORY OF PRESENT ILLNESS: The patient is back for further discussions regarding his right leg.  Unfortunately in the interim since I last saw him, he has developed a 2 week history of a right heel ulcer which is extremely painful.  At his last arteriogram, he was found to have progression of the right common femoral stenosis as well as an occluded right superficial femoral artery with reconstitution of the below knee popliteal artery and single vessel runoff via the anterior tibial artery.  The patient has a history of carotid stenting at [facility] in 2013.  He has significant coronary artery history, status post CABG 2.  He is medically managed for hypercholesterolemia with a statin.  His blood pressure has been well controlled.  He is a former smoker.  Past Medical History  Diagnosis Date  . CAD (coronary artery disease)   . Hyperlipidemia   . Hypertension   . GERD (gastroesophageal reflux disease)   . MI (myocardial infarction) ([place]) 4/14  . Gout   . BPH (benign prostatic hypertrophy)   . Carotid artery disease ([place])     Right carotid stent 2013  . TIA (transient ischemic attack)     Hx: of  . H/O hiatal hernia   . Neuropathy ([facility])     Hx: of in B/L toes  . Stroke ([place])   . AAA (abdominal aortic aneurysm) ([place])     2004, Pt stated not there now  . Peripheral vascular disease ([place])     with claudication  . Anxiety   . Depression   . Pre-diabetes   . Hypothyroidism   . Colon polyps     adenomatous  . Arthritis     Past Surgical History  Procedure Laterality Date  . Coronary artery bypass graft  1995  . Foot surgery Left   . Coronary angioplasty with stent placement  2010  . Inguinal hernia repair Bilateral     X 2   . Carotid stent insertion Right 2013   right at [facility]  . Tonsillectomy    . Colonoscopy      Hx: of  . Retinal detachment surgery Left     Hx: of left eye  . Cataract extraction Bilateral     Hx: of both eyes  . Fracture surgery Left     Hx: of Left heel  . Coronary artery bypass graft N/A 10/18/2012    Procedure: REDO CORONARY ARTERY BYPASS GRAFTING (CABG);  Surgeon: Grace Isaac, MD;  Location: [place];  Service: Open Heart Surgery;  Laterality: N/A;  off pump times two using endoscopically harvested left saphenous vein  . Intraoperative transesophageal echocardiogram N/A 10/18/2012    Procedure: INTRAOPERATIVE TRANSESOPHAGEAL ECHOCARDIOGRAM;  Surgeon: Grace Isaac, MD;  Location: [place];  Service: Open Heart Surgery;  Laterality: N/A;  . Mastoidectomy  1933  . Left heart catheterization with coronary/graft angiogram N/A 02/02/2013    Procedure: LEFT HEART CATHETERIZATION WITH Beatrix Fetters;  Surgeon: Jacolyn Reedy, MD;  Location: [facility] CATH LAB;  Service: Cardiovascular;  Laterality: N/A;  . Peripheral vascular catheterization N/A 01/23/2015    Procedure: Abdominal Aortogram;  Surgeon: Serafina Mitchell, MD;  Location: [place] CV LAB;  Service: Cardiovascular;  Laterality: N/A;  .  Peripheral vascular catheterization N/A 06/27/2015    Procedure: Abdominal Aortogram;  Surgeon: Serafina Mitchell, MD;  Location: [place] CV LAB;  Service: Cardiovascular;  Laterality: N/A;  . Lower extremity angiogram Right 06/27/2015    Procedure: Lower Extremity Angiogram;  Surgeon: Serafina Mitchell, MD;  Location: [place] CV LAB;  Service: Cardiovascular;  Laterality: Right;    Social History   Social History  . Marital Status: Married    Spouse Name: N/A  . Number of Children: 3  . Years of Education: N/A   Occupational History  . retired    Social History Main Topics  . Smoking status: Former Smoker    Types: Cigarettes    Quit date: 04/29/1975  . Smokeless tobacco: Never Used  . Alcohol Use: 10.8 -  12.0 oz/week    12 Glasses of wine, 6-8 Shots of liquor per week     Comment: Occasional drink  . Drug Use: No  . Sexual Activity: Not Currently   Other Topics Concern  . 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 Heart disease Mother     After 43 yrs of age  . Heart attack Mother   . Hyperlipidemia Father   . Hypertension Father   . Heart disease Father     After 23 yrs of age  . Diabetes Father   . Stroke Sister   . Colon cancer Neg Hx   . Stomach cancer Neg Hx   . Rectal cancer Neg Hx   . Esophageal cancer Neg Hx   . Liver disease Neg Hx   . Kidney disease Neg Hx     Allergies as of 07/30/2015 - Review Complete 07/30/2015  Allergen Reaction Noted  . Metoprolol Itching 12/20/2012    Current Outpatient Prescriptions on File Prior to Visit  Medication Sig Dispense Refill  . allopurinol (ZYLOPRIM) 100 MG tablet Take 200 mg by mouth daily.    Marland Kitchen amLODipine (NORVASC) 10 MG tablet Take 10 mg by mouth daily.    Marland Kitchen aspirin EC 81 MG tablet Take 81 mg by mouth daily.    Marland Kitchen atenolol (TENORMIN) 50 MG tablet Take 50 mg by mouth daily.    . cholecalciferol (VITAMIN D) 1000 UNITS tablet Take 2,000 Units by mouth daily.    . clopidogrel (PLAVIX) 75 MG tablet Take 75 mg by mouth daily.    . fish oil-omega-3 fatty acids 1000 MG capsule Take 1 g by mouth daily.     . hydrochlorothiazide (HYDRODIURIL) 25 MG tablet Take 25 mg by mouth daily.    . lansoprazole (PREVACID) 15 MG capsule Take 15 mg by mouth daily as needed.     Marland Kitchen levothyroxine (SYNTHROID, LEVOTHROID) 88 MCG tablet Take 88 mcg by mouth daily before breakfast.    . lisinopril (PRINIVIL,ZESTRIL) 20 MG tablet Take 20 mg by mouth daily.    . multivitamin (RENA-VIT) TABS tablet Take 1 tablet by mouth daily.    Marland Kitchen PARoxetine (PAXIL) 20 MG tablet Take 20 mg by mouth every morning.    . ramipril (ALTACE) 2.5 MG tablet Take 2.5 mg by mouth daily.     .  rosuvastatin (CRESTOR) 40 MG tablet Take 40 mg by mouth daily.    . vitamin B-12 (CYANOCOBALAMIN) 1000 MCG tablet Take 1,000 mcg by mouth daily.     No current facility-administered medications on file prior to visit.  REVIEW OF SYSTEMS: Cardiovascular: No chest pain, chest pressure,  claudication And rest pain,   Pulmonary: No productive cough, asthma or wheezing. Neurologic: No weakness, paresthesias, aphasia, or amaurosis. No dizziness. Hematologic: No bleeding problems or clotting disorders. Musculoskeletal: No joint pain or joint swelling. Gastrointestinal: No blood in stool or hematemesis Genitourinary: No dysuria or hematuria. Psychiatric:: No history of major depression. Integumentary: Right heel ulcer with pain. Constitutional: No fever or chills.  PHYSICAL EXAMINATION:   Vital signs are  Filed Vitals:   07/30/15 1447  BP: 117/56  Pulse: 50  Temp: 97.7 F (36.5 C)  TempSrc: Oral  Height: 5\' 10"  (1.778 m)  Weight: 162 lb (73.483 kg)  SpO2: 97%   Body mass index is 23.24 kg/(m^2). General: The patient appears their stated age. HEENT:  No gross abnormalities Pulmonary:  Non labored breathing Abdomen: Soft and non-tender Musculoskeletal: There are no major deformities. Neurologic: No focal weakness or paresthesias are detected, Skin: "X"Shaped ulcer on the posterior side of the right heel Psychiatric: The patient has normal affect. Cardiovascular: There is a regular rate and rhythm without significant murmur appreciated.   Diagnostic Studies None  Assessment: Right leg ulcer Plan: I discussed 2 options with the patient.  The first would be a right femoral endarterectomy, the second would be a right femoral endarterectomy with a femoral to below-knee popliteal bypass graft with Gore-Tex.  His vein has been removed for CABG.  Because of the ulcer, I feel that in addition to femoral endarterectomy, I need to do a below knee bypass with Gore-Tex.  I discussed the  risks and benefits of the operation including the risk of limb loss and bypass graft failure as well as infection and cardiopulmonary concerns.  He will need to be off of his Plavix beginning today.  I have scheduled his right femoral endarterectomy with patch angioplasty and right femoral to below-knee popliteal artery bypass graft for this Friday, April 7.  I will get cardiology clearance from Dr. Roger Kill. Leia Alf, M.D. [facility] Office: [phone] Pager:  [phone]

## 2015-08-03 NOTE — Anesthesia Preprocedure Evaluation (Signed)
Anesthesia Evaluation  Patient identified by MRN, date of birth, ID band Patient awake    Reviewed: Allergy & Precautions, NPO status , Patient's Chart, lab work & pertinent test results  History of Anesthesia Complications (+) DIFFICULT AIRWAY and history of anesthetic complications  Airway Mallampati: III  TM Distance: >3 FB Neck ROM: Full    Dental  (+) Dental Advisory Given   Pulmonary former smoker,    Pulmonary exam normal        Cardiovascular hypertension, + angina with exertion + CAD and + Past MI  Normal cardiovascular exam     Neuro/Psych PSYCHIATRIC DISORDERS Anxiety Depression TIACVA    GI/Hepatic Neg liver ROS, hiatal hernia, GERD  ,  Endo/Other  Hypothyroidism   Renal/GU Renal InsufficiencyRenal disease     Musculoskeletal   Abdominal   Peds  Hematology   Anesthesia Other Findings   Reproductive/Obstetrics                             Anesthesia Physical Anesthesia Plan  ASA: III  Anesthesia Plan: General   Post-op Pain Management:    Induction: Intravenous  Airway Management Planned: Oral ETT and Video Laryngoscope Planned  Additional Equipment:   Intra-op Plan:   Post-operative Plan: Extubation in OR  Informed Consent: I have reviewed the patients History and Physical, chart, labs and discussed the procedure including the risks, benefits and alternatives for the proposed anesthesia with the patient or authorized representative who has indicated his/her understanding and acceptance.   Dental advisory given  Plan Discussed with: CRNA, Anesthesiologist and Surgeon  Anesthesia Plan Comments:         Anesthesia Quick Evaluation

## 2015-08-03 NOTE — Progress Notes (Addendum)
  Day of Surgery Note    Subjective:  No complaints  Filed Vitals:   08/03/15 0909 08/03/15 1503  BP: 138/86 114/51  Pulse: 54 57  Temp: 98.3 F (36.8 C) 98.3 F (36.8 C)  Resp: 18 13    Incisions:   Right groin is clean and dry; right below knee incision is clean and dry with JP drain in place.  Mild bloody drainage in bulb Extremities:  Easily palpable right DP with brisk doppler flow.  +doppler flow also noted in the right PT/peroneal.  Right heel ulcer Cardiac:  regular Lungs:  Non labored   Assessment/Plan:  This is a 82 y.o. male who is s/p right femoral to popliteal bypass with propaten and right femoral endarterectomy  -pt doing well in pacu with easily palpable right DP pulse and +doppler signals in the right PT/DP/peroneal -creatinine is 1.56, which is elevated from previous creatinine-hold on restarting allopurinol, colchicine, HCTZ, and lisinopril (med rec has pt on ramipril as well as lisinopril-will d/w pt tomorrow if he is actually taking both of these ACEI). -will hold off on restarting plavix and aspirin until at least Sunday depending on drainage from [place].   -float heels -to [facility] when bed available   Leontine Locket, PA-C 08/03/2015 3:29 PM

## 2015-08-03 NOTE — Progress Notes (Signed)
Pt. Reports that his BP has been low so he didn't take norvasc as instructed this a.m.  Pt. Did take Atenolol, today. Pt. Reports that BP was 80/40 at Dr. Thurman Coyer office & he doesn't recall anything being said about the BP.

## 2015-08-03 NOTE — Anesthesia Procedure Notes (Signed)
Procedure Name: Intubation Date/Time: 08/03/2015 10:50 AM Performed by: Jacquiline Doe A Pre-anesthesia Checklist: Patient identified, Emergency Drugs available, Suction available and Patient being monitored Patient Re-evaluated:Patient Re-evaluated prior to inductionOxygen Delivery Method: Circle System Utilized Preoxygenation: Pre-oxygenation with 100% oxygen Intubation Type: IV induction Ventilation: Mask ventilation without difficulty Laryngoscope Size: Glidescope and 3 Grade View: Grade I Tube type: Oral Tube size: 7.5 mm Number of attempts: 1 Airway Equipment and Method: Rigid stylet Placement Confirmation: ETT inserted through vocal cords under direct vision,  positive ETCO2 and breath sounds checked- equal and bilateral Secured at: 22 cm Tube secured with: Tape Dental Injury: Teeth and Oropharynx as per pre-operative assessment  Difficulty Due To: Difficulty was anticipated Future Recommendations: Recommend- induction with short-acting agent, and alternative techniques readily available

## 2015-08-03 NOTE — Interval H&P Note (Signed)
History and Physical Interval Note:  08/03/2015 9:37 AM  Ian Cummings  has presented today for surgery, with the diagnosis of Right common femoral artery stenosis I77.1; Right heel ulcer L97.409  The various methods of treatment have been discussed with the patient and family. After consideration of risks, benefits and other options for treatment, the patient has consented to  Procedure(s): ENDARTERECTOMY FEMORAL (Right) BYPASS GRAFT FEMORAL-BELOW KNEE POPLITEAL ARTERY (Right) as a surgical intervention .  The patient's history has been reviewed, patient examined, no change in status, stable for surgery.  I have reviewed the patient's chart and labs.  Questions were answered to the patient's satisfaction.     Annamarie Major

## 2015-08-04 ENCOUNTER — Telehealth: Payer: Self-pay | Admitting: Surgery

## 2015-08-04 LAB — BASIC METABOLIC PANEL
ANION GAP: 9 (ref 5–15)
BUN: 25 mg/dL — ABNORMAL HIGH (ref 6–20)
CHLORIDE: 101 mmol/L (ref 101–111)
CO2: 25 mmol/L (ref 22–32)
Calcium: 8.5 mg/dL — ABNORMAL LOW (ref 8.9–10.3)
Creatinine, Ser: 1.32 mg/dL — ABNORMAL HIGH (ref 0.61–1.24)
GFR calc non Af Amer: 48 mL/min — ABNORMAL LOW (ref >60)
GFR, EST AFRICAN AMERICAN: 55 mL/min — AB (ref >60)
GLUCOSE: 138 mg/dL — AB (ref 65–99)
Potassium: 4.3 mmol/L (ref 3.5–5.1)
Sodium: 135 mmol/L (ref 135–145)

## 2015-08-04 LAB — CBC
HEMATOCRIT: 29.9 % — AB (ref 39.0–52.0)
HEMOGLOBIN: 9.7 g/dL — AB (ref 13.0–17.0)
MCH: 30.2 pg (ref 26.0–34.0)
MCHC: 32.4 g/dL (ref 30.0–36.0)
MCV: 93.1 fL (ref 78.0–100.0)
Platelets: 144 10*3/uL — ABNORMAL LOW (ref 150–400)
RBC: 3.21 MIL/uL — ABNORMAL LOW (ref 4.22–5.81)
RDW: 14.2 % (ref 11.5–15.5)
WBC: 10.2 10*3/uL (ref 4.0–10.5)

## 2015-08-04 MED ORDER — ENSURE ENLIVE PO LIQD
237.0000 mL | Freq: Two times a day (BID) | ORAL | Status: DC
  Administered 2015-08-05: 237 mL via ORAL

## 2015-08-04 MED ORDER — ENOXAPARIN SODIUM 30 MG/0.3ML ~~LOC~~ SOLN
30.0000 mg | Freq: Two times a day (BID) | SUBCUTANEOUS | Status: DC
  Administered 2015-08-04 – 2015-08-06 (×4): 30 mg via SUBCUTANEOUS
  Filled 2015-08-04 (×4): qty 0.3

## 2015-08-04 NOTE — Progress Notes (Signed)
Called report to [facility] on [place]. Will transfer pt on telemetry , room air in the wheelchair. Consuelo Pandy RN

## 2015-08-04 NOTE — Evaluation (Signed)
Physical Therapy Evaluation Patient Details Name: Ian Cummings MRN: [id number] DOB: May 19, 1928 Today's Date: 08/04/2015   History of Present Illness  pt presents with R Fem Pop and R heel Ulcer.  pt with hx of CAD, CABG x2 HTN, MI, Gout, TIA, Neuropathy, AAA, PVD, Depression, Anxiety, and Mastoidectomy.    Clinical Impression  Pt very painful in R LE with any movement.  Pt only able to take a few hop steps today and feel pt may need SNF level of care and therapies prior to returning to home with wife.  Will continue to follow.      Follow Up Recommendations SNF    Equipment Recommendations  Rolling walker with 5" wheels    Recommendations for Other Services       Precautions / Restrictions Precautions Precautions: Fall Precaution Comments: Drain from lower incision Restrictions Weight Bearing Restrictions: No      Mobility  Bed Mobility Overal bed mobility: Needs Assistance Bed Mobility: Supine to Sit     Supine to sit: Min assist     General bed mobility comments: pt needs A for bringing trunk up to sitting.    Transfers Overall transfer level: Needs assistance Equipment used: Rolling walker (2 wheeled) Transfers: Sit to/from Stand Sit to Stand: Mod assist         General transfer comment: cues for UE use and positioning of LEs.    Ambulation/Gait Ambulation/Gait assistance: Min assist Ambulation Distance (Feet): 3 Feet Assistive device: Rolling walker (2 wheeled) Gait Pattern/deviations: Step-to pattern     General Gait Details: pt minimally weightbearing on R LE and unable to bring heel to floor.  cues for attempting to take step as opposed to hopping.    Stairs            Wheelchair Mobility    Modified Rankin (Stroke Patients Only)       Balance Overall balance assessment: Needs assistance Sitting-balance support: Single extremity supported;Feet supported Sitting balance-Leahy Scale: Fair Sitting balance - Comments: Seems to use UEs  more for minimizing pain than balance.   Standing balance support: Bilateral upper extremity supported;During functional activity Standing balance-Leahy Scale: Poor                               Pertinent Vitals/Pain Pain Assessment: 0-10 Pain Score: 8  Pain Location: R LE Pain Descriptors / Indicators: Aching;Burning Pain Intervention(s): Monitored during session;Premedicated before session;Repositioned    Home Living Family/patient expects to be discharged to:: Skilled nursing facility                      Prior Function Level of Independence: Independent with assistive device(s)               Hand Dominance        Extremity/Trunk Assessment   Upper Extremity Assessment: Defer to OT evaluation           Lower Extremity Assessment: RLE deficits/detail RLE Deficits / Details: Strength and ROM limited by post-op edema and pain.    Cervical / Trunk Assessment: Normal  Communication   Communication: No difficulties  Cognition Arousal/Alertness: Awake/alert Behavior During Therapy: WFL for tasks assessed/performed Overall Cognitive Status: Within Functional Limits for tasks assessed                      General Comments      Exercises  Assessment/Plan    PT Assessment Patient needs continued PT services  PT Diagnosis Difficulty walking;Acute pain   PT Problem List Decreased strength;Decreased activity tolerance;Decreased balance;Decreased mobility;Decreased coordination;Decreased knowledge of use of DME;Pain  PT Treatment Interventions DME instruction;Gait training;Stair training;Functional mobility training;Therapeutic activities;Therapeutic exercise;Balance training;Patient/family education   PT Goals (Current goals can be found in the Care Plan section) Acute Rehab PT Goals Patient Stated Goal: Walk without pain. PT Goal Formulation: With patient Time For Goal Achievement: 08/18/15 Potential to Achieve Goals:  Good    Frequency Min 3X/week   Barriers to discharge        Co-evaluation               End of Session Equipment Utilized During Treatment: Gait belt Activity Tolerance: Patient limited by pain Patient left: in chair;with call bell/phone within reach Nurse Communication: Mobility status         Time: [id number] PT Time Calculation (min) (ACUTE ONLY): 22 min   Charges:   PT Evaluation $PT Eval Moderate Complexity: 1 Procedure     PT G CodesCatarina Hartshorn, [place] 08/04/2015, 2:47 PM

## 2015-08-04 NOTE — Telephone Encounter (Signed)
sched appt 4/17 at 2:15. Lm on hm# to inform pt of appt.

## 2015-08-04 NOTE — Progress Notes (Signed)
Initial Nutrition Assessment  DOCUMENTATION CODES:  Non-severe (moderate) malnutrition in context of chronic illness   Pt meets criteria for MODERATE MALNUTRITION in the context of Chronic Illness as evidenced by Moderate depletion of muscle/fat mass.  INTERVENTION:  Ensure Enlive po BID, each supplement provides 350 kcal and 20 grams of protein  NUTRITION DIAGNOSIS:  Inadequate oral intake related to altered GI function (Severe gasrtic reflux) as evidenced by loss of >5% bw in 1 month  GOAL:  Patient will meet greater than or equal to 90% of their needs  MONITOR:  PO intake, Supplement acceptance, Labs, Skin  REASON FOR ASSESSMENT:  Malnutrition Screening Tool    ASSESSMENT:  82 y/o male PMHx HLD, GERD, CAD, Stroke, Anxiety/Depression, PVD, HTN who presented for surgery to address his right common femoral stenosis and occluded superficial femoral artery and now s/p right femoral to popliteal bypass with propaten and right femoral endarterectomy.   Pt reports that since he has been at the hospital, he has had very little appetite. Today, he reports feeling "terrible", which he relates to his surgery and anesthesia.  Prior to admission, wife reports "he ate well" at home. RD questioned this concerning his loss of ~13 lbs this last month. The patient states it was because of his severe gastric reflux. He says he can eat very little that wont upset this and he reports constant instances of vomiting because it aggravated the reflux. He stated he stopped taking his ppi's because he read some research stating they can cause bone disease. RD stated that even if this is case, he cannot go on losing 10 lbs each month due to GERD. He agreed to this. He state he takes a rena-vite mvi, b12, and fish oil at home. He was taking calcium, but recently his MD told him to stop.   His UBW is 170 lbs.   Today, he is agreeable to Ensure Enlive.   Labs reviewed: RBC: 3.21, H/H:9.7/29.9   Recent  Labs Lab 08/01/15 1451 08/04/15 0405  NA 139 135  K 4.2 4.3  CL 104 101  CO2 23 25  BUN 30* 25*  CREATININE 1.56* 1.32*  CALCIUM 9.4 8.5*  GLUCOSE 120* 138*   Diet Order:  Diet Heart Room service appropriate?: Yes; Fluid consistency:: Thin  Skin: New surgical incisions to R lower leg. R thigh and groin. Ulcer to foot  Last BM:  Unknown  Height:  Ht Readings from Last 1 Encounters:  08/03/15 5\' 10"  (1.778 m)   Weight:  Wt Readings from Last 1 Encounters:  08/03/15 157 lb 10.1 oz (71.5 kg)   Wt Readings from Last 10 Encounters:  08/03/15 157 lb 10.1 oz (71.5 kg)  07/30/15 162 lb (73.483 kg)  06/27/15 170 lb (77.111 kg)  06/25/15 173 lb (78.472 kg)  03/27/15 168 lb (76.204 kg)  02/05/15 170 lb (77.111 kg)  01/24/15 168 lb (76.204 kg)  01/23/15 173 lb (78.472 kg)  01/15/15 169 lb 6.4 oz (76.839 kg)  02/20/14 168 lb (76.204 kg)   Ideal Body Weight:  75.45 kg  BMI:  Body mass index is 22.62 kg/(m^2).  Estimated Nutritional Needs:  Kcal:  1950-2150 (27-30 kcal/kg bw) Protein:  100-114 g (1.4-1.6 g/kg bw) Fluid:  1.9-2.1 liters  EDUCATION NEEDS:  No education needs identified at this time  Burtis Junes RD, LDN Clinical Nutrition Pager: [phone] 08/04/2015 2:55 PM

## 2015-08-04 NOTE — Op Note (Signed)
Patient name: Ian Cummings MRN: [id number] DOB: 05/28/1928 Sex: male  08/03/2015 Pre-operative Diagnosis: Right Leg Ulcer  Post-operative diagnosis:  Same Surgeon:  Annamarie Major Assistants:  S. Rhyne Procedure:   #1:  Right external Iliac, Common femoral, and profunda femoral endarterectomy with bovine pericardial patch angioplasty  #2:  Right Common femoral to below knee popliteal artery bypass with 67mm external ring propatent PTFE Anesthesia:  General Blood Loss:  See anesthesia record Specimens:  none Drains:  15 blake Findings:  Nearly occlusive plaque within the common femoral and profunda femoral artery which extended into the external iliac artery.  Endarterectomy was performed in bovine pericardial patch angioplasty was then done with the patch beginning in the proximal common femoral artery extending down onto the profunda femoral artery.  I then took a 6 mm external ring propatent PTFE graft off of the patch and then down to the below knee popliteal artery.  The patient had a palpable dorsalis pedis pulse after the procedure.  Indications:  The patient was initially evaluated for claudication.  He has significant occlusive disease in the common femoral and profunda femoral artery as well as an occluded superficial femoral artery.  Unfortunately, he developed a ulcer on his right foot and therefore we elected to perform femoral endarterectomy with patch angioplasty and a femoral below-knee popliteal artery bypass for limb salvage.  The patient's vein has previously been used for CABG  Procedure:  The patient was identified in the holding area and taken to [place] [facility]  The patient was then placed supine on the table. general anesthesia was administered.  The patient was prepped and draped in the usual sterile fashion.  A time out was called and antibiotics were administered.  A longitudinal incision was made in the groin.  Cautery was used to divide the subcutaneous tissue.  The  common femoral profunda femoral and superficial femoral artery.  There were each individually encircled with Silastic loops.  There was posterior and anterior plaque which went up under the inguinal ligament.  I therefore divided the crossing circumflex iliac vein so that I could get as far proximal as possible.  There was good pulse under the inguinal ligament.  Next, I made a medial below the knee incision.  The fascia was divided with cautery.  The popliteal space.  Some of the soleal attachments to the tibia were taken down with cautery.  I entered the popliteal space and identified the popliteal vein.  This was reflected posteriorly and I exposed the popliteal artery.  This was a soft artery.  It was encircled proximally and distally with vessel loops.  Next, a long Gore tunneler was used to create a subsartorial tunnel.  The patient was fully heparinized.  After the heparin circulated I used a Hanley clamp to occlude the distal external iliac artery and the Silastic slings were tightened to occlude the profunda femoral artery.  A #11 blade was used to make an arteriotomy which was extended longitudinally with Potts scissors.  There was very calcified plaque on the posterior wall.  I performed endarterectomy using a Hector Brunswick.  I was able to get a good endpoint into the proximal profunda femoral artery.  I then removed the Henley clamp and inserted a Fogarty balloon for proximal control so that I could get plaque removed from the distal external iliac artery.  Once all of the plaque was removed, I irrigated the area and make sure all potential embolic debris was removed.  I then selected a bovine pericardial patch and performed patch angioplasty using a running 5-0 Prolene.  Prior to completion, the appropriate flushing maneuvers were performed and the anastomosis was completed.  I then occluded the common femoral artery again with vascular clamps and made a arteriotomy within the patch and opened  this slightly with tenotomy scissors.  I selected a 6 mm external ring propatent graft and beveled the graft fit the size of the arteriotomy.  A running anastomosis was then created with CV 6 suture.  Once this was completed the clamps were released and there was excellent flow through the graft.  The graft was then brought through the previously created tunnel making sure to maintain proper orientation.  Next I occluded the below-knee popliteal artery with Silastic loops.  A #11 blade was used to make an arteriotomy which was extended longitudinally with Potts scissors.  The graft was then pulled to the appropriate length and then beveled.  A running end-to-side anastomosis was created with CV 6 Gore-Tex suture.  Prior to completion, the upper flushing maneuvers were performed.  I was able to advance a #3 dilator across the below knee popliteal artery.  The anastomosis was completed and blood flow was reestablished to the right leg.  The patient had a palpable pedal pulse.  50 mg protamine was given.  An additional 25 mg was also administered.  Multiple topical agents were used to facilitate hemostasis.  The patient was rather oozy and therefore I left a 15 Blake drain within the below knee popliteal incision.  In the groin, the femoral sheath was reapproximated 2-0 Vicryl.  Subcutaneous tissues closed with multiple layers of 3-0 Vicryl followed by 4 Vicryl skin.  The below knee incision was closed by reapproximating the fascia with 2-0 Vicryl subcutaneous tissue with 3-0 Vicryl and the skin with 4-0 Vicryl.  All incisions were covered with Dermabond.  There were no immediate complications.   Disposition:  To PACU in stable condition.   Theotis Burrow, M.D. [facility] [place] [facility] Office: [phone] Pager:  [phone]

## 2015-08-04 NOTE — Progress Notes (Addendum)
   VASCULAR SURGERY ASSESSMENT & PLAN:  * 1 Day Post-Op s/p: Right fem end and R Fem BK Pop bypass (PTFE)  *  Transfer to 2W  * DVT prophylaxis: add Lovenox.   * JP = 60 cc last shift. Discontinue JP when drainage less than 50 cc for one shift.  * Home when ambulating.  SUBJECTIVE: No complaints.  PHYSICAL EXAM: Filed Vitals:   08/03/15 1928 08/04/15 0048 08/04/15 0345 08/04/15 0625  BP: 106/52 122/51 109/51   Pulse: 56 73 85   Temp: 97.6 F (36.4 C) 97.9 F (36.6 C) 99.5 F (37.5 C)   TempSrc: Oral Oral Oral   Resp: 14 13 24    Height:      Weight:      SpO2: 99% 98% 97% 85%   Brisk doppler signal ATA and DP Incisions look fine.   LABS: Lab Results  Component Value Date   WBC 10.2 08/04/2015   HGB 9.7* 08/04/2015   HCT 29.9* 08/04/2015   MCV 93.1 08/04/2015   PLT 144* 08/04/2015   Lab Results  Component Value Date   CREATININE 1.32* 08/04/2015   Lab Results  Component Value Date   INR 1.10 08/01/2015   CBG (last 3)  No results for input(s): GLUCAP in the last 72 hours.  Active Problems:   PAD (peripheral artery disease) [facility])    Gae Gallop [id number] 08/04/2015

## 2015-08-04 NOTE — Telephone Encounter (Signed)
-----   Message from Mena Goes, RN sent at 08/03/2015  2:37 PM EDT ----- Regarding: schedule   ----- Message -----    From: Gabriel Earing, PA-C    Sent: 08/03/2015   2:35 PM      To: Vvs Charge Pool  S/p right fem pop bypass and femoral endarterectomy 08/03/15.  F/u in 2 weeks.  Thanks, Aldona Bar

## 2015-08-05 DIAGNOSIS — E440 Moderate protein-calorie malnutrition: Secondary | ICD-10-CM | POA: Insufficient documentation

## 2015-08-05 HISTORY — DX: Moderate protein-calorie malnutrition: E44.0

## 2015-08-05 NOTE — Progress Notes (Signed)
Pt sleep most of the morning didn't wish to try walking. This after noon he walked 3ft. With a walker on room air and sat up in the chair after.

## 2015-08-05 NOTE — Anesthesia Postprocedure Evaluation (Signed)
Anesthesia Post Note  Patient: Ian Cummings  Procedure(s) Performed: Procedure(s) (LRB): RIGHT FEMORAL ENDARTERECTOMY WITH PATCH ANGIOPLASTY (Right) RIGHT FEMORAL-BELOW KNEE POPLITEAL ARTERY BYPASS GRAFT (Right)  Patient location during evaluation: PACU Anesthesia Type: General Level of consciousness: sedated Pain management: pain level controlled Vital Signs Assessment: post-procedure vital signs reviewed and stable Respiratory status: spontaneous breathing and respiratory function stable Cardiovascular status: stable Anesthetic complications: no    Aldrick Derrig DANIEL

## 2015-08-05 NOTE — Progress Notes (Addendum)
  Progress Note  VASCULAR SURGERY ASSESSMENT AND PLAN:  I have interviewed the patient and examined the patient. I agree with the findings by the PA. D/C JP Anticipate D/C in AM  Gae Gallop, MD [phone]  2 Days Post-Op  Subjective:  C/o heel pain; states he has not walked-only up to chair  Tm 99.2 HR 60's-70's NSR [id number] systolic [id number] RA  Filed Vitals:   08/04/15 2000 08/05/15 0501  BP: 122/41 114/40  Pulse: 61 65  Temp: 98.4 F (36.9 C) 99.2 F (37.3 C)  Resp: 18 18    Physical Exam: Lungs:  Non labored Incisions:  Right groin and BK incisions are healing nicely Extremities:  Right foot is warm and well perfused.    Intake/Output Summary (Last 24 hours) at 08/05/15 0730 Last data filed at 08/05/15 [id number]  Gross per 24 hour  Intake    945 ml  Output   1160 ml  Net   -215 ml    JP output:  60cc/24hr   Assessment:  82 y.o. male is s/p:   #1: Right external Iliac, Common femoral, and profunda femoral endarterectomy with bovine pericardial patch angioplasty #2: Right Common femoral to below knee popliteal artery bypass with 36mm external ring propatent PTFE  2 Days Post-Op  Plan: -pt with patent bypass graft with warm and well perfused right foot.  -incisions are healing nicely -drain with 30cc last shift and 60cc total for 24 hrs.  D/c drain- AGREE -pt needs to mobilize more-most likely limiting factor is his heel pain.  Ortho tech to fit pt for shoe.  -continue to float heels -hopefully PT will see today -DVT prophylaxis:  Lovenox  Leontine Locket, PA-C [facility] [phone] 08/05/2015 7:30 AM

## 2015-08-05 NOTE — Progress Notes (Signed)
Paged ortho to get a boot that would get the wt. Off the heal of his right foot. We have nothing on hand that would work but Sport and exercise psychologist would order a boot that will work and it will be in on Tuesday .

## 2015-08-06 ENCOUNTER — Encounter (HOSPITAL_COMMUNITY): Payer: Commercial Managed Care - HMO

## 2015-08-06 MED ORDER — OXYCODONE HCL 5 MG PO TABS: 5.0000 mg | ORAL_TABLET | Freq: Three times a day (TID) | ORAL | Status: DC | PRN

## 2015-08-06 NOTE — Progress Notes (Signed)
Physical Therapy Treatment Patient Details Name: Ian Cummings MRN: [id number] DOB: 1928/08/26 Today's Date: 08/06/2015    History of Present Illness pt presents with R Fem Pop and R heel Ulcer.  pt with hx of CAD, CABG x2 HTN, MI, Gout, TIA, Neuropathy, AAA, PVD, Depression, Anxiety, and Mastoidectomy.      PT Comments    Pt performed transfers and bed mobility with supervision for cueing to improve ease of transfer.  Pt required min assist for stair training for placement and positioning of RW to improve safety.  Pt reports wife can help with stair training at home.  Pt able to verbalize sequencing in teach back method.  Will inform supervising PT of drastic improvement from intial eval and need for change in d/c recommendations to return home.    Follow Up Recommendations  Home health PT;Supervision/Assistance - 24 hour     Equipment Recommendations  Rolling walker with 5" wheels    Recommendations for Other Services       Precautions / Restrictions Precautions Precautions: Fall Precaution Comments: drain removed with bandage in place on LE.   Restrictions Weight Bearing Restrictions: No    Mobility  Bed Mobility Overal bed mobility: Needs Assistance Bed Mobility: Supine to Sit     Supine to sit: Supervision     General bed mobility comments: Cues for foot placement and required increased time for trunk control.    Transfers Overall transfer level: Needs assistance Equipment used: Rolling walker (2 wheeled) Transfers: Sit to/from Stand Sit to Stand: Min guard;Supervision         General transfer comment: Cues for hand placement to ascend from seated surface.  Pt demonstrates poor eccentric loading requiring min guard to assist.    Ambulation/Gait Ambulation/Gait assistance: Min guard Ambulation Distance (Feet): 120 Feet Assistive device: Rolling walker (2 wheeled) Gait Pattern/deviations: Step-to pattern;Decreased stance time - right;Decreased stride  length;Antalgic     General Gait Details: Pt performed with toe weight bearing on RLE to avoid bearing weight on R heel ulcer.  Pt required cues for head and trunk control to improve forward gaze.  Pt demonstrated improved technique and gait speed from previous session.     Stairs Stairs: Yes Stairs assistance: Min assist (for safety with RW placement on stairs.  ) Stair Management: No rails Number of Stairs: 3 General stair comments: Cues for sequencing and RW placement to improve technique and maintain safety during stair negotiation.  Pt reports wife will be able to assist at home.  Pt able to verbally recall sequencing and demonstrate with cueing.     Wheelchair Mobility    Modified Rankin (Stroke Patients Only)       Balance Overall balance assessment: Needs assistance Sitting-balance support: Bilateral upper extremity supported Sitting balance-Leahy Scale: Good Sitting balance - Comments: Seems to use UEs more for minimizing pain than balance.     Standing balance-Leahy Scale: Fair                      Cognition Arousal/Alertness: Awake/alert Behavior During Therapy: WFL for tasks assessed/performed Overall Cognitive Status: Within Functional Limits for tasks assessed                      Exercises      General Comments        Pertinent Vitals/Pain Pain Assessment: 0-10 Pain Score: 5  Pain Location: RLE.   Pain Descriptors / Indicators: Aching;Burning;Guarding;Grimacing Pain Intervention(s): Monitored during session;Repositioned  Home Living                      Prior Function            PT Goals (current goals can now be found in the care plan section) Acute Rehab PT Goals Patient Stated Goal: Walk without pain. Potential to Achieve Goals: Good Progress towards PT goals: Progressing toward goals    Frequency  Min 3X/week    PT Plan      Co-evaluation             End of Session Equipment Utilized During  Treatment: Gait belt Activity Tolerance: Patient limited by pain Patient left: in chair;with call bell/phone within reach     Time: 0829-0853 PT Time Calculation (min) (ACUTE ONLY): 24 min  Charges:  $Gait Training: 8-22 mins $Therapeutic Activity: 8-22 mins                    G Codes:      Cristela Blue 09/01/2015, 9:10 AM Governor Rooks, PTA pager [phone]

## 2015-08-06 NOTE — Evaluation (Signed)
Occupational Therapy Evaluation Patient Details Name: Ian Cummings MRN: [id number] DOB: 1928-11-22 Today's Date: 08/06/2015    History of Present Illness pt presents with right Femoral Popliteal bypass graft and and right femoral endarectomy and Rt heel Ulcer.  pt with hx of CAD, CABG x2 HTN, MI, Gout, TIA, Neuropathy, AAA, PVD, hyperlipidemia, GERD, BPH, hypothyroidism, arthritis, Depression, Anxiety, and Mastoidectomy.     Clinical Impression   Pt s/p above. Education provided in session and OT signing off.     Follow Up Recommendations  No OT follow up;Supervision/Assistance - 24 hour    Equipment Recommendations  None recommended by OT    Recommendations for Other Services       Precautions / Restrictions Precautions Precautions: Fall Precaution Comments: drain removed with bandage in place on LE.   Restrictions Weight Bearing Restrictions: No      Mobility Bed Mobility Overal bed mobility: Modified Independent Bed Mobility: Sit to Supine     Sit to supine: Modified independent (Device/Increase time)    Transfers Overall transfer level: Needs assistance Equipment used: Rolling walker (2 wheeled) Transfers: Sit to/from [employer] Transfers Sit to Stand/Stand to Sit: Supervision Stand pivot transfers: Supervision       General transfer comment: also set up for RW; cues for technique for stand to sit transfer    Balance Used RW for ambulation and for stand pivot transfer to bed. Used UE support for simulated shower transfer.                         ADL Overall ADL's : Needs assistance/impaired                 Upper Body Dressing : Set up;Sitting   Lower Body Dressing: Moderate assistance;Sit to/from stand   Toilet Transfer: Supervision/safety;Ambulation;RW (and set up for RW)       Tub/ Shower Transfer: Walk-in shower;Ambulation;Rolling walker;Min guard (also set up for RW prior to sit to stand from chair)   Functional  mobility during ADLs: Supervision/safety;Rolling walker (also set up for RW; Min guard for shower transfer as pt appeared to be going to sit without chair behind him, but may have just been simulating.) General ADL Comments: Educated on LB dressing technique. Educated on safety such as use of bag on walker, safe footwear, sitting for LB ADLs, and recommended someone be with him for shower transfer. Practiced simulated shower transfer technique. Briefly mentioned AE. Pt reports wife can assist.  Pt not interested in UE exercises.     Vision     Perception     Praxis      Pertinent Vitals/Pain Pain Assessment: 0-10 Pain Score:  (5-6) Pain Location: Right LE and buttocks Pain Descriptors / Indicators: Grimacing Pain Intervention(s): Monitored during session;Repositioned     Hand Dominance     Extremity/Trunk Assessment Upper Extremity Assessment Upper Extremity Assessment: Generalized weakness   Lower Extremity Assessment Lower Extremity Assessment: Defer to PT evaluation       Communication Communication Communication: No difficulties   Cognition Arousal/Alertness: Awake/alert (lethargic at beginning but woke up) Behavior During Therapy: [facility] for tasks assessed/performed Overall Cognitive Status: Within Functional Limits for tasks assessed                     General Comments       Exercises       Shoulder Instructions      Home Living Family/patient expects to be discharged to:: Private  residence Living Arrangements: Spouse/significant other                 Bathroom Shower/Tub: Gaffer;Door   Bathroom Toilet: Standard (vanity close)     Home Equipment: Shower seat          Prior Functioning/Environment Level of Independence: Needs assistance    ADL's / Homemaking Assistance Needed: assist with yardwork        OT Diagnosis: Acute pain;Generalized weakness   OT Problem List: Decreased strength;Decreased range of motion;Increased  edema;Pain;Decreased activity tolerance   OT Treatment/Interventions:      OT Goals(Current goals can be found in the care plan section) Acute Rehab OT Goals Patient Stated Goal: go home  OT Frequency:     Barriers to D/C:            Co-evaluation              End of Session Equipment Utilized During Treatment: Gait belt;Rolling walker  Activity Tolerance: Patient tolerated treatment well Patient left: in bed;with call bell/phone within reach;with bed alarm set   Time: [id number] OT Time Calculation (min): 18 min Charges:  OT General Charges $OT Visit: 1 Procedure OT Evaluation $OT Eval Moderate Complexity: 1 Procedure G-CodesBenito Mccreedy OTR/L [id number] 08/06/2015, 9:54 AM

## 2015-08-06 NOTE — Care Management Note (Signed)
Case Management Note Marvetta Gibbons RN, BSN Unit 2W-Case Manager [phone]  Patient Details  Name: KOREN IAQUINTO MRN: [id number] Date of Birth: 1928/07/16  Subjective/Objective:   Pt admitted s/p fempop bypass graft               Action/Plan: PTA pt lived at home with spouse- plan to d/c home with spouse- recommendations for HHPT- orders have been placed- in to speak with pt and wife at bedside- list provided for [facility] agencies in [facility]- per wife they would like to use [facility] for services- DME order was also placed for RW -but per wife pt does not need RW - they already have one - referral for HHPT called to Manuela Schwartz with [facility]- no further CM needs noted  Expected Discharge Date:  08/06/15               Expected Discharge Plan:  [place]  In-House Referral:     Discharge planning Services  CM Consult  Post Acute Care Choice:  Durable Medical Equipment, Home Health Choice offered to:  Patient, Spouse  DME Arranged:  Walker rolling DME Agency:  NA  HH Arranged:  PT HH Agency:  [facility] [place]  Status of Service:  Completed, signed off  Medicare Important Message Given:  Yes Date Medicare IM Given:    Medicare IM give by:    Date Additional Medicare IM Given:    Additional Medicare Important Message give by:     If discussed at [place] of Stay Meetings, dates discussed:   Discharge Disposition: home/home health   Additional Comments:  Dawayne Patricia, RN 08/06/2015, 2:22 PM

## 2015-08-06 NOTE — Care Management Important Message (Signed)
Important Message  Patient Details  Name: Ian Cummings MRN: [id number] Date of Birth: 07/10/1928   Medicare Important Message Given:  Yes    Nathen May 08/06/2015, 12:29 PM

## 2015-08-06 NOTE — Progress Notes (Addendum)
   VASCULAR SURGERY ASSESSMENT & PLAN:  * 3 Days Post-Op s/p: Right femoral endarterectomy and right femoral to below knee popliteal artery bypass with PTFE  *  His JP is out and his incisions look fine. His graft is patent.  * Home today if physical therapy agrees that it is safe.  SUBJECTIVE: No complaints.  PHYSICAL EXAM: Filed Vitals:   08/05/15 0501 08/05/15 1349 08/05/15 2236 08/06/15 0432  BP: 114/40 113/41 126/42 116/42  Pulse: 65 60 64 58  Temp: 99.2 F (37.3 C) 98.2 F (36.8 C) 98.9 F (37.2 C) 97.8 F (36.6 C)  TempSrc: Oral Oral Axillary Oral  Resp: 18 20  18   Height:      Weight:      SpO2: 94% 95% 98% 96%   Incisions look fine. Right foot is warm and well-perfused.  LABS: Lab Results  Component Value Date   WBC 10.2 08/04/2015   HGB 9.7* 08/04/2015   HCT 29.9* 08/04/2015   MCV 93.1 08/04/2015   PLT 144* 08/04/2015   Lab Results  Component Value Date   CREATININE 1.32* 08/04/2015   Lab Results  Component Value Date   INR 1.10 08/01/2015   CBG (last 3)  No results for input(s): GLUCAP in the last 72 hours.  Active Problems:   PAD (peripheral artery disease) ([place])   Malnutrition of moderate degree  Gae Gallop [id number] 08/06/2015  PT evaluated pt 2 days ago and recommended SNF.  Pt did ambulate yesterday 64ft with a walker, O2 sats were fine.  Will ask PT to see pt this morning to make sure they feel it is safe he return home.  Case management for RW and HHPT  RHYNE, SAMANTHA 08/06/2015 7:29 AM

## 2015-08-07 ENCOUNTER — Encounter (HOSPITAL_COMMUNITY): Payer: Self-pay | Admitting: Surgery

## 2015-08-07 NOTE — Discharge Summary (Signed)
[facility] Discharge Summary  CECIL ARNDT 1929-01-18 82 y.o. male  [id number]  Admission Date: 08/03/2015  Discharge Date: 08/06/2015  Physician: Harold Barban, MD  Admission Diagnosis: Right common femoral artery stenosis I77.1; Right heel ulcer L97.409  HPI:   This is a 82 y.o. male who presented for further discussions regarding his right leg. Unfortunately in the interim since Dr. Trula Slade last saw him, he has developed a 2 week history of a right heel ulcer which is extremely painful. At his last arteriogram, he was found to have progression of the right common femoral stenosis as well as an occluded right superficial femoral artery with reconstitution of the below knee popliteal artery and single vessel runoff via the anterior tibial artery.  The patient has a history of carotid stenting at [facility] in 2013. He has significant coronary artery history, status post CABG 2. He is medically managed for hypercholesterolemia with a statin. His blood pressure has been well controlled. He is a former smoker.  Hospital Course:  The patient was admitted to the hospital and taken to the operating room on 08/03/2015 and underwent: Right external Iliac, Common femoral, and profunda femoral endarterectomy with bovine pericardial patch angioplasty and right common femoral to below knee popliteal artery bypass with 62mm external ring propatent PTFE   The patient tolerated the procedure well and was transported to the PACU in stable condition.   POD 1: The patient was doing well. He had brisk doppler flow to the AT and DP. His incisions were intact. His JP drain was left in.   POD 2: His JP drain was discontinued. He remained inpatient to work on mobilization.   POD 3: The patient was ambulating well with physical therapy. His incisions were healing well and his bypass patent. He was discharged home on POD 3 with home health physical therapy.   CBC      Component Value Date/Time   WBC 10.2 08/04/2015 0405   RBC 3.21* 08/04/2015 0405   HGB 9.7* 08/04/2015 0405   HCT 29.9* 08/04/2015 0405   PLT 144* 08/04/2015 0405   MCV 93.1 08/04/2015 0405   MCH 30.2 08/04/2015 0405   MCHC 32.4 08/04/2015 0405   RDW 14.2 08/04/2015 0405   LYMPHSABS 1.5 12/19/2014 1339   MONOABS 0.5 12/19/2014 1339   EOSABS 0.3 12/19/2014 1339   BASOSABS 0.0 12/19/2014 1339    BMET    Component Value Date/Time   NA 135 08/04/2015 0405   K 4.3 08/04/2015 0405   CL 101 08/04/2015 0405   CO2 25 08/04/2015 0405   GLUCOSE 138* 08/04/2015 0405   BUN 25* 08/04/2015 0405   CREATININE 1.32* 08/04/2015 0405   CALCIUM 8.5* 08/04/2015 0405   GFRNONAA 48* 08/04/2015 0405   GFRAA 55* 08/04/2015 0405     Discharge Instructions:   The patient is discharged to home with extensive instructions on wound care and progressive ambulation.  They are instructed not to drive or perform any heavy lifting until returning to see the physician in his office.  Discharge Instructions    Call MD for:  redness, tenderness, or signs of infection (pain, swelling, bleeding, redness, odor or green/yellow discharge around incision site)    Complete by:  As directed      Call MD for:  severe or increased pain, loss or decreased feeling  in affected limb(s)    Complete by:  As directed      Call MD for:  temperature >100.5  Complete by:  As directed      Discharge instructions    Complete by:  As directed   Float heels off the bed so that your heel does not have any pressure on it.     Discharge wound care:    Complete by:  As directed   Wash the groin wound with soap and water daily and pat dry. (No tub bath-only shower)  Then put a dry gauze or washcloth there to keep this area dry daily and as needed.  Do not use Vaseline or neosporin on your incisions.  Only use soap and water on your incisions and then protect and keep dry.     Driving Restrictions    Complete by:  As directed   No  driving for 2 weeks     Lifting restrictions    Complete by:  As directed   No lifting for 4 weeks     Resume previous diet    Complete by:  As directed            Discharge Diagnosis:  Right common femoral artery stenosis I77.1; Right heel ulcer L97.409  Secondary Diagnosis: Patient Active Problem List   Diagnosis Date Noted  . Malnutrition of moderate degree 08/05/2015  . PAD (peripheral artery disease) ([place]) 08/03/2015  . Atherosclerotic PVD with intermittent claudication ([place]) 02/20/2014  . Aftercare following surgery of the circulatory system, [facility] 12/21/2013  . Hypothyroidism   . CAD (coronary artery disease) 09/06/2012  . s/p CABG   . Hyperlipidemia   . Essential hypertension   . Gout   . BPH (benign prostatic hypertrophy)   . 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 Carotid artery disease without cerebral infarction [facility])    Past Medical History  Diagnosis Date  . CAD (coronary artery disease)   . Hyperlipidemia   . Hypertension   . GERD (gastroesophageal reflux disease)   . MI (myocardial infarction) ([place]) 4/14  . Gout   . BPH (benign prostatic hypertrophy)   . Carotid artery disease ([place])     Right carotid stent 2013  . TIA (transient ischemic attack)     Hx: of  . H/O hiatal hernia   . Neuropathy (HCC)     Hx: of in B/L toes  . Stroke ([place])   . AAA (abdominal aortic aneurysm) ([place])     2004, Pt stated not there now  . Peripheral vascular disease ([place])     with claudication  . Anxiety   . Depression   . Pre-diabetes   . Hypothyroidism   . Colon polyps     adenomatous  . Arthritis   . Anginal pain ([place])        Medication List    TAKE these medications        allopurinol 100 MG tablet  Commonly known as:  ZYLOPRIM  Take 200 mg by mouth daily.     aluminum hydroxide-magnesium carbonate 95-358 MG/15ML Susp  Commonly known as:  GAVISCON  Take 15 mLs by mouth.     amLODipine 10 MG  tablet  Commonly known as:  NORVASC  Take 10 mg by mouth daily.     aspirin EC 81 MG tablet  Take 81 mg by mouth 2 (two) times daily.     atenolol 50 MG tablet  Commonly known as:  TENORMIN  Take 50 mg by mouth daily.     cholecalciferol 1000 units tablet  Commonly known as:  VITAMIN  D  Take 1,000 Units by mouth daily.     clopidogrel 75 MG tablet  Commonly known as:  PLAVIX  Take 75 mg by mouth daily.     colchicine 0.6 MG tablet  Take 0.6 mg by mouth 2 (two) times daily.     fish oil-omega-3 fatty acids 1000 MG capsule  Take 1 g by mouth daily.     hydrochlorothiazide 25 MG tablet  Commonly known as:  HYDRODIURIL  Take 25 mg by mouth daily.     hydrochlorothiazide 25 MG tablet  Commonly known as:  HYDRODIURIL  Take 25 mg by mouth daily.     lansoprazole 15 MG capsule  Commonly known as:  PREVACID  Take 15 mg by mouth daily as needed.     levothyroxine 88 MCG tablet  Commonly known as:  SYNTHROID, LEVOTHROID  Take 88 mcg by mouth daily before breakfast.     lisinopril 20 MG tablet  Commonly known as:  PRINIVIL,ZESTRIL  Take 20 mg by mouth daily.     multivitamin Tabs tablet  Take 1 tablet by mouth daily.     oxyCODONE 5 MG immediate release tablet  Commonly known as:  Oxy IR/ROXICODONE  Take 1 tablet (5 mg total) by mouth every 8 (eight) hours as needed for moderate pain.     PARoxetine 20 MG tablet  Commonly known as:  PAXIL  Take 20 mg by mouth every morning.     ramipril 2.5 MG tablet  Commonly known as:  ALTACE  Take 2.5 mg by mouth daily.     rosuvastatin 40 MG tablet  Commonly known as:  CRESTOR  Take 40 mg by mouth daily.     vitamin B-12 1000 MCG tablet  Commonly known as:  CYANOCOBALAMIN  Take 1,000 mcg by mouth daily.        Oxycodone #30 No Refill  Disposition: Home  Patient's condition: is Good  Follow up: 1. Dr. Trula Slade in 2 weeks   Virgina Jock, PA-C [facility] [phone] 08/07/2015  2:20 PM  -  For VQI Registry use --- Instructions: Press F2 to tab through selections.  Delete question if not applicable.   Post-op:  Wound infection: No  Graft infection: No  Transfusion: No   New Arrhythmia: No Ipsilateral amputation: No, [ ]  Minor, [ ]  BKA, [ ]  AKA Discharge patency: [x ] Primary, [ ]  Primary assisted, [ ]  Secondary, [ ]  Occluded Patency judged by: [x ] Dopper only, [ ]  Palpable graft pulse, [ ]  Palpable distal pulse, [ ]  ABI inc. > 0.15, [ ]  Duplex D/C Ambulatory Status: Ambulatory with Assistance  Complications: MI: No, [ ]  Troponin only, [ ]  EKG or Clinical CHF: No Resp failure:No, [ ]  Pneumonia, [ ]  Ventilator Chg in renal function: No, [ ]  Inc. Cr > 0.5, [ ]  Temp. Dialysis, [ ]  Permanent dialysis Stroke: No, [ ]  Minor, [ ]  Major Return to OR: No  Reason for return to OR: [ ]  Bleeding, [ ]  Infection, [ ]  Thrombosis, [ ]  Revision  Discharge medications: Statin use:  yes ASA use:  yes Plavix use:  yes Beta blocker use: yes Coumadin use: no

## 2015-08-08 ENCOUNTER — Encounter: Payer: Self-pay | Admitting: Surgery

## 2015-08-08 DIAGNOSIS — L97419 Non-pressure chronic ulcer of right heel and midfoot with unspecified severity: Secondary | ICD-10-CM | POA: Diagnosis not present

## 2015-08-08 DIAGNOSIS — I70244 Atherosclerosis of native arteries of left leg with ulceration of heel and midfoot: Secondary | ICD-10-CM | POA: Diagnosis not present

## 2015-08-08 DIAGNOSIS — I2510 Atherosclerotic heart disease of native coronary artery without angina pectoris: Secondary | ICD-10-CM | POA: Diagnosis not present

## 2015-08-08 DIAGNOSIS — I714 Abdominal aortic aneurysm, without rupture: Secondary | ICD-10-CM | POA: Diagnosis not present

## 2015-08-08 DIAGNOSIS — I10 Essential (primary) hypertension: Secondary | ICD-10-CM | POA: Diagnosis not present

## 2015-08-08 DIAGNOSIS — M109 Gout, unspecified: Secondary | ICD-10-CM | POA: Diagnosis not present

## 2015-08-08 DIAGNOSIS — G629 Polyneuropathy, unspecified: Secondary | ICD-10-CM | POA: Diagnosis not present

## 2015-08-08 DIAGNOSIS — Z48812 Encounter for surgical aftercare following surgery on the circulatory system: Secondary | ICD-10-CM | POA: Diagnosis not present

## 2015-08-08 DIAGNOSIS — I739 Peripheral vascular disease, unspecified: Secondary | ICD-10-CM | POA: Diagnosis not present

## 2015-08-10 DIAGNOSIS — L97419 Non-pressure chronic ulcer of right heel and midfoot with unspecified severity: Secondary | ICD-10-CM | POA: Diagnosis not present

## 2015-08-10 DIAGNOSIS — Z48812 Encounter for surgical aftercare following surgery on the circulatory system: Secondary | ICD-10-CM | POA: Diagnosis not present

## 2015-08-10 DIAGNOSIS — I714 Abdominal aortic aneurysm, without rupture: Secondary | ICD-10-CM | POA: Diagnosis not present

## 2015-08-10 DIAGNOSIS — I739 Peripheral vascular disease, unspecified: Secondary | ICD-10-CM | POA: Diagnosis not present

## 2015-08-10 DIAGNOSIS — I70244 Atherosclerosis of native arteries of left leg with ulceration of heel and midfoot: Secondary | ICD-10-CM | POA: Diagnosis not present

## 2015-08-10 DIAGNOSIS — I2510 Atherosclerotic heart disease of native coronary artery without angina pectoris: Secondary | ICD-10-CM | POA: Diagnosis not present

## 2015-08-10 DIAGNOSIS — M109 Gout, unspecified: Secondary | ICD-10-CM | POA: Diagnosis not present

## 2015-08-10 DIAGNOSIS — G629 Polyneuropathy, unspecified: Secondary | ICD-10-CM | POA: Diagnosis not present

## 2015-08-10 DIAGNOSIS — I10 Essential (primary) hypertension: Secondary | ICD-10-CM | POA: Diagnosis not present

## 2015-08-13 ENCOUNTER — Ambulatory Visit (INDEPENDENT_AMBULATORY_CARE_PROVIDER_SITE_OTHER): Payer: Commercial Managed Care - HMO | Admitting: Surgery

## 2015-08-13 ENCOUNTER — Encounter: Payer: Self-pay | Admitting: Surgery

## 2015-08-13 ENCOUNTER — Telehealth: Payer: Self-pay | Admitting: Surgery

## 2015-08-13 VITALS — BP 91/50 | HR 54 | Temp 96.9°F | Ht 70.0 in | Wt 155.0 lb

## 2015-08-13 DIAGNOSIS — I7025 Atherosclerosis of native arteries of other extremities with ulceration: Secondary | ICD-10-CM

## 2015-08-13 NOTE — Progress Notes (Signed)
Patient name: Ian Cummings MRN: [id number] DOB: 11-06-1928 Sex: male     Chief Complaint  Patient presents with  . Routine Post Op    2wk f/u - s/p R fem pop BP and fem endarterectomy - c/o dizziness when standing from sitting position since surgery    HISTORY OF PRESENT ILLNESS: The patient is back for follow-up.  On 08/03/2015 he underwent right external iliac, common femoral, and profunda femoral endarterectomy with bovine pericardial patch angioplasty, followed by a right common femoral to below knee popliteal artery bypass graft with 6 mm external ring propatent PTFE.  This was done for a right heel ulcer and a right great toe ulcer.  He is back today doing well.  He feels that the wounds are getting slightly smaller.  He has mild edema in the leg.  He does complain that he gets dizzy when he stands up and is almost passed out several times.  His activity has been limited.  Past Medical History  Diagnosis Date  . CAD (coronary artery disease)   . Hyperlipidemia   . Hypertension   . GERD (gastroesophageal reflux disease)   . MI (myocardial infarction) ([place]) 4/14  . Gout   . BPH (benign prostatic hypertrophy)   . Carotid artery disease ([place])     Right carotid stent 2013  . TIA (transient ischemic attack)     Hx: of  . H/O hiatal hernia   . Neuropathy (H[facility])     Hx: of in B/L toes  . Stroke ([place])   . AAA (abdominal aortic aneurysm) ([place])     2004, Pt stated not there now  . Peripheral vascular disease ([place])     with claudication  . Anxiety   . Depression   . Pre-diabetes   . Hypothyroidism   . Colon polyps     adenomatous  . Arthritis   . Anginal pain [facility])     Past Surgical History  Procedure Laterality Date  . Coronary artery bypass graft  1995  . Foot surgery Left   . Coronary angioplasty with stent placement  2010  . Inguinal hernia repair Bilateral     X 2   . Carotid stent insertion Right 2013    right at [facility]  . Tonsillectomy    .  Colonoscopy      Hx: of  . Retinal detachment surgery Left     Hx: of left eye  . Cataract extraction Bilateral     Hx: of both eyes  . Fracture surgery Left     Hx: of Left heel  . Coronary artery bypass graft N/A 10/18/2012    Procedure: REDO CORONARY ARTERY BYPASS GRAFTING (CABG);  Surgeon: Grace Isaac, MD;  Location: [place];  Service: Open Heart Surgery;  Laterality: N/A;  off pump times two using endoscopically harvested left saphenous vein  . Intraoperative transesophageal echocardiogram N/A 10/18/2012    Procedure: INTRAOPERATIVE TRANSESOPHAGEAL ECHOCARDIOGRAM;  Surgeon: Grace Isaac, MD;  Location: [place];  Service: Open Heart Surgery;  Laterality: N/A;  . Mastoidectomy  1933  . Left heart catheterization with coronary/graft angiogram N/A 02/02/2013    Procedure: LEFT HEART CATHETERIZATION WITH Beatrix Fetters;  Surgeon: Jacolyn Reedy, MD;  Location: [facility] CATH LAB;  Service: Cardiovascular;  Laterality: N/A;  . Peripheral vascular catheterization N/A 01/23/2015    Procedure: Abdominal Aortogram;  Surgeon: Serafina Mitchell, MD;  Location: [place] CV LAB;  Service: Cardiovascular;  Laterality: N/A;  .  Peripheral vascular catheterization N/A 06/27/2015    Procedure: Abdominal Aortogram;  Surgeon: Serafina Mitchell, MD;  Location: [place] CV LAB;  Service: Cardiovascular;  Laterality: N/A;  . Lower extremity angiogram Right 06/27/2015    Procedure: Lower Extremity Angiogram;  Surgeon: Serafina Mitchell, MD;  Location: [place] CV LAB;  Service: Cardiovascular;  Laterality: Right;  . Colonoscopy    . Endarterectomy femoral Right 08/03/2015    Procedure: RIGHT FEMORAL ENDARTERECTOMY WITH PATCH ANGIOPLASTY;  Surgeon: Serafina Mitchell, MD;  Location: [place];  Service: Vascular;  Laterality: Right;  . Femoral-popliteal bypass graft Right 08/03/2015    Procedure: RIGHT FEMORAL-BELOW KNEE POPLITEAL ARTERY BYPASS GRAFT;  Surgeon: Serafina Mitchell, MD;  Location: [facility] OR;  Service:  Vascular;  Laterality: Right;    Social History   Social History  . Marital Status: Married    Spouse Name: N/A  . Number of Children: 3  . Years of Education: N/A   Occupational History  . retired    Social History Main Topics  . Smoking status: Former Smoker    Types: Cigarettes    Quit date: 04/29/1975  . Smokeless tobacco: Never Used  . Alcohol Use: No     Comment: Occasional drink  . Drug Use: No  . Sexual Activity: Not Currently   Other Topics Concern  . 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 Heart disease Mother     After 42 yrs of age  . Heart attack Mother   . Hyperlipidemia Father   . Hypertension Father   . Heart disease Father     After 11 yrs of age  . Diabetes Father   . Stroke Sister   . Colon cancer Neg Hx   . Stomach cancer Neg Hx   . Rectal cancer Neg Hx   . Esophageal cancer Neg Hx   . Liver disease Neg Hx   . Kidney disease Neg Hx     Allergies as of 08/13/2015 - Review Complete 08/13/2015  Allergen Reaction Noted  . Metoprolol Itching 12/20/2012    Current Outpatient Prescriptions on File Prior to Visit  Medication Sig Dispense Refill  . allopurinol (ZYLOPRIM) 100 MG tablet Take 200 mg by mouth daily.    Marland Kitchen aluminum hydroxide-magnesium carbonate (GAVISCON) 95-358 MG/15ML SUSP Take 15 mLs by mouth.    Marland Kitchen amLODipine (NORVASC) 10 MG tablet Take 10 mg by mouth daily.    Marland Kitchen aspirin EC 81 MG tablet Take 81 mg by mouth 2 (two) times daily.     Marland Kitchen atenolol (TENORMIN) 50 MG tablet Take 50 mg by mouth daily.    . cholecalciferol (VITAMIN D) 1000 UNITS tablet Take 1,000 Units by mouth daily.     . clopidogrel (PLAVIX) 75 MG tablet Take 75 mg by mouth daily.    . colchicine 0.6 MG tablet Take 0.6 mg by mouth 2 (two) times daily.    . fish oil-omega-3 fatty acids 1000 MG capsule Take 1 g by mouth daily.     . hydrochlorothiazide (HYDRODIURIL) 25 MG tablet Take 25  mg by mouth daily.    . hydrochlorothiazide (HYDRODIURIL) 25 MG tablet Take 25 mg by mouth daily.    . lansoprazole (PREVACID) 15 MG capsule Take 15 mg by mouth daily as needed.     Marland Kitchen levothyroxine (SYNTHROID, LEVOTHROID) 88 MCG tablet Take 88 mcg by mouth daily before  breakfast.    . lisinopril (PRINIVIL,ZESTRIL) 20 MG tablet Take 20 mg by mouth daily.    . multivitamin (RENA-VIT) TABS tablet Take 1 tablet by mouth daily.    Marland Kitchen oxyCODONE (OXY IR/ROXICODONE) 5 MG immediate release tablet Take 1 tablet (5 mg total) by mouth every 8 (eight) hours as needed for moderate pain. 30 tablet 0  . PARoxetine (PAXIL) 20 MG tablet Take 20 mg by mouth every morning.    . ramipril (ALTACE) 2.5 MG tablet Take 2.5 mg by mouth daily.     . rosuvastatin (CRESTOR) 40 MG tablet Take 40 mg by mouth daily.    . vitamin B-12 (CYANOCOBALAMIN) 1000 MCG tablet Take 1,000 mcg by mouth daily.     No current facility-administered medications on file prior to visit.       PHYSICAL EXAMINATION:   Vital signs are  Filed Vitals:   08/13/15 1437  BP: 91/50  Pulse: 54  Temp: 96.9 F (36.1 C)  TempSrc: Oral  Height: 5\' 10"  (1.778 m)  Weight: 155 lb (70.308 kg)  SpO2: 100%   Body mass index is 22.24 kg/(m^2). General: The patient appears their stated age. Incisions are healing nicely Palpable dorsalis pedis pulse on the right Mild edema  Diagnostic Studies None  Assessment: Status post right femoral-popliteal bypass graft Plan: From my perspective the patient is doing well following his operation.  I will have him follow-up in 3 months with ABI and duplex.  He has a palpable pulse today  The patient does complain of near syncope.  I suspect this is just orthostasis, I have instructed him how to take his time standing up.  I'm also going to have him see Dr. Carolan Clines, his primary care physician for follow-up.  Eldridge Abrahams, M.D. [facility] [place] Office: [phone] Pager:   [phone]

## 2015-08-13 NOTE — Telephone Encounter (Signed)
Spoke with pt's wife - appt with Dr. Lang Snow [facility] 08/16/15 Thurs 8:30 am. Wife verbalized understanding.

## 2015-08-14 DIAGNOSIS — I70244 Atherosclerosis of native arteries of left leg with ulceration of heel and midfoot: Secondary | ICD-10-CM | POA: Diagnosis not present

## 2015-08-14 DIAGNOSIS — Z48812 Encounter for surgical aftercare following surgery on the circulatory system: Secondary | ICD-10-CM | POA: Diagnosis not present

## 2015-08-14 DIAGNOSIS — I739 Peripheral vascular disease, unspecified: Secondary | ICD-10-CM | POA: Diagnosis not present

## 2015-08-14 DIAGNOSIS — I10 Essential (primary) hypertension: Secondary | ICD-10-CM | POA: Diagnosis not present

## 2015-08-14 DIAGNOSIS — I2510 Atherosclerotic heart disease of native coronary artery without angina pectoris: Secondary | ICD-10-CM | POA: Diagnosis not present

## 2015-08-14 DIAGNOSIS — G629 Polyneuropathy, unspecified: Secondary | ICD-10-CM | POA: Diagnosis not present

## 2015-08-14 DIAGNOSIS — M109 Gout, unspecified: Secondary | ICD-10-CM | POA: Diagnosis not present

## 2015-08-14 DIAGNOSIS — L97419 Non-pressure chronic ulcer of right heel and midfoot with unspecified severity: Secondary | ICD-10-CM | POA: Diagnosis not present

## 2015-08-14 DIAGNOSIS — I714 Abdominal aortic aneurysm, without rupture: Secondary | ICD-10-CM | POA: Diagnosis not present

## 2015-08-16 DIAGNOSIS — I951 Orthostatic hypotension: Secondary | ICD-10-CM | POA: Diagnosis not present

## 2015-08-16 DIAGNOSIS — I7389 Other specified peripheral vascular diseases: Secondary | ICD-10-CM | POA: Diagnosis not present

## 2015-08-16 DIAGNOSIS — L97419 Non-pressure chronic ulcer of right heel and midfoot with unspecified severity: Secondary | ICD-10-CM | POA: Diagnosis not present

## 2015-08-16 DIAGNOSIS — Z6821 Body mass index (BMI) 21.0-21.9, adult: Secondary | ICD-10-CM | POA: Diagnosis not present

## 2015-08-16 DIAGNOSIS — I10 Essential (primary) hypertension: Secondary | ICD-10-CM | POA: Diagnosis not present

## 2015-08-17 DIAGNOSIS — I714 Abdominal aortic aneurysm, without rupture: Secondary | ICD-10-CM | POA: Diagnosis not present

## 2015-08-17 DIAGNOSIS — G629 Polyneuropathy, unspecified: Secondary | ICD-10-CM | POA: Diagnosis not present

## 2015-08-17 DIAGNOSIS — I10 Essential (primary) hypertension: Secondary | ICD-10-CM | POA: Diagnosis not present

## 2015-08-17 DIAGNOSIS — I2510 Atherosclerotic heart disease of native coronary artery without angina pectoris: Secondary | ICD-10-CM | POA: Diagnosis not present

## 2015-08-17 DIAGNOSIS — I739 Peripheral vascular disease, unspecified: Secondary | ICD-10-CM | POA: Diagnosis not present

## 2015-08-17 DIAGNOSIS — Z48812 Encounter for surgical aftercare following surgery on the circulatory system: Secondary | ICD-10-CM | POA: Diagnosis not present

## 2015-08-17 DIAGNOSIS — M109 Gout, unspecified: Secondary | ICD-10-CM | POA: Diagnosis not present

## 2015-08-17 DIAGNOSIS — I70244 Atherosclerosis of native arteries of left leg with ulceration of heel and midfoot: Secondary | ICD-10-CM | POA: Diagnosis not present

## 2015-08-17 DIAGNOSIS — L97419 Non-pressure chronic ulcer of right heel and midfoot with unspecified severity: Secondary | ICD-10-CM | POA: Diagnosis not present

## 2015-08-20 DIAGNOSIS — I70244 Atherosclerosis of native arteries of left leg with ulceration of heel and midfoot: Secondary | ICD-10-CM | POA: Diagnosis not present

## 2015-08-20 DIAGNOSIS — G629 Polyneuropathy, unspecified: Secondary | ICD-10-CM | POA: Diagnosis not present

## 2015-08-20 DIAGNOSIS — I10 Essential (primary) hypertension: Secondary | ICD-10-CM | POA: Diagnosis not present

## 2015-08-20 DIAGNOSIS — I739 Peripheral vascular disease, unspecified: Secondary | ICD-10-CM | POA: Diagnosis not present

## 2015-08-20 DIAGNOSIS — I714 Abdominal aortic aneurysm, without rupture: Secondary | ICD-10-CM | POA: Diagnosis not present

## 2015-08-20 DIAGNOSIS — I2510 Atherosclerotic heart disease of native coronary artery without angina pectoris: Secondary | ICD-10-CM | POA: Diagnosis not present

## 2015-08-20 DIAGNOSIS — Z48812 Encounter for surgical aftercare following surgery on the circulatory system: Secondary | ICD-10-CM | POA: Diagnosis not present

## 2015-08-20 DIAGNOSIS — M109 Gout, unspecified: Secondary | ICD-10-CM | POA: Diagnosis not present

## 2015-08-20 DIAGNOSIS — L97419 Non-pressure chronic ulcer of right heel and midfoot with unspecified severity: Secondary | ICD-10-CM | POA: Diagnosis not present

## 2015-08-23 DIAGNOSIS — I2510 Atherosclerotic heart disease of native coronary artery without angina pectoris: Secondary | ICD-10-CM | POA: Diagnosis not present

## 2015-08-23 DIAGNOSIS — I739 Peripheral vascular disease, unspecified: Secondary | ICD-10-CM | POA: Diagnosis not present

## 2015-08-23 DIAGNOSIS — Z48812 Encounter for surgical aftercare following surgery on the circulatory system: Secondary | ICD-10-CM | POA: Diagnosis not present

## 2015-08-23 DIAGNOSIS — I714 Abdominal aortic aneurysm, without rupture: Secondary | ICD-10-CM | POA: Diagnosis not present

## 2015-08-23 DIAGNOSIS — M109 Gout, unspecified: Secondary | ICD-10-CM | POA: Diagnosis not present

## 2015-08-23 DIAGNOSIS — G629 Polyneuropathy, unspecified: Secondary | ICD-10-CM | POA: Diagnosis not present

## 2015-08-23 DIAGNOSIS — I70244 Atherosclerosis of native arteries of left leg with ulceration of heel and midfoot: Secondary | ICD-10-CM | POA: Diagnosis not present

## 2015-08-23 DIAGNOSIS — L97419 Non-pressure chronic ulcer of right heel and midfoot with unspecified severity: Secondary | ICD-10-CM | POA: Diagnosis not present

## 2015-08-23 DIAGNOSIS — I10 Essential (primary) hypertension: Secondary | ICD-10-CM | POA: Diagnosis not present

## 2015-09-21 ENCOUNTER — Other Ambulatory Visit: Payer: Self-pay | Admitting: *Deleted

## 2015-09-21 DIAGNOSIS — I739 Peripheral vascular disease, unspecified: Secondary | ICD-10-CM

## 2015-09-26 DIAGNOSIS — M7061 Trochanteric bursitis, right hip: Secondary | ICD-10-CM | POA: Diagnosis not present

## 2015-10-05 DIAGNOSIS — I70234 Atherosclerosis of native arteries of right leg with ulceration of heel and midfoot: Secondary | ICD-10-CM | POA: Diagnosis not present

## 2015-10-05 DIAGNOSIS — I25719 Atherosclerosis of autologous vein coronary artery bypass graft(s) with unspecified angina pectoris: Secondary | ICD-10-CM | POA: Diagnosis not present

## 2015-10-05 DIAGNOSIS — Z951 Presence of aortocoronary bypass graft: Secondary | ICD-10-CM | POA: Diagnosis not present

## 2015-10-05 DIAGNOSIS — I6523 Occlusion and stenosis of bilateral carotid arteries: Secondary | ICD-10-CM | POA: Diagnosis not present

## 2015-10-05 DIAGNOSIS — E785 Hyperlipidemia, unspecified: Secondary | ICD-10-CM | POA: Diagnosis not present

## 2015-10-05 DIAGNOSIS — I2510 Atherosclerotic heart disease of native coronary artery without angina pectoris: Secondary | ICD-10-CM | POA: Diagnosis not present

## 2015-11-05 DIAGNOSIS — I129 Hypertensive chronic kidney disease with stage 1 through stage 4 chronic kidney disease, or unspecified chronic kidney disease: Secondary | ICD-10-CM | POA: Diagnosis not present

## 2015-11-05 DIAGNOSIS — E038 Other specified hypothyroidism: Secondary | ICD-10-CM | POA: Diagnosis not present

## 2015-11-05 DIAGNOSIS — I951 Orthostatic hypotension: Secondary | ICD-10-CM | POA: Diagnosis not present

## 2015-11-05 DIAGNOSIS — R531 Weakness: Secondary | ICD-10-CM | POA: Diagnosis not present

## 2015-11-05 DIAGNOSIS — R5383 Other fatigue: Secondary | ICD-10-CM | POA: Diagnosis not present

## 2015-11-05 DIAGNOSIS — N182 Chronic kidney disease, stage 2 (mild): Secondary | ICD-10-CM | POA: Diagnosis not present

## 2015-11-05 DIAGNOSIS — I10 Essential (primary) hypertension: Secondary | ICD-10-CM | POA: Diagnosis not present

## 2015-12-11 ENCOUNTER — Encounter: Payer: Self-pay | Admitting: Surgery

## 2015-12-14 ENCOUNTER — Ambulatory Visit (INDEPENDENT_AMBULATORY_CARE_PROVIDER_SITE_OTHER)
Admission: RE | Admit: 2015-12-14 | Discharge: 2015-12-14 | Disposition: A | Discharge status: Home / Self Care | Payer: Commercial Managed Care - HMO | Source: Ambulatory Visit | Attending: Surgery | Admitting: Surgery

## 2015-12-14 ENCOUNTER — Ambulatory Visit (HOSPITAL_COMMUNITY)
Admission: RE | Admit: 2015-12-14 | Discharge: 2015-12-14 | Disposition: A | Discharge status: Home / Self Care | Payer: Commercial Managed Care - HMO | Source: Ambulatory Visit | Attending: Surgery | Admitting: Surgery

## 2015-12-14 DIAGNOSIS — R938 Abnormal findings on diagnostic imaging of other specified body structures: Secondary | ICD-10-CM | POA: Diagnosis not present

## 2015-12-14 DIAGNOSIS — I739 Peripheral vascular disease, unspecified: Secondary | ICD-10-CM

## 2015-12-14 DIAGNOSIS — E785 Hyperlipidemia, unspecified: Secondary | ICD-10-CM | POA: Insufficient documentation

## 2015-12-14 DIAGNOSIS — K219 Gastro-esophageal reflux disease without esophagitis: Secondary | ICD-10-CM | POA: Diagnosis not present

## 2015-12-14 DIAGNOSIS — E039 Hypothyroidism, unspecified: Secondary | ICD-10-CM | POA: Diagnosis not present

## 2015-12-14 DIAGNOSIS — R0989 Other specified symptoms and signs involving the circulatory and respiratory systems: Secondary | ICD-10-CM | POA: Diagnosis present

## 2015-12-14 DIAGNOSIS — I10 Essential (primary) hypertension: Secondary | ICD-10-CM | POA: Insufficient documentation

## 2015-12-14 DIAGNOSIS — I2510 Atherosclerotic heart disease of native coronary artery without angina pectoris: Secondary | ICD-10-CM | POA: Insufficient documentation

## 2015-12-14 DIAGNOSIS — F419 Anxiety disorder, unspecified: Secondary | ICD-10-CM | POA: Insufficient documentation

## 2015-12-14 DIAGNOSIS — F329 Major depressive disorder, single episode, unspecified: Secondary | ICD-10-CM | POA: Insufficient documentation

## 2015-12-17 ENCOUNTER — Ambulatory Visit (INDEPENDENT_AMBULATORY_CARE_PROVIDER_SITE_OTHER): Payer: Commercial Managed Care - HMO | Admitting: Surgery

## 2015-12-17 ENCOUNTER — Encounter: Payer: Self-pay | Admitting: Surgery

## 2015-12-17 VITALS — BP 142/66 | HR 50 | Temp 97.6°F | Resp 16 | Ht 70.0 in | Wt 166.0 lb

## 2015-12-17 DIAGNOSIS — I7025 Atherosclerosis of native arteries of other extremities with ulceration: Secondary | ICD-10-CM | POA: Diagnosis not present

## 2015-12-17 NOTE — Progress Notes (Signed)
Vascular and Vein Specialist of [place]  Patient name: Ian Cummings MRN: [id number] DOB: January 01, 1929 Sex: male  REASON FOR VISIT: follow up  HPI: The patient is back for follow-up.  On 08/03/2015 he underwent right external iliac, common femoral, and profunda femoral endarterectomy with bovine pericardial patch angioplasty, followed by a right common femoral to below knee popliteal artery bypass graft with 6 mm external ring propatent PTFE.  This was done for a right heel ulcer and a right great toe ulcer.   Past Medical History:  Diagnosis Date  . AAA (abdominal aortic aneurysm) ([place])    2004, Pt stated not there now  . Anginal pain ([place])   . Anxiety   . Arthritis   . BPH (benign prostatic hypertrophy)   . CAD (coronary artery disease)   . Carotid artery disease ([place])    Right carotid stent 2013  . Colon polyps    adenomatous  . Depression   . GERD (gastroesophageal reflux disease)   . Gout   . H/O hiatal hernia   . Hyperlipidemia   . Hypertension   . Hypothyroidism   . MI (myocardial infarction) ([place]) 4/14  . Neuropathy (HCC)    Hx: of in B/L toes  . Peripheral vascular disease ([place])    with claudication  . Pre-diabetes   . Stroke ([place])   . TIA (transient ischemic attack)    Hx: of    Family History  Problem Relation Age of Onset  . Heart disease Mother     After 79 yrs of age  . Heart attack Mother   . Hyperlipidemia Father   . Hypertension Father   . Heart disease Father     After 57 yrs of age  . Diabetes Father   . Stroke Sister   . Colon cancer Neg Hx   . Stomach cancer Neg Hx   . Rectal cancer Neg Hx   . Esophageal cancer Neg Hx   . Liver disease Neg Hx   . Kidney disease Neg Hx     SOCIAL HISTORY: Social History  Substance Use Topics  . Smoking status: Former Smoker    Types: Cigarettes    Quit date: 04/29/1975  . Smokeless tobacco: Never Used  . Alcohol use No     Comment: Occasional drink     Allergies  Allergen Reactions  . Metoprolol Itching    Current Outpatient Prescriptions  Medication Sig Dispense Refill  . allopurinol (ZYLOPRIM) 100 MG tablet Take 200 mg by mouth daily.    Marland Kitchen aluminum hydroxide-magnesium carbonate (GAVISCON) 95-358 MG/15ML SUSP Take 15 mLs by mouth.    Marland Kitchen amLODipine (NORVASC) 10 MG tablet Take 10 mg by mouth daily.    Marland Kitchen aspirin EC 81 MG tablet Take 81 mg by mouth 2 (two) times daily.     Marland Kitchen atenolol (TENORMIN) 50 MG tablet Take 50 mg by mouth daily.    . clopidogrel (PLAVIX) 75 MG tablet Take 75 mg by mouth daily.    . fish oil-omega-3 fatty acids 1000 MG capsule Take 1 g by mouth daily.     . hydrochlorothiazide (HYDRODIURIL) 25 MG tablet Take 25 mg by mouth daily.    . lansoprazole (PREVACID) 15 MG capsule Take 15 mg by mouth daily as needed.     Marland Kitchen levothyroxine (SYNTHROID, LEVOTHROID) 88 MCG tablet Take 88 mcg by mouth daily before breakfast.    . lisinopril (PRINIVIL,ZESTRIL) 20 MG tablet Take 20 mg by mouth daily.    Marland Kitchen  multivitamin (RENA-VIT) TABS tablet Take 1 tablet by mouth daily.    Marland Kitchen oxyCODONE (OXY IR/ROXICODONE) 5 MG immediate release tablet Take 1 tablet (5 mg total) by mouth every 8 (eight) hours as needed for moderate pain. 30 tablet 0  . PARoxetine (PAXIL) 20 MG tablet Take 20 mg by mouth every morning.    . rosuvastatin (CRESTOR) 40 MG tablet Take 40 mg by mouth daily.    . vitamin B-12 (CYANOCOBALAMIN) 1000 MCG tablet Take 1,000 mcg by mouth daily.    . cholecalciferol (VITAMIN D) 1000 UNITS tablet Take 1,000 Units by mouth daily.     . colchicine 0.6 MG tablet Take 0.6 mg by mouth 2 (two) times daily.    . hydrochlorothiazide (HYDRODIURIL) 25 MG tablet Take 25 mg by mouth daily.    . ramipril (ALTACE) 2.5 MG tablet Take 2.5 mg by mouth daily.      No current facility-administered medications for this visit.     REVIEW OF SYSTEMS:  [X]  denotes positive finding, [ ]  denotes negative finding Cardiac  Comments:  Chest pain or  chest pressure:    Shortness of breath upon exertion:    Short of breath when lying flat:    Irregular heart rhythm:        Vascular    Pain in calf, thigh, or hip brought on by ambulation: x   Pain in feet at night that wakes you up from your sleep:     Blood clot in your veins:    Leg swelling:  x       Pulmonary    Oxygen at home:    Productive cough:     Wheezing:         Neurologic    Sudden weakness in arms or legs:     Sudden numbness in arms or legs:  x   Sudden onset of difficulty speaking or slurred speech:    Temporary loss of vision in one eye:     Problems with dizziness:         Gastrointestinal    Blood in stool:     Vomited blood:         Genitourinary    Burning when urinating:     Blood in urine:        Psychiatric    Major depression:         Hematologic    Bleeding problems:    Problems with blood clotting too easily:        Skin    Rashes or ulcers:        Constitutional    Fever or chills:      PHYSICAL EXAM: Vitals:   12/17/15 1122 12/17/15 1124  BP: (!) 152/67 (!) 142/66  Pulse: (!) 50   Resp: 16   Temp: 97.6 F (36.4 C)   TempSrc: Oral   SpO2: 95%   Weight: 166 lb (75.3 kg)   Height: 5\' 10"  (1.778 m)     GENERAL: The patient is a well-nourished male, in no acute distress. The vital signs are documented above. CARDIAC: There is a regular rate and rhythm.  VASCULAR: Palpable dorsalis pedis pulse on the right PULMONARY: There is good air exchange bilaterally without wheezing or rales. MUSCULOSKELETAL: There are no major deformities or cyanosis. NEUROLOGIC: No focal weakness or paresthesias are detected. SKIN: There are no ulcers or rashes noted. PSYCHIATRIC: The patient has a normal affect.  DATA:  I have ordered and reviewed his vascular lab  studies.  The duplex shows a widely patent right femoral popliteal bypass graft with no evidence of stenosis.  Today's ABI 0.98 (Pre-Op was 0.23)  MEDICAL ISSUES: Status post right  femoral-popliteal bypass graft for ulcer: The patient has done very well.  His wounds have all healed.  He has a palpable pulse within his bypass graft.  Ultrasound today shows a widely patent bypass graft.  I have scheduled the patient to follow-up in 9 months for surveillance imaging of his bypass.  I discussed contacting me if he develops recurrent symptoms in the right leg or new symptoms in the left.    Annamarie Major, MD Vascular and Vein Specialists of [facility] [phone] Pager [phone]

## 2015-12-20 ENCOUNTER — Other Ambulatory Visit: Payer: Self-pay | Admitting: *Deleted

## 2015-12-20 DIAGNOSIS — I739 Peripheral vascular disease, unspecified: Secondary | ICD-10-CM

## 2015-12-28 DIAGNOSIS — R7301 Impaired fasting glucose: Secondary | ICD-10-CM | POA: Diagnosis not present

## 2015-12-28 DIAGNOSIS — N390 Urinary tract infection, site not specified: Secondary | ICD-10-CM | POA: Diagnosis not present

## 2015-12-28 DIAGNOSIS — E784 Other hyperlipidemia: Secondary | ICD-10-CM | POA: Diagnosis not present

## 2015-12-28 DIAGNOSIS — E039 Hypothyroidism, unspecified: Secondary | ICD-10-CM | POA: Diagnosis not present

## 2015-12-28 DIAGNOSIS — M109 Gout, unspecified: Secondary | ICD-10-CM | POA: Diagnosis not present

## 2015-12-28 DIAGNOSIS — I10 Essential (primary) hypertension: Secondary | ICD-10-CM | POA: Diagnosis not present

## 2015-12-28 DIAGNOSIS — R8299 Other abnormal findings in urine: Secondary | ICD-10-CM | POA: Diagnosis not present

## 2016-01-03 DIAGNOSIS — Z955 Presence of coronary angioplasty implant and graft: Secondary | ICD-10-CM | POA: Diagnosis not present

## 2016-01-03 DIAGNOSIS — I10 Essential (primary) hypertension: Secondary | ICD-10-CM | POA: Diagnosis not present

## 2016-01-03 DIAGNOSIS — E784 Other hyperlipidemia: Secondary | ICD-10-CM | POA: Diagnosis not present

## 2016-01-03 DIAGNOSIS — I6529 Occlusion and stenosis of unspecified carotid artery: Secondary | ICD-10-CM | POA: Diagnosis not present

## 2016-01-03 DIAGNOSIS — I2589 Other forms of chronic ischemic heart disease: Secondary | ICD-10-CM | POA: Diagnosis not present

## 2016-01-03 DIAGNOSIS — I951 Orthostatic hypotension: Secondary | ICD-10-CM | POA: Diagnosis not present

## 2016-01-03 DIAGNOSIS — E038 Other specified hypothyroidism: Secondary | ICD-10-CM | POA: Diagnosis not present

## 2016-01-03 DIAGNOSIS — Z0000 Encounter for general adult medical examination without abnormal findings: Secondary | ICD-10-CM | POA: Diagnosis not present

## 2016-01-03 DIAGNOSIS — I129 Hypertensive chronic kidney disease with stage 1 through stage 4 chronic kidney disease, or unspecified chronic kidney disease: Secondary | ICD-10-CM | POA: Diagnosis not present

## 2016-01-23 DIAGNOSIS — I10 Essential (primary) hypertension: Secondary | ICD-10-CM | POA: Diagnosis not present

## 2016-01-23 DIAGNOSIS — Z951 Presence of aortocoronary bypass graft: Secondary | ICD-10-CM | POA: Diagnosis not present

## 2016-01-23 DIAGNOSIS — T360X5A Adverse effect of penicillins, initial encounter: Secondary | ICD-10-CM | POA: Diagnosis not present

## 2016-01-23 DIAGNOSIS — T368X5A Adverse effect of other systemic antibiotics, initial encounter: Secondary | ICD-10-CM | POA: Diagnosis not present

## 2016-01-23 DIAGNOSIS — Z8673 Personal history of transient ischemic attack (TIA), and cerebral infarction without residual deficits: Secondary | ICD-10-CM | POA: Diagnosis not present

## 2016-01-23 DIAGNOSIS — R220 Localized swelling, mass and lump, head: Secondary | ICD-10-CM | POA: Diagnosis not present

## 2016-01-23 DIAGNOSIS — Z79899 Other long term (current) drug therapy: Secondary | ICD-10-CM | POA: Diagnosis not present

## 2016-01-23 DIAGNOSIS — Z7902 Long term (current) use of antithrombotics/antiplatelets: Secondary | ICD-10-CM | POA: Diagnosis not present

## 2016-01-23 DIAGNOSIS — I2510 Atherosclerotic heart disease of native coronary artery without angina pectoris: Secondary | ICD-10-CM | POA: Diagnosis not present

## 2016-02-01 DIAGNOSIS — L821 Other seborrheic keratosis: Secondary | ICD-10-CM | POA: Diagnosis not present

## 2016-02-01 DIAGNOSIS — L309 Dermatitis, unspecified: Secondary | ICD-10-CM | POA: Diagnosis not present

## 2016-02-01 DIAGNOSIS — L578 Other skin changes due to chronic exposure to nonionizing radiation: Secondary | ICD-10-CM | POA: Diagnosis not present

## 2016-02-01 DIAGNOSIS — L812 Freckles: Secondary | ICD-10-CM | POA: Diagnosis not present

## 2016-02-01 DIAGNOSIS — L570 Actinic keratosis: Secondary | ICD-10-CM | POA: Diagnosis not present

## 2016-02-01 DIAGNOSIS — D1801 Hemangioma of skin and subcutaneous tissue: Secondary | ICD-10-CM | POA: Diagnosis not present

## 2016-02-07 ENCOUNTER — Encounter: Payer: Self-pay | Admitting: Surgery

## 2016-02-11 ENCOUNTER — Encounter (HOSPITAL_COMMUNITY): Payer: Self-pay

## 2016-02-11 ENCOUNTER — Ambulatory Visit (HOSPITAL_COMMUNITY)
Admission: RE | Admit: 2016-02-11 | Discharge: 2016-02-11 | Disposition: A | Discharge status: Home / Self Care | Payer: Commercial Managed Care - HMO | Source: Ambulatory Visit | Attending: Surgery | Admitting: Surgery

## 2016-02-11 ENCOUNTER — Ambulatory Visit (INDEPENDENT_AMBULATORY_CARE_PROVIDER_SITE_OTHER): Payer: Commercial Managed Care - HMO | Admitting: Surgery

## 2016-02-11 ENCOUNTER — Encounter: Payer: Self-pay | Admitting: Surgery

## 2016-02-11 VITALS — BP 140/58 | HR 52 | Temp 97.2°F | Resp 16 | Ht 70.5 in | Wt 165.0 lb

## 2016-02-11 DIAGNOSIS — I70219 Atherosclerosis of native arteries of extremities with intermittent claudication, unspecified extremity: Secondary | ICD-10-CM | POA: Diagnosis not present

## 2016-02-11 DIAGNOSIS — I6523 Occlusion and stenosis of bilateral carotid arteries: Secondary | ICD-10-CM

## 2016-02-11 NOTE — Progress Notes (Signed)
Vascular and Vein Specialist of [place]  Patient name: Ian Cummings MRN: [id number] DOB: 1929/03/17 Sex: male  REASON FOR VISIT: follow up  HPI: Ian Cummings is a 82 y.o. male who is status post right carotid stent performed at [facility] in 2013.  He is also status post right external iliac, common femoral, and profunda femoral artery endarterectomy with bovine pericardial patch angioplasty, followed by a right common femoral to below knee popliteal artery bypass graft with 6 mm Gore-Tex for a right heel ulcer and a right great toe ulcer, both of which have healed.    The patient has no complaints today.  He denies any neurologic symptoms such as numbness weakness and slurred speech.  He denies claudication symptoms.  He denies rest pain or nonhealing wounds.    Past Medical History:  Diagnosis Date  . AAA (abdominal aortic aneurysm) ([place])    2004, Pt stated not there now  . Anginal pain ([place])   . Anxiety   . Arthritis   . BPH (benign prostatic hypertrophy)   . CAD (coronary artery disease)   . Carotid artery disease ([place])    Right carotid stent 2013  . Colon polyps    adenomatous  . Depression   . GERD (gastroesophageal reflux disease)   . Gout   . H/O hiatal hernia   . Hyperlipidemia   . Hypertension   . Hypothyroidism   . MI (myocardial infarction) 4/14  . Neuropathy (HCC)    Hx: of in B/L toes  . Peripheral vascular disease ([place])    with claudication  . Pre-diabetes   . Stroke ([place])   . TIA (transient ischemic attack)    Hx: of    Family History  Problem Relation Age of Onset  . Heart disease Mother     After 47 yrs of age  . Heart attack Mother   . Hyperlipidemia Father   . Hypertension Father   . Heart disease Father     After 67 yrs of age  . Diabetes Father   . Stroke Sister   . Colon cancer Neg Hx   . Stomach cancer Neg Hx   . Rectal cancer Neg Hx   . Esophageal cancer Neg Hx   . Liver disease Neg Hx   .  Kidney disease Neg Hx     SOCIAL HISTORY: Social History  Substance Use Topics  . Smoking status: Former Smoker    Types: Cigarettes    Quit date: 04/29/1975  . Smokeless tobacco: Never Used  . Alcohol use No     Comment: Occasional drink    Allergies  Allergen Reactions  . Amoxicillin Rash  . Metoprolol Itching    Current Outpatient Prescriptions  Medication Sig Dispense Refill  . allopurinol (ZYLOPRIM) 100 MG tablet Take 200 mg by mouth daily.    Marland Kitchen aspirin EC 81 MG tablet Take 81 mg by mouth 2 (two) times daily.     Marland Kitchen atenolol (TENORMIN) 50 MG tablet Take 50 mg by mouth daily.    . cholecalciferol (VITAMIN D) 1000 UNITS tablet Take 1,000 Units by mouth daily.     . clopidogrel (PLAVIX) 75 MG tablet Take 75 mg by mouth daily.    . fish oil-omega-3 fatty acids 1000 MG capsule Take 1 g by mouth daily.     . hydrochlorothiazide (HYDRODIURIL) 25 MG tablet Take 25 mg by mouth daily.    . lansoprazole (PREVACID) 15 MG capsule Take 15 mg by mouth daily  as needed.     Marland Kitchen levothyroxine (SYNTHROID, LEVOTHROID) 88 MCG tablet Take 88 mcg by mouth daily before breakfast.    . lisinopril (PRINIVIL,ZESTRIL) 20 MG tablet Take 20 mg by mouth daily.    . multivitamin (RENA-VIT) TABS tablet Take 1 tablet by mouth daily.    Marland Kitchen PARoxetine (PAXIL) 20 MG tablet Take 20 mg by mouth every morning.    . rosuvastatin (CRESTOR) 40 MG tablet Take 40 mg by mouth daily.    . vitamin B-12 (CYANOCOBALAMIN) 1000 MCG tablet Take 1,000 mcg by mouth daily.     No current facility-administered medications for this visit.     REVIEW OF SYSTEMS:  [X]  denotes positive finding, [ ]  denotes negative finding Cardiac  Comments:  Chest pain or chest pressure:    Shortness of breath upon exertion:    Short of breath when lying flat:    Irregular heart rhythm:        Vascular    Pain in calf, thigh, or hip brought on by ambulation: x   Pain in feet at night that wakes you up from your sleep:     Blood clot in your  veins:    Leg swelling:  x       Pulmonary    Oxygen at home:    Productive cough:     Wheezing:         Neurologic    Sudden weakness in arms or legs:     Sudden numbness in arms or legs:     Sudden onset of difficulty speaking or slurred speech:    Temporary loss of vision in one eye:     Problems with dizziness:         Gastrointestinal    Blood in stool:     Vomited blood:         Genitourinary    Burning when urinating:     Blood in urine:        Psychiatric    Major depression:         Hematologic    Bleeding problems:    Problems with blood clotting too easily:        Skin    Rashes or ulcers:        Constitutional    Fever or chills:      PHYSICAL EXAM: Vitals:   02/11/16 1254 02/11/16 1256  BP: 134/68 (!) 140/58  Pulse: (!) 52 (!) 52  Resp:  16  Temp:  97.2 F (36.2 C)  TempSrc:  Oral  SpO2:  98%  Weight:  165 lb (74.8 kg)  Height:  5' 10.5" (1.791 m)    GENERAL: The patient is a well-nourished male, in no acute distress. The vital signs are documented above. CARDIAC: There is a regular rate and rhythm.  VASCULAR: Palpable dorsalis pedis pulse PULMONARY: There is good air exchange bilaterally without wheezing or rales. MUSCULOSKELETAL: There are no major deformities or cyanosis. NEUROLOGIC: No focal weakness or paresthesias are detected. SKIN: There are no ulcers or rashes noted. PSYCHIATRIC: The patient has a normal affect.  DATA:  Carotid duplex was ordered and evaluated by myself.  This shows a widely patent right carotid stent.  The left side velocities are in the 40-59 percent stenosis   MEDICAL ISSUES: Carotid stenosis: The patient will come back in 6 months for follow-up surveillance duplex  Bypass graft: The patient will follow up in 6 months with bypass graft duplex and bilateral ABIs  Annamarie Major, MD Vascular and Vein Specialists of [facility] [phone] Pager [phone]

## 2016-02-18 DIAGNOSIS — H21232 Degeneration of iris (pigmentary), left eye: Secondary | ICD-10-CM | POA: Diagnosis not present

## 2016-02-18 DIAGNOSIS — H40013 Open angle with borderline findings, low risk, bilateral: Secondary | ICD-10-CM | POA: Diagnosis not present

## 2016-02-20 ENCOUNTER — Other Ambulatory Visit: Payer: Self-pay

## 2016-02-20 NOTE — Patient Outreach (Signed)
[place] [facility]) Care Management  02/20/2016  Ian Cummings [birth year] [id number]   Telephonic Screening   Referral Date:  02/05/2016 Source:  [place] Prevent Issue:  HBP Insurance: [name] HMO MR Review:  PCP:  Dr. Marton Redwood - last appt 01/03/16 Vascular & Vein Specialists:  Dr. Serafina Mitchell DME:  RW HH:  H/o past services with [place] [facility].  H/o Essential HTN, Peripheral artery disease, Coronary Artery disease, CKD, hypercholesterolemia,  GERD, Hypothyroidism, Anxiety, Depression, Gout, Arthritis,  CABG 2, carotid stenting at [facility] in 2013, Benign prostatic hypertrophy, past h/o MI, stroke, TIA.  HDL 38.000 % 12/28/2015 LDL 49.000 mg 12/28/2015 Cholesterol, total 104.000 m 12/28/2015 Triglycerides 83.000 12/28/2015 A1C 5.100 % 01/03/2016 Glucose Random 101.000 m 12/28/2015 BP 140/58 02/11/2016 Weight 165 lb (75 kg) 02/11/2016 Height 71 in (179 cm) 02/11/2016 BMI 23.40 (Normal) 02/11/2016 Colonoscopy 03/27/2015 Former Smoker   Outreach call #1 to patient.  Patient not reached.  RN CM left HIPAA compliant voice message with name and number.  RN CM scheduled for next outreach call within one week.   [name], BSN, RN, [place] Management Care Management Coordinator [phone] Direct [phone] Cell [phone] Office [phone] Fax [email or web address]

## 2016-02-21 ENCOUNTER — Ambulatory Visit: Payer: Self-pay

## 2016-02-22 ENCOUNTER — Ambulatory Visit: Payer: Self-pay

## 2016-02-25 ENCOUNTER — Ambulatory Visit: Payer: Self-pay

## 2016-02-26 ENCOUNTER — Ambulatory Visit: Payer: Self-pay

## 2016-02-27 ENCOUNTER — Ambulatory Visit: Payer: Self-pay

## 2016-03-03 ENCOUNTER — Ambulatory Visit: Payer: Self-pay

## 2016-03-04 ENCOUNTER — Ambulatory Visit: Payer: Self-pay

## 2016-03-05 ENCOUNTER — Other Ambulatory Visit: Payer: Self-pay

## 2016-03-05 NOTE — Patient Outreach (Signed)
[place] [facility]) Care Management  03/05/2016  Ian Cummings February 28, 1929 [id number]   Telephonic Screening   Referral Date:  02/05/2016 Source:  [place]i Prevent Issue:  HBP Insurance: [name] HMO  MR Review:  PCP:  Dr. Marton Redwood - last appt 01/03/16 Vascular & Vein Specialists:  Dr. Serafina Mitchell DME:  RW HH:  H/o past services with [facility].  H/o Essential HTN, Peripheral artery disease, Coronary Artery disease, CKD, hypercholesterolemia,  GERD, Hypothyroidism, Anxiety, Depression, Gout, Arthritis,  CABG 2, carotid stenting at [facility] in 2013, Benign prostatic hypertrophy, past h/o MI, stroke, TIA.  HDL 38.000 % 12/28/2015 LDL 49.000 mg 12/28/2015 Cholesterol, total 104.000 m 12/28/2015 Triglycerides 83.000 12/28/2015 A1C 5.100 % 01/03/2016 Glucose Random 101.000 m 12/28/2015 BP 140/58 02/11/2016 Weight 165 lb (75 kg) 02/11/2016 Height 71 in (179 cm) 02/11/2016 BMI 23.40 (Normal) 02/11/2016 Colonoscopy 03/27/2015 Former Smoker   Outreach call #2 to patient.   Patient not reached.  RN CM left HIPAA compliant voice message with name and number.  RN CM scheduled for next outreach call within one week.   [name], BSN, RN, [place] Care Management Care Management Coordinator [phone] Direct [phone] Cell [phone] Office [phone] Fax [name]

## 2016-03-06 ENCOUNTER — Other Ambulatory Visit: Payer: Self-pay

## 2016-03-06 NOTE — Patient Outreach (Addendum)
[place] [facility]) Care Management  03/06/2016  Ian Cummings 1929/03/07 [id number]   Telephonic Screening   Referral Date:  02/05/2016 Source:  Emmi Prevent Issue:  HBP Insurance: [name] HMO  MR Review:  PCP:  Dr. Marton Redwood - last appt 01/03/16 Vascular & Vein Specialists:  Dr. Serafina Mitchell DME:  RW HH:  H/o past services with [facility].   H/o Essential HTN, Peripheral artery disease, Coronary Artery disease, CKD, hypercholesterolemia,  GERD, Hypothyroidism, Anxiety, Depression, Gout, Arthritis,  CABG 2, carotid stenting at [facility] in 2013, Benign prostatic hypertrophy, past h/o MI, stroke, TIA.   HDL 38.000 % 12/28/2015 LDL 49.000 mg 12/28/2015 Cholesterol, total 104.000 m 12/28/2015 Triglycerides 83.000 12/28/2015 A1C 5.100 % 01/03/2016 Glucose Random 101.000 m 12/28/2015 BP 140/58 02/11/2016 Weight 165 lb (75 kg) 02/11/2016 Height 71 in (179 cm) 02/11/2016 BMI 23.40 (Normal) 02/11/2016  Prevnar (PCV13) 05/16/2014 Pneumovax (PPS 09/28/2012 Flu Vaccine 02/04/2016 tDAP Vaccine 09/19/2010  Colonoscopy 03/27/2015 Former Smoker  Outreach call #3 to patient.   Patient reached.  Screening completed but patient denies any needs for services at this time.   RN CM provided education on [facility] CM services and advised to contact Dr. Brigitte Pulse should a new need arise for a new referral.    RN CM notified [facility]:  Case Closed - assessed; no further intervention needed.  RN CM notified Dr. Brigitte Pulse via case closure letter.    [name], BSN, RN, [name] Management Care Management Coordinator [phone] Direct [phone] Cell [phone] Office [phone] Fax [name]

## 2016-04-08 DIAGNOSIS — Z951 Presence of aortocoronary bypass graft: Secondary | ICD-10-CM | POA: Diagnosis not present

## 2016-04-08 DIAGNOSIS — I25719 Atherosclerosis of autologous vein coronary artery bypass graft(s) with unspecified angina pectoris: Secondary | ICD-10-CM | POA: Diagnosis not present

## 2016-04-08 DIAGNOSIS — E785 Hyperlipidemia, unspecified: Secondary | ICD-10-CM | POA: Diagnosis not present

## 2016-04-08 DIAGNOSIS — I479 Paroxysmal tachycardia, unspecified: Secondary | ICD-10-CM | POA: Diagnosis not present

## 2016-04-08 DIAGNOSIS — I2510 Atherosclerotic heart disease of native coronary artery without angina pectoris: Secondary | ICD-10-CM | POA: Diagnosis not present

## 2016-04-08 DIAGNOSIS — I6523 Occlusion and stenosis of bilateral carotid arteries: Secondary | ICD-10-CM | POA: Diagnosis not present

## 2016-04-08 DIAGNOSIS — I70213 Atherosclerosis of native arteries of extremities with intermittent claudication, bilateral legs: Secondary | ICD-10-CM | POA: Diagnosis not present

## 2016-04-24 DIAGNOSIS — I10 Essential (primary) hypertension: Secondary | ICD-10-CM | POA: Diagnosis not present

## 2016-04-24 DIAGNOSIS — Z6823 Body mass index (BMI) 23.0-23.9, adult: Secondary | ICD-10-CM | POA: Diagnosis not present

## 2016-05-07 DIAGNOSIS — Z6824 Body mass index (BMI) 24.0-24.9, adult: Secondary | ICD-10-CM | POA: Diagnosis not present

## 2016-05-07 DIAGNOSIS — J028 Acute pharyngitis due to other specified organisms: Secondary | ICD-10-CM | POA: Diagnosis not present

## 2016-05-07 DIAGNOSIS — R0982 Postnasal drip: Secondary | ICD-10-CM | POA: Diagnosis not present

## 2016-05-07 DIAGNOSIS — I10 Essential (primary) hypertension: Secondary | ICD-10-CM | POA: Diagnosis not present

## 2016-06-26 HISTORY — PX: CORONARY STENT PLACEMENT: SHX1402

## 2016-07-07 DIAGNOSIS — I10 Essential (primary) hypertension: Secondary | ICD-10-CM | POA: Diagnosis not present

## 2016-07-07 DIAGNOSIS — E784 Other hyperlipidemia: Secondary | ICD-10-CM | POA: Diagnosis not present

## 2016-07-07 DIAGNOSIS — I6529 Occlusion and stenosis of unspecified carotid artery: Secondary | ICD-10-CM | POA: Diagnosis not present

## 2016-07-07 DIAGNOSIS — Z9861 Coronary angioplasty status: Secondary | ICD-10-CM | POA: Diagnosis not present

## 2016-07-07 DIAGNOSIS — I252 Old myocardial infarction: Secondary | ICD-10-CM | POA: Diagnosis not present

## 2016-07-07 DIAGNOSIS — R7301 Impaired fasting glucose: Secondary | ICD-10-CM | POA: Diagnosis not present

## 2016-07-07 DIAGNOSIS — Z955 Presence of coronary angioplasty implant and graft: Secondary | ICD-10-CM | POA: Diagnosis not present

## 2016-07-07 DIAGNOSIS — I7389 Other specified peripheral vascular diseases: Secondary | ICD-10-CM | POA: Diagnosis not present

## 2016-07-07 DIAGNOSIS — I129 Hypertensive chronic kidney disease with stage 1 through stage 4 chronic kidney disease, or unspecified chronic kidney disease: Secondary | ICD-10-CM | POA: Diagnosis not present

## 2016-07-10 DIAGNOSIS — Z961 Presence of intraocular lens: Secondary | ICD-10-CM | POA: Diagnosis not present

## 2016-07-10 DIAGNOSIS — E119 Type 2 diabetes mellitus without complications: Secondary | ICD-10-CM | POA: Diagnosis not present

## 2016-07-10 DIAGNOSIS — H35373 Puckering of macula, bilateral: Secondary | ICD-10-CM | POA: Diagnosis not present

## 2016-07-10 DIAGNOSIS — H40013 Open angle with borderline findings, low risk, bilateral: Secondary | ICD-10-CM | POA: Diagnosis not present

## 2016-07-20 DIAGNOSIS — R0989 Other specified symptoms and signs involving the circulatory and respiratory systems: Secondary | ICD-10-CM | POA: Diagnosis not present

## 2016-07-20 DIAGNOSIS — F329 Major depressive disorder, single episode, unspecified: Secondary | ICD-10-CM | POA: Diagnosis not present

## 2016-07-20 DIAGNOSIS — R0789 Other chest pain: Secondary | ICD-10-CM | POA: Diagnosis not present

## 2016-07-20 DIAGNOSIS — F419 Anxiety disorder, unspecified: Secondary | ICD-10-CM | POA: Diagnosis not present

## 2016-07-20 DIAGNOSIS — I2510 Atherosclerotic heart disease of native coronary artery without angina pectoris: Secondary | ICD-10-CM | POA: Diagnosis not present

## 2016-07-20 DIAGNOSIS — I200 Unstable angina: Secondary | ICD-10-CM | POA: Diagnosis not present

## 2016-07-20 DIAGNOSIS — R011 Cardiac murmur, unspecified: Secondary | ICD-10-CM | POA: Diagnosis not present

## 2016-07-20 DIAGNOSIS — M109 Gout, unspecified: Secondary | ICD-10-CM | POA: Diagnosis not present

## 2016-07-20 DIAGNOSIS — R001 Bradycardia, unspecified: Secondary | ICD-10-CM | POA: Diagnosis not present

## 2016-07-20 DIAGNOSIS — I739 Peripheral vascular disease, unspecified: Secondary | ICD-10-CM | POA: Diagnosis not present

## 2016-07-20 DIAGNOSIS — I361 Nonrheumatic tricuspid (valve) insufficiency: Secondary | ICD-10-CM | POA: Diagnosis not present

## 2016-07-21 DIAGNOSIS — R011 Cardiac murmur, unspecified: Secondary | ICD-10-CM | POA: Diagnosis not present

## 2016-07-21 DIAGNOSIS — F329 Major depressive disorder, single episode, unspecified: Secondary | ICD-10-CM | POA: Diagnosis not present

## 2016-07-21 DIAGNOSIS — R0989 Other specified symptoms and signs involving the circulatory and respiratory systems: Secondary | ICD-10-CM | POA: Diagnosis not present

## 2016-07-21 DIAGNOSIS — I739 Peripheral vascular disease, unspecified: Secondary | ICD-10-CM | POA: Diagnosis not present

## 2016-07-21 DIAGNOSIS — I25110 Atherosclerotic heart disease of native coronary artery with unstable angina pectoris: Secondary | ICD-10-CM | POA: Diagnosis not present

## 2016-07-21 DIAGNOSIS — R69 Illness, unspecified: Secondary | ICD-10-CM | POA: Diagnosis not present

## 2016-07-21 DIAGNOSIS — I2510 Atherosclerotic heart disease of native coronary artery without angina pectoris: Secondary | ICD-10-CM | POA: Diagnosis not present

## 2016-07-21 DIAGNOSIS — I70202 Unspecified atherosclerosis of native arteries of extremities, left leg: Secondary | ICD-10-CM | POA: Diagnosis not present

## 2016-07-21 DIAGNOSIS — I361 Nonrheumatic tricuspid (valve) insufficiency: Secondary | ICD-10-CM | POA: Diagnosis not present

## 2016-07-21 DIAGNOSIS — M109 Gout, unspecified: Secondary | ICD-10-CM | POA: Diagnosis not present

## 2016-07-21 DIAGNOSIS — R001 Bradycardia, unspecified: Secondary | ICD-10-CM | POA: Diagnosis not present

## 2016-07-21 DIAGNOSIS — F419 Anxiety disorder, unspecified: Secondary | ICD-10-CM | POA: Diagnosis not present

## 2016-07-21 DIAGNOSIS — I249 Acute ischemic heart disease, unspecified: Secondary | ICD-10-CM | POA: Diagnosis not present

## 2016-07-22 DIAGNOSIS — R079 Chest pain, unspecified: Secondary | ICD-10-CM | POA: Diagnosis not present

## 2016-07-22 DIAGNOSIS — I25110 Atherosclerotic heart disease of native coronary artery with unstable angina pectoris: Secondary | ICD-10-CM | POA: Diagnosis not present

## 2016-07-22 DIAGNOSIS — R69 Illness, unspecified: Secondary | ICD-10-CM | POA: Diagnosis not present

## 2016-07-23 DIAGNOSIS — I252 Old myocardial infarction: Secondary | ICD-10-CM | POA: Diagnosis not present

## 2016-07-23 DIAGNOSIS — I25719 Atherosclerosis of autologous vein coronary artery bypass graft(s) with unspecified angina pectoris: Secondary | ICD-10-CM | POA: Diagnosis not present

## 2016-07-23 DIAGNOSIS — E039 Hypothyroidism, unspecified: Secondary | ICD-10-CM | POA: Diagnosis not present

## 2016-07-23 DIAGNOSIS — I081 Rheumatic disorders of both mitral and tricuspid valves: Secondary | ICD-10-CM | POA: Diagnosis not present

## 2016-07-23 DIAGNOSIS — K219 Gastro-esophageal reflux disease without esophagitis: Secondary | ICD-10-CM | POA: Diagnosis not present

## 2016-07-23 DIAGNOSIS — R079 Chest pain, unspecified: Secondary | ICD-10-CM | POA: Diagnosis not present

## 2016-07-23 DIAGNOSIS — I200 Unstable angina: Secondary | ICD-10-CM | POA: Diagnosis not present

## 2016-07-23 DIAGNOSIS — I2510 Atherosclerotic heart disease of native coronary artery without angina pectoris: Secondary | ICD-10-CM | POA: Diagnosis not present

## 2016-07-23 DIAGNOSIS — I739 Peripheral vascular disease, unspecified: Secondary | ICD-10-CM | POA: Diagnosis not present

## 2016-07-23 DIAGNOSIS — I25110 Atherosclerotic heart disease of native coronary artery with unstable angina pectoris: Secondary | ICD-10-CM | POA: Diagnosis not present

## 2016-07-23 DIAGNOSIS — F329 Major depressive disorder, single episode, unspecified: Secondary | ICD-10-CM | POA: Diagnosis not present

## 2016-07-23 DIAGNOSIS — I25119 Atherosclerotic heart disease of native coronary artery with unspecified angina pectoris: Secondary | ICD-10-CM | POA: Diagnosis not present

## 2016-07-23 DIAGNOSIS — Z951 Presence of aortocoronary bypass graft: Secondary | ICD-10-CM | POA: Diagnosis not present

## 2016-07-24 DIAGNOSIS — F329 Major depressive disorder, single episode, unspecified: Secondary | ICD-10-CM | POA: Diagnosis not present

## 2016-07-24 DIAGNOSIS — I2510 Atherosclerotic heart disease of native coronary artery without angina pectoris: Secondary | ICD-10-CM | POA: Diagnosis not present

## 2016-07-24 DIAGNOSIS — I252 Old myocardial infarction: Secondary | ICD-10-CM | POA: Diagnosis not present

## 2016-07-24 DIAGNOSIS — R69 Illness, unspecified: Secondary | ICD-10-CM | POA: Diagnosis not present

## 2016-07-24 DIAGNOSIS — I739 Peripheral vascular disease, unspecified: Secondary | ICD-10-CM | POA: Diagnosis not present

## 2016-07-24 DIAGNOSIS — I25719 Atherosclerosis of autologous vein coronary artery bypass graft(s) with unspecified angina pectoris: Secondary | ICD-10-CM | POA: Diagnosis not present

## 2016-07-24 DIAGNOSIS — I25110 Atherosclerotic heart disease of native coronary artery with unstable angina pectoris: Secondary | ICD-10-CM | POA: Diagnosis not present

## 2016-07-24 DIAGNOSIS — I081 Rheumatic disorders of both mitral and tricuspid valves: Secondary | ICD-10-CM | POA: Diagnosis not present

## 2016-07-24 DIAGNOSIS — Z951 Presence of aortocoronary bypass graft: Secondary | ICD-10-CM | POA: Diagnosis not present

## 2016-07-24 DIAGNOSIS — K219 Gastro-esophageal reflux disease without esophagitis: Secondary | ICD-10-CM | POA: Diagnosis not present

## 2016-07-24 DIAGNOSIS — E039 Hypothyroidism, unspecified: Secondary | ICD-10-CM | POA: Diagnosis not present

## 2016-07-24 DIAGNOSIS — I25119 Atherosclerotic heart disease of native coronary artery with unspecified angina pectoris: Secondary | ICD-10-CM | POA: Diagnosis not present

## 2016-07-25 DIAGNOSIS — R079 Chest pain, unspecified: Secondary | ICD-10-CM | POA: Diagnosis not present

## 2016-07-31 ENCOUNTER — Encounter: Payer: Self-pay | Admitting: Family

## 2016-08-08 ENCOUNTER — Other Ambulatory Visit: Payer: Self-pay | Admitting: Surgery

## 2016-08-08 DIAGNOSIS — I6529 Occlusion and stenosis of unspecified carotid artery: Secondary | ICD-10-CM

## 2016-08-11 ENCOUNTER — Ambulatory Visit (INDEPENDENT_AMBULATORY_CARE_PROVIDER_SITE_OTHER)
Admission: RE | Admit: 2016-08-11 | Discharge: 2016-08-11 | Disposition: A | Discharge status: Home / Self Care | Payer: Medicare HMO | Source: Ambulatory Visit | Attending: Family | Admitting: Family

## 2016-08-11 ENCOUNTER — Encounter: Payer: Self-pay | Admitting: Family

## 2016-08-11 ENCOUNTER — Ambulatory Visit (INDEPENDENT_AMBULATORY_CARE_PROVIDER_SITE_OTHER): Payer: Medicare HMO | Admitting: Family

## 2016-08-11 ENCOUNTER — Ambulatory Visit (HOSPITAL_COMMUNITY)
Admission: RE | Admit: 2016-08-11 | Discharge: 2016-08-11 | Disposition: A | Discharge status: Home / Self Care | Payer: Medicare HMO | Source: Ambulatory Visit | Attending: Family | Admitting: Family

## 2016-08-11 VITALS — BP 131/65 | HR 50 | Temp 96.8°F | Resp 20 | Ht 70.5 in | Wt 166.9 lb

## 2016-08-11 DIAGNOSIS — I6523 Occlusion and stenosis of bilateral carotid arteries: Secondary | ICD-10-CM

## 2016-08-11 DIAGNOSIS — I2510 Atherosclerotic heart disease of native coronary artery without angina pectoris: Secondary | ICD-10-CM | POA: Diagnosis not present

## 2016-08-11 DIAGNOSIS — I6529 Occlusion and stenosis of unspecified carotid artery: Secondary | ICD-10-CM

## 2016-08-11 DIAGNOSIS — I739 Peripheral vascular disease, unspecified: Secondary | ICD-10-CM

## 2016-08-11 DIAGNOSIS — Z955 Presence of coronary angioplasty implant and graft: Secondary | ICD-10-CM | POA: Diagnosis not present

## 2016-08-11 DIAGNOSIS — I6522 Occlusion and stenosis of left carotid artery: Secondary | ICD-10-CM | POA: Diagnosis not present

## 2016-08-11 DIAGNOSIS — I7025 Atherosclerosis of native arteries of other extremities with ulceration: Secondary | ICD-10-CM

## 2016-08-11 DIAGNOSIS — I25719 Atherosclerosis of autologous vein coronary artery bypass graft(s) with unspecified angina pectoris: Secondary | ICD-10-CM | POA: Diagnosis not present

## 2016-08-11 DIAGNOSIS — R0602 Shortness of breath: Secondary | ICD-10-CM | POA: Diagnosis not present

## 2016-08-11 DIAGNOSIS — I479 Paroxysmal tachycardia, unspecified: Secondary | ICD-10-CM | POA: Diagnosis not present

## 2016-08-11 DIAGNOSIS — I70213 Atherosclerosis of native arteries of extremities with intermittent claudication, bilateral legs: Secondary | ICD-10-CM | POA: Diagnosis not present

## 2016-08-11 DIAGNOSIS — Z951 Presence of aortocoronary bypass graft: Secondary | ICD-10-CM | POA: Diagnosis not present

## 2016-08-11 DIAGNOSIS — E785 Hyperlipidemia, unspecified: Secondary | ICD-10-CM | POA: Diagnosis not present

## 2016-08-11 LAB — VAS US CAROTID
LCCADDIAS: 16 cm/s
LCCADSYS: 60 cm/s
LCCAPDIAS: 23 cm/s
LCCAPSYS: 106 cm/s
LEFT ECA DIAS: -23 cm/s
LICAPSYS: 286 cm/s
Left ICA dist dias: -23 cm/s
Left ICA dist sys: -73 cm/s
Left ICA prox dias: 68 cm/s
RIGHT CCA MID DIAS: 11 cm/s
RIGHT ECA DIAS: 22 cm/s
Right CCA prox dias: 11 cm/s
Right CCA prox sys: 63 cm/s
Right cca dist sys: -77 cm/s

## 2016-08-11 NOTE — Patient Instructions (Signed)
Stroke Prevention Some medical conditions and behaviors are associated with an increased chance of having a stroke. You may prevent a stroke by making healthy choices and managing medical conditions. How can I reduce my risk of having a stroke?  Stay physically active. Get at least 30 minutes of activity on most or all days.  Do not smoke. It may also be helpful to avoid exposure to secondhand smoke.  Limit alcohol use. Moderate alcohol use is considered to be:  No more than 2 drinks per day for men.  No more than 1 drink per day for nonpregnant women.  Eat healthy foods. This involves:  Eating 5 or more servings of fruits and vegetables a day.  Making dietary changes that address high blood pressure (hypertension), high cholesterol, diabetes, or obesity.  Manage your cholesterol levels.  Making food choices that are high in fiber and low in saturated fat, trans fat, and cholesterol may control cholesterol levels.  Take any prescribed medicines to control cholesterol as directed by your health care provider.  Manage your diabetes.  Controlling your carbohydrate and sugar intake is recommended to manage diabetes.  Take any prescribed medicines to control diabetes as directed by your health care provider.  Control your hypertension.  Making food choices that are low in salt (sodium), saturated fat, trans fat, and cholesterol is recommended to manage hypertension.  Ask your health care provider if you need treatment to lower your blood pressure. Take any prescribed medicines to control hypertension as directed by your health care provider.  If you are 18-39 years of age, have your blood pressure checked every 3-5 years. If you are 40 years of age or older, have your blood pressure checked every year.  Maintain a healthy weight.  Reducing calorie intake and making food choices that are low in sodium, saturated fat, trans fat, and cholesterol are recommended to manage  weight.  Stop drug abuse.  Avoid taking birth control pills.  Talk to your health care provider about the risks of taking birth control pills if you are over 35 years old, smoke, get migraines, or have ever had a blood clot.  Get evaluated for sleep disorders (sleep apnea).  Talk to your health care provider about getting a sleep evaluation if you snore a lot or have excessive sleepiness.  Take medicines only as directed by your health care provider.  For some people, aspirin or blood thinners (anticoagulants) are helpful in reducing the risk of forming abnormal blood clots that can lead to stroke. If you have the irregular heart rhythm of atrial fibrillation, you should be on a blood thinner unless there is a good reason you cannot take them.  Understand all your medicine instructions.  Make sure that other conditions (such as anemia or atherosclerosis) are addressed. Get help right away if:  You have sudden weakness or numbness of the face, arm, or leg, especially on one side of the body.  Your face or eyelid droops to one side.  You have sudden confusion.  You have trouble speaking (aphasia) or understanding.  You have sudden trouble seeing in one or both eyes.  You have sudden trouble walking.  You have dizziness.  You have a loss of balance or coordination.  You have a sudden, severe headache with no known cause.  You have new chest pain or an irregular heartbeat. Any of these symptoms may represent a serious problem that is an emergency. Do not wait to see if the symptoms will go away.   Get medical help at once. Call your local emergency services (911 in [place] Do not drive yourself to the hospital.  This information is not intended to replace advice given to you by your health care provider. Make sure you discuss any questions you have with your health care provider. Document Released: 05/22/2004 Document Revised: 09/20/2015 Document Reviewed: 10/15/2012 Elsevier  Interactive Patient Education  2017 Elsevier Inc.      Peripheral Vascular Disease Peripheral vascular disease (PVD) is a disease of the blood vessels that are not part of your heart and brain. A simple term for PVD is poor circulation. In most cases, PVD narrows the blood vessels that carry blood from your heart to the rest of your body. This can result in a decreased supply of blood to your arms, legs, and internal organs, like your stomach or kidneys. However, it most often affects a person's lower legs and feet. There are two types of PVD.  Organic PVD. This is the more common type. It is caused by damage to the structure of blood vessels.  Functional PVD. This is caused by conditions that make blood vessels contract and tighten (spasm). Without treatment, PVD tends to get worse over time. PVD can also lead to acute ischemic limb. This is when an arm or limb suddenly has trouble getting enough blood. This is a medical emergency. Follow these instructions at home:  Take medicines only as told by your doctor.  Do not use any tobacco products, including cigarettes, chewing tobacco, or electronic cigarettes. If you need help quitting, ask your doctor.  Lose weight if you are overweight, and maintain a healthy weight as told by your doctor.  Eat a diet that is low in fat and cholesterol. If you need help, ask your doctor.  Exercise regularly. Ask your doctor for some good activities for you.  Take good care of your feet.  Wear comfortable shoes that fit well.  Check your feet often for any cuts or sores. Contact a doctor if:  You have cramps in your legs while walking.  You have leg pain when you are at rest.  You have coldness in a leg or foot.  Your skin changes.  You are unable to get or have an erection (erectile dysfunction).  You have cuts or sores on your feet that are not healing. Get help right away if:  Your arm or leg turns cold and blue.  Your arms or legs  become red, warm, swollen, painful, or numb.  You have chest pain or trouble breathing.  You suddenly have weakness in your face, arm, or leg.  You become very confused or you cannot speak.  You suddenly have a very bad headache.  You suddenly cannot see. This information is not intended to replace advice given to you by your health care provider. Make sure you discuss any questions you have with your health care provider. Document Released: 07/09/2009 Document Revised: 09/20/2015 Document Reviewed: 09/22/2013 Elsevier Interactive Patient Education  2017 Elsevier Inc.  

## 2016-08-11 NOTE — Progress Notes (Signed)
[facility] [place] HISTORY AND PHYSICAL   MRN : [id number]  History of Present Illness:   Ian Cummings is a 83 y.o. male who is status post right carotid stent performed at [facility] in 2013 by Dr. Harvel Ricks .  He is also status post right external iliac, common femoral, and profunda femoral artery endarterectomy with bovine pericardial patch angioplasty, followed by a right common femoral to below knee popliteal artery bypass graft with 6 mm Gore-Tex on 08-03-15 by Dr. Trula Slade for a right heel ulcer and a right great toe ulcer, both of which have healed.    He states that he has been more short of breath recently but denies chest pain, denies cough.   He has a history of CABG in 1996 x 4-5 vessels, off pump CABG x 26 September 2012 by Dr. Servando Snare.  He subsequently had a stent placed at the end of March 2018 in [place], [facility] while he was on vacation there.  He denies claudication symptoms with walking.  He denies any hx of stroke, but states in 2013, he did have an episode of double vision for about an hour and this resolved.  He denies any subsequent stroke or TIA.   Pt Diabetic: states he has pre diabetes, states his last A1C was 5.? Pt smoker: former smoker, quit in 1977, started at age 42 years   Pt meds include: Statin :Yes Betablocker: Yes ASA: Yes Other anticoagulants/antiplatelets: unknown antiplatelet since cardiac stent placed at the end of March 2018 in [place], [facility] while he was on vacation there. The Plavix was stopped. He will start cardiac rehab soon.    Current Outpatient Prescriptions  Medication Sig Dispense Refill  . allopurinol (ZYLOPRIM) 100 MG tablet Take 200 mg by mouth daily.    Marland Kitchen aspirin EC 81 MG tablet Take 81 mg by mouth 2 (two) times daily.     Marland Kitchen atenolol (TENORMIN) 50 MG tablet Take 50 mg by mouth daily.    . cholecalciferol (VITAMIN D) 1000 UNITS tablet Take 1,000 Units by mouth daily.      . clopidogrel (PLAVIX) 75 MG tablet Take 75 mg by mouth daily.    . fish oil-omega-3 fatty acids 1000 MG capsule Take 1 g by mouth daily.     . hydrochlorothiazide (HYDRODIURIL) 25 MG tablet Take 25 mg by mouth daily.    . isosorbide mononitrate (IMDUR) 30 MG 24 hr tablet     . lansoprazole (PREVACID) 15 MG capsule Take 15 mg by mouth daily as needed.     Marland Kitchen levothyroxine (SYNTHROID, LEVOTHROID) 88 MCG tablet Take 88 mcg by mouth daily before breakfast.    . lisinopril (PRINIVIL,ZESTRIL) 20 MG tablet Take 20 mg by mouth daily.    . multivitamin (RENA-VIT) TABS tablet Take 1 tablet by mouth daily.    Marland Kitchen PARoxetine (PAXIL) 20 MG tablet Take 20 mg by mouth every morning.    Marland Kitchen RANEXA 500 MG 12 hr tablet     . rosuvastatin (CRESTOR) 40 MG tablet Take 40 mg by mouth daily.    . vitamin B-12 (CYANOCOBALAMIN) 1000 MCG tablet Take 1,000 mcg by mouth daily.     No current facility-administered medications for this visit.     Past Medical History:  Diagnosis Date  . AAA (abdominal aortic aneurysm) ([place])    2004, Pt stated not there now  . Anginal pain ([place])   . Anxiety   . Arthritis   .  BPH (benign prostatic hypertrophy)   . CAD (coronary artery disease)   . Carotid artery disease ([place])    Right carotid stent 2013  . Colon polyps    adenomatous  . Depression   . GERD (gastroesophageal reflux disease)   . Gout   . H/O hiatal hernia   . Hyperlipidemia   . Hypertension   . Hypothyroidism   . MI (myocardial infarction) ([place]) 4/14  . Neuropathy    Hx: of in B/L toes  . Peripheral vascular disease ([place])    with claudication  . Pre-diabetes   . Stroke ([place])   . TIA (transient ischemic attack)    Hx: of    Social History Social History  Substance Use Topics  . Smoking status: Former Smoker    Types: Cigarettes    Quit date: 04/29/1975  . Smokeless tobacco: Never Used  . Alcohol use No     Comment: Occasional drink    Family History Family History  Problem Relation Age of  Onset  . Heart disease Mother     After 32 yrs of age  . Heart attack Mother   . Hyperlipidemia Father   . Hypertension Father   . Heart disease Father     After 64 yrs of age  . Diabetes Father   . Stroke Sister   . Colon cancer Neg Hx   . Stomach cancer Neg Hx   . Rectal cancer Neg Hx   . Esophageal cancer Neg Hx   . Liver disease Neg Hx   . Kidney disease Neg Hx     Surgical History Past Surgical History:  Procedure Laterality Date  . CAROTID STENT INSERTION Right 2013   right at [facility]  . CATARACT EXTRACTION Bilateral    Hx: of both eyes  . COLONOSCOPY     Hx: of  . COLONOSCOPY    . CORONARY ANGIOPLASTY WITH STENT PLACEMENT  2010  . CORONARY ARTERY BYPASS GRAFT  1995  . CORONARY ARTERY BYPASS GRAFT N/A 10/18/2012   Procedure: REDO CORONARY ARTERY BYPASS GRAFTING (CABG);  Surgeon: Grace Isaac, MD;  Location: [place];  Service: Open Heart Surgery;  Laterality: N/A;  off pump times two using endoscopically harvested left saphenous vein  . CORONARY STENT PLACEMENT  06/2016  . ENDARTERECTOMY FEMORAL Right 08/03/2015   Procedure: RIGHT FEMORAL ENDARTERECTOMY WITH PATCH ANGIOPLASTY;  Surgeon: Serafina Mitchell, MD;  Location: [place];  Service: Vascular;  Laterality: Right;  . FEMORAL-POPLITEAL BYPASS GRAFT Right 08/03/2015   Procedure: RIGHT FEMORAL-BELOW KNEE POPLITEAL ARTERY BYPASS GRAFT;  Surgeon: Serafina Mitchell, MD;  Location: [place];  Service: Vascular;  Laterality: Right;  . FOOT SURGERY Left   . FRACTURE SURGERY Left    Hx: of Left heel  . INGUINAL HERNIA REPAIR Bilateral    X 2   . INTRAOPERATIVE TRANSESOPHAGEAL ECHOCARDIOGRAM N/A 10/18/2012   Procedure: INTRAOPERATIVE TRANSESOPHAGEAL ECHOCARDIOGRAM;  Surgeon: Grace Isaac, MD;  Location: [place];  Service: Open Heart Surgery;  Laterality: N/A;  . LEFT HEART CATHETERIZATION WITH CORONARY/GRAFT ANGIOGRAM N/A 02/02/2013   Procedure: LEFT HEART CATHETERIZATION WITH Beatrix Fetters;  Surgeon: Jacolyn Reedy, MD;  Location: [facility] CATH LAB;  Service: Cardiovascular;  Laterality: N/A;  . LOWER EXTREMITY ANGIOGRAM Right 06/27/2015   Procedure: Lower Extremity Angiogram;  Surgeon: Serafina Mitchell, MD;  Location: [place] CV LAB;  Service: Cardiovascular;  Laterality: Right;  . MASTOIDECTOMY  1933  . PERIPHERAL VASCULAR CATHETERIZATION N/A 01/23/2015   Procedure:  Abdominal Aortogram;  Surgeon: Serafina Mitchell, MD;  Location: [place] CV LAB;  Service: Cardiovascular;  Laterality: N/A;  . PERIPHERAL VASCULAR CATHETERIZATION N/A 06/27/2015   Procedure: Abdominal Aortogram;  Surgeon: Serafina Mitchell, MD;  Location: [place] CV LAB;  Service: Cardiovascular;  Laterality: N/A;  . RETINAL DETACHMENT SURGERY Left    Hx: of left eye  . TONSILLECTOMY      Allergies  Allergen Reactions  . Amoxicillin Rash  . Metoprolol Itching    Current Outpatient Prescriptions  Medication Sig Dispense Refill  . allopurinol (ZYLOPRIM) 100 MG tablet Take 200 mg by mouth daily.    Marland Kitchen aspirin EC 81 MG tablet Take 81 mg by mouth 2 (two) times daily.     Marland Kitchen atenolol (TENORMIN) 50 MG tablet Take 50 mg by mouth daily.    . cholecalciferol (VITAMIN D) 1000 UNITS tablet Take 1,000 Units by mouth daily.     . clopidogrel (PLAVIX) 75 MG tablet Take 75 mg by mouth daily.    . fish oil-omega-3 fatty acids 1000 MG capsule Take 1 g by mouth daily.     . hydrochlorothiazide (HYDRODIURIL) 25 MG tablet Take 25 mg by mouth daily.    . isosorbide mononitrate (IMDUR) 30 MG 24 hr tablet     . lansoprazole (PREVACID) 15 MG capsule Take 15 mg by mouth daily as needed.     Marland Kitchen levothyroxine (SYNTHROID, LEVOTHROID) 88 MCG tablet Take 88 mcg by mouth daily before breakfast.    . lisinopril (PRINIVIL,ZESTRIL) 20 MG tablet Take 20 mg by mouth daily.    . multivitamin (RENA-VIT) TABS tablet Take 1 tablet by mouth daily.    Marland Kitchen PARoxetine (PAXIL) 20 MG tablet Take 20 mg by mouth every morning.    Marland Kitchen RANEXA 500 MG 12 hr tablet     . rosuvastatin  (CRESTOR) 40 MG tablet Take 40 mg by mouth daily.    . vitamin B-12 (CYANOCOBALAMIN) 1000 MCG tablet Take 1,000 mcg by mouth daily.     No current facility-administered medications for this visit.      REVIEW OF SYSTEMS: See HPI for pertinent positives and negatives.  Physical Examination Vitals:   08/11/16 1420  BP: 131/65  Pulse: (!) 50  Resp: 20  Temp: (!) 96.8 F (36 C)  TempSrc: Oral  SpO2: 97%  Weight: 166 lb 14.4 oz (75.7 kg)  Height: 5' 10.5" (1.791 m)   Body mass index is 23.61 kg/m.  General:  WDWN male in NAD, appears younger than his stated age. Gait: Normal HENT: WNL Eyes: PERRLA Pulmonary: normal non-labored breathing, good air movement except rales in both bases, no rhonchi, or wheezing Cardiac: RRR, no murmur detected  Abdomen: soft, NT, no masses palpated Skin: no rashes, no ulcers, no cellulitis.   VASCULAR EXAM  Carotid Bruits Right Left   Positive Positive       Bilateral radial pulses are 2+ palpable  Abdominal aortic pulse is palpable on expiration.                      VASCULAR EXAM: Extremities without ischemic changes  without Gangrene; without open wounds.  LE Pulses Right Left       FEMORAL  2+ palpable  2+ palpable        POPLITEAL  not palpable   not palpable       POSTERIOR TIBIAL  not palpable   not palpable        DORSALIS PEDIS      ANTERIOR TIBIAL 2+ palpable  not palpable     Musculoskeletal: no muscle wasting or atrophy; no peripheral edema  Neurologic: A&O X 3; Appropriate Affect ;  SENSATION: normal; MOTOR FUNCTION: 5/5 Symmetric, CN 2-12 intact Speech is fluent/normal    ASSESSMENT:  Ian Cummings is a 83 y.o. male who is status post right carotid stent performed at [facility] in 2013 by Dr. Harvel Ricks. He is also status post right external iliac, common femoral, and profunda femoral artery endarterectomy  with bovine pericardial patch angioplasty, followed by a right common femoral to below knee popliteal artery bypass graft with 6 mm Gore-Tex on 08-03-15 by Dr. Trula Slade for a right heel ulcer and a right great toe ulcer, both of which have healed.    He walks a great deal and stays physically active. He has no claudication symptoms with walking. There are no signs of ischemia in his feet/legs.  He will start cardiac rehab soon since his cardiac stent placement end of March 2018.   He states that he has been more short of breath recently but denies chest pain, denies cough. On auscultation of his lungs, there are rales in both bases.  I advised pt to notify Dr. Wynonia Lawman of this since pt states that Dr. Wynonia Lawman is already aware of his dyspnea, and advised him to stop the new antiplatelet medication as the possible etiology.    DATA  Today's carotid duplex suggests widely patent right ICA stent with no restenosis. Left ICA: 40-59% stenosis (high end of range).  Bilateral vertebral artery flow is antegrade.  Right subclavian artery waveforms are biphasic, left are triphasic.   ABI's today: Right: 1.06 (0.9 on 12-14-15), waveforms: PT: monophasic, DP: triphasic; TBI: 0.91 Left: 0.71 (0.56 on 12-14-15), waveforms: PT: monophasic, DP: biphasic; TBI: 0.47 Improved bilateral ABI; no significant arterial occlusive disease on the right, moderate in the left.     PLAN:   Based on today's exam and non-invasive vascular lab results, the patient will follow up in 9 months with the following tests: carotid duplex and ABI's. I discussed in depth with the patient the nature of atherosclerosis, and emphasized the importance of maximal medical management including strict control of blood pressure, blood glucose, and lipid levels, obtaining regular exercise, and cessation of smoking.  The patient is aware that without maximal medical management the underlying atherosclerotic disease process will progress, limiting the  benefit of any interventions.  The patient was given information about stroke prevention and what symptoms should prompt the patient to seek immediate medical care.  The patient was given information about PAD including signs, symptoms, treatment, what symptoms should prompt the patient to seek immediate medical care, and risk reduction measures to take. Thank you for allowing [place] to participate in this patient's care.  Clemon Chambers, RN, MSN, FNP-C [facility] Office: [phone]  Clinic MD: Trula Slade 08/11/2016 2:22 PM

## 2016-08-12 ENCOUNTER — Telehealth: Payer: Self-pay

## 2016-08-12 NOTE — Telephone Encounter (Signed)
Phone call from pt.  He stated that Dr. Wynonia Lawman thinks Ian Cummings is causing SOB, and he is to finish the Rx of Brilinta, and then discontinue and change to Plavix.  The pt. stated he forgot the name "Brilinta", when he was in the office yesterday.  He stated he wanted to make Ian Cummings aware of the medication change.  Advised will make the NP aware.

## 2016-08-12 NOTE — Addendum Note (Signed)
Addended by: Paitlyn Mcclatchey A on: 08/12/2016 09:18 AM   Modules accepted: Orders  

## 2016-08-15 ENCOUNTER — Encounter: Payer: Self-pay | Admitting: Cardiology

## 2016-08-15 DIAGNOSIS — R0609 Other forms of dyspnea: Secondary | ICD-10-CM | POA: Diagnosis not present

## 2016-08-24 DIAGNOSIS — I2510 Atherosclerotic heart disease of native coronary artery without angina pectoris: Secondary | ICD-10-CM | POA: Diagnosis not present

## 2016-08-24 DIAGNOSIS — Z9861 Coronary angioplasty status: Secondary | ICD-10-CM | POA: Diagnosis not present

## 2016-08-24 DIAGNOSIS — I10 Essential (primary) hypertension: Secondary | ICD-10-CM | POA: Diagnosis not present

## 2016-08-24 DIAGNOSIS — E784 Other hyperlipidemia: Secondary | ICD-10-CM | POA: Diagnosis not present

## 2016-08-24 DIAGNOSIS — Z6824 Body mass index (BMI) 24.0-24.9, adult: Secondary | ICD-10-CM | POA: Diagnosis not present

## 2016-08-24 DIAGNOSIS — K219 Gastro-esophageal reflux disease without esophagitis: Secondary | ICD-10-CM | POA: Diagnosis not present

## 2016-09-11 ENCOUNTER — Telehealth (HOSPITAL_COMMUNITY): Payer: Self-pay

## 2016-09-11 NOTE — Telephone Encounter (Signed)
Patient insurance is active and benefits verified. Patient has [employer] - $10.00 co-payment, no deductible, out of pocket $5900/$410.00 has been met, no co-insurance, no pre-authorization and no limit on visit. Passport/reference [phone].

## 2016-09-12 ENCOUNTER — Telehealth (HOSPITAL_COMMUNITY): Payer: Self-pay

## 2016-09-12 NOTE — Telephone Encounter (Signed)
I called and left message on patient voicemail to call office about scheduling orientation and cardiac rehab classes. I left my contact information on patient voicemail to return my call.

## 2016-09-16 DIAGNOSIS — Z951 Presence of aortocoronary bypass graft: Secondary | ICD-10-CM | POA: Diagnosis not present

## 2016-09-16 DIAGNOSIS — I70213 Atherosclerosis of native arteries of extremities with intermittent claudication, bilateral legs: Secondary | ICD-10-CM | POA: Diagnosis not present

## 2016-09-16 DIAGNOSIS — I25719 Atherosclerosis of autologous vein coronary artery bypass graft(s) with unspecified angina pectoris: Secondary | ICD-10-CM | POA: Diagnosis not present

## 2016-09-16 DIAGNOSIS — I479 Paroxysmal tachycardia, unspecified: Secondary | ICD-10-CM | POA: Diagnosis not present

## 2016-09-16 DIAGNOSIS — I2510 Atherosclerotic heart disease of native coronary artery without angina pectoris: Secondary | ICD-10-CM | POA: Diagnosis not present

## 2016-09-16 DIAGNOSIS — I6523 Occlusion and stenosis of bilateral carotid arteries: Secondary | ICD-10-CM | POA: Diagnosis not present

## 2016-09-16 DIAGNOSIS — R0609 Other forms of dyspnea: Secondary | ICD-10-CM | POA: Diagnosis not present

## 2016-09-16 DIAGNOSIS — R0602 Shortness of breath: Secondary | ICD-10-CM | POA: Diagnosis not present

## 2016-09-16 DIAGNOSIS — E785 Hyperlipidemia, unspecified: Secondary | ICD-10-CM | POA: Diagnosis not present

## 2016-09-23 ENCOUNTER — Telehealth (HOSPITAL_COMMUNITY): Payer: Self-pay

## 2016-09-23 NOTE — Telephone Encounter (Signed)
I called and left message on patient voicemail to call cardiac rehab to schedule. I left my contact information on patient voicemail to return call.

## 2016-10-01 ENCOUNTER — Encounter (HOSPITAL_COMMUNITY): Payer: Self-pay

## 2016-10-01 NOTE — Progress Notes (Signed)
*  Final Notice* I have mailed patient a letter with information about cardiac rehab. If patient does not respond to letter, referral will be canceled.

## 2016-10-14 ENCOUNTER — Telehealth (HOSPITAL_COMMUNITY): Payer: Self-pay

## 2016-10-14 NOTE — Telephone Encounter (Signed)
Referral canceled. Patient has not responded to phone calls and letter.

## 2016-11-11 DIAGNOSIS — R55 Syncope and collapse: Secondary | ICD-10-CM | POA: Diagnosis not present

## 2016-11-11 DIAGNOSIS — I2510 Atherosclerotic heart disease of native coronary artery without angina pectoris: Secondary | ICD-10-CM | POA: Diagnosis not present

## 2016-11-11 DIAGNOSIS — I10 Essential (primary) hypertension: Secondary | ICD-10-CM | POA: Diagnosis not present

## 2016-11-11 DIAGNOSIS — Z6823 Body mass index (BMI) 23.0-23.9, adult: Secondary | ICD-10-CM | POA: Diagnosis not present

## 2016-11-11 DIAGNOSIS — R7301 Impaired fasting glucose: Secondary | ICD-10-CM | POA: Diagnosis not present

## 2016-11-11 DIAGNOSIS — R5383 Other fatigue: Secondary | ICD-10-CM | POA: Diagnosis not present

## 2016-11-13 DIAGNOSIS — R7301 Impaired fasting glucose: Secondary | ICD-10-CM | POA: Diagnosis not present

## 2016-11-13 DIAGNOSIS — Z6823 Body mass index (BMI) 23.0-23.9, adult: Secondary | ICD-10-CM | POA: Diagnosis not present

## 2016-11-13 DIAGNOSIS — I951 Orthostatic hypotension: Secondary | ICD-10-CM | POA: Diagnosis not present

## 2016-11-13 DIAGNOSIS — I10 Essential (primary) hypertension: Secondary | ICD-10-CM | POA: Diagnosis not present

## 2016-12-19 DIAGNOSIS — I6523 Occlusion and stenosis of bilateral carotid arteries: Secondary | ICD-10-CM | POA: Diagnosis not present

## 2016-12-19 DIAGNOSIS — I479 Paroxysmal tachycardia, unspecified: Secondary | ICD-10-CM | POA: Diagnosis not present

## 2016-12-19 DIAGNOSIS — I2510 Atherosclerotic heart disease of native coronary artery without angina pectoris: Secondary | ICD-10-CM | POA: Diagnosis not present

## 2016-12-19 DIAGNOSIS — I25719 Atherosclerosis of autologous vein coronary artery bypass graft(s) with unspecified angina pectoris: Secondary | ICD-10-CM | POA: Diagnosis not present

## 2016-12-19 DIAGNOSIS — I70213 Atherosclerosis of native arteries of extremities with intermittent claudication, bilateral legs: Secondary | ICD-10-CM | POA: Diagnosis not present

## 2016-12-19 DIAGNOSIS — Z951 Presence of aortocoronary bypass graft: Secondary | ICD-10-CM | POA: Diagnosis not present

## 2016-12-19 DIAGNOSIS — E785 Hyperlipidemia, unspecified: Secondary | ICD-10-CM | POA: Diagnosis not present

## 2017-01-21 DIAGNOSIS — E784 Other hyperlipidemia: Secondary | ICD-10-CM | POA: Diagnosis not present

## 2017-01-21 DIAGNOSIS — E038 Other specified hypothyroidism: Secondary | ICD-10-CM | POA: Diagnosis not present

## 2017-01-21 DIAGNOSIS — I10 Essential (primary) hypertension: Secondary | ICD-10-CM | POA: Diagnosis not present

## 2017-01-21 DIAGNOSIS — R7301 Impaired fasting glucose: Secondary | ICD-10-CM | POA: Diagnosis not present

## 2017-01-21 DIAGNOSIS — M109 Gout, unspecified: Secondary | ICD-10-CM | POA: Diagnosis not present

## 2017-02-03 DIAGNOSIS — D1801 Hemangioma of skin and subcutaneous tissue: Secondary | ICD-10-CM | POA: Diagnosis not present

## 2017-02-03 DIAGNOSIS — L821 Other seborrheic keratosis: Secondary | ICD-10-CM | POA: Diagnosis not present

## 2017-02-03 DIAGNOSIS — L578 Other skin changes due to chronic exposure to nonionizing radiation: Secondary | ICD-10-CM | POA: Diagnosis not present

## 2017-02-03 DIAGNOSIS — L812 Freckles: Secondary | ICD-10-CM | POA: Diagnosis not present

## 2017-02-05 DIAGNOSIS — I252 Old myocardial infarction: Secondary | ICD-10-CM | POA: Diagnosis not present

## 2017-02-05 DIAGNOSIS — R7301 Impaired fasting glucose: Secondary | ICD-10-CM | POA: Diagnosis not present

## 2017-02-05 DIAGNOSIS — E7849 Other hyperlipidemia: Secondary | ICD-10-CM | POA: Diagnosis not present

## 2017-02-05 DIAGNOSIS — Z6823 Body mass index (BMI) 23.0-23.9, adult: Secondary | ICD-10-CM | POA: Diagnosis not present

## 2017-02-05 DIAGNOSIS — I10 Essential (primary) hypertension: Secondary | ICD-10-CM | POA: Diagnosis not present

## 2017-02-05 DIAGNOSIS — I6529 Occlusion and stenosis of unspecified carotid artery: Secondary | ICD-10-CM | POA: Diagnosis not present

## 2017-02-05 DIAGNOSIS — I209 Angina pectoris, unspecified: Secondary | ICD-10-CM | POA: Diagnosis not present

## 2017-02-05 DIAGNOSIS — Z0000 Encounter for general adult medical examination without abnormal findings: Secondary | ICD-10-CM | POA: Diagnosis not present

## 2017-02-05 DIAGNOSIS — I739 Peripheral vascular disease, unspecified: Secondary | ICD-10-CM | POA: Diagnosis not present

## 2017-02-11 DIAGNOSIS — R5383 Other fatigue: Secondary | ICD-10-CM | POA: Diagnosis not present

## 2017-02-11 DIAGNOSIS — M2550 Pain in unspecified joint: Secondary | ICD-10-CM | POA: Diagnosis not present

## 2017-02-11 DIAGNOSIS — Z6823 Body mass index (BMI) 23.0-23.9, adult: Secondary | ICD-10-CM | POA: Diagnosis not present

## 2017-02-11 DIAGNOSIS — R5381 Other malaise: Secondary | ICD-10-CM | POA: Diagnosis not present

## 2017-02-11 DIAGNOSIS — J029 Acute pharyngitis, unspecified: Secondary | ICD-10-CM | POA: Diagnosis not present

## 2017-05-18 ENCOUNTER — Ambulatory Visit: Payer: Medicare HMO | Admitting: Family

## 2017-05-18 ENCOUNTER — Encounter (HOSPITAL_COMMUNITY): Payer: Medicare HMO

## 2017-05-29 ENCOUNTER — Ambulatory Visit: Payer: Medicare HMO | Admitting: Family

## 2017-05-29 ENCOUNTER — Encounter (HOSPITAL_COMMUNITY): Payer: Medicare HMO

## 2017-06-09 DIAGNOSIS — R5383 Other fatigue: Secondary | ICD-10-CM | POA: Diagnosis not present

## 2017-06-09 DIAGNOSIS — R7301 Impaired fasting glucose: Secondary | ICD-10-CM | POA: Diagnosis not present

## 2017-06-09 DIAGNOSIS — E161 Other hypoglycemia: Secondary | ICD-10-CM | POA: Diagnosis not present

## 2017-06-09 DIAGNOSIS — Z6823 Body mass index (BMI) 23.0-23.9, adult: Secondary | ICD-10-CM | POA: Diagnosis not present

## 2017-06-09 DIAGNOSIS — I10 Essential (primary) hypertension: Secondary | ICD-10-CM | POA: Diagnosis not present

## 2017-06-19 ENCOUNTER — Encounter (HOSPITAL_COMMUNITY): Payer: Medicare HMO

## 2017-06-19 ENCOUNTER — Ambulatory Visit (HOSPITAL_COMMUNITY): Payer: Medicare HMO

## 2017-06-19 ENCOUNTER — Ambulatory Visit: Payer: Medicare HMO | Admitting: Family

## 2017-06-23 DIAGNOSIS — I70213 Atherosclerosis of native arteries of extremities with intermittent claudication, bilateral legs: Secondary | ICD-10-CM | POA: Diagnosis not present

## 2017-06-23 DIAGNOSIS — I2510 Atherosclerotic heart disease of native coronary artery without angina pectoris: Secondary | ICD-10-CM | POA: Diagnosis not present

## 2017-06-23 DIAGNOSIS — I25719 Atherosclerosis of autologous vein coronary artery bypass graft(s) with unspecified angina pectoris: Secondary | ICD-10-CM | POA: Diagnosis not present

## 2017-06-23 DIAGNOSIS — I6523 Occlusion and stenosis of bilateral carotid arteries: Secondary | ICD-10-CM | POA: Diagnosis not present

## 2017-06-23 DIAGNOSIS — E7849 Other hyperlipidemia: Secondary | ICD-10-CM | POA: Diagnosis not present

## 2017-06-23 DIAGNOSIS — I479 Paroxysmal tachycardia, unspecified: Secondary | ICD-10-CM | POA: Diagnosis not present

## 2017-06-23 DIAGNOSIS — Z951 Presence of aortocoronary bypass graft: Secondary | ICD-10-CM | POA: Diagnosis not present

## 2017-07-10 DIAGNOSIS — R5383 Other fatigue: Secondary | ICD-10-CM | POA: Diagnosis not present

## 2017-07-10 DIAGNOSIS — I10 Essential (primary) hypertension: Secondary | ICD-10-CM | POA: Diagnosis not present

## 2017-07-10 DIAGNOSIS — Z6824 Body mass index (BMI) 24.0-24.9, adult: Secondary | ICD-10-CM | POA: Diagnosis not present

## 2017-07-10 DIAGNOSIS — R7301 Impaired fasting glucose: Secondary | ICD-10-CM | POA: Diagnosis not present

## 2017-07-13 DIAGNOSIS — E119 Type 2 diabetes mellitus without complications: Secondary | ICD-10-CM | POA: Diagnosis not present

## 2017-07-13 DIAGNOSIS — H40013 Open angle with borderline findings, low risk, bilateral: Secondary | ICD-10-CM | POA: Diagnosis not present

## 2017-07-13 DIAGNOSIS — Z961 Presence of intraocular lens: Secondary | ICD-10-CM | POA: Diagnosis not present

## 2017-07-13 DIAGNOSIS — H35373 Puckering of macula, bilateral: Secondary | ICD-10-CM | POA: Diagnosis not present

## 2017-07-24 ENCOUNTER — Ambulatory Visit: Payer: Medicare HMO | Admitting: Family

## 2017-07-24 ENCOUNTER — Ambulatory Visit (INDEPENDENT_AMBULATORY_CARE_PROVIDER_SITE_OTHER)
Admission: RE | Admit: 2017-07-24 | Discharge: 2017-07-24 | Disposition: A | Discharge status: Home / Self Care | Payer: Medicare HMO | Source: Ambulatory Visit | Attending: Family | Admitting: Family

## 2017-07-24 ENCOUNTER — Encounter: Payer: Self-pay | Admitting: Family

## 2017-07-24 ENCOUNTER — Ambulatory Visit (HOSPITAL_COMMUNITY)
Admission: RE | Admit: 2017-07-24 | Discharge: 2017-07-24 | Disposition: A | Discharge status: Home / Self Care | Payer: Medicare HMO | Source: Ambulatory Visit | Attending: Family | Admitting: Family

## 2017-07-24 ENCOUNTER — Other Ambulatory Visit: Payer: Self-pay

## 2017-07-24 VITALS — BP 137/69 | HR 62 | Temp 97.1°F | Resp 18 | Ht 70.5 in | Wt 165.0 lb

## 2017-07-24 DIAGNOSIS — I7025 Atherosclerosis of native arteries of other extremities with ulceration: Secondary | ICD-10-CM | POA: Insufficient documentation

## 2017-07-24 DIAGNOSIS — I6523 Occlusion and stenosis of bilateral carotid arteries: Secondary | ICD-10-CM | POA: Diagnosis not present

## 2017-07-24 DIAGNOSIS — E1151 Type 2 diabetes mellitus with diabetic peripheral angiopathy without gangrene: Secondary | ICD-10-CM | POA: Diagnosis not present

## 2017-07-24 NOTE — Patient Instructions (Signed)
Stroke Prevention Some health problems and behaviors may make it more likely for you to have a stroke. Below are ways to lessen your risk of having a stroke.  Be active for at least 30 minutes on most or all days.  Do not smoke. Try not to be around others who smoke.  Do not drink too much alcohol. ? Do not have more than 2 drinks a day if you are a man. ? Do not have more than 1 drink a day if you are a woman and are not pregnant.  Eat healthy foods, such as fruits and vegetables. If you were put on a specific diet, follow the diet as told.  Keep your cholesterol levels under control through diet and medicines. Look for foods that are low in saturated fat, trans fat, cholesterol, and are high in fiber.  If you have diabetes, follow all diet plans and take your medicine as told.  Ask your doctor if you need treatment to lower your blood pressure. If you have high blood pressure (hypertension), follow all diet plans and take your medicine as told by your doctor.  If you are 18-39 years old, have your blood pressure checked every 3-5 years. If you are age 40 or older, have your blood pressure checked every year.  Keep a healthy weight. Eat foods that are low in calories, salt, saturated fat, trans fat, and cholesterol.  Do not take drugs.  Avoid birth control pills, if this applies. Talk to your doctor about the risks of taking birth control pills.  Talk to your doctor if you have sleep problems (sleep apnea).  Take all medicine as told by your doctor. ? You may be told to take aspirin or blood thinner medicine. Take this medicine as told by your doctor. ? Understand your medicine instructions.  Make sure any other conditions you have are being taken care of.  Get help right away if:  You suddenly lose feeling (you feel numb) or have weakness in your face, arm, or leg.  Your face or eyelid hangs down to one side.  You suddenly feel confused.  You have trouble talking  (aphasia) or understanding what people are saying.  You suddenly have trouble seeing in one or both eyes.  You suddenly have trouble walking.  You are dizzy.  You lose your balance or your movements are clumsy (uncoordinated).  You suddenly have a very bad headache and you do not know the cause.  You have new chest pain.  Your heart feels like it is fluttering or skipping a beat (irregular heartbeat). Do not wait to see if the symptoms above go away. Get help right away. Call your local emergency services (911 in [place] Do not drive yourself to the hospital. This information is not intended to replace advice given to you by your health care provider. Make sure you discuss any questions you have with your health care provider. Document Released: 10/14/2011 Document Revised: 09/20/2015 Document Reviewed: 10/15/2012 Elsevier Interactive Patient Education  2018 Elsevier Inc.     Peripheral Vascular Disease Peripheral vascular disease (PVD) is a disease of the blood vessels that are not part of your heart and brain. A simple term for PVD is poor circulation. In most cases, PVD narrows the blood vessels that carry blood from your heart to the rest of your body. This can result in a decreased supply of blood to your arms, legs, and internal organs, like your stomach or kidneys. However, it most often affects   a person's lower legs and feet. There are two types of PVD.  Organic PVD. This is the more common type. It is caused by damage to the structure of blood vessels.  Functional PVD. This is caused by conditions that make blood vessels contract and tighten (spasm).  Without treatment, PVD tends to get worse over time. PVD can also lead to acute ischemic limb. This is when an arm or limb suddenly has trouble getting enough blood. This is a medical emergency. Follow these instructions at home:  Take medicines only as told by your doctor.  Do not use any tobacco products, including  cigarettes, chewing tobacco, or electronic cigarettes. If you need help quitting, ask your doctor.  Lose weight if you are overweight, and maintain a healthy weight as told by your doctor.  Eat a diet that is low in fat and cholesterol. If you need help, ask your doctor.  Exercise regularly. Ask your doctor for some good activities for you.  Take good care of your feet. ? Wear comfortable shoes that fit well. ? Check your feet often for any cuts or sores. Contact a doctor if:  You have cramps in your legs while walking.  You have leg pain when you are at rest.  You have coldness in a leg or foot.  Your skin changes.  You are unable to get or have an erection (erectile dysfunction).  You have cuts or sores on your feet that are not healing. Get help right away if:  Your arm or leg turns cold and blue.  Your arms or legs become red, warm, swollen, painful, or numb.  You have chest pain or trouble breathing.  You suddenly have weakness in your face, arm, or leg.  You become very confused or you cannot speak.  You suddenly have a very bad headache.  You suddenly cannot see. This information is not intended to replace advice given to you by your health care provider. Make sure you discuss any questions you have with your health care provider. Document Released: 07/09/2009 Document Revised: 09/20/2015 Document Reviewed: 09/22/2013 Elsevier Interactive Patient Education  2017 Elsevier Inc.  

## 2017-07-24 NOTE — Progress Notes (Signed)
[facility] [place] HISTORY AND PHYSICAL   CC: Follow up extracranial carotid artery stenosis and peripheral artery occlusive disease    History of Present Illness:   Ian Cummings is a 84 y.o. male who is status post right carotid stent performed at [facility] in 2013 by Dr. Harvel Ricks . He is also status post right external iliac, common femoral, and profunda femoral artery endarterectomy with bovine pericardial patch angioplasty, followed by a right common femoral to below knee popliteal artery bypass graft with 6 mm Gore-Tex on 08-03-15 by Dr. Trula Slade for a right heel ulcer and a right great toe ulcer, both of which have healed.   He states that he has been more short of breath recently but denies chest pain, denies cough.   He has a history of CABG in 1996 x 4-5 vessels, off pump CABG x 26 September 2012 by Dr. Servando Snare.  He subsequently had a stent placed at the end of March 2018 in [place], [facility] while he was on vacation there.  He declined cardiac rehab.   He denies claudication symptoms in his legs with walking.  His walking is limited by bursitis pain in both hips.   He denies any hx of stroke, but states in 2013, he did have an episode of double vision for about an hour and this resolved.  He denies any subsequent stroke or TIA.   Pt Diabetic: states he has pre diabetes, states his last A1C was 5.3 Pt smoker: former smoker, quit in 1977, started at age 14 years   Pt meds include: Statin :Yes Betablocker: Yes ASA: Yes Other anticoagulants/antiplatelets: Plavix    Current Outpatient Medications  Medication Sig Dispense Refill  . allopurinol (ZYLOPRIM) 100 MG tablet Take 200 mg by mouth daily.    Marland Kitchen aspirin EC 81 MG tablet Take 81 mg by mouth 2 (two) times daily.     Marland Kitchen atenolol (TENORMIN) 50 MG tablet Take 50 mg by mouth daily.    . cholecalciferol (VITAMIN D) 1000 UNITS tablet Take 1,000 Units by mouth daily.     .  clopidogrel (PLAVIX) 75 MG tablet Take 75 mg by mouth daily.    . fish oil-omega-3 fatty acids 1000 MG capsule Take 1 g by mouth daily.     . isosorbide mononitrate (IMDUR) 30 MG 24 hr tablet     . lansoprazole (PREVACID) 15 MG capsule Take 15 mg by mouth daily as needed.     Marland Kitchen levothyroxine (SYNTHROID, LEVOTHROID) 88 MCG tablet Take 88 mcg by mouth daily before breakfast.    . lisinopril (PRINIVIL,ZESTRIL) 20 MG tablet Take 20 mg by mouth daily.    . multivitamin (RENA-VIT) TABS tablet Take 1 tablet by mouth daily.    Marland Kitchen PARoxetine (PAXIL) 20 MG tablet Take 20 mg by mouth every morning.    Marland Kitchen RANEXA 500 MG 12 hr tablet     . rosuvastatin (CRESTOR) 40 MG tablet Take 40 mg by mouth daily.    . vitamin B-12 (CYANOCOBALAMIN) 1000 MCG tablet Take 1,000 mcg by mouth daily.    . hydrochlorothiazide (HYDRODIURIL) 25 MG tablet Take 25 mg by mouth daily.     No current facility-administered medications for this visit.     Past Medical History:  Diagnosis Date  . AAA (abdominal aortic aneurysm) ([place])    2004, Pt stated not there now  . Anginal pain ([place])   . Anxiety   . Arthritis   . BPH (benign  prostatic hypertrophy)   . CAD (coronary artery disease)   . Carotid artery disease ([place])    Right carotid stent 2013  . Colon polyps    adenomatous  . Depression   . GERD (gastroesophageal reflux disease)   . Gout   . H/O hiatal hernia   . Hyperlipidemia   . Hypertension   . Hypothyroidism   . MI (myocardial infarction) ([place]) 4/14  . Neuropathy    Hx: of in B/L toes  . Peripheral vascular disease ([place])    with claudication  . Pre-diabetes   . Stroke ([place])   . TIA (transient ischemic attack)    Hx: of    Social History Social History   Tobacco Use  . Smoking status: Former Smoker    Types: Cigarettes    Last attempt to quit: 04/29/1975    Years since quitting: 42.2  . Smokeless tobacco: Never Used  Substance Use Topics  . Alcohol use: No    Alcohol/week: 10.8 - 12.0 oz    Types:  12 Glasses of wine, 6 - 8 Shots of liquor per week    Comment: Occasional drink  . Drug use: No    Family History Family History  Problem Relation Age of Onset  . Heart disease Mother        After 7 yrs of age  . Heart attack Mother   . Hyperlipidemia Father   . Hypertension Father   . Heart disease Father        After 14 yrs of age  . Diabetes Father   . Stroke Sister   . Colon cancer Neg Hx   . Stomach cancer Neg Hx   . Rectal cancer Neg Hx   . Esophageal cancer Neg Hx   . Liver disease Neg Hx   . Kidney disease Neg Hx     Surgical History Past Surgical History:  Procedure Laterality Date  . CAROTID STENT INSERTION Right 2013   right at [facility]  . CATARACT EXTRACTION Bilateral    Hx: of both eyes  . COLONOSCOPY     Hx: of  . COLONOSCOPY    . CORONARY ANGIOPLASTY WITH STENT PLACEMENT  2010  . CORONARY ARTERY BYPASS GRAFT  1995  . CORONARY ARTERY BYPASS GRAFT N/A 10/18/2012   Procedure: REDO CORONARY ARTERY BYPASS GRAFTING (CABG);  Surgeon: Grace Isaac, MD;  Location: [place];  Service: Open Heart Surgery;  Laterality: N/A;  off pump times two using endoscopically harvested left saphenous vein  . CORONARY STENT PLACEMENT  06/2016  . ENDARTERECTOMY FEMORAL Right 08/03/2015   Procedure: RIGHT FEMORAL ENDARTERECTOMY WITH PATCH ANGIOPLASTY;  Surgeon: Serafina Mitchell, MD;  Location: [place];  Service: Vascular;  Laterality: Right;  . FEMORAL-POPLITEAL BYPASS GRAFT Right 08/03/2015   Procedure: RIGHT FEMORAL-BELOW KNEE POPLITEAL ARTERY BYPASS GRAFT;  Surgeon: Serafina Mitchell, MD;  Location: [place];  Service: Vascular;  Laterality: Right;  . FOOT SURGERY Left   . FRACTURE SURGERY Left    Hx: of Left heel  . INGUINAL HERNIA REPAIR Bilateral    X 2   . INTRAOPERATIVE TRANSESOPHAGEAL ECHOCARDIOGRAM N/A 10/18/2012   Procedure: INTRAOPERATIVE TRANSESOPHAGEAL ECHOCARDIOGRAM;  Surgeon: Grace Isaac, MD;  Location: [place];  Service: Open Heart Surgery;  Laterality: N/A;   . LEFT HEART CATHETERIZATION WITH CORONARY/GRAFT ANGIOGRAM N/A 02/02/2013   Procedure: LEFT HEART CATHETERIZATION WITH Beatrix Fetters;  Surgeon: Jacolyn Reedy, MD;  Location: [facility] CATH LAB;  Service: Cardiovascular;  Laterality: N/A;  .  LOWER EXTREMITY ANGIOGRAM Right 06/27/2015   Procedure: Lower Extremity Angiogram;  Surgeon: Serafina Mitchell, MD;  Location: [place] CV LAB;  Service: Cardiovascular;  Laterality: Right;  . MASTOIDECTOMY  1933  . PERIPHERAL VASCULAR CATHETERIZATION N/A 01/23/2015   Procedure: Abdominal Aortogram;  Surgeon: Serafina Mitchell, MD;  Location: [place] CV LAB;  Service: Cardiovascular;  Laterality: N/A;  . PERIPHERAL VASCULAR CATHETERIZATION N/A 06/27/2015   Procedure: Abdominal Aortogram;  Surgeon: Serafina Mitchell, MD;  Location: [place] CV LAB;  Service: Cardiovascular;  Laterality: N/A;  . RETINAL DETACHMENT SURGERY Left    Hx: of left eye  . TONSILLECTOMY      Allergies  Allergen Reactions  . Amoxicillin Rash  . Metoprolol Itching    Current Outpatient Medications  Medication Sig Dispense Refill  . allopurinol (ZYLOPRIM) 100 MG tablet Take 200 mg by mouth daily.    Marland Kitchen aspirin EC 81 MG tablet Take 81 mg by mouth 2 (two) times daily.     Marland Kitchen atenolol (TENORMIN) 50 MG tablet Take 50 mg by mouth daily.    . cholecalciferol (VITAMIN D) 1000 UNITS tablet Take 1,000 Units by mouth daily.     . clopidogrel (PLAVIX) 75 MG tablet Take 75 mg by mouth daily.    . fish oil-omega-3 fatty acids 1000 MG capsule Take 1 g by mouth daily.     . isosorbide mononitrate (IMDUR) 30 MG 24 hr tablet     . lansoprazole (PREVACID) 15 MG capsule Take 15 mg by mouth daily as needed.     Marland Kitchen levothyroxine (SYNTHROID, LEVOTHROID) 88 MCG tablet Take 88 mcg by mouth daily before breakfast.    . lisinopril (PRINIVIL,ZESTRIL) 20 MG tablet Take 20 mg by mouth daily.    . multivitamin (RENA-VIT) TABS tablet Take 1 tablet by mouth daily.    Marland Kitchen PARoxetine (PAXIL) 20 MG tablet  Take 20 mg by mouth every morning.    Marland Kitchen RANEXA 500 MG 12 hr tablet     . rosuvastatin (CRESTOR) 40 MG tablet Take 40 mg by mouth daily.    . vitamin B-12 (CYANOCOBALAMIN) 1000 MCG tablet Take 1,000 mcg by mouth daily.    . hydrochlorothiazide (HYDRODIURIL) 25 MG tablet Take 25 mg by mouth daily.     No current facility-administered medications for this visit.      REVIEW OF SYSTEMS: See HPI for pertinent positives and negatives.  Physical Examination Vitals:   07/24/17 1322 07/24/17 1325 07/24/17 1327  BP: (!) 142/66 140/71 137/69  Pulse: 62 62 62  Resp: 18    Temp: (!) 97.1 F (36.2 C)    TempSrc: Oral    SpO2: 97%    Weight: 165 lb (74.8 kg)    Height: 5' 10.5" (1.791 m)     Body mass index is 23.34 kg/m.  General:  WDWN male in NAD, appears younger than his stated age. Gait: Normal HENT: WNL Eyes: PERRLA Pulmonary: normal non-labored breathing, good air movement except fine rales in both bases, no rhonchi, or wheezing Cardiac: RRR, no murmur detected Abdomen: soft, NT, no masses palpated Skin: no rashes, no ulcers, no cellulitis.   VASCULAR EXAM  Carotid Bruits Right Left   Positive Positive        Bilateral radial pulses are 2+ palpable  Abdominal aortic pulse is palpable on expiration.                      VASCULAR EXAM: Extremities without ischemic changes  without Gangrene; without open wounds.                                                                                                                                                       LE Pulses Right Left       FEMORAL  2+ palpable  2+ palpable        POPLITEAL  not palpable   not palpable       POSTERIOR TIBIAL  1+ palpable   not palpable        DORSALIS PEDIS      ANTERIOR TIBIAL 2+ palpable  faintly palpable     Musculoskeletal: no muscle wasting or atrophy; no peripheral edema     Neurologic:  A&O X 3; appropriate affect, sensation is normal; speech is normal, CN 2-12  intact, pain and light touch intact in extremities, motor exam as listed above. Psychiatric: Normal thought content, mood appropriate to clinical situation.     ASSESSMENT:  Ian Cummings is a 84 y.o. male who is status post right carotid stent performed at [facility] in 2013 by Dr. Harvel Ricks.  He is also status post right external iliac, common femoral, and profunda femoral artery endarterectomy with bovine pericardial patch angioplasty, followed by a right common femoral to below knee popliteal artery bypass graft with 6 mm Gore-Tex on 08-03-15 by Dr. Trula Slade for a right heel ulcer and a right great toe ulcer, both of which have healed.   He walks a great deal and stays physically active. He has no claudication symptoms with walking. There are no signs of ischemia in his feet/legs.  Pain in both hips limit his walking. Bilateral femoral pulses are 2+ palpable.   He has no history of stroke or TIA.    DATA  Carotid Duplex (07/24/17): Widely patent right ICA stent with no restenosis. Left ICA: 60-79% stenosis . High bifurcation.   Bilateral ECA stenosis >50%. Bilateral vertebral artery flow is antegrade.  Bilateral subclavian artery waveforms are normal.  Increased stenosis in the left ICA compared to the exam on 08-11-16.   ABI (Date: 07/24/2017):  R:   ABI: 1.07 (was 1.06 on 08-11-16),   PT: bi  DP: bi  TBI:  0.74 (was 0.91)  L:   ABI: 0.65 (was 0.71),   PT: mono  DP: mono  TBI: 0.34 (was 0.47)  Stable bilateral ABI with no significant disease on the right, biphasic waveforms; moderate disease on the left with monophasic waveforms.   Decline in bilateral TBI.    PLAN:   Based on today's exam and non-invasive vascular lab results, the patient will follow up in 9 months with the following tests: carotid duplex and ABI's.   I discussed in depth with the patient the nature of atherosclerosis, and emphasized the importance of maximal medical management  including  strict control of blood pressure, blood glucose, and lipid levels, obtaining regular exercise, and cessation of smoking.  The patient is aware that without maximal medical management the underlying atherosclerotic disease process will progress, limiting the benefit of any interventions.  The patient was given information about stroke prevention and what symptoms should prompt the patient to seek immediate medical care.  The patient was given information about PAD including signs, symptoms, treatment, what symptoms should prompt the patient to seek immediate medical care, and risk reduction measures to take.  Thank you for allowing [place] to participate in this patient's care.  Clemon Chambers, RN, MSN, FNP-C [facility] Office: [phone]  Clinic MD: Donzetta Matters on call 07/24/2017 1:29 PM

## 2017-07-30 IMAGING — MR MR LUMBAR SPINE W/O CM
4 of 5 series · 25 of 48 positions shown · non-contrast
Comparison: None.

CLINICAL DATA: Low back pain extending to the right leg, 3 months
duration. Numbness in the feet.

EXAM:
MRI LUMBAR SPINE WITHOUT CONTRAST
TECHNIQUE: Multiplanar, multisequence MR imaging of the lumbar spine was
performed. No intravenous contrast was administered.

[Series 3: T2 · sagittal · 4.0mm · 0.55mm/px · 7 of 15 slices shown (1 of 2)]
[im 1/15]
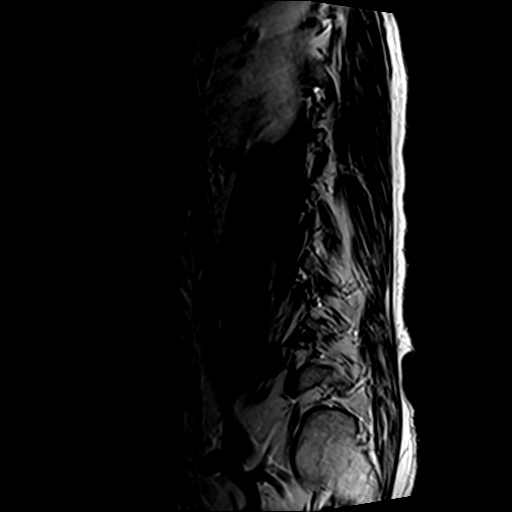
[im 3/15]
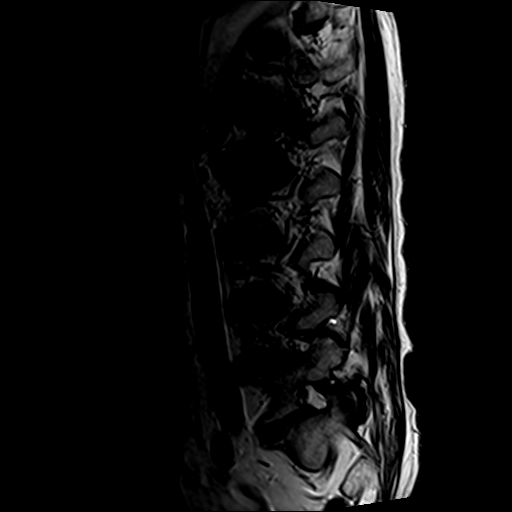
[im 5/15]
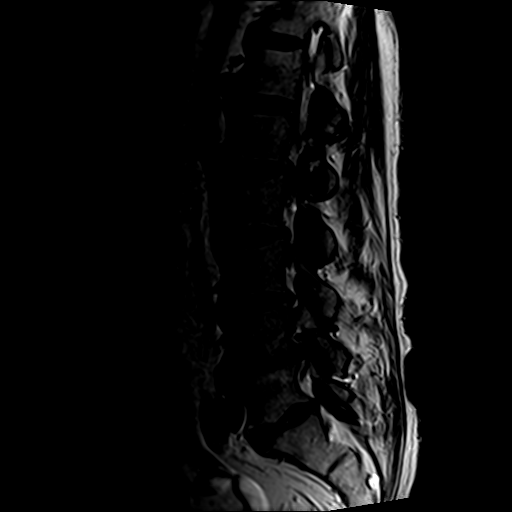
[im 8/15]
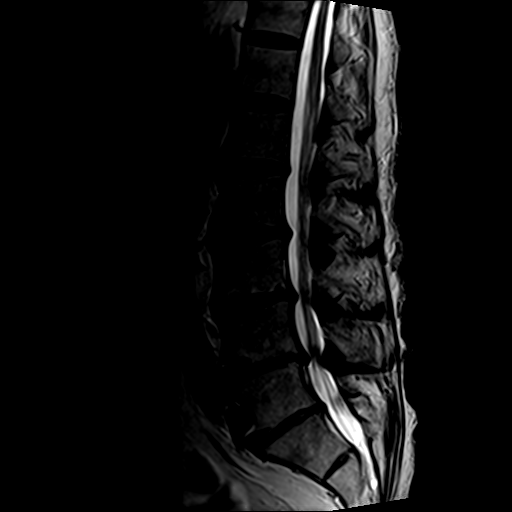
[im 10/15]
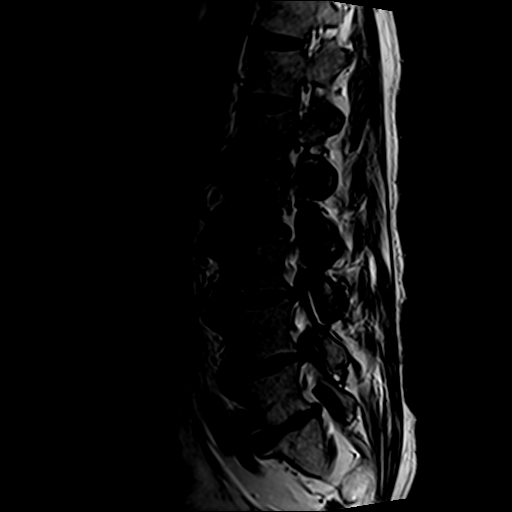
[im 12/15]
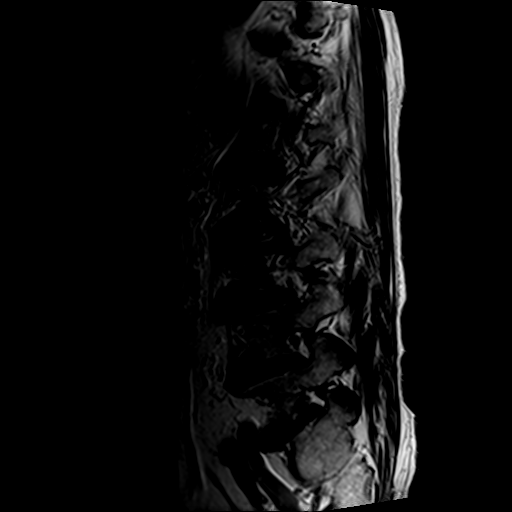
[im 15/15]
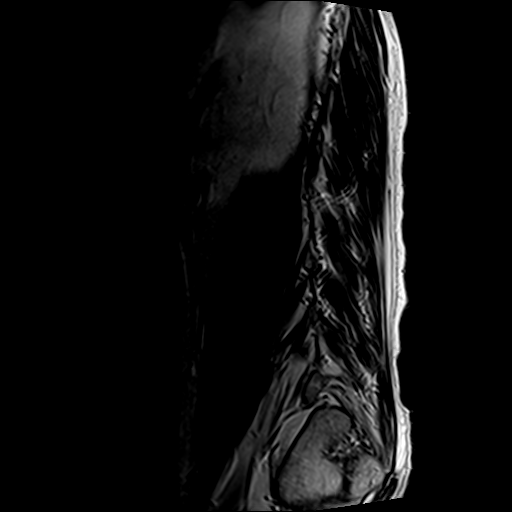

[Series 4: T1 · sagittal · 4.0mm · 0.55mm/px · 7 of 15 slices shown (1 of 2)]
[im 1/15]
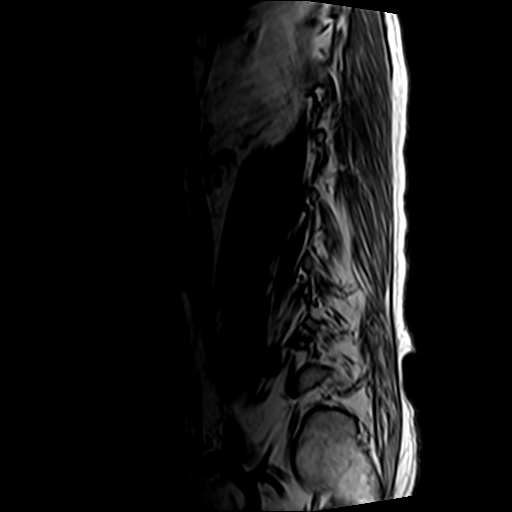
[im 3/15]
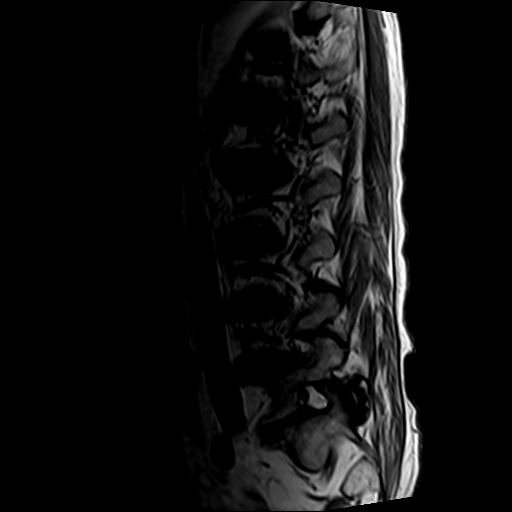
[im 5/15]
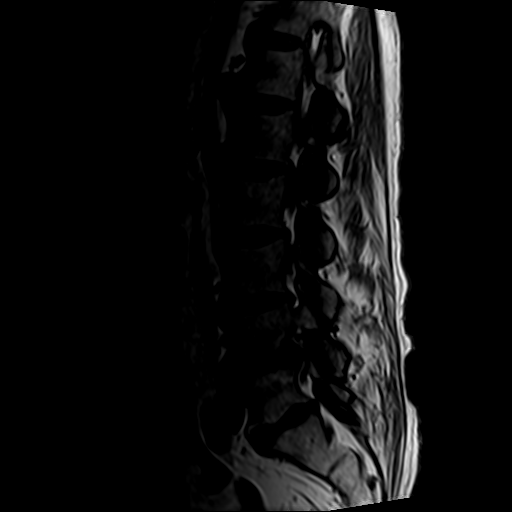
[im 8/15]
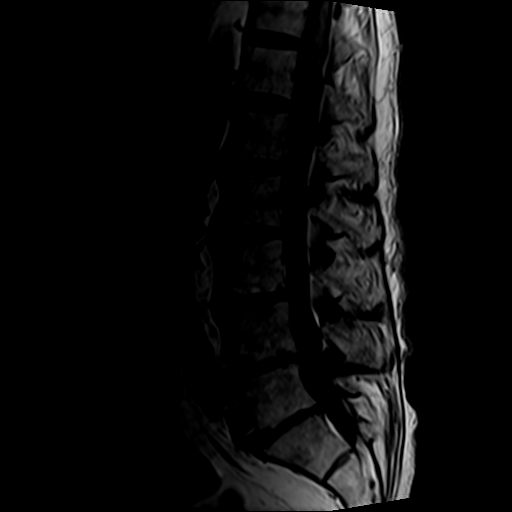
[im 10/15]
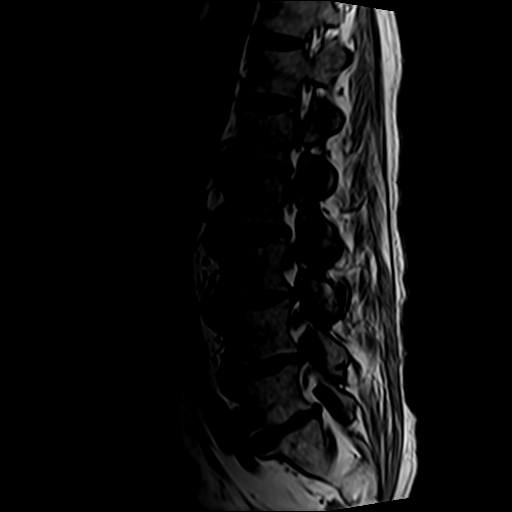
[im 12/15]
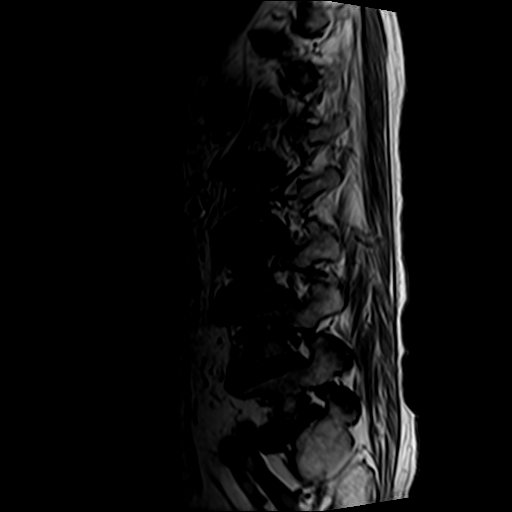
[im 15/15]
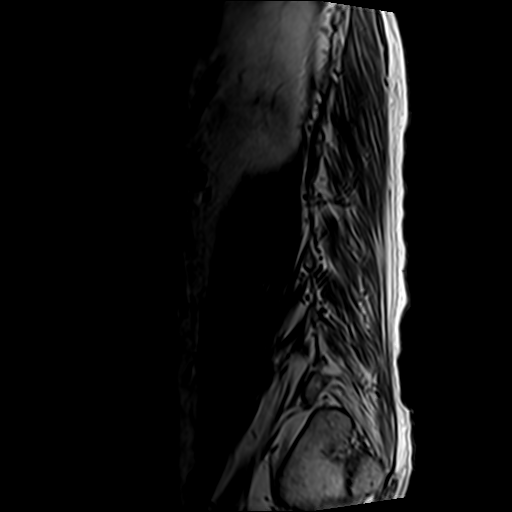

[Series 6: T2 · axial · 4.0mm · 0.70mm/px · z∈[-38,+139]mm · 8 of 33 slices shown (2 of 2)]
[im 1/33]
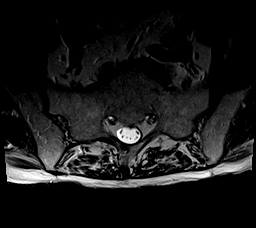
[im 5/33]
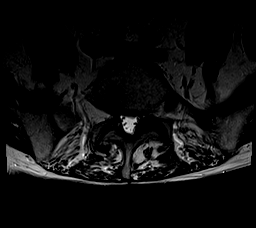
[im 10/33]
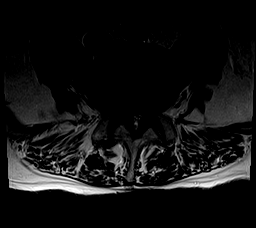
[im 15/33]
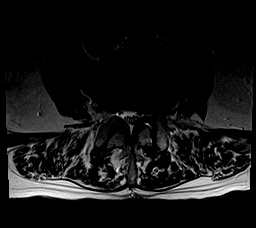
[im 18/33]
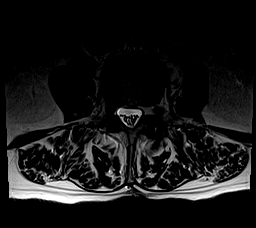
[im 23/33]
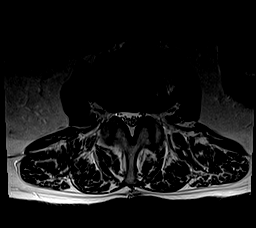
[im 28/33]
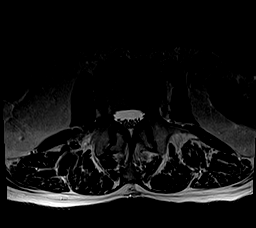
[im 33/33]
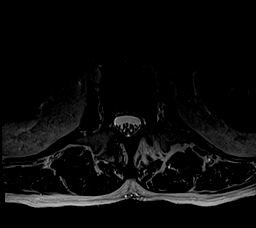

[Series 7: T1 · axial · 4.0mm · 0.35mm/px · z∈[-18,+114]mm · 3 of 33 slices shown (2 of 2)]
[im 5/33]
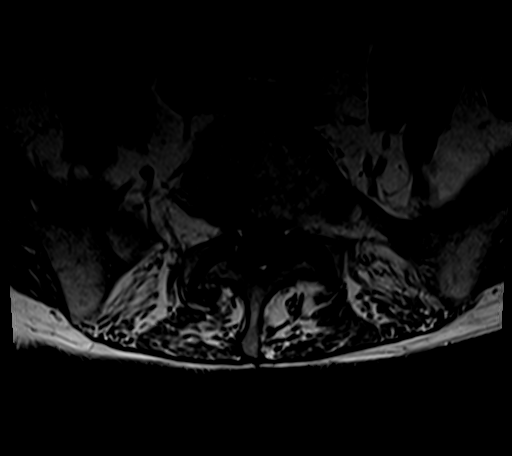
[im 18/33]
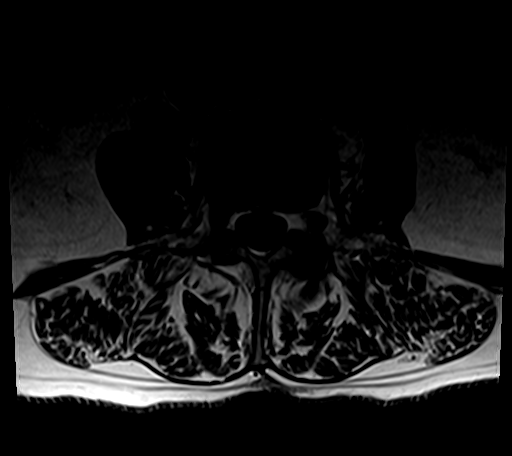
[im 28/33]
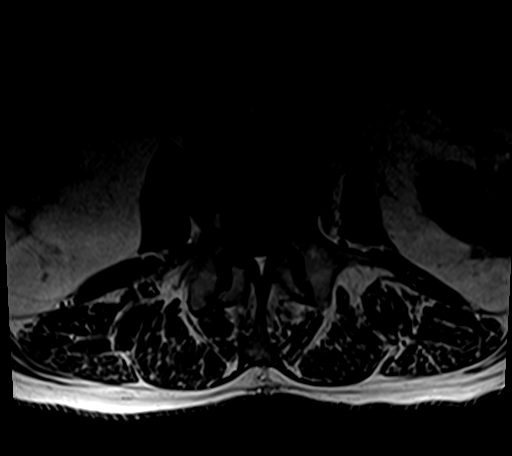

[25 of 48 positions shown; findings below may reference images not displayed]

FINDINGS: T11-12 and T12-L1 are normal.

L1-2: Mild bulging of the disc. No compressive stenosis. Conus tip
at L1.

L2-3: Bulging of the disc. Mild narrowing of the lateral recesses
but without visible neural compression.

L3-4: Bulging of the disc more towards the left. Facet degeneration
and hypertrophy. Stenosis of the lateral recesses, left worse than
right. Neural compression could occur, particularly on the left.

L4-5: Disc degeneration with endplate osteophytes and
circumferential protrusion of disc material. Bilateral facet and
ligamentous hypertrophy. Severe spinal stenosis at this level that
could cause neural compression on either or both sides.

L5-S1: Endplate osteophytes and bulging of the disc. Bilateral facet
degeneration. Mild narrowing of the lateral recesses and neural
foramina but without visible neural compression.
IMPRESSION: Severe multifactorial spinal stenosis at L4-5 that could cause
neural compression on either or both sides.

Lateral recess and foraminal stenosis at L3-4, worse on the left
than the right. Neural compression could occur on either side at
this level, more likely the left.

## 2017-08-03 DIAGNOSIS — J020 Streptococcal pharyngitis: Secondary | ICD-10-CM | POA: Diagnosis not present

## 2017-08-03 DIAGNOSIS — R05 Cough: Secondary | ICD-10-CM | POA: Diagnosis not present

## 2017-08-03 DIAGNOSIS — J111 Influenza due to unidentified influenza virus with other respiratory manifestations: Secondary | ICD-10-CM | POA: Diagnosis not present

## 2017-08-03 DIAGNOSIS — Z6824 Body mass index (BMI) 24.0-24.9, adult: Secondary | ICD-10-CM | POA: Diagnosis not present

## 2017-08-03 DIAGNOSIS — J181 Lobar pneumonia, unspecified organism: Secondary | ICD-10-CM | POA: Diagnosis not present

## 2017-08-04 ENCOUNTER — Telehealth: Payer: Self-pay

## 2017-08-04 NOTE — Telephone Encounter (Signed)
Pt called to reschedule sleep consult appt. Please call pt to reschedule.

## 2017-08-10 DIAGNOSIS — Z6822 Body mass index (BMI) 22.0-22.9, adult: Secondary | ICD-10-CM | POA: Diagnosis not present

## 2017-08-10 DIAGNOSIS — R05 Cough: Secondary | ICD-10-CM | POA: Diagnosis not present

## 2017-08-10 DIAGNOSIS — R5383 Other fatigue: Secondary | ICD-10-CM | POA: Diagnosis not present

## 2017-08-10 DIAGNOSIS — N183 Chronic kidney disease, stage 3 (moderate): Secondary | ICD-10-CM | POA: Diagnosis not present

## 2017-08-10 DIAGNOSIS — J181 Lobar pneumonia, unspecified organism: Secondary | ICD-10-CM | POA: Diagnosis not present

## 2017-08-21 DIAGNOSIS — R05 Cough: Secondary | ICD-10-CM | POA: Diagnosis not present

## 2017-08-21 DIAGNOSIS — Z6822 Body mass index (BMI) 22.0-22.9, adult: Secondary | ICD-10-CM | POA: Diagnosis not present

## 2017-08-21 DIAGNOSIS — J181 Lobar pneumonia, unspecified organism: Secondary | ICD-10-CM | POA: Diagnosis not present

## 2017-08-21 DIAGNOSIS — R062 Wheezing: Secondary | ICD-10-CM | POA: Diagnosis not present

## 2017-08-21 DIAGNOSIS — J019 Acute sinusitis, unspecified: Secondary | ICD-10-CM | POA: Diagnosis not present

## 2017-08-21 DIAGNOSIS — I10 Essential (primary) hypertension: Secondary | ICD-10-CM | POA: Diagnosis not present

## 2017-09-08 DIAGNOSIS — J111 Influenza due to unidentified influenza virus with other respiratory manifestations: Secondary | ICD-10-CM | POA: Diagnosis not present

## 2017-09-08 DIAGNOSIS — Z6822 Body mass index (BMI) 22.0-22.9, adult: Secondary | ICD-10-CM | POA: Diagnosis not present

## 2017-09-08 DIAGNOSIS — J029 Acute pharyngitis, unspecified: Secondary | ICD-10-CM | POA: Diagnosis not present

## 2017-09-08 DIAGNOSIS — R5383 Other fatigue: Secondary | ICD-10-CM | POA: Diagnosis not present

## 2017-09-08 DIAGNOSIS — J189 Pneumonia, unspecified organism: Secondary | ICD-10-CM | POA: Diagnosis not present

## 2017-09-08 DIAGNOSIS — H6121 Impacted cerumen, right ear: Secondary | ICD-10-CM | POA: Diagnosis not present

## 2017-09-08 DIAGNOSIS — R05 Cough: Secondary | ICD-10-CM | POA: Diagnosis not present

## 2017-09-10 ENCOUNTER — Institutional Professional Consult (permissible substitution): Payer: Self-pay | Admitting: Neurology

## 2017-09-10 DIAGNOSIS — R5383 Other fatigue: Secondary | ICD-10-CM | POA: Diagnosis not present

## 2017-09-10 DIAGNOSIS — J189 Pneumonia, unspecified organism: Secondary | ICD-10-CM | POA: Diagnosis not present

## 2017-09-10 DIAGNOSIS — J111 Influenza due to unidentified influenza virus with other respiratory manifestations: Secondary | ICD-10-CM | POA: Diagnosis not present

## 2017-09-10 DIAGNOSIS — J029 Acute pharyngitis, unspecified: Secondary | ICD-10-CM | POA: Diagnosis not present

## 2017-09-10 DIAGNOSIS — Z6822 Body mass index (BMI) 22.0-22.9, adult: Secondary | ICD-10-CM | POA: Diagnosis not present

## 2017-09-10 DIAGNOSIS — R05 Cough: Secondary | ICD-10-CM | POA: Diagnosis not present

## 2017-09-10 DIAGNOSIS — H6121 Impacted cerumen, right ear: Secondary | ICD-10-CM | POA: Diagnosis not present

## 2017-09-16 ENCOUNTER — Encounter: Payer: Self-pay | Admitting: Neurology

## 2017-09-17 ENCOUNTER — Ambulatory Visit: Payer: Medicare HMO | Admitting: Neurology

## 2017-09-17 ENCOUNTER — Encounter: Payer: Self-pay | Admitting: Neurology

## 2017-09-17 VITALS — BP 119/62 | HR 53 | Ht 70.0 in | Wt 160.0 lb

## 2017-09-17 DIAGNOSIS — G4719 Other hypersomnia: Secondary | ICD-10-CM

## 2017-09-17 DIAGNOSIS — R42 Dizziness and giddiness: Secondary | ICD-10-CM | POA: Diagnosis not present

## 2017-09-17 DIAGNOSIS — I25701 Atherosclerosis of coronary artery bypass graft(s), unspecified, with angina pectoris with documented spasm: Secondary | ICD-10-CM

## 2017-09-17 DIAGNOSIS — R5383 Other fatigue: Secondary | ICD-10-CM

## 2017-09-17 DIAGNOSIS — R001 Bradycardia, unspecified: Secondary | ICD-10-CM

## 2017-09-17 DIAGNOSIS — R0683 Snoring: Secondary | ICD-10-CM | POA: Diagnosis not present

## 2017-09-17 NOTE — Progress Notes (Signed)
SLEEP MEDICINE CLINIC   Provider:  Larey Seat, M D  Primary Care Physician:  Marton Redwood, MD   Referring Provider: Marton Redwood, MD    Chief Complaint  Patient presents with  . New Patient (Initial Visit)    pt alone, rm 10. pt states that no matter how long he sleeps, he still feels tired during the day. pt states he knows he snores in sleep unaware if he has apnea. no sleep study    HPI:  Ian Cummings is a 84 y.o. male , seen here in a referral from Dr. Brigitte Pulse for a sleep consultation, for excessive daytime sleepiness.  Ian Cummings is a 84 year old right-handed Caucasian gentleman who reports having lost his energy over the last 8 months.  He has been for the whole time on blood pressure correcting medication, beta-blockers were chosen, 3 of them were tried.  He thinks that some of his fatigue developed after he had been for years already on  Atenolol, which finally gave him bradycardia. He was changed to other medications- now he is tired after waking up and having breakfast , dozes off while he is reading the morning paper. Chief complaint according to patient :"I have always had loads of energy but since October 2018 I lost it- I am fatigued" ; "I have a slow heart beat ",  He was on atenolol and became bradycardic in the 40's , changed to Bystolic and Metoprolol.  He now had low systolic pressures under 130 mm Hg, get's dizzy and lightheaded. After breakfast I need my easy chair and sleep again- I was never this way. He reports 4 nocturias nightly.   Sleep habits are as follows: The patient usually watches TV before he goes to bed, he will be in bed by about 10 PM to 11 PM.  He promptly can go to sleep but he may not stay asleep for long as he has developed frequent nocturia, up to 4 times at night.  He rises between 630 and 7 AM.  He reports dreaming frequently, vividly and about his work.  He is longtime retired.  He describes his bedroom is cool, quiet and dark, he sleeps with  one pillow on a flat mattress. He sleeps on his side.  Family members and especially his wife has told him that he snores, and for this reason his wife sleeps in a different bedroom.  He estimates on average 6 hours of nocturnal sleep may be even more.  But as he told his before he will feel right after breakfast when at rest sleepy enough to doze off.  He routinely also takes LDAP after lunch which lasts about 60 minutes or more.  He does not believe he dreams during his nap time.  He does not fall asleep in front of the TV before going to bed.   Sleep medical history and family sleep history: history of medical condition- see list below : The patient had surgery for right carotid stenosis, a stent was placed in February 2013 and he has followed [facility] vascular surgeons.  He had coronary artery disease status post open heart surgery with CABG and then in 2014 had additional cardiac stents placed as to grafts had occluded.  He had a redo of CABG in June 2014 and he had developed angina again in March 2018.  History of hypertension, hyperlipidemia IVIG, gout, acid reflux, he has an abdominal aortic aneurysm screen which was negative. Has Peripheral neuropathy , claudication and peripheral  vascular disease is followed by Dr. Trula Slade he had an angioplasty with ilio-bypass in August 2017.  Father loved to be 53 , CAD.PAD, mother died at 77 sudden onset- suspected of stroke/ no autopsy. Son has sleep apnea on CPAP.    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  [employer] . Non smoker ( smoked 20 years [phone]), drinks a glass of wine 3 nights a week. Caffeine - coffee 50/50 ( caffeinated and decaffeinated)  6 cups.  No soda , no iced tea. 2 sons, one schizoaffective disorder, one OSA, healthy. Dependent son is 8.   Review of Systems: Out of a complete 14 system review, the patient complains of only the following symptoms, and all other reviewed systems are  negative.  Snoring very loudly, apneas. Nasal speech, sinus congestion. Post nasal drip.   Epworth score  7 , Fatigue severity score 50  , depression score 1/ 15 - " I am not depressed, nor anxious"    Social History   Socioeconomic History  . Marital status: Married    Spouse name: Not on file  . Number of children: 3  . Years of education: Not on file  . Highest education level: Not on file  Occupational History  . 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 Financial resource strain: Not on file  . Food insecurity:    Worry: Not on file    Inability: Not on file  . Transportation needs:    Medical: Not on file    Non-medical: Not on file  Tobacco Use  . Smoking status: Former Smoker    Types: Cigarettes    Last attempt to quit: 04/29/1975    Years since quitting: 42.4  . Smokeless tobacco: Never Used  Substance and Sexual Activity  . Alcohol use: Yes    Comment: Occasional drink  . Drug use: No  . Sexual activity: Not Currently  Lifestyle  . Physical activity:    Days per week: Not on file    Minutes per session: Not on file  . Stress: Not on file  Relationships  . Social connections:    Talks on phone: Not on file    Gets together: Not on file    Attends religious service: Not on file    Active member of club or organization: Not on file    Attends meetings of clubs or organizations: Not on file    Relationship status: Not on file  . Intimate partner violence:    Fear of current or ex partner: Not on file    Emotionally abused: Not on file    Physically abused: Not on file    Forced sexual activity: Not on file  Other Topics Concern  . 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 Heart disease Mother        After 85 yrs of age  . Heart attack Mother   . Hyperlipidemia Father   . Hypertension Father   . Heart disease Father        After 75 yrs of age  . Diabetes Father   .  Stroke Sister   . Colon cancer Neg Hx   . Stomach cancer Neg Hx   . Rectal cancer Neg Hx   . Esophageal cancer Neg Hx   . Liver disease Neg Hx   . Kidney disease Neg Hx  Past Medical History:  Diagnosis Date  . AAA (abdominal aortic aneurysm) ([place])    2004, Pt stated not there now  . Anginal pain ([place])   . Anxiety   . Arthritis   . BPH (benign prostatic hypertrophy)   . CAD (coronary artery disease)   . Carotid artery disease ([place])    Right carotid stent 2013  . Colon polyps    adenomatous  . Depression   . GERD (gastroesophageal reflux disease)   . Gout   . H/O hiatal hernia   . Hyperlipidemia   . Hypertension   . Hypothyroidism   . MI (myocardial infarction) ([place]) 4/14  . Neuropathy    Hx: of in B/L toes  . Peripheral vascular disease ([place])    with claudication  . Pre-diabetes   . Stroke ([place])   . TIA (transient ischemic attack)    Hx: of    Past Surgical History:  Procedure Laterality Date  . CAROTID STENT INSERTION Right 2013   right at [facility]  . CATARACT EXTRACTION Bilateral    Hx: of both eyes  . COLONOSCOPY     Hx: of  . COLONOSCOPY    . CORONARY ANGIOPLASTY WITH STENT PLACEMENT  2010  . CORONARY ARTERY BYPASS GRAFT  1995  . CORONARY ARTERY BYPASS GRAFT N/A 10/18/2012   Procedure: REDO CORONARY ARTERY BYPASS GRAFTING (CABG);  Surgeon: Grace Isaac, MD;  Location: [place];  Service: Open Heart Surgery;  Laterality: N/A;  off pump times two using endoscopically harvested left saphenous vein  . CORONARY STENT PLACEMENT  06/2016  . ENDARTERECTOMY FEMORAL Right 08/03/2015   Procedure: RIGHT FEMORAL ENDARTERECTOMY WITH PATCH ANGIOPLASTY;  Surgeon: Serafina Mitchell, MD;  Location: [place];  Service: Vascular;  Laterality: Right;  . FEMORAL-POPLITEAL BYPASS GRAFT Right 08/03/2015   Procedure: RIGHT FEMORAL-BELOW KNEE POPLITEAL ARTERY BYPASS GRAFT;  Surgeon: Serafina Mitchell, MD;  Location: [place];  Service: Vascular;  Laterality: Right;  . FOOT SURGERY  Left   . FRACTURE SURGERY Left    Hx: of Left heel  . INGUINAL HERNIA REPAIR Bilateral    X 2   . INTRAOPERATIVE TRANSESOPHAGEAL ECHOCARDIOGRAM N/A 10/18/2012   Procedure: INTRAOPERATIVE TRANSESOPHAGEAL ECHOCARDIOGRAM;  Surgeon: Grace Isaac, MD;  Location: [place];  Service: Open Heart Surgery;  Laterality: N/A;  . LEFT HEART CATHETERIZATION WITH CORONARY/GRAFT ANGIOGRAM N/A 02/02/2013   Procedure: LEFT HEART CATHETERIZATION WITH Beatrix Fetters;  Surgeon: Jacolyn Reedy, MD;  Location: [facility] CATH LAB;  Service: Cardiovascular;  Laterality: N/A;  . LOWER EXTREMITY ANGIOGRAM Right 06/27/2015   Procedure: Lower Extremity Angiogram;  Surgeon: Serafina Mitchell, MD;  Location: [place] CV LAB;  Service: Cardiovascular;  Laterality: Right;  . MASTOIDECTOMY  1933  . PERIPHERAL VASCULAR CATHETERIZATION N/A 01/23/2015   Procedure: Abdominal Aortogram;  Surgeon: Serafina Mitchell, MD;  Location: [place] CV LAB;  Service: Cardiovascular;  Laterality: N/A;  . PERIPHERAL VASCULAR CATHETERIZATION N/A 06/27/2015   Procedure: Abdominal Aortogram;  Surgeon: Serafina Mitchell, MD;  Location: [place] CV LAB;  Service: Cardiovascular;  Laterality: N/A;  . RETINAL DETACHMENT SURGERY Left    Hx: of left eye  . TONSILLECTOMY      Current Outpatient Medications  Medication Sig Dispense Refill  . allopurinol (ZYLOPRIM) 100 MG tablet Take 100 mg by mouth daily.     Marland Kitchen amLODipine (NORVASC) 5 MG tablet Take by mouth.     Marland Kitchen aspirin EC 81 MG tablet Take 81 mg by mouth  2 (two) times daily.     . B Complex Vitamins (B COMPLEX 1 PO) Take 1 tablet by mouth daily.    . Cholecalciferol (VITAMIN D) 2000 units CAPS Take 1,000 Units by mouth daily.     . clopidogrel (PLAVIX) 75 MG tablet Take 75 mg by mouth daily.    . isosorbide mononitrate (IMDUR) 30 MG 24 hr tablet     . levothyroxine (SYNTHROID, LEVOTHROID) 88 MCG tablet Take 88 mcg by mouth daily before breakfast.    . lisinopril (PRINIVIL,ZESTRIL) 20 MG  tablet Take 20 mg by mouth daily.    . multivitamin (RENA-VIT) TABS tablet Take 1 tablet by mouth daily.    . nebivolol (BYSTOLIC) 10 MG tablet Take 10 mg by mouth daily.    Marland Kitchen omeprazole (PRILOSEC) 20 MG capsule Take 20 mg by mouth daily.    Marland Kitchen PARoxetine (PAXIL) 20 MG tablet Take 20 mg by mouth every morning.    Marland Kitchen RANEXA 500 MG 12 hr tablet Take 500 mg by mouth 2 (two) times daily.     . rosuvastatin (CRESTOR) 40 MG tablet Take 40 mg by mouth daily.     No current facility-administered medications for this visit.     Allergies as of 09/17/2017 - Review Complete 07/24/2017  Allergen Reaction Noted  . Amoxicillin Rash 02/11/2016  . Metoprolol Itching 12/20/2012    Vitals: BP 119/62   Pulse (!) 53   Ht 5\' 10"  (1.778 m)   Wt 160 lb (72.6 kg)   BMI 22.96 kg/m  Last Weight:  Wt Readings from Last 1 Encounters:  09/17/17 160 lb (72.6 kg)   ATF:TDDU mass index is 22.96 kg/m.     Last Height:   Ht Readings from Last 1 Encounters:  09/17/17 5\' 10"  (1.778 m)    Physical exam:  General: The patient is awake, alert and appears not in acute distress. Not shaven.  Head: Normocephalic, atraumatic. Neck is supple. Mallampati 3,  neck circumference:16. Nasal airflow congested, Retrognathia is seen.  Cardiovascular:  Regular rate and rhythm , without  murmurs or carotid bruit, and without distended neck veins. Respiratory: Lungs are clear to auscultation. Skin:  Without evidence of edema, or rash Trunk:    Neurologic exam : The patient is awake and alert, oriented to place and time.   Memory subjective described as intact.  Attention span & concentration ability appears normal.  Speech is fluent,  with  dysphonia.  Mood and affect are appropriate.  Cranial nerves: Pupils are equal and briskly reactive to light. Funduscopic exam without evidence of pallor or edema. Extraocular movements  in vertical and horizontal planes intact and without nystagmus. Visual fields by finger perimetry  are intact. Hearing to finger rub intact.  Facial sensation intact to fine touch.  Facial motor strength is symmetric and tongue and uvula move midline. Shoulder shrug was symmetrical.   Motor exam:   Normal tone, muscle bulk and symmetric strength in all extremities. He has a weak grip but strong inch, reports that arthritic pain hinders him to flex the middle 3 fingers.  Sensory:  Fine touch, pinprick and vibration were tested in all extremities. Proprioception tested in the upper extremities was normal. Coordination: Rapid alternating movements/ Finger-to-nose maneuver normal without evidence of ataxia, dysmetria or tremor. Gait and station: Patient walks without assistive device and is able unassisted to climb up to the exam table. Strength within normal limits.  Stance is stable and normal.  Deep tendon reflexes: in the  upper  and lower extremities are  intact. Crossed adductor reflex and brisk patella on the right.     Assessment:  After physical and neurologic examination, review of laboratory studies,  Personal review of imaging studies, reports of other /same  Imaging studies, results of polysomnography and / or neurophysiology testing and pre-existing records as far as provided in visit., my assessment is    Ian Cummings reports that he has no difficulties initiating sleep, but that his sleep is fragmented by nocturia.  His daytime sleepiness leads him to take a nap in the morning hours and regularly after lunch.  1)Nocturia   2)EDS with higher degree of fatigue - FSS 50 points. Not depressed.  This is in addition to about 6 to 7 hours of nocturnal sleep.  He has been known to snore so we will rule out or evaluate him for the presence of sleep apnea.    3) remote history of panic attacks. Not having symptoms for a long time.   4) CAD, PAD; Given a quite significant cardiac history and peripheral vascular disease history it is preferable for the patient to have an attended sleep study with  a split-night protocol.  I also wonder if the change between 2 beta-blockers may have promoted some of the daytime fatigue since the patient struggles with bradycardia but achieved a normalization of blood pressure with his new regimen.  The patient was advised of the nature of the diagnosed disorder , the treatment options and the  risks for general health and wellness arising from not treating the condition.   I spent more than 45 minutes of face to face time with the patient.  Greater than 50% of time was spent in counseling and coordination of care. We have discussed the diagnosis and differential and I answered the patient's questions.    Plan:  Treatment plan and additional workup :  SPLIT night AHI 20, watch for central apneas given above listed  morbidities.  Input from cardiology about beta blocker management.     Rv with MD   Larey Seat, MD 04/28/7508, 25:85 AM  Certified in Neurology by ABPN Certified in [place] by [facility] [address] [place], [place] [address]

## 2017-09-17 NOTE — Patient Instructions (Signed)
Hypersomnia Hypersomnia is when you feel extremely tired during the day even though you're getting plenty of sleep at night. You may need to take naps during the day, and you may also be extremely difficult to wake up when you are sleeping. What are the causes? The cause of your hypersomnia may not be known. Hypersomnia may be caused by:  Medicines.  Sleep disorders, such as narcolepsy.  Trauma or injury to your head or nervous system.  Using drugs or alcohol.  Tumors.  Medical conditions, such as depression or hypothyroidism.  Genetics.  What are the signs or symptoms? The main symptoms of hypersomnia include:  Feeling extremely tired throughout the day.  Being very difficult to wake up.  Sleeping for longer and longer periods.  Taking naps throughout the day.  Other symptoms may include:  Feeling: ? Restless. ? Annoyed. ? Anxious. ? Low energy.  Having difficulty: ? Remembering. ? Speaking. ? Thinking.  Losing your appetite.  Experiencing hallucinations.  How is this diagnosed? Hypersomnia may be diagnosed by:  Medical history and physical exam. This will include a sleep history.  Completing sleep logs.  Tests may also be done, such as: ? Polysomnography. ? Multiple sleep latency test (MSLT).  How is this treated? There is no cure for hypersomnia, but treatment can be very effective in helping manage the condition. Treatment may include:  Lifestyle and sleeping strategies to help cope with the condition.  Stimulant medicines.  Treating any underlying causes of hypersomnia.  Follow these instructions at home:  Take medicines only as directed by your health care provider.  Schedule short naps for when you feel sleepiest during the day. Tell your employer or teachers that you have hypersomnia. You may be able to adjust your schedule to include time for naps.  Avoid drinking alcohol or caffeinated beverages.  Do not eat a heavy meal before  bedtime. Eat at about the same times every day.  Do not drive or operate heavy machinery if you are sleepy.  Do not swim or go out on the water without a life jacket.  If possible, adjust your schedule so that you do not have to work or be active at night.  Keep all follow-up visits as directed by your health care provider. This is important. Contact a health care provider if:  You have new symptoms.  Your symptoms get worse. Get help right away if: You have serious thoughts of hurting yourself or someone else. This information is not intended to replace advice given to you by your health care provider. Make sure you discuss any questions you have with your health care provider. Document Released: 04/04/2002 Document Revised: 09/20/2015 Document Reviewed: 11/17/2013 Elsevier Interactive Patient Education  2018 [employer]. Sleep Studies A sleep study (polysomnogram) is a series of tests done while you are sleeping. It can show how well you sleep. This can help your health care provider diagnose a sleep disorder and show how severe your sleep disorder is. A sleep study may lead to treatment that will help you sleep better and prevent other medical problems caused by poor sleep. If you have a sleep disorder, you may also be at risk for:  Sleep-related accidents.  High blood pressure.  Heart disease.  Stroke.  Other medical conditions.  Sleep disorders are common. Your health care provider may suspect a sleep disorder if you:  Have loud snoring most nights.  Have brief periods when you stop breathing at night.  Feel sleepy on most days.  Fall asleep suddenly during the day.  Have trouble falling asleep or staying asleep.  Feel like you need to move your legs when trying to fall asleep.  Have dreams that seem very real shortly after falling asleep.  Feel like you cannot move when you first wake up.  Which tests will I need to have? Most sleep studies last all night  and include these tests:  Recordings of your brain activity.  Recordings of your eye movements.  Recording of your heart rate and rhythm.  Blood pressure readings.  Readings of the amount of oxygen in your blood.  Measurements of your chest and belly movement as you breathe during sleep.  If you have signs of the sleep disorder called sleep apnea during your test, you may get a mask to wear for the second half of the night.  The mask provides continuous positive airway pressure (CPAP). This may improve sleep apnea significantly.  You will then have all tests done again with the mask in place to see if your measurements and recordings change.  How are sleep studies done? Most sleep studies are done over one full night of sleep.  You will arrive at the study center in the evening and can go home in the morning.  Bring your pajamas and toothbrush.  Do not have caffeine on the day of your sleep study.  Your health care provider will let you know if you need to stop taking any of your regular medicines before the test.  To do the tests included in a polysomnogram, you will have:  Round, sticky patches with sensors attached to recording wires (electrodes) placed on your scalp, face, chest, and limbs.  Wires from all the electrodes and sensors run from your bed to a computer. The wires can be taken off and put back on if you need to get out of bed to go to the bathroom.  A sensor placed over your nose to measure airflow.  A finger clip put on one finger to measure your blood oxygen level.  A belt around your belly and a belt around your chest to measure breathing movements.  Where are sleep studies done? Sleep studies are done at sleep centers. A sleep center may be inside a hospital, office, or clinic. The room where you have the study may look like a hospital room or a hotel room. The health care providers doing the study may come in and out of the room during the study. Most  of the time, they will be in another room monitoring your test. How is information from sleep studies helpful? A polysomnogram can be used along with your medical history and a physical exam to diagnose conditions, such as:  Sleep apnea.  Restless legs syndrome.  Sleep-related seizure disorders.  Sleep-related movement disorders.  A medical doctor who specializes in sleep will evaluate your sleep study. The specialist will share the results with your primary health care provider. Treatments based on your sleep study may include:  Improving your sleep habits (sleep hygiene).  Wearing a CPAP mask.  Wearing an oral device at night to improve breathing and reduce snoring.  Taking medicine for: ? Restless legs syndrome. ? Sleep-related seizure disorder. ? Sleep-related movement disorder.  This information is not intended to replace advice given to you by your health care provider. Make sure you discuss any questions you have with your health care provider. Document Released: 10/19/2002 Document Revised: 12/09/2015 Document Reviewed: 06/20/2013 Elsevier Interactive Patient Education  2018  [employer].

## 2017-09-28 ENCOUNTER — Ambulatory Visit (INDEPENDENT_AMBULATORY_CARE_PROVIDER_SITE_OTHER): Payer: Medicare HMO | Admitting: Neurology

## 2017-09-28 DIAGNOSIS — R001 Bradycardia, unspecified: Secondary | ICD-10-CM

## 2017-09-28 DIAGNOSIS — G4731 Primary central sleep apnea: Secondary | ICD-10-CM | POA: Diagnosis not present

## 2017-09-28 DIAGNOSIS — R5383 Other fatigue: Secondary | ICD-10-CM

## 2017-09-28 DIAGNOSIS — R0683 Snoring: Secondary | ICD-10-CM

## 2017-09-28 DIAGNOSIS — G4719 Other hypersomnia: Secondary | ICD-10-CM

## 2017-09-28 DIAGNOSIS — I25701 Atherosclerosis of coronary artery bypass graft(s), unspecified, with angina pectoris with documented spasm: Secondary | ICD-10-CM

## 2017-09-28 DIAGNOSIS — G4739 Other sleep apnea: Secondary | ICD-10-CM

## 2017-09-28 DIAGNOSIS — R42 Dizziness and giddiness: Secondary | ICD-10-CM

## 2017-09-29 NOTE — Procedures (Signed)
PATIENT'S NAME:  Ian Cummings, Ian Cummings DOB:      1928/11/26      MR#:    [id number]     DATE OF RECORDING: 09/28/2017 REFERRING M.D.:  Assunta Curtis, M.D. Study Performed:   Baseline Polysomnogram HISTORY: Ian Cummings is a 84 year old male patient with excessive daytime sleepiness.   This 84 year old right-handed Caucasian gentleman reports having "lost his energy" over the last 8 months.  He has been on hypertension medication, three beat blockers were tried.  He thinks that some of his fatigue developed on Atenolol, which finally gave him bradycardia. He was changed to other medications- but now he is tired after waking up and having breakfast, he dozes off while he is reading the morning paper.  Chief complaint according to patient: "I have always had loads of energy- but since October 2018 I lost it- I am fatigued" ; "I have a slow heart beat ".  After he became bradycardic in the 40's , he was changed to Bystolic and finally Metoprolol.  He now had low systolic pressures under 300 mm Hg, gets very easily dizzy and lightheaded. " After breakfast I need my easy chair and sleep again-  I was never this way".  He reports dreaming frequently, vividly and about his work.  He describes his bedroom as cool, quiet and dark, his wife has told him that he snores, and for this reason his wife sleeps in a different bedroom. He estimates on average 6 hours of nocturnal sleep may be even more. He routinely naps after lunch, naps lasts about 60 minutes or more.  He does not believe he dreams during his nap time.  He does not fall asleep in front of the TV before going to bed.   AAA, Angina pectoris, Anxiety, Arthritis, BPH, CAD, Carotid artery disease, Colon polyps, Depression, GERD, Hiatal hernia, Hyperlipidemia, Hypertension, Hypothyroidism, Myocardial infarction, Neuropathy, Peripheral vascular disease, Stroke,  The patient endorsed the Epworth Sleepiness Scale at 7/24 points. FSS at 50 points.  The patient's  weight 160 pounds with a height of 70 (inches), resulting in a BMI of 23.1 kg/m2. The patient's neck circumference measured 16 inches.  CURRENT MEDICATIONS: Zyloprim, Norvasc, Aspirin, B complex, Vitamin D, Plavix, Imdur, Synthroid, Prinivil, Rena-vit, Bystolic, Prilosec, Paxil, Ranexa, Crestor.   PROCEDURE:  This is a multichannel digital polysomnogram utilizing the Somnostar 11.2 system.  Electrodes and sensors were applied and monitored per AASM Specifications.   EEG, EOG, Chin and Limb EMG, were sampled at 200 Hz.  ECG, Snore and Nasal Pressure, Thermal Airflow, Respiratory Effort, CPAP Flow and Pressure, Oximetry was sampled at 50 Hz. Digital video and audio were recorded.      BASELINE STUDY: Lights Out was at 22:19 and Lights On at 05:19.  Total recording time (TRT) was 421 minutes, with a total sleep time (TST) of 243.5 minutes.   The patient's sleep latency was 90.5 minutes.  REM latency was 129 minutes.  The sleep efficiency was poor at 57.8 %.     SLEEP ARCHITECTURE: WASO (Wake after sleep onset) was 94.5 minutes.  There were 12.5 minutes in Stage N1, 154.5 minutes Stage N2, 19.5 minutes Stage N3 and 57 minutes in Stage REM.  The percentage of Stage N1 was 5.1%, Stage N2 was 63.4%, percentage of Stage N3 sleep was 8.0%, and Stage R (REM sleep) was 23.4%.   RESPIRATORY ANALYSIS:  There were a total of 56 respiratory events:  7 obstructive apneas, 7 central apneas and 1  mixed apnea with 41 hypopneas and 0 respiratory event related arousals (RERAs).    The total APNEA/HYPOPNEA INDEX (AHI) was 13.8/hour and the total RESPIRATORY DISTURBANCE INDEX was 13.8 /hour.  23 events occurred in REM sleep and 51 events in NREM. The REM AHI was 24.2 /hour, versus a non-REM AHI of 10.6. The patient spent 17 minutes of total sleep time in the supine position and 227 minutes in non-supine. The supine AHI was 7.1 versus a non-supine AHI of 14.3.  OXYGEN SATURATION & C02:  The Wake baseline 02 saturation was  92%, with the lowest being 84%. Time spent below 89% saturation equaled 16 minutes.  PERIODIC LIMB MOVEMENTS:  The patient had a total of 0 Periodic Limb Movements.   The arousals were noted as: 52 were spontaneous, 0 were associated with PLMs, and 36 were associated with respiratory events. Nocturia times 2.  Audio and video analysis did not show any abnormal or unusual movements, behaviors, phonations or vocalizations. Snoring was noted. EKG was in keeping with normal sinus rhythm (NSR).  Post-study, the patient indicated that sleep was worse than usual and estimated having slept 2 hours only.   IMPRESSION: 1. Mild Complex Sleep Apnea (CSA) without hypoxemia. AHI was 13.8/h, in REM 24.2/h and not positional dependent.  2. No Periodic Limb Movement Disorder (PLMD) noted.  3. Moderate Primary Snoring. 4. Insomnia- sustained sleep after 1 AM.   RECOMMENDATIONS:  1. Advise full-night, attended, PAP titration study to optimize therapy. This complex apnea should not be treated with auto PAP.  2. I like for the patient to be using a nasal pillow, he may benefit from desensitization. 3. I recommend melatonin to be taken orally at 10-11 Pm , to help extend sleep time.     I certify that I have reviewed the entire raw data recording prior to the issuance of this report in accordance with the Standards of Accreditation of the American Academy of Sleep Medicine (AASM)   Larey Seat, MD    09-29-2017  Diplomat, American Board of Psychiatry and Neurology  Diplomat, American Board of [place] Director, [facility] [employer]

## 2017-09-30 ENCOUNTER — Telehealth: Payer: Self-pay

## 2017-09-30 NOTE — Telephone Encounter (Signed)
-----   Message from Larey Seat, MD sent at 09/29/2017  6:14 PM EDT ----- Mild but complex apnea and loud snoring- Mr. Stayer does not wake from his snoring, but from apnea.  IMPRESSION: 1. Mild Complex Sleep Apnea (CSA) without hypoxemia. AHI was  13.8/h, in REM 24.2/h and not positional dependent.  2. No Periodic Limb Movement Disorder (PLMD) noted.  3. Moderate Primary Snoring. 4. Insomnia- sustained sleep after 1 AM.   RECOMMENDATIONS:  1. Advise full-night, attended, PAP titration study to optimize  therapy. This complex apnea should not be treated with auto PAP.  2. I like for the patient to be using a nasal pillow, he may  benefit from desensitization. 3. I recommend melatonin to be taken orally at 10-11 Pm , to help  extend sleep time.

## 2017-09-30 NOTE — Telephone Encounter (Signed)
Patient is returning your call.  

## 2017-09-30 NOTE — Telephone Encounter (Signed)
I called pt to discuss his sleep study results. No answer, left a message asking him to call me back. 

## 2017-09-30 NOTE — Telephone Encounter (Signed)
I called pt again to discuss. No answer, left a message asking him to call me back. 

## 2017-09-30 NOTE — Telephone Encounter (Signed)
Pt returning RNs call, will be leaving soon would like a call on mobile #

## 2017-09-30 NOTE — Telephone Encounter (Signed)
Pt called back. I called pt. I advised pt that Dr. Brett Fairy reviewed their sleep study results and found that has mild complex sleep apnea and recommends that pt be treated with a cpap. Dr. Brett Fairy recommends that pt return for a repeat sleep study in order to properly titrate the cpap and ensure a good mask fit. Pt is agreeable to returning for a titration study. I advised pt that our sleep lab will file with pt's insurance and call pt to schedule the sleep study when we hear back from the pt's insurance regarding coverage of this sleep study. I also recommended that pt try melatonin by mouth OTC at 10-11pm to help extend his sleep time. Pt verbalized understanding of results. Pt had no questions at this time but was encouraged to call back if questions arise.

## 2017-10-06 DIAGNOSIS — J189 Pneumonia, unspecified organism: Secondary | ICD-10-CM | POA: Diagnosis not present

## 2017-10-20 ENCOUNTER — Ambulatory Visit (INDEPENDENT_AMBULATORY_CARE_PROVIDER_SITE_OTHER): Payer: Medicare HMO | Admitting: Neurology

## 2017-10-20 DIAGNOSIS — G4731 Primary central sleep apnea: Secondary | ICD-10-CM | POA: Diagnosis not present

## 2017-10-20 DIAGNOSIS — R0683 Snoring: Secondary | ICD-10-CM

## 2017-10-20 DIAGNOSIS — R5383 Other fatigue: Secondary | ICD-10-CM

## 2017-10-20 DIAGNOSIS — G4719 Other hypersomnia: Secondary | ICD-10-CM

## 2017-10-20 DIAGNOSIS — R001 Bradycardia, unspecified: Secondary | ICD-10-CM

## 2017-10-20 DIAGNOSIS — I25701 Atherosclerosis of coronary artery bypass graft(s), unspecified, with angina pectoris with documented spasm: Secondary | ICD-10-CM

## 2017-10-20 DIAGNOSIS — R42 Dizziness and giddiness: Secondary | ICD-10-CM

## 2017-10-21 ENCOUNTER — Ambulatory Visit (INDEPENDENT_AMBULATORY_CARE_PROVIDER_SITE_OTHER): Payer: Medicare HMO | Admitting: Orthopaedic Surgery

## 2017-10-21 ENCOUNTER — Ambulatory Visit (INDEPENDENT_AMBULATORY_CARE_PROVIDER_SITE_OTHER): Payer: Self-pay

## 2017-10-21 ENCOUNTER — Encounter (INDEPENDENT_AMBULATORY_CARE_PROVIDER_SITE_OTHER): Payer: Self-pay | Admitting: Orthopaedic Surgery

## 2017-10-21 DIAGNOSIS — M25512 Pain in left shoulder: Secondary | ICD-10-CM

## 2017-10-21 MED ORDER — LIDOCAINE HCL 1 % IJ SOLN
3.0000 mL | INTRAMUSCULAR | Status: AC | PRN
  Administered 2017-10-21: 3 mL

## 2017-10-21 MED ORDER — METHYLPREDNISOLONE ACETATE 40 MG/ML IJ SUSP
40.0000 mg | INTRAMUSCULAR | Status: AC | PRN
  Administered 2017-10-21: 40 mg via INTRA_ARTICULAR

## 2017-10-21 NOTE — Progress Notes (Signed)
Office Visit Note   Patient: Ian Cummings           Date of Birth: 1928/11/01           MRN: [id number] Visit Date: 10/21/2017              Requested by: Marton Redwood, MD [address] [place], [place] [address] PCP: Marton Redwood, MD   Assessment & Plan: Visit Diagnoses:  1. Acute pain of left shoulder     Plan: I do feel that this is impingement syndrome of the left shoulder and he would do well with subacromial steroid injection.  He agreed to try this as well given his age of 84 years old.  He will avoid heavy lifting with that shoulder for just the near future.  All question concerns were answered and addressed.  Follow-up will be as needed.  Follow-Up Instructions: Return if symptoms worsen or fail to improve.   Orders:  Orders Placed This Encounter  Procedures  . Large Joint Inj: L subacromial bursa  . XR Shoulder Left   No orders of the defined types were placed in this encounter.     Procedures: Large Joint Inj: L subacromial bursa on 10/21/2017 1:19 PM Indications: pain and diagnostic evaluation Details: 22 G 1.5 in needle  Arthrogram: No  Medications: 3 mL lidocaine 1 %; 40 mg methylPREDNISolone acetate 40 MG/ML Outcome: tolerated well, no immediate complications Procedure, treatment alternatives, risks and benefits explained, specific risks discussed. Consent was given by the patient. Immediately prior to procedure a time out was called to verify the correct patient, procedure, equipment, support staff and site/side marked as required. Patient was prepped and draped in the usual sterile fashion.       Clinical Data: No additional findings.   Subjective: Chief Complaint  Patient presents with  . Left Shoulder - Follow-up  The patient comes in today with acute left shoulder pain is been hurting for about a month now.  He is 84 year old gentleman is very active.  He does not recall a significant injury or specific time that he injured his shoulder but  he does a lot of outdoor activities and work as well.  It hurts mainly with trying to reach overhead which he is having some difficulty doing and reaching behind him.  He denies any neck issues.  He denies any numbness and tingling in the left hand.  HPI  Review of Systems He currently denies any headache, chest pain, shortness of breath, fever, chills, nausea, vomiting.  He is alert and oriented x3 and in no acute distress  Objective: Vital Signs: There were no vitals taken for this visit.  Physical Exam He is alert and oriented x3 and in no acute distress Ortho Exam Examination of his left shoulder shows full range of motion but it is painful past 90 degrees of abduction.  He is using his rotator cuff to abduct and externally rotate his shoulder with no deficits on exam of the rotator cuff itself.  He does have positive Neer and Hawkins signs.  There are no limitations in his motion. Specialty Comments:  No specialty comments available.  Imaging: Xr Shoulder Left  Result Date: 10/21/2017 3 views of left shoulder show no acute findings.  The shoulder is well located.  There is good space the glenohumeral joint and subacromial joint.  The acromioclavicular joint appears normal as well.    PMFS History: Patient Active Problem List   Diagnosis Date Noted  . Malnutrition  of moderate degree 08/05/2015  . PAD (peripheral artery disease) ([place]) 08/03/2015  . Atherosclerotic PVD with intermittent claudication ([place]) 02/20/2014  . Aftercare following surgery of the circulatory system, [place] 12/21/2013  . Hypothyroidism   . CAD (coronary artery disease) 09/06/2012  . s/p CABG   . Hyperlipidemia   . Essential hypertension   . Gout   . BPH (benign prostatic hypertrophy)   . 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 Carotid artery disease without cerebral infarction [facility])    Past Medical History:  Diagnosis Date  . AAA (abdominal  aortic aneurysm) ([place])    2004, Pt stated not there now  . Anginal pain ([place])   . Anxiety   . Arthritis   . BPH (benign prostatic hypertrophy)   . CAD (coronary artery disease)   . Carotid artery disease ([place])    Right carotid stent 2013  . Colon polyps    adenomatous  . Depression   . GERD (gastroesophageal reflux disease)   . Gout   . H/O hiatal hernia   . Hyperlipidemia   . Hypertension   . Hypothyroidism   . MI (myocardial infarction) ([place]) 4/14  . Neuropathy    Hx: of in B/L toes  . Peripheral vascular disease ([place])    with claudication  . Pre-diabetes   . Stroke ([place])   . TIA (transient ischemic attack)    Hx: of    Family History  Problem Relation Age of Onset  . Heart disease Mother        After 43 yrs of age  . Heart attack Mother   . Hyperlipidemia Father   . Hypertension Father   . Heart disease Father        After 56 yrs of age  . Diabetes Father   . Stroke Sister   . Colon cancer Neg Hx   . Stomach cancer Neg Hx   . Rectal cancer Neg Hx   . Esophageal cancer Neg Hx   . Liver disease Neg Hx   . Kidney disease Neg Hx     Past Surgical History:  Procedure Laterality Date  . CAROTID STENT INSERTION Right 2013   right at [facility]  . CATARACT EXTRACTION Bilateral    Hx: of both eyes  . COLONOSCOPY     Hx: of  . COLONOSCOPY    . CORONARY ANGIOPLASTY WITH STENT PLACEMENT  2010  . CORONARY ARTERY BYPASS GRAFT  1995  . CORONARY ARTERY BYPASS GRAFT N/A 10/18/2012   Procedure: REDO CORONARY ARTERY BYPASS GRAFTING (CABG);  Surgeon: Grace Isaac, MD;  Location: [place];  Service: Open Heart Surgery;  Laterality: N/A;  off pump times two using endoscopically harvested left saphenous vein  . CORONARY STENT PLACEMENT  06/2016  . ENDARTERECTOMY FEMORAL Right 08/03/2015   Procedure: RIGHT FEMORAL ENDARTERECTOMY WITH PATCH ANGIOPLASTY;  Surgeon: Serafina Mitchell, MD;  Location: [place];  Service: Vascular;  Laterality: Right;  . FEMORAL-POPLITEAL BYPASS  GRAFT Right 08/03/2015   Procedure: RIGHT FEMORAL-BELOW KNEE POPLITEAL ARTERY BYPASS GRAFT;  Surgeon: Serafina Mitchell, MD;  Location: [place];  Service: Vascular;  Laterality: Right;  . FOOT SURGERY Left   . FRACTURE SURGERY Left    Hx: of Left heel  . INGUINAL HERNIA REPAIR Bilateral    X 2   . INTRAOPERATIVE TRANSESOPHAGEAL ECHOCARDIOGRAM N/A 10/18/2012   Procedure: INTRAOPERATIVE TRANSESOPHAGEAL ECHOCARDIOGRAM;  Surgeon: Grace Isaac, MD;  Location: [facility];  Service: Open  Heart Surgery;  Laterality: N/A;  . LEFT HEART CATHETERIZATION WITH CORONARY/GRAFT ANGIOGRAM N/A 02/02/2013   Procedure: LEFT HEART CATHETERIZATION WITH Beatrix Fetters;  Surgeon: Jacolyn Reedy, MD;  Location: [facility] CATH LAB;  Service: Cardiovascular;  Laterality: N/A;  . LOWER EXTREMITY ANGIOGRAM Right 06/27/2015   Procedure: Lower Extremity Angiogram;  Surgeon: Serafina Mitchell, MD;  Location: [facility] CV LAB;  Service: Cardiovascular;  Laterality: Right;  . MASTOIDECTOMY  1933  . PERIPHERAL VASCULAR CATHETERIZATION N/A 01/23/2015   Procedure: Abdominal Aortogram;  Surgeon: Serafina Mitchell, MD;  Location: [place] CV LAB;  Service: Cardiovascular;  Laterality: N/A;  . PERIPHERAL VASCULAR CATHETERIZATION N/A 06/27/2015   Procedure: Abdominal Aortogram;  Surgeon: Serafina Mitchell, MD;  Location: [place] CV LAB;  Service: Cardiovascular;  Laterality: N/A;  . RETINAL DETACHMENT SURGERY Left    Hx: of left eye  . TONSILLECTOMY     Social History   Occupational History  . Occupation: retired  Tobacco Use  . Smoking status: Former Smoker    Types: Cigarettes    Last attempt to quit: 04/29/1975    Years since quitting: 42.5  . Smokeless tobacco: Never Used  Substance and Sexual Activity  . Alcohol use: Yes    Comment: Occasional drink  . Drug use: No  . Sexual activity: Not Currently

## 2017-11-11 ENCOUNTER — Telehealth: Payer: Self-pay | Admitting: Neurology

## 2017-11-11 ENCOUNTER — Encounter: Payer: Self-pay | Admitting: Neurology

## 2017-11-11 DIAGNOSIS — R001 Bradycardia, unspecified: Secondary | ICD-10-CM | POA: Insufficient documentation

## 2017-11-11 DIAGNOSIS — G4731 Primary central sleep apnea: Secondary | ICD-10-CM

## 2017-11-11 DIAGNOSIS — G4739 Other sleep apnea: Secondary | ICD-10-CM

## 2017-11-11 HISTORY — DX: Other sleep apnea: G47.39

## 2017-11-11 HISTORY — DX: Primary central sleep apnea: G47.31

## 2017-11-11 HISTORY — DX: Bradycardia, unspecified: R00.1

## 2017-11-11 NOTE — Telephone Encounter (Signed)
Called patient to discuss sleep study results. No answer at this time. LVM for the patient to call back.   

## 2017-11-11 NOTE — Addendum Note (Signed)
Addended by: Larey Seat on: 11/11/2017 09:06 AM   Modules accepted: Orders

## 2017-11-11 NOTE — Telephone Encounter (Signed)
-----   Message from Larey Seat, MD sent at 11/11/2017  9:06 AM EDT ----- DIAGNOSIS : Complex Sleep Apnea responding to CPAP between 10 and  13 cm water pressure, some central apneas arose under CPAP  treatment. The patient was fitted with an ESON nasal mask in  large size Research officer, political party and Paykel) and a chin strap. Bradycardia was not evident, Nocturia was not evident. No  clinically significant hypoxemia.  NREM sleep and supine sleep accentuated the AHI. Central apneas  emerged.    PLANS/RECOMMENDATIONS: 1. Follow-up with [facility] sleep clinic after 60-90 days on auto CPAP  with a 3 cm EPR.  2. Avoid spine sleep. 3. Continue taking Melatonin at 5 mg or less each night if a  benefit to sleep latency and duration is appreciated by the  patient. 4. Educate patient in sleep hygiene measures.  5. The patient should avoid evening sedatives, hypnotics, and  alcohol beverage consumption

## 2017-11-11 NOTE — Procedures (Signed)
PATIENT'S NAME:  Ian Cummings, Ian Cummings DOB:      [birth year]      MR#:    [id number]     DATE OF RECORDING: 10/20/2017 REFERRING M.D.:  Ian Kanner, MD Study Performed:   Titration to positive airway pressure [name] is a pleasant, alert appearing 84 year old Caucasian male patient with reported high level fatigue, frequent nocturia and bradycardia. History of PNP, coronary vascular disease, and CKD, illio-femoral bypass.  The preceding PSG from 09-29-2017 at [facility] sleep at [facility] revealed; 1. Mild Complex Sleep Apnea (CSA) without prolonged hypoxemia. AHI was 13.8/h, REM AHI was 24.2/h and not positional dependent. Moderate Primary Snoring noted.  Insomnia- sustained sleep later than after 1 AM. Heart rate between 49 and 58 bpm.  Patient returning for a CPAP Titration sleep study scored at 4%, after having an oxygen nadir of 84%  The patient endorsed the Epworth Sleepiness Scale at 7/24 points and the Fatigue Score at 50/63 points.    CURRENT MEDICATIONS: Zyloprim, Norvasc, Aspirin, B complex, Vitamin D, Plavix, ImDur, Synthroid, Prinivil, Renal-vitamin, Bystolic, Prilosec, Paxil, Ranexa, Crestor.   PROCEDURE:  This is a multichannel digital polysomnogram utilizing the SomnoStar 11.2 system.  Electrodes and sensors were applied and monitored per AASM Specifications.   EEG, EOG, Chin and Limb EMG, were sampled at 200 Hz.  ECG, Snore and Nasal Pressure, Thermal Airflow, Respiratory Effort, CPAP Flow and Pressure, Oximetry was sampled at 50 Hz. Digital video and audio were recorded.      CPAP was initiated at 5 cmH20 with heated humidity per AASM split night standards and pressure was advanced to 103cmH20 because of hypopneas, apneas and desaturations.  At a PAP pressure of 13 cmH20, there was a reduction of the AHI to 1.9, SpO2 nadir of 89% and 32 minutes of sleep time with89% sleep efficiency, and notable improvement of obstructive sleep apnea. Lights Out was at 20:56 and Lights On at 04:56. Total  recording time (TRT) was 481 minutes, with a total sleep time (TST) of 353.5 minutes. The patient's sleep latency was still long at 69 minutes. REM latency was 112.5 minutes. The sleep efficiency was 73.5 %.    SLEEP ARCHITECTURE: WASO (Wake after sleep onset) was 58 minutes.  There were 9 minutes in Stage N1, 247.5 minutes Stage N2, 11 minutes Stage N3 and 86 minutes in Stage REM.  The percentage of Stage N1 was 2.5%, Stage N2 was 70.%, Stage N3 was 3.1% and Stage R (REM sleep) was 24.3%. The sleep architecture was notable for REM sleep rebounding under CPAP.  RESPIRATORY ANALYSIS:  There was a total of 55 respiratory events: 0 obstructive apneas, 26 central apneas and 29 hypopneas with 0 respiratory event related arousals (RERAs). The total APNEA/HYPOPNEA INDEX (AHI) was 9.3 /hour and the total RESPIRATORY DISTURBANCE INDEX was 9.3/hour.   6 events occurred in REM sleep and 49 events in NREM. The REM AHI was 4.2 /hour versus a non-REM AHI of 11.0/hour.  The patient spent 75.5 minutes of total sleep time in the supine position and 278 minutes in non-supine. The supine AHI was 25.4/h, versus a non-supine AHI of 4.9. REM sleep was reached under 7, 8, 9 and 13 cm water pressure.  OXYGEN SATURATION & C02:  The baseline 02 saturation was 96%, with the lowest being 87%. Time spent below 89% saturation equaled 1 minute.  PERIODIC LIMB MOVEMENTS:  The patient had a total of 0 Periodic Limb Movements. The arousals were noted as: 83 were spontaneous,  0 were associated with PLMs, and only 10 were associated with respiratory events. Audio and video analysis did not show any abnormal or unusual movements, behaviors, phonations or vocalizations.  The patient took one bathroom break around midnight. Snoring resolved. EKG was in keeping with normal sinus rhythm (NSR) at averaged 56 bpm. The patient was fitted with an ESON nasal mask in large size ([employer]) and a chin strap.  DIAGNOSIS : Complex Sleep Apnea  responding to CPAP between 10 and 13 cm water pressure, some central apneas arose under CPAP treatment. The patient was fitted with an ESON nasal mask in large size Research officer, political party and Paykel) and a chin strap.  Bradycardia was not evident, Nocturia was not evident. No clinically significant hypoxemia.  NREM sleep and supine sleep accentuated the AHI. Central apneas emerged.    PLANS/RECOMMENDATIONS: 1. Follow-up with [facility] sleep clinic after 60-90 days on auto CPAP with a 3 cm EPR.  2. Avoid spine sleep. 3. Continue taking Melatonin at 5 mg or less each night if a benefit to sleep latency and duration is appreciated by the patient. 4. Educate patient in sleep hygiene measures.   5. The patient should avoid evening sedatives, hypnotics, and alcohol beverage consumption  DISCUSSION: I will order an auto titration CPAP device with a pressure range from 7 cm through 15 cm water pressure, EPR or C flex of 2 cm water. The patient was fitted with an ESON nasal mask in large size Research officer, political party and Paykel) and a chin strap.  The chin strap is optional.  A follow up appointment will be scheduled in the Sleep Clinic at [facility].   Please call [phone] with any questions.      I certify that I have reviewed the entire raw data recording prior to the issuance of this report in accordance with the Standards of Accreditation of the American Academy of Sleep Medicine (AASM)    Larey Seat, M.D.   11-11-2017  Diplomat, American Board of Psychiatry and Neurology  Diplomat, [place] [facility] Medical Director, [place] [facility] [employer]

## 2017-11-13 ENCOUNTER — Encounter: Payer: Self-pay | Admitting: *Deleted

## 2017-11-13 NOTE — Telephone Encounter (Signed)
Pt has returned the call for the results of his sleep study.  Pt is asking for a call back with results

## 2017-11-13 NOTE — Telephone Encounter (Addendum)
Spoke with pt regarding his sleep study results. Discussed that he has both central and obstructive sleep apnea per Dr. Brett Fairy and she has ordered CPAP. We will send the information to [employer] which will be his supply company for the CPAP and will be a great resource for the management and cleaning of the machine. RN discussed good sleep hygiene measures including avoiding caffeine in the afternoon, avoiding television or noises at night, etc. Also advised pt to avoid sleeping on his back at night and to avoid evening sedatives, hypnotics, and alcohol beverage consumption. Pt also advised that for insurance purposes he will need to use his CPAP at least 4 hours every night. We also need to see him in about 3 months for a f/u. RN scheduled pt on Wed 02/17/18 with Dr. Brett Fairy and advised pt to bring his machine with him. Information will be sent to him in the mail. Pt verbalized understanding and appreciation of all and is aware that [employer] will call him within a week to setup the appt at his home.  Letter written & mailed to pt.  Faxed order, demographics, office note, and sleep studies to [employer]. Received a receipt of confirmation.

## 2017-11-24 DIAGNOSIS — G4733 Obstructive sleep apnea (adult) (pediatric): Secondary | ICD-10-CM | POA: Diagnosis not present

## 2017-12-17 ENCOUNTER — Encounter (INDEPENDENT_AMBULATORY_CARE_PROVIDER_SITE_OTHER): Payer: Self-pay | Admitting: Orthopaedic Surgery

## 2017-12-17 ENCOUNTER — Ambulatory Visit (INDEPENDENT_AMBULATORY_CARE_PROVIDER_SITE_OTHER): Payer: Medicare HMO | Admitting: Orthopaedic Surgery

## 2017-12-17 DIAGNOSIS — M7542 Impingement syndrome of left shoulder: Secondary | ICD-10-CM | POA: Diagnosis not present

## 2017-12-17 DIAGNOSIS — M25512 Pain in left shoulder: Secondary | ICD-10-CM | POA: Diagnosis not present

## 2017-12-17 NOTE — Progress Notes (Signed)
The patient is continue to follow-up for his left shoulder.  He is 84 years old and about 7 weeks ago I provided a steroid injection subacromial space of his left shoulder.  His x-rays showed a well located shoulder with no significant arthritic changes.  He tolerated injection well at first but now is having shoulder pain again special rolls over at night and does overhead activities and reaching behind him as well.  On exam he is got excellent range of motion of his left shoulder.  He does have positive Neer and Hawkins signs and his rotator cuff for the most part feels intact.  He does have pain significantly reaching across in front of him and past 90 degrees of abduction.  I do feel this is shoulder impingement syndrome he would benefit from guided outpatient physical therapy and then a repeat subacromial injection steroid in 4 weeks from now.  He agrees with the plan as well.  All question concerns were answered and addressed.  I did give him a prescription for outpatient physical therapy need to get with him to consider one around the [place] part of the county and city where he [place].

## 2017-12-25 DIAGNOSIS — G4733 Obstructive sleep apnea (adult) (pediatric): Secondary | ICD-10-CM | POA: Diagnosis not present

## 2018-01-01 DIAGNOSIS — I479 Paroxysmal tachycardia, unspecified: Secondary | ICD-10-CM | POA: Diagnosis not present

## 2018-01-01 DIAGNOSIS — E785 Hyperlipidemia, unspecified: Secondary | ICD-10-CM | POA: Diagnosis not present

## 2018-01-01 DIAGNOSIS — I70213 Atherosclerosis of native arteries of extremities with intermittent claudication, bilateral legs: Secondary | ICD-10-CM | POA: Diagnosis not present

## 2018-01-01 DIAGNOSIS — I25719 Atherosclerosis of autologous vein coronary artery bypass graft(s) with unspecified angina pectoris: Secondary | ICD-10-CM | POA: Diagnosis not present

## 2018-01-01 DIAGNOSIS — Z951 Presence of aortocoronary bypass graft: Secondary | ICD-10-CM | POA: Diagnosis not present

## 2018-01-01 DIAGNOSIS — I2510 Atherosclerotic heart disease of native coronary artery without angina pectoris: Secondary | ICD-10-CM | POA: Diagnosis not present

## 2018-01-01 DIAGNOSIS — I6523 Occlusion and stenosis of bilateral carotid arteries: Secondary | ICD-10-CM | POA: Diagnosis not present

## 2018-01-05 DIAGNOSIS — M7542 Impingement syndrome of left shoulder: Secondary | ICD-10-CM | POA: Diagnosis not present

## 2018-01-07 DIAGNOSIS — M7542 Impingement syndrome of left shoulder: Secondary | ICD-10-CM | POA: Diagnosis not present

## 2018-01-11 DIAGNOSIS — M7542 Impingement syndrome of left shoulder: Secondary | ICD-10-CM | POA: Diagnosis not present

## 2018-01-12 DIAGNOSIS — H40013 Open angle with borderline findings, low risk, bilateral: Secondary | ICD-10-CM | POA: Diagnosis not present

## 2018-01-14 DIAGNOSIS — M7542 Impingement syndrome of left shoulder: Secondary | ICD-10-CM | POA: Diagnosis not present

## 2018-01-18 DIAGNOSIS — M7542 Impingement syndrome of left shoulder: Secondary | ICD-10-CM | POA: Diagnosis not present

## 2018-01-19 ENCOUNTER — Ambulatory Visit (INDEPENDENT_AMBULATORY_CARE_PROVIDER_SITE_OTHER): Payer: Medicare HMO | Admitting: Orthopaedic Surgery

## 2018-01-21 DIAGNOSIS — M7542 Impingement syndrome of left shoulder: Secondary | ICD-10-CM | POA: Diagnosis not present

## 2018-01-25 DIAGNOSIS — M7542 Impingement syndrome of left shoulder: Secondary | ICD-10-CM | POA: Diagnosis not present

## 2018-01-25 DIAGNOSIS — G4733 Obstructive sleep apnea (adult) (pediatric): Secondary | ICD-10-CM | POA: Diagnosis not present

## 2018-01-28 DIAGNOSIS — M7542 Impingement syndrome of left shoulder: Secondary | ICD-10-CM | POA: Diagnosis not present

## 2018-02-02 ENCOUNTER — Ambulatory Visit (INDEPENDENT_AMBULATORY_CARE_PROVIDER_SITE_OTHER): Payer: Medicare HMO | Admitting: Orthopaedic Surgery

## 2018-02-04 DIAGNOSIS — R270 Ataxia, unspecified: Secondary | ICD-10-CM | POA: Insufficient documentation

## 2018-02-04 DIAGNOSIS — Z9861 Coronary angioplasty status: Secondary | ICD-10-CM | POA: Diagnosis not present

## 2018-02-04 DIAGNOSIS — R42 Dizziness and giddiness: Secondary | ICD-10-CM | POA: Diagnosis not present

## 2018-02-04 DIAGNOSIS — Z8673 Personal history of transient ischemic attack (TIA), and cerebral infarction without residual deficits: Secondary | ICD-10-CM | POA: Diagnosis not present

## 2018-02-04 DIAGNOSIS — Z6822 Body mass index (BMI) 22.0-22.9, adult: Secondary | ICD-10-CM | POA: Diagnosis not present

## 2018-02-08 ENCOUNTER — Encounter: Payer: Self-pay | Admitting: Cardiology

## 2018-02-08 DIAGNOSIS — R82998 Other abnormal findings in urine: Secondary | ICD-10-CM | POA: Diagnosis not present

## 2018-02-08 DIAGNOSIS — R7301 Impaired fasting glucose: Secondary | ICD-10-CM | POA: Diagnosis not present

## 2018-02-08 DIAGNOSIS — M109 Gout, unspecified: Secondary | ICD-10-CM | POA: Diagnosis not present

## 2018-02-08 DIAGNOSIS — I10 Essential (primary) hypertension: Secondary | ICD-10-CM | POA: Diagnosis not present

## 2018-02-08 DIAGNOSIS — E7849 Other hyperlipidemia: Secondary | ICD-10-CM | POA: Diagnosis not present

## 2018-02-08 DIAGNOSIS — E038 Other specified hypothyroidism: Secondary | ICD-10-CM | POA: Diagnosis not present

## 2018-02-09 DIAGNOSIS — D225 Melanocytic nevi of trunk: Secondary | ICD-10-CM | POA: Diagnosis not present

## 2018-02-09 DIAGNOSIS — D2271 Melanocytic nevi of right lower limb, including hip: Secondary | ICD-10-CM | POA: Diagnosis not present

## 2018-02-09 DIAGNOSIS — L812 Freckles: Secondary | ICD-10-CM | POA: Diagnosis not present

## 2018-02-09 DIAGNOSIS — L821 Other seborrheic keratosis: Secondary | ICD-10-CM | POA: Diagnosis not present

## 2018-02-10 ENCOUNTER — Encounter (INDEPENDENT_AMBULATORY_CARE_PROVIDER_SITE_OTHER): Payer: Self-pay | Admitting: Orthopaedic Surgery

## 2018-02-10 ENCOUNTER — Ambulatory Visit (INDEPENDENT_AMBULATORY_CARE_PROVIDER_SITE_OTHER): Payer: Medicare HMO

## 2018-02-10 ENCOUNTER — Ambulatory Visit (INDEPENDENT_AMBULATORY_CARE_PROVIDER_SITE_OTHER): Payer: Medicare HMO | Admitting: Orthopaedic Surgery

## 2018-02-10 DIAGNOSIS — M542 Cervicalgia: Secondary | ICD-10-CM | POA: Diagnosis not present

## 2018-02-10 MED ORDER — TIZANIDINE HCL 4 MG PO TABS: 4.0000 mg | ORAL_TABLET | Freq: Every evening | ORAL | 0 refills | Status: DC | PRN

## 2018-02-10 NOTE — Progress Notes (Signed)
Office Visit Note   Patient: Ian Cummings           Date of Birth: [birth year]           MRN: [id number] Visit Date: 02/10/2018              Requested by: Marton Redwood, MD [address] [place], [place] [address] PCP: Marton Redwood, MD   Assessment & Plan: Visit Diagnoses:  1. Neck pain     Plan: Patient is on chronic Plavix and is diabetic diet controlled therefore would not place him on any NSAIDs or Medrol Dosepak.  Replacement some Zanaflex to take at night.  Recommend moist heat to the neck.  See physical therapy for range of motion, home exercise program modalities cervical spine.  Follow-up with [place] in 2 weeks check his progress lack of.  Questions were encouraged and answered.  Follow-Up Instructions: Return in about 2 weeks (around 02/24/2018).   Orders:  Orders Placed This Encounter  Procedures  . XR Cervical Spine 2 or 3 views   Meds ordered this encounter  Medications  . tiZANidine (ZANAFLEX) 4 MG tablet    Sig: Take 1 tablet (4 mg total) by mouth at bedtime as needed for muscle spasms.    Dispense:  30 tablet    Refill:  0      Procedures: No procedures performed   Clinical Data: No additional findings.   Subjective: Chief Complaint  Patient presents with  . Left Shoulder - Pain  . Neck - Pain    HPI Ian Cummings returns today in follow-up of his left shoulder.  He states his shoulder really is not improved.  He mainly today is complaining of neck pain.  He states that he had some dizziness and staggering started 8 days ago.  He was seen by his primary care physician Dr. Brigitte Pulse and he reportedly ordered a CT scan of Ian Cummings head.  Patient reports the CT scan was normal, results unavailable.  States that all his lab was normal also.  States 2 days ago he developed neck pain severe radiates down into his shoulder and at times he has numbness into his hand on the left side.  He has had no known injury did do a lot of yard work and is unsure if this brought  all of this.  Denies any change in vision headache.  Review of Systems  Constitutional: Negative for chills and fever.  Respiratory: Negative for shortness of breath.   Musculoskeletal: Positive for neck stiffness.  Neurological: Positive for dizziness.     Objective: Vital Signs: There were no vitals taken for this visit.  Physical Exam  Constitutional: He is oriented to person, place, and time. He appears well-developed and well-nourished. No distress.  HENT:  Head: Normocephalic.  No facial droop is able to smile.  Neurological: He is alert and oriented to person, place, and time.  Skin: He is not diaphoretic.    Ortho Exam Cervical spine limited range of motion.  He has tenderness at the distal portion of the cervical spine and upper thoracic region.  Tenderness in the left trapezius region.  No tenderness medial border of scapula bilaterally.  Upper extremity strength testing is 5 out of 5 throughout.  He does have positive impingement on the left.  Radial pulses are equal and symmetric.  Sensation intact throughout bilateral hands.  He has full motor of both hands. Specialty Comments:  No specialty comments available.  Imaging: Xr Cervical Spine  2 Or 3 Views  Result Date: 02/10/2018 AP and lateral views of cervical spine shows degenerative changes.  Disc space loss at C7-T1.  No spondylolisthesis.  Carotic stent present on the right.  No acute fractures.    PMFS History: Patient Active Problem List   Diagnosis Date Noted  . Impingement syndrome of left shoulder 12/17/2017  . Complex sleep apnea syndrome 11/11/2017  . Bradycardia on ECG 11/11/2017  . Malnutrition of moderate degree 08/05/2015  . PAD (peripheral artery disease) ([place]) 08/03/2015  . Atherosclerotic PVD with intermittent claudication ([place]) 02/20/2014  . Aftercare following surgery of the circulatory system, [place] 12/21/2013  . Hypothyroidism   . CAD (coronary artery disease) 09/06/2012  . s/p CABG   .  Hyperlipidemia   . Essential hypertension   . Gout   . BPH (benign prostatic hypertrophy)   . 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 Carotid artery disease without cerebral infarction [facility])    Past Medical History:  Diagnosis Date  . AAA (abdominal aortic aneurysm) ([place])    2004, Pt stated not there now  . Anginal pain ([place])   . Anxiety   . Arthritis   . BPH (benign prostatic hypertrophy)   . CAD (coronary artery disease)   . Carotid artery disease ([place])    Right carotid stent 2013  . Colon polyps    adenomatous  . Complex sleep apnea syndrome 11/11/2017  . Depression   . GERD (gastroesophageal reflux disease)   . Gout   . H/O hiatal hernia   . Hyperlipidemia   . Hypertension   . Hypothyroidism   . MI (myocardial infarction) ([place]) 4/14  . Neuropathy    Hx: of in B/L toes  . Peripheral vascular disease ([place])    with claudication  . Pre-diabetes   . Stroke ([place])   . TIA (transient ischemic attack)    Hx: of    Family History  Problem Relation Age of Onset  . Heart disease Mother        After 63 yrs of age  . Heart attack Mother   . Hyperlipidemia Father   . Hypertension Father   . Heart disease Father        After 26 yrs of age  . Diabetes Father   . Stroke Sister   . Colon cancer Neg Hx   . Stomach cancer Neg Hx   . Rectal cancer Neg Hx   . Esophageal cancer Neg Hx   . Liver disease Neg Hx   . Kidney disease Neg Hx     Past Surgical History:  Procedure Laterality Date  . CAROTID STENT INSERTION Right 2013   right [facility]  . CATARACT EXTRACTION Bilateral    Hx: of both eyes  . COLONOSCOPY     Hx: of  . COLONOSCOPY    . CORONARY ANGIOPLASTY WITH STENT PLACEMENT  2010  . CORONARY ARTERY BYPASS GRAFT  1995  . CORONARY ARTERY BYPASS GRAFT N/A 10/18/2012   Procedure: REDO CORONARY ARTERY BYPASS GRAFTING (CABG);  Surgeon: Grace Isaac, MD;  Location: [place];  Service: Open Heart  Surgery;  Laterality: N/A;  off pump times two using endoscopically harvested left saphenous vein  . CORONARY STENT PLACEMENT  06/2016  . ENDARTERECTOMY FEMORAL Right 08/03/2015   Procedure: RIGHT FEMORAL ENDARTERECTOMY WITH PATCH ANGIOPLASTY;  Surgeon: Serafina Mitchell, MD;  Location: [place];  Service: Vascular;  Laterality: Right;  . FEMORAL-POPLITEAL BYPASS GRAFT Right  08/03/2015   Procedure: RIGHT FEMORAL-BELOW KNEE POPLITEAL ARTERY BYPASS GRAFT;  Surgeon: Serafina Mitchell, MD;  Location: [place];  Service: Vascular;  Laterality: Right;  . FOOT SURGERY Left   . FRACTURE SURGERY Left    Hx: of Left heel  . INGUINAL HERNIA REPAIR Bilateral    X 2   . INTRAOPERATIVE TRANSESOPHAGEAL ECHOCARDIOGRAM N/A 10/18/2012   Procedure: INTRAOPERATIVE TRANSESOPHAGEAL ECHOCARDIOGRAM;  Surgeon: Grace Isaac, MD;  Location: [facility];  Service: Open Heart Surgery;  Laterality: N/A;  . LEFT HEART CATHETERIZATION WITH CORONARY/GRAFT ANGIOGRAM N/A 02/02/2013   Procedure: LEFT HEART CATHETERIZATION WITH Beatrix Fetters;  Surgeon: Jacolyn Reedy, MD;  Location[facility];  Service: Cardiovascular;  Laterality: N/A;  . LOWER EXTREMITY ANGIOGRAM Right 06/27/2015   Procedure: Lower Extremity Angiogram;  Surgeon: Serafina Mitchell, MD;  Location: [place] CV LAB;  Service: Cardiovascular;  Laterality: Right;  . MASTOIDECTOMY  1933  . PERIPHERAL VASCULAR CATHETERIZATION N/A 01/23/2015   Procedure: Abdominal Aortogram;  Surgeon: Serafina Mitchell, MD;  Location: [place] CV LAB;  Service: Cardiovascular;  Laterality: N/A;  . PERIPHERAL VASCULAR CATHETERIZATION N/A 06/27/2015   Procedure: Abdominal Aortogram;  Surgeon: Serafina Mitchell, MD;  Location: [place] CV LAB;  Service: Cardiovascular;  Laterality: N/A;  . RETINAL DETACHMENT SURGERY Left    Hx: of left eye  . TONSILLECTOMY     Social History   Occupational History  . Occupation: retired  Tobacco Use  . Smoking status: Former Smoker    Types:  Cigarettes    Last attempt to quit: 04/29/1975    Years since quitting: 42.8  . Smokeless tobacco: Never Used  Substance and Sexual Activity  . Alcohol use: Yes    Comment: Occasional drink  . Drug use: No  . Sexual activity: Not Currently

## 2018-02-11 ENCOUNTER — Telehealth (INDEPENDENT_AMBULATORY_CARE_PROVIDER_SITE_OTHER): Payer: Self-pay | Admitting: Orthopaedic Surgery

## 2018-02-11 MED ORDER — HYDROCODONE-ACETAMINOPHEN 5-325 MG PO TABS: 1.0000 | ORAL_TABLET | Freq: Four times a day (QID) | ORAL | 0 refills | Status: DC | PRN

## 2018-02-11 NOTE — Telephone Encounter (Signed)
Patient's wife Wells Guiles called advised patient is much worse and want to know what to do. She advised nothing is working to help with the pain. Wells Guiles asked for a call back as soon as possible. The number to contact Wells Guiles is [phone]

## 2018-02-11 NOTE — Telephone Encounter (Signed)
Artis Delay saw him yesterday and sent in a muscle relaxer.  I'll send in some norco.  Really nothing else we can do.

## 2018-02-11 NOTE — Telephone Encounter (Signed)
See below

## 2018-02-11 NOTE — Telephone Encounter (Signed)
Patient wife aware of the below message  °

## 2018-02-14 ENCOUNTER — Encounter: Payer: Self-pay | Admitting: Neurology

## 2018-02-15 DIAGNOSIS — M5412 Radiculopathy, cervical region: Secondary | ICD-10-CM | POA: Diagnosis not present

## 2018-02-15 DIAGNOSIS — R7301 Impaired fasting glucose: Secondary | ICD-10-CM | POA: Diagnosis not present

## 2018-02-15 DIAGNOSIS — M109 Gout, unspecified: Secondary | ICD-10-CM | POA: Diagnosis not present

## 2018-02-15 DIAGNOSIS — I6529 Occlusion and stenosis of unspecified carotid artery: Secondary | ICD-10-CM | POA: Diagnosis not present

## 2018-02-15 DIAGNOSIS — Z0000 Encounter for general adult medical examination without abnormal findings: Secondary | ICD-10-CM | POA: Diagnosis not present

## 2018-02-15 DIAGNOSIS — I10 Essential (primary) hypertension: Secondary | ICD-10-CM | POA: Diagnosis not present

## 2018-02-15 DIAGNOSIS — Z9861 Coronary angioplasty status: Secondary | ICD-10-CM | POA: Diagnosis not present

## 2018-02-15 DIAGNOSIS — Z23 Encounter for immunization: Secondary | ICD-10-CM | POA: Diagnosis not present

## 2018-02-15 DIAGNOSIS — E7849 Other hyperlipidemia: Secondary | ICD-10-CM | POA: Diagnosis not present

## 2018-02-15 DIAGNOSIS — I7389 Other specified peripheral vascular diseases: Secondary | ICD-10-CM | POA: Diagnosis not present

## 2018-02-17 ENCOUNTER — Ambulatory Visit: Payer: Medicare HMO | Admitting: Neurology

## 2018-02-17 ENCOUNTER — Encounter: Payer: Self-pay | Admitting: Neurology

## 2018-02-17 ENCOUNTER — Other Ambulatory Visit: Payer: Self-pay | Admitting: Internal Medicine

## 2018-02-17 VITALS — BP 120/63 | HR 57 | Ht 70.0 in | Wt 165.5 lb

## 2018-02-17 DIAGNOSIS — Z9989 Dependence on other enabling machines and devices: Secondary | ICD-10-CM | POA: Diagnosis not present

## 2018-02-17 DIAGNOSIS — M5412 Radiculopathy, cervical region: Secondary | ICD-10-CM

## 2018-02-17 DIAGNOSIS — G4733 Obstructive sleep apnea (adult) (pediatric): Secondary | ICD-10-CM | POA: Insufficient documentation

## 2018-02-17 HISTORY — DX: Obstructive sleep apnea (adult) (pediatric): G47.33

## 2018-02-17 NOTE — Progress Notes (Signed)
SLEEP MEDICINE CLINIC   Provider:  Larey Cummings, M D  Primary Care Physician:  Ian Redwood, MD   Referring Provider: Marton Redwood, MD    Chief Complaint  Patient presents with  . Follow-up    Rm 10, alone  . initial Cpap    doing ok with cpap,  Wants to know how the studies determined apnea-   FYI having issues with neck pain that radiated into left chest and left shoulder and is seeing Ian Cummings, and Ian Cummings. Felt as if he could fall any time-MRI brain negative.    HPI:  Ian Cummings is a 84 y.o. male patient , seen on 02-17-2018 in follow up after 2 sleep studies.  The patient underwent a PSG on 09-28-2017 and was diagnosed with apnea.  The patient's polysomnography showed a very poor sleep efficiency at 58%, but surprisingly high percentage of REM sleep at 23.4%, AHI was 13.8 during REM sleep 24.2 and in supine sleep only 7.1/h which surprised me.  He did not have prolonged oxygen desaturations, total desat time was 60 minutes.  No PML's.  He had moderate loud snoring, I advised to follow-up with a full night CPAP titration for which he returned on 10-20-2017.  CPAP was initiated at 5 and step-by-step increased to 13 cmH2O with a reduction of the AHI to 1.9/h.  Sleep efficiency was much better 75%, sleep apnea was reduced there was clearly a supine AHI dominance now stated that was not seen in his PSG.  He was fitted with an E son nasal mask in large size by FNP, diagnosis was that of complex sleep apnea responding to CPAP at pressures between 10 and 13 cmH2O.  No significant hypoxemia.  Chinstrap was recommended, EPR function of 2 cmH2O.  The patient has used his CPAP compliantly 100% the last 30 days and with 7 hours and 30 minutes on average, he uses the AutoSet between 7 and 15 cmH2O with no ramp time and a 3 cm expiratory pressure relief, he does have some central apneas but his AHI at 4.4 is much improved over baseline.  He also does not have the same significant air leaks he  had in the study since he is using his chinstrap.  He likes his nasal mask has gotten used to it and he felt that the machine gave him energy the use of CPAP actually energized him enough to garden.  Unfortunately about 14 days ago, he overdid it and since then has felt so sore achy and if he has pinched nerve in his neck.  He also has felt a little dizzy, off balance and is in the midst of a work-up with Ian Cummings, orthopedist,  for the neck and shoulder joints.   Patient was seen on 09-17-2017 upon  referral from Ian Cummings for a sleep consultation, for excessive daytime sleepiness. He is likely a descendent of the [facility] family , from [place]-  Ian Cummings is a 84 year old right-handed Caucasian gentleman who reports "having lost energy"  over the last 8 months.  He has been for the whole time on blood pressure correcting medication, beta-blockers were chosen, 3 of them were tried.  He thinks that some of his fatigue developed after he had been for years already on  Atenolol, which finally gave him bradycardia. He was changed to other medications- now he is tired after waking up and having breakfast , dozes off while he is reading the morning paper. Chief complaint according to  patient :"I have always had loads of energy but since October 2018 I lost it- I am fatigued" ; "I have a slow heart beat ",  He was on atenolol and became bradycardic in the 40's , changed to Bystolic and Metoprolol.  He now had low systolic pressures under 423 mm Hg, get's dizzy and lightheaded. After breakfast I need my easy chair and sleep again- I was never this way. He reports 4 nocturias nightly.   Sleep habits are as follows: The patient usually watches TV before he goes to bed, he will be in bed by about 10 PM to 11 PM.  He promptly can go to sleep but he may not stay asleep for long as he has developed frequent nocturia, up to 4 times at night.  He rises between 630 and 7 AM.  He reports dreaming frequently,  vividly and about his work.  He is longtime retired.  He describes his bedroom is cool, quiet and dark, he sleeps with one pillow on a flat mattress. He sleeps on his side.  Family members and especially his wife has told him that he snores, and for this reason his wife sleeps in a different bedroom.  He estimates on average 6 hours of nocturnal sleep may be even more.  But as he told his before he will feel right after breakfast when at rest sleepy enough to doze off.  He routinely also takes LDAP after lunch which lasts about 60 minutes or more.  He does not believe he dreams during his nap time.  He does not fall asleep in front of the TV before going to bed.   Sleep medical history and family sleep history: history of medical condition- see list below : The patient had surgery for right carotid stenosis, a stent was placed in February 2013 and he has followed [facility] vascular surgeons.  He had coronary artery disease status post open heart surgery with CABG and then in 2014 had additional cardiac stents placed as to grafts had occluded.  He had a redo of CABG in June 2014 and he had developed angina again in March 2018.  History of hypertension, hyperlipidemia IVIG, gout, acid reflux, he has an abdominal aortic aneurysm screen which was negative. Has Peripheral neuropathy , claudication and peripheral vascular disease is followed by Dr. Trula Slade he had an angioplasty with ilio-bypass in August 2017.  Father loved to be 38 , CAD.PAD, mother died at 66 sudden onset- suspected of stroke/ no autopsy. Son has sleep apnea on CPAP.    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  [employer] . Non smoker ( smoked 20 years [phone]), drinks a glass of wine 3 nights a week. Caffeine - coffee 50/50 ( caffeinated and decaffeinated)  6 cups.  No soda , no iced tea. 2 sons, one schizoaffective disorder, one OSA, healthy. Dependent son is 55.   Review of Systems: Out of a complete 14  system review, the patient complains of only the following symptoms, and all other reviewed systems are negative.  Snoring very loudly, apneas. Nasal speech, sinus congestion. Post nasal drip.   Epworth score  7 , Fatigue severity score 50  , depression score 1/ 15 - " I am not depressed, nor anxious"    Social History   Socioeconomic History  . Marital status: Married    Spouse name: Not on file  . Number of children: 3  . Years of education: Not on file  .  Highest education level: Not on file  Occupational History  . 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 Financial resource strain: Not on file  . Food insecurity:    Worry: Not on file    Inability: Not on file  . Transportation needs:    Medical: Not on file    Non-medical: Not on file  Tobacco Use  . Smoking status: Former Smoker    Types: Cigarettes    Last attempt to quit: 04/29/1975    Years since quitting: 42.8  . Smokeless tobacco: Never Used  Substance and Sexual Activity  . Alcohol use: Yes    Comment: Occasional drink  . Drug use: No  . Sexual activity: Not Currently  Lifestyle  . Physical activity:    Days per week: Not on file    Minutes per session: Not on file  . Stress: Not on file  Relationships  . Social connections:    Talks on phone: Not on file    Gets together: Not on file    Attends religious service: Not on file    Active member of club or organization: Not on file    Attends meetings of clubs or organizations: Not on file    Relationship status: Not on file  . Intimate partner violence:    Fear of current or ex partner: Not on file    Emotionally abused: Not on file    Physically abused: Not on file    Forced sexual activity: Not on file  Other Topics Concern  . 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 Heart disease Mother        After 58 yrs of age  . Heart attack Mother   . Hyperlipidemia Father   .  Hypertension Father   . Heart disease Father        After 16 yrs of age  . Diabetes Father   . Stroke Sister   . Colon cancer Neg Hx   . Stomach cancer Neg Hx   . Rectal cancer Neg Hx   . Esophageal cancer Neg Hx   . Liver disease Neg Hx   . Kidney disease Neg Hx     Past Medical History:  Diagnosis Date  . AAA (abdominal aortic aneurysm) ([place])    2004, Pt stated not there now  . Anginal pain ([place])   . Anxiety   . Arthritis   . BPH (benign prostatic hypertrophy)   . CAD (coronary artery disease)   . Carotid artery disease ([place])    Right carotid stent 2013  . Colon polyps    adenomatous  . Complex sleep apnea syndrome 11/11/2017  . Depression   . GERD (gastroesophageal reflux disease)   . Gout   . H/O hiatal hernia   . Hyperlipidemia   . Hypertension   . Hypothyroidism   . MI (myocardial infarction) ([place]) 4/14  . Neuropathy    Hx: of in B/L toes  . Peripheral vascular disease ([place])    with claudication  . Pre-diabetes   . Stroke ([place])   . TIA (transient ischemic attack)    Hx: of    Past Surgical History:  Procedure Laterality Date  . CAROTID STENT INSERTION Right 2013   right at [facility]  . CATARACT EXTRACTION Bilateral    Hx: of both eyes  . COLONOSCOPY     Hx: of  .  COLONOSCOPY    . CORONARY ANGIOPLASTY WITH STENT PLACEMENT  2010  . CORONARY ARTERY BYPASS GRAFT  1995  . CORONARY ARTERY BYPASS GRAFT N/A 10/18/2012   Procedure: REDO CORONARY ARTERY BYPASS GRAFTING (CABG);  Surgeon: Grace Isaac, MD;  Location: [place];  Service: Open Heart Surgery;  Laterality: N/A;  off pump times two using endoscopically harvested left saphenous vein  . CORONARY STENT PLACEMENT  06/2016  . ENDARTERECTOMY FEMORAL Right 08/03/2015   Procedure: RIGHT FEMORAL ENDARTERECTOMY WITH PATCH ANGIOPLASTY;  Surgeon: Serafina [place], MD;  Location: [place];  Service: Vascular;  Laterality: Right;  . FEMORAL-POPLITEAL BYPASS GRAFT Right 08/03/2015   Procedure: RIGHT FEMORAL-BELOW  KNEE POPLITEAL ARTERY BYPASS GRAFT;  Surgeon: Serafina [place], MD;  Location: [place];  Service: Vascular;  Laterality: Right;  . FOOT SURGERY Left   . FRACTURE SURGERY Left    Hx: of Left heel  . INGUINAL HERNIA REPAIR Bilateral    X 2   . INTRAOPERATIVE TRANSESOPHAGEAL ECHOCARDIOGRAM N/A 10/18/2012   Procedure: INTRAOPERATIVE TRANSESOPHAGEAL ECHOCARDIOGRAM;  Surgeon: Grace Isaac, MD;  Location: [place];  Service: Open Heart Surgery;  Laterality: N/A;  . LEFT HEART CATHETERIZATION WITH CORONARY/GRAFT ANGIOGRAM N/A 02/02/2013   Procedure: LEFT HEART CATHETERIZATION WITH Beatrix Fetters;  Surgeon: Jacolyn Reedy, MD;  Location: [facility] CATH LAB;  Service: Cardiovascular;  Laterality: N/A;  . LOWER EXTREMITY ANGIOGRAM Right 06/27/2015   Procedure: Lower Extremity Angiogram;  Surgeon: Serafina [place], MD;  Location: [place] CV LAB;  Service: Cardiovascular;  Laterality: Right;  . MASTOIDECTOMY  1933  . PERIPHERAL VASCULAR CATHETERIZATION N/A 01/23/2015   Procedure: Abdominal Aortogram;  Surgeon: Serafina [place], MD;  Location: [place] CV LAB;  Service: Cardiovascular;  Laterality: N/A;  . PERIPHERAL VASCULAR CATHETERIZATION N/A 06/27/2015   Procedure: Abdominal Aortogram;  Surgeon: Serafina [place], MD;  Location: [facility] CV LAB;  Service: Cardiovascular;  Laterality: N/A;  . RETINAL DETACHMENT SURGERY Left    Hx: of left eye  . TONSILLECTOMY      Current Outpatient Medications  Medication Sig Dispense Refill  . allopurinol (ZYLOPRIM) 100 MG tablet Take 100 mg by mouth daily.     Marland Kitchen amLODipine (NORVASC) 5 MG tablet Take by mouth.     Marland Kitchen aspirin EC 81 MG tablet Take 81 mg by mouth 2 (two) times daily.     . B Complex Vitamins (B COMPLEX 1 PO) Take 1 tablet by mouth daily.    . Cholecalciferol (VITAMIN D) 2000 units CAPS Take 1,000 Units by mouth daily.     . clopidogrel (PLAVIX) 75 MG tablet Take 75 mg by mouth daily.    Marland Kitchen HYDROcodone-acetaminophen (NORCO/VICODIN) 5-325 MG tablet  Take 1-2 tablets by mouth every 6 (six) hours as needed for moderate pain. 30 tablet 0  . isosorbide mononitrate (IMDUR) 30 MG 24 hr tablet     . levothyroxine (SYNTHROID, LEVOTHROID) 88 MCG tablet Take 88 mcg by mouth daily before breakfast.    . lisinopril (PRINIVIL,ZESTRIL) 20 MG tablet Take 20 mg by mouth daily.    . multivitamin (RENA-VIT) TABS tablet Take 1 tablet by mouth daily.    . nebivolol (BYSTOLIC) 10 MG tablet Take 10 mg by mouth daily.    Marland Kitchen omeprazole (PRILOSEC) 20 MG capsule Take 20 mg by mouth daily.    Marland Kitchen PARoxetine (PAXIL) 20 MG tablet Take 20 mg by mouth every morning.    Marland Kitchen RANEXA 500 MG 12 hr tablet Take 500 mg by  mouth 2 (two) times daily.     . rosuvastatin (CRESTOR) 40 MG tablet Take 40 mg by mouth daily.    Marland Kitchen tiZANidine (ZANAFLEX) 4 MG tablet Take 1 tablet (4 mg total) by mouth at bedtime as needed for muscle spasms. 30 tablet 0   No current facility-administered medications for this visit.     Allergies as of 02/17/2018 - Review Complete 02/17/2018  Allergen Reaction Noted  . Amoxicillin Rash 02/11/2016  . Metoprolol Itching 12/20/2012    Vitals: BP 120/63   Cummings (!) 57   Ht 5\' 10"  (1.778 m)   Wt 165 lb 8 oz (75.1 kg)   BMI 23.75 kg/m  Last Weight:  Wt Readings from Last 1 Encounters:  02/17/18 165 lb 8 oz (75.1 kg)   NLZ:JQBH mass index is 23.75 kg/m.     Last Height:   Ht Readings from Last 1 Encounters:  02/17/18 5\' 10"  (1.778 m)    Physical exam:  General: The patient is awake, alert and appears not in acute distress. Not shaven.  Head: Normocephalic, atraumatic. Neck is supple. Mallampati 3,  neck circumference:16. Nasal airflow congested, Retrognathia is seen.  Cardiovascular:  Regular rate and rhythm , without  murmurs or carotid bruit, and without distended neck veins. Respiratory: Lungs are clear to auscultation. Skin:  Without evidence of edema, or rash Trunk:    Neurologic exam : The patient is awake and alert, oriented to place and  time.   Memory subjective described as intact.  Attention span & concentration ability appears normal.  Speech is fluent,  with  dysphonia.  Mood and affect are appropriate.  Cranial nerves: Pupils are equal and briskly reactive to light. Funduscopic exam without evidence of pallor or edema. Extraocular movements  in vertical and horizontal planes intact and without nystagmus. Visual fields by finger perimetry are intact. Hearing to finger rub intact.  Facial sensation intact to fine touch.  Facial motor strength is symmetric and tongue and uvula move midline. Shoulder shrug was symmetrical.   Motor exam:   Normal tone, muscle bulk and symmetric strength in all extremities. He has a weak grip but strong inch, reports that arthritic pain hinders him to flex the middle 3 fingers.  Sensory:  Fine touch, pinprick and vibration were tested in all extremities. Proprioception tested in the upper extremities was normal. Coordination: Rapid alternating movements/ Finger-to-nose maneuver normal without evidence of ataxia, dysmetria or tremor. Gait and station: Patient walks without assistive device and is able unassisted to climb up to the exam table. Strength within normal limits.  Stance is stable and normal.  Deep tendon reflexes: in the  upper and lower extremities are  intact. Crossed adductor reflex and brisk patella on the right.     Assessment:  After physical and neurologic examination, review of laboratory studies,  Personal review of imaging studies, reports of other /same  Imaging studies, results of polysomnography and / or neurophysiology testing and pre-existing records as far as provided in visit., my assessment is    Mr. Dobek reports that he has no difficulties initiating sleep, but that his sleep had been fragmented by nocturia.     1)Nocturia much improved on CPAP,  1 time nightly now !   2)EDS / fatigue - improved with treatment of OSA by CPAP, feels much energized. continues with  a  30-45 minute nap after lunch.     3) CAD, PAD; Given a quite significant cardiac history and peripheral vascular disease history it is  preferable for the patient to have an attended sleep study with a split-night protocol.  I also wonder if the change between 2 beta-blockers may have promoted some of the daytime fatigue since the patient struggles with bradycardia but achieved a normalization of blood pressure with his new regimen.  I spent more than 25 minutes of face to face time with the patient.  Greater than 50% of time was spent in counseling and coordination of care. We have discussed the diagnosis and differential and I answered the patient's questions.    RV with Np or me in one year !   Ian Seat, MD 84/16/6063, 0:16 PM  Certified in Neurology by ABPN Certified in [place] by [facility] [address], [place], [place] [address]

## 2018-02-24 ENCOUNTER — Ambulatory Visit (INDEPENDENT_AMBULATORY_CARE_PROVIDER_SITE_OTHER): Payer: Medicare HMO | Admitting: Orthopaedic Surgery

## 2018-02-24 DIAGNOSIS — G4733 Obstructive sleep apnea (adult) (pediatric): Secondary | ICD-10-CM | POA: Diagnosis not present

## 2018-02-27 ENCOUNTER — Ambulatory Visit
Admission: RE | Admit: 2018-02-27 | Discharge: 2018-02-27 | Disposition: A | Discharge status: Home / Self Care | Payer: Medicare HMO | Source: Ambulatory Visit | Attending: Internal Medicine | Admitting: Internal Medicine

## 2018-02-27 DIAGNOSIS — M4802 Spinal stenosis, cervical region: Secondary | ICD-10-CM | POA: Diagnosis not present

## 2018-02-27 DIAGNOSIS — M5412 Radiculopathy, cervical region: Secondary | ICD-10-CM

## 2018-03-04 ENCOUNTER — Other Ambulatory Visit (INDEPENDENT_AMBULATORY_CARE_PROVIDER_SITE_OTHER): Payer: Self-pay | Admitting: Physician Assistant

## 2018-03-04 NOTE — Telephone Encounter (Signed)
Please advise 

## 2018-03-11 DIAGNOSIS — M5412 Radiculopathy, cervical region: Secondary | ICD-10-CM | POA: Diagnosis not present

## 2018-03-11 DIAGNOSIS — M542 Cervicalgia: Secondary | ICD-10-CM | POA: Diagnosis not present

## 2018-03-19 DIAGNOSIS — M5412 Radiculopathy, cervical region: Secondary | ICD-10-CM | POA: Diagnosis not present

## 2018-03-19 DIAGNOSIS — J029 Acute pharyngitis, unspecified: Secondary | ICD-10-CM | POA: Diagnosis not present

## 2018-03-19 DIAGNOSIS — Z6823 Body mass index (BMI) 23.0-23.9, adult: Secondary | ICD-10-CM | POA: Diagnosis not present

## 2018-03-19 DIAGNOSIS — R05 Cough: Secondary | ICD-10-CM | POA: Diagnosis not present

## 2018-03-22 ENCOUNTER — Telehealth (INDEPENDENT_AMBULATORY_CARE_PROVIDER_SITE_OTHER): Payer: Self-pay | Admitting: *Deleted

## 2018-03-22 NOTE — Telephone Encounter (Signed)
Submitted PA to [employer].   Auth No / Request ID [id number] Status Received

## 2018-03-24 ENCOUNTER — Telehealth (INDEPENDENT_AMBULATORY_CARE_PROVIDER_SITE_OTHER): Payer: Self-pay | Admitting: *Deleted

## 2018-03-24 NOTE — Telephone Encounter (Signed)
Called pt and lvm #1 

## 2018-03-27 DIAGNOSIS — G4733 Obstructive sleep apnea (adult) (pediatric): Secondary | ICD-10-CM | POA: Diagnosis not present

## 2018-04-02 ENCOUNTER — Telehealth (INDEPENDENT_AMBULATORY_CARE_PROVIDER_SITE_OTHER): Payer: Self-pay | Admitting: Orthopaedic Surgery

## 2018-04-02 NOTE — Telephone Encounter (Signed)
Pt called to leave a message saying he has decided he didn't want a shot in his neck. pts # is [phone]

## 2018-04-05 NOTE — Telephone Encounter (Signed)
There is really nothing else to do from our standpoint other than considering physical therapy on her cervical spine.

## 2018-04-05 NOTE — Telephone Encounter (Signed)
Please advise 

## 2018-04-26 DIAGNOSIS — G4733 Obstructive sleep apnea (adult) (pediatric): Secondary | ICD-10-CM | POA: Diagnosis not present

## 2018-05-27 DIAGNOSIS — G4733 Obstructive sleep apnea (adult) (pediatric): Secondary | ICD-10-CM | POA: Diagnosis not present

## 2018-06-04 ENCOUNTER — Other Ambulatory Visit: Payer: Self-pay

## 2018-06-04 DIAGNOSIS — I7025 Atherosclerosis of native arteries of other extremities with ulceration: Secondary | ICD-10-CM

## 2018-06-04 DIAGNOSIS — I6523 Occlusion and stenosis of bilateral carotid arteries: Secondary | ICD-10-CM

## 2018-06-11 ENCOUNTER — Ambulatory Visit (HOSPITAL_COMMUNITY)
Admission: RE | Admit: 2018-06-11 | Discharge: 2018-06-11 | Disposition: A | Discharge status: Home / Self Care | Payer: Medicare HMO | Source: Ambulatory Visit | Attending: Surgery | Admitting: Surgery

## 2018-06-11 ENCOUNTER — Ambulatory Visit (INDEPENDENT_AMBULATORY_CARE_PROVIDER_SITE_OTHER)
Admission: RE | Admit: 2018-06-11 | Discharge: 2018-06-11 | Disposition: A | Discharge status: Home / Self Care | Payer: Medicare HMO | Source: Ambulatory Visit | Attending: Surgery | Admitting: Surgery

## 2018-06-11 ENCOUNTER — Other Ambulatory Visit: Payer: Self-pay

## 2018-06-11 ENCOUNTER — Encounter: Payer: Self-pay | Admitting: Family

## 2018-06-11 ENCOUNTER — Ambulatory Visit: Payer: Medicare HMO | Admitting: Physician Assistant

## 2018-06-11 VITALS — BP 159/70 | HR 56 | Resp 18 | Ht 70.0 in | Wt 168.7 lb

## 2018-06-11 DIAGNOSIS — I6523 Occlusion and stenosis of bilateral carotid arteries: Secondary | ICD-10-CM | POA: Diagnosis not present

## 2018-06-11 DIAGNOSIS — I7025 Atherosclerosis of native arteries of other extremities with ulceration: Secondary | ICD-10-CM

## 2018-06-11 NOTE — Progress Notes (Signed)
History of Present Illness:  Patient is a 85 y.o. year old status post right carotid stent performed at [facility] in 2013by Dr. Harvel Ricks. He is also status post right external iliac, common femoral, and profunda femoral artery endarterectomy with bovine pericardial patch angioplasty, followed by a right common femoral to below knee popliteal artery bypass graft with 6 mm Gore-Texon 08-03-15 by Dr. Brunilda Payor a right heel ulcer and a right great toe ulcer, both of which have healed.   Past medical hx:He has a history of CABG in 1996 x 4-5 vessels, off pump CABG x 26 September 2012 by Dr. Servando Snare. He subsequently had a stent placed at the end of March 2018  He is here today for f/u of the carotids and LE pad.  He denies symptoms of claudication with ambulation.  He denies amaurosis, weakness in his extremities or aphasia.    Past Medical History:  Diagnosis Date  . AAA (abdominal aortic aneurysm) ([place])    2004, Pt stated not there now  . Anginal pain ([place])   . Anxiety   . Arthritis   . BPH (benign prostatic hypertrophy)   . CAD (coronary artery disease)   . Carotid artery disease ([place])    Right carotid stent 2013  . Colon polyps    adenomatous  . Complex sleep apnea syndrome 11/11/2017  . Depression   . GERD (gastroesophageal reflux disease)   . Gout   . H/O hiatal hernia   . Hyperlipidemia   . Hypertension   . Hypothyroidism   . MI (myocardial infarction) ([place]) 4/14  . Neuropathy    Hx: of in B/L toes  . Peripheral vascular disease ([place])    with claudication  . Pre-diabetes   . Stroke ([place])   . TIA (transient ischemic attack)    Hx: of    Past Surgical History:  Procedure Laterality Date  . CAROTID STENT INSERTION Right 2013   right at [facility]  . CATARACT EXTRACTION Bilateral    Hx: of both eyes  . COLONOSCOPY     Hx: of  . COLONOSCOPY    . CORONARY ANGIOPLASTY WITH STENT PLACEMENT  2010  . CORONARY ARTERY BYPASS GRAFT  1995  . CORONARY ARTERY BYPASS  GRAFT N/A 10/18/2012   Procedure: REDO CORONARY ARTERY BYPASS GRAFTING (CABG);  Surgeon: Grace Isaac, MD;  Location: [place];  Service: Open Heart Surgery;  Laterality: N/A;  off pump times two using endoscopically harvested left saphenous vein  . CORONARY STENT PLACEMENT  06/2016  . ENDARTERECTOMY FEMORAL Right 08/03/2015   Procedure: RIGHT FEMORAL ENDARTERECTOMY WITH PATCH ANGIOPLASTY;  Surgeon: Serafina Mitchell, MD;  Location: [place];  Service: Vascular;  Laterality: Right;  . FEMORAL-POPLITEAL BYPASS GRAFT Right 08/03/2015   Procedure: RIGHT FEMORAL-BELOW KNEE POPLITEAL ARTERY BYPASS GRAFT;  Surgeon: Serafina Mitchell, MD;  Location: [place];  Service: Vascular;  Laterality: Right;  . FOOT SURGERY Left   . FRACTURE SURGERY Left    Hx: of Left heel  . INGUINAL HERNIA REPAIR Bilateral    X 2   . INTRAOPERATIVE TRANSESOPHAGEAL ECHOCARDIOGRAM N/A 10/18/2012   Procedure: INTRAOPERATIVE TRANSESOPHAGEAL ECHOCARDIOGRAM;  Surgeon: Grace Isaac, MD;  Location: [place];  Service: Open Heart Surgery;  Laterality: N/A;  . LEFT HEART CATHETERIZATION WITH CORONARY/GRAFT ANGIOGRAM N/A 02/02/2013   Procedure: LEFT HEART CATHETERIZATION WITH Beatrix Fetters;  Surgeon: Jacolyn Reedy, MD;  Location[facility] LAB;  Service: Cardiovascular;  Laterality: N/A;  . LOWER  EXTREMITY ANGIOGRAM Right 06/27/2015   Procedure: Lower Extremity Angiogram;  Surgeon: Serafina Mitchell, MD;  Location: [place] CV LAB;  Service: Cardiovascular;  Laterality: Right;  . MASTOIDECTOMY  1933  . PERIPHERAL VASCULAR CATHETERIZATION N/A 01/23/2015   Procedure: Abdominal Aortogram;  Surgeon: Serafina Mitchell, MD;  Location: [place] CV LAB;  Service: Cardiovascular;  Laterality: N/A;  . PERIPHERAL VASCULAR CATHETERIZATION N/A 06/27/2015   Procedure: Abdominal Aortogram;  Surgeon: Serafina Mitchell, MD;  Location: [place] CV LAB;  Service: Cardiovascular;  Laterality: N/A;  . RETINAL DETACHMENT SURGERY Left    Hx: of left eye  .  TONSILLECTOMY       Social History Social History   Tobacco Use  . Smoking status: Former Smoker    Types: Cigarettes    Last attempt to quit: 04/29/1975    Years since quitting: 43.1  . Smokeless tobacco: Never Used  Substance Use Topics  . Alcohol use: Yes    Comment: Occasional drink  . Drug use: No    Family History Family History  Problem Relation Age of Onset  . Heart disease Mother        After 73 yrs of age  . Heart attack Mother   . Hyperlipidemia Father   . Hypertension Father   . Heart disease Father        After 42 yrs of age  . Diabetes Father   . Stroke Sister   . Colon cancer Neg Hx   . Stomach cancer Neg Hx   . Rectal cancer Neg Hx   . Esophageal cancer Neg Hx   . Liver disease Neg Hx   . Kidney disease Neg Hx     Allergies  Allergies  Allergen Reactions  . Amoxicillin Rash  . Metoprolol Itching     Current Outpatient Medications  Medication Sig Dispense Refill  . allopurinol (ZYLOPRIM) 100 MG tablet Take 100 mg by mouth daily.     Marland Kitchen amLODipine (NORVASC) 5 MG tablet Take by mouth.     Marland Kitchen aspirin EC 81 MG tablet Take 81 mg by mouth 2 (two) times daily.     . B Complex Vitamins (B COMPLEX 1 PO) Take 1 tablet by mouth daily.    . Cholecalciferol (VITAMIN D) 2000 units CAPS Take 1,000 Units by mouth daily.     . clopidogrel (PLAVIX) 75 MG tablet Take 75 mg by mouth daily.    Marland Kitchen HYDROcodone-acetaminophen (NORCO/VICODIN) 5-325 MG tablet Take 1-2 tablets by mouth every 6 (six) hours as needed for moderate pain. 30 tablet 0  . isosorbide mononitrate (IMDUR) 30 MG 24 hr tablet     . levothyroxine (SYNTHROID, LEVOTHROID) 88 MCG tablet Take 88 mcg by mouth daily before breakfast.    . lisinopril (PRINIVIL,ZESTRIL) 20 MG tablet Take 20 mg by mouth daily.    . multivitamin (RENA-VIT) TABS tablet Take 1 tablet by mouth daily.    . nebivolol (BYSTOLIC) 10 MG tablet Take 10 mg by mouth daily.    Marland Kitchen omeprazole (PRILOSEC) 20 MG capsule Take 20 mg by mouth daily.     Marland Kitchen PARoxetine (PAXIL) 20 MG tablet Take 20 mg by mouth every morning.    Marland Kitchen RANEXA 500 MG 12 hr tablet Take 500 mg by mouth 2 (two) times daily.     . rosuvastatin (CRESTOR) 40 MG tablet Take 40 mg by mouth daily.    Marland Kitchen tiZANidine (ZANAFLEX) 4 MG tablet TAKE 1 TABLET (4 MG TOTAL) BY MOUTH AT BEDTIME  AS NEEDED FOR MUSCLE SPASMS. 30 tablet 0   No current facility-administered medications for this visit.     ROS:   General:  No weight loss, Fever, chills  HEENT: No recent headaches, no nasal bleeding, no visual changes, no sore throat  Neurologic: positive dizziness, blackouts, seizures. No recent symptoms of stroke or mini- stroke. No recent episodes of slurred speech, or temporary blindness.  Cardiac: No recent episodes of chest pain/pressure, no shortness of breath at rest.  No shortness of breath with exertion.  Denies history of atrial fibrillation or irregular heartbeat  Vascular: No history of rest pain in feet.  No history of claudication.  No history of non-healing ulcer, No history of DVT   Pulmonary: No home oxygen, no productive cough, no hemoptysis,  No asthma or wheezing  Musculoskeletal:  [x ] Arthritis, [ ]  Low back pain,  [ ]  Joint pain  Hematologic:No history of hypercoagulable state.  No history of easy bleeding.  No history of anemia  Gastrointestinal: No hematochezia or melena,  No gastroesophageal reflux, no trouble swallowing  Urinary: [ ]  chronic Kidney disease, [ ]  on HD - [ ]  MWF or [ ]  TTHS, [ ]  Burning with urination, [ ]  Frequent urination, [ ]  Difficulty urinating;   Skin: No rashes  Psychological: No history of anxiety,  No history of depression   Physical Examination  Vitals:   06/11/18 1527 06/11/18 1528  BP: (!) 153/68 (!) 159/70  Pulse: (!) 56   Resp: 18   SpO2: 95%   Weight: 168 lb 10.4 oz (76.5 kg)   Height: 5\' 10"  (1.778 m)     Body mass index is 24.2 kg/m.  General:  Alert and oriented, no acute distress HEENT: Normal,  normocephalic Neck: No bruit or JVD Pulmonary: Clear to auscultation bilaterally Cardiac: Regular Rate and Rhythm without murmur Gastrointestinal: Soft, non-tender, non-distended, no mass, no scars Skin: No rash Extremity Pulses:  2+ radial, brachial, femoral, right palpable dorsalis pedis,  Musculoskeletal: No deformity or edema  Neurologic: Upper and lower extremity motor intact  DATA:    Right Carotid Findings: +----------+--------+--------+--------+--------+--------+           PSV cm/sEDV cm/sStenosisDescribeComments +----------+--------+--------+--------+--------+--------+ CCA Prox  70      9                                +----------+--------+--------+--------+--------+--------+ CCA Mid   53      10                               +----------+--------+--------+--------+--------+--------+ CCA Distal                                stent    +----------+--------+--------+--------+--------+--------+ ICA Prox                                  stent    +----------+--------+--------+--------+--------+--------+ ICA Mid                                   stent    +----------+--------+--------+--------+--------+--------+ ICA Distal67      19                               +----------+--------+--------+--------+--------+--------+  ECA       322     16      >50%                     +----------+--------+--------+--------+--------+--------+   +---------+--------+--+--------+-+---------+ VertebralPSV cm/s27EDV cm/s7Antegrade +---------+--------+--+--------+-+---------+    Right Stent(s): +---------------+--+--++++ Prox to Stent  418  +---------------+--+--++++ Proximal Stent 5811 +---------------+--+--++++ Mid Stent      7621 +---------------+--+--++++ Distal Stent   7417 +---------------+--+--++++ Distal to [id number] +---------------+--+--++++     Left Carotid  Findings: +----------+--------+--------+--------+-------------------------+--------+           PSV cm/sEDV cm/sStenosisDescribe                 Comments +----------+--------+--------+--------+-------------------------+--------+ CCA Prox  104     11                                                +----------+--------+--------+--------+-------------------------+--------+ CCA Mid   61      12                                                +----------+--------+--------+--------+-------------------------+--------+ CCA Distal58      12              heterogenous                      +----------+--------+--------+--------+-------------------------+--------+ ICA Prox  281     74      60-79%  heterogenous and calcific         +----------+--------+--------+--------+-------------------------+--------+ ICA Mid   37      13                                                +----------+--------+--------+--------+-------------------------+--------+ ICA Distal54      17                                                +----------+--------+--------+--------+-------------------------+--------+ ECA       350     21      >50%                                      +----------+--------+--------+--------+-------------------------+--------+  +----------+--------+--------+----------------+-------------------+ SubclavianPSV cm/sEDV cm/sDescribe        Arm Pressure (mmHG) +----------+--------+--------+----------------+-------------------+           150     4       Multiphasic, WNL                    +----------+--------+--------+----------------+-------------------+  +---------+--------+--+--------+-+---------+ VertebralPSV cm/s40EDV cm/s8Antegrade +---------+--------+--+--------+-+---------+    Summary: Right Carotid: Patent stent with no evidence for restenosis. The ECA appears                >50% stenosed.  Left Carotid: Velocities in  the left ICA are consistent with a 60-79% stenosis.  The ECA appears >50% stenosed.  --+-----------+-----------+------------+------------+ ABI/TBIToday's ABIToday's TBIPrevious ABIPrevious TBI +-------+-----------+-----------+------------+------------+ Right  1.01       0.75       1.07        0.74         +-------+-----------+-----------+------------+------------+ Left   0.60       0.35       0.65        0.34         +-------+-----------+-----------+------------+------------+  ASSESSMENT:  Status post right carotid stent performed at [facility] in 2013by Dr. Harvel Ricks.  Status post right external iliac, common femoral, and profunda femoral artery endarterectomy with bovine pericardial patch angioplasty, followed by a right common femoral to below knee popliteal artery bypass graft with 6 mm Gore-Texon 08-03-15 by Dr. Brunilda Payor a right heel ulcer and a right great toe ulcer, both of which have healed.    He is doing well over all with out symptoms of claudication and no symptoms of stroke or TIA.  He continues to take Aspirin, Plavix andCcrestor daily  PLAN: F/U in 6 months for repeat ABI's and carotid duplex.  Today's study shows no significant change in the left LE ABI's with know PAD and no history of non healing ulcer.  The stent has no evidence of restenosis and the left is stable with stenosis < 79%.     Roxy Horseman PA-C [facility] Office: [phone]  MD in clinic [place]

## 2018-06-26 DIAGNOSIS — G4733 Obstructive sleep apnea (adult) (pediatric): Secondary | ICD-10-CM | POA: Diagnosis not present

## 2018-07-01 ENCOUNTER — Ambulatory Visit: Payer: Medicare HMO | Admitting: Cardiology

## 2018-07-13 ENCOUNTER — Ambulatory Visit: Payer: Medicare HMO | Admitting: Cardiology

## 2018-07-26 DIAGNOSIS — G4733 Obstructive sleep apnea (adult) (pediatric): Secondary | ICD-10-CM | POA: Diagnosis not present

## 2018-08-03 ENCOUNTER — Ambulatory Visit: Payer: Medicare HMO | Admitting: Cardiology

## 2018-08-10 DIAGNOSIS — G4733 Obstructive sleep apnea (adult) (pediatric): Secondary | ICD-10-CM | POA: Diagnosis not present

## 2018-08-24 ENCOUNTER — Ambulatory Visit: Payer: Medicare HMO | Admitting: Cardiology

## 2018-08-26 DIAGNOSIS — G4733 Obstructive sleep apnea (adult) (pediatric): Secondary | ICD-10-CM | POA: Diagnosis not present

## 2018-09-21 DIAGNOSIS — I209 Angina pectoris, unspecified: Secondary | ICD-10-CM | POA: Diagnosis not present

## 2018-09-21 DIAGNOSIS — I739 Peripheral vascular disease, unspecified: Secondary | ICD-10-CM | POA: Diagnosis not present

## 2018-09-21 DIAGNOSIS — E785 Hyperlipidemia, unspecified: Secondary | ICD-10-CM | POA: Diagnosis not present

## 2018-09-21 DIAGNOSIS — I10 Essential (primary) hypertension: Secondary | ICD-10-CM | POA: Diagnosis not present

## 2018-09-21 DIAGNOSIS — N183 Chronic kidney disease, stage 3 (moderate): Secondary | ICD-10-CM | POA: Diagnosis not present

## 2018-09-21 DIAGNOSIS — M25519 Pain in unspecified shoulder: Secondary | ICD-10-CM | POA: Diagnosis not present

## 2018-09-21 DIAGNOSIS — R7301 Impaired fasting glucose: Secondary | ICD-10-CM | POA: Diagnosis not present

## 2018-09-21 DIAGNOSIS — Z9861 Coronary angioplasty status: Secondary | ICD-10-CM | POA: Diagnosis not present

## 2018-09-25 DIAGNOSIS — G4733 Obstructive sleep apnea (adult) (pediatric): Secondary | ICD-10-CM | POA: Diagnosis not present

## 2018-10-01 ENCOUNTER — Encounter: Payer: Self-pay | Admitting: *Deleted

## 2018-10-03 NOTE — Progress Notes (Signed)
Virtual Visit via Video Note   This visit type was conducted due to national recommendations for restrictions regarding the COVID-19 Pandemic (e.g. social distancing) in an effort to limit this patient's exposure and mitigate transmission in our community.  Due to his co-morbid illnesses, this patient is at least at moderate risk for complications without adequate follow up.  This format is felt to be most appropriate for this patient at this time.  All issues noted in this document were discussed and addressed.  A limited physical exam was performed with this format.  Please refer to the patient's chart for his consent to telehealth for [facility]   Date:  10/04/2018   ID:  Ian Cummings, DOB December 24, 1928, MRN [id number]  Patient Location: Home Provider Location: Office  PCP:  Marton Redwood, MD  Cardiologist:  Shirlee More, MD  Electrophysiologist:  None   Evaluation Performed:  Follow-Up Visit  Chief Complaint:  CAD had seen Dr Wynonia Lawman  History of Present Illness:    Ian Cummings is a 85 y.o. male with artery disease hyperlipidemia peripheral vascular disease carotid artery disease and a history of bypass surgery.  He had bypass surgery initially 1995 and redo 2014.  Other problems have included carotid stenosis with stent.  He is followed by vascular surgery in [place]. He was last seen by Dr. Wynonia Lawman 01/01/2018.  At that time he was having no anginal discomfort and initiated treatment for sleep apnea with a CPAP device.  His most recent lipid profile from 01/21/2017 a total cholesterol 109 LDL of 51 HDL of 34.  He had an echocardiogram performed 08/15/2016 which showed moderate concentric LVH EF 55% moderate left atrial enlargement mild right atrial enlargement mild to moderate mitral moderate tricuspid regurgitation.  The EKG done in Dr. Thurman Coyer office 06/23/2017 showed sinus rhythm and was normal which I independently reviewed  The patient does not have symptoms concerning for  COVID-19 infection (fever, chills, cough, or new shortness of breath).   He is sheltered at [employer] and is enjoying life he is having no angina palpitation or syncope but is very limited in his ambulation with hip pain and just cannot exercise on a regular basis.  We will access his recent labs from his primary care physician he does not need refills of medications and I will plan to see him in the office in October for face-to-face visit and an EKG.   Past Medical History:  Diagnosis Date  . AAA (abdominal aortic aneurysm) ([place])    2004, Pt stated not there now  . Anginal pain ([place])   . Anxiety   . Arthritis   . BPH (benign prostatic hypertrophy)   . CAD (coronary artery disease)   . Carotid artery disease ([place])    Right carotid stent 2013  . Claudication ([place])   . Colon polyps    adenomatous  . Complex sleep apnea syndrome 11/11/2017  . Depression   . GERD (gastroesophageal reflux disease)   . Gout   . H/O hiatal hernia   . Hyperlipidemia   . Hypertension   . Hypothyroidism   . MI (myocardial infarction) ([place]) 4/14  . Neuropathy    Hx: of in B/L toes  . Peripheral vascular disease ([place])    with claudication  . Pre-diabetes   . Stroke ([place])   . TIA (transient ischemic attack)    Hx: of   Past Surgical History:  Procedure Laterality Date  . CAROTID STENT INSERTION Right 2013  right at [facility]  . CATARACT EXTRACTION Bilateral    Hx: of both eyes  . COLONOSCOPY     Hx: of  . COLONOSCOPY    . CORONARY ANGIOPLASTY WITH STENT PLACEMENT  2010  . CORONARY ARTERY BYPASS GRAFT  1995  . CORONARY ARTERY BYPASS GRAFT N/A 10/18/2012   Procedure: REDO CORONARY ARTERY BYPASS GRAFTING (CABG);  Surgeon: Grace Isaac, MD;  Location: [place];  Service: Open Heart Surgery;  Laterality: N/A;  off pump times two using endoscopically harvested left saphenous vein  . CORONARY STENT PLACEMENT  06/2016  . ENDARTERECTOMY FEMORAL Right 08/03/2015   Procedure: RIGHT FEMORAL  ENDARTERECTOMY WITH PATCH ANGIOPLASTY;  Surgeon: Serafina Mitchell, MD;  Location: [place];  Service: Vascular;  Laterality: Right;  . FEMORAL-POPLITEAL BYPASS GRAFT Right 08/03/2015   Procedure: RIGHT FEMORAL-BELOW KNEE POPLITEAL ARTERY BYPASS GRAFT;  Surgeon: Serafina Mitchell, MD;  Location: [place];  Service: Vascular;  Laterality: Right;  . FOOT SURGERY Left   . FRACTURE SURGERY Left    Hx: of Left heel  . INGUINAL HERNIA REPAIR Bilateral    X 2   . INTRAOPERATIVE TRANSESOPHAGEAL ECHOCARDIOGRAM N/A 10/18/2012   Procedure: INTRAOPERATIVE TRANSESOPHAGEAL ECHOCARDIOGRAM;  Surgeon: Grace Isaac, MD;  Location: [place];  Service: Open Heart Surgery;  Laterality: N/A;  . LEFT HEART CATHETERIZATION WITH CORONARY/GRAFT ANGIOGRAM N/A 02/02/2013   Procedure: LEFT HEART CATHETERIZATION WITH Beatrix Fetters;  Surgeon: Jacolyn Reedy, MD;  Location: [facility] CATH LAB;  Service: Cardiovascular;  Laterality: N/A;  . LOWER EXTREMITY ANGIOGRAM Right 06/27/2015   Procedure: Lower Extremity Angiogram;  Surgeon: Serafina Mitchell, MD;  Location: [place] CV LAB;  Service: Cardiovascular;  Laterality: Right;  . MASTOIDECTOMY  1933  . PERIPHERAL VASCULAR CATHETERIZATION N/A 01/23/2015   Procedure: Abdominal Aortogram;  Surgeon: Serafina Mitchell, MD;  Location: [place] CV LAB;  Service: Cardiovascular;  Laterality: N/A;  . PERIPHERAL VASCULAR CATHETERIZATION N/A 06/27/2015   Procedure: Abdominal Aortogram;  Surgeon: Serafina Mitchell, MD;  Location: [place] CV LAB;  Service: Cardiovascular;  Laterality: N/A;  . RETINAL DETACHMENT SURGERY Left    Hx: of left eye  . TONSILLECTOMY       Current Meds  Medication Sig  . allopurinol (ZYLOPRIM) 100 MG tablet Take 100 mg by mouth daily.   Marland Kitchen amLODipine (NORVASC) 5 MG tablet Take 5 mg by mouth daily.   Marland Kitchen aspirin EC 81 MG tablet Take 81 mg by mouth daily.   . B Complex Vitamins (B COMPLEX 1 PO) Take 1 tablet by mouth daily.  . Calcium Carb-Cholecalciferol (CALCIUM 500  + D3 PO) Take 1 tablet by mouth daily.  . clopidogrel (PLAVIX) 75 MG tablet Take 75 mg by mouth daily.  Marland Kitchen escitalopram (LEXAPRO) 10 MG tablet Take 10 mg by mouth daily.  . isosorbide mononitrate (IMDUR) 30 MG 24 hr tablet Take 30 mg by mouth daily.   Marland Kitchen levothyroxine (SYNTHROID, LEVOTHROID) 88 MCG tablet Take 88 mcg by mouth daily before breakfast.  . lisinopril (PRINIVIL,ZESTRIL) 20 MG tablet Take 20 mg by mouth daily.  . metoprolol succinate (TOPROL-XL) 50 MG 24 hr tablet Take 50 mg by mouth daily.  . multivitamin (RENA-VIT) TABS tablet Take 1 tablet by mouth daily.  Marland Kitchen omeprazole (PRILOSEC) 20 MG capsule Take 20 mg by mouth daily.  Marland Kitchen RANEXA 500 MG 12 hr tablet Take 500 mg by mouth 2 (two) times daily.   . rosuvastatin (CRESTOR) 40 MG tablet Take 40 mg by  mouth daily.     Allergies:   Amoxicillin and Metoprolol   Social History   Tobacco Use  . Smoking status: Former Smoker    Types: Cigarettes    Last attempt to quit: 04/29/1975    Years since quitting: 43.4  . Smokeless tobacco: Never Used  Substance Use Topics  . Alcohol use: Yes    Alcohol/week: 3.0 standard drinks    Types: 3 Glasses of wine per week    Comment: 3 glasses of wine today  . Drug use: No     Family Hx: The patient's family history includes Diabetes in his father; Heart attack in his mother; Heart disease in his father and mother; Hyperlipidemia in his father; Hypertension in his father; Stroke in his sister. There is no history of Colon cancer, Stomach cancer, Rectal cancer, Esophageal cancer, Liver disease, or Kidney disease.  ROS:   Please see the history of present illness.     All other systems reviewed and are negative.   Prior CV studies:   The following studies were reviewed today:    Labs/Other Tests and Data Reviewed:    EKG:  No ECG reviewed.  Recent Labs: No results found for requested labs within last 8760 hours.   Recent Lipid Panel Lab Results  Component Value Date/Time   CHOL   04/18/2009 02:41 AM    109        ATP III CLASSIFICATION:  <200     mg/dL   Desirable  200-239  mg/dL   Borderline High  >=240    mg/dL   High          TRIG 114 04/18/2009 02:41 AM   HDL 34 (L) 04/18/2009 02:41 AM   CHOLHDL 3.2 04/18/2009 02:41 AM   LDLCALC  04/18/2009 02:41 AM    52        Total Cholesterol/HDL:CHD Risk Coronary Heart Disease Risk Table                     Men   Women  1/2 Average Risk   3.4   3.3  Average Risk       5.0   4.4  2 X Average Risk   9.6   7.1  3 X Average Risk  23.4   11.0        Use the calculated Patient Ratio above and the CHD Risk Table to determine the patient's CHD Risk.        ATP III CLASSIFICATION (LDL):  <100     mg/dL   Optimal  100-129  mg/dL   Near or Above                    Optimal  130-159  mg/dL   Borderline  160-189  mg/dL   High  >190     mg/dL   Very High    Wt Readings from Last 3 Encounters:  10/04/18 170 lb (77.1 kg)  06/11/18 168 lb 10.4 oz (76.5 kg)  02/17/18 165 lb 8 oz (75.1 kg)     Objective:    Vital Signs:  BP (!) 146/75 (BP Location: Left Arm, Patient Position: Sitting)   Pulse 66   Wt 170 lb (77.1 kg)   BMI 24.39 kg/m    VITAL SIGNS:  reviewed GEN:  no acute distress EYES:  sclerae anicteric, EOMI - Extraocular Movements Intact RESPIRATORY:  normal respiratory effort, symmetric expansion CARDIOVASCULAR:  no peripheral edema SKIN:  no rash,  lesions or ulcers. MUSCULOSKELETAL:  no obvious deformities. NEURO:  alert and oriented x 3, no obvious focal deficit PSYCH:  normal affect   ASSESSMENT & PLAN:    1. Coronary artery disease stable having no angina on maximal medical therapy including dual antiplatelet high intensity statin beta-blocker oral nitrates and ranolazine.  At this time I would not pursue an ischemia evaluation unless symptomatic or ACS. 2. Hypertension stable continue current treatment 3. Hyperlipidemia stable on a high intensity statin I will access recent labs for liver  function safety lipid profile efficacy and if LDL is above 70 and strongly consider PCSK9 therapy with diffuse atherosclerosis 4. Bradycardia stable continue beta-blocker check EKG at next visit  COVID-19 Education: The signs and symptoms of COVID-19 were discussed with the patient and how to seek care for testing (follow up with PCP or arrange E-visit).  The importance of social distancing was discussed today.  Time:   Today, I have spent 20 minutes with the patient with telehealth technology discussing the above problems.     Medication Adjustments/Labs and Tests Ordered: Current medicines are reviewed at length with the patient today.  Concerns regarding medicines are outlined above.   Tests Ordered: No orders of the defined types were placed in this encounter.   Medication Changes: No orders of the defined types were placed in this encounter.   Disposition:  Follow up October 2020  SignedShirlee More, MD  10/04/2018 10:59 AM    [facility]

## 2018-10-04 ENCOUNTER — Other Ambulatory Visit: Payer: Self-pay

## 2018-10-04 ENCOUNTER — Encounter: Payer: Self-pay | Admitting: Cardiology

## 2018-10-04 ENCOUNTER — Telehealth: Payer: Self-pay | Admitting: Cardiology

## 2018-10-04 ENCOUNTER — Encounter: Payer: Self-pay | Admitting: *Deleted

## 2018-10-04 ENCOUNTER — Telehealth (INDEPENDENT_AMBULATORY_CARE_PROVIDER_SITE_OTHER): Payer: Medicare HMO | Admitting: Cardiology

## 2018-10-04 VITALS — BP 146/75 | HR 66 | Wt 170.0 lb

## 2018-10-04 DIAGNOSIS — E782 Mixed hyperlipidemia: Secondary | ICD-10-CM

## 2018-10-04 DIAGNOSIS — R001 Bradycardia, unspecified: Secondary | ICD-10-CM

## 2018-10-04 DIAGNOSIS — I25118 Atherosclerotic heart disease of native coronary artery with other forms of angina pectoris: Secondary | ICD-10-CM | POA: Diagnosis not present

## 2018-10-04 DIAGNOSIS — I129 Hypertensive chronic kidney disease with stage 1 through stage 4 chronic kidney disease, or unspecified chronic kidney disease: Secondary | ICD-10-CM

## 2018-10-04 NOTE — Patient Instructions (Signed)
Medication Instructions:  Your physician recommends that you continue on your current medications as directed. Please refer to the Current Medication list given to you today.  If you need a refill on your cardiac medications before your next appointment, please call your pharmacy.   Lab work: NOne If you have labs (blood work) drawn today and your tests are completely normal, you will receive your results only by: Marland Kitchen MyChart Message (if you have MyChart) OR . A paper copy in the mail If you have any lab test that is abnormal or we need to change your treatment, we will call you to review the results.  Testing/Procedures: NOne  Follow-Up: At [facility], you and your health needs are our priority.  As part of our continuing mission to provide you with exceptional heart care, we have created designated Provider Care Teams.  These Care Teams include your primary Cardiologist (physician) and Advanced Practice Providers (APPs -  Physician Assistants and Nurse Practitioners) who all work together to provide you with the care you need, when you need it. You will need a follow up appointment in 4 months. Any Other Special Instructions Will Be Listed Below (If Applicable).

## 2018-10-04 NOTE — Telephone Encounter (Signed)
Virtual Visit Pre-Appointment Phone Call  "(Name), I am calling you today to discuss your upcoming appointment. We are currently trying to limit exposure to the virus that causes COVID-19 by seeing patients at home rather than in the office."  1. "What is the BEST phone number to call the day of the visit?" - include this in appointment notes  2. Do you have or have access to (through a family member/friend) a smartphone with video capability that we can use for your visit?" a. If yes - list this number in appt notes as cell (if different from BEST phone #) and list the appointment type as a VIDEO visit in appointment notes b. If no - list the appointment type as a PHONE visit in appointment notes  3. Confirm consent - "In the setting of the current Covid19 crisis, you are scheduled for a (phone or video) visit with your provider on (date) at (time).  Just as we do with many in-office visits, in order for you to participate in this visit, we must obtain consent.  If you'd like, I can send this to your mychart (if signed up) or email for you to review.  Otherwise, I can obtain your verbal consent now.  All virtual visits are billed to your insurance company just like a normal visit would be.  By agreeing to a virtual visit, we'd like you to understand that the technology does not allow for your provider to perform an examination, and thus may limit your provider's ability to fully assess your condition. If your provider identifies any concerns that need to be evaluated in person, we will make arrangements to do so.  Finally, though the technology is pretty good, we cannot assure that it will always work on either your or our end, and in the setting of a video visit, we may have to convert it to a phone-only visit.  In either situation, we cannot ensure that we have a secure connection.  Are you willing to proceed?" STAFF: Did the patient verbally acknowledge consent to telehealth visit? Document  YES/NO here: Yes  4. Advise patient to be prepared - "Two hours prior to your appointment, go ahead and check your blood pressure, pulse, oxygen saturation, and your weight (if you have the equipment to check those) and write them all down. When your visit starts, your provider will ask you for this information. If you have an Apple Watch or Kardia device, please plan to have heart rate information ready on the day of your appointment. Please have a pen and paper handy nearby the day of the visit as well."  5. Give patient instructions for MyChart download to smartphone OR Doximity/Doxy.me as below if video visit (depending on what platform provider is using)  6. Inform patient they will receive a phone call 15 minutes prior to their appointment time (may be from unknown caller ID) so they should be prepared to answer    TELEPHONE CALL NOTE  Ian Cummings has been deemed a candidate for a follow-up tele-health visit to limit community exposure during the Covid-19 pandemic. I spoke with the patient via phone to ensure availability of phone/video source, confirm preferred email & phone number, and discuss instructions and expectations.  I reminded Ian Cummings to be prepared with any vital sign and/or heart rhythm information that could potentially be obtained via home monitoring, at the time of his visit. I reminded Ian Cummings to expect a phone call prior to  his visit.  Calla Kicks 10/04/2018 10:40 AM   INSTRUCTIONS FOR DOWNLOADING THE MYCHART APP TO SMARTPHONE  - The patient must first make sure to have activated MyChart and know their login information - If Apple, go to [employer] and type in MyChart in the search bar and download the app. If Android, ask patient to go to [employer] and type in [place] in the search bar and download the app. The app is free but as with any other app downloads, their phone may require them to verify saved payment information or Apple/Android  password.  - The patient will need to then log into the app with their MyChart username and password, and select [facility] as their healthcare provider to link the account. When it is time for your visit, go to the MyChart app, find appointments, and click Begin Video Visit. Be sure to Select Allow for your device to access the Microphone and Camera for your visit. You will then be connected, and your provider will be with you shortly.  **If they have any issues connecting, or need assistance please contact MyChart service desk [phone])**  **If using a computer, in order to ensure the best quality for their visit they will need to use either of the following Internet Browsers: [employer], or Google Chrome**  IF USING DOXIMITY or DOXY.ME - The patient will receive a link just prior to their visit by text.     FULL LENGTH CONSENT FOR TELE-HEALTH VISIT   I hereby voluntarily request, consent and authorize [facility] and its employed or contracted physicians, physician assistants, nurse practitioners or other licensed health care professionals (the Practitioner), to provide me with telemedicine health care services (the Services") as deemed necessary by the treating Practitioner. I acknowledge and consent to receive the Services by the Practitioner via telemedicine. I understand that the telemedicine visit will involve communicating with the Practitioner through live audiovisual communication technology and the disclosure of certain medical information by electronic transmission. I acknowledge that I have been given the opportunity to request an in-person assessment or other available alternative prior to the telemedicine visit and am voluntarily participating in the telemedicine visit.  I understand that I have the right to withhold or withdraw my consent to the use of telemedicine in the course of my care at any time, without affecting my right to future care or treatment,  and that the Practitioner or I may terminate the telemedicine visit at any time. I understand that I have the right to inspect all information obtained and/or recorded in the course of the telemedicine visit and may receive copies of available information for a reasonable fee.  I understand that some of the potential risks of receiving the Services via telemedicine include:   Delay or interruption in medical evaluation due to technological equipment failure or disruption;  Information transmitted may not be sufficient (e.g. poor resolution of images) to allow for appropriate medical decision making by the Practitioner; and/or   In rare instances, security protocols could fail, causing a breach of personal health information.  Furthermore, I acknowledge that it is my responsibility to provide information about my medical history, conditions and care that is complete and accurate to the best of my ability. I acknowledge that Practitioner's advice, recommendations, and/or decision may be based on factors not within their control, such as incomplete or inaccurate data provided by me or distortions of diagnostic images or specimens that may result from electronic transmissions. I  understand that the practice of medicine is not an exact science and that Practitioner makes no warranties or guarantees regarding treatment outcomes. I acknowledge that I will receive a copy of this consent concurrently upon execution via email to the email address I last provided but may also request a printed copy by calling the office of [facility].    I understand that my insurance will be billed for this visit.   I have read or had this consent read to me.  I understand the contents of this consent, which adequately explains the benefits and risks of the Services being provided via telemedicine.   I have been provided ample opportunity to ask questions regarding this consent and the Services and have had my questions  answered to my satisfaction.  I give my informed consent for the services to be provided through the use of telemedicine in my medical care  By participating in this telemedicine visit I agree to the above.

## 2018-10-26 DIAGNOSIS — G4733 Obstructive sleep apnea (adult) (pediatric): Secondary | ICD-10-CM | POA: Diagnosis not present

## 2018-11-25 DIAGNOSIS — G4733 Obstructive sleep apnea (adult) (pediatric): Secondary | ICD-10-CM | POA: Diagnosis not present

## 2018-12-13 ENCOUNTER — Telehealth: Payer: Self-pay | Admitting: Neurology

## 2018-12-13 ENCOUNTER — Telehealth: Payer: Self-pay

## 2018-12-13 DIAGNOSIS — G4739 Other sleep apnea: Secondary | ICD-10-CM

## 2018-12-13 DIAGNOSIS — I25701 Atherosclerosis of coronary artery bypass graft(s), unspecified, with angina pectoris with documented spasm: Secondary | ICD-10-CM

## 2018-12-13 DIAGNOSIS — G4719 Other hypersomnia: Secondary | ICD-10-CM

## 2018-12-13 DIAGNOSIS — G4733 Obstructive sleep apnea (adult) (pediatric): Secondary | ICD-10-CM

## 2018-12-13 DIAGNOSIS — Z9989 Dependence on other enabling machines and devices: Secondary | ICD-10-CM

## 2018-12-13 DIAGNOSIS — R5383 Other fatigue: Secondary | ICD-10-CM

## 2018-12-13 DIAGNOSIS — R001 Bradycardia, unspecified: Secondary | ICD-10-CM

## 2018-12-13 NOTE — Telephone Encounter (Signed)
  12-13-2018,  Patient called about the high AHI he developed wile using CPAP compliantly.  I reviewed the download that [facility] exam forwarded to me the patient has used his CPAP compliantly for 28 out of 30 days and 26 of these days over 4 hours consecutively.  Average user time is 7 hours 17 minutes, he is using an AutoSet CPAP between 7 and 15 cmH2O, 3 cm expiratory pressure relief was also given.  The 95th percentile pressure office currently referred pressure window is 11.6 cm water.  He does have high air leaks up to 34.0 L/min, his AHI is 6.4 on average but has an increasing tendency over the last 14 days.  Central apneas make 3.4 but obstructive apneas are only making 0.9 apneas per hour.  He is in Cheyne-Stokes respiration for 28 minutes or 6% of the user time.  Based on this I think the patient should return for a CPAP titration he has increasing trend of residual apneas, his apneas are central and he has high air leaks.

## 2018-12-13 NOTE — Addendum Note (Signed)
Addended by: Darleen Crocker on: 12/13/2018 02:31 PM   Modules accepted: Orders

## 2018-12-13 NOTE — Telephone Encounter (Signed)
Called the patient back, his wife answered. I reviewed with her what Dr Brett Fairy sees on the download. Advised the pt's wife that in this last 30 days he only had about 6 events. Informed what central sleep apnea was. Advised that Dr. Brett Fairy would recommend the patient to have a CPAP to Bipap titration in case the central apnea's are worsening.

## 2018-12-13 NOTE — Telephone Encounter (Signed)
Patient states that he had gotten a call from [facility] making him aware that he has been having about 13 events per hour. He wakes up in the morning feeling so tired. He wears his cpap every night.

## 2018-12-14 DIAGNOSIS — R0602 Shortness of breath: Secondary | ICD-10-CM | POA: Diagnosis not present

## 2018-12-14 DIAGNOSIS — M25519 Pain in unspecified shoulder: Secondary | ICD-10-CM | POA: Diagnosis not present

## 2018-12-14 DIAGNOSIS — T22232A Burn of second degree of left upper arm, initial encounter: Secondary | ICD-10-CM | POA: Diagnosis not present

## 2018-12-14 DIAGNOSIS — E039 Hypothyroidism, unspecified: Secondary | ICD-10-CM | POA: Diagnosis not present

## 2018-12-14 DIAGNOSIS — N183 Chronic kidney disease, stage 3 (moderate): Secondary | ICD-10-CM | POA: Diagnosis not present

## 2018-12-14 DIAGNOSIS — G4733 Obstructive sleep apnea (adult) (pediatric): Secondary | ICD-10-CM | POA: Diagnosis not present

## 2018-12-14 DIAGNOSIS — J449 Chronic obstructive pulmonary disease, unspecified: Secondary | ICD-10-CM | POA: Diagnosis not present

## 2018-12-14 DIAGNOSIS — R5383 Other fatigue: Secondary | ICD-10-CM | POA: Diagnosis not present

## 2018-12-14 DIAGNOSIS — I129 Hypertensive chronic kidney disease with stage 1 through stage 4 chronic kidney disease, or unspecified chronic kidney disease: Secondary | ICD-10-CM | POA: Diagnosis not present

## 2018-12-14 NOTE — Telephone Encounter (Signed)
Noted. We will continue to process the CPAP titration study through his insurance and call to hopefully get him scheduled as soon as we get approval.

## 2018-12-14 NOTE — Telephone Encounter (Signed)
Pt called to inform RN Myriam Jacobson that on his CPAP last night the reading was that he had 23 events an hour.  Pt will see PCP today

## 2018-12-16 DIAGNOSIS — I10 Essential (primary) hypertension: Secondary | ICD-10-CM | POA: Diagnosis not present

## 2018-12-16 DIAGNOSIS — M5412 Radiculopathy, cervical region: Secondary | ICD-10-CM | POA: Diagnosis not present

## 2018-12-17 NOTE — Telephone Encounter (Signed)
Pt is asking for a call from RN to discuss an increase in his events at night and another sleep study.  Please call

## 2018-12-20 NOTE — Telephone Encounter (Signed)
I spoke to patient on 12/16/2018. I tried scheduling his CPAP study but patient refused scheduling. I tried explaining to patient how important it was for him to schedule now. Pt was very adamant about not scheduling now and stated that he just started a new pain med and wanted to give time to adjust to it first. Pt stated he would call me back in a couple of weeks to schedule CPAP study. Pt was given my direct number to call back when ready to schedule

## 2018-12-20 NOTE — Telephone Encounter (Signed)
Called the patient back and he states his apnea events were decreased from previous night. I reviewed with him that he is still having an average of 6 central apnea events. I have advised the patient that if we were to increase the pressure that can actually cause the central apnea's to worsen. Pt verbalized understanding. Transferred the patient to sleep lab to get scheduled.

## 2018-12-30 DIAGNOSIS — I10 Essential (primary) hypertension: Secondary | ICD-10-CM | POA: Diagnosis not present

## 2018-12-30 DIAGNOSIS — M542 Cervicalgia: Secondary | ICD-10-CM | POA: Diagnosis not present

## 2018-12-30 DIAGNOSIS — E119 Type 2 diabetes mellitus without complications: Secondary | ICD-10-CM | POA: Diagnosis not present

## 2018-12-30 DIAGNOSIS — M25512 Pain in left shoulder: Secondary | ICD-10-CM | POA: Diagnosis not present

## 2018-12-31 ENCOUNTER — Telehealth: Payer: Self-pay | Admitting: Cardiology

## 2018-12-31 NOTE — Telephone Encounter (Signed)
Is having an injection in his neck and has been asked to stop the plavix 10 days before the injection -- he is calling to clear this with [facility]  Please call patient to discuss

## 2019-01-01 NOTE — Telephone Encounter (Signed)
He is a former patient of Dr. Wynonia Lawman with chronic stable CAD is been maintained on clopidogrel.  He tells me on 01/13/2019 he is to have spinal injection by Dr. Lovenia Shuck and was advised to stop his clopidogrel.  I spoke with him his CAD is stable no recent anginal discomfort and told him that his last dose of clopidogrel would be tomorrow.  He will ask his neurosurgeon when he can resume after the procedure.  He also needs to be scheduled to come in the office for continuity of care as Dr. Thurman Coyer practice has been closed now for 1 year.

## 2019-01-07 ENCOUNTER — Other Ambulatory Visit (HOSPITAL_COMMUNITY): Payer: Self-pay

## 2019-01-10 ENCOUNTER — Telehealth: Payer: Self-pay | Admitting: Cardiology

## 2019-01-10 NOTE — Telephone Encounter (Signed)
Patient has appt in October but reports he is having a lot of shortness of breath.

## 2019-01-10 NOTE — Telephone Encounter (Signed)
Telephone call to patient. No answer . Left message to return call. 

## 2019-01-11 DIAGNOSIS — H40013 Open angle with borderline findings, low risk, bilateral: Secondary | ICD-10-CM | POA: Diagnosis not present

## 2019-01-11 DIAGNOSIS — H524 Presbyopia: Secondary | ICD-10-CM | POA: Diagnosis not present

## 2019-01-11 DIAGNOSIS — H26491 Other secondary cataract, right eye: Secondary | ICD-10-CM | POA: Diagnosis not present

## 2019-01-11 DIAGNOSIS — E119 Type 2 diabetes mellitus without complications: Secondary | ICD-10-CM | POA: Diagnosis not present

## 2019-01-11 DIAGNOSIS — Z961 Presence of intraocular lens: Secondary | ICD-10-CM | POA: Diagnosis not present

## 2019-01-12 ENCOUNTER — Encounter: Payer: Self-pay | Admitting: Family

## 2019-01-12 ENCOUNTER — Ambulatory Visit (HOSPITAL_BASED_OUTPATIENT_CLINIC_OR_DEPARTMENT_OTHER)
Admission: RE | Admit: 2019-01-12 | Discharge: 2019-01-12 | Disposition: A | Discharge status: Home / Self Care | Payer: Medicare HMO | Source: Ambulatory Visit | Attending: Family | Admitting: Family

## 2019-01-12 ENCOUNTER — Ambulatory Visit (INDEPENDENT_AMBULATORY_CARE_PROVIDER_SITE_OTHER): Payer: Medicare HMO | Admitting: Family

## 2019-01-12 ENCOUNTER — Other Ambulatory Visit: Payer: Self-pay

## 2019-01-12 VITALS — BP 132/52 | HR 53 | Ht 70.0 in | Wt 166.4 lb

## 2019-01-12 DIAGNOSIS — R0609 Other forms of dyspnea: Secondary | ICD-10-CM | POA: Insufficient documentation

## 2019-01-12 DIAGNOSIS — R5383 Other fatigue: Secondary | ICD-10-CM | POA: Diagnosis not present

## 2019-01-12 DIAGNOSIS — I4510 Unspecified right bundle-branch block: Secondary | ICD-10-CM

## 2019-01-12 DIAGNOSIS — I25118 Atherosclerotic heart disease of native coronary artery with other forms of angina pectoris: Secondary | ICD-10-CM | POA: Diagnosis not present

## 2019-01-12 DIAGNOSIS — E782 Mixed hyperlipidemia: Secondary | ICD-10-CM

## 2019-01-12 DIAGNOSIS — I10 Essential (primary) hypertension: Secondary | ICD-10-CM | POA: Insufficient documentation

## 2019-01-12 DIAGNOSIS — R001 Bradycardia, unspecified: Secondary | ICD-10-CM | POA: Insufficient documentation

## 2019-01-12 DIAGNOSIS — I2510 Atherosclerotic heart disease of native coronary artery without angina pectoris: Secondary | ICD-10-CM | POA: Insufficient documentation

## 2019-01-12 HISTORY — DX: Essential (primary) hypertension: I10

## 2019-01-12 HISTORY — DX: Bradycardia, unspecified: R00.1

## 2019-01-12 HISTORY — DX: Unspecified right bundle-branch block: I45.10

## 2019-01-12 NOTE — Progress Notes (Signed)
Office Visit    Patient Name: Ian Cummings Date of Encounter: 01/12/2019  Primary Care Provider:  Marton Redwood, MD Primary Cardiologist:  Shirlee More, MD Electrophysiologist:  None   Chief Complaint    Ian Cummings is a 85 y.o. male with a hx of CAD s/p CABG, hypothyroidism, HLD, HTN  presents today for shortness of breath   Past Medical History    Past Medical History:  Diagnosis Date  . AAA (abdominal aortic aneurysm) ([place])    2004, Pt stated not there now  . Anginal pain ([place])   . Anxiety   . Arthritis   . BPH (benign prostatic hypertrophy)   . CAD (coronary artery disease)   . Carotid artery disease ([place])    Right carotid stent 2013  . Claudication ([place])   . Colon polyps    adenomatous  . Complex sleep apnea syndrome 11/11/2017  . Coronary artery disease   . Depression   . GERD (gastroesophageal reflux disease)   . Gout   . H/O hiatal hernia   . Hyperlipidemia   . Hypertension   . Hypothyroidism   . MI (myocardial infarction) ([place]) 4/14  . Neuropathy    Hx: of in B/L toes  . Peripheral vascular disease ([place])    with claudication  . Pre-diabetes   . Stroke ([place])   . TIA (transient ischemic attack)    Hx: of   Past Surgical History:  Procedure Laterality Date  . CAROTID STENT INSERTION Right 2013   right at [facility]  . CATARACT EXTRACTION Bilateral    Hx: of both eyes  . COLONOSCOPY     Hx: of  . COLONOSCOPY    . CORONARY ANGIOPLASTY WITH STENT PLACEMENT  2010  . CORONARY ARTERY BYPASS GRAFT  1995  . CORONARY ARTERY BYPASS GRAFT N/A 10/18/2012   Procedure: REDO CORONARY ARTERY BYPASS GRAFTING (CABG);  Surgeon: Grace Isaac, MD;  Location: [place];  Service: Open Heart Surgery;  Laterality: N/A;  off pump times two using endoscopically harvested left saphenous vein  . CORONARY STENT PLACEMENT  06/2016  . ENDARTERECTOMY FEMORAL Right 08/03/2015   Procedure: RIGHT FEMORAL ENDARTERECTOMY WITH PATCH ANGIOPLASTY;  Surgeon: Serafina Mitchell,  MD;  Location: [facility];  Service: Vascular;  Laterality: Right;  . FEMORAL-POPLITEAL BYPASS GRAFT Right 08/03/2015   Procedure: RIGHT FEMORAL-BELOW KNEE POPLITEAL ARTERY BYPASS GRAFT;  Surgeon: Serafina Mitchell, MD;  Location: [facility];  Service: Vascular;  Laterality: Right;  . FOOT SURGERY Left   . FRACTURE SURGERY Left    Hx: of Left heel  . INGUINAL HERNIA REPAIR Bilateral    X 2   . INTRAOPERATIVE TRANSESOPHAGEAL ECHOCARDIOGRAM N/A 10/18/2012   Procedure: INTRAOPERATIVE TRANSESOPHAGEAL ECHOCARDIOGRAM;  Surgeon: Grace Isaac, MD;  Location: [facility];  Service: Open Heart Surgery;  Laterality: N/A;  . LEFT HEART CATHETERIZATION WITH CORONARY/GRAFT ANGIOGRAM N/A 02/02/2013   Procedure: LEFT HEART CATHETERIZATION WITH Beatrix Fetters;  Surgeon: Jacolyn Reedy, MD;  Location[facility] C[facility] LAB;  Service: Cardiovascular;  Laterality: N/A;  . LOWER EXTREMITY ANGIOGRAM Right 06/27/2015   Procedure: Lower Extremity Angiogram;  Surgeon: Serafina Mitchell, MD;  Location: [facility] CV LAB;  Service: Cardiovascular;  Laterality: Right;  . MASTOIDECTOMY  1933  . PERIPHERAL VASCULAR CATHETERIZATION N/A 01/23/2015   Procedure: Abdominal Aortogram;  Surgeon: Serafina Mitchell, MD;  Location: [facility] CV LAB;  Service: Cardiovascular;  Laterality: N/A;  . PERIPHERAL VASCULAR CATHETERIZATION N/A 06/27/2015   Procedure:  Abdominal Aortogram;  Surgeon: Serafina Mitchell, MD;  Location: [facility] CV LAB;  Service: Cardiovascular;  Laterality: N/A;  . RETINAL DETACHMENT SURGERY Left    Hx: of left eye  . TONSILLECTOMY      Allergies  Allergies  Allergen Reactions  . Amoxicillin Rash  . Metoprolol Itching    History of Present Illness    Ian Cummings is a 85 y.o. male with a hx of CAD (s/p CABGx4-5 in 1995 and repeat CABGx1 in 2014, s/p PCI and stent 06/2016), HLD, PVD, carotid artery disease (s/p carotid stent 2013 at [facility]). Echo 08/15/16 with mod concentric LVH, EF 55%, moderate LAE, mild RAE, mild to moderate  MR, moderate TR. He was last seen by Dr. Bettina Gavia 10/04/18. He presents today for shortness of breath.   Last month reports SOB. Worsening over the last 2 weeks. Had been doing some spray painting of wicker furniture before he and his wife move and attributed it to this, but tells me it is not getting better. Some SOB at rest but mostly when he tries to do things (with exertion). He notices it especially when he "gets up in a hurry". Last a few minutes or seconds. Better with deep breaths.   He reports a "slight" cough that is not productive. No wheezing. Tells me the other day when he was short of breath he heard his chest "rattle".   Reports fatigue worsening over the last two weeks. Supposed to have an injection in his neck tomorrow and asks if it is still safe to proceed, encouraged to keep that appointment as his neck causes him "terrible arm and shoulder pain".   Reports at home his is HR normally 50s. SBP normally 140s - he checks very intermittently.   Tells me his CPAP has notified him of more events per evening. Has follow up scheduled with sleep medicine next month. He cancelled his sleep study and I encouraged him to call their office to see if he should reschedule or wait until after his appointment.   EKGs/Labs/Other Studies Reviewed:   The following studies were reviewed today:  EKG:  EKG is  ordered today.  The ekg ordered today demonstrates SB rate 48 bpm with incomplete RBBB. Somewhat difficult interpretation of leads II, III due to artifact.   Recent Labs: No results found for requested labs within last 8760 hours.  Recent Lipid Panel   Home Medications   Current Meds  Medication Sig  . allopurinol (ZYLOPRIM) 100 MG tablet Take 100 mg by mouth daily.   Marland Kitchen amLODipine (NORVASC) 5 MG tablet Take 5 mg by mouth daily.   Marland Kitchen aspirin EC 81 MG tablet Take 81 mg by mouth daily.   . B Complex Vitamins (B COMPLEX 1 PO) Take 1 tablet by mouth daily.  . clopidogrel (PLAVIX) 75 MG tablet  Take 75 mg by mouth daily.  Marland Kitchen escitalopram (LEXAPRO) 10 MG tablet Take 10 mg by mouth daily.  . isosorbide mononitrate (IMDUR) 30 MG 24 hr tablet Take 30 mg by mouth daily.   Marland Kitchen levothyroxine (SYNTHROID, LEVOTHROID) 88 MCG tablet Take 88 mcg by mouth daily before breakfast.  . lisinopril (PRINIVIL,ZESTRIL) 20 MG tablet Take 20 mg by mouth daily.  . metoprolol succinate (TOPROL-XL) 50 MG 24 hr tablet Take 50 mg by mouth daily.  . multivitamin (RENA-VIT) TABS tablet Take 1 tablet by mouth daily.  Marland Kitchen omeprazole (PRILOSEC) 20 MG capsule Take 20 mg by mouth daily.  Marland Kitchen RANEXA 500 MG 12  hr tablet Take 500 mg by mouth 2 (two) times daily.   . rosuvastatin (CRESTOR) 40 MG tablet Take 40 mg by mouth daily.    Review of Systems    Review of Systems  Constitution: Positive for malaise/fatigue. Negative for chills and fever.  Cardiovascular: Positive for dyspnea on exertion. Negative for chest pain, leg swelling and near-syncope.  Respiratory: Positive for cough and shortness of breath. Negative for wheezing.   Musculoskeletal: Positive for neck pain.  Gastrointestinal: Negative for nausea and vomiting.  Neurological: Negative for dizziness, light-headedness and weakness.   All other systems reviewed and are otherwise negative except as noted above.  Physical Exam    VS:  BP (!) 132/52   Pulse (!) 53   Ht 5\' 10"  (1.778 m)   Wt 166 lb 6.4 oz (75.5 kg)   SpO2 93%   BMI 23.88 kg/m  , BMI Body mass index is 23.88 kg/m. GEN: Well nourished, well developed, in no acute distress. HEENT: normal. Neck: Supple, no JVD, carotid bruits, or masses. Cardiac: RRR, no murmurs, rubs, or gallops. No clubbing, cyanosis, edema.  Radials/DP/PT 2+ and equal bilaterally.  Respiratory:  Respirations regular and unlabored, clear to auscultation bilaterally. Diminished bases bilaterally.  GI: Soft, nontender, nondistended, BS + x 4. MS: No deformity or atrophy. Skin: Warm and dry, no rash. Neuro:  Strength and  sensation are intact. Psych: Normal affect.  Assessment & Plan    1. DOE -onset 1 month ago, worsening over the last 2 weeks, some shortness of breath at rest but mainly DOE.  Improves with deep breathing and rest.  Typically lasts 5 to 10 minutes.  Minimal nonproductive cough.  No wheezing.  He attributed it to spray painting some wicker furniture but symptoms have persisted.   Lung sounds clear on exam with bases diminished bilaterally. Oxygen saturation 93% on room air.   Etiology HF vs valvular dysfunction vs pulmonary origin vs URI vs anemia.  CXR today   He will be scheduled for echocardiogram.   CBC, BMET today  2. CAD - Reports no chest pain.  Reports the symptoms today are different than prior to his previous CABG.  EKG today sinus bradycardia with no noted acute ST/T wave changes.  Continue GDMT of aspirin, beta-blocker, statin.   3. Fatigue - Reports worsening fatigue over the last 2 weeks. Notes he has been sleeping poorly due to pain and is scheduled for an injection in his neck tomorrow.  Etiology of OSA versus HF versus anemia versus sleep deprivation. Labs 11/2018 by PCP with TSH 2.8, Hb 12.5.   CBC, BMET today  Low suspicion thyroid etiology as TSH 11/2018 was normal at 2.8 and no changes to his levothyroxine dose.   4. OSA - Reports compliance with his CPAP.  Does notice increased report of apneic episodes per his CPAP.  This could be contributory to his fatigue.  He has follow-up scheduled with sleep medicine next month.  He did cancel a sleep study and I encouraged him to reach out to them to find if he needs to reschedule prior to his follow-up appointment.  5. HLD, LDL goal <70 - Labs 11/2018 by PCP with LDL 55, ALT 22. Continue statin.   6. Bradycardia - HR on EKG 48 today SB. JR 53 on pulse oximetry. Notes HR at home routinely in the 93s. Tells me his heart rates have been this for a long time. No dizziness, lightheadedness, syncope.  Pending results of testing will  consider  decreasing his Metoprolol to 25mg  daily.   7. RBBB - New finding on EKG. Denies dizziness, lightheadedness, syncope. Continue to monitor with EKG at least annually.   8. HTN - Checks BP intermittently at home with systolics in the 0000000 per his report. BP today 132/52. Continue present antihypertensive regimen.   Disposition: Follow up in 1 month(s) with Dr. Harriet Masson.    Loel Dubonnet, NP 01/12/2019, 3:57 PM

## 2019-01-12 NOTE — Patient Instructions (Signed)
Medication Instructions:  No medication changes today.   If you need a refill on your cardiac medications before your next appointment, please call your pharmacy.   Lab work: Your physician recommends that you return for lab work today: CBC, BMET  If you have labs (blood work) drawn today and your tests are completely normal, you will receive your results only by: Marland Kitchen MyChart Message (if you have MyChart) OR . A paper copy in the mail If you have any lab test that is abnormal or we need to change your treatment, we will call you to review the results.  Testing/Procedures: Your physician has requested that you have an echocardiogram. Echocardiography is a painless test that uses sound waves to create images of your heart. It provides your doctor with information about the size and shape of your heart and how well your heart's chambers and valves are working. This procedure takes approximately one hour. There are no restrictions for this procedure.  A chest x-ray takes a picture of the organs and structures inside the chest, including the heart, lungs, and blood vessels. This test can show several things, including, whether the heart is enlarges; whether fluid is building up in the lungs; and whether pacemaker / defibrillator leads are still in place.  Follow-Up: At [facility], you and your health needs are our priority.  As part of our continuing mission to provide you with exceptional heart care, we have created designated Provider Care Teams.  These Care Teams include your primary Cardiologist (physician) and Advanced Practice Providers (APPs -  Physician Assistants and Nurse Practitioners) who all work together to provide you with the care you need, when you need it. You will need a follow up appointment in 6 weeks. You may see Shirlee More, MD or another member of our [facility] Provider Team in [place]: Jenne Campus, MD . Jyl Heinz, MD . Berniece Salines, MD . Laurann Montana,  NP  Any Other Special Instructions Will Be Listed Below (If Applicable).  We have schedule you for an echocardiogram. This will help determine if your shortness of breath is due to your heart. This has been scheduled for you.   We have also ordered a chest xray. This will help [place] look at your lungs to look for the cause of shortness of breath.   Your fatigue could be form a number of factors. We are checking your blood counts today for anemia as this can cause fatigue and shortness of breath. Fatigue could be from your sleep apnea.   Pursed Lip Breathing Pursed lip breathing is a technique to relieve the feeling of being short of breath  Pursed lip breathing keeps your airways open longer when you exhale and empties more air from your lungs. This makes more space for fresh air when you inhale. Pursed lip breathing can also slow down your breathing and help your body not have to work so hard to breathe. Over time, pursed lip breathing may help you be able to be more physically active and do more activities.  How to perform pursed lip breathing Being short of breath can make you tense and anxious. Before you start this breathing exercise, take a minute to relax your shoulders and close your eyes. Then: 1. Start the exercise by closing your mouth. 2. Breathe in through your nose, taking a normal breath. You can do this at your normal rate of breathing. If you feel you are not getting enough air, breathe in while slowly counting  to 2 or 3. 3. Pucker (purse) your lips as if you were going to whistle. 4. Gently tighten your abdomen muscles or press on your belly to help push the air out. 5. Breathe out slowly through your pursed lips. Take at least twice as long to breathe out as it takes you to breathe in. 6. Make sure that you breathe out all of the air, but do not force air out. 7. Repeat the exercise until your breathing improves. Ask your health care provider how often and how long to do this  exercise. Follow these instructions at home:  Take over-the-counter and prescription medicines only as told by your health care provider.  Return to your normal activities as told by your health care provider. Ask your health care provider what activities are safe for you.  Do not use any products that contain nicotine or tobacco, such as cigarettes and e-cigarettes. If you need help quitting, ask your health care provider.  Keep all follow-up visits as told by your health care provider. This is important.  Contact a health care provider if:  Your shortness of breath gets worse.  You become less able to exercise or be active.  You develop a cough.  You develop a fever.  Get help right away if:  You are struggling to breathe.  Your shortness of breath prevents you from engaging in any activity.  Summary  Pursed lip breathing is a breathing technique that helps to remove trapped air from your lungs. It helps you get more oxygen into your lungs and makes your body have to work less hard to breathe.  Pursed lip breathing can gradually make you more able to be physically active.  You can do pursed lip breathing on your own at home.  Ask your health care provider how often and how long you should do pursed lip breathing. This information is not intended to replace advice given to you by your health care provider. Make sure you discuss any questions you have with your health care provider. Document Released: 01/22/2008 Document Revised: 03/27/2017 Document Reviewed: 03/06/2016 Elsevier Patient Education  2020 [employer].

## 2019-01-12 NOTE — Telephone Encounter (Signed)
Called to patient's home and reached his wife Ian Cummings.  Patient was not at home during the time of the call. Ian Cummings is not listed on DPR and was told to have the patient return call to make an appointment with Laurann Montana, NP for this week.  Ian Cummings stressed her concern for the shortness of breath that the patient has been having, but I explained to her I could not really discuss any information with her as she is not on DPR.    Ian Cummings was very understanding and patient will return call as soon as he returns home to scheduled with [facility]. No further questions.

## 2019-01-13 ENCOUNTER — Telehealth: Payer: Self-pay | Admitting: Family

## 2019-01-13 DIAGNOSIS — M5412 Radiculopathy, cervical region: Secondary | ICD-10-CM | POA: Insufficient documentation

## 2019-01-13 LAB — CBC
Hematocrit: 34.8 % — ABNORMAL LOW (ref 37.5–51.0)
Hemoglobin: 12 g/dL — ABNORMAL LOW (ref 13.0–17.7)
MCH: 32.6 pg (ref 26.6–33.0)
MCHC: 34.5 g/dL (ref 31.5–35.7)
MCV: 95 fL (ref 79–97)
Platelets: 148 10*3/uL — ABNORMAL LOW (ref 150–450)
RBC: 3.68 x10E6/uL — ABNORMAL LOW (ref 4.14–5.80)
RDW: 12.8 % (ref 11.6–15.4)
WBC: 6.2 10*3/uL (ref 3.4–10.8)

## 2019-01-13 LAB — BASIC METABOLIC PANEL
BUN/Creatinine Ratio: 16 (ref 10–24)
BUN: 21 mg/dL (ref 8–27)
CO2: 21 mmol/L (ref 20–29)
Calcium: 8.9 mg/dL (ref 8.6–10.2)
Chloride: 104 mmol/L (ref 96–106)
Creatinine, Ser: 1.33 mg/dL — ABNORMAL HIGH (ref 0.76–1.27)
GFR calc Af Amer: 55 mL/min/{1.73_m2} — ABNORMAL LOW (ref >59)
GFR calc non Af Amer: 48 mL/min/{1.73_m2} — ABNORMAL LOW (ref >59)
Glucose: 134 mg/dL — ABNORMAL HIGH (ref 65–99)
Potassium: 4.4 mmol/L (ref 3.5–5.2)
Sodium: 139 mmol/L (ref 134–144)

## 2019-01-13 MED ORDER — FUROSEMIDE 20 MG PO TABS: 20.0000 mg | ORAL_TABLET | Freq: Every day | ORAL | 0 refills | Status: DC

## 2019-01-13 NOTE — Telephone Encounter (Signed)
Spoke with Ian Cummings. Lab results and CXR reviewed in depth.   He verbalizes understanding of the plan to start Furosemide (Lasix) 20mg  (one tablet) in the morning. Confirmed pharmacy and 30-day supply sent. Education provided regarding this new medication and all questions answered.   Will continue with plan for echocardiogram. Will assess response to Lasix when I call him his echocardiogram results - anticipate this will be 01/17/19 or 01/18/19.   If echocardiogram is unrevealing may require referral to pulmonology.   Loel Dubonnet, NP

## 2019-01-13 NOTE — Addendum Note (Signed)
Addended by: Loel Dubonnet on: 01/13/2019 03:19 PM   Modules accepted: Orders

## 2019-01-13 NOTE — Telephone Encounter (Signed)
Called and left voicemail on home phone per DPR.   I have asked him to call be back regarding his CXR result. I would like to start him on Lasix 20mg  daily due to concern for small bilateral pleural effusions on CXR. I will wait to put this order in until speaking with him.   Labs overall good. BMP with normal electrolytes and stable kidney function. CBC with Hb 12 which is consistent with previous draw. Would recommend dietary sources of iron such as green leafy vegetables.   Loel Dubonnet, NP

## 2019-01-17 ENCOUNTER — Ambulatory Visit (HOSPITAL_BASED_OUTPATIENT_CLINIC_OR_DEPARTMENT_OTHER)
Admission: RE | Admit: 2019-01-17 | Discharge: 2019-01-17 | Disposition: A | Discharge status: Home / Self Care | Payer: Medicare HMO | Source: Ambulatory Visit | Attending: Family | Admitting: Family

## 2019-01-17 ENCOUNTER — Other Ambulatory Visit: Payer: Self-pay

## 2019-01-17 DIAGNOSIS — R0609 Other forms of dyspnea: Secondary | ICD-10-CM | POA: Diagnosis not present

## 2019-01-17 NOTE — Progress Notes (Signed)
  Echocardiogram 2D Echocardiogram has been performed.  Cardell Peach 01/17/2019, 3:52 PM

## 2019-01-18 ENCOUNTER — Telehealth: Payer: Self-pay | Admitting: Family

## 2019-01-18 NOTE — Telephone Encounter (Addendum)
Attempted to reach Mr. Peele to discuss echocardiogram results. Called home phone 01/18/19 at 9A, 10A, 12:30P with no answer and no voicemail set up. Called cell phone at 12:30P and left voicemail to request call back.   His echocardiogram 01/17/19 shows normal EF 55-60%, impaired diastolic relaxation, and moderate to  severe mitral regurgitation. This mitral regurgitation is a new finding and may be the cause of his shortness of breath. Dr. Bettina Gavia would like him added onto his schedule for an office visit next week (01/24/19-01/28/19) to discuss these results and next steps.   Would also like to know how he is responding to the Lasix we added on - has his SOB improved at all?  We will continue attempts to reach Mr. Carten.   Loel Dubonnet, NP

## 2019-01-19 NOTE — Telephone Encounter (Signed)
Patient reports that the lasix is working and his SOB is better at this time. He said to call if any more questions.

## 2019-01-27 DIAGNOSIS — M25519 Pain in unspecified shoulder: Secondary | ICD-10-CM | POA: Insufficient documentation

## 2019-01-27 DIAGNOSIS — M25512 Pain in left shoulder: Secondary | ICD-10-CM | POA: Diagnosis not present

## 2019-01-27 HISTORY — DX: Pain in unspecified shoulder: M25.519

## 2019-01-31 NOTE — Progress Notes (Signed)
Cardiology Office Note:    Date:  02/01/2019   ID:  Ian Cummings, DOB 07-Apr-1929, MRN [id number]  PCP:  Ian Redwood, MD  Cardiologist:  Ian More, MD  Electrophysiologist:  None   Referring MD: Ian Redwood, MD   This is a follow-up visit  History of Present Illness:    Ian Cummings is a 85 y.o. male with a hx of coronary artery disease status post CABG as well as PCI, hyperlipidemia, hypertension, peripheral artery disease hypothyroidism presents today for follow-up. The patient was last seen in our office by Ian Montana, NP on 01/12/2019. At which time reported worsening shortness of breath as well as fatigue.  At conclusion of the visit a chest x-ray, TTE and blood work was ordered.  He was also started on Lasix 20 mg daily at that time.  The patient was able to get the testing done in the interim.  Today, patient tells me that his shortness of breath has improved since he started on the Lasix and he is very pleased.  He offers no complaints today.  Past Medical History:  Diagnosis Date  . AAA (abdominal aortic aneurysm) ([place])    2004, Pt stated not there now  . Anginal pain ([place])   . Anxiety   . Arthritis   . BPH (benign prostatic hypertrophy)   . CAD (coronary artery disease)   . Carotid artery disease ([place])    Right carotid stent 2013  . Claudication ([place])   . Colon polyps    adenomatous  . Complex sleep apnea syndrome 11/11/2017  . Coronary artery disease   . Depression   . GERD (gastroesophageal reflux disease)   . Gout   . H/O hiatal hernia   . Hyperlipidemia   . Hypertension   . Hypothyroidism   . MI (myocardial infarction) ([place]) 4/14  . Neuropathy    Hx: of in B/L toes  . Peripheral vascular disease ([place])    with claudication  . Pre-diabetes   . Stroke ([place])   . TIA (transient ischemic attack)    Hx: of    Past Surgical History:  Procedure Laterality Date  . CAROTID STENT INSERTION Right 2013   right at [facility]  . CATARACT  EXTRACTION Bilateral    Hx: of both eyes  . COLONOSCOPY     Hx: of  . COLONOSCOPY    . CORONARY ANGIOPLASTY WITH STENT PLACEMENT  2010  . CORONARY ARTERY BYPASS GRAFT  1995  . CORONARY ARTERY BYPASS GRAFT N/A 10/18/2012   Procedure: REDO CORONARY ARTERY BYPASS GRAFTING (CABG);  Surgeon: Grace Isaac, MD;  Location: [place];  Service: Open Heart Surgery;  Laterality: N/A;  off pump times two using endoscopically harvested left saphenous vein  . CORONARY STENT PLACEMENT  06/2016  . ENDARTERECTOMY FEMORAL Right 08/03/2015   Procedure: RIGHT FEMORAL ENDARTERECTOMY WITH PATCH ANGIOPLASTY;  Surgeon: Serafina Mitchell, MD;  Location: [place];  Service: Vascular;  Laterality: Right;  . FEMORAL-POPLITEAL BYPASS GRAFT Right 08/03/2015   Procedure: RIGHT FEMORAL-BELOW KNEE POPLITEAL ARTERY BYPASS GRAFT;  Surgeon: Serafina Mitchell, MD;  Location: [place];  Service: Vascular;  Laterality: Right;  . FOOT SURGERY Left   . FRACTURE SURGERY Left    Hx: of Left heel  . INGUINAL HERNIA REPAIR Bilateral    X 2   . INTRAOPERATIVE TRANSESOPHAGEAL ECHOCARDIOGRAM N/A 10/18/2012   Procedure: INTRAOPERATIVE TRANSESOPHAGEAL ECHOCARDIOGRAM;  Surgeon: Grace Isaac, MD;  Location: [place];  Service: Open Heart Surgery;  Laterality: N/A;  . LEFT HEART CATHETERIZATION WITH CORONARY/GRAFT ANGIOGRAM N/A 02/02/2013   Procedure: LEFT HEART CATHETERIZATION WITH Beatrix Fetters;  Surgeon: Jacolyn Reedy, MD;  Location: [facility] CATH LAB;  Service: Cardiovascular;  Laterality: N/A;  . LOWER EXTREMITY ANGIOGRAM Right 06/27/2015   Procedure: Lower Extremity Angiogram;  Surgeon: Serafina Mitchell, MD;  Location: [place] CV LAB;  Service: Cardiovascular;  Laterality: Right;  . MASTOIDECTOMY  1933  . PERIPHERAL VASCULAR CATHETERIZATION N/A 01/23/2015   Procedure: Abdominal Aortogram;  Surgeon: Serafina Mitchell, MD;  Location: [place] CV LAB;  Service: Cardiovascular;  Laterality: N/A;  . PERIPHERAL VASCULAR CATHETERIZATION N/A  06/27/2015   Procedure: Abdominal Aortogram;  Surgeon: Serafina Mitchell, MD;  Location: [place] CV LAB;  Service: Cardiovascular;  Laterality: N/A;  . RETINAL DETACHMENT SURGERY Left    Hx: of left eye  . TONSILLECTOMY      Current Medications: Current Meds  Medication Sig  . allopurinol (ZYLOPRIM) 100 MG tablet Take 100 mg by mouth daily.   Marland Kitchen amLODipine (NORVASC) 5 MG tablet Take 5 mg by mouth daily.   Marland Kitchen aspirin EC 81 MG tablet Take 81 mg by mouth daily.   . B Complex Vitamins (B COMPLEX 1 PO) Take 1 tablet by mouth daily.  . clopidogrel (PLAVIX) 75 MG tablet Take 75 mg by mouth daily.  Marland Kitchen escitalopram (LEXAPRO) 10 MG tablet Take 10 mg by mouth daily.  . furosemide (LASIX) 20 MG tablet Take 1 tablet (20 mg total) by mouth daily.  . isosorbide mononitrate (IMDUR) 30 MG 24 hr tablet Take 30 mg by mouth daily.   Marland Kitchen levothyroxine (SYNTHROID, LEVOTHROID) 88 MCG tablet Take 88 mcg by mouth daily before breakfast.  . lisinopril (PRINIVIL,ZESTRIL) 20 MG tablet Take 20 mg by mouth daily.  . metoprolol succinate (TOPROL-XL) 50 MG 24 hr tablet Take 50 mg by mouth daily.  . multivitamin (RENA-VIT) TABS tablet Take 1 tablet by mouth daily.  Marland Kitchen omeprazole (PRILOSEC) 20 MG capsule Take 20 mg by mouth daily.  Marland Kitchen RANEXA 500 MG 12 hr tablet Take 500 mg by mouth 2 (two) times daily.   . rosuvastatin (CRESTOR) 40 MG tablet Take 40 mg by mouth daily.  . [DISCONTINUED] furosemide (LASIX) 20 MG tablet Take 1 tablet (20 mg total) by mouth daily.     Allergies:   Amoxicillin and Metoprolol   Social History   Socioeconomic History  . Marital status: Married    Spouse name: Not on file  . Number of children: 3  . Years of education: Not on file  . Highest education level: Not on file  Occupational History  . 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 Financial resource strain: Not on file  . Food insecurity    Worry: Not on file    Inability: Not on file  . Transportation needs    Medical: Not on file     Non-medical: Not on file  Tobacco Use  . Smoking status: Former Smoker    Types: Cigarettes    Quit date: 04/29/1975    Years since quitting: 43.7  . Smokeless tobacco: Never Used  Substance and Sexual Activity  . Alcohol use: Yes    Alcohol/week: 3.0 standard drinks    Types: 3 Glasses of wine per week    Comment: 3 glasses of wine today  . Drug use: No  . Sexual activity: Not Currently  Lifestyle  . Physical activity    Days per week: Not on file  Minutes per session: Not on file  . Stress: Not on file  Relationships  . Social Herbalist on phone: Not on file    Gets together: Not on file    Attends religious service: Not on file    Active member of club or organization: Not on file    Attends meetings of clubs or organizations: Not on file    Relationship status: Not on file  Other Topics Concern  . 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 There is no history of Colon cancer, Stomach cancer, Rectal cancer, Esophageal cancer, Liver disease, or Kidney disease.  ROS:   Review of Systems  Constitution: Negative for decreased appetite, fever and weight gain.  HENT: Negative for congestion, ear discharge, hoarse voice and sore throat.   Eyes: Negative for discharge, redness, vision loss in right eye and visual halos.  Cardiovascular: Negative for chest pain, dyspnea on exertion, leg swelling, orthopnea and palpitations.  Respiratory: Negative for cough, hemoptysis, shortness of breath and snoring.   Endocrine: Negative for heat intolerance and polyphagia.  Hematologic/Lymphatic: Negative for bleeding problem. Does not bruise/bleed easily.  Skin: Negative for flushing, nail changes, rash and suspicious lesions.   Musculoskeletal: Negative for arthritis, joint pain, muscle cramps, myalgias, neck pain and stiffness.  Gastrointestinal: Negative for abdominal pain, bowel incontinence, diarrhea and excessive appetite.  Genitourinary: Negative for decreased libido, genital sores and incomplete emptying.  Neurological: Negative for brief paralysis, focal weakness, headaches and loss of balance.  Psychiatric/Behavioral: Negative for altered mental status, depression and suicidal ideas.  Allergic/Immunologic: Negative for HIV exposure and persistent infections.    EKGs/Labs/Other Studies Reviewed:    The following studies were reviewed today:  EKG: None today.  TTE performed on 01/17/2019  IMPRESSIONS  1. Left ventricular ejection fraction, by visual estimation, is 55 to 60%. The left ventricle has normal function. Normal left ventricular size. There is borderline left ventricular hypertrophy.  2. Left ventricular diastolic Doppler parameters are consistent with impaired relaxation pattern of LV diastolic filling.  3. Global right ventricle has normal systolic function.The right ventricular size is normal. No increase in right ventricular wall thickness.  4. Left atrial size was moderately dilated.   5. Right atrial size was normal.  6. The mitral valve is normal in structure. Moderate to severe mitral valve regurgitation. No evidence of mitral stenosis.  7. The tricuspid valve is normal in structure. Tricuspid valve regurgitation was not visualized by color flow Doppler.  8. The aortic valve is normal in structure. Aortic valve regurgitation was not visualized by color flow Doppler. Structurally normal aortic valve, with no evidence of sclerosis or stenosis.  9. The pulmonic valve was normal in structure. Pulmonic valve regurgitation is not visualized by color flow Doppler. 10. Mildly elevated pulmonary artery systolic pressure. 11. The inferior vena cava is normal in size with greater than 50% respiratory  variability, suggesting right atrial pressure of 3 mmHg.  Chest X-ray  IMPRESSION: 01/12/2019 1. Interval enlargement of the heart since 2016. This may be consistent with underlying cardiomyopathy. 2. Mild interstitial and pulmonary vascular prominence which appears fairly stable but may be consistent with some chronic interstitial edema. There is a suggestion of small bilateral pleural effusions, left  greater than right.   Recent Labs: 01/12/2019: BUN 21; Creatinine, Ser 1.33; Hemoglobin 12.0; Platelets 148; Potassium 4.4; Sodium 139  Recent Lipid Panel    Component Value Date/Time   CHOL  04/18/2009 0241    109        ATP III CLASSIFICATION:  <200     mg/dL   Desirable  200-239  mg/dL   Borderline High  >=240    mg/dL   High          TRIG 114 04/18/2009 0241   HDL 34 (L) 04/18/2009 0241   CHOLHDL 3.2 04/18/2009 0241   VLDL 23 04/18/2009 0241   LDLCALC  04/18/2009 0241    52        Total Cholesterol/HDL:CHD Risk Coronary Heart Disease Risk Table                     Men   Women  1/2 Average Risk   3.4   3.3  Average Risk       5.0   4.4  2 X Average Risk   9.6   7.1  3 X Average Risk  23.4   11.0        Use the calculated Patient Ratio above and the CHD Risk Table to determine the patient's CHD Risk.        ATP III CLASSIFICATION (LDL):  <100     mg/dL   Optimal  100-129  mg/dL   Near or Above                    Optimal  130-159  mg/dL   Borderline  160-189  mg/dL   High  >190     mg/dL   Very High    Physical Exam:    VS:  BP 140/60   Pulse (!) 56   Resp 14   Ht 5\' 10"  (1.778 m)   Wt 158 lb 6.4 oz (71.8 kg)   BMI 22.73 kg/m     Wt Readings from Last 3 Encounters:  02/01/19 158 lb 6.4 oz (71.8 kg)  01/12/19 166 lb 6.4 oz (75.5 kg)  10/04/18 170 lb (77.1 kg)     GEN: Well nourished, well developed in no acute distress HEENT: Normal NECK: No JVD; No carotid bruits LYMPHATICS: No lymphadenopathy CARDIAC: S1S2 noted,RRR, no murmurs, rubs, gallops  RESPIRATORY:  Clear to auscultation without rales, wheezing or rhonchi  ABDOMEN: Soft, non-tender, non-distended, +bowel sounds, no guarding. EXTREMITIES: No edema, No cyanosis, no clubbing MUSCULOSKELETAL:  No edema; No deformity  SKIN: Warm and dry NEUROLOGIC:  Alert and oriented x 3, non-focal PSYCHIATRIC:  Normal affect, good insight  ASSESSMENT:    1. Moderate to severe mitral regurgitation   2. Essential hypertension   3. Shortness of breath    PLAN:     1.Mr. Rodena Piety symptoms seem to have improved since starting Lasix 20 mg daily.  For now we will continue this.  He will get blood work today to monitor his electrolytes.  2.  His recent echo showed evidence of moderate to severe mitral regurgitation.  I have advised patient about this.  We will continue medical therapy for now and monitor.  Repeat echocardiogram 6 to 12 months or if symptoms of shortness of breath, fatigue recurs.  The patient was curious about potential treatment options, therefore I did explain to him that medical treatment mostly with cautious diuretics, or intervention (which could be surgical or percutaneous).  For now  is agreeable to the current plan.  All the patient questions answered to his satisfaction.  The patient is in agreement with the above plan. The patient left the office in stable condition.  The patient will follow up in 2 months with Dr. Bettina Gavia   Medication Adjustments/Labs and Tests Ordered: Current medicines are reviewed at length with the patient today.  Concerns regarding medicines are outlined above.  Orders Placed This Encounter  Procedures  . Basic Metabolic Panel (BMET)  . Magnesium   Meds ordered this encounter  Medications  . furosemide (LASIX) 20 MG tablet    Sig: Take 1 tablet (20 mg total) by mouth daily.    Dispense:  90 tablet    Refill:  1    Order Specific Question:   Supervising Provider    Answer:   Richardo Priest [id number]    Patient Instructions  Medication  Instructions:  No medication changes.   If you need a refill on your cardiac medications before your next appointment, please call your pharmacy.   Lab work: Your physician recommends that you return for lab work today: BMP and Magnesium.  If you have labs (blood work) drawn today and your tests are completely normal, you will receive your results only by: Marland Kitchen MyChart Message (if you have MyChart) OR . A paper copy in the mail If you have any lab test that is abnormal or we need to change your treatment, we will call you to review the results.  Testing/Procedures: None  Follow-Up: At [facility], you and your health needs are our priority.  As part of our continuing mission to provide you with exceptional heart care, we have created designated Provider Care Teams.  These Care Teams include your primary Cardiologist (physician) and Advanced Practice Providers (APPs -  Physician Assistants and Nurse Practitioners) who all work together to provide you with the care you need, when you need it. You will need a follow up appointment in 2 months.  You may see Ian More, MD or another member of our [place] Provider Team in [place]: Jenne Campus, MD . Jyl Heinz, MD  Any Other Special Instructions Will Be Listed Below (If Applicable).  Continue to check your blood pressure.      Adopting a Healthy Lifestyle.  Know what a healthy weight is for you (roughly BMI <25) and aim to maintain this   Aim for 7+ servings of fruits and vegetables daily   65-80+ fluid ounces of water or unsweet tea for healthy kidneys   Limit to max 1 drink of alcohol per day; avoid smoking/tobacco   Limit animal fats in diet for cholesterol and heart health - choose grass fed whenever available   Avoid highly processed foods, and foods high in saturated/trans fats   Aim for low stress - take time to unwind and care for your mental health   Aim for 150 min of moderate intensity exercise  weekly for heart health, and weights twice weekly for bone health   Aim for 7-9 hours of sleep daily   When it comes to diets, agreement about the perfect plan isnt easy to find, even among the experts. Experts at the [employer] developed an idea known as the Healthy Eating Plate. Just imagine a plate divided into logical, healthy portions.   The emphasis is on diet quality:   Load up on vegetables and fruits - one-half of your plate: Aim for color and variety, and remember that  potatoes dont count.   Go for whole grains - one-quarter of your plate: Whole wheat, barley, wheat berries, quinoa, oats, brown rice, and foods made with them. If you want pasta, go with whole wheat pasta.   Protein power - one-quarter of your plate: Fish, chicken, beans, and nuts are all healthy, versatile protein sources. Limit red meat.   The diet, however, does go beyond the plate, offering a few other suggestions.   Use healthy plant oils, such as olive, canola, soy, corn, sunflower and peanut. Check the labels, and avoid partially hydrogenated oil, which have unhealthy trans fats.   If youre thirsty, drink water. Coffee and tea are good in moderation, but skip sugary drinks and limit milk and dairy products to one or two daily servings.   The type of carbohydrate in the diet is Cummings important than the amount. Some sources of carbohydrates, such as vegetables, fruits, whole grains, and beans-are healthier than others.   Finally, stay active  Signed, Berniece Salines, DO  02/01/2019 11:50 AM    [place]

## 2019-02-01 ENCOUNTER — Other Ambulatory Visit: Payer: Self-pay

## 2019-02-01 ENCOUNTER — Encounter: Payer: Self-pay | Admitting: Cardiology

## 2019-02-01 ENCOUNTER — Ambulatory Visit (INDEPENDENT_AMBULATORY_CARE_PROVIDER_SITE_OTHER): Payer: Medicare HMO | Admitting: Cardiology

## 2019-02-01 VITALS — BP 140/60 | HR 56 | Resp 14 | Ht 70.0 in | Wt 158.4 lb

## 2019-02-01 DIAGNOSIS — I340 Nonrheumatic mitral (valve) insufficiency: Secondary | ICD-10-CM | POA: Diagnosis not present

## 2019-02-01 DIAGNOSIS — R0602 Shortness of breath: Secondary | ICD-10-CM

## 2019-02-01 DIAGNOSIS — I10 Essential (primary) hypertension: Secondary | ICD-10-CM

## 2019-02-01 MED ORDER — FUROSEMIDE 20 MG PO TABS: 20.0000 mg | ORAL_TABLET | Freq: Every day | ORAL | 1 refills | Status: DC

## 2019-02-01 NOTE — Patient Instructions (Signed)
Medication Instructions:  No medication changes.   If you need a refill on your cardiac medications before your next appointment, please call your pharmacy.   Lab work: Your physician recommends that you return for lab work today: BMP and Magnesium.  If you have labs (blood work) drawn today and your tests are completely normal, you will receive your results only by: Marland Kitchen MyChart Message (if you have MyChart) OR . A paper copy in the mail If you have any lab test that is abnormal or we need to change your treatment, we will call you to review the results.  Testing/Procedures: None  Follow-Up: At [facility], you and your health needs are our priority.  As part of our continuing mission to provide you with exceptional heart care, we have created designated Provider Care Teams.  These Care Teams include your primary Cardiologist (physician) and Advanced Practice Providers (APPs -  Physician Assistants and Nurse Practitioners) who all work together to provide you with the care you need, when you need it. You will need a follow up appointment in 2 months.  You may see Shirlee More, MD or another member of our [place] Provider Team in [place]: Jenne Campus, MD . Jyl Heinz, MD  Any Other Special Instructions Will Be Listed Below (If Applicable).  Continue to check your blood pressure.

## 2019-02-02 LAB — BASIC METABOLIC PANEL
BUN/Creatinine Ratio: 24 (ref 10–24)
BUN: 31 mg/dL — ABNORMAL HIGH (ref 8–27)
CO2: 23 mmol/L (ref 20–29)
Calcium: 10.1 mg/dL (ref 8.6–10.2)
Chloride: 100 mmol/L (ref 96–106)
Creatinine, Ser: 1.3 mg/dL — ABNORMAL HIGH (ref 0.76–1.27)
GFR calc Af Amer: 57 mL/min/{1.73_m2} — ABNORMAL LOW (ref >59)
GFR calc non Af Amer: 49 mL/min/{1.73_m2} — ABNORMAL LOW (ref >59)
Glucose: 81 mg/dL (ref 65–99)
Potassium: 5.1 mmol/L (ref 3.5–5.2)
Sodium: 137 mmol/L (ref 134–144)

## 2019-02-02 LAB — MAGNESIUM: Magnesium: 2.2 mg/dL (ref 1.6–2.3)

## 2019-02-04 ENCOUNTER — Other Ambulatory Visit: Payer: Self-pay | Admitting: Family

## 2019-02-07 ENCOUNTER — Telehealth: Payer: Self-pay | Admitting: Family

## 2019-02-07 NOTE — Addendum Note (Signed)
Addended by: Ashok Norris on: 02/07/2019 03:05 PM   Modules accepted: Orders

## 2019-02-07 NOTE — Telephone Encounter (Signed)
Called patient he already got his medicine. No further needs.

## 2019-02-07 NOTE — Telephone Encounter (Signed)
Please call in Furosemide to the [facility] on [address] and he is out.Marland Kitchen Please call.. Patient needs and out.Marland Kitchen

## 2019-02-23 ENCOUNTER — Ambulatory Visit: Payer: Medicare HMO | Admitting: Neurology

## 2019-02-24 ENCOUNTER — Ambulatory Visit: Payer: Medicare HMO | Admitting: Cardiology

## 2019-03-03 ENCOUNTER — Telehealth: Payer: Self-pay | Admitting: Orthopaedic Surgery

## 2019-03-03 DIAGNOSIS — M25519 Pain in unspecified shoulder: Secondary | ICD-10-CM | POA: Diagnosis not present

## 2019-03-03 DIAGNOSIS — L812 Freckles: Secondary | ICD-10-CM | POA: Diagnosis not present

## 2019-03-03 DIAGNOSIS — D1801 Hemangioma of skin and subcutaneous tissue: Secondary | ICD-10-CM | POA: Diagnosis not present

## 2019-03-03 DIAGNOSIS — L570 Actinic keratosis: Secondary | ICD-10-CM | POA: Diagnosis not present

## 2019-03-03 DIAGNOSIS — M542 Cervicalgia: Secondary | ICD-10-CM | POA: Diagnosis not present

## 2019-03-03 DIAGNOSIS — L821 Other seborrheic keratosis: Secondary | ICD-10-CM | POA: Diagnosis not present

## 2019-03-03 DIAGNOSIS — I10 Essential (primary) hypertension: Secondary | ICD-10-CM | POA: Diagnosis not present

## 2019-03-03 DIAGNOSIS — M25512 Pain in left shoulder: Secondary | ICD-10-CM | POA: Diagnosis not present

## 2019-03-03 NOTE — Telephone Encounter (Signed)
Records faxed to [facility] [phone]

## 2019-03-23 DIAGNOSIS — E038 Other specified hypothyroidism: Secondary | ICD-10-CM | POA: Diagnosis not present

## 2019-03-23 DIAGNOSIS — R7301 Impaired fasting glucose: Secondary | ICD-10-CM | POA: Diagnosis not present

## 2019-03-23 DIAGNOSIS — E7849 Other hyperlipidemia: Secondary | ICD-10-CM | POA: Diagnosis not present

## 2019-03-25 DIAGNOSIS — R82998 Other abnormal findings in urine: Secondary | ICD-10-CM | POA: Diagnosis not present

## 2019-03-30 DIAGNOSIS — D6869 Other thrombophilia: Secondary | ICD-10-CM | POA: Diagnosis not present

## 2019-03-30 DIAGNOSIS — F0280 Dementia in other diseases classified elsewhere without behavioral disturbance: Secondary | ICD-10-CM | POA: Diagnosis not present

## 2019-03-30 DIAGNOSIS — Z86711 Personal history of pulmonary embolism: Secondary | ICD-10-CM | POA: Diagnosis not present

## 2019-03-30 DIAGNOSIS — N1831 Chronic kidney disease, stage 3a: Secondary | ICD-10-CM | POA: Diagnosis not present

## 2019-03-30 DIAGNOSIS — E785 Hyperlipidemia, unspecified: Secondary | ICD-10-CM | POA: Diagnosis not present

## 2019-03-30 DIAGNOSIS — I10 Essential (primary) hypertension: Secondary | ICD-10-CM | POA: Diagnosis not present

## 2019-03-30 DIAGNOSIS — E669 Obesity, unspecified: Secondary | ICD-10-CM | POA: Diagnosis not present

## 2019-03-30 DIAGNOSIS — I739 Peripheral vascular disease, unspecified: Secondary | ICD-10-CM | POA: Diagnosis not present

## 2019-03-30 DIAGNOSIS — R7301 Impaired fasting glucose: Secondary | ICD-10-CM | POA: Diagnosis not present

## 2019-03-30 DIAGNOSIS — Z0000 Encounter for general adult medical examination without abnormal findings: Secondary | ICD-10-CM | POA: Diagnosis not present

## 2019-03-30 DIAGNOSIS — I209 Angina pectoris, unspecified: Secondary | ICD-10-CM | POA: Diagnosis not present

## 2019-03-30 DIAGNOSIS — C61 Malignant neoplasm of prostate: Secondary | ICD-10-CM | POA: Diagnosis not present

## 2019-03-31 DIAGNOSIS — M7061 Trochanteric bursitis, right hip: Secondary | ICD-10-CM | POA: Diagnosis not present

## 2019-03-31 DIAGNOSIS — M542 Cervicalgia: Secondary | ICD-10-CM | POA: Diagnosis not present

## 2019-03-31 DIAGNOSIS — M7062 Trochanteric bursitis, left hip: Secondary | ICD-10-CM | POA: Insufficient documentation

## 2019-03-31 DIAGNOSIS — M25512 Pain in left shoulder: Secondary | ICD-10-CM | POA: Diagnosis not present

## 2019-03-31 DIAGNOSIS — I10 Essential (primary) hypertension: Secondary | ICD-10-CM | POA: Diagnosis not present

## 2019-04-08 ENCOUNTER — Telehealth (HOSPITAL_COMMUNITY): Payer: Self-pay

## 2019-04-08 ENCOUNTER — Other Ambulatory Visit: Payer: Self-pay

## 2019-04-08 DIAGNOSIS — I6523 Occlusion and stenosis of bilateral carotid arteries: Secondary | ICD-10-CM

## 2019-04-08 DIAGNOSIS — I7025 Atherosclerosis of native arteries of other extremities with ulceration: Secondary | ICD-10-CM

## 2019-04-08 NOTE — Telephone Encounter (Signed)

## 2019-04-11 ENCOUNTER — Ambulatory Visit: Payer: Medicare HMO | Admitting: Physician Assistant

## 2019-04-11 ENCOUNTER — Ambulatory Visit (INDEPENDENT_AMBULATORY_CARE_PROVIDER_SITE_OTHER)
Admission: RE | Admit: 2019-04-11 | Discharge: 2019-04-11 | Disposition: A | Discharge status: Home / Self Care | Payer: Medicare HMO | Source: Ambulatory Visit | Attending: Family | Admitting: Family

## 2019-04-11 ENCOUNTER — Other Ambulatory Visit: Payer: Self-pay

## 2019-04-11 ENCOUNTER — Ambulatory Visit (HOSPITAL_COMMUNITY)
Admission: RE | Admit: 2019-04-11 | Discharge: 2019-04-11 | Disposition: A | Discharge status: Home / Self Care | Payer: Medicare HMO | Source: Ambulatory Visit | Attending: Family | Admitting: Family

## 2019-04-11 DIAGNOSIS — I6523 Occlusion and stenosis of bilateral carotid arteries: Secondary | ICD-10-CM

## 2019-04-11 DIAGNOSIS — I7025 Atherosclerosis of native arteries of other extremities with ulceration: Secondary | ICD-10-CM | POA: Diagnosis not present

## 2019-04-11 NOTE — Progress Notes (Signed)
Cardiology Office Note:    Date:  04/12/2019   ID:  Ian Cummings, DOB 04-Jan-1929, MRN [id number]  PCP:  Ian Redwood, MD  Cardiologist:  Ian More, MD    Referring MD: Ian Redwood, MD    ASSESSMENT:    1. Nonrheumatic mitral valve regurgitation   2. Coronary artery disease of native artery of native heart with stable angina pectoris (Ian Cummings)   3. Hypertensive heart disease with chronic diastolic congestive heart failure (Ian Cummings)   4. Mixed hyperlipidemia    PLAN:    In order of problems listed above:  1. Stable both by physical examination I personally reviewed the echocardiogram he does not have severe mitral regurgitation his hypertensive heart disease of heart failure is compensated on a low-dose of a diuretic and I feel comfortable seen back in the office in 6 months.  I offered to check his labs again today for renal function he declined.  We will do a proBNP level with his next labs. 2. Stable CAD after revascularization he is having no angina on current medical treatment he will continue his long-term dual antiplatelet at the calcium channel blocker beta-blocker nitrate and ranolazine. 3. BP at target continue current treatment including ACE inhibitor 4. Lipids are ideal continue his current high intensity statin   Next appointment: 6 months   Medication Adjustments/Labs and Tests Ordered: Current medicines are reviewed at length with the patient today.  Concerns regarding medicines are outlined above.  No orders of the defined types were placed in this encounter.  No orders of the defined types were placed in this encounter.   Chief Complaint  Patient presents with  . Follow-up  . Mitral Regurgitation    History of Present Illness:    Ian Cummings is a 85 y.o. male with a hx of coronary artery disease status post CABG as well as PCI, hyperlipidemia, hypertension, peripheral artery disease hypothyroidism last seen 10/06/202o with Dr Harriet Masson for MR and SOB.  I  independently reviewed his echocardiogram he has hypokinesia of the basilar inferior wall and septum has mitral regurgitation which is moderate it is central very narrow vena contracta and unfortunately pulmonary vein was not evaluated.  I do not see criteria for severe mitral regurgitation.  Aortic valve is mildly thickened and restricted and when I reviewed the study there is a minimal gradient with peak and mean gradients are in the range of 20 and 9 mmHg. Compliance with diet, lifestyle and medications: yes  He is pleased with the quality of his life he is having no edema shortness of breath orthopnea chest pain palpitation or syncope tolerates his statin without muscle pain or weakness and tolerates dual antiplatelet therapy without GI upset bleeding or bruising.  TTE performed on 01/17/2019  IMPRESSIONS 1. Left ventricular ejection fraction, by visual estimation, is 55 to 60%. The left ventricle has normal function. Normal left ventricular size. There is borderline left ventricular hypertrophy. 2. Left ventricular diastolic Doppler parameters are consistent with impaired relaxation pattern of LV diastolic filling. 3. Global right ventricle has normal systolic function.The right ventricular size is normal. No increase in right ventricular wall thickness. 4. Left atrial size was moderately dilated.  5. Right atrial size was normal. 6. The mitral valve is normal in structure. Moderate to severe mitral valve regurgitation. No evidence of mitral stenosis. 7. The tricuspid valve is normal in structure. Tricuspid valve regurgitation was not visualized by color flow Doppler. 8. The aortic valve is normal in structure. Aortic valve  regurgitation was not visualized by color flow Doppler. Structurally normal aortic valve, with no evidence of sclerosis or stenosis. 9. The pulmonic valve was normal in structure. Pulmonic valve regurgitation is not visualized by color flow Doppler. 10. Mildly  elevated pulmonary artery systolic pressure. 11. The inferior vena cava is normal in size with greater than 50% respiratory variability, suggesting right atrial pressure of 3 mmHg.  Chest X-ray  IMPRESSION: 01/12/2019 1. Interval enlargement of the heart since 2016. This may be consistent with underlying cardiomyopathy. 2. Mild interstitial and pulmonary vascular prominence which appears fairly stable but may be consistent with some chronic interstitial edema. There is a suggestion of small bilateral pleural effusions, left greater than right. Past Medical History:  Diagnosis Date  . AAA (abdominal aortic aneurysm) ([place])    2004, Pt stated not there now  . Anginal pain ([place])   . Anxiety   . Arthritis   . BPH (benign prostatic hypertrophy)   . CAD (coronary artery disease)   . Carotid artery disease ([place])    Right carotid stent 2013  . Claudication ([place])   . Colon polyps    adenomatous  . Complex sleep apnea syndrome 11/11/2017  . Coronary artery disease   . Depression   . GERD (gastroesophageal reflux disease)   . Gout   . H/O hiatal hernia   . Hyperlipidemia   . Hypertension   . Hypothyroidism   . MI (myocardial infarction) ([place]) 4/14  . Neuropathy    Hx: of in B/L toes  . Peripheral vascular disease ([place])    with claudication  . Pre-diabetes   . Stroke ([place])   . TIA (transient ischemic attack)    Hx: of    Past Surgical History:  Procedure Laterality Date  . CAROTID STENT INSERTION Right 2013   right at [facility]  . CATARACT EXTRACTION Bilateral    Hx: of both eyes  . COLONOSCOPY     Hx: of  . COLONOSCOPY    . CORONARY ANGIOPLASTY WITH STENT PLACEMENT  2010  . CORONARY ARTERY BYPASS GRAFT  1995  . CORONARY ARTERY BYPASS GRAFT N/A 10/18/2012   Procedure: REDO CORONARY ARTERY BYPASS GRAFTING (CABG);  Surgeon: Grace Isaac, MD;  Location: [place];  Service: Open Heart Surgery;  Laterality: N/A;  off pump times two using endoscopically harvested left  saphenous vein  . CORONARY STENT PLACEMENT  06/2016  . ENDARTERECTOMY FEMORAL Right 08/03/2015   Procedure: RIGHT FEMORAL ENDARTERECTOMY WITH PATCH ANGIOPLASTY;  Surgeon: Serafina Mitchell, MD;  Location: [place];  Service: Vascular;  Laterality: Right;  . FEMORAL-POPLITEAL BYPASS GRAFT Right 08/03/2015   Procedure: RIGHT FEMORAL-BELOW KNEE POPLITEAL ARTERY BYPASS GRAFT;  Surgeon: Serafina Mitchell, MD;  Location: [place];  Service: Vascular;  Laterality: Right;  . FOOT SURGERY Left   . FRACTURE SURGERY Left    Hx: of Left heel  . INGUINAL HERNIA REPAIR Bilateral    X 2   . INTRAOPERATIVE TRANSESOPHAGEAL ECHOCARDIOGRAM N/A 10/18/2012   Procedure: INTRAOPERATIVE TRANSESOPHAGEAL ECHOCARDIOGRAM;  Surgeon: Grace Isaac, MD;  Location: [place];  Service: Open Heart Surgery;  Laterality: N/A;  . LEFT HEART CATHETERIZATION WITH CORONARY/GRAFT ANGIOGRAM N/A 02/02/2013   Procedure: LEFT HEART CATHETERIZATION WITH Beatrix Fetters;  Surgeon: Jacolyn Reedy, MD;  Location: [facility] CATH LAB;  Service: Cardiovascular;  Laterality: N/A;  . LOWER EXTREMITY ANGIOGRAM Right 06/27/2015   Procedure: Lower Extremity Angiogram;  Surgeon: Serafina Mitchell, MD;  Location: [place] CV LAB;  Service:  Cardiovascular;  Laterality: Right;  . MASTOIDECTOMY  1933  . PERIPHERAL VASCULAR CATHETERIZATION N/A 01/23/2015   Procedure: Abdominal Aortogram;  Surgeon: Serafina Mitchell, MD;  Location: [place] CV LAB;  Service: Cardiovascular;  Laterality: N/A;  . PERIPHERAL VASCULAR CATHETERIZATION N/A 06/27/2015   Procedure: Abdominal Aortogram;  Surgeon: Serafina Mitchell, MD;  Location: [place] CV LAB;  Service: Cardiovascular;  Laterality: N/A;  . RETINAL DETACHMENT SURGERY Left    Hx: of left eye  . TONSILLECTOMY      Current Medications: Current Meds  Medication Sig  . ACCU-CHEK AVIVA PLUS test strip   . allopurinol (ZYLOPRIM) 100 MG tablet Take 100 mg by mouth daily.   Marland Kitchen amLODipine (NORVASC) 5 MG tablet Take 5 mg by  mouth daily.   Marland Kitchen aspirin EC 81 MG tablet Take 81 mg by mouth daily.   . B Complex Vitamins (B COMPLEX 1 PO) Take 1 tablet by mouth daily.  . Blood Glucose Monitoring Suppl (ACCU-CHEK AVIVA PLUS) w/Device KIT   . clopidogrel (PLAVIX) 75 MG tablet Take 75 mg by mouth daily.  Marland Kitchen escitalopram (LEXAPRO) 10 MG tablet Take 10 mg by mouth daily.  . furosemide (LASIX) 20 MG tablet Take 1 tablet (20 mg total) by mouth daily.  . furosemide (LASIX) 20 MG tablet TAKE 1 TABLET BY MOUTH EVERY DAY  . isosorbide mononitrate (IMDUR) 30 MG 24 hr tablet Take 30 mg by mouth daily.   Marland Kitchen levothyroxine (SYNTHROID, LEVOTHROID) 88 MCG tablet Take 88 mcg by mouth daily before breakfast.  . lisinopril (ZESTRIL) 40 MG tablet 40 mg daily.  . metoprolol succinate (TOPROL-XL) 50 MG 24 hr tablet Take 50 mg by mouth daily.  . multivitamin (RENA-VIT) TABS tablet Take 1 tablet by mouth daily.  Marland Kitchen omeprazole (PRILOSEC) 20 MG capsule Take 20 mg by mouth daily.  Marland Kitchen RANEXA 500 MG 12 hr tablet Take 500 mg by mouth 2 (two) times daily.   . rosuvastatin (CRESTOR) 40 MG tablet Take 40 mg by mouth daily.  . [DISCONTINUED] lisinopril (PRINIVIL,ZESTRIL) 20 MG tablet Take 20 mg by mouth daily.     Allergies:   Amoxicillin and Metoprolol   Social History   Socioeconomic History  . Marital status: Married    Spouse name: Not on file  . Number of children: 3  . Years of education: Not on file  . Highest education level: Not on file  Occupational History  . Occupation: retired  Tobacco Use  . Smoking status: Former Smoker    Types: Cigarettes    Quit date: 04/29/1975    Years since quitting: 43.9  . Smokeless tobacco: Never Used  Substance and Sexual Activity  . Alcohol use: Yes    Alcohol/week: 3.0 standard drinks    Types: 3 Glasses of wine per week    Comment: 3 glasses of wine today  . Drug use: No  . Sexual activity: Not Currently  Other Topics Concern  . 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 Difficulty of Paying Living Expenses: Not on file  Food Insecurity:   . Worried About Charity fundraiser in the Last Year: Not on file  . Ran Out of Food in the Last Year: Not on file  Transportation Needs:   . Lack of Transportation (Medical): Not on file  . Lack of Transportation (Non-Medical): Not on file  Physical Activity:   .  Days of Exercise per Week: Not on file  . Minutes of Exercise per Session: Not on file  Stress:   . Feeling of Stress : Not on file  Social Connections:   . Frequency of Communication with Friends and Family: Not on file  . Frequency of Social Gatherings with Friends and Family: Not on file  . Attends Religious Services: Not on file  . Active Member of Clubs or Organizations: Not on file  . Attends Archivist Meetings: Not on file  . Marital Status: Not on file     Family History: The patient's family history includes Diabetes in his father; Heart attack in his mother; Heart disease in his father and mother; Hyperlipidemia in his father; Hypertension in his father; Stroke in his sister. There is no history of Colon cancer, Stomach cancer, Rectal cancer, Esophageal cancer, Liver disease, or Kidney disease. ROS:   Please see the history of present illness.    All other systems reviewed and are negative.  EKGs/Labs/Other Studies Reviewed:    The following studies were reviewed today:  Recent Labs: 01/12/2019: Hemoglobin 12.0; Platelets 148 02/01/2019: BUN 31; Creatinine, Ser 1.30; Magnesium 2.2; Potassium 5.1; Sodium 137  Recent Lipid Panel    Component Value Date/Time   CHOL  04/18/2009 0241    109        ATP III CLASSIFICATION:  <200     mg/dL   Desirable  200-239  mg/dL   Borderline High  >=240    mg/dL   High          TRIG 114 04/18/2009 0241   HDL 34 (L) 04/18/2009 0241   CHOLHDL 3.2 04/18/2009 0241   VLDL 23 04/18/2009 0241   LDLCALC  04/18/2009 0241     52        Total Cholesterol/HDL:CHD Risk Coronary Heart Disease Risk Table                     Men   Women  1/2 Average Risk   3.4   3.3  Average Risk       5.0   4.4  2 X Average Risk   9.6   7.1  3 X Average Risk  23.4   11.0        Use the calculated Patient Ratio above and the CHD Risk Table to determine the patient's CHD Risk.        ATP III CLASSIFICATION (LDL):  <100     mg/dL   Optimal  100-129  mg/dL   Near or Above                    Optimal  130-159  mg/dL   Borderline  160-189  mg/dL   High  >190     mg/dL   Very High    Physical Exam:    VS:  BP 120/64 (BP Location: Right Arm, Patient Position: Sitting, Cuff Size: Normal)   Pulse 62   Ht 5' 10.5" (1.791 m)   Wt 157 lb (71.2 kg)   SpO2 94%   BMI 22.21 kg/m     Wt Readings from Last 3 Encounters:  04/12/19 157 lb (71.2 kg)  04/11/19 160 lb (72.6 kg)  02/01/19 158 lb 6.4 oz (71.8 kg)     GEN:  Well nourished, well developed in no acute distress HEENT: Normal NECK: No JVD; No carotid bruits LYMPHATICS: No lymphadenopathy CARDIAC: Grade 1/6 apical MR very unimpressive murmur no  S3 S2 is normal RRR, no rubs, gallops RESPIRATORY:  Clear to auscultation without rales, wheezing or rhonchi  ABDOMEN: Soft, non-tender, non-distended MUSCULOSKELETAL:  No edema; No deformity  SKIN: Warm and dry NEUROLOGIC:  Alert and oriented x 3 PSYCHIATRIC:  Normal affect    Signed, Ian More, MD  04/12/2019 11:54 AM    [place]

## 2019-04-11 NOTE — Progress Notes (Signed)
  HISTORY AND PHYSICAL     CC:  follow up. Requesting Provider:  Marton Redwood, MD  HPI: This is a 85 y.o. male who is here today for follow up.  He underwent right carotid stent placement at [facility] in 2013 by Dr. Harvel Ricks.  The also is s/p right EIA, CFA and profunda femoral artery endarterectomy with bovine pericardial patch angioplasty, followed by a right common femoral to below knee popliteal artery bypass graft with 6 mm [place] 08-03-15 by Dr. Brunilda Payor a right heel ulcer and a right great toe ulcer, both of which have healed.   He has a history of CABG in 1996 x 4-5 vessels, off pump CABG x 26 September 2012 by Dr. Servando Snare. He subsequently had a stent placed at the end of March 2018 in [place], [facility] while he was on vacation there.  The pt returns today for follow up, but left without being seen.    I tried to call the pt's cell phone but I did not get an answer.  I called him at home and his wife answered but he was not there.  We will schedule him for a virtual visit to go over his studies from today.  Leontine Locket, [facility] 04/11/2019 4:23 PM

## 2019-04-12 ENCOUNTER — Encounter: Payer: Self-pay | Admitting: Cardiology

## 2019-04-12 ENCOUNTER — Ambulatory Visit (INDEPENDENT_AMBULATORY_CARE_PROVIDER_SITE_OTHER): Payer: Medicare HMO | Admitting: Cardiology

## 2019-04-12 VITALS — BP 120/64 | HR 62 | Ht 70.5 in | Wt 157.0 lb

## 2019-04-12 DIAGNOSIS — I5032 Chronic diastolic (congestive) heart failure: Secondary | ICD-10-CM

## 2019-04-12 DIAGNOSIS — E782 Mixed hyperlipidemia: Secondary | ICD-10-CM | POA: Diagnosis not present

## 2019-04-12 DIAGNOSIS — I110 Hypertensive heart disease with heart failure: Secondary | ICD-10-CM

## 2019-04-12 DIAGNOSIS — I25118 Atherosclerotic heart disease of native coronary artery with other forms of angina pectoris: Secondary | ICD-10-CM

## 2019-04-12 DIAGNOSIS — I340 Nonrheumatic mitral (valve) insufficiency: Secondary | ICD-10-CM | POA: Diagnosis not present

## 2019-04-12 NOTE — Patient Instructions (Signed)
Medication Instructions:  Your physician recommends that you continue on your current medications as directed. Please refer to the Current Medication list given to you today.  *If you need a refill on your cardiac medications before your next appointment, please call your pharmacy*  Lab Work: None  If you have labs (blood work) drawn today and your tests are completely normal, you will receive your results only by: Marland Kitchen MyChart Message (if you have MyChart) OR . A paper copy in the mail If you have any lab test that is abnormal or we need to change your treatment, we will call you to review the results.  Testing/Procedures: None  Follow-Up: At [facility], you and your health needs are our priority.  As part of our continuing mission to provide you with exceptional heart care, we have created designated Provider Care Teams.  These Care Teams include your primary Cardiologist (physician) and Advanced Practice Providers (APPs -  Physician Assistants and Nurse Practitioners) who all work together to provide you with the care you need, when you need it.  Your next appointment:   6 month(s)  The format for your next appointment:   In Person  Provider:   Shirlee More, MD

## 2019-04-18 ENCOUNTER — Other Ambulatory Visit: Payer: Self-pay

## 2019-04-18 ENCOUNTER — Encounter: Payer: Self-pay | Admitting: Surgery

## 2019-04-18 ENCOUNTER — Ambulatory Visit (INDEPENDENT_AMBULATORY_CARE_PROVIDER_SITE_OTHER): Payer: Medicare HMO | Admitting: Surgery

## 2019-04-18 DIAGNOSIS — I7025 Atherosclerosis of native arteries of other extremities with ulceration: Secondary | ICD-10-CM | POA: Diagnosis not present

## 2019-04-18 DIAGNOSIS — I6523 Occlusion and stenosis of bilateral carotid arteries: Secondary | ICD-10-CM

## 2019-04-18 NOTE — Progress Notes (Signed)
[facility] [place]  Patient name: Ian Cummings MRN: [id number] DOB: Jan 20, 1929 Sex: male      Virtual Visit via Telephone Note   This visit type was conducted due to national recommendations for restrictions regarding the COVID-19 Pandemic (e.g. social distancing) in an effort to limit this patient's exposure and mitigate transmission in our community.  Due to his co-morbid illnesses, this patient is at least at moderate risk for complications without adequate follow up.  This format is felt to be most appropriate for this patient at this time.  The patient did not have access to video technology/had technical difficulties with video requiring transitioning to audio format only (telephone).  All issues noted in this document were discussed and addressed.  No physical exam could be performed with this format.    Patient Location: Home Provider Location: Office    REASON FOR APPOINTMENT:    Follow up  HISTORY OF PRESENT ILLNESS:   Ian Cummings is a 85 y.o. male who is status post right carotid stent performed at [facility] in 2013.  He is also status post right external iliac, common femoral, and profunda femoral artery endarterectomy with bovine pericardial patch angioplasty, followed by a right common femoral to below knee popliteal artery bypass graft with 6 mm Gore-Tex on 08-03-2015 by me  for a right heel ulcer and a right great toe ulcer, both of which have healed.    He says that he has done very well this year without any significant issues.  He is a former smoker.  He continues to take dual antiplatelet therapy for his coronary artery disease and peripheral vascular disease.  His blood pressure is medically managed and he is on a statin for hypercholesterolemia.  The patient does not have symptoms concerning for COVID-19 infection (fever, chills, cough, or new shortness of breath).   PAST MEDICAL HISTORY    Past Medical History:    Diagnosis Date  . AAA (abdominal aortic aneurysm) ([place])    2004, Pt stated not there now  . Anginal pain ([place])   . Anxiety   . Arthritis   . BPH (benign prostatic hypertrophy)   . CAD (coronary artery disease)   . Carotid artery disease ([place])    Right carotid stent 2013  . Claudication ([place])   . Colon polyps    adenomatous  . Complex sleep apnea syndrome 11/11/2017  . Coronary artery disease   . Depression   . GERD (gastroesophageal reflux disease)   . Gout   . H/O hiatal hernia   . Hyperlipidemia   . Hypertension   . Hypothyroidism   . MI (myocardial infarction) ([place]) 4/14  . Neuropathy    Hx: of in B/L toes  . Peripheral vascular disease ([place])    with claudication  . Pre-diabetes   . Stroke ([place])   . TIA (transient ischemic attack)    Hx: of     FAMILY HISTORY   Family History  Problem Relation Age of Onset  . Heart disease Mother        After 56 yrs of age  . Heart attack Mother   . Hyperlipidemia Father   . Hypertension Father   . Heart disease Father        After 81 yrs of age  . Diabetes Father   . Stroke Sister   . Colon cancer Neg Hx   . Stomach cancer  Neg Hx   . Rectal cancer Neg Hx   . Esophageal cancer Neg Hx   . Liver disease Neg Hx   . Kidney disease Neg Hx     SOCIAL HISTORY:   Social History   Socioeconomic History  . Marital status: Married    Spouse name: Not on file  . Number of children: 3  . Years of education: Not on file  . Highest education level: Not on file  Occupational History  . Occupation: retired  Tobacco Use  . Smoking status: Former Smoker    Types: Cigarettes    Quit date: 04/29/1975    Years since quitting: 44.0  . Smokeless tobacco: Never Used  Substance and Sexual Activity  . Alcohol use: Yes    Alcohol/week: 3.0 standard drinks    Types: 3 Glasses of wine per week    Comment: 3 glasses of wine today  . Drug use: No  . Sexual activity: Not Currently  Other Topics Concern  . 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 Difficulty of Paying Living Expenses: Not on file  Food Insecurity:   . Worried About Charity fundraiser in the Last Year: Not on file  . Ran Out of Food in the Last Year: Not on file  Transportation Needs:   . Lack of Transportation (Medical): Not on file  . Lack of Transportation (Non-Medical): Not on file  Physical Activity:   . Days of Exercise per Week: Not on file  . Minutes of Exercise per Session: Not on file  Stress:   . Feeling of Stress : Not on file  Social Connections:   . Frequency of Communication with Friends and Family: Not on file  . Frequency of Social Gatherings with Friends and Family: Not on file  . Attends Religious Services: Not on file  . Active Member of Clubs or Organizations: Not on file  . Attends Archivist Meetings: Not on file  . Marital Status: Not on file  Intimate Partner Violence:   . Fear of Current or Ex-Partner: Not on file  . Emotionally Abused: Not on file  . Physically Abused: Not on file  . Sexually Abused: Not on file    ALLERGIES:    Allergies  Allergen Reactions  . Amoxicillin Rash  . Metoprolol Itching    CURRENT MEDICATIONS:    Current Outpatient Medications  Medication Sig Dispense Refill  . ACCU-CHEK AVIVA PLUS test strip     . allopurinol (ZYLOPRIM) 100 MG tablet Take 100 mg by mouth daily.     Marland Kitchen amLODipine (NORVASC) 5 MG tablet Take 5 mg by mouth daily.     Marland Kitchen aspirin EC 81 MG tablet Take 81 mg by mouth daily.     . B Complex Vitamins (B COMPLEX 1 PO) Take 1 tablet by mouth daily.    . Blood Glucose Monitoring Suppl (ACCU-CHEK AVIVA PLUS) w/Device KIT     . clopidogrel (PLAVIX) 75 MG tablet Take 75 mg by mouth daily.    Marland Kitchen escitalopram (LEXAPRO) 10 MG tablet Take 10 mg by mouth daily.    . furosemide (LASIX) 20 MG tablet Take 1 tablet (20 mg total) by mouth daily. 30 tablet 1  . furosemide  (LASIX) 20 MG tablet TAKE 1 TABLET BY MOUTH EVERY DAY 30 tablet 1  . isosorbide mononitrate (IMDUR) 30 MG 24 hr tablet  Take 30 mg by mouth daily.     Marland Kitchen levothyroxine (SYNTHROID, LEVOTHROID) 88 MCG tablet Take 88 mcg by mouth daily before breakfast.    . lisinopril (ZESTRIL) 40 MG tablet 40 mg daily.    . metoprolol succinate (TOPROL-XL) 50 MG 24 hr tablet Take 50 mg by mouth daily.    . multivitamin (RENA-VIT) TABS tablet Take 1 tablet by mouth daily.    Marland Kitchen omeprazole (PRILOSEC) 20 MG capsule Take 20 mg by mouth daily.    Marland Kitchen RANEXA 500 MG 12 hr tablet Take 500 mg by mouth 2 (two) times daily.     . rosuvastatin (CRESTOR) 40 MG tablet Take 40 mg by mouth daily.     No current facility-administered medications for this visit.    REVIEW OF SYSTEMS:   Please see the history of present illness.     All other systems reviewed and are negative.  PHYSICAL EXAM:   There were no vitals filed for this visit.  Physical exam was not performed given the visit was done via telephone   Recent Labs: 01/12/2019: Hemoglobin 12.0; Platelets 148 02/01/2019: BUN 31; Creatinine, Ser 1.30; Magnesium 2.2; Potassium 5.1; Sodium 137   Recent Lipid Panel Lab Results  Component Value Date/Time   CHOL  04/18/2009 02:41 AM    109        ATP III CLASSIFICATION:  <200     mg/dL   Desirable  200-239  mg/dL   Borderline High  >=240    mg/dL   High          TRIG 114 04/18/2009 02:41 AM   HDL 34 (L) 04/18/2009 02:41 AM   CHOLHDL 3.2 04/18/2009 02:41 AM   LDLCALC  04/18/2009 02:41 AM    52        Total Cholesterol/HDL:CHD Risk Coronary Heart Disease Risk Table                     Men   Women  1/2 Average Risk   3.4   3.3  Average Risk       5.0   4.4  2 X Average Risk   9.6   7.1  3 X Average Risk  23.4   11.0        Use the calculated Patient Ratio above and the CHD Risk Table to determine the patient's CHD Risk.        ATP III CLASSIFICATION (LDL):  <100     mg/dL   Optimal  100-129  mg/dL   Near  or Above                    Optimal  130-159  mg/dL   Borderline  160-189  mg/dL   High  >190     mg/dL   Very High    Wt Readings from Last 3 Encounters:  04/12/19 157 lb (71.2 kg)  04/11/19 160 lb (72.6 kg)  02/01/19 158 lb 6.4 oz (71.8 kg)     STUDIES:   I have reviewed the following ABI/TBIToday's ABIToday's TBIPrevious ABIPrevious TBI +-------+-----------+-----------+------------+------------+ Right  0.91       0.66       1.01        0.75         +-------+-----------+-----------+------------+------------+ Left   0.53       0.41       0.60        0.35         +-------+-----------+-----------+------------+------------+  Right Carotid: Non-hemodynamically significant plaque <50% noted in the CCA. The                ECA appears >50% stenosed. Innominate artery - 242 cm/s;                Turbulent flow                Patent right CCA/ICA stent without evidence of stenosis.  Left Carotid: Velocities in the left ICA are consistent with a 40-59% stenosis.               Non-hemodynamically significant plaque <50% noted in the CCA. The               ECA appears >50% stenosed. Unable to duplicate elevated velocities               from prior exam; these may be obscured by calcified plaque.  Vertebrals:  Bilateral vertebral arteries demonstrate antegrade flow. Right              vertebral artery demonstrates systolic deceleration. Subclavians: Right subclavian artery was stenotic. Right subclavian artery flow              was disturbed. Normal flow hemodynamics were seen in the left              subclavian artery. ASSESSMENT and PLAN   Carotid:  Remains asymptomatic.  I will repeat carotid duplex studies in 6 months  LE PAD:  He is not having trouble with ambulation.  ABi's have slightly dropped slightly, therefore I will bring him back in 6 months and get ABIs as well as a duplex.  He did say he was a little dizzy today.  He has had an increase in his diuretic  and feels that this is all blood pressure related.  He has a blood pressure cuff at home but feels that it is not accurate and usually 20-30 points high.  It was measuring 130.  He is going to get his wife to take him to the drugstore to have it checked on a different machine.  He will reach out to his medical doctors for further guidance once he gets accurate measurements.   Time:   Today, I have spent 15 minutes with the patient with telehealth technology discussing the above problems.     Leia Alf, MD, FACS [facility] [phone] Pager [phone]

## 2019-04-27 DIAGNOSIS — M791 Myalgia, unspecified site: Secondary | ICD-10-CM | POA: Insufficient documentation

## 2019-04-27 DIAGNOSIS — R5383 Other fatigue: Secondary | ICD-10-CM | POA: Diagnosis not present

## 2019-04-27 DIAGNOSIS — I340 Nonrheumatic mitral (valve) insufficiency: Secondary | ICD-10-CM | POA: Diagnosis not present

## 2019-04-27 DIAGNOSIS — I10 Essential (primary) hypertension: Secondary | ICD-10-CM | POA: Diagnosis not present

## 2019-04-28 ENCOUNTER — Ambulatory Visit: Payer: Medicare HMO | Attending: Internal Medicine

## 2019-04-28 ENCOUNTER — Other Ambulatory Visit: Payer: Self-pay | Admitting: *Deleted

## 2019-04-28 DIAGNOSIS — I6523 Occlusion and stenosis of bilateral carotid arteries: Secondary | ICD-10-CM

## 2019-04-28 DIAGNOSIS — E038 Other specified hypothyroidism: Secondary | ICD-10-CM | POA: Diagnosis not present

## 2019-04-28 DIAGNOSIS — E7849 Other hyperlipidemia: Secondary | ICD-10-CM | POA: Diagnosis not present

## 2019-04-28 DIAGNOSIS — I7025 Atherosclerosis of native arteries of other extremities with ulceration: Secondary | ICD-10-CM

## 2019-04-28 DIAGNOSIS — R5383 Other fatigue: Secondary | ICD-10-CM | POA: Diagnosis not present

## 2019-04-28 DIAGNOSIS — Z20822 Contact with and (suspected) exposure to covid-19: Secondary | ICD-10-CM

## 2019-04-28 DIAGNOSIS — Z20828 Contact with and (suspected) exposure to other viral communicable diseases: Secondary | ICD-10-CM | POA: Diagnosis not present

## 2019-04-28 DIAGNOSIS — I10 Essential (primary) hypertension: Secondary | ICD-10-CM | POA: Diagnosis not present

## 2019-04-28 DIAGNOSIS — M791 Myalgia, unspecified site: Secondary | ICD-10-CM | POA: Diagnosis not present

## 2019-04-29 LAB — NOVEL CORONAVIRUS, NAA: SARS-CoV-2, NAA: NOT DETECTED

## 2019-05-05 ENCOUNTER — Ambulatory Visit: Payer: Medicare HMO | Admitting: Neurology

## 2019-05-09 ENCOUNTER — Other Ambulatory Visit: Payer: Self-pay | Admitting: *Deleted

## 2019-05-09 DIAGNOSIS — I7025 Atherosclerosis of native arteries of other extremities with ulceration: Secondary | ICD-10-CM

## 2019-05-09 DIAGNOSIS — I6523 Occlusion and stenosis of bilateral carotid arteries: Secondary | ICD-10-CM

## 2019-05-14 ENCOUNTER — Ambulatory Visit: Payer: Medicare HMO | Attending: Internal Medicine

## 2019-05-14 NOTE — Progress Notes (Signed)
   Covid-19 Vaccination Clinic  Name:  Ian Cummings    MRN: [id number] DOB: [birth year]  05/14/2019  Mr. Gerdes was observed post Covid-19 immunization for 15 minutes without incidence. He was provided with Vaccine Information Sheet and instruction to access the V-Safe system.   Mr. Moring was instructed to call 911 with any severe reactions post vaccine: Marland Kitchen Difficulty breathing  . Swelling of your face and throat  . A fast heartbeat  . A bad rash all over your body  . Dizziness and weakness

## 2019-06-02 ENCOUNTER — Ambulatory Visit: Payer: Medicare HMO | Attending: Internal Medicine

## 2019-06-02 DIAGNOSIS — Z23 Encounter for immunization: Secondary | ICD-10-CM

## 2019-06-02 NOTE — Progress Notes (Signed)
   Covid-19 Vaccination Clinic  Name:  Ian Cummings    MRN: [id number] DOB: 08-13-1928  06/02/2019  [name] was observed post Covid-19 immunization for 15 minutes without incidence. He was provided with Vaccine Information Sheet and instruction to access the V-Safe system.   [name] was instructed to call 911 with any severe reactions post vaccine: Marland Kitchen Difficulty breathing  . Swelling of your face and throat  . A fast heartbeat  . A bad rash all over your body  . Dizziness and weakness    Immunizations Administered    Name Date Dose VIS Date Route   Pfizer COVID-19 Vaccine 06/02/2019  2:54 PM 0.3 mL 04/08/2019 Intramuscular   Manufacturer: [place]   Lot: [id number]   [place]: [id number]

## 2019-07-28 DIAGNOSIS — M25512 Pain in left shoulder: Secondary | ICD-10-CM | POA: Diagnosis not present

## 2019-08-11 DIAGNOSIS — L820 Inflamed seborrheic keratosis: Secondary | ICD-10-CM | POA: Diagnosis not present

## 2019-08-11 DIAGNOSIS — L821 Other seborrheic keratosis: Secondary | ICD-10-CM | POA: Diagnosis not present

## 2019-08-11 DIAGNOSIS — L570 Actinic keratosis: Secondary | ICD-10-CM | POA: Diagnosis not present

## 2019-09-09 ENCOUNTER — Inpatient Hospital Stay (HOSPITAL_COMMUNITY)
Admission: EM | Admit: 2019-09-09 | Discharge: 2019-09-12 | DRG: 303 | Disposition: A | Discharge status: Home / Self Care | Payer: Medicare HMO | Attending: Student | Admitting: Student

## 2019-09-09 ENCOUNTER — Other Ambulatory Visit: Payer: Self-pay

## 2019-09-09 ENCOUNTER — Emergency Department (HOSPITAL_COMMUNITY): Payer: Medicare HMO

## 2019-09-09 ENCOUNTER — Encounter (HOSPITAL_COMMUNITY): Payer: Self-pay

## 2019-09-09 DIAGNOSIS — I130 Hypertensive heart and chronic kidney disease with heart failure and stage 1 through stage 4 chronic kidney disease, or unspecified chronic kidney disease: Secondary | ICD-10-CM | POA: Diagnosis present

## 2019-09-09 DIAGNOSIS — R7303 Prediabetes: Secondary | ICD-10-CM | POA: Diagnosis present

## 2019-09-09 DIAGNOSIS — I5032 Chronic diastolic (congestive) heart failure: Secondary | ICD-10-CM | POA: Diagnosis not present

## 2019-09-09 DIAGNOSIS — I440 Atrioventricular block, first degree: Secondary | ICD-10-CM | POA: Diagnosis present

## 2019-09-09 DIAGNOSIS — R0602 Shortness of breath: Secondary | ICD-10-CM | POA: Diagnosis not present

## 2019-09-09 DIAGNOSIS — I200 Unstable angina: Secondary | ICD-10-CM

## 2019-09-09 DIAGNOSIS — I25110 Atherosclerotic heart disease of native coronary artery with unstable angina pectoris: Principal | ICD-10-CM | POA: Diagnosis present

## 2019-09-09 DIAGNOSIS — I739 Peripheral vascular disease, unspecified: Secondary | ICD-10-CM | POA: Diagnosis not present

## 2019-09-09 DIAGNOSIS — Z20822 Contact with and (suspected) exposure to covid-19: Secondary | ICD-10-CM | POA: Diagnosis not present

## 2019-09-09 DIAGNOSIS — N1831 Chronic kidney disease, stage 3a: Secondary | ICD-10-CM | POA: Diagnosis not present

## 2019-09-09 DIAGNOSIS — Z955 Presence of coronary angioplasty implant and graft: Secondary | ICD-10-CM

## 2019-09-09 DIAGNOSIS — R001 Bradycardia, unspecified: Secondary | ICD-10-CM | POA: Diagnosis present

## 2019-09-09 DIAGNOSIS — I252 Old myocardial infarction: Secondary | ICD-10-CM

## 2019-09-09 DIAGNOSIS — Z823 Family history of stroke: Secondary | ICD-10-CM

## 2019-09-09 DIAGNOSIS — R0789 Other chest pain: Secondary | ICD-10-CM | POA: Diagnosis not present

## 2019-09-09 DIAGNOSIS — I361 Nonrheumatic tricuspid (valve) insufficiency: Secondary | ICD-10-CM | POA: Diagnosis not present

## 2019-09-09 DIAGNOSIS — M109 Gout, unspecified: Secondary | ICD-10-CM | POA: Diagnosis present

## 2019-09-09 DIAGNOSIS — Z83438 Family history of other disorder of lipoprotein metabolism and other lipidemia: Secondary | ICD-10-CM

## 2019-09-09 DIAGNOSIS — I350 Nonrheumatic aortic (valve) stenosis: Secondary | ICD-10-CM | POA: Diagnosis not present

## 2019-09-09 DIAGNOSIS — Z951 Presence of aortocoronary bypass graft: Secondary | ICD-10-CM | POA: Diagnosis not present

## 2019-09-09 DIAGNOSIS — E039 Hypothyroidism, unspecified: Secondary | ICD-10-CM | POA: Diagnosis present

## 2019-09-09 DIAGNOSIS — G4733 Obstructive sleep apnea (adult) (pediatric): Secondary | ICD-10-CM | POA: Diagnosis present

## 2019-09-09 DIAGNOSIS — I10 Essential (primary) hypertension: Secondary | ICD-10-CM | POA: Diagnosis not present

## 2019-09-09 DIAGNOSIS — I340 Nonrheumatic mitral (valve) insufficiency: Secondary | ICD-10-CM | POA: Diagnosis present

## 2019-09-09 DIAGNOSIS — Z888 Allergy status to other drugs, medicaments and biological substances status: Secondary | ICD-10-CM

## 2019-09-09 DIAGNOSIS — Z79899 Other long term (current) drug therapy: Secondary | ICD-10-CM

## 2019-09-09 DIAGNOSIS — Z8673 Personal history of transient ischemic attack (TIA), and cerebral infarction without residual deficits: Secondary | ICD-10-CM | POA: Diagnosis not present

## 2019-09-09 DIAGNOSIS — Z789 Other specified health status: Secondary | ICD-10-CM | POA: Diagnosis not present

## 2019-09-09 DIAGNOSIS — Z87891 Personal history of nicotine dependence: Secondary | ICD-10-CM

## 2019-09-09 DIAGNOSIS — N400 Enlarged prostate without lower urinary tract symptoms: Secondary | ICD-10-CM | POA: Diagnosis present

## 2019-09-09 DIAGNOSIS — R079 Chest pain, unspecified: Secondary | ICD-10-CM | POA: Diagnosis not present

## 2019-09-09 DIAGNOSIS — K5900 Constipation, unspecified: Secondary | ICD-10-CM | POA: Diagnosis present

## 2019-09-09 DIAGNOSIS — E782 Mixed hyperlipidemia: Secondary | ICD-10-CM | POA: Diagnosis not present

## 2019-09-09 DIAGNOSIS — Z7982 Long term (current) use of aspirin: Secondary | ICD-10-CM

## 2019-09-09 DIAGNOSIS — I110 Hypertensive heart disease with heart failure: Secondary | ICD-10-CM | POA: Diagnosis not present

## 2019-09-09 DIAGNOSIS — Z79890 Hormone replacement therapy (postmenopausal): Secondary | ICD-10-CM

## 2019-09-09 DIAGNOSIS — J449 Chronic obstructive pulmonary disease, unspecified: Secondary | ICD-10-CM | POA: Diagnosis present

## 2019-09-09 DIAGNOSIS — I4891 Unspecified atrial fibrillation: Secondary | ICD-10-CM | POA: Diagnosis not present

## 2019-09-09 DIAGNOSIS — Z833 Family history of diabetes mellitus: Secondary | ICD-10-CM

## 2019-09-09 DIAGNOSIS — K219 Gastro-esophageal reflux disease without esophagitis: Secondary | ICD-10-CM | POA: Diagnosis present

## 2019-09-09 DIAGNOSIS — Z66 Do not resuscitate: Secondary | ICD-10-CM | POA: Diagnosis not present

## 2019-09-09 DIAGNOSIS — F419 Anxiety disorder, unspecified: Secondary | ICD-10-CM | POA: Diagnosis present

## 2019-09-09 DIAGNOSIS — I4821 Permanent atrial fibrillation: Secondary | ICD-10-CM | POA: Diagnosis not present

## 2019-09-09 DIAGNOSIS — Z8249 Family history of ischemic heart disease and other diseases of the circulatory system: Secondary | ICD-10-CM

## 2019-09-09 DIAGNOSIS — I480 Paroxysmal atrial fibrillation: Secondary | ICD-10-CM

## 2019-09-09 DIAGNOSIS — Z881 Allergy status to other antibiotic agents status: Secondary | ICD-10-CM

## 2019-09-09 DIAGNOSIS — Z7902 Long term (current) use of antithrombotics/antiplatelets: Secondary | ICD-10-CM

## 2019-09-09 LAB — BASIC METABOLIC PANEL
Anion gap: 9 (ref 5–15)
BUN: 17 mg/dL (ref 8–23)
CO2: 25 mmol/L (ref 22–32)
Calcium: 9.4 mg/dL (ref 8.9–10.3)
Chloride: 106 mmol/L (ref 98–111)
Creatinine, Ser: 1.29 mg/dL — ABNORMAL HIGH (ref 0.61–1.24)
GFR calc Af Amer: 57 mL/min — ABNORMAL LOW (ref >60)
GFR calc non Af Amer: 49 mL/min — ABNORMAL LOW (ref >60)
Glucose, Bld: 93 mg/dL (ref 70–99)
Potassium: 4.2 mmol/L (ref 3.5–5.1)
Sodium: 140 mmol/L (ref 135–145)

## 2019-09-09 LAB — CBC
HCT: 42.6 % (ref 39.0–52.0)
Hemoglobin: 14.1 g/dL (ref 13.0–17.0)
MCH: 32.2 pg (ref 26.0–34.0)
MCHC: 33.1 g/dL (ref 30.0–36.0)
MCV: 97.3 fL (ref 80.0–100.0)
Platelets: 180 10*3/uL (ref 150–400)
RBC: 4.38 MIL/uL (ref 4.22–5.81)
RDW: 13.4 % (ref 11.5–15.5)
WBC: 7.3 10*3/uL (ref 4.0–10.5)
nRBC: 0 % (ref 0.0–0.2)

## 2019-09-09 LAB — TROPONIN I (HIGH SENSITIVITY)
Troponin I (High Sensitivity): 17 ng/L (ref <18)
Troponin I (High Sensitivity): 24 ng/L — ABNORMAL HIGH (ref <18)

## 2019-09-09 MED ORDER — HYDRALAZINE HCL 20 MG/ML IJ SOLN
10.0000 mg | Freq: Once | INTRAMUSCULAR | Status: AC
  Administered 2019-09-09: 10 mg via INTRAVENOUS
  Filled 2019-09-09: qty 1

## 2019-09-09 MED ORDER — OXYCODONE HCL 5 MG PO TABS
5.0000 mg | ORAL_TABLET | Freq: Once | ORAL | Status: AC
  Administered 2019-09-09: 5 mg via ORAL
  Filled 2019-09-09: qty 1

## 2019-09-09 MED ORDER — ASPIRIN 81 MG PO CHEW
324.0000 mg | CHEWABLE_TABLET | Freq: Once | ORAL | Status: AC
  Administered 2019-09-09: 324 mg via ORAL
  Filled 2019-09-09: qty 4

## 2019-09-09 MED ORDER — SODIUM CHLORIDE 0.9% FLUSH: 3.0000 mL | Freq: Once | INTRAVENOUS | Status: DC

## 2019-09-09 NOTE — ED Triage Notes (Signed)
Pt arrives to ED w/ c/o chest tightness that started yesterday. Pt denies sob, n/v. Pt states he took 2 nitro and that helped.

## 2019-09-09 NOTE — ED Provider Notes (Signed)
[facility] Emergency Department Provider Note MRN:  [id number]  Arrival date & time: 09/09/19     Chief Complaint   Chest Pain   History of Present Illness   Ian Cummings is a 86 y.o. year-old male with a history of CAD presenting to the ED with chief complaint of chest pain.  Chest pain intermittently since yesterday morning.  Described as a pressure-like pain, center of the chest.  Occasionally happening at rest but definitely worse or exacerbated with exertion.  Pain became more severe while walking from the waiting room to his ED hospital room.  Currently pain is 4 out of 5, denies dizziness or sweatiness, no nausea or vomiting, no shortness of breath.  Feels similar to prior heart related pain, history of multiple heart attacks.  Feels like his pain has been related to high blood pressure recordings at home.  Review of Systems  A complete 10 system review of systems was obtained and all systems are negative except as noted in the HPI and PMH.   Patient's Health History    Past Medical History:  Diagnosis Date  . AAA (abdominal aortic aneurysm) ([place])    2004, Pt stated not there now  . Anginal pain ([place])   . Anxiety   . Arthritis   . BPH (benign prostatic hypertrophy)   . CAD (coronary artery disease)   . Carotid artery disease ([place])    Right carotid stent 2013  . Claudication ([place])   . Colon polyps    adenomatous  . Complex sleep apnea syndrome 11/11/2017  . Coronary artery disease   . Depression   . GERD (gastroesophageal reflux disease)   . Gout   . H/O hiatal hernia   . Hyperlipidemia   . Hypertension   . Hypothyroidism   . MI (myocardial infarction) ([place]) 4/14  . Neuropathy    Hx: of in B/L toes  . Peripheral vascular disease ([place])    with claudication  . Pre-diabetes   . Stroke ([place])   . TIA (transient ischemic attack)    Hx: of    Past Surgical History:  Procedure Laterality Date  . CAROTID STENT INSERTION Right 2013   right  [facility]  . CATARACT EXTRACTION Bilateral    Hx: of both eyes  . COLONOSCOPY     Hx: of  . COLONOSCOPY    . CORONARY ANGIOPLASTY WITH STENT PLACEMENT  2010  . CORONARY ARTERY BYPASS GRAFT  1995  . CORONARY ARTERY BYPASS GRAFT N/A 10/18/2012   Procedure: REDO CORONARY ARTERY BYPASS GRAFTING (CABG);  Surgeon: Grace Isaac, MD;  Location: [place];  Service: Open Heart Surgery;  Laterality: N/A;  off pump times two using endoscopically harvested left saphenous vein  . CORONARY STENT PLACEMENT  06/2016  . ENDARTERECTOMY FEMORAL Right 08/03/2015   Procedure: RIGHT FEMORAL ENDARTERECTOMY WITH PATCH ANGIOPLASTY;  Surgeon: Serafina Mitchell, MD;  Location: [place];  Service: Vascular;  Laterality: Right;  . FEMORAL-POPLITEAL BYPASS GRAFT Right 08/03/2015   Procedure: RIGHT FEMORAL-BELOW KNEE POPLITEAL ARTERY BYPASS GRAFT;  Surgeon: Serafina Mitchell, MD;  Location: [place];  Service: Vascular;  Laterality: Right;  . FOOT SURGERY Left   . FRACTURE SURGERY Left    Hx: of Left heel  . INGUINAL HERNIA REPAIR Bilateral    X 2   . INTRAOPERATIVE TRANSESOPHAGEAL ECHOCARDIOGRAM N/A 10/18/2012   Procedure: INTRAOPERATIVE TRANSESOPHAGEAL ECHOCARDIOGRAM;  Surgeon: Grace Isaac, MD;  Location: [place];  Service: Open Heart Surgery;  Laterality: N/A;  . LEFT HEART CATHETERIZATION WITH CORONARY/GRAFT ANGIOGRAM N/A 02/02/2013   Procedure: LEFT HEART CATHETERIZATION WITH Beatrix Fetters;  Surgeon: Jacolyn Reedy, MD;  Location: [facility] C[facility];  Service: Cardiovascular;  Laterality: N/A;  . LOWER EXTREMITY ANGIOGRAM Right 06/27/2015   Procedure: Lower Extremity Angiogram;  Surgeon: Serafina Mitchell, MD;  Location: [place] CV LAB;  Service: Cardiovascular;  Laterality: Right;  . MASTOIDECTOMY  1933  . PERIPHERAL VASCULAR CATHETERIZATION N/A 01/23/2015   Procedure: Abdominal Aortogram;  Surgeon: Serafina Mitchell, MD;  Location: [place] CV LAB;  Service: Cardiovascular;  Laterality: N/A;  . PERIPHERAL  VASCULAR CATHETERIZATION N/A 06/27/2015   Procedure: Abdominal Aortogram;  Surgeon: Serafina Mitchell, MD;  Location: [place] CV LAB;  Service: Cardiovascular;  Laterality: N/A;  . RETINAL DETACHMENT SURGERY Left    Hx: of left eye  . TONSILLECTOMY      Family History  Problem Relation Age of Onset  . Heart disease Mother        After 75 yrs of age  . Heart attack Mother   . Hyperlipidemia Father   . Hypertension Father   . Heart disease Father        After 65 yrs of age  . Diabetes Father   . Stroke Sister   . Colon cancer Neg Hx   . Stomach cancer Neg Hx   . Rectal cancer Neg Hx   . Esophageal cancer Neg Hx   . Liver disease Neg Hx   . Kidney disease Neg Hx     Social History   Socioeconomic History  . Marital status: Married    Spouse name: Not on file  . Number of children: 3  . Years of education: Not on file  . Highest education level: Not on file  Occupational History  . Occupation: retired  Tobacco Use  . Smoking status: Former Smoker    Types: Cigarettes    Quit date: 04/29/1975    Years since quitting: 44.3  . Smokeless tobacco: Never Used  Substance and Sexual Activity  . Alcohol use: Yes    Alcohol/week: 3.0 standard drinks    Types: 3 Glasses of wine per week    Comment: 3 glasses of wine today  . Drug use: No  . Sexual activity: Not Currently  Other Topics Concern  . 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 Difficulty of Paying Living Expenses:   Food Insecurity:   . Worried About Charity fundraiser in the Last Year:   . Arboriculturist in the Last Year:   Transportation Needs:   . Film/video editor (Medical):   Marland Kitchen Lack of Transportation (Non-Medical):   Physical Activity:   . Days of Exercise per Week:   . Minutes of Exercise per Session:   Stress:   . Feeling of Stress :   Social Connections:   . Frequency of Communication with Friends  and Family:   . Frequency of Social Gatherings with Friends and Family:   . Attends Religious Services:   . Active Member of Clubs or Organizations:   . Attends Archivist Meetings:   Marland Kitchen Marital Status:   Intimate Partner Violence:   . Fear of Current or Ex-Partner:   . Emotionally Abused:   Marland Kitchen Physically Abused:   . Sexually Abused:      Physical Exam  Vitals:   09/09/19 2300 09/09/19 2315  BP: (!) 179/71 (!) 181/70  Pulse: 61 (!) 58  Resp: 13 10  Temp:    SpO2: 100% 99%    CONSTITUTIONAL: Well-appearing, NAD NEURO:  Alert and oriented x 3, no focal deficits EYES:  eyes equal and reactive ENT/NECK:  no LAD, no JVD CARDIO: Regular rate, well-perfused, normal S1 and S2 PULM:  CTAB no wheezing or rhonchi GI/GU:  normal bowel sounds, non-distended, non-tender MSK/SPINE:  No gross deformities, no edema SKIN:  no rash, atraumatic PSYCH:  Appropriate speech and behavior  *Additional and/or pertinent findings included in MDM below  Diagnostic and Interventional Summary    EKG Interpretation  Date/Time:  Friday Sep 09 2019 12:49:05 EDT Ventricular Rate:  102 PR Interval:    QRS Duration: 90 QT Interval:  352 QTC Calculation: 458 R Axis:   -3 Text Interpretation: Atrial fibrillation with rapid ventricular response Minimal voltage criteria for LVH, may be normal variant ( R in aVL ) Inferior infarct , age undetermined Possible Anterior infarct , age undetermined Abnormal ECG Confirmed by Gerlene Fee [phone]) on 09/09/2019 10:54:10 PM      Labs Reviewed  BASIC METABOLIC PANEL - Abnormal; Notable for the following components:      Result Value   Creatinine, Ser 1.29 (*)    GFR calc non Af Amer 49 (*)    GFR calc Af Amer 57 (*)    All other components within normal limits  TROPONIN I (HIGH SENSITIVITY) - Abnormal; Notable for the following components:   Troponin I (High Sensitivity) 24 (*)    All other components within normal limits  SARS CORONAVIRUS 2 BY RT  PCR (HOSPITAL ORDER, [place] [facility])  CBC  TROPONIN I (HIGH SENSITIVITY)    DG Chest 2 View  Final Result      Medications  sodium chloride flush (NS) 0.9 % injection 3 mL (has no administration in time range)  aspirin chewable tablet 324 mg (has no administration in time range)  hydrALAZINE (APRESOLINE) injection 10 mg (10 mg Intravenous Given 09/09/19 2314)  oxyCODONE (Oxy IR/ROXICODONE) immediate release tablet 5 mg (5 mg Oral Given 09/09/19 2314)     Procedures  /  Critical Care .Critical Care Performed by: Maudie Flakes, MD Authorized by: Maudie Flakes, MD   Critical care provider statement:    Critical care time (minutes):  33   Critical care was necessary to treat or prevent imminent or life-threatening deterioration of the following conditions: Unstable angina.   Critical care was time spent personally by me on the following activities:  Discussions with consultants, evaluation of patient's response to treatment, examination of patient, ordering and performing treatments and interventions, ordering and review of laboratory studies, ordering and review of radiographic studies, pulse oximetry, re-evaluation of patient's condition, obtaining history from patient or surrogate and review of old charts .1-3 Lead EKG Interpretation Performed by: Maudie Flakes, MD Authorized by: Maudie Flakes, MD     Interpretation: normal     ECG rate assessment: normal     Rhythm: sinus rhythm     Ectopy: none   Comments:     Cardiac monitoring was ordered to monitor the patient for dysrhythmia.  I personally interpreted the patient's cardiac monitor while at the bedside.     ED Course and Medical Decision Making  I have reviewed the triage vital signs, the nursing notes, and pertinent available records from the EMR.  Listed above  are laboratory and imaging tests that I personally ordered, reviewed, and interpreted and then considered in my medical decision  making (see below for details).      Exertional chest pressure, history of 2 MIs, history of 12 stents, has not had ischemic evaluation in the form of stress testing or catheterization in several years.  Troponins are low but mildly elevated.  Seems that his pain is related to elevated blood pressures at home.  Currently with 5 out of 10 pain in the setting of blood pressure [id number] systolic, will provide hydralazine pain medication, admit to hospital service.  EKG with nonspecific changes, A. fib noted on EKG patient is in sinus rhythm in the room.  He does have documented A. fib on prior EKGs.  Does not appear to be anticoagulated.    Barth Kirks. Sedonia Small, MD [place] [email or web address]  Final Clinical Impressions(s) / ED Diagnoses     ICD-10-CM   1. Unstable angina (HCC)  I20.0     ED Discharge Orders    None       Discharge Instructions Discussed with and Provided to Patient:   Discharge Instructions   None       Maudie Flakes, MD 09/09/19 2333

## 2019-09-10 ENCOUNTER — Observation Stay (HOSPITAL_COMMUNITY): Payer: Medicare HMO

## 2019-09-10 ENCOUNTER — Other Ambulatory Visit: Payer: Self-pay

## 2019-09-10 ENCOUNTER — Encounter (HOSPITAL_COMMUNITY): Payer: Self-pay | Admitting: Internal Medicine

## 2019-09-10 DIAGNOSIS — I361 Nonrheumatic tricuspid (valve) insufficiency: Secondary | ICD-10-CM | POA: Diagnosis not present

## 2019-09-10 DIAGNOSIS — Z66 Do not resuscitate: Secondary | ICD-10-CM

## 2019-09-10 DIAGNOSIS — E782 Mixed hyperlipidemia: Secondary | ICD-10-CM | POA: Diagnosis present

## 2019-09-10 DIAGNOSIS — Z20822 Contact with and (suspected) exposure to covid-19: Secondary | ICD-10-CM | POA: Diagnosis present

## 2019-09-10 DIAGNOSIS — J449 Chronic obstructive pulmonary disease, unspecified: Secondary | ICD-10-CM | POA: Diagnosis present

## 2019-09-10 DIAGNOSIS — Z789 Other specified health status: Secondary | ICD-10-CM

## 2019-09-10 DIAGNOSIS — I130 Hypertensive heart and chronic kidney disease with heart failure and stage 1 through stage 4 chronic kidney disease, or unspecified chronic kidney disease: Secondary | ICD-10-CM | POA: Diagnosis present

## 2019-09-10 DIAGNOSIS — I10 Essential (primary) hypertension: Secondary | ICD-10-CM

## 2019-09-10 DIAGNOSIS — I25110 Atherosclerotic heart disease of native coronary artery with unstable angina pectoris: Secondary | ICD-10-CM | POA: Diagnosis present

## 2019-09-10 DIAGNOSIS — Z951 Presence of aortocoronary bypass graft: Secondary | ICD-10-CM | POA: Diagnosis not present

## 2019-09-10 DIAGNOSIS — K219 Gastro-esophageal reflux disease without esophagitis: Secondary | ICD-10-CM | POA: Diagnosis present

## 2019-09-10 DIAGNOSIS — N1831 Chronic kidney disease, stage 3a: Secondary | ICD-10-CM | POA: Diagnosis present

## 2019-09-10 DIAGNOSIS — Z955 Presence of coronary angioplasty implant and graft: Secondary | ICD-10-CM | POA: Diagnosis not present

## 2019-09-10 DIAGNOSIS — R001 Bradycardia, unspecified: Secondary | ICD-10-CM | POA: Diagnosis present

## 2019-09-10 DIAGNOSIS — I739 Peripheral vascular disease, unspecified: Secondary | ICD-10-CM | POA: Diagnosis present

## 2019-09-10 DIAGNOSIS — I5032 Chronic diastolic (congestive) heart failure: Secondary | ICD-10-CM | POA: Diagnosis present

## 2019-09-10 DIAGNOSIS — I110 Hypertensive heart disease with heart failure: Secondary | ICD-10-CM | POA: Diagnosis not present

## 2019-09-10 DIAGNOSIS — I350 Nonrheumatic aortic (valve) stenosis: Secondary | ICD-10-CM

## 2019-09-10 DIAGNOSIS — I200 Unstable angina: Secondary | ICD-10-CM

## 2019-09-10 DIAGNOSIS — M109 Gout, unspecified: Secondary | ICD-10-CM | POA: Diagnosis present

## 2019-09-10 DIAGNOSIS — R079 Chest pain, unspecified: Secondary | ICD-10-CM

## 2019-09-10 DIAGNOSIS — I340 Nonrheumatic mitral (valve) insufficiency: Secondary | ICD-10-CM | POA: Diagnosis present

## 2019-09-10 DIAGNOSIS — I4891 Unspecified atrial fibrillation: Secondary | ICD-10-CM

## 2019-09-10 DIAGNOSIS — R7303 Prediabetes: Secondary | ICD-10-CM | POA: Diagnosis present

## 2019-09-10 DIAGNOSIS — K5900 Constipation, unspecified: Secondary | ICD-10-CM | POA: Diagnosis present

## 2019-09-10 DIAGNOSIS — I440 Atrioventricular block, first degree: Secondary | ICD-10-CM | POA: Diagnosis present

## 2019-09-10 DIAGNOSIS — I4821 Permanent atrial fibrillation: Secondary | ICD-10-CM | POA: Diagnosis present

## 2019-09-10 DIAGNOSIS — N400 Enlarged prostate without lower urinary tract symptoms: Secondary | ICD-10-CM | POA: Diagnosis present

## 2019-09-10 DIAGNOSIS — G4733 Obstructive sleep apnea (adult) (pediatric): Secondary | ICD-10-CM | POA: Diagnosis present

## 2019-09-10 DIAGNOSIS — E039 Hypothyroidism, unspecified: Secondary | ICD-10-CM | POA: Diagnosis present

## 2019-09-10 DIAGNOSIS — Z8673 Personal history of transient ischemic attack (TIA), and cerebral infarction without residual deficits: Secondary | ICD-10-CM | POA: Diagnosis not present

## 2019-09-10 DIAGNOSIS — I252 Old myocardial infarction: Secondary | ICD-10-CM | POA: Diagnosis not present

## 2019-09-10 HISTORY — DX: Chronic diastolic (congestive) heart failure: I50.32

## 2019-09-10 HISTORY — DX: Chronic kidney disease, stage 3a: N18.31

## 2019-09-10 HISTORY — DX: Unstable angina: I20.0

## 2019-09-10 LAB — COMPREHENSIVE METABOLIC PANEL
ALT: 26 U/L (ref 0–44)
AST: 26 U/L (ref 15–41)
Albumin: 3.4 g/dL — ABNORMAL LOW (ref 3.5–5.0)
Alkaline Phosphatase: 33 U/L — ABNORMAL LOW (ref 38–126)
Anion gap: 9 (ref 5–15)
BUN: 21 mg/dL (ref 8–23)
CO2: 23 mmol/L (ref 22–32)
Calcium: 9.4 mg/dL (ref 8.9–10.3)
Chloride: 106 mmol/L (ref 98–111)
Creatinine, Ser: 1.2 mg/dL (ref 0.61–1.24)
GFR calc Af Amer: >60 mL/min (ref >60)
GFR calc non Af Amer: 54 mL/min — ABNORMAL LOW (ref >60)
Glucose, Bld: 93 mg/dL (ref 70–99)
Potassium: 3.9 mmol/L (ref 3.5–5.1)
Sodium: 138 mmol/L (ref 135–145)
Total Bilirubin: 0.6 mg/dL (ref 0.3–1.2)
Total Protein: 7.4 g/dL (ref 6.5–8.1)

## 2019-09-10 LAB — CBC WITH DIFFERENTIAL/PLATELET
Abs Immature Granulocytes: 0.09 10*3/uL — ABNORMAL HIGH (ref 0.00–0.07)
Basophils Absolute: 0 10*3/uL (ref 0.0–0.1)
Basophils Relative: 0 %
Eosinophils Absolute: 0.1 10*3/uL (ref 0.0–0.5)
Eosinophils Relative: 1 %
HCT: 40.5 % (ref 39.0–52.0)
Hemoglobin: 13.6 g/dL (ref 13.0–17.0)
Immature Granulocytes: 1 %
Lymphocytes Relative: 16 %
Lymphs Abs: 1.1 10*3/uL (ref 0.7–4.0)
MCH: 32.2 pg (ref 26.0–34.0)
MCHC: 33.6 g/dL (ref 30.0–36.0)
MCV: 95.7 fL (ref 80.0–100.0)
Monocytes Absolute: 1 10*3/uL (ref 0.1–1.0)
Monocytes Relative: 15 %
Neutro Abs: 4.6 10*3/uL (ref 1.7–7.7)
Neutrophils Relative %: 67 %
Platelets: 190 10*3/uL (ref 150–400)
RBC: 4.23 MIL/uL (ref 4.22–5.81)
RDW: 13.7 % (ref 11.5–15.5)
WBC: 7 10*3/uL (ref 4.0–10.5)
nRBC: 0 % (ref 0.0–0.2)

## 2019-09-10 LAB — PROTIME-INR
INR: 1.1 (ref 0.8–1.2)
Prothrombin Time: 13.5 seconds (ref 11.4–15.2)

## 2019-09-10 LAB — TROPONIN I (HIGH SENSITIVITY): Troponin I (High Sensitivity): 23 ng/L — ABNORMAL HIGH (ref <18)

## 2019-09-10 LAB — HEPARIN LEVEL (UNFRACTIONATED): Heparin Unfractionated: 0.28 IU/mL — ABNORMAL LOW (ref 0.30–0.70)

## 2019-09-10 LAB — ECHOCARDIOGRAM COMPLETE
Height: 71 in
Weight: 2475.2 oz

## 2019-09-10 LAB — MAGNESIUM: Magnesium: 1.9 mg/dL (ref 1.7–2.4)

## 2019-09-10 LAB — BRAIN NATRIURETIC PEPTIDE: B Natriuretic Peptide: 284.4 pg/mL — ABNORMAL HIGH (ref 0.0–100.0)

## 2019-09-10 LAB — SARS CORONAVIRUS 2 BY RT PCR (HOSPITAL ORDER, PERFORMED IN ~~LOC~~ HOSPITAL LAB): SARS Coronavirus 2: NEGATIVE

## 2019-09-10 LAB — APTT: aPTT: >200 seconds (ref 24–36)

## 2019-09-10 MED ORDER — ISOSORBIDE MONONITRATE ER 30 MG PO TB24
30.0000 mg | ORAL_TABLET | Freq: Every day | ORAL | Status: DC
  Administered 2019-09-10: 30 mg via ORAL
  Filled 2019-09-10: qty 1

## 2019-09-10 MED ORDER — ISOSORBIDE MONONITRATE ER 60 MG PO TB24
60.0000 mg | ORAL_TABLET | Freq: Every day | ORAL | Status: DC
  Administered 2019-09-11 – 2019-09-12 (×2): 60 mg via ORAL
  Filled 2019-09-10 (×2): qty 1

## 2019-09-10 MED ORDER — ASPIRIN EC 325 MG PO TBEC
325.0000 mg | DELAYED_RELEASE_TABLET | Freq: Every day | ORAL | Status: DC
  Administered 2019-09-10 – 2019-09-11 (×2): 325 mg via ORAL
  Filled 2019-09-10 (×2): qty 1

## 2019-09-10 MED ORDER — FUROSEMIDE 20 MG PO TABS
20.0000 mg | ORAL_TABLET | ORAL | Status: DC
  Administered 2019-09-12: 20 mg via ORAL
  Filled 2019-09-10: qty 1

## 2019-09-10 MED ORDER — MORPHINE SULFATE (PF) 2 MG/ML IV SOLN: 2.0000 mg | INTRAVENOUS | Status: DC | PRN

## 2019-09-10 MED ORDER — ACETAMINOPHEN 325 MG PO TABS
650.0000 mg | ORAL_TABLET | ORAL | Status: DC | PRN
  Administered 2019-09-10 – 2019-09-11 (×2): 650 mg via ORAL
  Filled 2019-09-10 (×2): qty 2

## 2019-09-10 MED ORDER — CLOPIDOGREL BISULFATE 75 MG PO TABS
75.0000 mg | ORAL_TABLET | Freq: Every day | ORAL | Status: DC
  Administered 2019-09-10 – 2019-09-12 (×3): 75 mg via ORAL
  Filled 2019-09-10 (×3): qty 1

## 2019-09-10 MED ORDER — MUSCLE RUB 10-15 % EX CREA
TOPICAL_CREAM | CUTANEOUS | Status: DC | PRN
  Filled 2019-09-10: qty 85

## 2019-09-10 MED ORDER — NITROGLYCERIN IN D5W 200-5 MCG/ML-% IV SOLN
0.0000 ug/min | INTRAVENOUS | Status: DC
  Administered 2019-09-10: 5 ug/min via INTRAVENOUS
  Filled 2019-09-10: qty 250

## 2019-09-10 MED ORDER — LEVOTHYROXINE SODIUM 88 MCG PO TABS
88.0000 ug | ORAL_TABLET | Freq: Every day | ORAL | Status: DC
  Administered 2019-09-10 – 2019-09-12 (×3): 88 ug via ORAL
  Filled 2019-09-10 (×3): qty 1

## 2019-09-10 MED ORDER — ALUM & MAG HYDROXIDE-SIMETH 200-200-20 MG/5ML PO SUSP
30.0000 mL | Freq: Once | ORAL | Status: AC
  Administered 2019-09-10: 30 mL via ORAL
  Filled 2019-09-10: qty 30

## 2019-09-10 MED ORDER — AMLODIPINE BESYLATE 5 MG PO TABS
5.0000 mg | ORAL_TABLET | Freq: Every day | ORAL | Status: DC
  Administered 2019-09-10 – 2019-09-12 (×3): 5 mg via ORAL
  Filled 2019-09-10 (×3): qty 1

## 2019-09-10 MED ORDER — PANTOPRAZOLE SODIUM 40 MG IV SOLR
40.0000 mg | INTRAVENOUS | Status: DC
  Administered 2019-09-10 – 2019-09-11 (×3): 40 mg via INTRAVENOUS
  Filled 2019-09-10 (×3): qty 40

## 2019-09-10 MED ORDER — HEPARIN BOLUS VIA INFUSION
3000.0000 [IU] | Freq: Once | INTRAVENOUS | Status: AC
  Administered 2019-09-10: 3000 [IU] via INTRAVENOUS
  Filled 2019-09-10: qty 3000

## 2019-09-10 MED ORDER — ISOSORBIDE MONONITRATE ER 30 MG PO TB24
30.0000 mg | ORAL_TABLET | ORAL | Status: AC
  Administered 2019-09-10: 30 mg via ORAL
  Filled 2019-09-10: qty 1

## 2019-09-10 MED ORDER — METOPROLOL SUCCINATE ER 50 MG PO TB24
50.0000 mg | ORAL_TABLET | Freq: Every day | ORAL | Status: DC
  Administered 2019-09-10 – 2019-09-12 (×3): 50 mg via ORAL
  Filled 2019-09-10 (×3): qty 1

## 2019-09-10 MED ORDER — ESCITALOPRAM OXALATE 10 MG PO TABS
10.0000 mg | ORAL_TABLET | Freq: Every day | ORAL | Status: DC
  Administered 2019-09-10 – 2019-09-12 (×3): 10 mg via ORAL
  Filled 2019-09-10 (×3): qty 1

## 2019-09-10 MED ORDER — ALLOPURINOL 100 MG PO TABS
100.0000 mg | ORAL_TABLET | Freq: Every day | ORAL | Status: DC
  Administered 2019-09-10 – 2019-09-12 (×3): 100 mg via ORAL
  Filled 2019-09-10 (×3): qty 1

## 2019-09-10 MED ORDER — HEPARIN (PORCINE) 25000 UT/250ML-% IV SOLN
1000.0000 [IU]/h | INTRAVENOUS | Status: DC
  Administered 2019-09-10: 900 [IU]/h via INTRAVENOUS
  Administered 2019-09-11: 1000 [IU]/h via INTRAVENOUS
  Filled 2019-09-10 (×3): qty 250

## 2019-09-10 MED ORDER — ROSUVASTATIN CALCIUM 20 MG PO TABS
40.0000 mg | ORAL_TABLET | Freq: Every day | ORAL | Status: DC
  Administered 2019-09-10 – 2019-09-12 (×3): 40 mg via ORAL
  Filled 2019-09-10 (×3): qty 2

## 2019-09-10 MED ORDER — MORPHINE SULFATE (PF) 4 MG/ML IV SOLN: 4.0000 mg | INTRAVENOUS | Status: DC | PRN

## 2019-09-10 MED ORDER — LISINOPRIL 40 MG PO TABS
40.0000 mg | ORAL_TABLET | Freq: Every day | ORAL | Status: DC
  Administered 2019-09-10 – 2019-09-12 (×3): 40 mg via ORAL
  Filled 2019-09-10 (×3): qty 1

## 2019-09-10 MED ORDER — ONDANSETRON HCL 4 MG/2ML IJ SOLN: 4.0000 mg | Freq: Four times a day (QID) | INTRAMUSCULAR | Status: DC | PRN

## 2019-09-10 NOTE — Progress Notes (Signed)
followup visit from earlier consult today (refer to Dr Clide Cliff notes)  Doing well currently, without further angina. Given advanced age, minimal troponin elevation, and no substantial change by ekg, I would advise medical therapy.  If we can manage ischemic symptoms medically, perhaps cath can be avoided.  Wean IV nitro to off,  Increase imdur to 60mg  daily  Cardiology to follow  Ian Grayer MD, [place] 09/10/2019 10:36 AM

## 2019-09-10 NOTE — Progress Notes (Signed)
Patient has metoprolol listed as an allergy but denies.

## 2019-09-10 NOTE — Consult Note (Signed)
Cardiology Consult    Patient ID: Ian Cummings MRN: [id number], DOB/AGE: March 24, 1929   Admit date: 09/09/2019 Date of Consult: 09/10/2019  Primary Physician: Marton Redwood, MD Primary Cardiologist: Shirlee More, MD Requesting Provider: Dr. Inda Merlin MD  Patient Profile    [name] is a 86 y.o. male with a history of CAD s/p CABG, HFpEF, PAD w/ multiple prior revasc, and nonrheumatic moderate mitral regurgitation. He is being seen today for the evaluation of chest pain.  History of Present Illness    86 year old male with history of CAD s/p CABG 1996 & 2014, multiple prior caths and itnerventions, HFpEF, moderate Mr, hypertension, CKD3, PAD w/ prior R carotid stent, R fem-pop 2017, and CFA endarterectomy who presented with chest pain.  Chest pain began this morning at rest suddenly.  It is substernal, moderate, pressure-like, and nonradiating.  It was responsive to nitroglycerin and rest and provoked by activity.  He presented here for evaluation and was found to have reproducible chest pain with ambulation back from the waiting room.  He thinks pain is similar to prior MIs.  He is no longer having chest pain on low dose (5 mcg/kg/min of nitroglycerin)  Received ASA 325 mg, 10 mg IV hydralazine, BP 141/56 w/ HR in 60s on my evaluation.  TroponinI 17 --> 24  And chemistries, hematology otherwise unremarkable.  COVID negative.  Home CV meds Amlodipine 5 mg Aspirin 81 mg Clopidogrel 75 mg Furosemide 20 mg daily Isosorbide mononitrate 30 mg daily Lisinopril 40 mg daily Metoprolol succinate 50 mg daily Ranexa 500 mg BID Rosuvastatin 40 mg daily.  Past Medical History   Past Medical History:  Diagnosis Date  . AAA (abdominal aortic aneurysm) ([place])    2004, Pt stated not there now  . Anginal pain ([place])   . Anxiety   . Arthritis   . BPH (benign prostatic hypertrophy)   . CAD (coronary artery disease)   . Carotid artery disease ([place])    Right carotid stent 2013  .  Claudication ([place])   . Colon polyps    adenomatous  . Complex sleep apnea syndrome 11/11/2017  . Coronary artery disease   . Depression   . GERD (gastroesophageal reflux disease)   . Gout   . H/O hiatal hernia   . Hyperlipidemia   . Hypertension   . Hypothyroidism   . MI (myocardial infarction) ([place]) 4/14  . Neuropathy    Hx: of in B/L toes  . Peripheral vascular disease ([place])    with claudication  . Pre-diabetes   . Stroke ([place])   . TIA (transient ischemic attack)    Hx: of    Past Surgical History:  Procedure Laterality Date  . CAROTID STENT INSERTION Right 2013   right at [facility]  . CATARACT EXTRACTION Bilateral    Hx: of both eyes  . COLONOSCOPY     Hx: of  . COLONOSCOPY    . CORONARY ANGIOPLASTY WITH STENT PLACEMENT  2010  . CORONARY ARTERY BYPASS GRAFT  1995  . CORONARY ARTERY BYPASS GRAFT N/A 10/18/2012   Procedure: REDO CORONARY ARTERY BYPASS GRAFTING (CABG);  Surgeon: Grace Isaac, MD;  Location: [place];  Service: Open Heart Surgery;  Laterality: N/A;  off pump times two using endoscopically harvested left saphenous vein  . CORONARY STENT PLACEMENT  06/2016  . ENDARTERECTOMY FEMORAL Right 08/03/2015   Procedure: RIGHT FEMORAL ENDARTERECTOMY WITH PATCH ANGIOPLASTY;  Surgeon: Serafina Mitchell, MD;  Location: [facility];  Service: Vascular;  Laterality: Right;  . FEMORAL-POPLITEAL BYPASS GRAFT Right 08/03/2015   Procedure: RIGHT FEMORAL-BELOW KNEE POPLITEAL ARTERY BYPASS GRAFT;  Surgeon: Serafina Mitchell, MD;  Location: [place];  Service: Vascular;  Laterality: Right;  . FOOT SURGERY Left   . FRACTURE SURGERY Left    Hx: of Left heel  . INGUINAL HERNIA REPAIR Bilateral    X 2   . INTRAOPERATIVE TRANSESOPHAGEAL ECHOCARDIOGRAM N/A 10/18/2012   Procedure: INTRAOPERATIVE TRANSESOPHAGEAL ECHOCARDIOGRAM;  Surgeon: Grace Isaac, MD;  Location: [place];  Service: Open Heart Surgery;  Laterality: N/A;  . LEFT HEART CATHETERIZATION WITH CORONARY/GRAFT ANGIOGRAM N/A  02/02/2013   Procedure: LEFT HEART CATHETERIZATION WITH Beatrix Fetters;  Surgeon: Jacolyn Reedy, MD;  Location: [facility] CATH LAB;  Service: Cardiovascular;  Laterality: N/A;  . LOWER EXTREMITY ANGIOGRAM Right 06/27/2015   Procedure: Lower Extremity Angiogram;  Surgeon: Serafina Mitchell, MD;  Location: [place] CV LAB;  Service: Cardiovascular;  Laterality: Right;  . MASTOIDECTOMY  1933  . PERIPHERAL VASCULAR CATHETERIZATION N/A 01/23/2015   Procedure: Abdominal Aortogram;  Surgeon: Serafina Mitchell, MD;  Location: [place] CV LAB;  Service: Cardiovascular;  Laterality: N/A;  . PERIPHERAL VASCULAR CATHETERIZATION N/A 06/27/2015   Procedure: Abdominal Aortogram;  Surgeon: Serafina Mitchell, MD;  Location: [place] [place] CV LAB;  Service: Cardiovascular;  Laterality: N/A;  . RETINAL DETACHMENT SURGERY Left    Hx: of left eye  . TONSILLECTOMY       Allergies  Allergen Reactions  . Amoxicillin Rash  . Metoprolol Itching   Inpatient Medications    . allopurinol  100 mg Oral Daily  . amLODipine  5 mg Oral Daily  . aspirin EC  325 mg Oral Daily  . clopidogrel  75 mg Oral Daily  . escitalopram  10 mg Oral Daily  . [START ON 09/12/2019] furosemide  20 mg Oral Q M,W,F  . isosorbide mononitrate  30 mg Oral Daily  . levothyroxine  88 mcg Oral Q0600  . lisinopril  40 mg Oral Daily  . metoprolol succinate  50 mg Oral Daily  . pantoprazole (PROTONIX) IV  40 mg Intravenous Q24H  . rosuvastatin  40 mg Oral Daily  . sodium chloride flush  3 mL Intravenous Once    Family History    Family History  Problem Relation Age of Onset  . Heart disease Mother        After 70 yrs of age  . Heart attack Mother   . Hyperlipidemia Father   . Hypertension Father   . Heart disease Father        After 24 yrs of age  . Diabetes Father   . Stroke Sister   . Colon cancer Neg Hx   . Stomach cancer Neg Hx   . Rectal cancer Neg Hx   . Esophageal cancer Neg Hx   . Liver disease Neg Hx   . Kidney disease  Neg Hx    He indicated that his mother is deceased. He indicated that his father is deceased. He indicated that only one of his three sisters is alive. He indicated that his brother is alive. He indicated that the status of his neg hx is unknown.   Social History    Social History   Socioeconomic History  . Marital status: Married    Spouse name: Not on file  . Number of children: 3  . Years of education: Not on file  . Highest education level: Not on file  Occupational History  . Occupation: retired  Tobacco Use  . Smoking status: Former Smoker    Types: Cigarettes    Quit date: 04/29/1975    Years since quitting: 44.3  . Smokeless tobacco: Never Used  Substance and Sexual Activity  . Alcohol use: Yes    Alcohol/week: 3.0 standard drinks    Types: 3 Glasses of wine per week    Comment: 3 glasses of wine today  . Drug use: No  . Sexual activity: Not Currently  Other Topics Concern  . 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 Difficulty of Paying Living Expenses:   Food Insecurity:   . Worried About Charity fundraiser in the Last Year:   . Arboriculturist in the Last Year:   Transportation Needs:   . Film/video editor (Medical):   Marland Kitchen Lack of Transportation (Non-Medical):   Physical Activity:   . Days of Exercise per Week:   . Minutes of Exercise per Session:   Stress:   . Feeling of Stress :   Social Connections:   . Frequency of Communication with Friends and Family:   . Frequency of Social Gatherings with Friends and Family:   . Attends Religious Services:   . Active Member of Clubs or Organizations:   . Attends Archivist Meetings:   Marland Kitchen Marital Status:   Intimate Partner Violence:   . Fear of Current or Ex-Partner:   . Emotionally Abused:   Marland Kitchen Physically Abused:   . Sexually Abused:      Review of Systems    General:  No chills, fever, night  sweats or weight changes.  Cardiovascular:  No chest pain, dyspnea on exertion, edema, orthopnea, palpitations, paroxysmal nocturnal dyspnea. Dermatological: No rash, lesions/masses Respiratory: No cough, dyspnea Urologic: No hematuria, dysuria Abdominal:   No nausea, vomiting, diarrhea, bright red blood per rectum, melena, or hematemesis Neurologic:  No visual changes, wkns, changes in mental status. All other systems reviewed and are otherwise negative except as noted above.  Physical Exam    Blood pressure (!) 141/56, pulse 60, temperature 98.3 F (36.8 C), temperature source Oral, resp. rate 15, SpO2 98 %.    No intake or output data in the 24 hours ending 09/10/19 0157 Wt Readings from Last 3 Encounters:  04/12/19 71.2 kg  04/11/19 72.6 kg  02/01/19 71.8 kg    CONSTITUTIONAL: alert and conversant, well-appearing, nourished, no distress HEENT: oropharynx clear and moist, no mucosal lesions, normal dentition, conjunctiva normal, EOM intact, pupils equal, no lid lag. NECK: supple, no cervical adenopathy, no thyromegaly CARDIOVASCULAR: Regular rhythm. No gallop, murmur, or rub. Normal S1/S2. Radial pulses intact. JVP flat. No carotid bruits. PULMONARY/CHEST WALL: no deformities, normal breath sounds bilaterally, normal work of breathing ABDOMINAL: soft, non-tender, non-distended EXTREMITIES: 2+ bilateral radial, 2+ L DP/PT, 1+ DP/PT right NEUROLOGIC: alert, normal gait, no abnormal movements, cranial nerves grossly intact.   Labs   Reviewed TroponinI 17 --> 24 7.3>14.1<180 Normal chemistry aside from GFR 50  Radiology Studies    DG Chest 2 View  Result Date: 09/09/2019 CLINICAL DATA:  Chest pain, shortness of breath EXAM: CHEST - 2 VIEW COMPARISON:  01/12/2019 FINDINGS: There is hyperinflation of the lungs compatible with COPD. Prior CABG. Cardiomegaly. Bibasilar atelectasis or scarring. No effusions or acute bony abnormality. IMPRESSION: COPD/chronic changes. No active  disease. Electronically Signed   By:  Rolm Baptise M.D.   On: 09/09/2019 13:39    ECG & Cardiac Imaging    NSR with frequent PACs v. MAT.  Normal axis.  IRBBB pattern.  Lateral upsloping ST depressions.  TTE 01/17/19 1. Left ventricular ejection fraction, by visual estimation, is 55 to  60%. The left ventricle has normal function. Normal left ventricular size.  There is borderline left ventricular hypertrophy.  2. Left ventricular diastolic Doppler parameters are consistent with  impaired relaxation pattern of LV diastolic filling.  3. Global right ventricle has normal systolic function.The right  ventricular size is normal. No increase in right ventricular wall  thickness.  4. Left atrial size was moderately dilated.  5. Right atrial size was normal.  6. The mitral valve is normal in structure. Moderate to severe mitral  valve regurgitation. No evidence of mitral stenosis.  7. The tricuspid valve is normal in structure. Tricuspid valve  regurgitation was not visualized by color flow Doppler.  8. The aortic valve is normal in structure. Aortic valve regurgitation  was not visualized by color flow Doppler. Structurally normal aortic  valve, with no evidence of sclerosis or stenosis.  9. The pulmonic valve was normal in structure. Pulmonic valve  regurgitation is not visualized by color flow Doppler.  10. Mildly elevated pulmonary artery systolic pressure.  11. The inferior vena cava is normal in size with greater than 50%  respiratory variability, suggesting right atrial pressure of 3 mmHg.   Assessment & Plan    86 year old male with history of CAD s/p CABG 1996 & 2014, multiple prior caths and itnerventions, HFpEF, moderate Mr, hypertension, CKD3, PAD w/ prior R carotid stent, R fem-pop 2017, and CFA endarterectomy who presented with chest pain.  He has typical symptoms but near normal troponin well remote from symptoms is reassuring for ACS.  Nevertheless, given symptoms  reasonable to treat as acute coronary syndrome continuing aspirin and plavix with heparin infusion.  If he has any evidence of bleeding (high risk with age), I would have low threshold to stop.  Likely plan for cardiac catheterization on Monday as, given extent of prior disease, functional evaluation is unlikely to be informative.  He was interested in considering noninvasive strategy, however, so reassured him that we would adjust antianginals and see how he fares with medical management.  Recommendations - Continue ASA 81 mg & Plavix 75 mg daily - Continue heparin infusion, ACS nomogram - Continue rosuvastatin 40 mg daily - Nitroglycerin infusion for pain - Increase isordil to 60 mg daily and uptitrate as tolerated to liberate from nitroglycerin - Increase metoprolol succinate to 75 mg daily to aid with atrial ectopic burden - Can reduce lisinopril if needed to facilitate antianginal uptitration. - Trend troponin to peak. - Obtain echocardiogram  Signed, Delight Hoh, MD 09/10/2019, 1:57 AM  For questions or updates, please contact   Please consult [email or web address] for contact info under Cardiology/STEMI.

## 2019-09-10 NOTE — Plan of Care (Signed)

## 2019-09-10 NOTE — Progress Notes (Signed)
  Echocardiogram 2D Echocardiogram has been performed.  Ian Cummings 09/10/2019, 9:37 AM

## 2019-09-10 NOTE — Progress Notes (Signed)
Patient has completed his lunch, states he would like to rest/nap. Pt's wife departing from her visit to allow pt to rest. Door closed to accommodate pt request.

## 2019-09-10 NOTE — Progress Notes (Signed)
Pt refuses to wear CPAP.  °

## 2019-09-10 NOTE — Progress Notes (Signed)
PROGRESS NOTE  Ian Cummings [id number] DOB: July 13, 1928   PCP: Marton Redwood, MD  Patient is from: Home.  Lives with his wife.  DOA: 09/09/2019 LOS: 0  Brief Narrative / Interim history: 86 year old with significant vasculopathy-CAD s/p CABG in 1996 and 2014, and multiple stents with last one in 2018, PVD s/p rt carotid stent in 2013, rt fem-pop bypass in 2017 and FEA by Dr. Trula Slade, diastolic CHF, moderate MVR, HTN, HLD, CKD-3, OSA, gout and GERD presenting with chest discomfort mainly with activity that seems to improve with nitroglycerin.  In ED, slightly hypertensive.  98% on RA. Cr 1.29.  CBC normal. HS trop 17> 24.  CXR consistent with COPD. EKG afib at 102 and Q-wave in inferior leads.  BNP 284.  Received aspirin.  Also hydralazine for blood pressure.  Started on nitro drip and heparin drip and admitted for ACS evaluation.  Cardiology consulted.  Subjective: Seen and examined earlier this morning.  No major events this morning.  Chest pain resolved.  Denies dyspnea, palpitation, dizziness, GI or UTI symptoms.  Feels hungry.  Objective: Vitals:   09/10/19 0800 09/10/19 0900 09/10/19 1000 09/10/19 1100  BP: 139/67 (!) 152/65 (!) 121/56 (!) 140/54  Pulse: 66 67 (!) 51 61  Resp: 16 15 13 19   Temp:      TempSrc:      SpO2: 98% 100% 96% 98%  Weight:      Height:        Intake/Output Summary (Last 24 hours) at 09/10/2019 1329 Last data filed at 09/10/2019 1100 Gross per 24 hour  Intake 304.78 ml  Output 700 ml  Net -395.22 ml   Filed Weights   09/10/19 0242  Weight: 70.2 kg    Examination:  GENERAL: No apparent distress.  Nontoxic. HEENT: MMM.  Vision and hearing grossly intact.  NECK: Supple.  No apparent JVD.  RESP:  No IWOB.  Fair aeration bilaterally. CVS:  RRR. Heart sounds normal.  ABD/GI/GU: BS+. Abd soft, NTND.  MSK/EXT:  Moves extremities. No apparent deformity. No edema.  SKIN: no apparent skin lesion or wound NEURO: Sleepy but wakes to voice easily.   Oriented x4.  No apparent focal neuro deficit. PSYCH: Calm. Normal affect.  Procedures:  None  Microbiology summarized: COVID-19 PCR negative.  Assessment & Plan: Unstable angina in a patient with significant vasculopathy (CAD s/p CABG in 1996 and 2014, and multiple stents with last one in 2018, PVD s/p rt carotid stent in 2013, rt fem-pop bypass in 2017 and FEA)-EKG without acute ischemic finding.  Troponin elevation negligible (17> 24> 23).  Chest pain resolved.  Echo with LVEF of 50 to 55%, no RWMA, moderate LAE and moderate TVR -Cardiology following. -Transitioned off IV nitro to Imdur 60 mg daily -Continue IV heparin per cardiology -Continue ASA, Plavix, statin, metoprolol and lisinopril  Permanent atrial fibrillation with mild RVR: RVR resolved. -Metoprolol and heparin as above  CKD-3A: Stable -Continue monitoring  Chronic diastolic CHF: Appears euvolemic.  No cardiopulmonary symptoms today.  Echo reassuring as above. -On Lasix 20 MWF -Monitor fluid status and renal function closely  OSA -Try nightly CPAP  Hypothyroidism -Continue home Synthroid  Essential hypertension: BP slightly elevated. -Cardiac meds as above  Gout without flareup -Continue home meds            DVT prophylaxis: On heparin drip for unstable angina Code Status: DNR/DNI Family Communication: Patient and/or RN. Available if any question.   Status is: Inpatient.  Remains inpatient appropriate because: Patient  with unstable angina requiring IV heparin and close monitoring. May need heart catheterization if not resolved with medical management.   Dispo: The patient is from: Home              Anticipated d/c is to: Home              Anticipated d/c date is: 3 days              Patient currently is not medically stable to d/c.             Consultants:  Cardiology   Sch Meds:  Scheduled Meds: . allopurinol  100 mg Oral Daily  . amLODipine  5 mg Oral Daily  . aspirin EC   325 mg Oral Daily  . clopidogrel  75 mg Oral Daily  . escitalopram  10 mg Oral Daily  . [START ON 09/12/2019] furosemide  20 mg Oral Q M,W,F  . [START ON 09/11/2019] isosorbide mononitrate  60 mg Oral Daily  . levothyroxine  88 mcg Oral Q0600  . lisinopril  40 mg Oral Daily  . metoprolol succinate  50 mg Oral Daily  . pantoprazole (PROTONIX) IV  40 mg Intravenous Q24H  . rosuvastatin  40 mg Oral Daily  . sodium chloride flush  3 mL Intravenous Once   Continuous Infusions: . heparin 1,000 Units/hr (09/10/19 0910)   PRN Meds:.acetaminophen, morphine injection **OR** morphine injection, ondansetron (ZOFRAN) IV  Antimicrobials: Anti-infectives (From admission, onward)   None       I have personally reviewed the following labs and images: CBC: Recent Labs  Lab 09/09/19 1310 09/10/19 0228  WBC 7.3 7.0  NEUTROABS  --  4.6  HGB 14.1 13.6  HCT 42.6 40.5  MCV 97.3 95.7  PLT 180 190   BMP &GFR Recent Labs  Lab 09/09/19 1310 09/10/19 0228  NA 140 138  K 4.2 3.9  CL 106 106  CO2 25 23  GLUCOSE 93 93  BUN 17 21  CREATININE 1.29* 1.20  CALCIUM 9.4 9.4  MG  --  1.9   Estimated Creatinine Clearance: 42.3 mL/min (by C-G formula based on SCr of 1.2 mg/dL). Liver & Pancreas: Recent Labs  Lab 09/10/19 0228  AST 26  ALT 26  ALKPHOS 33*  BILITOT 0.6  PROT 7.4  ALBUMIN 3.4*   No results for input(s): LIPASE, AMYLASE in the last 168 hours. No results for input(s): AMMONIA in the last 168 hours. Diabetic: No results for input(s): HGBA1C in the last 72 hours. No results for input(s): GLUCAP in the last 168 hours. Cardiac Enzymes: No results for input(s): CKTOTAL, CKMB, CKMBINDEX, TROPONINI in the last 168 hours. No results for input(s): PROBNP in the last 8760 hours. Coagulation Profile: Recent Labs  Lab 09/10/19 0158  INR 1.1   Thyroid Function Tests: No results for input(s): TSH, T4TOTAL, FREET4, T3FREE, THYROIDAB in the last 72 hours. Lipid Profile: No results  for input(s): CHOL, HDL, LDLCALC, TRIG, CHOLHDL, LDLDIRECT in the last 72 hours. Anemia Panel: No results for input(s): VITAMINB12, FOLATE, FERRITIN, TIBC, IRON, RETICCTPCT in the last 72 hours. Urine analysis:    Component Value Date/Time   COLORURINE AMBER (A) 08/01/2015 1451   APPEARANCEUR CLEAR 08/01/2015 1451   LABSPEC 1.015 08/01/2015 1451   PHURINE 6.0 08/01/2015 1451   GLUCOSEU NEGATIVE 08/01/2015 1451   HGBUR NEGATIVE 08/01/2015 1451   BILIRUBINUR NEGATIVE 08/01/2015 1451   KETONESUR NEGATIVE 08/01/2015 1451   PROTEINUR NEGATIVE 08/01/2015 1451   UROBILINOGEN 1.0  10/15/2012 1014   NITRITE NEGATIVE 08/01/2015 1451   LEUKOCYTESUR NEGATIVE 08/01/2015 1451   Sepsis Labs: Invalid input(s): PROCALCITONIN, [place]  Microbiology: Recent Results (from the past 240 hour(s))  SARS Coronavirus 2 by RT PCR (hospital order, performed in [facility] hospital lab) Nasopharyngeal Nasopharyngeal Swab     Status: None   Collection Time: 09/09/19 11:18 PM   Specimen: Nasopharyngeal Swab  Result Value Ref Range Status   SARS Coronavirus 2 NEGATIVE NEGATIVE Final    Comment: (NOTE) SARS-CoV-2 target nucleic acids are NOT DETECTED. The SARS-CoV-2 RNA is generally detectable in upper and lower respiratory specimens during the acute phase of infection. The lowest concentration of SARS-CoV-2 viral copies this assay can detect is 250 copies / mL. A negative result does not preclude SARS-CoV-2 infection and should not be used as the sole basis for treatment or other patient management decisions.  A negative result may occur with improper specimen collection / handling, submission of specimen other than nasopharyngeal swab, presence of viral mutation(s) within the areas targeted by this assay, and inadequate number of viral copies (<250 copies / mL). A negative result must be combined with clinical observations, patient history, and epidemiological information. Fact Sheet for Patients:     StrictlyIdeas.no Fact Sheet for Healthcare Providers: BankingDealers.co.za This test is not yet approved or cleared  by the [place] FDA and has been authorized for detection and/or diagnosis of SARS-CoV-2 by FDA under an Emergency Use Authorization (EUA).  This EUA will remain in effect (meaning this test can be used) for the duration of the COVID-19 declaration under Section 564(b)(1) of the Act, 21 U.S.C. section 360bbb-3(b)(1), unless the authorization is terminated or revoked sooner. Performed at [facility], [place] [address], [place], [place] [address]     Radiology Studies: ECHOCARDIOGRAM COMPLETE  Result Date: 09/10/2019    ECHOCARDIOGRAM REPORT   Patient Name:   Ian Cummings Date of Exam: 09/10/2019 Medical Rec #:  [id number]     Height:       71.0 in Accession #:    [id number]    Weight:       154.7 lb Date of Birth:  1929-01-29    BSA:          1.891 m Patient Age:    37 years      BP:           143/59 mmHg Patient Gender: M             HR:           58 bpm. Exam Location:  Inpatient Procedure: 2D Echo, Cardiac Doppler and Color Doppler Indications:    Chest Pain 786.50/R07.9  History:        Patient has prior history of Echocardiogram examinations, most                 recent 01/17/2019. CAD, Prior CABG, Mitral Valve Disease,                 Arrythmias:Atrial Fibrillation and RBBB; Risk                 Factors:Dyslipidemia, Hypertension, Sleep Apnea and Former                 Smoker. CKD. Bradycardia.  Sonographer:    Clayton Lefort RDCS (AE) Referring Phys: [id number] [place]  1. Inferior basal hypokinesis . Left ventricular ejection fraction, by estimation, is 50 to 55%. The left ventricle has  low normal function. The left ventricle has no regional wall motion abnormalities. Left ventricular diastolic parameters were normal.  2. Right ventricular systolic function is normal. The right ventricular size is normal.  There is mildly elevated pulmonary artery systolic pressure.  3. Left atrial size was moderately dilated.  4. The mitral valve is degenerative. Mild mitral valve regurgitation. No evidence of mitral stenosis.  5. Tricuspid valve regurgitation is moderate.  6. The aortic valve is tricuspid. Aortic valve regurgitation is not visualized. Mild aortic valve stenosis.  7. The inferior vena cava is normal in size with greater than 50% respiratory variability, suggesting right atrial pressure of 3 mmHg. FINDINGS  Left Ventricle: Inferior basal hypokinesis. Left ventricular ejection fraction, by estimation, is 50 to 55%. The left ventricle has low normal function. The left ventricle has no regional wall motion abnormalities. The left ventricular internal cavity size was normal in size. There is no left ventricular hypertrophy. Left ventricular diastolic parameters were normal. Right Ventricle: The right ventricular size is normal. No increase in right ventricular wall thickness. Right ventricular systolic function is normal. There is mildly elevated pulmonary artery systolic pressure. The tricuspid regurgitant velocity is 2.90  m/s, and with an assumed right atrial pressure of 8 mmHg, the estimated right ventricular systolic pressure is [id number] mmHg. Left Atrium: Left atrial size was moderately dilated. Right Atrium: Right atrial size was normal in size. Pericardium: There is no evidence of pericardial effusion. Mitral Valve: The mitral valve is degenerative in appearance. There is moderate thickening of the mitral valve leaflet(s). There is moderate calcification of the mitral valve leaflet(s). Normal mobility of the mitral valve leaflets. Mild mitral annular calcification. Mild mitral valve regurgitation. No evidence of mitral valve stenosis. MV peak gradient, 7.5 mmHg. The mean mitral valve gradient is 2.0 mmHg. Tricuspid Valve: The tricuspid valve is normal in structure. Tricuspid valve regurgitation is moderate . No  evidence of tricuspid stenosis. Aortic Valve: The aortic valve is tricuspid. . There is moderate thickening and moderate calcification of the aortic valve. Aortic valve regurgitation is not visualized. Mild aortic stenosis is present. There is moderate thickening of the aortic valve. There is moderate calcification of the aortic valve. Aortic valve mean gradient measures 8.7 mmHg. Aortic valve peak gradient measures 16.3 mmHg. Aortic valve area, by VTI measures 1.75 cm. Pulmonic Valve: The pulmonic valve was normal in structure. Pulmonic valve regurgitation is not visualized. No evidence of pulmonic stenosis. Aorta: The aortic root is normal in size and structure. Venous: The inferior vena cava is normal in size with greater than 50% respiratory variability, suggesting right atrial pressure of 3 mmHg. IAS/Shunts: No atrial level shunt detected by color flow Doppler.  LEFT VENTRICLE PLAX 2D LVIDd:         5.20 cm LVIDs:         3.80 cm LV PW:         1.50 cm LV IVS:        1.40 cm LVOT diam:     2.10 cm LV SV:         82 LV SV Index:   43 LVOT Area:     3.46 cm  RIGHT VENTRICLE            IVC RV Basal diam:  3.20 cm    IVC diam: 1.30 cm RV S prime:     7.42 cm/s TAPSE (M-mode): 1.3 cm LEFT ATRIUM              Index  RIGHT ATRIUM           Index LA diam:        4.70 cm  2.49 cm/m  RA Area:     21.70 cm LA Vol (A2C):   96.1 ml  50.83 ml/m RA Volume:   54.30 ml  28.72 ml/m LA Vol (A4C):   100.0 ml 52.90 ml/m LA Biplane Vol: 98.2 ml  51.94 ml/m  AORTIC VALVE AV Area (Vmax):    1.63 cm AV Area (Vmean):   1.64 cm AV Area (VTI):     1.75 cm AV Vmax:           202.00 cm/s AV Vmean:          136.333 cm/s AV VTI:            0.469 m AV Peak Grad:      16.3 mmHg AV Mean Grad:      8.7 mmHg LVOT Vmax:         95.10 cm/s LVOT Vmean:        64.400 cm/s LVOT VTI:          0.237 m LVOT/AV VTI ratio: 0.50  AORTA Ao Root diam: 3.30 cm Ao Asc diam:  3.20 cm MITRAL VALVE              TRICUSPID VALVE MV Area (PHT): 2.27  cm   TR Peak grad:   33.6 mmHg MV Peak grad:  7.5 mmHg   TR Vmax:        290.00 cm/s MV Mean grad:  2.0 mmHg MV Vmax:       1.37 m/s   SHUNTS MV Vmean:      69.5 cm/s  Systemic VTI:  0.24 m MR Peak grad: 168.0 mmHg  Systemic Diam: 2.10 cm MR Mean grad: 101.0 mmHg MR Vmax:      648.00 cm/s MR Vmean:     469.0 cm/s Jenkins Rouge MD Electronically signed by Jenkins Rouge MD Signature Date/Time: 09/10/2019/10:23:42 AM    Final    45 minutes with more than 50% spent in reviewing records, counseling patient/family and coordinating care.   Durene Dodge T. [place]  If 7PM-7AM, please contact night-coverage [email or web address] Password [facility] 09/10/2019, 1:29 PM

## 2019-09-10 NOTE — H&P (Signed)
History and Physical    DRUE HARR ION:[id number] DOB: Sep 23, 1928 DOA: 09/09/2019  PCP: Marton Redwood, MD  Patient coming from: Home   Chief Complaint:  Chief Complaint  Patient presents with  . Chest Pain     HPI:    86 year old male with past medical history of coronary artery disease (S/P CABG in 1996 and 2014, multiple cardiac caths with multiple stents placed last being in 06/2016 in [place] follows with Dr. Bettina Gavia), diastolic congestive heart failure, moderate mitral regurgitation, hypertension, hyperlipidemia, gout, chronic kidney disease stage III, gastroesophageal reflux disease, obstructive sleep apnea, peripheral vascular disease (right carotid stent 2013, right fem-pop bypass in 2017, common femoral/profunda femoral endarectomy follows with Dr. Trula Slade) who presents to [facility] emergency department complaints of chest discomfort.  Patient explains that this morning while sitting at home he suddenly began to experience chest discomfort.  Patient describes the chest discomfort as located in the mid to left parasternal area, mild to moderate intensity, pressure-like in quality and nonradiating.  This chest discomfort persisted for approximately 2 hours until it improved after he took 2 nitroglycerin.  Patient denies shortness of breath, diaphoresis, cough, palpitations or lightheadedness.  Patient then presented to [facility] emergency department for evaluation.  While the patient was in the waiting room he was chest pain-free but when he got up to walk and be taken back to the emergency department he suddenly began to develop the exact same chest discomfort with this exertion.  Upon further questioning, he describes that this chest discomfort is exactly like his previous episodes of myocardial infarction.  In the emergency department, aspirin was administered.  Hydralazine was additionally administered due to hypertension.  The hospitalist group was then  called to assess the patient for admission to the hospital.  Review of Systems: A 10-system review of systems has been performed and all systems are negative with the exception of what is listed in the HPI.    Past Medical History:  Diagnosis Date  . AAA (abdominal aortic aneurysm) ([place])    2004, Pt stated not there now  . Anginal pain ([place])   . Anxiety   . Arthritis   . BPH (benign prostatic hypertrophy)   . CAD (coronary artery disease)   . Carotid artery disease ([place])    Right carotid stent 2013  . Claudication ([place])   . Colon polyps    adenomatous  . Complex sleep apnea syndrome 11/11/2017  . Coronary artery disease   . Depression   . GERD (gastroesophageal reflux disease)   . Gout   . H/O hiatal hernia   . Hyperlipidemia   . Hypertension   . Hypothyroidism   . MI (myocardial infarction) ([place]) 4/14  . Neuropathy    Hx: of in B/L toes  . Peripheral vascular disease ([place])    with claudication  . Pre-diabetes   . Stroke ([place])   . TIA (transient ischemic attack)    Hx: of    Past Surgical History:  Procedure Laterality Date  . CAROTID STENT INSERTION Right 2013   right at [facility]  . CATARACT EXTRACTION Bilateral    Hx: of both eyes  . COLONOSCOPY     Hx: of  . COLONOSCOPY    . CORONARY ANGIOPLASTY WITH STENT PLACEMENT  2010  . CORONARY ARTERY BYPASS GRAFT  1995  . CORONARY ARTERY BYPASS GRAFT N/A 10/18/2012   Procedure: REDO CORONARY ARTERY BYPASS GRAFTING (CABG);  Surgeon: Grace Isaac, MD;  Location:  [place] OR;  Service: Open Heart Surgery;  Laterality: N/A;  off pump times two using endoscopically harvested left saphenous vein  . CORONARY STENT PLACEMENT  06/2016  . ENDARTERECTOMY FEMORAL Right 08/03/2015   Procedure: RIGHT FEMORAL ENDARTERECTOMY WITH PATCH ANGIOPLASTY;  Surgeon: Serafina Mitchell, MD;  Location: [place];  Service: Vascular;  Laterality: Right;  . FEMORAL-POPLITEAL BYPASS GRAFT Right 08/03/2015   Procedure: RIGHT FEMORAL-BELOW KNEE POPLITEAL  ARTERY BYPASS GRAFT;  Surgeon: Serafina Mitchell, MD;  Location: [place];  Service: Vascular;  Laterality: Right;  . FOOT SURGERY Left   . FRACTURE SURGERY Left    Hx: of Left heel  . INGUINAL HERNIA REPAIR Bilateral    X 2   . INTRAOPERATIVE TRANSESOPHAGEAL ECHOCARDIOGRAM N/A 10/18/2012   Procedure: INTRAOPERATIVE TRANSESOPHAGEAL ECHOCARDIOGRAM;  Surgeon: Grace Isaac, MD;  Location: [place];  Service: Open Heart Surgery;  Laterality: N/A;  . LEFT HEART CATHETERIZATION WITH CORONARY/GRAFT ANGIOGRAM N/A 02/02/2013   Procedure: LEFT HEART CATHETERIZATION WITH Beatrix Fetters;  Surgeon: Jacolyn Reedy, MD;  Location: [facility] CATH LAB;  Service: Cardiovascular;  Laterality: N/A;  . LOWER EXTREMITY ANGIOGRAM Right 06/27/2015   Procedure: Lower Extremity Angiogram;  Surgeon: Serafina Mitchell, MD;  Location: [place] CV LAB;  Service: Cardiovascular;  Laterality: Right;  . MASTOIDECTOMY  1933  . PERIPHERAL VASCULAR CATHETERIZATION N/A 01/23/2015   Procedure: Abdominal Aortogram;  Surgeon: Serafina Mitchell, MD;  Location: [place] CV LAB;  Service: Cardiovascular;  Laterality: N/A;  . PERIPHERAL VASCULAR CATHETERIZATION N/A 06/27/2015   Procedure: Abdominal Aortogram;  Surgeon: Serafina Mitchell, MD;  Location: [place] CV LAB;  Service: Cardiovascular;  Laterality: N/A;  . RETINAL DETACHMENT SURGERY Left    Hx: of left eye  . TONSILLECTOMY       reports that he quit smoking about 44 years ago. His smoking use included cigarettes. He has never used smokeless tobacco. He reports current alcohol use of about 3.0 standard drinks of alcohol per week. He reports that he does not use drugs.  Allergies  Allergen Reactions  . Amoxicillin Rash  . Metoprolol Itching    Family History  Problem Relation Age of Onset  . Heart disease Mother        After 63 yrs of age  . Heart attack Mother   . Hyperlipidemia Father   . Hypertension Father   . Heart disease Father        After 45 yrs of age  .  Diabetes Father   . Stroke Sister   . Colon cancer Neg Hx   . Stomach cancer Neg Hx   . Rectal cancer Neg Hx   . Esophageal cancer Neg Hx   . Liver disease Neg Hx   . Kidney disease Neg Hx      Prior to Admission medications   Medication Sig Start Date End Date Taking? Authorizing Provider  allopurinol (ZYLOPRIM) 100 MG tablet Take 100 mg by mouth daily.    Yes [provider]  amLODipine (NORVASC) 5 MG tablet Take 5 mg by mouth daily.  05/12/07  Yes [provider]  aspirin EC 81 MG tablet Take 81 mg by mouth daily.    Yes [provider]  clopidogrel (PLAVIX) 75 MG tablet Take 75 mg by mouth daily.   Yes [provider]  escitalopram (LEXAPRO) 10 MG tablet Take 10 mg by mouth daily. 07/17/18  Yes [provider]  furosemide (LASIX) 20 MG tablet TAKE 1 TABLET BY MOUTH  EVERY DAY Patient taking differently: Take 20 mg by mouth every Monday, Wednesday, and Friday.  02/07/19  Yes Richardo Priest, MD  isosorbide mononitrate (IMDUR) 30 MG 24 hr tablet Take 30 mg by mouth daily.  07/31/16  Yes [provider]  lansoprazole (PREVACID) 30 MG capsule Take 30 mg by mouth daily. 08/16/19  Yes [provider]  levothyroxine (SYNTHROID, LEVOTHROID) 88 MCG tablet Take 88 mcg by mouth daily before breakfast.   Yes [provider]  lisinopril (ZESTRIL) 40 MG tablet Take 40 mg by mouth daily.  03/31/19  Yes [provider]  metoprolol succinate (TOPROL-XL) 50 MG 24 hr tablet Take 50 mg by mouth daily.   Yes [provider]  multivitamin (RENA-VIT) TABS tablet Take 1 tablet by mouth daily.   Yes [provider]  RANEXA 500 MG 12 hr tablet Take 500 mg by mouth 2 (two) times daily.  08/08/16  Yes [provider]  rosuvastatin (CRESTOR) 40 MG tablet Take 40 mg by mouth daily.   Yes [provider]  ACCU-CHEK AVIVA PLUS test strip  11/17/18   [provider]  Blood Glucose Monitoring Suppl  (ACCU-CHEK AVIVA PLUS) w/Device KIT  11/17/18   [provider]  furosemide (LASIX) 20 MG tablet Take 1 tablet (20 mg total) by mouth daily. Patient not taking: Reported on 09/09/2019 02/01/19 09/08/28  Berniece Salines, DO    Physical Exam: Vitals:   09/09/19 2300 09/09/19 2315 09/09/19 2330 09/09/19 2345  BP: (!) 179/71 (!) 181/70 (!) 146/60 (!) 153/61  Pulse: 61 (!) 58 60 62  Resp: 13 10 12 14   Temp:      TempSrc:      SpO2: 100% 99% 99% 98%    Constitutional: Acute alert and oriented x3, patient is in distress due to ongoing chest discomfort. Skin: no rashes, no lesions, good skin turgor noted. Eyes: Pupils are equally reactive to light.  No evidence of scleral icterus or conjunctival pallor.  ENMT: Moist mucous membranes noted.  Posterior pharynx clear of any exudate or lesions.   Neck: normal, supple, no masses, no thyromegaly.  No evidence of jugular venous distension.   Respiratory: clear to auscultation bilaterally, no wheezing, no crackles. Normal respiratory effort. No accessory muscle use.  Cardiovascular: Regular rate and rhythm, 2/6 systolic murmur heard over the left sternal border as well as the mitral region.  No extremity edema. 2+ pedal pulses. No carotid bruits.  Chest:   Nontender without crepitus or deformity.   Back:   Nontender without crepitus or deformity. Abdomen: Abdomen is soft and nontender.  No evidence of intra-abdominal masses.  Positive bowel sounds noted in all quadrants.   Musculoskeletal: No joint deformity upper and lower extremities. Good ROM, no contractures. Normal muscle tone.  Neurologic: CN 2-12 grossly intact. Sensation intact, strength noted to be 5 out of 5 in all 4 extremities.  Patient is following all commands.  Patient is responsive to verbal stimuli.   Psychiatric: Patient presents as a normal mood with appropriate affect.  Patient seems to possess insight as to theircurrent situation.     Labs on Admission: I have personally  reviewed following labs and imaging studies -   CBC: Recent Labs  Lab 09/09/19 1310  WBC 7.3  HGB 14.1  HCT 42.6  MCV 97.3  PLT 485   Basic Metabolic Panel: Recent Labs  Lab 09/09/19 1310  NA 140  K 4.2  CL 106  CO2 25  GLUCOSE 93  BUN 17  CREATININE 1.29*  CALCIUM 9.4   GFR: CrCl cannot be calculated (Unknown ideal weight.). Liver Function Tests: No results for input(s): AST, ALT, ALKPHOS, BILITOT, PROT, ALBUMIN in the last 168 hours. No results for input(s): LIPASE, AMYLASE in the last 168 hours. No results for input(s): AMMONIA in the last 168 hours. Coagulation Profile: No results for input(s): INR, PROTIME in the last 168 hours. Cardiac Enzymes: No results for input(s): CKTOTAL, CKMB, CKMBINDEX, TROPONINI in the last 168 hours. BNP (last 3 results) No results for input(s): PROBNP in the last 8760 hours. HbA1C: No results for input(s): HGBA1C in the last 72 hours. CBG: No results for input(s): GLUCAP in the last 168 hours. Lipid Profile: No results for input(s): CHOL, HDL, LDLCALC, TRIG, CHOLHDL, LDLDIRECT in the last 72 hours. Thyroid Function Tests: No results for input(s): TSH, T4TOTAL, FREET4, T3FREE, THYROIDAB in the last 72 hours. Anemia Panel: No results for input(s): VITAMINB12, FOLATE, FERRITIN, TIBC, IRON, RETICCTPCT in the last 72 hours. Urine analysis:    Component Value Date/Time   COLORURINE AMBER (A) 08/01/2015 1451   APPEARANCEUR CLEAR 08/01/2015 1451   LABSPEC 1.015 08/01/2015 1451   PHURINE 6.0 08/01/2015 1451   GLUCOSEU NEGATIVE 08/01/2015 1451   HGBUR NEGATIVE 08/01/2015 1451   BILIRUBINUR NEGATIVE 08/01/2015 1451   KETONESUR NEGATIVE 08/01/2015 1451   PROTEINUR NEGATIVE 08/01/2015 1451   UROBILINOGEN 1.0 10/15/2012 1014   NITRITE NEGATIVE 08/01/2015 1451   LEUKOCYTESUR NEGATIVE 08/01/2015 1451    Radiological Exams on Admission - Personally Reviewed: DG Chest 2 View  Result Date: 09/09/2019 CLINICAL DATA:  Chest pain,  shortness of breath EXAM: CHEST - 2 VIEW COMPARISON:  01/12/2019 FINDINGS: There is hyperinflation of the lungs compatible with COPD. Prior CABG. Cardiomegaly. Bibasilar atelectasis or scarring. No effusions or acute bony abnormality. IMPRESSION: COPD/chronic changes. No active disease. Electronically Signed   By: Rolm Baptise M.D.   On: 09/09/2019 13:39    EKG: Personally reviewed.  Rhythm is atrial fibrillation and rapid ventricular response with heart rate of 102 bpm.  No dynamic ST segment changes appreciated.  Assessment/Plan Principal Problem:   Coronary artery disease involving native heart with unstable angina pectoris [facility])   Patient presenting with concerning chest discomfort which has recurred with exertion in the waiting room.  Patient states that his symptoms are similar to prior myocardial infarction in the past  Furthermore, patient has an extensive history of known coronary artery disease.  Considering patient's known history of extensive disease, patient is considered to be suffering from unstable angina.  Patient will be placed on nitroglycerin infusion which will be titrated to achieve relief of chest discomfort  Patient will be placed on heparin infusion  Will be made n.p.o. in anticipation of possible intervention later this morning  Continuing statin therapy and beta-blocker therapy  As needed intravenous morphine for refractory chest pain  Case discussed with Dr.Nipp the overnight cardiology fellow who agrees with management and has agreed to come consult on the patient this evening.  Patient is being admitted to the stepdown unit.  Active Problems:   Mixed hyperlipidemia   Continuing statin therapy as mentioned above    Atrial fibrillation with rapid ventricular response ([place])   Patient found to be in rapid atrial fibrillation per my review of initial EKG  Has a history of intermittent atrial fibrillation in the past  Patient is already receiving AV  nodal blocking agents and is already on a heparin infusion.  Patient has already spontaneously converted  back to normal sinus rhythm  Continuing to monitor patient on telemetry.    Hypothyroidism  Continue Synthroid    Essential hypertension   Continue remainder of home antihypertensive therapy as blood pressure tolerates    Chronic kidney disease, stage 3a   Creatinine near baseline  Strict input and output monitoring  Monitoring renal function and electrolytes with serial chemistries    Chronic diastolic (congestive) heart failure (HCC)   Based on echocardiogram September 2020 with preserved ejection fraction  No evidence of cardiogenic volume overload at this time   Code Status:  DNR Family Communication: Wife has been called via phone and updated on plan of care.  Status is: Observation  The patient remains OBS appropriate and will d/c before 2 midnights.  Dispo: The patient is from: Home              Anticipated d/c is to: Home              Anticipated d/c date is: 2 days              Patient currently is not medically stable to d/c.        Vernelle Emerald MD [facility] Pager [phone]  If 7PM-7AM, please contact night-coverage [email or web address] Use universal [place] password for that web site. If you do not have the password, please call the hospital operator.  09/10/2019, 1:00 AM

## 2019-09-10 NOTE — Progress Notes (Signed)
ANTICOAGULATION CONSULT NOTE - [place] for heparin Indication: chest pain/ACS  Allergies  Allergen Reactions  . Amoxicillin Rash  . Metoprolol Itching    Patient Measurements: Height: 5\' 11"  (180.3 cm) Weight: 70.2 kg (154 lb 11.2 oz) IBW/kg (Calculated) : 75.3 Heparin Dosing Weight: 70.2 kg  Vital Signs: Temp: 97.7 F (36.5 C) (05/15 0717) Temp Source: Oral (05/15 0717) BP: 143/59 (05/15 0717) Pulse Rate: 58 (05/15 0717)  Labs: Recent Labs    09/09/19 1310 09/09/19 2031 09/10/19 0158 09/10/19 0228 09/10/19 0746  HGB 14.1  --   --  13.6  --   HCT 42.6  --   --  40.5  --   PLT 180  --   --  190  --   APTT  --   --  >200*  --   --   LABPROT  --   --  13.5  --   --   INR  --   --  1.1  --   --   HEPARINUNFRC  --   --   --   --  0.28*  CREATININE 1.29*  --   --  1.20  --   TROPONINIHS 17 24* 23*  --   --     Estimated Creatinine Clearance: 42.3 mL/min (by C-G formula based on SCr of 1.2 mg/dL).   Medical History: Past Medical History:  Diagnosis Date  . AAA (abdominal aortic aneurysm) ([place])    2004, Pt stated not there now  . Anginal pain ([place])   . Anxiety   . Arthritis   . BPH (benign prostatic hypertrophy)   . CAD (coronary artery disease)   . Carotid artery disease ([place])    Right carotid stent 2013  . Claudication ([place])   . Colon polyps    adenomatous  . Complex sleep apnea syndrome 11/11/2017  . Coronary artery disease   . Depression   . GERD (gastroesophageal reflux disease)   . Gout   . H/O hiatal hernia   . Hyperlipidemia   . Hypertension   . Hypothyroidism   . MI (myocardial infarction) ([place]) 4/14  . Neuropathy    Hx: of in B/L toes  . Peripheral vascular disease ([place])    with claudication  . Pre-diabetes   . Stroke ([place])   . TIA (transient ischemic attack)    Hx: of    Assessment: 86 yo male w/ hx of CAD s/p CABG in 1996 & 2004 presenting with CP to start IV heparin. No anticoagulation noted PTA. Likely  planning for cardiac cath on Monday.  Heparin level just below goal on drip rate 900 units/hr. No infusion issues or overt bleeding per RN. CBC stable and wnl. Will increase infusion rate slightly.   Goal of Therapy:  Heparin level 0.3-0.7 units/ml Monitor platelets by anticoagulation protocol: Yes   Plan:   Increase heparin infusion to 1000 units/hr Check HL with AM labs Monitor daily HL, CBC, s/sx bleeding  Richardine Service, PharmD PGY1 Pharmacy Resident Phone: [phone] 09/10/2019  9:07 AM  Please check [email or web address] for unit-specific pharmacy phone numbers.

## 2019-09-10 NOTE — Progress Notes (Signed)
Report received on patient, pt denies complaints at time of shift report. Resting C/a/ox4 in the bed.

## 2019-09-10 NOTE — Progress Notes (Signed)
ANTICOAGULATION CONSULT NOTE - Initial Consult  Pharmacy Consult for heparin Indication: chest pain/ACS  Allergies  Allergen Reactions  . Amoxicillin Rash  . Metoprolol Itching    Patient Measurements:   Heparin Dosing Weight: ~ 150 lb  Vital Signs: Temp: 98.3 F (36.8 C) (05/14 1847) Temp Source: Oral (05/14 1847) BP: 153/61 (05/14 2345) Pulse Rate: 62 (05/14 2345)  Labs: Recent Labs    09/09/19 1310 09/09/19 2031  HGB 14.1  --   HCT 42.6  --   PLT 180  --   CREATININE 1.29*  --   TROPONINIHS 17 24*    CrCl cannot be calculated (Unknown ideal weight.).   Medical History: Past Medical History:  Diagnosis Date  . AAA (abdominal aortic aneurysm) ([place])    2004, Pt stated not there now  . Anginal pain ([place])   . Anxiety   . Arthritis   . BPH (benign prostatic hypertrophy)   . CAD (coronary artery disease)   . Carotid artery disease ([place])    Right carotid stent 2013  . Claudication ([place])   . Colon polyps    adenomatous  . Complex sleep apnea syndrome 11/11/2017  . Coronary artery disease   . Depression   . GERD (gastroesophageal reflux disease)   . Gout   . H/O hiatal hernia   . Hyperlipidemia   . Hypertension   . Hypothyroidism   . MI (myocardial infarction) ([place]) 4/14  . Neuropathy    Hx: of in B/L toes  . Peripheral vascular disease ([place])    with claudication  . Pre-diabetes   . Stroke ([place])   . TIA (transient ischemic attack)    Hx: of    Medications:  See med history  Assessment: 86 yo man to start heparin for CP.  He was not on anticoagulation PTA.  Hg 14.1, PTLC 180 Goal of Therapy:  Heparin level 0.3-0.7 units/ml Monitor platelets by anticoagulation protocol: Yes   Plan:  Heparin bolus 3000 units and drip at 900 units/hr Check heparin level 6-8 hours after start then daily while on heparin Monitor for bleeding complications  Thanks for allowing pharmacy to be a part of this patient's care.  Excell Seltzer, PharmD Clinical  Pharmacist 09/10/2019,12:38 AM

## 2019-09-10 NOTE — Plan of Care (Signed)
  Problem: Education: Goal: Knowledge of General Education information will improve Description Including pain rating scale, medication(s)/side effects and non-pharmacologic comfort measures Outcome: Progressing   

## 2019-09-11 DIAGNOSIS — I25110 Atherosclerotic heart disease of native coronary artery with unstable angina pectoris: Principal | ICD-10-CM

## 2019-09-11 DIAGNOSIS — I110 Hypertensive heart disease with heart failure: Secondary | ICD-10-CM

## 2019-09-11 DIAGNOSIS — I5032 Chronic diastolic (congestive) heart failure: Secondary | ICD-10-CM

## 2019-09-11 LAB — RENAL FUNCTION PANEL
Albumin: 3 g/dL — ABNORMAL LOW (ref 3.5–5.0)
Anion gap: 8 (ref 5–15)
BUN: 21 mg/dL (ref 8–23)
CO2: 25 mmol/L (ref 22–32)
Calcium: 9.2 mg/dL (ref 8.9–10.3)
Chloride: 108 mmol/L (ref 98–111)
Creatinine, Ser: 1.3 mg/dL — ABNORMAL HIGH (ref 0.61–1.24)
GFR calc Af Amer: 56 mL/min — ABNORMAL LOW (ref >60)
GFR calc non Af Amer: 49 mL/min — ABNORMAL LOW (ref >60)
Glucose, Bld: 113 mg/dL — ABNORMAL HIGH (ref 70–99)
Phosphorus: 3.9 mg/dL (ref 2.5–4.6)
Potassium: 4.3 mmol/L (ref 3.5–5.1)
Sodium: 141 mmol/L (ref 135–145)

## 2019-09-11 LAB — HEPARIN LEVEL (UNFRACTIONATED): Heparin Unfractionated: 0.38 IU/mL (ref 0.30–0.70)

## 2019-09-11 LAB — CBC
HCT: 37.1 % — ABNORMAL LOW (ref 39.0–52.0)
Hemoglobin: 12.2 g/dL — ABNORMAL LOW (ref 13.0–17.0)
MCH: 32.1 pg (ref 26.0–34.0)
MCHC: 32.9 g/dL (ref 30.0–36.0)
MCV: 97.6 fL (ref 80.0–100.0)
Platelets: 150 10*3/uL (ref 150–400)
RBC: 3.8 MIL/uL — ABNORMAL LOW (ref 4.22–5.81)
RDW: 13.8 % (ref 11.5–15.5)
WBC: 5.8 10*3/uL (ref 4.0–10.5)
nRBC: 0 % (ref 0.0–0.2)

## 2019-09-11 LAB — MAGNESIUM: Magnesium: 1.9 mg/dL (ref 1.7–2.4)

## 2019-09-11 MED ORDER — ASPIRIN 81 MG PO CHEW
81.0000 mg | CHEWABLE_TABLET | Freq: Every day | ORAL | Status: DC
  Administered 2019-09-12: 81 mg via ORAL
  Filled 2019-09-11: qty 1

## 2019-09-11 MED ORDER — SENNOSIDES-DOCUSATE SODIUM 8.6-50 MG PO TABS
1.0000 | ORAL_TABLET | Freq: Two times a day (BID) | ORAL | Status: DC | PRN
  Administered 2019-09-11: 1 via ORAL
  Filled 2019-09-11: qty 1

## 2019-09-11 MED ORDER — POLYETHYLENE GLYCOL 3350 17 G PO PACK
17.0000 g | PACK | Freq: Two times a day (BID) | ORAL | Status: DC | PRN
  Administered 2019-09-11: 17 g via ORAL
  Filled 2019-09-11: qty 1

## 2019-09-11 MED ORDER — ASPIRIN 81 MG PO CHEW: 81.0000 mg | CHEWABLE_TABLET | Freq: Every day | ORAL | Status: DC

## 2019-09-11 NOTE — Progress Notes (Signed)
ANTICOAGULATION CONSULT NOTE - [place] for heparin Indication: chest pain/ACS  Allergies  Allergen Reactions  . Amoxicillin Rash  . Metoprolol Itching    Patient Measurements: Height: 5\' 11"  (180.3 cm) Weight: 70.3 kg (155 lb)(scale b) IBW/kg (Calculated) : 75.3 Heparin Dosing Weight: 70.2 kg  Vital Signs: Temp: 97.6 F (36.4 C) (05/16 0516) Temp Source: Oral (05/16 0516) BP: 128/53 (05/16 0709) Pulse Rate: 57 (05/16 0709)  Labs: Recent Labs    09/09/19 1310 09/09/19 1310 09/09/19 2031 09/10/19 0158 09/10/19 0228 09/10/19 0746 09/11/19 0319  HGB 14.1   < >  --   --  13.6  --  12.2*  HCT 42.6  --   --   --  40.5  --  37.1*  PLT 180  --   --   --  190  --  150  APTT  --   --   --  >200*  --   --   --   LABPROT  --   --   --  13.5  --   --   --   INR  --   --   --  1.1  --   --   --   HEPARINUNFRC  --   --   --   --   --  0.28* 0.38  CREATININE 1.29*  --   --   --  1.20  --  1.30*  TROPONINIHS 17  --  24* 23*  --   --   --    < > = values in this interval not displayed.    Estimated Creatinine Clearance: 39.1 mL/min (A) (by C-G formula based on SCr of 1.3 mg/dL (H)).   Medical History: Past Medical History:  Diagnosis Date  . AAA (abdominal aortic aneurysm) ([place])    2004, Pt stated not there now  . Anginal pain ([place])   . Anxiety   . Arthritis   . BPH (benign prostatic hypertrophy)   . CAD (coronary artery disease)   . Carotid artery disease ([place])    Right carotid stent 2013  . Claudication ([place])   . Colon polyps    adenomatous  . Complex sleep apnea syndrome 11/11/2017  . Coronary artery disease   . Depression   . GERD (gastroesophageal reflux disease)   . Gout   . H/O hiatal hernia   . Hyperlipidemia   . Hypertension   . Hypothyroidism   . MI (myocardial infarction) ([place]) 4/14  . Neuropathy    Hx: of in B/L toes  . Peripheral vascular disease ([place])    with claudication  . Pre-diabetes   . Stroke ([place])   . TIA  (transient ischemic attack)    Hx: of    Assessment: 86 yo male w/ hx of CAD s/p CABG in 1996 & 2004 presenting with CP to start IV heparin. No anticoagulation noted PTA. Originally was planning for cath on Monday, but might manage medically per cardiology given patient's improvement.  Heparin level now therapeutic on drip rate 1000 units/hr. Hgb dropped from 13.6 to 12.2, plts wnl. No infusion issues or overt bleeding noted.  Goal of Therapy:  Heparin level 0.3-0.7 units/ml Monitor platelets by anticoagulation protocol: Yes   Plan:   Continue heparin infusion at 1000 units/hr Monitor daily HL, CBC, s/sx bleeding F/u plans for cath and heparin length of therapy  Richardine Service, PharmD PGY1 Pharmacy Resident Phone: [phone] 09/11/2019  7:18 AM  Please check [email or web address] for unit-specific  pharmacy phone numbers.

## 2019-09-11 NOTE — Plan of Care (Signed)

## 2019-09-11 NOTE — Progress Notes (Signed)
PROGRESS NOTE  Ian Cummings [id number] DOB: [birth year]   PCP: Marton Redwood, MD  Patient is from: Home.  Lives with his wife.  DOA: 09/09/2019 LOS: 1  Brief Narrative / Interim history: 86 year old with significant vasculopathy-CAD s/p CABG in 1996 and 2014, and multiple stents with last one in 2018, PVD s/p rt carotid stent in 2013, rt fem-pop bypass in 2017 and FEA by Dr. Trula Slade, diastolic CHF, moderate MVR, HTN, HLD, CKD-3, OSA, gout and GERD presenting with chest discomfort mainly with activity that seems to improve with nitroglycerin.  In ED, slightly hypertensive.  98% on RA. Cr 1.29.  CBC normal. HS trop 17> 24.  CXR consistent with COPD. EKG afib at 102 and Q-wave in inferior leads.  BNP 284.  Received aspirin.  Also hydralazine for blood pressure.  Started on nitro drip and heparin drip and admitted for ACS evaluation.  Cardiology consulted.  Subjective: Seen and examined this morning. No major events over night or this morning. No complaints other than constipation. Denies chest pain, SOB, palpitation, nausea, vomiting or UTI symptoms.  Objective: Vitals:   09/11/19 0102 09/11/19 0300 09/11/19 0516 09/11/19 0709  BP:    (!) 128/53  Pulse:    (!) 57  Resp:    20  Temp: 97.7 F (36.5 C)  97.6 F (36.4 C) 97.8 F (36.6 C)  TempSrc: Oral  Oral Oral  SpO2:    95%  Weight:  70.3 kg    Height:        Intake/Output Summary (Last 24 hours) at 09/11/2019 1240 Last data filed at 09/11/2019 1231 Gross per 24 hour  Intake 240 ml  Output 1475 ml  Net -1235 ml   Filed Weights   09/10/19 0242 09/11/19 0300  Weight: 70.2 kg 70.3 kg    Examination:  GENERAL: No apparent distress.  Nontoxic. HEENT: MMM.  Vision and hearing grossly intact.  NECK: Supple.  No apparent JVD.  RESP:  No IWOB.  Fair aeration bilaterally. CVS:  RRR. Heart sounds normal.  ABD/GI/GU: BS+. Abd soft, NTND.  MSK/EXT:  Moves extremities. No apparent deformity. No edema.  SKIN: no apparent skin  lesion or wound NEURO: Awake, alert and oriented X4.  No apparent focal neuro deficit. PSYCH: Calm. Normal affect.   Procedures:  None  Microbiology summarized: COVID-19 PCR negative.  Assessment & Plan: Unstable angina in a patient with significant vasculopathy (CAD s/p CABG in 1996 and 2014, and multiple stents with last one in 2018, PVD s/p rt carotid stent in 2013, rt fem-pop bypass in 2017 and FEA)-EKG without acute ischemic finding.  Troponin elevation negligible (17> 24> 23).  Chest pain resolved.  Echo with LVEF of 50 to 55%, no RWMA, moderate LAE and moderate TVR.  Came off IV heparin this morning. -Cardiology following. -Continue ASA, Plavix, statin, Imdur, metoprolol and lisinopril -Ambulate  Atrial fibrillation with mild RVR?  Rate controlled.  EKG on admission concerning for A. fib but difficult to interpret with PAC and unclear p-wave.  Discussed with Gardiner Rhyme, Cardiology who reviewed the EKG and didn't think it is Afib but first-degree AV block with PACs -Repeat twelve-lead EKG -Continue metoprolol  CKD-3A: Stable -Continue monitoring  Chronic diastolic CHF: Appears euvolemic.  No cardiopulmonary symptoms today.  Echo reassuring as above. -On Lasix 20 MWF -Monitor fluid status and renal function closely  OSA -Try nightly CPAP  Hypothyroidism -Continue home Synthroid  Essential hypertension: BP slightly elevated. -Cardiac meds as above  Gout without flareup -Continue home meds  Constipation -MiraLAX and Senokot-S twice daily as needed            DVT prophylaxis: On heparin drip for unstable angina Code Status: DNR/DNI Family Communication: Patient and/or RN. Available if any question.   Status is: Inpatient.  Remains inpatient appropriate because: Unstable angina.    Dispo: The patient is from: Home              Anticipated d/c is to: Home              Anticipated d/c date is: 1 day              Patient currently is not medically stable to  d/c.             Consultants:  Cardiology   Sch Meds:  Scheduled Meds: . allopurinol  100 mg Oral Daily  . amLODipine  5 mg Oral Daily  . [START ON 09/12/2019] aspirin  81 mg Oral Daily  . clopidogrel  75 mg Oral Daily  . escitalopram  10 mg Oral Daily  . [START ON 09/12/2019] furosemide  20 mg Oral Q M,W,F  . isosorbide mononitrate  60 mg Oral Daily  . levothyroxine  88 mcg Oral Q0600  . lisinopril  40 mg Oral Daily  . metoprolol succinate  50 mg Oral Daily  . pantoprazole (PROTONIX) IV  40 mg Intravenous Q24H  . rosuvastatin  40 mg Oral Daily  . sodium chloride flush  3 mL Intravenous Once   Continuous Infusions:  PRN Meds:.acetaminophen, morphine injection **OR** morphine injection, Muscle Rub, ondansetron (ZOFRAN) IV, polyethylene glycol, senna-docusate  Antimicrobials: Anti-infectives (From admission, onward)   None       I have personally reviewed the following labs and images: CBC: Recent Labs  Lab 09/09/19 1310 09/10/19 0228 09/11/19 0319  WBC 7.3 7.0 5.8  NEUTROABS  --  4.6  --   HGB 14.1 13.6 12.2*  HCT 42.6 40.5 37.1*  MCV 97.3 95.7 97.6  PLT 180 190 150   BMP &GFR Recent Labs  Lab 09/09/19 1310 09/10/19 0228 09/11/19 0319  NA 140 138 141  K 4.2 3.9 4.3  CL 106 106 108  CO2 25 23 25   GLUCOSE 93 93 113*  BUN 17 21 21   CREATININE 1.29* 1.20 1.30*  CALCIUM 9.4 9.4 9.2  MG  --  1.9 1.9  PHOS  --   --  3.9   Estimated Creatinine Clearance: 39.1 mL/min (A) (by C-G formula based on SCr of 1.3 mg/dL (H)). Liver & Pancreas: Recent Labs  Lab 09/10/19 0228 09/11/19 0319  AST 26  --   ALT 26  --   ALKPHOS 33*  --   BILITOT 0.6  --   PROT 7.4  --   ALBUMIN 3.4* 3.0*   No results for input(s): LIPASE, AMYLASE in the last 168 hours. No results for input(s): AMMONIA in the last 168 hours. Diabetic: No results for input(s): HGBA1C in the last 72 hours. No results for input(s): GLUCAP in the last 168 hours. Cardiac Enzymes: No  results for input(s): CKTOTAL, CKMB, CKMBINDEX, TROPONINI in the last 168 hours. No results for input(s): PROBNP in the last 8760 hours. Coagulation Profile: Recent Labs  Lab 09/10/19 0158  INR 1.1   Thyroid Function Tests: No results for input(s): TSH, T4TOTAL, FREET4, T3FREE, THYROIDAB in the last 72 hours. Lipid Profile: No results for input(s): CHOL, HDL, LDLCALC, TRIG, CHOLHDL, LDLDIRECT in the last 72 hours. Anemia Panel: No results  for input(s): VITAMINB12, FOLATE, FERRITIN, TIBC, IRON, RETICCTPCT in the last 72 hours. Urine analysis:    Component Value Date/Time   COLORURINE AMBER (A) 08/01/2015 1451   APPEARANCEUR CLEAR 08/01/2015 1451   LABSPEC 1.015 08/01/2015 1451   PHURINE 6.0 08/01/2015 1451   GLUCOSEU NEGATIVE 08/01/2015 1451   HGBUR NEGATIVE 08/01/2015 [place] 08/01/2015 1451   KETONESUR NEGATIVE 08/01/2015 1451   PROTEINUR NEGATIVE 08/01/2015 1451   UROBILINOGEN 1.0 10/15/2012 1014   NITRITE NEGATIVE 08/01/2015 1451   LEUKOCYTESUR NEGATIVE 08/01/2015 1451   Sepsis Labs: Invalid input(s): PROCALCITONIN, [place]  Microbiology: Recent Results (from the past 240 hour(s))  SARS Coronavirus 2 by RT PCR (hospital order, performed in [facility] hospital lab) Nasopharyngeal Nasopharyngeal Swab     Status: None   Collection Time: 09/09/19 11:18 PM   Specimen: Nasopharyngeal Swab  Result Value Ref Range Status   SARS Coronavirus 2 NEGATIVE NEGATIVE Final    Comment: (NOTE) SARS-CoV-2 target nucleic acids are NOT DETECTED. The SARS-CoV-2 RNA is generally detectable in upper and lower respiratory specimens during the acute phase of infection. The lowest concentration of SARS-CoV-2 viral copies this assay can detect is 250 copies / mL. A negative result does not preclude SARS-CoV-2 infection and should not be used as the sole basis for treatment or other patient management decisions.  A negative result may occur with improper specimen  collection / handling, submission of specimen other than nasopharyngeal swab, presence of viral mutation(s) within the areas targeted by this assay, and inadequate number of viral copies (<250 copies / mL). A negative result must be combined with clinical observations, patient history, and epidemiological information. Fact Sheet for Patients:   StrictlyIdeas.no Fact Sheet for Healthcare Providers: BankingDealers.co.za This test is not yet approved or cleared  by the [place] FDA and has been authorized for detection and/or diagnosis of SARS-CoV-2 by FDA under an Emergency Use Authorization (EUA).  This EUA will remain in effect (meaning this test can be used) for the duration of the COVID-19 declaration under Section 564(b)(1) of the Act, 21 U.S.C. section 360bbb-3(b)(1), unless the authorization is terminated or revoked sooner. Performed at [facility], [place] [address], [place],  [address]     Radiology Studies: No results found.   Ancel Easler T. [place]  If 7PM-7AM, please contact night-coverage [email or web address] Password [facility] 09/11/2019, 12:40 PM

## 2019-09-11 NOTE — Progress Notes (Signed)
Progress Note   Subjective   Doing well today, the patient denies CP or SOB.  No new concerns  Inpatient Medications    Scheduled Meds: . allopurinol  100 mg Oral Daily  . amLODipine  5 mg Oral Daily  . aspirin EC  325 mg Oral Daily  . clopidogrel  75 mg Oral Daily  . escitalopram  10 mg Oral Daily  . [START ON 09/12/2019] furosemide  20 mg Oral Q M,W,F  . isosorbide mononitrate  60 mg Oral Daily  . levothyroxine  88 mcg Oral Q0600  . lisinopril  40 mg Oral Daily  . metoprolol succinate  50 mg Oral Daily  . pantoprazole (PROTONIX) IV  40 mg Intravenous Q24H  . rosuvastatin  40 mg Oral Daily  . sodium chloride flush  3 mL Intravenous Once   Continuous Infusions: . heparin 1,000 Units/hr (09/11/19 0112)   PRN Meds: acetaminophen, morphine injection **OR** morphine injection, Muscle Rub, ondansetron (ZOFRAN) IV   Vital Signs    Vitals:   09/11/19 0102 09/11/19 0300 09/11/19 0516 09/11/19 0709  BP:    (!) 128/53  Pulse:    (!) 57  Resp:    20  Temp: 97.7 F (36.5 C)  97.6 F (36.4 C) 97.8 F (36.6 C)  TempSrc: Oral  Oral Oral  SpO2:    95%  Weight:  70.3 kg    Height:        Intake/Output Summary (Last 24 hours) at 09/11/2019 0907 Last data filed at 09/11/2019 0518 Gross per 24 hour  Intake 240 ml  Output 1250 ml  Net -1010 ml   Filed Weights   09/10/19 0242 09/11/19 0300  Weight: 70.2 kg 70.3 kg    Physical Exam   GEN- The patient is well appearing, alert and oriented x 3 today.   Head- normocephalic, atraumatic Eyes-  Sclera clear, conjunctiva pink Ears- hearing intact Oropharynx- clear Neck- supple, Lungs-  normal work of breathing Heart- Regular rate and rhythm  GI- soft  Extremities- no clubbing, cyanosis, or edema  MS- no significant deformity or atrophy Skin- no rash or lesion Psych- euthymic mood, full affect Neuro- strength and sensation are intact   Labs    Chemistry Recent Labs  Lab 09/09/19 1310 09/10/19 0228 09/11/19 0319    NA 140 138 141  K 4.2 3.9 4.3  CL 106 106 108  CO2 25 23 25   GLUCOSE 93 93 113*  BUN 17 21 21   CREATININE 1.29* 1.20 1.30*  CALCIUM 9.4 9.4 9.2  PROT  --  7.4  --   ALBUMIN  --  3.4* 3.0*  AST  --  26  --   ALT  --  26  --   ALKPHOS  --  33*  --   BILITOT  --  0.6  --   GFRNONAA 49* 54* 49*  GFRAA 57* >60 56*  ANIONGAP 9 9 8      Hematology Recent Labs  Lab 09/09/19 1310 09/10/19 0228 09/11/19 0319  WBC 7.3 7.0 5.8  RBC 4.38 4.23 3.80*  HGB 14.1 13.6 12.2*  HCT 42.6 40.5 37.1*  MCV 97.3 95.7 97.6  MCH 32.2 32.2 32.1  MCHC 33.1 33.6 32.9  RDW 13.4 13.7 13.8  PLT 180 190 150   Patient Profile    Ian Cummings is a 86 y.o. male with a history of CAD s/p CABG, HFpEF, PAD w/ multiple prior revasc, and nonrheumatic moderate mitral regurgitation. He is being seen today  for the evaluation of chest pain.    Assessment & Plan    1.  Acute coronary syndrome/ CAD Clinically improved with increased imdur Given advanced age, we will try to avoid cath at this time Stop heparin Continue ASA and plavix Ambulate today If no further chest pain, anticipate discharge tomorrow  2. Hypertensive cardiovascular disease with chronic diastolic dysfunction Stable No change required today  3. HL Continue crestor   4. MR Mild by echo yesterday (reviewed)  Anticipate discharge tomorrow if he does well with ambulation today with close outpatient cardiology followup  Thompson Grayer MD, [facility] 09/11/2019 9:07 AM

## 2019-09-11 NOTE — Progress Notes (Signed)
Patient ambulated today. No chest pain, no other complaints.

## 2019-09-11 NOTE — Plan of Care (Signed)

## 2019-09-11 NOTE — Progress Notes (Signed)
Patient refuses to wear CPAP.

## 2019-09-12 DIAGNOSIS — E782 Mixed hyperlipidemia: Secondary | ICD-10-CM

## 2019-09-12 LAB — CBC
HCT: 37.2 % — ABNORMAL LOW (ref 39.0–52.0)
Hemoglobin: 12.4 g/dL — ABNORMAL LOW (ref 13.0–17.0)
MCH: 32.5 pg (ref 26.0–34.0)
MCHC: 33.3 g/dL (ref 30.0–36.0)
MCV: 97.4 fL (ref 80.0–100.0)
Platelets: 158 10*3/uL (ref 150–400)
RBC: 3.82 MIL/uL — ABNORMAL LOW (ref 4.22–5.81)
RDW: 13.5 % (ref 11.5–15.5)
WBC: 5.4 10*3/uL (ref 4.0–10.5)
nRBC: 0 % (ref 0.0–0.2)

## 2019-09-12 LAB — RENAL FUNCTION PANEL
Albumin: 3 g/dL — ABNORMAL LOW (ref 3.5–5.0)
Anion gap: 7 (ref 5–15)
BUN: 20 mg/dL (ref 8–23)
CO2: 25 mmol/L (ref 22–32)
Calcium: 9.3 mg/dL (ref 8.9–10.3)
Chloride: 109 mmol/L (ref 98–111)
Creatinine, Ser: 1.09 mg/dL (ref 0.61–1.24)
GFR calc Af Amer: >60 mL/min (ref >60)
GFR calc non Af Amer: >60 mL/min (ref >60)
Glucose, Bld: 111 mg/dL — ABNORMAL HIGH (ref 70–99)
Phosphorus: 4.3 mg/dL (ref 2.5–4.6)
Potassium: 4.1 mmol/L (ref 3.5–5.1)
Sodium: 141 mmol/L (ref 135–145)

## 2019-09-12 LAB — MAGNESIUM: Magnesium: 1.9 mg/dL (ref 1.7–2.4)

## 2019-09-12 MED ORDER — PANTOPRAZOLE SODIUM 40 MG PO TBEC: 40.0000 mg | DELAYED_RELEASE_TABLET | Freq: Every day | ORAL | Status: DC

## 2019-09-12 MED ORDER — ISOSORBIDE MONONITRATE ER 60 MG PO TB24: 60.0000 mg | ORAL_TABLET | Freq: Every day | ORAL | 1 refills | Status: DC

## 2019-09-12 MED ORDER — SENNOSIDES-DOCUSATE SODIUM 8.6-50 MG PO TABS
1.0000 | ORAL_TABLET | Freq: Two times a day (BID) | ORAL | 0 refills | Status: DC | PRN
Start: 2019-09-12 — End: 2019-11-09

## 2019-09-12 NOTE — Progress Notes (Signed)
Progress Note  Patient Name: Ian Cummings Date of Encounter: 09/12/2019  Primary Cardiologist: Shirlee More, MD   Subjective   86 year old gentleman with a history of coronary artery disease and peripheral vascular disease.  He was admitted with chest pain.  Troponin levels were unremarkable.  He has been pain-free.  Heparin has been stopped. He has ambulated without any difficulty.  Denies any current chest pain.  Inpatient Medications    Scheduled Meds: . allopurinol  100 mg Oral Daily  . amLODipine  5 mg Oral Daily  . aspirin  81 mg Oral Daily  . clopidogrel  75 mg Oral Daily  . escitalopram  10 mg Oral Daily  . furosemide  20 mg Oral Q M,W,F  . isosorbide mononitrate  60 mg Oral Daily  . levothyroxine  88 mcg Oral Q0600  . lisinopril  40 mg Oral Daily  . metoprolol succinate  50 mg Oral Daily  . pantoprazole  40 mg Oral QHS  . rosuvastatin  40 mg Oral Daily  . sodium chloride flush  3 mL Intravenous Once   Continuous Infusions:  PRN Meds: acetaminophen, morphine injection **OR** morphine injection, Muscle Rub, ondansetron (ZOFRAN) IV, polyethylene glycol, senna-docusate   Vital Signs    Vitals:   09/11/19 2010 09/12/19 0106 09/12/19 0326 09/12/19 0752  BP: (!) 124/58 (!) 129/56 (!) 127/59 (!) 134/53  Pulse: (!) 56 (!) 53 60 (!) 58  Resp: 19 19 19 16   Temp: 98 F (36.7 C) 97.6 F (36.4 C) (!) 97.5 F (36.4 C) 98.1 F (36.7 C)  TempSrc: Oral Oral Oral Oral  SpO2: 97% 95% 96% 98%  Weight:   69.8 kg   Height:        Intake/Output Summary (Last 24 hours) at 09/12/2019 0912 Last data filed at 09/12/2019 0757 Gross per 24 hour  Intake 1080 ml  Output 1750 ml  Net -670 ml   Last 3 Weights 09/12/2019 09/11/2019 09/10/2019  Weight (lbs) 153 lb 12.8 oz 155 lb 154 lb 11.2 oz  Weight (kg) 69.763 kg 70.308 kg 70.171 kg      Telemetry     NSR  - Personally Reviewed  ECG      - Personally Reviewed  Physical Exam   GEN: elderly man.   No acute distress.     Neck: No JVD Cardiac: RRR, no murmurs, rubs, or gallops.  Respiratory: Clear to auscultation bilaterally. GI: Soft, nontender, non-distended  MS: No edema; No deformity. Neuro:  Nonfocal  Psych: Normal affect   Labs    High Sensitivity Troponin:   Recent Labs  Lab 09/09/19 1310 09/09/19 2031 09/10/19 0158  TROPONINIHS 17 24* 23*      Chemistry Recent Labs  Lab 09/10/19 0228 09/11/19 0319 09/12/19 0353  NA 138 141 141  K 3.9 4.3 4.1  CL 106 108 109  CO2 23 25 25   GLUCOSE 93 113* 111*  BUN 21 21 20   CREATININE 1.20 1.30* 1.09  CALCIUM 9.4 9.2 9.3  PROT 7.4  --   --   ALBUMIN 3.4* 3.0* 3.0*  AST 26  --   --   ALT 26  --   --   ALKPHOS 33*  --   --   BILITOT 0.6  --   --   GFRNONAA 54* 49* >60  GFRAA >60 56* >60  ANIONGAP 9 8 7      Hematology Recent Labs  Lab 09/10/19 0228 09/11/19 0319 09/12/19 0353  WBC 7.0 5.8 5.4  RBC 4.23 3.80* 3.82*  HGB 13.6 12.2* 12.4*  HCT 40.5 37.1* 37.2*  MCV 95.7 97.6 97.4  MCH 32.2 32.1 32.5  MCHC 33.6 32.9 33.3  RDW 13.7 13.8 13.5  PLT 190 150 158    BNP Recent Labs  Lab 09/10/19 0158  BNP 284.4*     DDimer No results for input(s): DDIMER in the last 168 hours.   Radiology    ECHOCARDIOGRAM COMPLETE  Result Date: 09/10/2019    ECHOCARDIOGRAM REPORT   Patient Name:   AKARSH HEINSOHN Date of Exam: 09/10/2019 Medical Rec #:  [id number]     Height:       71.0 in Accession #:    [id number]    Weight:       154.7 lb Date of Birth:  Nov 17, 1928    BSA:          1.891 m Patient Age:    60 years      BP:           143/59 mmHg Patient Gender: M             HR:           58 bpm. Exam Location:  Inpatient Procedure: 2D Echo, Cardiac Doppler and Color Doppler Indications:    Chest Pain 786.50/R07.9  History:        Patient has prior history of Echocardiogram examinations, most                 recent 01/17/2019. CAD, Prior CABG, Mitral Valve Disease,                 Arrythmias:Atrial Fibrillation and RBBB; Risk                  Factors:Dyslipidemia, Hypertension, Sleep Apnea and Former                 Smoker. CKD. Bradycardia.  Sonographer:    Clayton Lefort RDCS (AE) Referring Phys: [id number] [place]  1. Inferior basal hypokinesis . Left ventricular ejection fraction, by estimation, is 50 to 55%. The left ventricle has low normal function. The left ventricle has no regional wall motion abnormalities. Left ventricular diastolic parameters were normal.  2. Right ventricular systolic function is normal. The right ventricular size is normal. There is mildly elevated pulmonary artery systolic pressure.  3. Left atrial size was moderately dilated.  4. The mitral valve is degenerative. Mild mitral valve regurgitation. No evidence of mitral stenosis.  5. Tricuspid valve regurgitation is moderate.  6. The aortic valve is tricuspid. Aortic valve regurgitation is not visualized. Mild aortic valve stenosis.  7. The inferior vena cava is normal in size with greater than 50% respiratory variability, suggesting right atrial pressure of 3 mmHg. FINDINGS  Left Ventricle: Inferior basal hypokinesis. Left ventricular ejection fraction, by estimation, is 50 to 55%. The left ventricle has low normal function. The left ventricle has no regional wall motion abnormalities. The left ventricular internal cavity size was normal in size. There is no left ventricular hypertrophy. Left ventricular diastolic parameters were normal. Right Ventricle: The right ventricular size is normal. No increase in right ventricular wall thickness. Right ventricular systolic function is normal. There is mildly elevated pulmonary artery systolic pressure. The tricuspid regurgitant velocity is 2.90  m/s, and with an assumed right atrial pressure of 8 mmHg, the estimated right ventricular systolic pressure is [id number] mmHg. Left Atrium: Left atrial size was moderately dilated. Right Atrium: Right atrial size  was normal in size. Pericardium: There is no evidence of  pericardial effusion. Mitral Valve: The mitral valve is degenerative in appearance. There is moderate thickening of the mitral valve leaflet(s). There is moderate calcification of the mitral valve leaflet(s). Normal mobility of the mitral valve leaflets. Mild mitral annular calcification. Mild mitral valve regurgitation. No evidence of mitral valve stenosis. MV peak gradient, 7.5 mmHg. The mean mitral valve gradient is 2.0 mmHg. Tricuspid Valve: The tricuspid valve is normal in structure. Tricuspid valve regurgitation is moderate . No evidence of tricuspid stenosis. Aortic Valve: The aortic valve is tricuspid. . There is moderate thickening and moderate calcification of the aortic valve. Aortic valve regurgitation is not visualized. Mild aortic stenosis is present. There is moderate thickening of the aortic valve. There is moderate calcification of the aortic valve. Aortic valve mean gradient measures 8.7 mmHg. Aortic valve peak gradient measures 16.3 mmHg. Aortic valve area, by VTI measures 1.75 cm. Pulmonic Valve: The pulmonic valve was normal in structure. Pulmonic valve regurgitation is not visualized. No evidence of pulmonic stenosis. Aorta: The aortic root is normal in size and structure. Venous: The inferior vena cava is normal in size with greater than 50% respiratory variability, suggesting right atrial pressure of 3 mmHg. IAS/Shunts: No atrial level shunt detected by color flow Doppler.  LEFT VENTRICLE PLAX 2D LVIDd:         5.20 cm LVIDs:         3.80 cm LV PW:         1.50 cm LV IVS:        1.40 cm LVOT diam:     2.10 cm LV SV:         82 LV SV Index:   43 LVOT Area:     3.46 cm  RIGHT VENTRICLE            IVC RV Basal diam:  3.20 cm    IVC diam: 1.30 cm RV S prime:     7.42 cm/s TAPSE (M-mode): 1.3 cm LEFT ATRIUM              Index       RIGHT ATRIUM           Index LA diam:        4.70 cm  2.49 cm/m  RA Area:     21.70 cm LA Vol (A2C):   96.1 ml  50.83 ml/m RA Volume:   54.30 ml  28.72 ml/m LA  Vol (A4C):   100.0 ml 52.90 ml/m LA Biplane Vol: 98.2 ml  51.94 ml/m  AORTIC VALVE AV Area (Vmax):    1.63 cm AV Area (Vmean):   1.64 cm AV Area (VTI):     1.75 cm AV Vmax:           202.00 cm/s AV Vmean:          136.333 cm/s AV VTI:            0.469 m AV Peak Grad:      16.3 mmHg AV Mean Grad:      8.7 mmHg LVOT Vmax:         95.10 cm/s LVOT Vmean:        64.400 cm/s LVOT VTI:          0.237 m LVOT/AV VTI ratio: 0.50  AORTA Ao Root diam: 3.30 cm Ao Asc diam:  3.20 cm MITRAL VALVE  TRICUSPID VALVE MV Area (PHT): 2.27 cm   TR Peak grad:   33.6 mmHg MV Peak grad:  7.5 mmHg   TR Vmax:        290.00 cm/s MV Mean grad:  2.0 mmHg MV Vmax:       1.37 m/s   SHUNTS MV Vmean:      69.5 cm/s  Systemic VTI:  0.24 m MR Peak grad: 168.0 mmHg  Systemic Diam: 2.10 cm MR Mean grad: 101.0 mmHg MR Vmax:      648.00 cm/s MR Vmean:     469.0 cm/s Jenkins Rouge MD Electronically signed by Jenkins Rouge MD Signature Date/Time: 09/10/2019/10:23:42 AM    Final     Cardiac Studies      Patient Profile     86 y.o. male with severe CAD and PVD admitted with cp   Assessment & Plan    Chest pain: Troponin levels were very minimally elevated and not in a pattern consistent with acute coronary syndrome.  He received IV heparin and stabilized.  Heparin was stopped yesterday and he has been very stable.  He has been able to ambulate without any issues.  I would recommend continued medical therapy.  He needs prescription for nitroglycerin sent in.  The isosorbide has been increased to 60 mg a day. Continue metoprolol.   Hyperlipidemia: Continue rosuvastatin 40 mg a day.      For questions or updates, please contact [place] Please consult [email or web address] for contact info under        Signed, Mertie Moores, MD  09/12/2019, 9:12 AM

## 2019-09-12 NOTE — Discharge Summary (Signed)
Physician Discharge Summary  Ian Cummings VVO:[id number] DOB: 09/07/1928 DOA: 09/09/2019  PCP: Marton Redwood, MD  Admit date: 09/09/2019 Discharge date: 09/12/2019  Admitted From: Home Disposition: Home  Recommendations for Outpatient Follow-up:  1. Follow ups as below. 2. Please obtain CBC/BMP/Mag at follow up 3. Please follow up on the following pending results: None  Home Health: None required Equipment/Devices: None required  Discharge Condition: Stable CODE STATUS: Full code  Follow-up Information    Marton Redwood, MD. Schedule an appointment as soon as possible for a visit in 1 week.   Specialty: Internal Medicine Why: The office will call patient Contact information: [place] [address] [phone]        Richardo Priest, MD. Schedule an appointment as soon as possible for a visit on 10/07/2019.   Specialty: Cardiology Why: Please arrive 15 minutes early for your 1:35pm post-hospital cardiology follow-up appointment Contact information: [place] [address] [facility] [facility] Course: 86 year old with significant vasculopathy-CAD s/p CABG in 1996 and 2014, and multiple stents with last one in 2018, PVD s/p rt carotid stent in 2013, rt fem-pop bypass in 2017 and FEA by Dr. Trula Slade, diastolic CHF, moderate MVR, HTN, HLD, CKD-3, OSA, gout and GERD presenting with chest discomfort mainly with activity that seems to improve with nitroglycerin.  In ED, slightly hypertensive.  98% on RA. Cr 1.29.  CBC normal. HS trop 17> 24.  CXR consistent with COPD. EKG afib at 102 and Q-wave in inferior leads.  BNP 284.  Received aspirin.  Also hydralazine for blood pressure.  Started on nitro drip and heparin drip and admitted for ACS evaluation.  Cardiology consulted. The next day, chest pain resolved.  He came off nitro drip and transition to p.o. Imdur.  Continues to remain chest pain-free.  Heparin discontinued 48 hours later.   Ambulated on room air without cardiopulmonary symptoms.  Imdur increased to 60 mg daily.  Cleared for discharge by cardiology.  See individual problem list below for more hospital course.  Discharge Diagnoses:  Unstable angina in a patient with significant vasculopathy (CAD s/p CABG in 1996 and 2014, and multiple stents with last one in 2018, PVD s/p rt carotid stent in 2013, rt fem-pop bypass in 2017 and FEA)-EKG without acute ischemic finding.  Troponin elevation negligible (17> 24> 23).  Chest pain resolved.  Echo with LVEF of 50 to 55%, no RWMA, moderate LAE and moderate TVR.  Came off IV heparin.  Remained chest pain-free throughout hospitalization. -Imdur increased to 60 mg daily. -Continue ASA, Plavix, statin,metoprolol, lisinopril and as needed nitro.  Bradycardia/first-degree AV block: Initially thought to have A. Fib but further review of EKG and subsequent EKGs with p waves.   CKD-3A: Stable -Continue monitoring  Chronic diastolic CHF: Appears euvolemic.  No cardiopulmonary symptoms today.  Echo reassuring as above. -On Lasix 20 MWF  Hypothyroidism -Continue home Synthroid  Essential hypertension: BP slightly elevated. -Cardiac meds as above  Gout without flareup -Continue home meds   Constipation -MiraLAX and Senokot-S twice daily as needed                 Discharge Exam: Vitals:   09/12/19 0326 09/12/19 0752  BP: (!) 127/59 (!) 134/53  Pulse: 60 (!) 58  Resp: 19 16  Temp: (!) 97.5 F (36.4 C) 98.1 F (36.7 C)  SpO2: 96% 98%    GENERAL: No apparent distress.  Nontoxic. HEENT: MMM.  Vision and hearing grossly intact.  NECK: Supple.  No apparent JVD.  RESP: On room air.  No IWOB.  Fair aeration bilaterally. CVS:  RRR. Heart sounds normal.  ABD/GI/GU: Bowel sounds present. Soft. Non tender.  MSK/EXT:  Moves extremities. No apparent deformity. No edema.  SKIN: no apparent skin lesion or wound NEURO: Awake, alert and oriented appropriately.   No apparent focal neuro deficit. PSYCH: Calm. Normal affect.  Discharge Instructions  Discharge Instructions    (HEART FAILURE PATIENTS) Call MD:  Anytime you have any of the following symptoms: 1) 3 pound weight gain in 24 hours or 5 pounds in 1 week 2) shortness of breath, with or without a dry hacking cough 3) swelling in the hands, feet or stomach 4) if you have to sleep on extra pillows at night in order to breathe.   Complete by: As directed    Call MD for:  difficulty breathing, headache or visual disturbances   Complete by: As directed    Call MD for:  extreme fatigue   Complete by: As directed    Call MD for:  persistant dizziness or light-headedness   Complete by: As directed    Call MD for:  persistant nausea and vomiting   Complete by: As directed    Call MD for:  severe uncontrolled pain   Complete by: As directed    Diet - low sodium heart healthy   Complete by: As directed    Discharge instructions   Complete by: As directed    It has been a pleasure taking care of you! You were hospitalized with chest pain likely related to your heart.  Your chest pain resolved with adjustment of your heart medications.  Please review your new medication list and the directions before you take your medications.  Please follow-up with your primary care doctor and cardiologist in 1 to 2 weeks.   Take care,   Increase activity slowly   Complete by: As directed      Allergies as of 09/12/2019      Reactions   Amoxicillin Rash   Metoprolol Itching      Medication List    TAKE these medications   Accu-Chek Aviva Plus test strip Generic drug: glucose blood   Accu-Chek Aviva Plus w/Device Kit   allopurinol 100 MG tablet Commonly known as: ZYLOPRIM Take 100 mg by mouth daily.   amLODipine 5 MG tablet Commonly known as: NORVASC Take 5 mg by mouth daily.   aspirin EC 81 MG tablet Take 81 mg by mouth daily.   clopidogrel 75 MG tablet Commonly known as: PLAVIX Take 75 mg  by mouth daily.   escitalopram 10 MG tablet Commonly known as: LEXAPRO Take 10 mg by mouth daily.   furosemide 20 MG tablet Commonly known as: LASIX TAKE 1 TABLET BY MOUTH EVERY DAY What changed:   when to take this  Another medication with the same name was removed. Continue taking this medication, and follow the directions you see here.   isosorbide mononitrate 60 MG 24 hr tablet Commonly known as: IMDUR Take 1 tablet (60 mg total) by mouth daily. What changed:   medication strength  how much to take   lansoprazole 30 MG capsule Commonly known as: PREVACID Take 30 mg by mouth daily.   levothyroxine 88 MCG tablet Commonly known as: SYNTHROID Take 88 mcg by mouth daily before breakfast.   lisinopril 40 MG tablet Commonly known as: ZESTRIL Take 40 mg by mouth daily.  metoprolol succinate 50 MG 24 hr tablet Commonly known as: TOPROL-XL Take 50 mg by mouth daily.   multivitamin Tabs tablet Take 1 tablet by mouth daily.   Ranexa 500 MG 12 hr tablet Generic drug: ranolazine Take 500 mg by mouth 2 (two) times daily.   rosuvastatin 40 MG tablet Commonly known as: CRESTOR Take 40 mg by mouth daily.   senna-docusate 8.6-50 MG tablet Commonly known as: Senokot-S Take 1 tablet by mouth 2 (two) times daily between meals as needed for mild constipation.       Consultations:  Cardiology  Procedures/Studies:   DG Chest 2 View  Result Date: 09/09/2019 CLINICAL DATA:  Chest pain, shortness of breath EXAM: CHEST - 2 VIEW COMPARISON:  01/12/2019 FINDINGS: There is hyperinflation of the lungs compatible with COPD. Prior CABG. Cardiomegaly. Bibasilar atelectasis or scarring. No effusions or acute bony abnormality. IMPRESSION: COPD/chronic changes. No active disease. Electronically Signed   By: Rolm Baptise M.D.   On: 09/09/2019 13:39   ECHOCARDIOGRAM COMPLETE  Result Date: 09/10/2019    ECHOCARDIOGRAM REPORT   Patient Name:   Ian Cummings Date of Exam: 09/10/2019  Medical Rec #:  [id number]     Height:       71.0 in Accession #:    [id number]    Weight:       154.7 lb Date of Birth:  Mar 27, 1929    BSA:          1.891 m Patient Age:    86 years      BP:           143/59 mmHg Patient Gender: M             HR:           58 bpm. Exam Location:  Inpatient Procedure: 2D Echo, Cardiac Doppler and Color Doppler Indications:    Chest Pain 786.50/R07.9  History:        Patient has prior history of Echocardiogram examinations, most                 recent 01/17/2019. CAD, Prior CABG, Mitral Valve Disease,                 Arrythmias:Atrial Fibrillation and RBBB; Risk                 Factors:Dyslipidemia, Hypertension, Sleep Apnea and Former                 Smoker. CKD. Bradycardia.  Sonographer:    Clayton Lefort RDCS (AE) Referring Phys: [id number] [place]  1. Inferior basal hypokinesis . Left ventricular ejection fraction, by estimation, is 50 to 55%. The left ventricle has low normal function. The left ventricle has no regional wall motion abnormalities. Left ventricular diastolic parameters were normal.  2. Right ventricular systolic function is normal. The right ventricular size is normal. There is mildly elevated pulmonary artery systolic pressure.  3. Left atrial size was moderately dilated.  4. The mitral valve is degenerative. Mild mitral valve regurgitation. No evidence of mitral stenosis.  5. Tricuspid valve regurgitation is moderate.  6. The aortic valve is tricuspid. Aortic valve regurgitation is not visualized. Mild aortic valve stenosis.  7. The inferior vena cava is normal in size with greater than 50% respiratory variability, suggesting right atrial pressure of 3 mmHg. FINDINGS  Left Ventricle: Inferior basal hypokinesis. Left ventricular ejection fraction, by estimation, is 50 to 55%. The left ventricle has low normal function. The  left ventricle has no regional wall motion abnormalities. The left ventricular internal cavity size was normal in size. There  is no left ventricular hypertrophy. Left ventricular diastolic parameters were normal. Right Ventricle: The right ventricular size is normal. No increase in right ventricular wall thickness. Right ventricular systolic function is normal. There is mildly elevated pulmonary artery systolic pressure. The tricuspid regurgitant velocity is 2.90  m/s, and with an assumed right atrial pressure of 8 mmHg, the estimated right ventricular systolic pressure is 84.6 mmHg. Left Atrium: Left atrial size was moderately dilated. Right Atrium: Right atrial size was normal in size. Pericardium: There is no evidence of pericardial effusion. Mitral Valve: The mitral valve is degenerative in appearance. There is moderate thickening of the mitral valve leaflet(s). There is moderate calcification of the mitral valve leaflet(s). Normal mobility of the mitral valve leaflets. Mild mitral annular calcification. Mild mitral valve regurgitation. No evidence of mitral valve stenosis. MV peak gradient, 7.5 mmHg. The mean mitral valve gradient is 2.0 mmHg. Tricuspid Valve: The tricuspid valve is normal in structure. Tricuspid valve regurgitation is moderate . No evidence of tricuspid stenosis. Aortic Valve: The aortic valve is tricuspid. . There is moderate thickening and moderate calcification of the aortic valve. Aortic valve regurgitation is not visualized. Mild aortic stenosis is present. There is moderate thickening of the aortic valve. There is moderate calcification of the aortic valve. Aortic valve mean gradient measures 8.7 mmHg. Aortic valve peak gradient measures 16.3 mmHg. Aortic valve area, by VTI measures 1.75 cm. Pulmonic Valve: The pulmonic valve was normal in structure. Pulmonic valve regurgitation is not visualized. No evidence of pulmonic stenosis. Aorta: The aortic root is normal in size and structure. Venous: The inferior vena cava is normal in size with greater than 50% respiratory variability, suggesting right atrial  pressure of 3 mmHg. IAS/Shunts: No atrial level shunt detected by color flow Doppler.  LEFT VENTRICLE PLAX 2D LVIDd:         5.20 cm LVIDs:         3.80 cm LV PW:         1.50 cm LV IVS:        1.40 cm LVOT diam:     2.10 cm LV SV:         82 LV SV Index:   43 LVOT Area:     3.46 cm  RIGHT VENTRICLE            IVC RV Basal diam:  3.20 cm    IVC diam: 1.30 cm RV S prime:     7.42 cm/s TAPSE (M-mode): 1.3 cm LEFT ATRIUM              Index       RIGHT ATRIUM           Index LA diam:        4.70 cm  2.49 cm/m  RA Area:     21.70 cm LA Vol (A2C):   96.1 ml  50.83 ml/m RA Volume:   54.30 ml  28.72 ml/m LA Vol (A4C):   100.0 ml 52.90 ml/m LA Biplane Vol: 98.2 ml  51.94 ml/m  AORTIC VALVE AV Area (Vmax):    1.63 cm AV Area (Vmean):   1.64 cm AV Area (VTI):     1.75 cm AV Vmax:           202.00 cm/s AV Vmean:          136.333 cm/s AV VTI:  0.469 m AV Peak Grad:      16.3 mmHg AV Mean Grad:      8.7 mmHg LVOT Vmax:         95.10 cm/s LVOT Vmean:        64.400 cm/s LVOT VTI:          0.237 m LVOT/AV VTI ratio: 0.50  AORTA Ao Root diam: 3.30 cm Ao Asc diam:  3.20 cm MITRAL VALVE              TRICUSPID VALVE MV Area (PHT): 2.27 cm   TR Peak grad:   33.6 mmHg MV Peak grad:  7.5 mmHg   TR Vmax:        290.00 cm/s MV Mean grad:  2.0 mmHg MV Vmax:       1.37 m/s   SHUNTS MV Vmean:      69.5 cm/s  Systemic VTI:  0.24 m MR Peak grad: 168.0 mmHg  Systemic Diam: 2.10 cm MR Mean grad: 101.0 mmHg MR Vmax:      648.00 cm/s MR Vmean:     469.0 cm/s Jenkins Rouge MD Electronically signed by Jenkins Rouge MD Signature Date/Time: 09/10/2019/10:23:42 AM    Final         The results of significant diagnostics from this hospitalization (including imaging, microbiology, ancillary and laboratory) are listed below for reference.     Microbiology: Recent Results (from the past 240 hour(s))  SARS Coronavirus 2 by RT PCR (hospital order, performed in [facility] hospital lab) Nasopharyngeal Nasopharyngeal Swab     Status:  None   Collection Time: 09/09/19 11:18 PM   Specimen: Nasopharyngeal Swab  Result Value Ref Range Status   SARS Coronavirus 2 NEGATIVE NEGATIVE Final    Comment: (NOTE) SARS-CoV-2 target nucleic acids are NOT DETECTED. The SARS-CoV-2 RNA is generally detectable in upper and lower respiratory specimens during the acute phase of infection. The lowest concentration of SARS-CoV-2 viral copies this assay can detect is 250 copies / mL. A negative result does not preclude SARS-CoV-2 infection and should not be used as the sole basis for treatment or other patient management decisions.  A negative result may occur with improper specimen collection / handling, submission of specimen other than nasopharyngeal swab, presence of viral mutation(s) within the areas targeted by this assay, and inadequate number of viral copies (<250 copies / mL). A negative result must be combined with clinical observations, patient history, and epidemiological information. Fact Sheet for Patients:   StrictlyIdeas.no Fact Sheet for Healthcare Providers: BankingDealers.co.za This test is not yet approved or cleared  by the [place] FDA and has been authorized for detection and/or diagnosis of SARS-CoV-2 by FDA under an Emergency Use Authorization (EUA).  This EUA will remain in effect (meaning this test can be used) for the duration of the COVID-19 declaration under Section 564(b)(1) of the Act, 21 U.S.C. section 360bbb-3(b)(1), unless the authorization is terminated or revoked sooner. Performed at [facility], [place] [address], [place], [place] [address]      Labs: BNP (last 3 results) Recent Labs    09/10/19 0158  BNP 503.5*   Basic Metabolic Panel: Recent Labs  Lab 09/09/19 1310 09/10/19 0228 09/11/19 0319 09/12/19 0353  NA 140 138 141 141  K 4.2 3.9 4.3 4.1  CL 106 106 108 109  CO2 _0 GLUCOSE 93 93 113* 111*  BUN _1 CREATININE 1.29* 1.20 1.30* 1.09  CALCIUM 9.4 9.4 9.2 9.3  MG  --  1.9 1.9 1.9  PHOS  --   --  3.9 4.3   Liver Function Tests: Recent Labs  Lab 09/10/19 0228 09/11/19 0319 09/12/19 0353  AST 26  --   --   ALT 26  --   --   ALKPHOS 33*  --   --   BILITOT 0.6  --   --   PROT 7.4  --   --   ALBUMIN 3.4* 3.0* 3.0*   No results for input(s): LIPASE, AMYLASE in the last 168 hours. No results for input(s): AMMONIA in the last 168 hours. CBC: Recent Labs  Lab 09/09/19 1310 09/10/19 0228 09/11/19 0319 09/12/19 0353  WBC 7.3 7.0 5.8 5.4  NEUTROABS  --  4.6  --   --   HGB 14.1 13.6 12.2* 12.4*  HCT 42.6 40.5 37.1* 37.2*  MCV 97.3 95.7 97.6 97.4  PLT 180 190 150 158   Cardiac Enzymes: No results for input(s): CKTOTAL, CKMB, CKMBINDEX, TROPONINI in the last 168 hours. BNP: Invalid input(s): POCBNP CBG: No results for input(s): GLUCAP in the last 168 hours. D-Dimer No results for input(s): DDIMER in the last 72 hours. Hgb A1c No results for input(s): HGBA1C in the last 72 hours. Lipid Profile No results for input(s): CHOL, HDL, LDLCALC, TRIG, CHOLHDL, LDLDIRECT in the last 72 hours. Thyroid function studies No results for input(s): TSH, T4TOTAL, T3FREE, THYROIDAB in the last 72 hours.  Invalid input(s): FREET3 Anemia work up No results for input(s): VITAMINB12, FOLATE, FERRITIN, TIBC, IRON, RETICCTPCT in the last 72 hours. Urinalysis    Component Value Date/Time   COLORURINE AMBER (A) 08/01/2015 1451   APPEARANCEUR CLEAR 08/01/2015 1451   LABSPEC 1.015 08/01/2015 1451   PHURINE 6.0 08/01/2015 1451   GLUCOSEU NEGATIVE 08/01/2015 1451   HGBUR NEGATIVE 08/01/2015 1451   BILIRUBINUR NEGATIVE 08/01/2015 1451   KETONESUR NEGATIVE 08/01/2015 1451   PROTEINUR NEGATIVE 08/01/2015 1451   UROBILINOGEN 1.0 10/15/2012 1014   NITRITE NEGATIVE 08/01/2015 1451   LEUKOCYTESUR NEGATIVE 08/01/2015 1451   Sepsis Labs Invalid input(s): PROCALCITONIN,  WBC,  LACTICIDVEN   Time  coordinating discharge: 35 minutes  SIGNED:  Mercy Riding, MD  [facility] 09/12/2019, 3:26 PM  If 7PM-7AM, please contact night-coverage [email or web address] Password [email or web address]

## 2019-09-13 ENCOUNTER — Telehealth: Payer: Self-pay | Admitting: Cardiology

## 2019-09-13 NOTE — Telephone Encounter (Signed)
I would continue ranolazine

## 2019-09-13 NOTE — Telephone Encounter (Signed)
   Pt c/o medication issue:  1. Name of Medication: ranolazine   2. How are you currently taking this medication (dosage and times per day)?   3. Are you having a reaction (difficulty breathing--STAT)?   4. What is your medication issue? Pt said while he was in the hospital they stop this medication and would like to check with Dr. Bettina Gavia if that's ok

## 2019-09-13 NOTE — Telephone Encounter (Signed)
Spoke with patient and let him know Dr. Joya Gaskins recommendation to continue the ranolazine.  Patient verbalizes understanding and is agreeable to plan of care. Advised patient to call back with any issues or concerns.

## 2019-09-13 NOTE — Telephone Encounter (Signed)
Follow up ° ° °Patient is returning your call. Please call. ° ° ° °

## 2019-09-13 NOTE — Telephone Encounter (Signed)
Left message on patients voicemail to please return our call.   

## 2019-09-15 DIAGNOSIS — I209 Angina pectoris, unspecified: Secondary | ICD-10-CM | POA: Diagnosis not present

## 2019-09-15 DIAGNOSIS — R5383 Other fatigue: Secondary | ICD-10-CM | POA: Diagnosis not present

## 2019-09-15 DIAGNOSIS — M25512 Pain in left shoulder: Secondary | ICD-10-CM | POA: Diagnosis not present

## 2019-09-15 DIAGNOSIS — I959 Hypotension, unspecified: Secondary | ICD-10-CM | POA: Diagnosis not present

## 2019-09-15 DIAGNOSIS — N1831 Chronic kidney disease, stage 3a: Secondary | ICD-10-CM | POA: Diagnosis not present

## 2019-09-29 DIAGNOSIS — M25512 Pain in left shoulder: Secondary | ICD-10-CM | POA: Diagnosis not present

## 2019-09-29 DIAGNOSIS — M75121 Complete rotator cuff tear or rupture of right shoulder, not specified as traumatic: Secondary | ICD-10-CM | POA: Diagnosis not present

## 2019-09-29 DIAGNOSIS — M75122 Complete rotator cuff tear or rupture of left shoulder, not specified as traumatic: Secondary | ICD-10-CM | POA: Diagnosis not present

## 2019-10-04 DIAGNOSIS — M25512 Pain in left shoulder: Secondary | ICD-10-CM | POA: Diagnosis not present

## 2019-10-05 DIAGNOSIS — E871 Hypo-osmolality and hyponatremia: Secondary | ICD-10-CM | POA: Diagnosis not present

## 2019-10-05 DIAGNOSIS — I739 Peripheral vascular disease, unspecified: Secondary | ICD-10-CM | POA: Diagnosis not present

## 2019-10-05 DIAGNOSIS — M75102 Unspecified rotator cuff tear or rupture of left shoulder, not specified as traumatic: Secondary | ICD-10-CM | POA: Diagnosis not present

## 2019-10-05 DIAGNOSIS — I10 Essential (primary) hypertension: Secondary | ICD-10-CM | POA: Diagnosis not present

## 2019-10-05 DIAGNOSIS — R7301 Impaired fasting glucose: Secondary | ICD-10-CM | POA: Diagnosis not present

## 2019-10-06 NOTE — Progress Notes (Signed)
Cardiology Office Note:    Date:  10/07/2019   ID:  Ian Cummings, DOB [birth year], MRN [id number]  PCP:  Marton Redwood, MD  Cardiologist:  Shirlee More, MD    Referring MD: Marton Redwood, MD    ASSESSMENT:    1. Coronary artery disease of native artery of native heart with stable angina pectoris ([place])   2. PAF (paroxysmal atrial fibrillation) ([place])   3. Hypertensive heart disease with chronic diastolic congestive heart failure ([place])   4. Nonrheumatic mitral valve regurgitation   5. Mixed hyperlipidemia    PLAN:    In order of problems listed above:  The complex case recent admitted to the hospital clinical unstable angina mild troponin elevation in the setting of remote bypass surgery and PCI paroxysmal atrial fibrillation heart failure mitral regurgitation.  Strongly encouraged him to accept coronary angiography no acute follow-up I will see him after he returns from his trip to [place].  In the interim he will be anticoagulated. Stable hypertension Mitral regurgitation is stable Continue lipid-lowering treatment   Next appointment: After his return from [place]   Medication Adjustments/Labs and Tests Ordered: Current medicines are reviewed at length with the patient today.  Concerns regarding medicines are outlined above.  Orders Placed This Encounter  Procedures   EKG 12-Lead   No orders of the defined types were placed in this encounter.   Chief Complaint  Patient presents with   Follow-up    History of Present Illness:    Ian Cummings is a 86 y.o. male with a hx of coronary artery disease status post CABG as well as PCI, hyperlipidemia, hypertension, peripheral artery disease hypothyroidism seen 02/01/2019 by Dr Harriet Masson for MR and SOB.  I independently reviewed his echocardiogram he has hypokinesia of the basilar inferior wall and septum has mitral regurgitation which is moderate it is central very narrow vena contracta and unfortunately pulmonary vein was not  evaluated.  I do not see criteria for severe mitral regurgitation.  Aortic valve is mildly thickened and restricted and when I reviewed the study there is a minimal gradient with peak and mean gradients are in the range of 20 and 9 mmHg.  He was last seen 04/12/2019.   3 wk ago  (09/10/19) 3 wk ago  (09/09/19) 3 wk ago  (09/09/19)   Troponin I (High Sensitivity) <18 ng/L 23 High   24 High  CM  17 CM    Compliance with diet, lifestyle and medications: YES  He was recently admitted to [facility] 09/09/2019 for 09/12/1999 with chest pain and minimal troponin high-sensitivity assay.  Is seen by cardiology did not undergo coronary angiography advised ongoing medical therapy and was discharged on higher dose of oral nitrates.   3 wk ago  (09/10/19) 4 wk ago  (09/09/19) 4 wk ago  (09/09/19)   Troponin I (High Sensitivity) <18 ng/L 23 High   24 High  CM  17 CM    EKG 09/09/2019 independently reviewed atrial fibrillation heart rate 100 -110 bpm old inferior MI old apical MI without acute ischemic ST changes.  His atrial fibrillation is paroxysmal and rhythm strips show sinus rhythm.  His CHA2DS2-VASc score is elevated at 7  He is frustrated and does not feel well.  One of his frustrations as he waited 8 hours to be seen in the emergency room.  Since discharge he has had no recurrent chest pain but he just feels tired and fatigued.  Today he is in sinus  rhythm.  I reviewed with him his atrial fibrillation for concern of stroke is high risk we will drop aspirin and place him on Eliquis plus clopidogrel and he is on PPI protection.  With his hospitalization with chest pain mildly elevated troponin his heart failure mitral regurgitation and paroxysmal atrial fibrillation I would like him to undergo further evaluation and advise coronary angiography his bypass was 1995.  He said he will think about it he would not make a commitment and he will come back and see me the week after he returns from [place].  He  is not having edema shortness of breath palpitation or syncope. Past Medical History:  Diagnosis Date   AAA (abdominal aortic aneurysm) ([place])    2004, Pt stated not there now   Anginal pain ([place])    Anxiety    Arthritis    BPH (benign prostatic hypertrophy)    CAD (coronary artery disease)    Carotid artery disease ([place])    Right carotid stent 2013   Claudication [facility])    Colon polyps    adenomatous   Complex sleep apnea syndrome 11/11/2017   Coronary artery disease    Depression    GERD (gastroesophageal reflux disease)    Gout    H/O hiatal hernia    Hyperlipidemia    Hypertension    Hypothyroidism    MI (myocardial infarction) ([place]) 4/14   Neuropathy    Hx: of in B/L toes   Peripheral vascular disease ([place])    with claudication   Pre-diabetes    Stroke ([place])    TIA (transient ischemic attack)    Hx: of    Past Surgical History:  Procedure Laterality Date   CAROTID STENT INSERTION Right 2013   right at [place] EXTRACTION Bilateral    Hx: of both eyes   COLONOSCOPY     Hx: of   COLONOSCOPY     CORONARY ANGIOPLASTY WITH STENT PLACEMENT  2010   CORONARY ARTERY BYPASS GRAFT  1995   CORONARY ARTERY BYPASS GRAFT N/A 10/18/2012   Procedure: REDO CORONARY ARTERY BYPASS GRAFTING (CABG);  Surgeon: Grace Isaac, MD;  Location: [place];  Service: Open Heart Surgery;  Laterality: N/A;  off pump times two using endoscopically harvested left saphenous vein   CORONARY STENT PLACEMENT  06/2016   ENDARTERECTOMY FEMORAL Right 08/03/2015   Procedure: RIGHT FEMORAL ENDARTERECTOMY WITH PATCH ANGIOPLASTY;  Surgeon: Serafina Mitchell, MD;  Location: [place];  Service: Vascular;  Laterality: Right;   FEMORAL-POPLITEAL BYPASS GRAFT Right 08/03/2015   Procedure: RIGHT FEMORAL-BELOW KNEE POPLITEAL ARTERY BYPASS GRAFT;  Surgeon: Serafina Mitchell, MD;  Location: [place];  Service: Vascular;  Laterality: Right;   FOOT SURGERY Left    FRACTURE SURGERY Left    Hx: of Left heel    INGUINAL HERNIA REPAIR Bilateral    X 2    INTRAOPERATIVE TRANSESOPHAGEAL ECHOCARDIOGRAM N/A 10/18/2012   Procedure: INTRAOPERATIVE TRANSESOPHAGEAL ECHOCARDIOGRAM;  Surgeon: Grace Isaac, MD;  Location: [place];  Service: Open Heart Surgery;  Laterality: N/A;   LEFT HEART CATHETERIZATION WITH CORONARY/GRAFT ANGIOGRAM N/A 02/02/2013   Procedure: LEFT HEART CATHETERIZATION WITH Beatrix Fetters;  Surgeon: Jacolyn Reedy, MD;  Location: [facility] CATH LAB;  Service: Cardiovascular;  Laterality: N/A;   LOWER EXTREMITY ANGIOGRAM Right 06/27/2015   Procedure: Lower Extremity Angiogram;  Surgeon: Serafina Mitchell, MD;  Location: [place] CV LAB;  Service: Cardiovascular;  Laterality: Right;   MASTOIDECTOMY  1933  PERIPHERAL VASCULAR CATHETERIZATION N/A 01/23/2015   Procedure: Abdominal Aortogram;  Surgeon: Serafina Mitchell, MD;  Location: [place] CV LAB;  Service: Cardiovascular;  Laterality: N/A;   PERIPHERAL VASCULAR CATHETERIZATION N/A 06/27/2015   Procedure: Abdominal Aortogram;  Surgeon: Serafina Mitchell, MD;  Location: [place] CV LAB;  Service: Cardiovascular;  Laterality: N/A;   RETINAL DETACHMENT SURGERY Left    Hx: of left eye   TONSILLECTOMY      Current Medications: Current Meds  Medication Sig   ACCU-CHEK AVIVA PLUS test strip    allopurinol (ZYLOPRIM) 100 MG tablet Take 100 mg by mouth daily.    amLODipine (NORVASC) 5 MG tablet Take 5 mg by mouth daily.    aspirin EC 81 MG tablet Take 81 mg by mouth daily.    Blood Glucose Monitoring Suppl (ACCU-CHEK AVIVA PLUS) w/Device KIT    clopidogrel (PLAVIX) 75 MG tablet Take 75 mg by mouth daily.   escitalopram (LEXAPRO) 10 MG tablet Take 10 mg by mouth daily.   furosemide (LASIX) 20 MG tablet TAKE 1 TABLET BY MOUTH EVERY DAY (Patient taking differently: Take 20 mg by mouth every Monday, Wednesday, and Friday. )   isosorbide mononitrate (IMDUR) 60 MG 24 hr tablet Take 1 tablet (60 mg total) by mouth daily.   lansoprazole (PREVACID)  30 MG capsule Take 30 mg by mouth daily.   levothyroxine (SYNTHROID, LEVOTHROID) 88 MCG tablet Take 88 mcg by mouth daily before breakfast.   lisinopril (ZESTRIL) 40 MG tablet Take 40 mg by mouth daily.    metoprolol succinate (TOPROL-XL) 50 MG 24 hr tablet Take 50 mg by mouth daily.   multivitamin (RENA-VIT) TABS tablet Take 1 tablet by mouth daily.   nitroGLYCERIN (NITROSTAT) 0.4 MG SL tablet    RANEXA 500 MG 12 hr tablet Take 500 mg by mouth 2 (two) times daily.    rosuvastatin (CRESTOR) 40 MG tablet Take 40 mg by mouth daily.   senna-docusate (SENOKOT-S) 8.6-50 MG tablet Take 1 tablet by mouth 2 (two) times daily between meals as needed for mild constipation.     Allergies:   Amoxicillin and Metoprolol   Social History   Socioeconomic History   Marital status: Married    Spouse name: Not on file   Number of children: 3   Years of education: Not on file   Highest education level: Not on file  Occupational History   Occupation: retired  Tobacco Use   Smoking status: Former Smoker    Types: Cigarettes    Quit date: 04/29/1975    Years since quitting: 44.4   Smokeless tobacco: Never Used  Vaping Use   Vaping Use: Never used  Substance and Sexual Activity   Alcohol use: Yes    Alcohol/week: 3.0 standard drinks    Types: 3 Glasses of wine per week    Comment: 3 glasses of wine today   Drug use: No   Sexual activity: Not Currently  Other Topics Concern   Not on file  Social History Narrative   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)   Social Determinants of Health   Financial Resource Strain:    Difficulty of Paying Living Expenses:   Food Insecurity:    Worried About Charity fundraiser in the Last Year:    Arboriculturist in the Last Year:   Transportation Needs:    Film/video editor (Medical):    Lack of Transportation (Non-Medical):   Physical Activity:    Days of Exercise per  Week:    Minutes of Exercise per Session:   Stress:    Feeling of Stress :   Social  Connections:    Frequency of Communication with Friends and Family:    Frequency of Social Gatherings with Friends and Family:    Attends Religious Services:    Active Member of Clubs or Organizations:    Attends Music therapist:    Marital Status:      Family History: The patient's family history includes Diabetes in his father; Heart attack in his mother; Heart disease in his father and mother; Hyperlipidemia in his father; Hypertension in his father; Stroke in his sister. There is no history of Colon cancer, Stomach cancer, Rectal cancer, Esophageal cancer, Liver disease, or Kidney disease. ROS:   Please see the history of present illness.    All other systems reviewed and are negative.  EKGs/Labs/Other Studies Reviewed:    The following studies were reviewed today:  EKG:  EKG ordered today and personally reviewed.  The ekg ordered today demonstrates sinus bradycardia 54 bpm nonspecific T waves old inferior MI  Recent Labs: 09/10/2019: ALT 26; B Natriuretic Peptide 284.4 09/12/2019: BUN 20; Creatinine, Ser 1.09; Hemoglobin 12.4; Magnesium 1.9; Platelets 158; Potassium 4.1; Sodium 141  Recent Lipid Panel    Component Value Date/Time   CHOL  04/18/2009 0241    109        ATP III CLASSIFICATION:  <200     mg/dL   Desirable  200-239  mg/dL   Borderline High  >=240    mg/dL   High          TRIG 114 04/18/2009 0241   HDL 34 (L) 04/18/2009 0241   CHOLHDL 3.2 04/18/2009 0241   VLDL 23 04/18/2009 0241   LDLCALC  04/18/2009 0241    52        Total Cholesterol/HDL:CHD Risk Coronary Heart Disease Risk Table                     Men   Women  1/2 Average Risk   3.4   3.3  Average Risk       5.0   4.4  2 X Average Risk   9.6   7.1  3 X Average Risk  23.4   11.0        Use the calculated Patient Ratio above and the CHD Risk Table to determine the patient's CHD Risk.        ATP III CLASSIFICATION (LDL):  <100     mg/dL   Optimal  100-129  mg/dL   Near or Above                     Optimal  130-159  mg/dL   Borderline  160-189  mg/dL   High  >190     mg/dL   Very High    Physical Exam:    VS:  BP (!) 112/58   Pulse (!) 56   Ht _0  (1.803 m)   Wt 160 lb (72.6 kg)   SpO2 95%   BMI 22.32 kg/m     Wt Readings from Last 3 Encounters:  10/07/19 160 lb (72.6 kg)  09/12/19 153 lb 12.8 oz (69.8 kg)  04/12/19 157 lb (71.2 kg)     GEN:  Well nourished, well developed in no acute distress HEENT: Normal NECK: No JVD; No carotid bruits LYMPHATICS: No lymphadenopathy CARDIAC: Grade 1/6 to 2/6 MR RRR,  no murmurs, rubs, gallops RESPIRATORY:  Clear to auscultation without rales, wheezing or rhonchi  ABDOMEN: Soft, non-tender, non-distended MUSCULOSKELETAL:  No edema; No deformity  SKIN: Warm and dry NEUROLOGIC:  Alert and oriented x 3 PSYCHIATRIC:  Normal affect    Signed, Shirlee More, MD  10/07/2019 1:46 PM    [facility]

## 2019-10-07 ENCOUNTER — Ambulatory Visit (INDEPENDENT_AMBULATORY_CARE_PROVIDER_SITE_OTHER): Payer: Medicare HMO

## 2019-10-07 ENCOUNTER — Other Ambulatory Visit: Payer: Self-pay

## 2019-10-07 ENCOUNTER — Encounter: Payer: Self-pay | Admitting: Cardiology

## 2019-10-07 ENCOUNTER — Ambulatory Visit: Payer: Medicare HMO | Admitting: Cardiology

## 2019-10-07 VITALS — BP 112/58 | HR 56 | Ht 71.0 in | Wt 160.0 lb

## 2019-10-07 DIAGNOSIS — I5032 Chronic diastolic (congestive) heart failure: Secondary | ICD-10-CM | POA: Diagnosis not present

## 2019-10-07 DIAGNOSIS — I110 Hypertensive heart disease with heart failure: Secondary | ICD-10-CM

## 2019-10-07 DIAGNOSIS — E782 Mixed hyperlipidemia: Secondary | ICD-10-CM

## 2019-10-07 DIAGNOSIS — I480 Paroxysmal atrial fibrillation: Secondary | ICD-10-CM

## 2019-10-07 DIAGNOSIS — I340 Nonrheumatic mitral (valve) insufficiency: Secondary | ICD-10-CM

## 2019-10-07 DIAGNOSIS — I25118 Atherosclerotic heart disease of native coronary artery with other forms of angina pectoris: Secondary | ICD-10-CM | POA: Diagnosis not present

## 2019-10-07 MED ORDER — APIXABAN 5 MG PO TABS: 5.0000 mg | ORAL_TABLET | Freq: Two times a day (BID) | ORAL | 3 refills | Status: DC

## 2019-10-07 NOTE — Patient Instructions (Signed)
Medication Instructions:  Your physician has recommended you make the following change in your medication:  STOP: Aspirin START: Eliquis 5 mg take one tablet by mouth twice daily.  *If you need a refill on your cardiac medications before your next appointment, please call your pharmacy*   Lab Work: None If you have labs (blood work) drawn today and your tests are completely normal, you will receive your results only by: Marland Kitchen MyChart Message (if you have MyChart) OR . A paper copy in the mail If you have any lab test that is abnormal or we need to change your treatment, we will call you to review the results.   Testing/Procedures: A zio monitor was ordered today. It will remain on for 7 days. You will then return monitor and event diary in provided box. It takes 1-2 weeks for report to be downloaded and returned to [place]. We will call you with the results. If monitor falls off or has orange flashing light, please call Zio for further instructions.      Follow-Up: At [facility], you and your health needs are our priority.  As part of our continuing mission to provide you with exceptional heart care, we have created designated Provider Care Teams.  These Care Teams include your primary Cardiologist (physician) and Advanced Practice Providers (APPs -  Physician Assistants and Nurse Practitioners) who all work together to provide you with the care you need, when you need it.  We recommend signing up for the patient portal called "MyChart".  Sign up information is provided on this After Visit Summary.  MyChart is used to connect with patients for Virtual Visits (Telemedicine).  Patients are able to view lab/test results, encounter notes, upcoming appointments, etc.  Non-urgent messages can be sent to your provider as well.   To learn more about what you can do with MyChart, go to [email or web address].    Your next appointment:   11/11/2019 8:40 AM  The format for your next appointment:   In  Person  Provider:   Shirlee More, MD   Other Instructions

## 2019-10-11 DIAGNOSIS — M25512 Pain in left shoulder: Secondary | ICD-10-CM | POA: Diagnosis not present

## 2019-10-12 ENCOUNTER — Other Ambulatory Visit: Payer: Self-pay

## 2019-10-12 ENCOUNTER — Encounter (HOSPITAL_BASED_OUTPATIENT_CLINIC_OR_DEPARTMENT_OTHER): Payer: Self-pay | Admitting: Emergency Medicine

## 2019-10-12 ENCOUNTER — Emergency Department (HOSPITAL_BASED_OUTPATIENT_CLINIC_OR_DEPARTMENT_OTHER)
Admission: EM | Admit: 2019-10-12 | Discharge: 2019-10-12 | Disposition: A | Discharge status: Home / Self Care | Payer: Medicare HMO | Attending: Emergency Medicine | Admitting: Emergency Medicine

## 2019-10-12 ENCOUNTER — Emergency Department (HOSPITAL_BASED_OUTPATIENT_CLINIC_OR_DEPARTMENT_OTHER): Payer: Medicare HMO

## 2019-10-12 ENCOUNTER — Telehealth: Payer: Self-pay | Admitting: Cardiology

## 2019-10-12 DIAGNOSIS — Z79899 Other long term (current) drug therapy: Secondary | ICD-10-CM | POA: Insufficient documentation

## 2019-10-12 DIAGNOSIS — I209 Angina pectoris, unspecified: Secondary | ICD-10-CM | POA: Diagnosis not present

## 2019-10-12 DIAGNOSIS — Z951 Presence of aortocoronary bypass graft: Secondary | ICD-10-CM | POA: Diagnosis not present

## 2019-10-12 DIAGNOSIS — I5032 Chronic diastolic (congestive) heart failure: Secondary | ICD-10-CM | POA: Insufficient documentation

## 2019-10-12 DIAGNOSIS — N1831 Chronic kidney disease, stage 3a: Secondary | ICD-10-CM | POA: Insufficient documentation

## 2019-10-12 DIAGNOSIS — J9811 Atelectasis: Secondary | ICD-10-CM | POA: Diagnosis not present

## 2019-10-12 DIAGNOSIS — I130 Hypertensive heart and chronic kidney disease with heart failure and stage 1 through stage 4 chronic kidney disease, or unspecified chronic kidney disease: Secondary | ICD-10-CM | POA: Diagnosis not present

## 2019-10-12 DIAGNOSIS — R0789 Other chest pain: Secondary | ICD-10-CM | POA: Diagnosis not present

## 2019-10-12 DIAGNOSIS — Z95828 Presence of other vascular implants and grafts: Secondary | ICD-10-CM | POA: Insufficient documentation

## 2019-10-12 DIAGNOSIS — Z87891 Personal history of nicotine dependence: Secondary | ICD-10-CM | POA: Insufficient documentation

## 2019-10-12 DIAGNOSIS — Z7901 Long term (current) use of anticoagulants: Secondary | ICD-10-CM | POA: Diagnosis not present

## 2019-10-12 DIAGNOSIS — R7303 Prediabetes: Secondary | ICD-10-CM | POA: Diagnosis not present

## 2019-10-12 DIAGNOSIS — I208 Other forms of angina pectoris: Secondary | ICD-10-CM | POA: Insufficient documentation

## 2019-10-12 DIAGNOSIS — R002 Palpitations: Secondary | ICD-10-CM | POA: Diagnosis not present

## 2019-10-12 DIAGNOSIS — I10 Essential (primary) hypertension: Secondary | ICD-10-CM | POA: Diagnosis not present

## 2019-10-12 DIAGNOSIS — E039 Hypothyroidism, unspecified: Secondary | ICD-10-CM | POA: Diagnosis not present

## 2019-10-12 DIAGNOSIS — I517 Cardiomegaly: Secondary | ICD-10-CM | POA: Diagnosis not present

## 2019-10-12 LAB — CBC WITH DIFFERENTIAL/PLATELET
Abs Immature Granulocytes: 0.08 10*3/uL — ABNORMAL HIGH (ref 0.00–0.07)
Basophils Absolute: 0 10*3/uL (ref 0.0–0.1)
Basophils Relative: 0 %
Eosinophils Absolute: 0 10*3/uL (ref 0.0–0.5)
Eosinophils Relative: 0 %
HCT: 39.3 % (ref 39.0–52.0)
Hemoglobin: 12.9 g/dL — ABNORMAL LOW (ref 13.0–17.0)
Immature Granulocytes: 1 %
Lymphocytes Relative: 6 %
Lymphs Abs: 0.7 10*3/uL (ref 0.7–4.0)
MCH: 31.9 pg (ref 26.0–34.0)
MCHC: 32.8 g/dL (ref 30.0–36.0)
MCV: 97.3 fL (ref 80.0–100.0)
Monocytes Absolute: 0.3 10*3/uL (ref 0.1–1.0)
Monocytes Relative: 3 %
Neutro Abs: 10.2 10*3/uL — ABNORMAL HIGH (ref 1.7–7.7)
Neutrophils Relative %: 90 %
Platelets: 183 10*3/uL (ref 150–400)
RBC: 4.04 MIL/uL — ABNORMAL LOW (ref 4.22–5.81)
RDW: 13.2 % (ref 11.5–15.5)
WBC: 11.4 10*3/uL — ABNORMAL HIGH (ref 4.0–10.5)
nRBC: 0 % (ref 0.0–0.2)

## 2019-10-12 LAB — COMPREHENSIVE METABOLIC PANEL
ALT: 21 U/L (ref 0–44)
AST: 24 U/L (ref 15–41)
Albumin: 3.7 g/dL (ref 3.5–5.0)
Alkaline Phosphatase: 34 U/L — ABNORMAL LOW (ref 38–126)
Anion gap: 11 (ref 5–15)
BUN: 37 mg/dL — ABNORMAL HIGH (ref 8–23)
CO2: 21 mmol/L — ABNORMAL LOW (ref 22–32)
Calcium: 9 mg/dL (ref 8.9–10.3)
Chloride: 104 mmol/L (ref 98–111)
Creatinine, Ser: 1.43 mg/dL — ABNORMAL HIGH (ref 0.61–1.24)
GFR calc Af Amer: 50 mL/min — ABNORMAL LOW (ref >60)
GFR calc non Af Amer: 43 mL/min — ABNORMAL LOW (ref >60)
Glucose, Bld: 193 mg/dL — ABNORMAL HIGH (ref 70–99)
Potassium: 4.4 mmol/L (ref 3.5–5.1)
Sodium: 136 mmol/L (ref 135–145)
Total Bilirubin: 0.5 mg/dL (ref 0.3–1.2)
Total Protein: 7.6 g/dL (ref 6.5–8.1)

## 2019-10-12 LAB — TROPONIN I (HIGH SENSITIVITY)
Troponin I (High Sensitivity): 22 ng/L — ABNORMAL HIGH (ref <18)
Troponin I (High Sensitivity): 25 ng/L — ABNORMAL HIGH (ref <18)

## 2019-10-12 NOTE — Discharge Instructions (Signed)
1.  Continue your regular medications. 2.  If your heart rate is greater than 80 in your blood pressure is greater than 130/70, you may take an additional 1/2 tablet of your Toprol-XL.  Measure and document your blood pressure and heart rate to make sure that they are not getting too low.  Take these results to your cardiologist or family doctor to review possible increases in your Toprol dosing. 3.  Call Dr. Bettina Gavia to schedule follow-up appointment.  At this time, your chest pressure appears to be stable angina.  You have had no elevation in your troponin and vital signs are normal.  You do have known extensive heart disease.  If your doctor feels that you need repeat cardiac catheterization, this should be scheduled ASAP.  If you have recurrence of chest pain that is worsening or not improving with nitroglycerin, return to the emergency department immediately.

## 2019-10-12 NOTE — ED Provider Notes (Signed)
[facility] EMERGENCY DEPARTMENT Provider Note   CSN: [id number] Arrival date & time: 10/12/19  1024     History Chief Complaint  Patient presents with  . Tachycardia  . Dizziness    ARCH METHOT is a 86 y.o. male.  HPI Patient reports that he felt well yesterday.  He did usual activities without difficulty.  No chest pain or palpitations or lightheadedness.  Last night when he went to sleep, he started to notice he was getting a palpitation every several beats.  Sometimes it would be every fifth beat he would get a palpitation and other times every third.  Then at one point he started to feel that his heart was racing.  Patient began to monitor his heart rate and reports it had been running in the 80s.  He reports typically for a number of years, his heart rate is in the 50s or 60s.  He does take metoprolol daily.  He reports he was supposed to get a dose increase about a week ago but his blood pressures were running low and therefore his dose was not increased.  Patient has been placed on Eliquis for paroxysmal atrial fibrillation.  He denies he has any chest pain.  He did feel a vague sensation of fullness.  Patient tried 3 nitroglycerin with some improvement in fullness but no real change in terms of his perception of palpitations.  He denies any associated shortness of breath.  He did not feel that he was in a pass out or have any near syncopal episodes.  Patient is currently wearing a Zio patch.      Past Medical History:  Diagnosis Date  . AAA (abdominal aortic aneurysm) ([place])    2004, Pt stated not there now  . Anginal pain ([place])   . Anxiety   . Arthritis   . BPH (benign prostatic hypertrophy)   . CAD (coronary artery disease)   . Carotid artery disease ([place])    Right carotid stent 2013  . Claudication ([place])   . Colon polyps    adenomatous  . Complex sleep apnea syndrome 11/11/2017  . Coronary artery disease   . Depression   . GERD (gastroesophageal reflux  disease)   . Gout   . H/O hiatal hernia   . Hyperlipidemia   . Hypertension   . Hypothyroidism   . MI (myocardial infarction) ([place]) 4/14  . Neuropathy    Hx: of in B/L toes  . Peripheral vascular disease ([place])    with claudication  . Pre-diabetes   . Stroke ([place])   . TIA (transient ischemic attack)    Hx: of    Patient Active Problem List   Diagnosis Date Noted  . Chronic kidney disease, stage 3a 09/10/2019  . Chronic diastolic (congestive) heart failure ([place]) 09/10/2019  . Unstable angina ([place]) 09/10/2019  . RBBB 01/12/2019  . Bradycardia 01/12/2019  . Essential hypertension 01/12/2019  . Coronary artery disease involving native heart with unstable angina pectoris ([place])   . Hypertension   . OSA on CPAP 02/17/2018  . Complex sleep apnea syndrome 11/11/2017  . Bradycardia on ECG 11/11/2017  . Malnutrition of moderate degree 08/05/2015  . PAD (peripheral artery disease) ([place]) 08/03/2015  . Atherosclerotic PVD with intermittent claudication ([employer]) 02/20/2014  . Atrial fibrillation with rapid ventricular response ([place])   . Hypothyroidism   . CAD (coronary artery disease) 09/06/2012  . s/p CABG   . Mixed hyperlipidemia   . Hypertension, renal disease, stage 1-4 or  unspecified chronic kidney disease   . Gout   . 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 Carotid artery disease without cerebral infarction [facility])     Past Surgical History:  Procedure Laterality Date  . CAROTID STENT INSERTION Right 2013   right at [facility]  . CATARACT EXTRACTION Bilateral    Hx: of both eyes  . COLONOSCOPY     Hx: of  . COLONOSCOPY    . CORONARY ANGIOPLASTY WITH STENT PLACEMENT  2010  . CORONARY ARTERY BYPASS GRAFT  1995  . CORONARY ARTERY BYPASS GRAFT N/A 10/18/2012   Procedure: REDO CORONARY ARTERY BYPASS GRAFTING (CABG);  Surgeon: Grace Isaac, MD;  Location: [facility];  Service: Open Heart Surgery;  Laterality: N/A;  off  pump times two using endoscopically harvested left saphenous vein  . CORONARY STENT PLACEMENT  06/2016  . ENDARTERECTOMY FEMORAL Right 08/03/2015   Procedure: RIGHT FEMORAL ENDARTERECTOMY WITH PATCH ANGIOPLASTY;  Surgeon: Serafina Mitchell, MD;  Location: [place];  Service: Vascular;  Laterality: Right;  . FEMORAL-POPLITEAL BYPASS GRAFT Right 08/03/2015   Procedure: RIGHT FEMORAL-BELOW KNEE POPLITEAL ARTERY BYPASS GRAFT;  Surgeon: Serafina Mitchell, MD;  Location: [place];  Service: Vascular;  Laterality: Right;  . FOOT SURGERY Left   . FRACTURE SURGERY Left    Hx: of Left heel  . INGUINAL HERNIA REPAIR Bilateral    X 2   . INTRAOPERATIVE TRANSESOPHAGEAL ECHOCARDIOGRAM N/A 10/18/2012   Procedure: INTRAOPERATIVE TRANSESOPHAGEAL ECHOCARDIOGRAM;  Surgeon: Grace Isaac, MD;  Location: [facility];  Service: Open Heart Surgery;  Laterality: N/A;  . LEFT HEART CATHETERIZATION WITH CORONARY/GRAFT ANGIOGRAM N/A 02/02/2013   Procedure: LEFT HEART CATHETERIZATION WITH Beatrix Fetters;  Surgeon: Jacolyn Reedy, MD;  Location: [facility] CATH LAB;  Service: Cardiovascular;  Laterality: N/A;  . LOWER EXTREMITY ANGIOGRAM Right 06/27/2015   Procedure: Lower Extremity Angiogram;  Surgeon: Serafina Mitchell, MD;  Location: [place] CV LAB;  Service: Cardiovascular;  Laterality: Right;  . MASTOIDECTOMY  1933  . PERIPHERAL VASCULAR CATHETERIZATION N/A 01/23/2015   Procedure: Abdominal Aortogram;  Surgeon: Serafina Mitchell, MD;  Location: [place] CV LAB;  Service: Cardiovascular;  Laterality: N/A;  . PERIPHERAL VASCULAR CATHETERIZATION N/A 06/27/2015   Procedure: Abdominal Aortogram;  Surgeon: Serafina Mitchell, MD;  Location: [place] CV LAB;  Service: Cardiovascular;  Laterality: N/A;  . RETINAL DETACHMENT SURGERY Left    Hx: of left eye  . TONSILLECTOMY         Family History  Problem Relation Age of Onset  . Heart disease Mother        After 62 yrs of age  . Heart attack Mother   . Hyperlipidemia Father   .  Hypertension Father   . Heart disease Father        After 71 yrs of age  . Diabetes Father   . Stroke Sister   . Colon cancer Neg Hx   . Stomach cancer Neg Hx   . Rectal cancer Neg Hx   . Esophageal cancer Neg Hx   . Liver disease Neg Hx   . Kidney disease Neg Hx     Social History   Tobacco Use  . Smoking status: Former Smoker    Types: Cigarettes    Quit date: 04/29/1975    Years since quitting: 44.4  . Smokeless tobacco: Never Used  Vaping Use  . Vaping Use: Never used  Substance Use Topics  . Alcohol use:  Yes    Alcohol/week: 3.0 standard drinks    Types: 3 Glasses of wine per week    Comment: 3 glasses of wine today  . Drug use: No    Home Medications Prior to Admission medications   Medication Sig Start Date End Date Taking? Authorizing Provider  ACCU-CHEK AVIVA PLUS test strip  11/17/18   [provider]  allopurinol (ZYLOPRIM) 100 MG tablet Take 100 mg by mouth daily.     [provider]  amLODipine (NORVASC) 5 MG tablet Take 5 mg by mouth daily.  05/12/07   [provider]  apixaban (ELIQUIS) 5 MG TABS tablet Take 1 tablet (5 mg total) by mouth 2 (two) times daily. 10/07/19   Richardo Priest, MD  Blood Glucose Monitoring Suppl (ACCU-CHEK AVIVA PLUS) w/Device KIT  11/17/18   [provider]  clopidogrel (PLAVIX) 75 MG tablet Take 75 mg by mouth daily.    [provider]  escitalopram (LEXAPRO) 10 MG tablet Take 10 mg by mouth daily. 07/17/18   [provider]  furosemide (LASIX) 20 MG tablet TAKE 1 TABLET BY MOUTH EVERY DAY Patient taking differently: Take 20 mg by mouth every Monday, Wednesday, and Friday.  02/07/19   Richardo Priest, MD  isosorbide mononitrate (IMDUR) 60 MG 24 hr tablet Take 1 tablet (60 mg total) by mouth daily. 09/12/19   Mercy Riding, MD  lansoprazole (PREVACID) 30 MG capsule Take 30 mg by mouth daily. 08/16/19   [provider]  levothyroxine (SYNTHROID, LEVOTHROID) 88 MCG tablet Take 88  mcg by mouth daily before breakfast.    [provider]  lisinopril (ZESTRIL) 40 MG tablet Take 40 mg by mouth daily.  03/31/19   [provider]  metoprolol succinate (TOPROL-XL) 50 MG 24 hr tablet Take 50 mg by mouth daily.    [provider]  multivitamin (RENA-VIT) TABS tablet Take 1 tablet by mouth daily.    [provider]  nitroGLYCERIN (NITROSTAT) 0.4 MG SL tablet  09/12/19   [provider]  RANEXA 500 MG 12 hr tablet Take 500 mg by mouth 2 (two) times daily.  08/08/16   [provider]  rosuvastatin (CRESTOR) 40 MG tablet Take 40 mg by mouth daily.    [provider]  senna-docusate (SENOKOT-S) 8.6-50 MG tablet Take 1 tablet by mouth 2 (two) times daily between meals as needed for mild constipation. 09/12/19   Mercy Riding, MD    Allergies    Amoxicillin and Metoprolol  Review of Systems   Review of Systems 10 Systems reviewed and are negative for acute change except as noted in the HPI.  Physical Exam Updated Vital Signs BP (!) 136/57   Pulse 88   Temp 98.7 F (37.1 C) (Oral)   Resp (!) 25   SpO2 100%   Physical Exam Constitutional:      Appearance: He is well-developed.  HENT:     Head: Normocephalic and atraumatic.  Eyes:     Extraocular Movements: Extraocular movements intact.  Cardiovascular:     Rate and Rhythm: Normal rate and regular rhythm.     Heart sounds: Normal heart sounds.  Pulmonary:     Effort: Pulmonary effort is normal.     Breath sounds: Normal breath sounds.  Abdominal:     General: Bowel sounds are normal. There is no distension.     Palpations: Abdomen is soft.     Tenderness: There is no abdominal tenderness.  Musculoskeletal:  General: Normal range of motion.     Cervical back: Neck supple.  Skin:    General: Skin is warm and dry.  Neurological:     Mental Status: He is alert and oriented to person, place, and time.     GCS: GCS eye subscore is 4. GCS verbal subscore  is 5. GCS motor subscore is 6.     Coordination: Coordination normal.     ED Results / Procedures / Treatments   Labs (all labs ordered are listed, but only abnormal results are displayed) Labs Reviewed  CBC WITH DIFFERENTIAL/PLATELET - Abnormal; Notable for the following components:      Result Value   WBC 11.4 (*)    RBC 4.04 (*)    Hemoglobin 12.9 (*)    Neutro Abs 10.2 (*)    Abs Immature Granulocytes 0.08 (*)    All other components within normal limits  COMPREHENSIVE METABOLIC PANEL - Abnormal; Notable for the following components:   CO2 21 (*)    Glucose, Bld 193 (*)    BUN 37 (*)    Creatinine, Ser 1.43 (*)    Alkaline Phosphatase 34 (*)    GFR calc non Af Amer 43 (*)    GFR calc Af Amer 50 (*)    All other components within normal limits  TROPONIN I (HIGH SENSITIVITY) - Abnormal; Notable for the following components:   Troponin I (High Sensitivity) 22 (*)    All other components within normal limits  TROPONIN I (HIGH SENSITIVITY) - Abnormal; Notable for the following components:   Troponin I (High Sensitivity) 25 (*)    All other components within normal limits    EKG 16\6\2021 10: 33: 19 EKG did not uploaded to MUSE. Sinus rhythm 78 PR 197 QTc 450 No acute ischemic changes.  No significant change when compared to EKG from 11\6\2021  Radiology DG Chest Portable 1 View  Result Date: 10/12/2019 CLINICAL DATA:  Chest tightness. EXAM: PORTABLE CHEST 1 VIEW COMPARISON:  09/09/2019 FINDINGS: Electronic device projects over the LEFT chest likely patient monitor similar device. Post median sternotomy for CABG. Heart size remains enlarged. Calcific atheromatous plaque in the thoracic aortic arch. Slight increased opacity over the LEFT hemidiaphragm appears to represent interstitial thickening similar to Sep 09, 2019. Lungs are otherwise clear. No sign of pleural effusion. Visualized skeletal structures on limited assessment are unremarkable. IMPRESSION: 1. Chronic  interstitial thickening at the LEFT lung base potentially associated with atelectasis. 2. Cardiomegaly post CABG. Electronically Signed   By: Zetta Bills M.D.   On: 10/12/2019 10:53    Procedures Procedures (including critical care time)  Medications Ordered in ED Medications - No data to display  ED Course  I have reviewed the triage vital signs and the nursing notes.  Pertinent labs & imaging results that were available during my care of the patient were reviewed by me and considered in my medical decision making (see chart for details).    MDM Rules/Calculators/A&P                          Patient presents as outlined with palpitations that he noticed after going to bed.  Also he began monitoring his heart rate and noting that it was in the 80s.  Patient was concerned because this is higher than what has been typical for him.  At this time, patient's vital signs are normal.  He is normotensive with blood pressures in the 130s/70s and heart  rates in the 70s sinus rhythm.  Troponins are flat.  Patient is on Eliquis for known history of A. fib.  It is possible he was having paroxysmal atrial fibrillation however at this time he is rate controlled and in sinus rhythm.  Patient described very mild and vague pressure sensation for which he took nitroglycerin.  That has now resolved.  I reviewed the nature of stable angina with the patient and his wife.  Patient's wife reports that Dr. Bettina Gavia said the patient should have a cardiac catheterization repeated.  I have encouraged him to call Dr. Bettina Gavia ASAP for follow-up and determination if based on the patient's history of severe coronary artery disease with multiple stents and episode of stable angina, he feels that catheterization should be scheduled.  At this time I feel patient is stable with discharge with maximally medically treated coronary artery disease, rate controlled with stable vital signs. Final Clinical Impression(s) / ED  Diagnoses Final diagnoses:  Palpitations  Stable angina [facility])    Rx / DC Orders ED Discharge Orders    None       Charlesetta Shanks, MD 10/12/19 1555

## 2019-10-12 NOTE — ED Notes (Signed)
Chest tightness onset this am

## 2019-10-12 NOTE — Telephone Encounter (Signed)
0930 BP 161/78 HR 91. Pt states that he is having chest tightness but has not taken a nitroglycerin. Pt is going to take a NTG now. Pt states that he took all of his medication approximately 1 hour ago. Denies nausea or vomiting, diaphoresis or shortness of breath. Tightness is 4/10  0935 Pt states that the tightness has improved with 1 NTG. BP is 159/78 HR 83. 0937 Took 2nd NTG 0940 BP 143/74 HR 85 still c/o chest tightness 2/10. 0942 No change in tightness. Pt states that his tightness is not bad but still there. Pt states that he can still feel his heart pounding. BP157/75 HR 84 0944 3rd NTG taken. Tightness 2/10. 0945 BP 160/78 HR 84. 0949 chest tightness is 2/10 156/76 HR 85.  Pt advised to go to the ED. Pt verbalized understanding and had no additional questions.

## 2019-10-12 NOTE — Telephone Encounter (Signed)
New Message   STAT if HR is under 50 or over 120 (normal HR is 60-100 beats per minute)  1) What is your heart rate? Resting HR 81   2) Do you have a log of your heart rate readings (document readings)? No, pt just uses his BP machine   3) Do you have any other symptoms? Pt says his HR usually is in 50-60's. Pt says he felt weak this morning, right now he feels a little tightness in his chest. He says he woke up in the night and could feel his heart beating fast. Pt says he has been experiencing the tightness in his chest for about 10 minutes now     BP    143/65  Hr 81  143/67 hr 61

## 2019-10-12 NOTE — ED Notes (Signed)
Pt on monitor 

## 2019-10-12 NOTE — ED Triage Notes (Signed)
Pt here with very remarkable cardiac hx with tachycardia and light headedness since yesterday.

## 2019-10-13 ENCOUNTER — Telehealth: Payer: Self-pay | Admitting: Cardiology

## 2019-10-13 NOTE — Telephone Encounter (Signed)
Patient is requesting to speak with the nurse who put on his heart monitor. He states he just has some questions about the monitor he needs to ask.

## 2019-10-13 NOTE — Telephone Encounter (Signed)
Patient states that a light started blinking on his monitor. He states that he called the [facility] and was told to go ahead and send the monitor back in which he did this morning. He was able to wear it for right at 4 1/2 days. He just wanted to make [place] aware of this.

## 2019-10-17 ENCOUNTER — Ambulatory Visit (HOSPITAL_COMMUNITY)
Admission: RE | Admit: 2019-10-17 | Discharge: 2019-10-17 | Disposition: A | Discharge status: Home / Self Care | Payer: Medicare HMO | Source: Ambulatory Visit | Attending: Vascular Surgery | Admitting: Vascular Surgery

## 2019-10-17 ENCOUNTER — Other Ambulatory Visit: Payer: Self-pay

## 2019-10-17 ENCOUNTER — Ambulatory Visit: Payer: Medicare HMO | Admitting: Physician Assistant

## 2019-10-17 ENCOUNTER — Ambulatory Visit (INDEPENDENT_AMBULATORY_CARE_PROVIDER_SITE_OTHER)
Admission: RE | Admit: 2019-10-17 | Discharge: 2019-10-17 | Disposition: A | Discharge status: Home / Self Care | Payer: Medicare HMO | Source: Ambulatory Visit | Attending: Vascular Surgery | Admitting: Vascular Surgery

## 2019-10-17 VITALS — BP 116/60 | HR 59 | Temp 97.5°F | Resp 20 | Ht 71.0 in | Wt 157.6 lb

## 2019-10-17 DIAGNOSIS — I209 Angina pectoris, unspecified: Secondary | ICD-10-CM | POA: Diagnosis not present

## 2019-10-17 DIAGNOSIS — E871 Hypo-osmolality and hyponatremia: Secondary | ICD-10-CM | POA: Diagnosis not present

## 2019-10-17 DIAGNOSIS — I7025 Atherosclerosis of native arteries of other extremities with ulceration: Secondary | ICD-10-CM

## 2019-10-17 DIAGNOSIS — R079 Chest pain, unspecified: Secondary | ICD-10-CM | POA: Diagnosis not present

## 2019-10-17 DIAGNOSIS — I480 Paroxysmal atrial fibrillation: Secondary | ICD-10-CM | POA: Diagnosis not present

## 2019-10-17 DIAGNOSIS — I6523 Occlusion and stenosis of bilateral carotid arteries: Secondary | ICD-10-CM | POA: Diagnosis not present

## 2019-10-17 DIAGNOSIS — R5383 Other fatigue: Secondary | ICD-10-CM | POA: Diagnosis not present

## 2019-10-17 DIAGNOSIS — I739 Peripheral vascular disease, unspecified: Secondary | ICD-10-CM | POA: Diagnosis not present

## 2019-10-17 DIAGNOSIS — E1151 Type 2 diabetes mellitus with diabetic peripheral angiopathy without gangrene: Secondary | ICD-10-CM | POA: Diagnosis not present

## 2019-10-17 DIAGNOSIS — Z9861 Coronary angioplasty status: Secondary | ICD-10-CM | POA: Diagnosis not present

## 2019-10-17 DIAGNOSIS — I129 Hypertensive chronic kidney disease with stage 1 through stage 4 chronic kidney disease, or unspecified chronic kidney disease: Secondary | ICD-10-CM | POA: Diagnosis not present

## 2019-10-17 DIAGNOSIS — J019 Acute sinusitis, unspecified: Secondary | ICD-10-CM | POA: Diagnosis not present

## 2019-10-17 NOTE — Progress Notes (Signed)
Office Note     CC:  follow up Requesting Provider:  Marton Redwood, MD  HPI: Ian Cummings is a 86 y.o. (1928-11-27) male who presents for follow up of peripheral vascular disease and carotid stenosis. He is s/p right carotid stenting performed at [facility] in 2013. He is also status post right external iliac, common femoral, and profunda femoral artery endarterectomy with bovine pericardial patch angioplasty, followed by a right common femoral to below knee popliteal artery bypass graft with 6 mm Gore-Tex 08/03/2015 by Dr. Trula Slade for a right heel ulcer and a right great toe ulcer, both of which have healed.   Today he presents for follow up with non invasive vascular studies. He denies any new neurological symptoms. He denies any amaurosis or other visual changes, any slurred speech, fascial weakness or numbness, unilateral upper or lower extremity weakness or numbness, headaches, dizziness or confusion. He does state that over the last several weeks he has not been feeling right. He was just seen in the [facility] ED on 6/16/ for angina. He describes feeling very fatigued and wanting to just sleep all day. He says he has an appetite but that he has lost several lbs over past week and a half. He denies any nausea, vomiting, changes in bowel movements, fever or chills. He is scheduled to see his PCP later this afternoon  He does report new onset left leg and foot pain which he says has been present for approximately 1 month. He has a cramping/ tightening feeling in his left calf on prolonged standing. He has pain in his left heel which he says feels like there is a ulcer there but there is no ulcer and also some shooting pains into his left second toe. These pains do not wake him up at night. He says he does not notice this worsening on ambulation because he does not walk much due to discomfort in his hips. He otherwise denies any rest pain or non healing wounds.  The pt is on a statin for cholesterol  management.  The pt not on a daily aspirin.   Other AC: Eliquis, Plavix The pt is on CCB and ACE for hypertension.   The pt is diabetic.  Tobacco hx: former smoker, 1977  Past Medical History:  Diagnosis Date  . AAA (abdominal aortic aneurysm) ([place])    2004, Pt stated not there now  . Anginal pain ([place])   . Anxiety   . Arthritis   . BPH (benign prostatic hypertrophy)   . CAD (coronary artery disease)   . Carotid artery disease ([place])    Right carotid stent 2013  . Claudication ([place])   . Colon polyps    adenomatous  . Complex sleep apnea syndrome 11/11/2017  . Coronary artery disease   . Depression   . GERD (gastroesophageal reflux disease)   . Gout   . H/O hiatal hernia   . Hyperlipidemia   . Hypertension   . Hypothyroidism   . MI (myocardial infarction) ([place]) 4/14  . Neuropathy    Hx: of in B/L toes  . Peripheral vascular disease ([place])    with claudication  . Pre-diabetes   . Stroke ([place])   . TIA (transient ischemic attack)    Hx: of    Past Surgical History:  Procedure Laterality Date  . CAROTID STENT INSERTION Right 2013   right at [facility]  . CATARACT EXTRACTION Bilateral    Hx: of both eyes  . COLONOSCOPY  Hx: of  . COLONOSCOPY    . CORONARY ANGIOPLASTY WITH STENT PLACEMENT  2010  . CORONARY ARTERY BYPASS GRAFT  1995  . CORONARY ARTERY BYPASS GRAFT N/A 10/18/2012   Procedure: REDO CORONARY ARTERY BYPASS GRAFTING (CABG);  Surgeon: Grace Isaac, MD;  Location: [place];  Service: Open Heart Surgery;  Laterality: N/A;  off pump times two using endoscopically harvested left saphenous vein  . CORONARY STENT PLACEMENT  06/2016  . ENDARTERECTOMY FEMORAL Right 08/03/2015   Procedure: RIGHT FEMORAL ENDARTERECTOMY WITH PATCH ANGIOPLASTY;  Surgeon: Serafina Mitchell, MD;  Location: [place];  Service: Vascular;  Laterality: Right;  . FEMORAL-POPLITEAL BYPASS GRAFT Right 08/03/2015   Procedure: RIGHT FEMORAL-BELOW KNEE POPLITEAL ARTERY BYPASS GRAFT;  Surgeon: Serafina Mitchell, MD;  Location: [place];  Service: Vascular;  Laterality: Right;  . FOOT SURGERY Left   . FRACTURE SURGERY Left    Hx: of Left heel  . INGUINAL HERNIA REPAIR Bilateral    X 2   . INTRAOPERATIVE TRANSESOPHAGEAL ECHOCARDIOGRAM N/A 10/18/2012   Procedure: INTRAOPERATIVE TRANSESOPHAGEAL ECHOCARDIOGRAM;  Surgeon: Grace Isaac, MD;  Location: [place];  Service: Open Heart Surgery;  Laterality: N/A;  . LEFT HEART CATHETERIZATION WITH CORONARY/GRAFT ANGIOGRAM N/A 02/02/2013   Procedure: LEFT HEART CATHETERIZATION WITH Beatrix Fetters;  Surgeon: Jacolyn Reedy, MD;  Location: [facility] CATH LAB;  Service: Cardiovascular;  Laterality: N/A;  . LOWER EXTREMITY ANGIOGRAM Right 06/27/2015   Procedure: Lower Extremity Angiogram;  Surgeon: Serafina Mitchell, MD;  Location: [place] CV LAB;  Service: Cardiovascular;  Laterality: Right;  . MASTOIDECTOMY  1933  . PERIPHERAL VASCULAR CATHETERIZATION N/A 01/23/2015   Procedure: Abdominal Aortogram;  Surgeon: Serafina Mitchell, MD;  Location: [place] CV LAB;  Service: Cardiovascular;  Laterality: N/A;  . PERIPHERAL VASCULAR CATHETERIZATION N/A 06/27/2015   Procedure: Abdominal Aortogram;  Surgeon: Serafina Mitchell, MD;  Location: [place] CV LAB;  Service: Cardiovascular;  Laterality: N/A;  . RETINAL DETACHMENT SURGERY Left    Hx: of left eye  . TONSILLECTOMY      Social History   Socioeconomic History  . Marital status: Married    Spouse name: Not on file  . Number of children: 3  . Years of education: Not on file  . Highest education level: Not on file  Occupational History  . Occupation: retired  Tobacco Use  . Smoking status: Former Smoker    Types: Cigarettes    Quit date: 04/29/1975    Years since quitting: 44.4  . Smokeless tobacco: Never Used  Vaping Use  . Vaping Use: Never used  Substance and Sexual Activity  . Alcohol use: Yes    Alcohol/week: 3.0 standard drinks    Types: 3 Glasses of wine per week    Comment: 3 glasses  of wine today  . Drug use: No  . Sexual activity: Not Currently  Other Topics Concern  . 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 Difficulty of Paying Living Expenses:   Food Insecurity:   . Worried About Charity fundraiser in the Last Year:   . Arboriculturist in the Last Year:   Transportation Needs:   . Film/video editor (Medical):   Marland Kitchen Lack of Transportation (Non-Medical):   Physical Activity:   . Days of Exercise per Week:   . Minutes of Exercise per Session:  Stress:   . Feeling of Stress :   Social Connections:   . Frequency of Communication with Friends and Family:   . Frequency of Social Gatherings with Friends and Family:   . Attends Religious Services:   . Active Member of Clubs or Organizations:   . Attends Archivist Meetings:   Marland Kitchen Marital Status:   Intimate Partner Violence:   . Fear of Current or Ex-Partner:   . Emotionally Abused:   Marland Kitchen Physically Abused:   . Sexually Abused:     Family History  Problem Relation Age of Onset  . Heart disease Mother        After 65 yrs of age  . Heart attack Mother   . Hyperlipidemia Father   . Hypertension Father   . Heart disease Father        After 23 yrs of age  . Diabetes Father   . Stroke Sister   . Colon cancer Neg Hx   . Stomach cancer Neg Hx   . Rectal cancer Neg Hx   . Esophageal cancer Neg Hx   . Liver disease Neg Hx   . Kidney disease Neg Hx     Current Outpatient Medications  Medication Sig Dispense Refill  . ACCU-CHEK AVIVA PLUS test strip     . allopurinol (ZYLOPRIM) 100 MG tablet Take 100 mg by mouth daily.     Marland Kitchen amLODipine (NORVASC) 5 MG tablet Take 5 mg by mouth daily.     Marland Kitchen apixaban (ELIQUIS) 5 MG TABS tablet Take 1 tablet (5 mg total) by mouth 2 (two) times daily. 180 tablet 3  . Blood Glucose Monitoring Suppl (ACCU-CHEK AVIVA PLUS) w/Device KIT     . clopidogrel (PLAVIX) 75 MG  tablet Take 75 mg by mouth daily.    Marland Kitchen escitalopram (LEXAPRO) 10 MG tablet Take 10 mg by mouth daily.    . furosemide (LASIX) 20 MG tablet TAKE 1 TABLET BY MOUTH EVERY DAY (Patient taking differently: Take 20 mg by mouth every Monday, Wednesday, and Friday. ) 30 tablet 1  . isosorbide mononitrate (IMDUR) 60 MG 24 hr tablet Take 1 tablet (60 mg total) by mouth daily. 90 tablet 1  . lansoprazole (PREVACID) 30 MG capsule Take 30 mg by mouth daily.    Marland Kitchen levothyroxine (SYNTHROID, LEVOTHROID) 88 MCG tablet Take 88 mcg by mouth daily before breakfast.    . lisinopril (ZESTRIL) 40 MG tablet Take 40 mg by mouth daily.     . metoprolol succinate (TOPROL-XL) 50 MG 24 hr tablet Take 50 mg by mouth daily.    . multivitamin (RENA-VIT) TABS tablet Take 1 tablet by mouth daily.    . nitroGLYCERIN (NITROSTAT) 0.4 MG SL tablet     . RANEXA 500 MG 12 hr tablet Take 500 mg by mouth 2 (two) times daily.     . rosuvastatin (CRESTOR) 40 MG tablet Take 40 mg by mouth daily.    Marland Kitchen senna-docusate (SENOKOT-S) 8.6-50 MG tablet Take 1 tablet by mouth 2 (two) times daily between meals as needed for mild constipation. 180 tablet 0  . gabapentin (NEURONTIN) 300 MG capsule gabapentin 300 mg capsule  TAKE 1 CAPSULE BY MOUTH EVERYDAY AT BEDTIME    . HYDROcodone-acetaminophen (NORCO/VICODIN) 5-325 MG tablet hydrocodone 5 mg-acetaminophen 325 mg tablet    . pantoprazole (PROTONIX) 40 MG tablet pantoprazole 40 mg tablet,delayed release     No current facility-administered medications for this visit.    Allergies  Allergen Reactions  . Amoxicillin  Rash  . Metoprolol Itching     REVIEW OF SYSTEMS:  '[X]'$  denotes positive finding, '[ ]'$  denotes negative finding Cardiac  Comments:  Chest pain or chest pressure: x   Shortness of breath upon exertion:    Short of breath when lying flat:    Irregular heart rhythm: x  recently went to ED for this      Vascular    Pain in calf, thigh, or hip brought on by ambulation:    Pain in  feet at night that wakes you up from your sleep:     Blood clot in your veins:    Leg swelling:         Pulmonary    Oxygen at home:    Productive cough:     Wheezing:         Neurologic    Sudden weakness in arms or legs:     Sudden numbness in arms or legs:     Sudden onset of difficulty speaking or slurred speech:    Temporary loss of vision in one eye:     Problems with dizziness:         Gastrointestinal    Blood in stool:     Vomited blood:         Genitourinary    Burning when urinating:     Blood in urine:        Psychiatric    Major depression:         Hematologic    Bleeding problems:    Problems with blood clotting too easily:        Skin    Rashes or ulcers:        Constitutional    Fever or chills:      PHYSICAL EXAMINATION:  Vitals:   10/17/19 1239 10/17/19 1243  BP: 108/63 116/60  Pulse: (!) 59   Resp: 20   Temp: (!) 97.5 F (36.4 C)   TempSrc: Temporal   SpO2: 98%   Weight: 157 lb 9.6 oz (71.5 kg)   Height: '5\' 11"'$  (1.803 m)     General:  WDWN in NAD; vital signs documented above Gait: Normal  HENT: WNL, normocephalic Pulmonary: normal non-labored breathing without wheezing Cardiac: regular HR, without  Murmurs without carotid bruit Abdomen: soft, NT, no masses Vascular Exam/Pulses:  Right Left  Radial 2+ (normal) 2+ (normal)  Femoral 2+ (normal) 2+ (normal)  Popliteal 2+ (normal) 2+ (normal)  DP 2+ (normal) Not palpable  PT Not palpable Not palpable   Extremities: without ischemic changes, without Gangrene , without cellulitis; without open wounds; bilateral feet warm, motor and sensory intact Musculoskeletal: no muscle wasting or atrophy  Neurologic: A&O X 3;  No focal weakness or paresthesias are detected Psychiatric:  The pt has Normal affect.   Non-Invasive Vascular Imaging:   10/17/19 VAS Carotid Duplex Right Carotid: There is no evidence of stenosis in the CCA/ICA stent. The ECA appears >50% stenosis which is unchanged  from prior study  Left Carotid: Velocities in the left ICA are consistent with a 1-39% stenosis.   Vertebrals: Left vertebral artery demonstrates antegrade flow. Right vertebral artery was not visualized.  Subclavians: Right subclavian artery flow was disturbed. Normal flow hemodynamics were seen in the left subclavian artery.   VAS [place] Lower extremity bypass graft (right) Right Graft #1: femoropopliteal  +------------------+--------+--------+----------+--------+           PSV cm/sStenosisWaveform Comments  +------------------+--------+--------+----------+--------+  Inflow      71  biphasic       +------------------+--------+--------+----------+--------+  Prox Anastomosis 76       biphasic       +------------------+--------+--------+----------+--------+  Proximal Graft  64       monophasic      +------------------+--------+--------+----------+--------+  Mid Graft     47       monophasic      +------------------+--------+--------+----------+--------+  Distal Graft   67       monophasic      +------------------+--------+--------+----------+--------+  Distal Anastomosis123       biphasic       +------------------+--------+--------+----------+--------+  Outflow      71       biphasic       +------------------+--------+--------+----------+--------+    +-------+-----------+-----------+------------+------------+  ABI/TBIToday's ABIToday's TBIPrevious ABIPrevious TBI  +-------+-----------+-----------+------------+------------+  Right 0.93    0.66    0.91    0.66      +-------+-----------+-----------+------------+------------+  Left  0.52    0.00    0.53    0.41      +-------+-----------+-----------+------------+------------+   Bilateral ABI's appear stable however Left TBI  significantly decreased. Lack of waveform  ASSESSMENT/PLAN:: 86 y.o. male here for follow up for peripheral vascular disease and carotid stenosis. He is without symptoms relatable to his carotid disease. His duplex today is stable with patent right ICA stent and stable left ICA velocities. His right lower extremity is without symptoms and his bypass graft is patent. He has some new left lower extremity pain and with his decreased TBI I have discussed further evaluation with lower extremity arterial duplex - He will continue his Statin, Plavix and Eliquis - Recommend he follow up with PCP and Cardiologist in regard to his recent symptoms - He will have Carotid duplex, ABIs and RLE bypass graft duplex in 6 months - He will follow up in the next 1-3 weeks with duplex of his left lower extremity   Karoline Caldwell, PA-C [facility] [phone]

## 2019-10-18 NOTE — Progress Notes (Signed)
Cardiology Office Note:    Date:  10/19/2019   ID:  Ian Cummings, DOB [birth year], MRN [id number]  PCP:  Marton Redwood, MD  Cardiologist:  Shirlee More, MD    Referring MD: Marton Redwood, MD    ASSESSMENT:    1. Coronary artery disease of native artery of native heart with stable angina pectoris ([place])   2. PAF (paroxysmal atrial fibrillation) ([place])   3. Chronic anticoagulation   4. Hypertensive heart disease with chronic diastolic congestive heart failure ([place])   5. Nonrheumatic mitral valve regurgitation   6. Mixed hyperlipidemia    PLAN:    In order of problems listed above:  1. With known CAD recent ACS repeat ED visit with chest pain he needs undergo an ischemia evaluation.  Met with the patient we reviewed the option of noninvasive versus invasive and I think he is best served with coronary angiography.  Options benefits and risks detailed and we will set him up for next week.  I strongly encouraged him to cancel his planned trip to [place] next Wednesday. 2. He is anticoagulated continue beta-blocker has had no recurrence of atrial fibrillation and has had a ZIO monitor awaiting return 3. Continue his anticoagulant 4. Heart failure is compensated blood pressure is low symptomatic stop ACE inhibitor 5. Moderate functional mitral regurgitation 6. Continue high intensity statin   Next appointment: 2 to 3 weeks   Medication Adjustments/Labs and Tests Ordered: Current medicines are reviewed at length with the patient today.  Concerns regarding medicines are outlined above.  No orders of the defined types were placed in this encounter.  No orders of the defined types were placed in this encounter.   Chief Complaint  Patient presents with  . Follow-up    After recent ED visit    History of Present Illness:    Ian Cummings is a 86 y.o. male with a hx of coronary artery disease status post CABG as well as PCI, hyperlipidemia, hypertension, peripheral artery disease  with right lower extremity femoral-popliteal bypass graft which was widely patent on duplex 10/17/2019 and right ICA stent without restenosis hypothyroidism seen 02/01/2019 by Dr Harriet Masson for MR and SOB.  I independently reviewed his echocardiogram he has hypokinesia of the basilar inferior wall and septum has mitral regurgitation which is moderate it is central very narrow vena contracta and unfortunately pulmonary vein was not evaluated.  I do not see criteria for severe mitral regurgitation.  Aortic valve is mildly thickened and restricted and when I reviewed the study there is a minimal gradient with peak and mean gradients are in the range of 20 and 9 mmHg.    He was last seen 10/07/2019 with concerns of recent hospitalization with unstable angina pectoris heart failure mitral regurgitation and paroxysmal atrial fibrillation.  He was initiated on anticoagulation I advised him undergo coronary angiography and he declined because of a planned trip to [place] with his son.  He subsequent was seen in the emergency room 10/12/2019 with palpitation and was discharged home.  .  After admission to the hospital with unstable angina pectoris and mild troponin elevation.   6 d ago  (10/12/19) 6 d ago  (10/12/19) 1 mo ago  (09/10/19)   Troponin I (High Sensitivity) <18 ng/L 25High  22High CM  23High   He has CKD chronic stage III Compliance with diet, lifestyle and medications: Yes  He is seen back by me in the office for 2 reasons 1 is he is really reconsidering  this trip to [place] with his son and I told him I think he should cancel.  He is being seen in follow-up to the ED visit and he said he went there because of chest pain which finally resolved after 3 nitroglycerin.  He does not think he has been back in atrial fibrillation.  In view of his history of recent ACS or repeat ED visit with chest pain I think he should undergo coronary angiography options benefits and risks detailed he is stage III CKD his  blood pressure has been relatively low in the range of 100-110 he feels weak, to stop his ACE inhibitor.  He is not having edema orthopnea no further chest pain shortness of breath palpitation or syncope he is not allergic to contrast dye. Past Medical History:  Diagnosis Date  . AAA (abdominal aortic aneurysm) ([place])    2004, Pt stated not there now  . Anginal pain ([place])   . Anxiety   . Arthritis   . BPH (benign prostatic hypertrophy)   . CAD (coronary artery disease)   . Carotid artery disease ([place])    Right carotid stent 2013  . Claudication ([place])   . Colon polyps    adenomatous  . Complex sleep apnea syndrome 11/11/2017  . Coronary artery disease   . Depression   . GERD (gastroesophageal reflux disease)   . Gout   . H/O hiatal hernia   . Hyperlipidemia   . Hypertension   . Hypothyroidism   . MI (myocardial infarction) ([place]) 4/14  . Neuropathy    Hx: of in B/L toes  . Peripheral vascular disease ([place])    with claudication  . Pre-diabetes   . Stroke ([place])   . TIA (transient ischemic attack)    Hx: of    Past Surgical History:  Procedure Laterality Date  . CAROTID STENT INSERTION Right 2013   right at [facility]  . CATARACT EXTRACTION Bilateral    Hx: of both eyes  . COLONOSCOPY     Hx: of  . COLONOSCOPY    . CORONARY ANGIOPLASTY WITH STENT PLACEMENT  2010  . CORONARY ARTERY BYPASS GRAFT  1995  . CORONARY ARTERY BYPASS GRAFT N/A 10/18/2012   Procedure: REDO CORONARY ARTERY BYPASS GRAFTING (CABG);  Surgeon: Grace Isaac, MD;  Location: [place];  Service: Open Heart Surgery;  Laterality: N/A;  off pump times two using endoscopically harvested left saphenous vein  . CORONARY STENT PLACEMENT  06/2016  . ENDARTERECTOMY FEMORAL Right 08/03/2015   Procedure: RIGHT FEMORAL ENDARTERECTOMY WITH PATCH ANGIOPLASTY;  Surgeon: Serafina Mitchell, MD;  Location: [facility];  Service: Vascular;  Laterality: Right;  . FEMORAL-POPLITEAL BYPASS GRAFT Right 08/03/2015   Procedure: RIGHT  FEMORAL-BELOW KNEE POPLITEAL ARTERY BYPASS GRAFT;  Surgeon: Serafina Mitchell, MD;  Location: [facility];  Service: Vascular;  Laterality: Right;  . FOOT SURGERY Left   . FRACTURE SURGERY Left    Hx: of Left heel  . INGUINAL HERNIA REPAIR Bilateral    X 2   . INTRAOPERATIVE TRANSESOPHAGEAL ECHOCARDIOGRAM N/A 10/18/2012   Procedure: INTRAOPERATIVE TRANSESOPHAGEAL ECHOCARDIOGRAM;  Surgeon: Grace Isaac, MD;  Location: [facility];  Service: Open Heart Surgery;  Laterality: N/A;  . LEFT HEART CATHETERIZATION WITH CORONARY/GRAFT ANGIOGRAM N/A 02/02/2013   Procedure: LEFT HEART CATHETERIZATION WITH Beatrix Fetters;  Surgeon: Jacolyn Reedy, MD;  Location: [facility] CATH LAB;  Service: Cardiovascular;  Laterality: N/A;  . LOWER EXTREMITY ANGIOGRAM Right 06/27/2015   Procedure: Lower Extremity Angiogram;  Surgeon:  Vance W Brabham, MD;  Location: [facility] INVASIVE CV LAB;  Service: Cardiovascular;  Laterality: Right;  °• MASTOIDECTOMY  1933  °• PERIPHERAL VASCULAR CATHETERIZATION N/A 01/23/2015  ° Procedure: Abdominal Aortogram;  Surgeon: Vance W Brabham, MD;  Location: [facility] INVASIVE CV LAB;  Service: Cardiovascular;  Laterality: N/A;  °• PERIPHERAL VASCULAR CATHETERIZATION N/A 06/27/2015  ° Procedure: Abdominal Aortogram;  Surgeon: Vance W Brabham, MD;  Location: [facility] INVASIVE CV LAB;  Service: Cardiovascular;  Laterality: N/A;  °• RETINAL DETACHMENT SURGERY Left   ° Hx: of left eye  °• TONSILLECTOMY    ° ° °Current Medications: °Current Meds  °Medication Sig  °• ACCU-CHEK AVIVA PLUS test strip   °• allopurinol (ZYLOPRIM) 100 MG tablet Take 100 mg by mouth daily.   °• amLODipine (NORVASC) 5 MG tablet Take 5 mg by mouth daily.   °• apixaban (ELIQUIS) 5 MG TABS tablet Take 1 tablet (5 mg total) by mouth 2 (two) times daily.  °• Blood Glucose Monitoring Suppl (ACCU-CHEK AVIVA PLUS) w/Device KIT   °• clopidogrel (PLAVIX) 75 MG tablet Take 75 mg by mouth daily.  °• escitalopram (LEXAPRO) 10 MG tablet Take 10 mg by mouth daily.  °•  furosemide (LASIX) 20 MG tablet TAKE 1 TABLET BY MOUTH EVERY DAY (Patient taking differently: Take 20 mg by mouth every Monday, Wednesday, and Friday. )  °• gabapentin (NEURONTIN) 300 MG capsule gabapentin 300 mg capsule ° TAKE 1 CAPSULE BY MOUTH EVERYDAY AT BEDTIME  °• HYDROcodone-acetaminophen (NORCO/VICODIN) 5-325 MG tablet hydrocodone 5 mg-acetaminophen 325 mg tablet  °• isosorbide mononitrate (IMDUR) 60 MG 24 hr tablet Take 1 tablet (60 mg total) by mouth daily.  °• lansoprazole (PREVACID) 30 MG capsule Take 30 mg by mouth daily.  °• levothyroxine (SYNTHROID, LEVOTHROID) 88 MCG tablet Take 88 mcg by mouth daily before breakfast.  °• metoprolol succinate (TOPROL-XL) 50 MG 24 hr tablet Take 50 mg by mouth daily.  °• multivitamin (RENA-VIT) TABS tablet Take 1 tablet by mouth daily.  °• nitroGLYCERIN (NITROSTAT) 0.4 MG SL tablet   °• pantoprazole (PROTONIX) 40 MG tablet pantoprazole 40 mg tablet,delayed release  °• RANEXA 500 MG 12 hr tablet Take 500 mg by mouth 2 (two) times daily.   °• rosuvastatin (CRESTOR) 40 MG tablet Take 40 mg by mouth daily.  °• senna-docusate (SENOKOT-S) 8.6-50 MG tablet Take 1 tablet by mouth 2 (two) times daily between meals as needed for mild constipation.  °• [DISCONTINUED] lisinopril (ZESTRIL) 40 MG tablet Take 40 mg by mouth daily.   °  ° °Allergies:   Amoxicillin and Metoprolol  ° °Social History  ° °Socioeconomic History  °• Marital status: Married  °  Spouse name: Not on file  °• Number of children: 3  °• Years of education: Not on file  °• Highest education level: Not on file  °Occupational History  °• Occupation: retired  °Tobacco Use  °• Smoking status: Former Smoker  °  Types: Cigarettes  °  Quit date: 04/29/1975  °  Years since quitting: 44.5  °• Smokeless tobacco: Never Used  °Vaping Use  °• Vaping Use: Never used  °Substance and Sexual Activity  °• Alcohol use: Yes  °  Alcohol/week: 3.0 standard drinks  °  Types: 3 Glasses of wine per week  °  Comment: 3 glasses of wine today   °• Drug use: No  °• Sexual activity: Not Currently  °Other Topics Concern  °• Not on file  °Social History Narrative  ° 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 Difficulty of Paying Living Expenses:   Food Insecurity:   . Worried About Charity fundraiser in the Last Year:   . Arboriculturist in the Last Year:   Transportation Needs:   . Film/video editor (Medical):   Marland Kitchen Lack of Transportation (Non-Medical):   Physical Activity:   . Days of Exercise per Week:   . Minutes of Exercise per Session:   Stress:   . Feeling of Stress :   Social Connections:   . Frequency of Communication with Friends and Family:   . Frequency of Social Gatherings with Friends and Family:   . Attends Religious Services:   . Active Member of Clubs or Organizations:   . Attends Archivist Meetings:   Marland Kitchen Marital Status:      Family History: The patient's family history includes Diabetes in his father; Heart attack in his mother; Heart disease in his father and mother; Hyperlipidemia in his father; Hypertension in his father; Stroke in his sister. There is no history of Colon cancer, Stomach cancer, Rectal cancer, Esophageal cancer, Liver disease, or Kidney disease. ROS:   Please see the history of present illness.    All other systems reviewed and are negative.  EKGs/Labs/Other Studies Reviewed:    The following studies were reviewed today:  EKG:  EKG 10/07/2019 shows sinus bradycardia otherwise normal  Recent Labs: 09/10/2019: B Natriuretic Peptide 284.4 09/12/2019: Magnesium 1.9 10/12/2019: ALT 21; BUN 37; Creatinine, Ser 1.43; Hemoglobin 12.9; Platelets 183; Potassium 4.4; Sodium 136  Recent Lipid Panel    Component Value Date/Time   CHOL  04/18/2009 0241    109        ATP III CLASSIFICATION:  <200     mg/dL   Desirable  200-239  mg/dL   Borderline High  >=240    mg/dL   High          TRIG 114 04/18/2009 0241   HDL 34 (L)  04/18/2009 0241   CHOLHDL 3.2 04/18/2009 0241   VLDL 23 04/18/2009 0241   LDLCALC  04/18/2009 0241    52        Total Cholesterol/HDL:CHD Risk Coronary Heart Disease Risk Table                     Men   Women  1/2 Average Risk   3.4   3.3  Average Risk       5.0   4.4  2 X Average Risk   9.6   7.1  3 X Average Risk  23.4   11.0        Use the calculated Patient Ratio above and the CHD Risk Table to determine the patient's CHD Risk.        ATP III CLASSIFICATION (LDL):  <100     mg/dL   Optimal  100-129  mg/dL   Near or Above                    Optimal  130-159  mg/dL   Borderline  160-189  mg/dL   High  >190     mg/dL   Very High    Physical Exam:    VS:  BP 106/62 (BP Location: Right Arm, Patient Position: Sitting, Cuff Size: Normal)   Pulse 72   Ht _0  (1.803 m)   Wt 159 lb 9.6 oz (72.4 kg)  SpO2 99%   BMI 22.26 kg/m     Wt Readings from Last 3 Encounters:  10/19/19 159 lb 9.6 oz (72.4 kg)  10/17/19 157 lb 9.6 oz (71.5 kg)  10/07/19 160 lb (72.6 kg)     GEN:  Well nourished, well developed in no acute distress HEENT: Normal NECK: No JVD; No carotid bruits LYMPHATICS: No lymphadenopathy CARDIAC: RRR, no murmurs, rubs, gallops RESPIRATORY:  Clear to auscultation without rales, wheezing or rhonchi  ABDOMEN: Soft, non-tender, non-distended MUSCULOSKELETAL:  No edema; No deformity  SKIN: Warm and dry NEUROLOGIC:  Alert and oriented x 3 PSYCHIATRIC:  Normal affect    Signed, Shirlee More, MD  10/19/2019 11:31 AM    [place]

## 2019-10-18 NOTE — H&P (View-Only) (Signed)
Cardiology Office Note:    Date:  10/19/2019   ID:  Ian Cummings, DOB 12-03-1928, MRN [id number]  PCP:  Marton Redwood, MD  Cardiologist:  Shirlee More, MD    Referring MD: Marton Redwood, MD    ASSESSMENT:    1. Coronary artery disease of native artery of native heart with stable angina pectoris ([place])   2. PAF (paroxysmal atrial fibrillation) ([place])   3. Chronic anticoagulation   4. Hypertensive heart disease with chronic diastolic congestive heart failure ([place])   5. Nonrheumatic mitral valve regurgitation   6. Mixed hyperlipidemia    PLAN:    In order of problems listed above:  1. With known CAD recent ACS repeat ED visit with chest pain he needs undergo an ischemia evaluation.  Met with the patient we reviewed the option of noninvasive versus invasive and I think he is best served with coronary angiography.  Options benefits and risks detailed and we will set him up for next week.  I strongly encouraged him to cancel his planned trip to [place] next Wednesday. 2. He is anticoagulated continue beta-blocker has had no recurrence of atrial fibrillation and has had a ZIO monitor awaiting return 3. Continue his anticoagulant 4. Heart failure is compensated blood pressure is low symptomatic stop ACE inhibitor 5. Moderate functional mitral regurgitation 6. Continue high intensity statin   Next appointment: 2 to 3 weeks   Medication Adjustments/Labs and Tests Ordered: Current medicines are reviewed at length with the patient today.  Concerns regarding medicines are outlined above.  No orders of the defined types were placed in this encounter.  No orders of the defined types were placed in this encounter.   Chief Complaint  Patient presents with   Follow-up    After recent ED visit    History of Present Illness:    Ian Cummings is a 86 y.o. male with a hx of coronary artery disease status post CABG as well as PCI, hyperlipidemia, hypertension, peripheral artery disease  with right lower extremity femoral-popliteal bypass graft which was widely patent on duplex 10/17/2019 and right ICA stent without restenosis hypothyroidism seen 02/01/2019 by Dr Harriet Masson for MR and SOB.  I independently reviewed his echocardiogram he has hypokinesia of the basilar inferior wall and septum has mitral regurgitation which is moderate it is central very narrow vena contracta and unfortunately pulmonary vein was not evaluated.  I do not see criteria for severe mitral regurgitation.  Aortic valve is mildly thickened and restricted and when I reviewed the study there is a minimal gradient with peak and mean gradients are in the range of 20 and 9 mmHg.    He was last seen 10/07/2019 with concerns of recent hospitalization with unstable angina pectoris heart failure mitral regurgitation and paroxysmal atrial fibrillation.  He was initiated on anticoagulation I advised him undergo coronary angiography and he declined because of a planned trip to [place] with his son.  He subsequent was seen in the emergency room 10/12/2019 with palpitation and was discharged home.  .  After admission to the hospital with unstable angina pectoris and mild troponin elevation.   6 d ago  (10/12/19) 6 d ago  (10/12/19) 1 mo ago  (09/10/19)   Troponin I (High Sensitivity) <18 ng/L 25High  22High CM  23High   He has CKD chronic stage III Compliance with diet, lifestyle and medications: Yes  He is seen back by me in the office for 2 reasons 1 is he is really reconsidering  this trip to [place] with his son and I told him I think he should cancel.  He is being seen in follow-up to the ED visit and he said he went there because of chest pain which finally resolved after 3 nitroglycerin.  He does not think he has been back in atrial fibrillation.  In view of his history of recent ACS or repeat ED visit with chest pain I think he should undergo coronary angiography options benefits and risks detailed he is stage III CKD his  blood pressure has been relatively low in the range of 100-110 he feels weak, to stop his ACE inhibitor.  He is not having edema orthopnea no further chest pain shortness of breath palpitation or syncope he is not allergic to contrast dye. Past Medical History:  Diagnosis Date   AAA (abdominal aortic aneurysm) ([place])    2004, Pt stated not there now   Anginal pain ([place])    Anxiety    Arthritis    BPH (benign prostatic hypertrophy)    CAD (coronary artery disease)    Carotid artery disease ([place])    Right carotid stent 2013   Claudication [facility])    Colon polyps    adenomatous   Complex sleep apnea syndrome 11/11/2017   Coronary artery disease    Depression    GERD (gastroesophageal reflux disease)    Gout    H/O hiatal hernia    Hyperlipidemia    Hypertension    Hypothyroidism    MI (myocardial infarction) ([place]) 4/14   Neuropathy    Hx: of in B/L toes   Peripheral vascular disease ([place])    with claudication   Pre-diabetes    Stroke ([place])    TIA (transient ischemic attack)    Hx: of    Past Surgical History:  Procedure Laterality Date   CAROTID STENT INSERTION Right 2013   right at [place] EXTRACTION Bilateral    Hx: of both eyes   COLONOSCOPY     Hx: of   COLONOSCOPY     CORONARY ANGIOPLASTY WITH STENT PLACEMENT  2010   CORONARY ARTERY BYPASS GRAFT  1995   CORONARY ARTERY BYPASS GRAFT N/A 10/18/2012   Procedure: REDO CORONARY ARTERY BYPASS GRAFTING (CABG);  Surgeon: Grace Isaac, MD;  Location: [facility];  Service: Open Heart Surgery;  Laterality: N/A;  off pump times two using endoscopically harvested left saphenous vein   CORONARY STENT PLACEMENT  06/2016   ENDARTERECTOMY FEMORAL Right 08/03/2015   Procedure: RIGHT FEMORAL ENDARTERECTOMY WITH PATCH ANGIOPLASTY;  Surgeon: Serafina Mitchell, MD;  Location: [facility];  Service: Vascular;  Laterality: Right;   FEMORAL-POPLITEAL BYPASS GRAFT Right 08/03/2015   Procedure: RIGHT  FEMORAL-BELOW KNEE POPLITEAL ARTERY BYPASS GRAFT;  Surgeon: Serafina Mitchell, MD;  Location: [facility];  Service: Vascular;  Laterality: Right;   FOOT SURGERY Left    FRACTURE SURGERY Left    Hx: of Left heel   INGUINAL HERNIA REPAIR Bilateral    X 2    INTRAOPERATIVE TRANSESOPHAGEAL ECHOCARDIOGRAM N/A 10/18/2012   Procedure: INTRAOPERATIVE TRANSESOPHAGEAL ECHOCARDIOGRAM;  Surgeon: Grace Isaac, MD;  Location: [facility];  Service: Open Heart Surgery;  Laterality: N/A;   LEFT HEART CATHETERIZATION WITH CORONARY/GRAFT ANGIOGRAM N/A 02/02/2013   Procedure: LEFT HEART CATHETERIZATION WITH Beatrix Fetters;  Surgeon: Jacolyn Reedy, MD;  Location[facility] CATH LAB;  Service: Cardiovascular;  Laterality: N/A;   LOWER EXTREMITY ANGIOGRAM Right 06/27/2015   Procedure: Lower Extremity Angiogram;  Surgeon:  Serafina Mitchell, MD;  Location: [facility] [place] [facility] CV LAB;  Service: Cardiovascular;  Laterality: Right;   MASTOIDECTOMY  1933   PERIPHERAL VASCULAR CATHETERIZATION N/A 01/23/2015   Procedure: Abdominal Aortogram;  Surgeon: Serafina Mitchell, MD;  Location: [facility] CV LAB;  Service: Cardiovascular;  Laterality: N/A;   PERIPHERAL VASCULAR CATHETERIZATION N/A 06/27/2015   Procedure: Abdominal Aortogram;  Surgeon: Serafina Mitchell, MD;  Location: [facility] CV LAB;  Service: Cardiovascular;  Laterality: N/A;   RETINAL DETACHMENT SURGERY Left    Hx: of left eye   TONSILLECTOMY      Current Medications: Current Meds  Medication Sig   ACCU-CHEK AVIVA PLUS test strip    allopurinol (ZYLOPRIM) 100 MG tablet Take 100 mg by mouth daily.    amLODipine (NORVASC) 5 MG tablet Take 5 mg by mouth daily.    apixaban (ELIQUIS) 5 MG TABS tablet Take 1 tablet (5 mg total) by mouth 2 (two) times daily.   Blood Glucose Monitoring Suppl (ACCU-CHEK AVIVA PLUS) w/Device KIT    clopidogrel (PLAVIX) 75 MG tablet Take 75 mg by mouth daily.   escitalopram (LEXAPRO) 10 MG tablet Take 10 mg by mouth daily.    furosemide (LASIX) 20 MG tablet TAKE 1 TABLET BY MOUTH EVERY DAY (Patient taking differently: Take 20 mg by mouth every Monday, Wednesday, and Friday. )   gabapentin (NEURONTIN) 300 MG capsule gabapentin 300 mg capsule  TAKE 1 CAPSULE BY MOUTH EVERYDAY AT BEDTIME   HYDROcodone-acetaminophen (NORCO/VICODIN) 5-325 MG tablet hydrocodone 5 mg-acetaminophen 325 mg tablet   isosorbide mononitrate (IMDUR) 60 MG 24 hr tablet Take 1 tablet (60 mg total) by mouth daily.   lansoprazole (PREVACID) 30 MG capsule Take 30 mg by mouth daily.   levothyroxine (SYNTHROID, LEVOTHROID) 88 MCG tablet Take 88 mcg by mouth daily before breakfast.   metoprolol succinate (TOPROL-XL) 50 MG 24 hr tablet Take 50 mg by mouth daily.   multivitamin (RENA-VIT) TABS tablet Take 1 tablet by mouth daily.   nitroGLYCERIN (NITROSTAT) 0.4 MG SL tablet    pantoprazole (PROTONIX) 40 MG tablet pantoprazole 40 mg tablet,delayed release   RANEXA 500 MG 12 hr tablet Take 500 mg by mouth 2 (two) times daily.    rosuvastatin (CRESTOR) 40 MG tablet Take 40 mg by mouth daily.   senna-docusate (SENOKOT-S) 8.6-50 MG tablet Take 1 tablet by mouth 2 (two) times daily between meals as needed for mild constipation.   [DISCONTINUED] lisinopril (ZESTRIL) 40 MG tablet Take 40 mg by mouth daily.      Allergies:   Amoxicillin and Metoprolol   Social History   Socioeconomic History   Marital status: Married    Spouse name: Not on file   Number of children: 3   Years of education: Not on file   Highest education level: Not on file  Occupational History   Occupation: retired  Tobacco Use   Smoking status: Former Smoker    Types: Cigarettes    Quit date: 04/29/1975    Years since quitting: 44.5   Smokeless tobacco: Never Used  Vaping Use   Vaping Use: Never used  Substance and Sexual Activity   Alcohol use: Yes    Alcohol/week: 3.0 standard drinks    Types: 3 Glasses of wine per week    Comment: 3 glasses of wine today    Drug use: No   Sexual activity: Not Currently  Other Topics Concern   Not on file  Social History Narrative   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)  Social Determinants of Health   Financial Resource Strain:    Difficulty of Paying Living Expenses:   Food Insecurity:    Worried About Charity fundraiser in the Last Year:    Arboriculturist in the Last Year:   Transportation Needs:    Film/video editor (Medical):    Lack of Transportation (Non-Medical):   Physical Activity:    Days of Exercise per Week:    Minutes of Exercise per Session:   Stress:    Feeling of Stress :   Social Connections:    Frequency of Communication with Friends and Family:    Frequency of Social Gatherings with Friends and Family:    Attends Religious Services:    Active Member of Clubs or Organizations:    Attends Music therapist:    Marital Status:      Family History: The patient's family history includes Diabetes in his father; Heart attack in his mother; Heart disease in his father and mother; Hyperlipidemia in his father; Hypertension in his father; Stroke in his sister. There is no history of Colon cancer, Stomach cancer, Rectal cancer, Esophageal cancer, Liver disease, or Kidney disease. ROS:   Please see the history of present illness.    All other systems reviewed and are negative.  EKGs/Labs/Other Studies Reviewed:    The following studies were reviewed today:  EKG:  EKG 10/07/2019 shows sinus bradycardia otherwise normal  Recent Labs: 09/10/2019: B Natriuretic Peptide 284.4 09/12/2019: Magnesium 1.9 10/12/2019: ALT 21; BUN 37; Creatinine, Ser 1.43; Hemoglobin 12.9; Platelets 183; Potassium 4.4; Sodium 136  Recent Lipid Panel    Component Value Date/Time   CHOL  04/18/2009 0241    109        ATP III CLASSIFICATION:  <200     mg/dL   Desirable  200-239  mg/dL   Borderline High  >=240    mg/dL   High          TRIG 114 04/18/2009 0241   HDL 34 (L)  04/18/2009 0241   CHOLHDL 3.2 04/18/2009 0241   VLDL 23 04/18/2009 0241   LDLCALC  04/18/2009 0241    52        Total Cholesterol/HDL:CHD Risk Coronary Heart Disease Risk Table                     Men   Women  1/2 Average Risk   3.4   3.3  Average Risk       5.0   4.4  2 X Average Risk   9.6   7.1  3 X Average Risk  23.4   11.0        Use the calculated Patient Ratio above and the CHD Risk Table to determine the patient's CHD Risk.        ATP III CLASSIFICATION (LDL):  <100     mg/dL   Optimal  100-129  mg/dL   Near or Above                    Optimal  130-159  mg/dL   Borderline  160-189  mg/dL   High  >190     mg/dL   Very High    Physical Exam:    VS:  BP 106/62 (BP Location: Right Arm, Patient Position: Sitting, Cuff Size: Normal)    Pulse 72    Ht 5' 11"  (1.803 m)    Wt 159 lb 9.6 oz (72.4  kg)    SpO2 99%    BMI 22.26 kg/m     Wt Readings from Last 3 Encounters:  10/19/19 159 lb 9.6 oz (72.4 kg)  10/17/19 157 lb 9.6 oz (71.5 kg)  10/07/19 160 lb (72.6 kg)     GEN:  Well nourished, well developed in no acute distress HEENT: Normal NECK: No JVD; No carotid bruits LYMPHATICS: No lymphadenopathy CARDIAC: RRR, no murmurs, rubs, gallops RESPIRATORY:  Clear to auscultation without rales, wheezing or rhonchi  ABDOMEN: Soft, non-tender, non-distended MUSCULOSKELETAL:  No edema; No deformity  SKIN: Warm and dry NEUROLOGIC:  Alert and oriented x 3 PSYCHIATRIC:  Normal affect    Signed, Shirlee More, MD  10/19/2019 11:31 AM    [place]

## 2019-10-19 ENCOUNTER — Other Ambulatory Visit: Payer: Self-pay | Admitting: *Deleted

## 2019-10-19 ENCOUNTER — Encounter: Payer: Self-pay | Admitting: Cardiology

## 2019-10-19 ENCOUNTER — Ambulatory Visit: Payer: Medicare HMO | Admitting: Cardiology

## 2019-10-19 ENCOUNTER — Telehealth: Payer: Self-pay | Admitting: Cardiology

## 2019-10-19 ENCOUNTER — Other Ambulatory Visit: Payer: Self-pay

## 2019-10-19 VITALS — BP 106/62 | HR 72 | Ht 71.0 in | Wt 159.6 lb

## 2019-10-19 DIAGNOSIS — I110 Hypertensive heart disease with heart failure: Secondary | ICD-10-CM | POA: Diagnosis not present

## 2019-10-19 DIAGNOSIS — I70219 Atherosclerosis of native arteries of extremities with intermittent claudication, unspecified extremity: Secondary | ICD-10-CM

## 2019-10-19 DIAGNOSIS — I5032 Chronic diastolic (congestive) heart failure: Secondary | ICD-10-CM | POA: Diagnosis not present

## 2019-10-19 DIAGNOSIS — I25118 Atherosclerotic heart disease of native coronary artery with other forms of angina pectoris: Secondary | ICD-10-CM

## 2019-10-19 DIAGNOSIS — I340 Nonrheumatic mitral (valve) insufficiency: Secondary | ICD-10-CM | POA: Diagnosis not present

## 2019-10-19 DIAGNOSIS — I480 Paroxysmal atrial fibrillation: Secondary | ICD-10-CM

## 2019-10-19 DIAGNOSIS — Z7901 Long term (current) use of anticoagulants: Secondary | ICD-10-CM

## 2019-10-19 DIAGNOSIS — E782 Mixed hyperlipidemia: Secondary | ICD-10-CM

## 2019-10-19 DIAGNOSIS — I6523 Occlusion and stenosis of bilateral carotid arteries: Secondary | ICD-10-CM

## 2019-10-19 DIAGNOSIS — I7025 Atherosclerosis of native arteries of other extremities with ulceration: Secondary | ICD-10-CM

## 2019-10-19 NOTE — Telephone Encounter (Signed)
     I went in pt's chart to see who called him this morning. It was Lilia Pro, she had wanted him to come in at 11:00 instead of 11:20 today.

## 2019-10-19 NOTE — Patient Instructions (Addendum)
Medication Instructions:  Your physician has recommended you make the following change in your medication:  STOP: Lisinopril *If you need a refill on your cardiac medications before your next appointment, please call your pharmacy*   Lab Work: Your physician recommends that you return for lab work in: TODAY CBC, BMP If you have labs (blood work) drawn today and your tests are completely normal, you will receive your results only by: Marland Kitchen MyChart Message (if you have MyChart) OR . A paper copy in the mail If you have any lab test that is abnormal or we need to change your treatment, we will call you to review the results.   Testing/Procedures:    [facility] [id number] Dept: [phone] Loc: [facility]  10/19/2019  You are scheduled for a Cardiac Catheterization on Tuesday, June 29 with Dr. [name].  1. Please arrive at the [facility] (Main Entrance A) at [facility]: [address] [place],  [address] at 7:00 AM (This time is four hours before your procedure to ensure your preparation and to get IV fluids). Free valet parking service is available.   Special note: Every effort is made to have your procedure done on time. Please understand that emergencies sometimes delay scheduled procedures.  2. Diet: Do not eat solid foods after midnight.  The patient may have clear liquids until 5am upon the day of the procedure.  3. Labs: You will need to have blood drawn on TODAY  4. Medication instructions in preparation for your procedure:   Contrast Allergy: No   Please hold your furosemide for two days prior to your cardiac cath.  Please hold your Eliquis for two days prior to your cardiac cath.   On the morning of your procedure, take your Plavix/Clopidogrel and any morning medicines NOT listed above.  You may use sips of water.  5. Plan  for one night stay--bring personal belongings. 6. Bring a current list of your medications and current insurance cards. 7. You MUST have a responsible person to drive you home. 8. Someone MUST be with you the first 24 hours after you arrive home or your discharge will be delayed. 9. Please wear clothes that are easy to get on and off and wear slip-on shoes.  Thank you for allowing [place] to care for you!   -- [facility]    Follow-Up: At [facility], you and your health needs are our priority.  As part of our continuing mission to provide you with exceptional heart care, we have created designated Provider Care Teams.  These Care Teams include your primary Cardiologist (physician) and Advanced Practice Providers (APPs -  Physician Assistants and Nurse Practitioners) who all work together to provide you with the care you need, when you need it.  We recommend signing up for the patient portal called "MyChart".  Sign up information is provided on this After Visit Summary.  MyChart is used to connect with patients for Virtual Visits (Telemedicine).  Patients are able to view lab/test results, encounter notes, upcoming appointments, etc.  Non-urgent messages can be sent to your provider as well.   To learn more about what you can do with MyChart, go to [email or web address].    Your next appointment:   2 week(s)  The format for your next appointment:   In Person  Provider:   Shirlee More, MD   Other  Instructions

## 2019-10-20 ENCOUNTER — Telehealth: Payer: Self-pay

## 2019-10-20 LAB — CBC
Hematocrit: 37.7 % (ref 37.5–51.0)
Hemoglobin: 12.9 g/dL — ABNORMAL LOW (ref 13.0–17.7)
MCH: 33.1 pg — ABNORMAL HIGH (ref 26.6–33.0)
MCHC: 34.2 g/dL (ref 31.5–35.7)
MCV: 97 fL (ref 79–97)
Platelets: 185 10*3/uL (ref 150–450)
RBC: 3.9 x10E6/uL — ABNORMAL LOW (ref 4.14–5.80)
RDW: 13 % (ref 11.6–15.4)
WBC: 8 10*3/uL (ref 3.4–10.8)

## 2019-10-20 LAB — BASIC METABOLIC PANEL
BUN/Creatinine Ratio: 25 — ABNORMAL HIGH (ref 10–24)
BUN: 29 mg/dL — ABNORMAL HIGH (ref 8–27)
CO2: 24 mmol/L (ref 20–29)
Calcium: 8.9 mg/dL (ref 8.6–10.2)
Chloride: 103 mmol/L (ref 96–106)
Creatinine, Ser: 1.14 mg/dL (ref 0.76–1.27)
GFR calc Af Amer: 66 mL/min/{1.73_m2} (ref >59)
GFR calc non Af Amer: 57 mL/min/{1.73_m2} — ABNORMAL LOW (ref >59)
Glucose: 90 mg/dL (ref 65–99)
Potassium: 4.7 mmol/L (ref 3.5–5.2)
Sodium: 138 mmol/L (ref 134–144)

## 2019-10-20 NOTE — Telephone Encounter (Signed)
-----   Message from Richardo Priest, MD sent at 10/20/2019  7:55 AM EDT ----- Normal or stable result  Kidney function is improved

## 2019-10-20 NOTE — Telephone Encounter (Signed)
Spoke with patient regarding results and recommendation.  Patient verbalizes understanding and is agreeable to plan of care. Advised patient to call back with any issues or concerns.  

## 2019-10-21 ENCOUNTER — Telehealth: Payer: Self-pay

## 2019-10-21 ENCOUNTER — Telehealth: Payer: Self-pay | Admitting: Cardiology

## 2019-10-21 NOTE — Telephone Encounter (Signed)
Sher calling back in. She reports that a prior Josem Kaufmann is needed for pts procedure ( L heart cath with coronary angiography) cpt code [phone] She states that prior auths are being handled by medicare clinical intake team and provided their number # [phone])  Notified I would send this message to Dr.Munley's nurse Garnett Farm said to call her back with any questions.

## 2019-10-21 NOTE — Telephone Encounter (Signed)
Per [employer] a pre-cert is required and one is not on file.  She said one needs to be started.

## 2019-10-21 NOTE — Telephone Encounter (Signed)
Attempted to call Garnett Farm back to clarify what needs a pre-cert. [id number]. This is a Dr.Munley pt, will send to his primary RN

## 2019-10-21 NOTE — Telephone Encounter (Signed)
Called the number provided just now and spoke with [place]. I was told that as of right now there was nothing that she could see that needed to be done at this time but we may receive a call back from whoever it was that called in the first place. She did not know anyone by the name of "Sher". I told her that I believed that this was a misspelling but she does not know anyone that would have a name even close to that. I will watch out for another call but at this time am unsure where to go with this information.

## 2019-10-21 NOTE — Telephone Encounter (Signed)
I just sent a message to pre-cert to get this done for the patients cardiac cath.

## 2019-10-22 ENCOUNTER — Other Ambulatory Visit (HOSPITAL_COMMUNITY)
Admission: RE | Admit: 2019-10-22 | Discharge: 2019-10-22 | Disposition: A | Discharge status: Home / Self Care | Payer: Medicare HMO | Source: Ambulatory Visit | Attending: Cardiology | Admitting: Cardiology

## 2019-10-22 DIAGNOSIS — Z01812 Encounter for preprocedural laboratory examination: Secondary | ICD-10-CM | POA: Insufficient documentation

## 2019-10-22 DIAGNOSIS — Z20822 Contact with and (suspected) exposure to covid-19: Secondary | ICD-10-CM | POA: Diagnosis not present

## 2019-10-22 LAB — SARS CORONAVIRUS 2 (TAT 6-24 HRS): SARS Coronavirus 2: NEGATIVE

## 2019-10-24 ENCOUNTER — Telehealth: Payer: Self-pay

## 2019-10-24 NOTE — Telephone Encounter (Signed)
Attempted to call patient to review the pre-cath instructions below. Patient did not answer the phone. Left message for the patient to call us back.  Pt contacted pre-catheterization scheduled at [facility] for:12:00 noon  Verified arrival time and place: [facility]) at:10:00 am  No solid food after midnight prior to cath, clear liquids until 5 AM day of procedure.  AM meds can be  taken pre-cath with sips of water including: ASA 81 mg  Hold Lasix the morning of the procedure  Take Plavix in the am the morning of the procedure prior to arrival regardless of the schedule  Hold Eliquis two days before the procedure  Confirmed patient has responsible adult to drive home post procedure and observe 24 hours after arriving home:   You are allowed ONE visitor in the waiting room during your procedure. Both you and your visitor must wear masks.      COVID-19 Pre-Screening Questions:  . In the past 7 to 10 days have you had a new cough, shortness of breath, headache, congestion, fever (100 or greater) unexplained body aches, new sore throat, or sudden loss of taste or sense of smell? Marland Kitchen In the past 7 to 10 days have you been around anyone with known Covid 19?

## 2019-10-25 ENCOUNTER — Other Ambulatory Visit: Payer: Self-pay

## 2019-10-25 ENCOUNTER — Ambulatory Visit (HOSPITAL_COMMUNITY)
Admission: RE | Admit: 2019-10-25 | Discharge: 2019-10-25 | Disposition: A | Discharge status: Home / Self Care | Payer: Medicare HMO | Attending: Cardiology | Admitting: Cardiology

## 2019-10-25 ENCOUNTER — Encounter (HOSPITAL_COMMUNITY)
Admission: RE | Disposition: A | Discharge status: Home / Self Care | Payer: Self-pay | Source: Home / Self Care | Attending: Cardiology

## 2019-10-25 DIAGNOSIS — Z8673 Personal history of transient ischemic attack (TIA), and cerebral infarction without residual deficits: Secondary | ICD-10-CM | POA: Diagnosis not present

## 2019-10-25 DIAGNOSIS — Z7902 Long term (current) use of antithrombotics/antiplatelets: Secondary | ICD-10-CM | POA: Diagnosis not present

## 2019-10-25 DIAGNOSIS — G629 Polyneuropathy, unspecified: Secondary | ICD-10-CM | POA: Diagnosis not present

## 2019-10-25 DIAGNOSIS — I25118 Atherosclerotic heart disease of native coronary artery with other forms of angina pectoris: Secondary | ICD-10-CM

## 2019-10-25 DIAGNOSIS — I480 Paroxysmal atrial fibrillation: Secondary | ICD-10-CM | POA: Diagnosis present

## 2019-10-25 DIAGNOSIS — I340 Nonrheumatic mitral (valve) insufficiency: Secondary | ICD-10-CM | POA: Insufficient documentation

## 2019-10-25 DIAGNOSIS — N400 Enlarged prostate without lower urinary tract symptoms: Secondary | ICD-10-CM | POA: Insufficient documentation

## 2019-10-25 DIAGNOSIS — E039 Hypothyroidism, unspecified: Secondary | ICD-10-CM | POA: Insufficient documentation

## 2019-10-25 DIAGNOSIS — I110 Hypertensive heart disease with heart failure: Secondary | ICD-10-CM | POA: Diagnosis not present

## 2019-10-25 DIAGNOSIS — K219 Gastro-esophageal reflux disease without esophagitis: Secondary | ICD-10-CM | POA: Insufficient documentation

## 2019-10-25 DIAGNOSIS — Z79899 Other long term (current) drug therapy: Secondary | ICD-10-CM | POA: Diagnosis not present

## 2019-10-25 DIAGNOSIS — E782 Mixed hyperlipidemia: Secondary | ICD-10-CM | POA: Diagnosis not present

## 2019-10-25 DIAGNOSIS — Z955 Presence of coronary angioplasty implant and graft: Secondary | ICD-10-CM | POA: Insufficient documentation

## 2019-10-25 DIAGNOSIS — I5032 Chronic diastolic (congestive) heart failure: Secondary | ICD-10-CM | POA: Diagnosis not present

## 2019-10-25 DIAGNOSIS — I25110 Atherosclerotic heart disease of native coronary artery with unstable angina pectoris: Secondary | ICD-10-CM | POA: Diagnosis present

## 2019-10-25 DIAGNOSIS — Z79890 Hormone replacement therapy (postmenopausal): Secondary | ICD-10-CM | POA: Insufficient documentation

## 2019-10-25 DIAGNOSIS — I252 Old myocardial infarction: Secondary | ICD-10-CM | POA: Diagnosis not present

## 2019-10-25 DIAGNOSIS — I714 Abdominal aortic aneurysm, without rupture: Secondary | ICD-10-CM | POA: Insufficient documentation

## 2019-10-25 DIAGNOSIS — R5383 Other fatigue: Secondary | ICD-10-CM | POA: Diagnosis not present

## 2019-10-25 DIAGNOSIS — Z87891 Personal history of nicotine dependence: Secondary | ICD-10-CM | POA: Insufficient documentation

## 2019-10-25 DIAGNOSIS — I739 Peripheral vascular disease, unspecified: Secondary | ICD-10-CM | POA: Diagnosis not present

## 2019-10-25 DIAGNOSIS — R7303 Prediabetes: Secondary | ICD-10-CM | POA: Diagnosis not present

## 2019-10-25 DIAGNOSIS — Z951 Presence of aortocoronary bypass graft: Secondary | ICD-10-CM

## 2019-10-25 DIAGNOSIS — M109 Gout, unspecified: Secondary | ICD-10-CM | POA: Diagnosis not present

## 2019-10-25 DIAGNOSIS — Z8249 Family history of ischemic heart disease and other diseases of the circulatory system: Secondary | ICD-10-CM | POA: Insufficient documentation

## 2019-10-25 DIAGNOSIS — I25810 Atherosclerosis of coronary artery bypass graft(s) without angina pectoris: Secondary | ICD-10-CM | POA: Diagnosis not present

## 2019-10-25 DIAGNOSIS — I10 Essential (primary) hypertension: Secondary | ICD-10-CM | POA: Diagnosis present

## 2019-10-25 DIAGNOSIS — I200 Unstable angina: Secondary | ICD-10-CM | POA: Diagnosis present

## 2019-10-25 DIAGNOSIS — M1990 Unspecified osteoarthritis, unspecified site: Secondary | ICD-10-CM | POA: Insufficient documentation

## 2019-10-25 HISTORY — PX: LEFT HEART CATH AND CORS/GRAFTS ANGIOGRAPHY: CATH118250

## 2019-10-25 SURGERY — LEFT HEART CATH AND CORS/GRAFTS ANGIOGRAPHY
Anesthesia: LOCAL

## 2019-10-25 MED ORDER — SODIUM CHLORIDE 0.9 % IV SOLN: 250.0000 mL | INTRAVENOUS | Status: DC | PRN

## 2019-10-25 MED ORDER — HEPARIN SODIUM (PORCINE) 1000 UNIT/ML IJ SOLN
INTRAMUSCULAR | Status: DC | PRN
  Administered 2019-10-25: 4000 [IU] via INTRAVENOUS

## 2019-10-25 MED ORDER — ASPIRIN 81 MG PO CHEW
81.0000 mg | CHEWABLE_TABLET | ORAL | Status: AC
  Administered 2019-10-25: 81 mg via ORAL
  Filled 2019-10-25: qty 1

## 2019-10-25 MED ORDER — SODIUM CHLORIDE 0.9 % IV SOLN: INTRAVENOUS | Status: DC

## 2019-10-25 MED ORDER — HYDRALAZINE HCL 20 MG/ML IJ SOLN: 10.0000 mg | INTRAMUSCULAR | Status: DC | PRN

## 2019-10-25 MED ORDER — SODIUM CHLORIDE 0.9 % WEIGHT BASED INFUSION
3.0000 mL/kg/h | INTRAVENOUS | Status: DC
  Administered 2019-10-25: 3 mL/kg/h via INTRAVENOUS

## 2019-10-25 MED ORDER — MIDAZOLAM HCL 2 MG/2ML IJ SOLN
INTRAMUSCULAR | Status: DC | PRN
  Administered 2019-10-25: 2 mg via INTRAVENOUS

## 2019-10-25 MED ORDER — SODIUM CHLORIDE 0.9% FLUSH: 3.0000 mL | Freq: Two times a day (BID) | INTRAVENOUS | Status: DC

## 2019-10-25 MED ORDER — VERAPAMIL HCL 2.5 MG/ML IV SOLN
INTRAVENOUS | Status: DC | PRN
  Administered 2019-10-25: 10 mL via INTRA_ARTERIAL

## 2019-10-25 MED ORDER — SODIUM CHLORIDE 0.9% FLUSH: 3.0000 mL | INTRAVENOUS | Status: DC | PRN

## 2019-10-25 MED ORDER — FENTANYL CITRATE (PF) 100 MCG/2ML IJ SOLN
INTRAMUSCULAR | Status: AC
  Filled 2019-10-25: qty 2

## 2019-10-25 MED ORDER — VERAPAMIL HCL 2.5 MG/ML IV SOLN
INTRAVENOUS | Status: AC
  Filled 2019-10-25: qty 2

## 2019-10-25 MED ORDER — SODIUM CHLORIDE 0.9 % WEIGHT BASED INFUSION: 1.0000 mL/kg/h | INTRAVENOUS | Status: DC

## 2019-10-25 MED ORDER — LIDOCAINE HCL (PF) 1 % IJ SOLN
INTRAMUSCULAR | Status: AC
  Filled 2019-10-25: qty 30

## 2019-10-25 MED ORDER — HEPARIN (PORCINE) IN NACL 1000-0.9 UT/500ML-% IV SOLN
INTRAVENOUS | Status: DC | PRN
  Administered 2019-10-25 (×2): 500 mL

## 2019-10-25 MED ORDER — LIDOCAINE HCL (PF) 1 % IJ SOLN
INTRAMUSCULAR | Status: DC | PRN
  Administered 2019-10-25: 2 mL via INTRADERMAL

## 2019-10-25 MED ORDER — FENTANYL CITRATE (PF) 100 MCG/2ML IJ SOLN
INTRAMUSCULAR | Status: DC | PRN
  Administered 2019-10-25: 25 ug via INTRAVENOUS

## 2019-10-25 MED ORDER — IOHEXOL 350 MG/ML SOLN
INTRAVENOUS | Status: DC | PRN
  Administered 2019-10-25: 160 mL via INTRA_ARTERIAL

## 2019-10-25 MED ORDER — ACETAMINOPHEN 325 MG PO TABS: 650.0000 mg | ORAL_TABLET | ORAL | Status: DC | PRN

## 2019-10-25 MED ORDER — MIDAZOLAM HCL 2 MG/2ML IJ SOLN
INTRAMUSCULAR | Status: AC
  Filled 2019-10-25: qty 2

## 2019-10-25 MED ORDER — ONDANSETRON HCL 4 MG/2ML IJ SOLN: 4.0000 mg | Freq: Four times a day (QID) | INTRAMUSCULAR | Status: DC | PRN

## 2019-10-25 SURGICAL SUPPLY — 10 items
CATH INFINITI 5FR MULTPACK ANG (CATHETERS) ×1 IMPLANT
DEVICE RAD COMP TR BAND LRG (VASCULAR PRODUCTS) ×1 IMPLANT
GLIDESHEATH SLEND SS 6F .021 (SHEATH) ×1 IMPLANT
GUIDEWIRE INQWIRE 1.5J.035X260 (WIRE) IMPLANT
INQWIRE 1.5J .035X260CM (WIRE) ×2
KIT HEART LEFT (KITS) ×2 IMPLANT
PACK CARDIAC CATHETERIZATION (CUSTOM PROCEDURE TRAY) ×2 IMPLANT
SHEATH PROBE COVER 6X72 (BAG) ×1 IMPLANT
TRANSDUCER W/STOPCOCK (MISCELLANEOUS) ×2 IMPLANT
TUBING CIL FLEX 10 FLL-RA (TUBING) ×2 IMPLANT

## 2019-10-25 NOTE — Progress Notes (Signed)
Discharge instructions reviewed with pt and his wife (via telephone) both voice understanding.  

## 2019-10-25 NOTE — Discharge Instructions (Signed)
Radial Site Care  This sheet gives you information about how to care for yourself after your procedure. Your health care provider may also give you more specific instructions. If you have problems or questions, contact your health care provider. What can I expect after the procedure? After the procedure, it is common to have:  Bruising and tenderness at the catheter insertion area. Follow these instructions at home: Medicines  Take over-the-counter and prescription medicines only as told by your health care provider. Insertion site care  Follow instructions from your health care provider about how to take care of your insertion site. Make sure you: ? Wash your hands with soap and water before you change your bandage (dressing). If soap and water are not available, use hand sanitizer. ? Change your dressing as told by your health care provider. ? Leave stitches (sutures), skin glue, or adhesive strips in place. These skin closures may need to stay in place for 2 weeks or longer. If adhesive strip edges start to loosen and curl up, you may trim the loose edges. Do not remove adhesive strips completely unless your health care provider tells you to do that.  Check your insertion site every day for signs of infection. Check for: ? Redness, swelling, or pain. ? Fluid or blood. ? Pus or a bad smell. ? Warmth.  Do not take baths, swim, or use a hot tub until your health care provider approves.  You may shower 24-48 hours after the procedure, or as directed by your health care provider. ? Remove the dressing and gently wash the site with plain soap and water. ? Pat the area dry with a clean towel. ? Do not rub the site. That could cause bleeding.  Do not apply powder or lotion to the site. Activity   For 24 hours after the procedure, or as directed by your health care provider: ? Do not flex or bend the affected arm. ? Do not push or pull heavy objects with the affected arm. ? Do not  drive yourself home from the hospital or clinic. You may drive 24 hours after the procedure unless your health care provider tells you not to. ? Do not operate machinery or power tools.  Do not lift anything that is heavier than 10 lb (4.5 kg), or the limit that you are told, until your health care provider says that it is safe.  Ask your health care provider when it is okay to: ? Return to work or school. ? Resume usual physical activities or sports. ? Resume sexual activity. General instructions  If the catheter site starts to bleed, raise your arm and put firm pressure on the site. If the bleeding does not stop, get help right away. This is a medical emergency.  If you went home on the same day as your procedure, a responsible adult should be with you for the first 24 hours after you arrive home.  Keep all follow-up visits as told by your health care provider. This is important. Contact a health care provider if:  You have a fever.  You have redness, swelling, or yellow drainage around your insertion site. Get help right away if:  You have unusual pain at the radial site.  The catheter insertion area swells very fast.  The insertion area is bleeding, and the bleeding does not stop when you hold steady pressure on the area.  Your arm or hand becomes pale, cool, tingly, or numb. These symptoms may represent a serious problem   that is an emergency. Do not wait to see if the symptoms will go away. Get medical help right away. Call your local emergency services (911 in the [place] Do not drive yourself to the hospital. Summary  After the procedure, it is common to have bruising and tenderness at the site.  Follow instructions from your health care provider about how to take care of your radial site wound. Check the wound every day for signs of infection.  Do not lift anything that is heavier than 10 lb (4.5 kg), or the limit that you are told, until your health care provider says  that it is safe. This information is not intended to replace advice given to you by your health care provider. Make sure you discuss any questions you have with your health care provider. Document Revised: 05/20/2017 Document Reviewed: 05/20/2017 Elsevier Patient Education  2020 [employer]  

## 2019-10-25 NOTE — Interval H&P Note (Signed)
History and Physical Interval Note:  10/25/2019 12:32 PM  Ian Cummings  has presented today for surgery, with the diagnosis of progressive/unstable angina.  The various methods of treatment have been discussed with the patient and family. After consideration of risks, benefits and other options for treatment, the patient has consented to  Procedure(s): LEFT HEART CATH AND CORONARY ANGIOGRAPHY (N/A), GRAFT ANGIOGRAPHY  PERCUTANEOUS CORONARY INTERVENTION    as a surgical intervention.  The patient's history has been reviewed, patient examined, no change in status, stable for surgery.  I have reviewed the patient's chart and labs.  Questions were answered to the patient's satisfaction.    Cath Lab Visit (complete for each Cath Lab visit)  Clinical Evaluation Leading to the Procedure:   ACS: No.  Non-ACS:    Anginal Classification: CCS IV  Anti-ischemic medical therapy: Maximal Therapy (2 or more classes of medications)  Non-Invasive Test Results: No non-invasive testing performed  Prior CABG: Previous CABG    Ian Cummings

## 2019-10-25 NOTE — Brief Op Note (Signed)
10/25/2019  2:13 PM  PATIENT:  Ian Cummings  86 y.o. male  PRE-OPERATIVE DIAGNOSIS:  cad  POST-OPERATIVE DIAGNOSIS:    Severe native disease: Known 100 % occluded proximal RCA and proximal LCx as well as LAD after SP1 then D2 (D2 and SB to fill retrograde via LIMA-LAD is widely patent) with a 70% eccentric lesion just proximal to D1, patent proximal limb of SVG-OM-2 (occluded second OM to OM 3), known occlusion of old SVG-RCA but with likely culprit lesion being 1 and percent thrombotic occlusion of new SVG-RPDA.  Collateral flow fills from SP1 SP2 PDA septals and from distal circumflex collaterals to RPL -> provides retrograde filling of the RCA at least back to the distal bend  Preserved LVEF with basal to mid inferior hypokinesis but otherwise EF of 50 to 55%  Normal LVEDP   PROCEDURE:  Procedure(s): LEFT HEART CATH AND CORONARY ANGIOGRAPHY (N/A)/and GRAFT ANGIOGRAPHY  Left radial access-6 [place] sheath (ultrasound-guided) -> JR4, JL4 catheters used  Native RCA and all grafts as well as LV gram using JR4 catheter  Native LCA using JL4 catheter   SURGEON:  Surgeon(s) and Role:    * Leonie Man, MD - Primary  ANESTHESIA:   local and IV sedation -> 2 mL subcu lidocaine for radial access; 2 mg Versed and 25 mg fentanyl IV  EBL:  <20 mL  CONTRAST USED: 160 mL  TR BAND: 12 mL air, 3000 hours  DICTATION: .Note written in EPIC  PLAN OF CARE: Discharge to home after PACU   Plan will be to optimize medical management as the likely culprit lesion is the occluded SVG-RCA.  If symptoms do not improve on medical therapy, can consider bifurcation PCI of the lesion just prior to D1 in the LAD.  (Somewhat complicated by the fact that the distal LAD beyond D1 is a size mismatch, and there is diffuse disease crossing SP1 and D2)  PATIENT DISPOSITION:  PACU - hemodynamically stable.   Delay start of Pharmacological VTE agent (>24hrs) due to surgical blood loss or risk of bleeding:  not applicable   [name], M.D., M.S. Interventional Cardiologist   Pager # [phone] Phone # [phone] [address]. [place], [place] [address]

## 2019-10-26 ENCOUNTER — Telehealth: Payer: Self-pay | Admitting: Cardiology

## 2019-10-26 NOTE — Telephone Encounter (Signed)
Dr. Ellyn Hack phoned me yesterday he felt at this time medications were the correct approach and if he is having frequent angina he could try angioplasty.  He also reviewed the films of the hospital Dr. Tamala Julian I think this is the best approach

## 2019-10-26 NOTE — Telephone Encounter (Signed)
New Message:     Wife called, she would like to talk to Dr Ellyn Hack. Dr Ellyn Hack did pt's procedure yesterday and she needs clarification on what he said about the results.

## 2019-10-26 NOTE — Telephone Encounter (Signed)
Wife wanted to speak to Dr. Bettina Gavia or his Nurse about the results of the patient's heart cath that ws done yesterday. The wife and the patient received conflicting information from Dr. Ellyn Hack and she would like to get a straight answer. The wife was told over the phone that it could be fixed but he needed to communicate with Dr. Bettina Gavia first. The Patient was told that he would just need to take medication.   The wife is worried that waiting two weeks to see Dr. Bettina Gavia is too long

## 2019-10-26 NOTE — Telephone Encounter (Signed)
Spoke to the patients wife just now and let her know that Dr. Bettina Gavia spoke with Dr. Ellyn Hack and at this time they felt that it was best for the patient to pursue medication management as the best approach. I also let her know that it was mentioned that if the patient experiences frequent angina episodes that they could try an intervention such as angioplasty. She verbalizes understanding and states that she is concerned that they should not wait two weeks to be seen. I let her know that if any medications needed to change in-between now and their appointment in two weeks Dr. Bettina Gavia and Dr. Ellyn Hack would have let them know. She verbalizes understanding and thanks me for the call back.    Encouraged patient to call back with any questions or concerns.

## 2019-10-27 ENCOUNTER — Encounter (HOSPITAL_COMMUNITY): Payer: Self-pay | Admitting: Cardiology

## 2019-11-03 DIAGNOSIS — I482 Chronic atrial fibrillation, unspecified: Secondary | ICD-10-CM | POA: Diagnosis not present

## 2019-11-08 NOTE — Progress Notes (Signed)
Cardiology Office Note:    Date:  11/09/2019   ID:  Ian Cummings, DOB Sep 13, 1928, MRN [id number]  PCP:  Marton Redwood, MD  Cardiologist:  Shirlee More, MD    Referring MD: Marton Redwood, MD    ASSESSMENT:    1. PAF (paroxysmal atrial fibrillation) ([place])   2. Chronic anticoagulation   3. Coronary artery disease of native artery of native heart with stable angina pectoris ([place])   4. Hypertensive heart disease with chronic diastolic congestive heart failure ([place])   5. Nonrheumatic mitral valve regurgitation   6. Mixed hyperlipidemia    PLAN:    In order of problems listed above:  1. Stable no clinical recurrence continue his anticoagulant beta-blocker 2. New combined clopidogrel Eliquis with recent ACS and atrial fibrillation 3. Stable having no angina I think he is having significant side effects from ranolazine discontinue encouraged him to accept cardiac rehabilitation I think is on a good medical regimen with oral nitrates calcium channel blocker beta-blocker clopidogrel and high intensity statin 4. Heart failure is compensated reduce diuretic as long as he weighs daily and takes doses if needed on off days 5. Stable mitral regurgitation is secondary functional not severe I would not advise intervention 6. Continue his high intensity statin 7. PAD he will be seeing vascular surgery next week   Next appointment: 6 weeks   Medication Adjustments/Labs and Tests Ordered: Current medicines are reviewed at length with the patient today.  Concerns regarding medicines are outlined above.  No orders of the defined types were placed in this encounter.  No orders of the defined types were placed in this encounter.   Chief Complaint  Patient presents with   Follow-up   Coronary Artery Disease   Congestive Heart Failure   Atrial Fibrillation   Anticoagulation   Mitral Regurgitation    History of Present Illness:    Ian Cummings is a 86 y.o. male with a hx of   coronary artery disease status post CABG as well as PCI, hyperlipidemia, hypertension, peripheral artery disease with right lower extremity femoral-popliteal bypass graft which was widely patent on duplex 10/17/2019 and right ICA stent without restenosis hypothyroidism seen 02/01/2019 by Dr Harriet Masson for MR and SOB.  I independently reviewed his echocardiogram he has hypokinesia of the basilar inferior wall and septum has mitral regurgitation which is moderate it is central very narrow vena contracta and unfortunately pulmonary vein was not evaluated.  I do not see criteria for severe mitral regurgitation.  Aortic valve is mildly thickened and restricted and when I reviewed the study there is a minimal gradient with peak and mean gradients are in the range of 20 and 9 mmHg.     He was last seen 10/07/2019 with concerns of recent hospitalization with unstable angina pectoris heart failure mitral regurgitation and paroxysmal atrial fibrillation.  He was initiated on anticoagulation I advised him undergo coronary angiography and he declined because of a planned trip to [place] with his son.  He subsequent was seen in the emergency room 10/12/2019 with palpitation and was discharged home.   .  After admission to the hospital with unstable angina pectoris and mild troponin elevation.     6 d ago  (10/12/19) 6 d ago  (10/12/19) 1 mo ago  (09/10/19)    Troponin I (High Sensitivity) <18 ng/L 25 High   22 High  CM  23 High     He has CKD chronic stage III He was last seen 10/19/2019 referred  for coronary angiography.  Compliance with diet, lifestyle and medications: Yes  Is a very complex visit.  His wife is present.  They did not have a good understanding of the results of his coronary angiogram I reviewed with him and the recommendation that if he is having frequent angina to consider PCI and stenting of his native LAD.  Intervention on his native right vein graft is not an option.  His concern is that he has  become very listless dizzy unsteady and had a blood sugar of 80 at home he is not diabetic and does not take diabetic medication but he does take ranolazine that can cause CNS side effects of disequilibrium and has a blood sugar lowering effect and I think he needs to get off of it.  He complains of generalized weakness his heart failure is compensated he has no edema and I reduce the dose of his diuretic as long as he weighs daily and adjust at home.  Fortunately has had no angina.  I encouraged him to continue medical therapy and to enroll in cardiac rehabilitation.  At this point in life his biggest problem is claudication of the left leg when he takes the trash outside longer distance 5240 is to stop and rest.  He wants to hold on cardiac rehab until he is seen by vascular surgery next week.  With his recent ACS heart failure and atrial fibrillation with anticoagulation I told be hesitant to see him undergoing surgery but I think he would be a good candidate if possible for PCI.  Also told him the supervised exercise is very helpful for PAD.  He is on an antidepressant and he seems to be dispirited with a flat affect and a depressed mood.  10/25/2019: Coronary Diagrams  Diagnostic Dominance: Right  Conclusion    Prox RCA to Dist RCA lesion is 100% stenosed.  Prox Cx to Dist Cx lesion is 100% stenosed with 100% stenosed side branch in 2nd Mrg.  Prox LAD lesion is 75% stenosed. 2nd Diag lesion is 70% stenosed.  Mid LAD-1 lesion is 65% stenosed with 60% stenosed side branch in 1st Sept.  Mid LAD-2 lesion is 99% stenosed / subtotally occluded (no competetive / retrograde flow via LIMA).  LIMA-LAD graft was visualized by angiography and is normal in caliber. The graft exhibits no disease. Dist LAD lesion is 50% stenosed.  Seq SVG- OM2-OM3; (known occluded 2nd Limb) was visualized by angiography and is large.  Origin lesion is 40% stenosed. Prox Graft STENT is 10% stenosed. Mid Graft to Dist  Graft STENT is 20% stenosed.  Original SVG-PDA-PL not injected due to known occlusion is large with extensive stents.  ReDo SVG-dRCA graft was visualized by angiography and is large. Prox Graft to Mid Graft lesion is 100% stenosed.  There is mild to moderate left ventricular systolic dysfunction.  The left ventricular ejection fraction is 45-50% by visual estimate.  There is no aortic valve stenosis.    Severe native disease:  ? Known 100 % occluded proximal RCA and proximal LCx as well as LAD after 1st Sept & 2nd Diag 2nd Diag   Patent LIMA-LAD --> 2nd Sept fill retrograde via LIMA-LAD is widely patent) with a 70% eccentric lesion just proximal to 1st Diag,   Patent proximal limb of SVG-OM-2 (occluded second limb OM to OM 3),   Known occlusion of old SVG-rPDA-PL  Likely culprit lesion being 100% thrombotic occlusion of new SVG-RPDA.  Collateral flow fills from SP1 SP2 PDA septals  and from distal LCx collaterals to RPL -> provides retrograde filling of the RCA at least back to the distal bend  Preserved LVEF with basal to mid inferior hypokinesis but otherwise EF of 50 to 55%  Normal LVEDP   Plan will be to optimize medical management as the likely culprit lesion is the occluded SVG-RCA.  If symptoms do not improve on medical therapy, can consider bifurcation PCI of the lesion just prior to D1 in the LAD.  (Somewhat complicated by the fact that the distal LAD beyond D1 is a size mismatch, and there is diffuse disease crossing SP1 and D2)  Left Heart  Left Ventricle The left ventricular size is in the upper limits of normal. There is mild to moderate left ventricular systolic dysfunction. The left ventricular ejection fraction is 45-50% by visual estimate. There are LV function abnormalities due to segmental dysfunction.  Aortic Valve There is no aortic valve stenosis. The aortic valve is calcified.   He also wore a long-term ambulatory heart rhythm monitor that showed no  bradycardia or recurrent atrial fibrillation Past Medical History:  Diagnosis Date   AAA (abdominal aortic aneurysm) ([place])    2004, Pt stated not there now   Anginal pain ([place])    Anxiety    Arthritis    BPH (benign prostatic hypertrophy)    CAD (coronary artery disease)    Carotid artery disease ([place])    Right carotid stent 2013   Claudication [facility])    Colon polyps    adenomatous   Complex sleep apnea syndrome 11/11/2017   Coronary artery disease    Depression    GERD (gastroesophageal reflux disease)    Gout    H/O hiatal hernia    Hyperlipidemia    Hypertension    Hypothyroidism    MI (myocardial infarction) ([place]) 4/14   Neuropathy    Hx: of in B/L toes   Peripheral vascular disease ([place])    with claudication   Pre-diabetes    Stroke ([place])    TIA (transient ischemic attack)    Hx: of    Past Surgical History:  Procedure Laterality Date   CAROTID STENT INSERTION Right 2013   right at [facility]   CATARACT EXTRACTION Bilateral    Hx: of both eyes   COLONOSCOPY     Hx: of   COLONOSCOPY     CORONARY ANGIOPLASTY WITH STENT PLACEMENT  2010   CORONARY ARTERY BYPASS GRAFT  1995   CORONARY ARTERY BYPASS GRAFT N/A 10/18/2012   Procedure: REDO CORONARY ARTERY BYPASS GRAFTING (CABG);  Surgeon: Grace Isaac, MD;  Location: [place];  Service: Open Heart Surgery;  Laterality: N/A;  off pump times two using endoscopically harvested left saphenous vein   CORONARY STENT PLACEMENT  06/2016   ENDARTERECTOMY FEMORAL Right 08/03/2015   Procedure: RIGHT FEMORAL ENDARTERECTOMY WITH PATCH ANGIOPLASTY;  Surgeon: Serafina Mitchell, MD;  Location: [place];  Service: Vascular;  Laterality: Right;   FEMORAL-POPLITEAL BYPASS GRAFT Right 08/03/2015   Procedure: RIGHT FEMORAL-BELOW KNEE POPLITEAL ARTERY BYPASS GRAFT;  Surgeon: Serafina Mitchell, MD;  Location: [place];  Service: Vascular;  Laterality: Right;   FOOT SURGERY Left    FRACTURE SURGERY Left    Hx:  of Left heel   INGUINAL HERNIA REPAIR Bilateral    X 2    INTRAOPERATIVE TRANSESOPHAGEAL ECHOCARDIOGRAM N/A 10/18/2012   Procedure: INTRAOPERATIVE TRANSESOPHAGEAL ECHOCARDIOGRAM;  Surgeon: Grace Isaac, MD;  Location: [place];  Service: Open Heart Surgery;  Laterality:  N/A;   LEFT HEART CATH AND CORS/GRAFTS ANGIOGRAPHY N/A 10/25/2019   Procedure: LEFT HEART CATH AND CORS/GRAFTS ANGIOGRAPHY;  Surgeon: Leonie Man, MD;  Location: [place] CV LAB;  Service: Cardiovascular;  Laterality: N/A;   LEFT HEART CATHETERIZATION WITH CORONARY/GRAFT ANGIOGRAM N/A 02/02/2013   Procedure: LEFT HEART CATHETERIZATION WITH Beatrix Fetters;  Surgeon: Jacolyn Reedy, MD;  Location: [facility] CATH LAB;  Service: Cardiovascular;  Laterality: N/A;   LOWER EXTREMITY ANGIOGRAM Right 06/27/2015   Procedure: Lower Extremity Angiogram;  Surgeon: Serafina Mitchell, MD;  Location: [place] CV LAB;  Service: Cardiovascular;  Laterality: Right;   MASTOIDECTOMY  1933   PERIPHERAL VASCULAR CATHETERIZATION N/A 01/23/2015   Procedure: Abdominal Aortogram;  Surgeon: Serafina Mitchell, MD;  Location: [place] CV LAB;  Service: Cardiovascular;  Laterality: N/A;   PERIPHERAL VASCULAR CATHETERIZATION N/A 06/27/2015   Procedure: Abdominal Aortogram;  Surgeon: Serafina Mitchell, MD;  Location: [place] CV LAB;  Service: Cardiovascular;  Laterality: N/A;   RETINAL DETACHMENT SURGERY Left    Hx: of left eye   TONSILLECTOMY      Current Medications: Current Meds  Medication Sig   ACCU-CHEK AVIVA PLUS test strip    allopurinol (ZYLOPRIM) 100 MG tablet Take 100 mg by mouth daily.    amLODipine (NORVASC) 5 MG tablet Take 5 mg by mouth daily.    apixaban (ELIQUIS) 5 MG TABS tablet Take 1 tablet (5 mg total) by mouth 2 (two) times daily.   Blood Glucose Monitoring Suppl (ACCU-CHEK AVIVA PLUS) w/Device KIT    clopidogrel (PLAVIX) 75 MG tablet Take 75 mg by mouth daily.   escitalopram (LEXAPRO) 10 MG tablet Take  10 mg by mouth daily.   furosemide (LASIX) 20 MG tablet Take 20 mg by mouth 2 (two) times a week.   isosorbide mononitrate (IMDUR) 60 MG 24 hr tablet Take 1 tablet (60 mg total) by mouth daily.   lansoprazole (PREVACID) 30 MG capsule Take 30 mg by mouth daily.   levothyroxine (SYNTHROID, LEVOTHROID) 88 MCG tablet Take 88 mcg by mouth daily before breakfast.   metoprolol succinate (TOPROL-XL) 50 MG 24 hr tablet Take 50 mg by mouth daily.   multivitamin (RENA-VIT) TABS tablet Take 1 tablet by mouth daily.   nitroGLYCERIN (NITROSTAT) 0.4 MG SL tablet Place 0.4 mg under the tongue every 5 (five) minutes as needed for chest pain.    rosuvastatin (CRESTOR) 40 MG tablet Take 40 mg by mouth daily.   [DISCONTINUED] furosemide (LASIX) 20 MG tablet TAKE 1 TABLET BY MOUTH EVERY DAY (Patient taking differently: 4 (four) times a week. )   [DISCONTINUED] RANEXA 500 MG 12 hr tablet Take 500 mg by mouth 2 (two) times daily.      Allergies:   Amoxicillin and Metoprolol   Social History   Socioeconomic History   Marital status: Married    Spouse name: Not on file   Number of children: 3   Years of education: Not on file   Highest education level: Not on file  Occupational History   Occupation: retired  Tobacco Use   Smoking status: Former Smoker    Types: Cigarettes    Quit date: 04/29/1975    Years since quitting: 44.5   Smokeless tobacco: Never Used  Vaping Use   Vaping Use: Never used  Substance and Sexual Activity   Alcohol use: Yes    Alcohol/week: 3.0 standard drinks    Types: 3 Glasses of wine per week    Comment: 3  glasses of wine today   Drug use: No   Sexual activity: Not Currently  Other Topics Concern   Not on file  Social History Narrative   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)   Social Determinants of Health   Financial Resource Strain:    Difficulty of Paying Living Expenses:   Food Insecurity:    Worried About Charity fundraiser in the Last Year:     Arboriculturist in the Last Year:   Transportation Needs:    Film/video editor (Medical):    Lack of Transportation (Non-Medical):   Physical Activity:    Days of Exercise per Week:    Minutes of Exercise per Session:   Stress:    Feeling of Stress :   Social Connections:    Frequency of Communication with Friends and Family:    Frequency of Social Gatherings with Friends and Family:    Attends Religious Services:    Active Member of Clubs or Organizations:    Attends Music therapist:    Marital Status:      Family History: The patient's family history includes Diabetes in his father; Heart attack in his mother; Heart disease in his father and mother; Hyperlipidemia in his father; Hypertension in his father; Stroke in his sister. There is no history of Colon cancer, Stomach cancer, Rectal cancer, Esophageal cancer, Liver disease, or Kidney disease. ROS:   Please see the history of present illness.    All other systems reviewed and are negative.  EKGs/Labs/Other Studies Reviewed:    The following studies were reviewed today:    Recent Labs: 09/10/2019: B Natriuretic Peptide 284.4 09/12/2019: Magnesium 1.9 10/12/2019: ALT 21 10/19/2019: BUN 29; Creatinine, Ser 1.14; Hemoglobin 12.9; Platelets 185; Potassium 4.7; Sodium 138  Recent Lipid Panel    Component Value Date/Time   CHOL  04/18/2009 0241    109        ATP III CLASSIFICATION:  <200     mg/dL   Desirable  200-239  mg/dL   Borderline High  >=240    mg/dL   High          TRIG 114 04/18/2009 0241   HDL 34 (L) 04/18/2009 0241   CHOLHDL 3.2 04/18/2009 0241   VLDL 23 04/18/2009 0241   LDLCALC  04/18/2009 0241    52        Total Cholesterol/HDL:CHD Risk Coronary Heart Disease Risk Table                     Men   Women  1/2 Average Risk   3.4   3.3  Average Risk       5.0   4.4  2 X Average Risk   9.6   7.1  3 X Average Risk  23.4   11.0        Use the calculated Patient Ratio above and  the CHD Risk Table to determine the patient's CHD Risk.        ATP III CLASSIFICATION (LDL):  <100     mg/dL   Optimal  100-129  mg/dL   Near or Above                    Optimal  130-159  mg/dL   Borderline  160-189  mg/dL   High  >190     mg/dL   Very High    Physical Exam:    VS:  BP 110/72  Pulse 64    Ht 5' 10"  (1.778 m)    Wt 157 lb (71.2 kg)    SpO2 96%    BMI 22.53 kg/m     Wt Readings from Last 3 Encounters:  11/09/19 157 lb (71.2 kg)  10/25/19 161 lb (73 kg)  10/19/19 159 lb 9.6 oz (72.4 kg)     GEN: Flat affect depressed mood well nourished, well developed in no acute distress HEENT: Normal NECK: No JVD; No carotid bruits LYMPHATICS: No lymphadenopathy CARDIAC: Grade 2/6 MR apex RRR, no murmurs, rubs, gallops RESPIRATORY:  Clear to auscultation without rales, wheezing or rhonchi  ABDOMEN: Soft, non-tender, non-distended MUSCULOSKELETAL:  No edema; No deformity  SKIN: Warm and dry NEUROLOGIC:  Alert and oriented x 3 PSYCHIATRIC:  Normal affect    Signed, Shirlee More, MD  11/09/2019 12:12 PM    [place]

## 2019-11-09 ENCOUNTER — Encounter: Payer: Self-pay | Admitting: Cardiology

## 2019-11-09 ENCOUNTER — Ambulatory Visit: Payer: Medicare HMO | Admitting: Cardiology

## 2019-11-09 ENCOUNTER — Other Ambulatory Visit: Payer: Self-pay

## 2019-11-09 VITALS — BP 110/72 | HR 64 | Ht 70.0 in | Wt 157.0 lb

## 2019-11-09 DIAGNOSIS — Z7901 Long term (current) use of anticoagulants: Secondary | ICD-10-CM | POA: Diagnosis not present

## 2019-11-09 DIAGNOSIS — I25118 Atherosclerotic heart disease of native coronary artery with other forms of angina pectoris: Secondary | ICD-10-CM | POA: Diagnosis not present

## 2019-11-09 DIAGNOSIS — I5032 Chronic diastolic (congestive) heart failure: Secondary | ICD-10-CM | POA: Diagnosis not present

## 2019-11-09 DIAGNOSIS — E782 Mixed hyperlipidemia: Secondary | ICD-10-CM | POA: Diagnosis not present

## 2019-11-09 DIAGNOSIS — I110 Hypertensive heart disease with heart failure: Secondary | ICD-10-CM

## 2019-11-09 DIAGNOSIS — I340 Nonrheumatic mitral (valve) insufficiency: Secondary | ICD-10-CM

## 2019-11-09 DIAGNOSIS — I480 Paroxysmal atrial fibrillation: Secondary | ICD-10-CM

## 2019-11-09 NOTE — Patient Instructions (Signed)
Medication Instructions:  Your physician has recommended you make the following change in your medication:  STOP: Ranexa DECREASE: Furosemide 20 mg take one tablet by mouth daily on Monday and Thursday. Take an extra tablet daily on any off days if your weight increases more than 3 lbs.  *If you need a refill on your cardiac medications before your next appointment, please call your pharmacy*   Lab Work: None If you have labs (blood work) drawn today and your tests are completely normal, you will receive your results only by: Marland Kitchen MyChart Message (if you have MyChart) OR . A paper copy in the mail If you have any lab test that is abnormal or we need to change your treatment, we will call you to review the results.   Testing/Procedures: None   Follow-Up: At [facility], you and your health needs are our priority.  As part of our continuing mission to provide you with exceptional heart care, we have created designated Provider Care Teams.  These Care Teams include your primary Cardiologist (physician) and Advanced Practice Providers (APPs -  Physician Assistants and Nurse Practitioners) who all work together to provide you with the care you need, when you need it.  We recommend signing up for the patient portal called "MyChart".  Sign up information is provided on this After Visit Summary.  MyChart is used to connect with patients for Virtual Visits (Telemedicine).  Patients are able to view lab/test results, encounter notes, upcoming appointments, etc.  Non-urgent messages can be sent to your provider as well.   To learn more about what you can do with MyChart, go to [email or web address].    Your next appointment:   6 week(s)  The format for your next appointment:   In Person  Provider:   Shirlee More, MD   Other Instructions

## 2019-11-11 ENCOUNTER — Ambulatory Visit: Payer: Medicare HMO | Admitting: Cardiology

## 2019-11-14 ENCOUNTER — Other Ambulatory Visit: Payer: Self-pay

## 2019-11-14 ENCOUNTER — Ambulatory Visit (HOSPITAL_COMMUNITY)
Admission: RE | Admit: 2019-11-14 | Discharge: 2019-11-14 | Disposition: A | Discharge status: Home / Self Care | Payer: Medicare HMO | Source: Ambulatory Visit | Attending: Physician Assistant | Admitting: Physician Assistant

## 2019-11-14 ENCOUNTER — Ambulatory Visit: Payer: Medicare HMO | Admitting: Physician Assistant

## 2019-11-14 VITALS — BP 103/57 | HR 60 | Temp 97.3°F | Resp 20 | Ht 70.0 in | Wt 158.1 lb

## 2019-11-14 DIAGNOSIS — I7025 Atherosclerosis of native arteries of other extremities with ulceration: Secondary | ICD-10-CM | POA: Diagnosis not present

## 2019-11-14 DIAGNOSIS — I743 Embolism and thrombosis of arteries of the lower extremities: Secondary | ICD-10-CM

## 2019-11-14 DIAGNOSIS — I70219 Atherosclerosis of native arteries of extremities with intermittent claudication, unspecified extremity: Secondary | ICD-10-CM | POA: Insufficient documentation

## 2019-11-14 NOTE — Progress Notes (Signed)
Office Note     CC:  follow up Requesting Provider:  Marton Redwood, MD  HPI: Ian Cummings is a 86 y.o. ([birth year]) male who presents for interval follow-up of left lower extremity pain with exercise.  The patient states his pain has been stable for several months.  He is unable to walk a city block without resting.  He also states bilateral hip arthritis contributes to his inability to continue walking further.  He denies rest pain.    He underwent left heart catheterization via left radial artery due to ACS by Dr. Ellyn Hack on October 25, 2019.  He had been to the emergency department several times due to angina.  His medical history is significant for coronary artery disease, congestive heart failure, atrial fibrillation and mitral regurgitation.   After his heart cath, he followed up in the office with Dr. Bettina Gavia.  Dr. Bettina Gavia discontinued his ranolazine and the patient states this has greatly improved his symptoms.  He is compliant with his Plavix, Eliquis and statin.   Past Medical History:  Diagnosis Date  . AAA (abdominal aortic aneurysm) ([place])    2004, Pt stated not there now  . Anginal pain ([place])   . Anxiety   . Arthritis   . BPH (benign prostatic hypertrophy)   . CAD (coronary artery disease)   . Carotid artery disease ([place])    Right carotid stent 2013  . Claudication ([place])   . Colon polyps    adenomatous  . Complex sleep apnea syndrome 11/11/2017  . Coronary artery disease   . Depression   . GERD (gastroesophageal reflux disease)   . Gout   . H/O hiatal hernia   . Hyperlipidemia   . Hypertension   . Hypothyroidism   . MI (myocardial infarction) ([place]) 4/14  . Neuropathy    Hx: of in B/L toes  . Peripheral vascular disease ([place])    with claudication  . Pre-diabetes   . Stroke ([place])   . TIA (transient ischemic attack)    Hx: of    Past Surgical History:  Procedure Laterality Date  . CAROTID STENT INSERTION Right 2013   right at [facility]  . CATARACT  EXTRACTION Bilateral    Hx: of both eyes  . COLONOSCOPY     Hx: of  . COLONOSCOPY    . CORONARY ANGIOPLASTY WITH STENT PLACEMENT  2010  . CORONARY ARTERY BYPASS GRAFT  1995  . CORONARY ARTERY BYPASS GRAFT N/A 10/18/2012   Procedure: REDO CORONARY ARTERY BYPASS GRAFTING (CABG);  Surgeon: Grace Isaac, MD;  Location: [place];  Service: Open Heart Surgery;  Laterality: N/A;  off pump times two using endoscopically harvested left saphenous vein  . CORONARY STENT PLACEMENT  06/2016  . ENDARTERECTOMY FEMORAL Right 08/03/2015   Procedure: RIGHT FEMORAL ENDARTERECTOMY WITH PATCH ANGIOPLASTY;  Surgeon: Serafina Mitchell, MD;  Location: [place];  Service: Vascular;  Laterality: Right;  . FEMORAL-POPLITEAL BYPASS GRAFT Right 08/03/2015   Procedure: RIGHT FEMORAL-BELOW KNEE POPLITEAL ARTERY BYPASS GRAFT;  Surgeon: Serafina Mitchell, MD;  Location: [place];  Service: Vascular;  Laterality: Right;  . FOOT SURGERY Left   . FRACTURE SURGERY Left    Hx: of Left heel  . INGUINAL HERNIA REPAIR Bilateral    X 2   . INTRAOPERATIVE TRANSESOPHAGEAL ECHOCARDIOGRAM N/A 10/18/2012   Procedure: INTRAOPERATIVE TRANSESOPHAGEAL ECHOCARDIOGRAM;  Surgeon: Grace Isaac, MD;  Location: [place];  Service: Open Heart Surgery;  Laterality: N/A;  . LEFT HEART CATH AND  CORS/GRAFTS ANGIOGRAPHY N/A 10/25/2019   Procedure: LEFT HEART CATH AND CORS/GRAFTS ANGIOGRAPHY;  Surgeon: Leonie Man, MD;  Location: [place] CV LAB;  Service: Cardiovascular;  Laterality: N/A;  . LEFT HEART CATHETERIZATION WITH CORONARY/GRAFT ANGIOGRAM N/A 02/02/2013   Procedure: LEFT HEART CATHETERIZATION WITH Beatrix Fetters;  Surgeon: Jacolyn Reedy, MD;  Location: [facility] CATH LAB;  Service: Cardiovascular;  Laterality: N/A;  . LOWER EXTREMITY ANGIOGRAM Right 06/27/2015   Procedure: Lower Extremity Angiogram;  Surgeon: Serafina Mitchell, MD;  Location: [place] CV LAB;  Service: Cardiovascular;  Laterality: Right;  . MASTOIDECTOMY  1933  . PERIPHERAL  VASCULAR CATHETERIZATION N/A 01/23/2015   Procedure: Abdominal Aortogram;  Surgeon: Serafina Mitchell, MD;  Location: [place] CV LAB;  Service: Cardiovascular;  Laterality: N/A;  . PERIPHERAL VASCULAR CATHETERIZATION N/A 06/27/2015   Procedure: Abdominal Aortogram;  Surgeon: Serafina Mitchell, MD;  Location: [place] CV LAB;  Service: Cardiovascular;  Laterality: N/A;  . RETINAL DETACHMENT SURGERY Left    Hx: of left eye  . TONSILLECTOMY      Social History   Socioeconomic History  . Marital status: Married    Spouse name: Not on file  . Number of children: 3  . Years of education: Not on file  . Highest education level: Not on file  Occupational History  . Occupation: retired  Tobacco Use  . Smoking status: Former Smoker    Types: Cigarettes    Quit date: 04/29/1975    Years since quitting: 44.5  . Smokeless tobacco: Never Used  Vaping Use  . Vaping Use: Never used  Substance and Sexual Activity  . Alcohol use: Yes    Alcohol/week: 3.0 standard drinks    Types: 3 Glasses of wine per week    Comment: 3 glasses of wine today  . Drug use: No  . Sexual activity: Not Currently  Other Topics Concern  . 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 Difficulty of Paying Living Expenses:   Food Insecurity:   . Worried About Charity fundraiser in the Last Year:   . Arboriculturist in the Last Year:   Transportation Needs:   . Film/video editor (Medical):   Marland Kitchen Lack of Transportation (Non-Medical):   Physical Activity:   . Days of Exercise per Week:   . Minutes of Exercise per Session:   Stress:   . Feeling of Stress :   Social Connections:   . Frequency of Communication with Friends and Family:   . Frequency of Social Gatherings with Friends and Family:   . Attends Religious Services:   . Active Member of Clubs or Organizations:   . Attends Archivist Meetings:   Marland Kitchen  Marital Status:   Intimate Partner Violence:   . Fear of Current or Ex-Partner:   . Emotionally Abused:   Marland Kitchen Physically Abused:   . Sexually Abused:    Family History  Problem Relation Age of Onset  . Heart disease Mother        After 17 yrs of age  . Heart attack Mother   . Hyperlipidemia Father   . Hypertension Father   . Heart disease Father        After 46 yrs of age  . Diabetes Father   . Stroke Sister   . Colon cancer Neg Hx   . Stomach cancer  Neg Hx   . Rectal cancer Neg Hx   . Esophageal cancer Neg Hx   . Liver disease Neg Hx   . Kidney disease Neg Hx     Current Outpatient Medications  Medication Sig Dispense Refill  . ACCU-CHEK AVIVA PLUS test strip     . allopurinol (ZYLOPRIM) 100 MG tablet Take 100 mg by mouth daily.     Marland Kitchen amLODipine (NORVASC) 5 MG tablet Take 5 mg by mouth daily.     Marland Kitchen apixaban (ELIQUIS) 5 MG TABS tablet Take 1 tablet (5 mg total) by mouth 2 (two) times daily. 180 tablet 3  . Blood Glucose Monitoring Suppl (ACCU-CHEK AVIVA PLUS) w/Device KIT     . clopidogrel (PLAVIX) 75 MG tablet Take 75 mg by mouth daily.    Marland Kitchen escitalopram (LEXAPRO) 10 MG tablet Take 10 mg by mouth daily.    . furosemide (LASIX) 20 MG tablet Take 20 mg by mouth 2 (two) times a week.    . isosorbide mononitrate (IMDUR) 60 MG 24 hr tablet Take 1 tablet (60 mg total) by mouth daily. 90 tablet 1  . lansoprazole (PREVACID) 30 MG capsule Take 30 mg by mouth daily.    Marland Kitchen levothyroxine (SYNTHROID, LEVOTHROID) 88 MCG tablet Take 88 mcg by mouth daily before breakfast.    . metoprolol succinate (TOPROL-XL) 50 MG 24 hr tablet Take 50 mg by mouth daily.    . multivitamin (RENA-VIT) TABS tablet Take 1 tablet by mouth daily.    . nitroGLYCERIN (NITROSTAT) 0.4 MG SL tablet Place 0.4 mg under the tongue every 5 (five) minutes as needed for chest pain.     . rosuvastatin (CRESTOR) 40 MG tablet Take 40 mg by mouth daily.     No current facility-administered medications for this visit.     Allergies  Allergen Reactions  . Amoxicillin Rash  . Metoprolol Itching    When out in the sun, pt is currently taking.     REVIEW OF SYSTEMS:   _0  denotes positive finding, _1  denotes negative finding Cardiac  Comments:  Chest pain or chest pressure:    Shortness of breath upon exertion:    Short of breath when lying flat:    Irregular heart rhythm:        Vascular    Pain in calf, thigh, or hip brought on by ambulation: x   Pain in feet at night that wakes you up from your sleep:     Blood clot in your veins:    Leg swelling:         Pulmonary    Oxygen at home:    Productive cough:     Wheezing:         Neurologic    Sudden weakness in arms or legs:     Sudden numbness in arms or legs:     Sudden onset of difficulty speaking or slurred speech:    Temporary loss of vision in one eye:     Problems with dizziness:         Gastrointestinal    Blood in stool:     Vomited blood:         Genitourinary    Burning when urinating:     Blood in urine:        Psychiatric    Major depression:         Hematologic    Bleeding problems:    Problems with blood clotting too easily:  Skin    Rashes or ulcers:        Constitutional    Fever or chills:      PHYSICAL EXAMINATION:  Vitals:   11/14/19 1536  BP: (!) 103/57  Pulse: 60  Resp: 20  Temp: (!) 97.3 F (36.3 C)  TempSrc: Temporal  SpO2: 98%  Weight: 158 lb 1.6 oz (71.7 kg)  Height: _0  (1.778 m)    General:  WDWN in NAD; vital signs documented above Gait: Male, unaided HENT: WNL, normocephalic Pulmonary: normal non-labored breathing , without Rales, rhonchi,  wheezing Cardiac: regular HR, without  Murmurs without carotid bruit Abdomen: soft, NT, no masses Skin: without rashes Vascular Exam/Pulses: + left popliteal and femoral pulses. Both feet are warm with intact motor function and sensation Extremities: with chronic ischemic changes, without Gangrene , without cellulitis; without  open wounds;  Musculoskeletal: no muscle wasting or atrophy  Neurologic: A&O X 3;  No focal weakness or paresthesias are detected Psychiatric:  The pt has Normal affect.   Non-Invasive Vascular Imaging:   Left: Total occlusion noted in the superficial femoral artery.   ASSESSMENT/PLAN:: 86 y.o. male here for follow up for left lower extremity claudication.  The patient is currently without increase in severity of his symptoms.  No tissue loss.  No rest pain.  Continue observation and return to the clinic in 6 weeks to reevaluate his left lower extremity symptoms and to see how he is doing from a cardiac standpoint.  Continue statin Plavix and Eliquis and I encouraged him to continue a walking program.  He states he is considering the [facility] cardiac rehab program.  I told him at any time he feels his pain is intolerable, develops rest pain or skin issues he should call our office.  Barbie Banner, PA-C [facility] [phone]  Clinic MD:   Carlis Abbott (on call)

## 2019-12-02 ENCOUNTER — Other Ambulatory Visit: Payer: Self-pay | Admitting: Family

## 2019-12-14 ENCOUNTER — Telehealth: Payer: Self-pay | Admitting: *Deleted

## 2019-12-14 MED ORDER — APIXABAN 5 MG PO TABS: 5.0000 mg | ORAL_TABLET | Freq: Two times a day (BID) | ORAL | 2 refills | Status: DC

## 2019-12-14 NOTE — Telephone Encounter (Signed)
Rx refill sent to pharmacy. 

## 2019-12-26 ENCOUNTER — Other Ambulatory Visit: Payer: Self-pay

## 2019-12-26 ENCOUNTER — Ambulatory Visit: Payer: Medicare HMO | Admitting: Physician Assistant

## 2019-12-26 VITALS — BP 157/69 | HR 57 | Temp 98.4°F | Resp 20 | Ht 70.0 in | Wt 160.3 lb

## 2019-12-26 DIAGNOSIS — I70219 Atherosclerosis of native arteries of extremities with intermittent claudication, unspecified extremity: Secondary | ICD-10-CM

## 2019-12-26 NOTE — Progress Notes (Signed)
Established Previous Bypass   History of Present Illness   Ian Cummings is a 86 y.o. (10-09-1928) male who presents with left lower extremity claudication.   Surgical history is significant for right carotid stent performed at [facility] in 2013.  He is also status post right iliofemoral endarterectomy with bovine patch and right femoral to below the knee popliteal artery bypass with PTFE on 08/03/2015 by Dr. Trula Slade due to heel and great toe ulcer which have since healed.  PAD of left lower extremity is now being evaluated.  He has a known left SFA occlusion.  Patient however denies any rest pain or nonhealing wounds.  Admittedly he is limited by pain in both hips which is symmetrical in severity.  He attributes this to osteoarthritis.    He is also followed by Dr. Bettina Gavia for PAF, CAD, diastolic HF, and mitral valve regurgitation.  He had a heart catheterization in June of this year.  CAD is being managed medically.  He will be enrolling in cardiac rehab but wanted to discuss LLE arterial disease prior to starting.  He is on Eliquis and plavix.  He denies tobacco use.  The patient's PMH, PSH, SH, and FamHx were reviewed and  are unchanged from prior visit.  Current Outpatient Medications  Medication Sig Dispense Refill  . ACCU-CHEK AVIVA PLUS test strip     . allopurinol (ZYLOPRIM) 100 MG tablet Take 100 mg by mouth daily.     Marland Kitchen amLODipine (NORVASC) 5 MG tablet Take 5 mg by mouth daily.     Marland Kitchen apixaban (ELIQUIS) 5 MG TABS tablet Take 1 tablet (5 mg total) by mouth 2 (two) times daily. 180 tablet 2  . Blood Glucose Monitoring Suppl (ACCU-CHEK AVIVA PLUS) w/Device KIT     . clopidogrel (PLAVIX) 75 MG tablet Take 75 mg by mouth daily.    Marland Kitchen escitalopram (LEXAPRO) 10 MG tablet Take 10 mg by mouth daily.    . furosemide (LASIX) 20 MG tablet Take 20 mg by mouth 2 (two) times a week.    . isosorbide mononitrate (IMDUR) 60 MG 24 hr tablet Take 1 tablet (60 mg total) by mouth daily. 90 tablet 1  .  lansoprazole (PREVACID) 30 MG capsule Take 30 mg by mouth daily.    Marland Kitchen levothyroxine (SYNTHROID, LEVOTHROID) 88 MCG tablet Take 88 mcg by mouth daily before breakfast.    . metoprolol succinate (TOPROL-XL) 50 MG 24 hr tablet Take 50 mg by mouth daily.    . multivitamin (RENA-VIT) TABS tablet Take 1 tablet by mouth daily.    . nitroGLYCERIN (NITROSTAT) 0.4 MG SL tablet Place 0.4 mg under the tongue every 5 (five) minutes as needed for chest pain.     . rosuvastatin (CRESTOR) 40 MG tablet Take 40 mg by mouth daily.     No current facility-administered medications for this visit.    REVIEW OF SYSTEMS (negative unless checked):   Cardiac:  []  Chest pain or chest pressure? []  Shortness of breath upon activity? []  Shortness of breath when lying flat? []  Irregular heart rhythm?  Vascular:  []  Pain in calf, thigh, or hip brought on by walking? []  Pain in feet at night that wakes you up from your sleep? []  Blood clot in your veins? []  Leg swelling?  Pulmonary:  []  Oxygen at home? []  Productive cough? []  Wheezing?  Neurologic:  []  Sudden weakness in arms or legs? []  Sudden numbness in arms or legs? []  Sudden onset of difficult speaking or  slurred speech? []  Temporary loss of vision in one eye? []  Problems with dizziness?  Gastrointestinal:  []  Blood in stool? []  Vomited blood?  Genitourinary:  []  Burning when urinating? []  Blood in urine?  Psychiatric:  []  Major depression  Hematologic:  []  Bleeding problems? []  Problems with blood clotting?  Dermatologic:  []  Rashes or ulcers?  Constitutional:  []  Fever or chills?  Ear/Nose/Throat:  []  Change in hearing? []  Nose bleeds? []  Sore throat?  Musculoskeletal:  []  Back pain? []  Joint pain? []  Muscle pain?   Physical Examination   Vitals:   12/26/19 0958  BP: (!) 157/69  Pulse: (!) 57  Resp: 20  Temp: 98.4 F (36.9 C)  TempSrc: Temporal  SpO2: 97%  Weight: 160 lb 4.8 oz (72.7 kg)  Height: 5' 10"  (1.778 m)    Body mass index is 23 kg/m.  General:  WDWN in NAD; vital signs documented above Gait: Not observed HENT: WNL, normocephalic Pulmonary: normal non-labored breathing , without Rales, rhonchi,  wheezing Cardiac: regular HR Abdomen: soft, NT, no masses Skin: without rashes Vascular Exam/Pulses:  Right Left  DP absent absent  PT absent absent   Extremities: without ischemic changes, without Gangrene , without cellulitis; without open wounds;  Musculoskeletal: no muscle wasting or atrophy  Neurologic: A&O X 3;  No focal weakness or paresthesias are detected Psychiatric:  The pt has Normal affect.    Medical Decision Making   Ian Cummings is a 86 y.o. male who presents for evaluation of LLE claudication   Known L SFA occlusion; claudication symptoms of LLE are stable. He is without rest pain or non healing wounds of LLE.  His limb is not threatened, thus would hold off on any revascularization at this time.  Patient is limited more due to arthritic pain of bilateral hips.  Recommended patient follow up Ortho to discuss options for pain relief.  Otherwise ok for cardiac rehab from our standpoint.  Plan will be to recheck carotid duplex, RLE arterial duplex, and ABIs around December of this year.  He will return to office sooner if rest pain or wounds develop.  The patient and his wife are agreeable to this plan   Ian Ligas PA-C [facility] [place] Office: [facility] MD: Trula Slade

## 2020-01-20 DIAGNOSIS — I209 Angina pectoris, unspecified: Secondary | ICD-10-CM | POA: Insufficient documentation

## 2020-01-20 DIAGNOSIS — K635 Polyp of colon: Secondary | ICD-10-CM | POA: Insufficient documentation

## 2020-01-20 DIAGNOSIS — I639 Cerebral infarction, unspecified: Secondary | ICD-10-CM | POA: Insufficient documentation

## 2020-01-20 DIAGNOSIS — I2510 Atherosclerotic heart disease of native coronary artery without angina pectoris: Secondary | ICD-10-CM | POA: Insufficient documentation

## 2020-01-20 DIAGNOSIS — F419 Anxiety disorder, unspecified: Secondary | ICD-10-CM | POA: Insufficient documentation

## 2020-01-20 DIAGNOSIS — M1990 Unspecified osteoarthritis, unspecified site: Secondary | ICD-10-CM | POA: Insufficient documentation

## 2020-01-20 DIAGNOSIS — R7303 Prediabetes: Secondary | ICD-10-CM | POA: Insufficient documentation

## 2020-01-20 DIAGNOSIS — K219 Gastro-esophageal reflux disease without esophagitis: Secondary | ICD-10-CM | POA: Insufficient documentation

## 2020-01-20 DIAGNOSIS — G459 Transient cerebral ischemic attack, unspecified: Secondary | ICD-10-CM | POA: Insufficient documentation

## 2020-01-20 DIAGNOSIS — I779 Disorder of arteries and arterioles, unspecified: Secondary | ICD-10-CM | POA: Insufficient documentation

## 2020-01-20 DIAGNOSIS — Z8719 Personal history of other diseases of the digestive system: Secondary | ICD-10-CM | POA: Insufficient documentation

## 2020-01-20 DIAGNOSIS — G629 Polyneuropathy, unspecified: Secondary | ICD-10-CM | POA: Insufficient documentation

## 2020-01-20 DIAGNOSIS — E785 Hyperlipidemia, unspecified: Secondary | ICD-10-CM | POA: Insufficient documentation

## 2020-01-20 DIAGNOSIS — F32A Depression, unspecified: Secondary | ICD-10-CM | POA: Insufficient documentation

## 2020-01-20 DIAGNOSIS — I714 Abdominal aortic aneurysm, without rupture, unspecified: Secondary | ICD-10-CM | POA: Insufficient documentation

## 2020-01-20 DIAGNOSIS — I739 Peripheral vascular disease, unspecified: Secondary | ICD-10-CM | POA: Insufficient documentation

## 2020-01-22 NOTE — Progress Notes (Signed)
Cardiology Office Note:    Date:  01/23/2020   ID:  Ian Cummings, DOB [birth year], MRN [id number]  PCP:  Marton Redwood, MD  Cardiologist:  Shirlee More, MD    Referring MD: Marton Redwood, MD    ASSESSMENT:    1. Coronary artery disease of native artery of native heart with stable angina pectoris ([place])   2. PAF (paroxysmal atrial fibrillation) ([place])   3. Chronic anticoagulation   4. Hypertensive heart disease with chronic diastolic congestive heart failure ([place])   5. Nonrheumatic mitral valve regurgitation   6. Mixed hyperlipidemia   7. PAD (peripheral artery disease) (H[facility])    PLAN:    In order of problems listed above:  1. He is not doing well and is a combination of events including cardiac with his CAD and graft occlusion and an element of heart failure though is not fluid overloaded his peripheral vascular disease.  I will ask interventional cardiology to review his films again to see if PCI could improve the quality of his life.  We will continue his intense medical therapy including combined antiplatelet anticoagulant beta-blocker calcium channel blocker oral nitrate and high intensity statin.  He was intolerant of ranolazine with dizziness 2. Maintaining sinus rhythm continue anticoagulant beta-blocker 3. Compensated check proBNP level continue his loop diuretic 4. Stable mitral regurgitation not severe 5. Check lipids continue high intensity statin 6. Has been seen by vascular surgery may require revascularization.  He did not sustain myocardial infarction in June   Next appointment: 3 months   Medication Adjustments/Labs and Tests Ordered: Current medicines are reviewed at length with the patient today.  Concerns regarding medicines are outlined above.  Orders Placed This Encounter  Procedures  . Comprehensive metabolic panel  . Lipid panel  . Pro b natriuretic peptide (BNP)   No orders of the defined types were placed in this encounter.   Chief Complaint    Patient presents with  . Follow-up  . Coronary Artery Disease    History of Present Illness:    Ian Cummings is a 86 y.o. male with a hx of CAD status post CABG, hyperlipidemia hypertension peripheral arterial disease with stenting of the right internal carotid artery and revascularization of the right lower extremity, moderate mitral regurgitation, mild aortic stenosis last seen 11/09/2019.  He has had recent hospitalization with unstable angina pectoris heart failure and paroxysmal atrial fibrillation.  He underwent coronary angiography 10/25/2019.  The native right coronary artery was occluded and the vein graft to the right coronary artery was occluded.  The native left circumflex coronary artery was occluded and sequential vein graft to M2 and M3 was occluded in its distal portion.  Proximal LAD with stenosis 75%, left thoracic artery to the left anterior descending was patent with 50% distal LAD stenosis.  Ejection fraction estimated 45 to 50% and there was no LV outflow tract gradient.  In summary the culprit was felt to be the thrombotic occlusion of the vein graft to the right sided PDA medical therapy was advised.  With ongoing symptoms medications were adjusted last visit and referred to cardiac rehabilitation.  Is maintained on anticoagulant therapy with Eliquis and clopidogrel.  Compliance with diet, lifestyle and medications: Yes  He does not feel well he cannot ambulate very well because a hip pain lower extremity claudication but also short of breath walking a block.  He does not exercise is not the man he is to be although he has no edema orthopnea palpitation chest  pain.  He has had some skin bruising and some bleeding and area of excoriation on his left ankle.  He also is becoming somewhat despondent. Past Medical History:  Diagnosis Date  . AAA (abdominal aortic aneurysm) ([place])    2004, Pt stated not there now  . Anginal pain ([place])   . Anxiety   . Arthritis   .  Atherosclerosis of native arteries of the extremities with intermittent claudication 12/04/2011   ABI .54 and .54 in 2012   . Atherosclerotic PVD with intermittent claudication ([place]) 02/20/2014  . BPH (benign prostatic hypertrophy)   . Bradycardia 01/12/2019  . Bradycardia on ECG 11/11/2017  . CAD (coronary artery disease)   . Carotid artery disease ([place])    Right carotid stent 2013  . Carotid artery disease without cerebral infarction [facility]    S/p right carotid stent for asymptomatic disease  2013   . Chronic diastolic (congestive) heart failure ([place]) 09/10/2019  . Chronic kidney disease, stage 3a 09/10/2019  . Claudication ([place])   . Colon polyps    adenomatous  . Complex sleep apnea syndrome 11/11/2017  . Conjunctival lesion 05/04/2014   Formatting of this note might be different from the original. left  . Coronary artery disease   . Coronary artery disease involving native coronary artery of native heart with unstable angina pectoris ([place]) 09/06/2012   CABG w LIMA to LAD, SVG to OM1-OM2, SVG to dx, SVG to PD/PL 1995,  Cypher stent x 2 SVG to RCA and Cypher stent to Circ Graft 05/13/07, stent of SVG to circ OM in [place] in July 2013  Cath 4/14 [facility] normal Left main, occluded mid LAD, occluded RCA SVG, occluded CFX, widely patent OM 1 SVG, occluded RCA SVG, occluded Diag 1 SVG, widely patent LAD LIMA graft;  Redo CABG Dr. Servando Snare  10/18/12 with   . Depression   . Essential hypertension 01/12/2019  . GERD (gastroesophageal reflux disease)   . Gout   . H/O hiatal hernia   . Hyperlipidemia   . Hypertension   . Hypertension, renal disease, stage 1-4 or unspecified chronic kidney disease   . Hypothyroidism   . MI (myocardial infarction) ([place]) 4/14  . Mixed hyperlipidemia   . Neuropathy    Hx: of in B/L toes  . OSA on CPAP 02/17/2018  . PAD (peripheral artery disease) ([place]) 08/03/2015  . PAF (paroxysmal atrial fibrillation) ([place])   . Peripheral vascular disease ([place])    with claudication  .  Pre-diabetes   . RBBB 01/12/2019  . s/p CABG   . Shoulder pain 01/27/2019  . Stroke ([place])   . TIA (transient ischemic attack)    Hx: of  . Unstable angina ([place]) 09/10/2019    Past Surgical History:  Procedure Laterality Date  . CAROTID STENT INSERTION Right 2013   right at [facility]  . CATARACT EXTRACTION Bilateral    Hx: of both eyes  . COLONOSCOPY     Hx: of  . COLONOSCOPY    . CORONARY ANGIOPLASTY WITH STENT PLACEMENT  2010  . CORONARY ARTERY BYPASS GRAFT  1995  . CORONARY ARTERY BYPASS GRAFT N/A 10/18/2012   Procedure: REDO CORONARY ARTERY BYPASS GRAFTING (CABG);  Surgeon: Grace Isaac, MD;  Location: [place];  Service: Open Heart Surgery;  Laterality: N/A;  off pump times two using endoscopically harvested left saphenous vein  . CORONARY STENT PLACEMENT  06/2016  . ENDARTERECTOMY FEMORAL Right 08/03/2015   Procedure: RIGHT FEMORAL ENDARTERECTOMY WITH PATCH ANGIOPLASTY;  Surgeon:  Serafina Mitchell, MD;  [facility];  Service: Vascular;  Laterality: Right;  . FEMORAL-POPLITEAL BYPASS GRAFT Right 08/03/2015   Procedure: RIGHT FEMORAL-BELOW KNEE POPLITEAL ARTERY BYPASS GRAFT;  Surgeon: Serafina Mitchell, MD;  Location: [place];  Service: Vascular;  Laterality: Right;  . FOOT SURGERY Left   . FRACTURE SURGERY Left    Hx: of Left heel  . INGUINAL HERNIA REPAIR Bilateral    X 2   . INTRAOPERATIVE TRANSESOPHAGEAL ECHOCARDIOGRAM N/A 10/18/2012   Procedure: INTRAOPERATIVE TRANSESOPHAGEAL ECHOCARDIOGRAM;  Surgeon: Grace Isaac, MD;  Location: [place];  Service: Open Heart Surgery;  Laterality: N/A;  . LEFT HEART CATH AND CORS/GRAFTS ANGIOGRAPHY N/A 10/25/2019   Procedure: LEFT HEART CATH AND CORS/GRAFTS ANGIOGRAPHY;  Surgeon: Leonie Man, MD;  Location: [place] CV LAB;  Service: Cardiovascular;  Laterality: N/A;  . LEFT HEART CATHETERIZATION WITH CORONARY/GRAFT ANGIOGRAM N/A 02/02/2013   Procedure: LEFT HEART CATHETERIZATION WITH Beatrix Fetters;  Surgeon: Jacolyn Reedy, MD;  Location: [facility] CATH LAB;  Service: Cardiovascular;  Laterality: N/A;  . LOWER EXTREMITY ANGIOGRAM Right 06/27/2015   Procedure: Lower Extremity Angiogram;  Surgeon: Serafina Mitchell, MD;  Location: [place] CV LAB;  Service: Cardiovascular;  Laterality: Right;  . MASTOIDECTOMY  1933  . PERIPHERAL VASCULAR CATHETERIZATION N/A 01/23/2015   Procedure: Abdominal Aortogram;  Surgeon: Serafina Mitchell, MD;  Location: [place] CV LAB;  Service: Cardiovascular;  Laterality: N/A;  . PERIPHERAL VASCULAR CATHETERIZATION N/A 06/27/2015   Procedure: Abdominal Aortogram;  Surgeon: Serafina Mitchell, MD;  Location: [place] CV LAB;  Service: Cardiovascular;  Laterality: N/A;  . RETINAL DETACHMENT SURGERY Left    Hx: of left eye  . TONSILLECTOMY      Current Medications: Current Meds  Medication Sig  . ACCU-CHEK AVIVA PLUS test strip   . allopurinol (ZYLOPRIM) 100 MG tablet Take 100 mg by mouth daily.   Marland Kitchen amLODipine (NORVASC) 5 MG tablet Take 5 mg by mouth daily.   Marland Kitchen apixaban (ELIQUIS) 5 MG TABS tablet Take 1 tablet (5 mg total) by mouth 2 (two) times daily.  . Blood Glucose Monitoring Suppl (ACCU-CHEK AVIVA PLUS) w/Device KIT   . clopidogrel (PLAVIX) 75 MG tablet Take 75 mg by mouth daily.  Marland Kitchen escitalopram (LEXAPRO) 10 MG tablet Take 10 mg by mouth daily.  . furosemide (LASIX) 20 MG tablet Take 20 mg by mouth 2 (two) times a week.  . isosorbide mononitrate (IMDUR) 60 MG 24 hr tablet Take 1 tablet (60 mg total) by mouth daily.  . lansoprazole (PREVACID) 30 MG capsule Take 30 mg by mouth daily.  Marland Kitchen levothyroxine (SYNTHROID, LEVOTHROID) 88 MCG tablet Take 88 mcg by mouth daily before breakfast.  . metoprolol succinate (TOPROL-XL) 50 MG 24 hr tablet Take 50 mg by mouth daily.  . multivitamin (RENA-VIT) TABS tablet Take 1 tablet by mouth daily.  . nitroGLYCERIN (NITROSTAT) 0.4 MG SL tablet Place 0.4 mg under the tongue every 5 (five) minutes as needed for chest pain.   . rosuvastatin (CRESTOR) 40  MG tablet Take 40 mg by mouth daily.     Allergies:   Amoxicillin and Metoprolol   Social History   Socioeconomic History  . Marital status: Married    Spouse name: Not on file  . Number of children: 3  . Years of education: Not on file  . Highest education level: Not on file  Occupational History  . Occupation: retired  Tobacco Use  . Smoking status: Former Smoker  Types: Cigarettes    Quit date: 04/29/1975    Years since quitting: 44.7  . Smokeless tobacco: Never Used  Vaping Use  . Vaping Use: Never used  Substance and Sexual Activity  . Alcohol use: Yes    Alcohol/week: 3.0 standard drinks    Types: 3 Glasses of wine per week    Comment: 3 glasses of wine today  . Drug use: No  . Sexual activity: Not Currently  Other Topics Concern  . 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 Difficulty of Paying Living Expenses: Not on file  Food Insecurity:   . Worried About Charity fundraiser in the Last Year: Not on file  . Ran Out of Food in the Last Year: Not on file  Transportation Needs:   . Lack of Transportation (Medical): Not on file  . Lack of Transportation (Non-Medical): Not on file  Physical Activity:   . Days of Exercise per Week: Not on file  . Minutes of Exercise per Session: Not on file  Stress:   . Feeling of Stress : Not on file  Social Connections:   . Frequency of Communication with Friends and Family: Not on file  . Frequency of Social Gatherings with Friends and Family: Not on file  . Attends Religious Services: Not on file  . Active Member of Clubs or Organizations: Not on file  . Attends Archivist Meetings: Not on file  . Marital Status: Not on file     Family History: The patient's family history includes Diabetes in his father; Heart attack in his mother; Heart disease in his father and mother; Hyperlipidemia in his father; Hypertension  in his father; Stroke in his sister. There is no history of Colon cancer, Stomach cancer, Rectal cancer, Esophageal cancer, Liver disease, or Kidney disease. ROS:   Please see the history of present illness.    All other systems reviewed and are negative.  EKGs/Labs/Other Studies Reviewed:    The following studies were reviewed today:   Recent Labs: 09/10/2019: B Natriuretic Peptide 284.4 09/12/2019: Magnesium 1.9 10/12/2019: ALT 21 10/19/2019: BUN 29; Creatinine, Ser 1.14; Hemoglobin 12.9; Platelets 185; Potassium 4.7; Sodium 138  Recent Lipid Panel    Component Value Date/Time   CHOL  04/18/2009 0241    109        ATP III CLASSIFICATION:  <200     mg/dL   Desirable  200-239  mg/dL   Borderline High  >=240    mg/dL   High          TRIG 114 04/18/2009 0241   HDL 34 (L) 04/18/2009 0241   CHOLHDL 3.2 04/18/2009 0241   VLDL 23 04/18/2009 0241   LDLCALC  04/18/2009 0241    52        Total Cholesterol/HDL:CHD Risk Coronary Heart Disease Risk Table                     Men   Women  1/2 Average Risk   3.4   3.3  Average Risk       5.0   4.4  2 X Average Risk   9.6   7.1  3 X Average Risk  23.4   11.0        Use the calculated Patient Ratio above and the CHD Risk Table to determine the patient's CHD Risk.  ATP III CLASSIFICATION (LDL):  <100     mg/dL   Optimal  100-129  mg/dL   Near or Above                    Optimal  130-159  mg/dL   Borderline  160-189  mg/dL   High  >190     mg/dL   Very High    Physical Exam:    VS:  BP (!) 148/82   Pulse 60   Ht 5' 11"  (1.803 m)   Wt 158 lb (71.7 kg)   SpO2 96%   BMI 22.04 kg/m     Wt Readings from Last 3 Encounters:  01/23/20 158 lb (71.7 kg)  12/26/19 160 lb 4.8 oz (72.7 kg)  11/14/19 158 lb 1.6 oz (71.7 kg)     GEN:  Well nourished, well developed in no acute distress HEENT: Normal NECK: No JVD; No carotid bruits LYMPHATICS: No lymphadenopathy CARDIAC: 1-2 of 6 MR RRR, no murmurs, rubs, gallops RESPIRATORY:   Clear to auscultation without rales, wheezing or rhonchi  ABDOMEN: Soft, non-tender, non-distended MUSCULOSKELETAL:  No edema; No deformity  SKIN: Warm and dry NEUROLOGIC:  Alert and oriented x 3 PSYCHIATRIC:  Normal affect    Signed, Shirlee More, MD  01/23/2020 10:35 AM    [place]

## 2020-01-23 ENCOUNTER — Ambulatory Visit: Payer: Medicare HMO | Admitting: Cardiology

## 2020-01-23 ENCOUNTER — Other Ambulatory Visit: Payer: Self-pay

## 2020-01-23 ENCOUNTER — Encounter: Payer: Self-pay | Admitting: Cardiology

## 2020-01-23 VITALS — BP 148/82 | HR 60 | Ht 71.0 in | Wt 158.0 lb

## 2020-01-23 DIAGNOSIS — I480 Paroxysmal atrial fibrillation: Secondary | ICD-10-CM

## 2020-01-23 DIAGNOSIS — I739 Peripheral vascular disease, unspecified: Secondary | ICD-10-CM

## 2020-01-23 DIAGNOSIS — I340 Nonrheumatic mitral (valve) insufficiency: Secondary | ICD-10-CM

## 2020-01-23 DIAGNOSIS — I5032 Chronic diastolic (congestive) heart failure: Secondary | ICD-10-CM

## 2020-01-23 DIAGNOSIS — E782 Mixed hyperlipidemia: Secondary | ICD-10-CM | POA: Diagnosis not present

## 2020-01-23 DIAGNOSIS — I110 Hypertensive heart disease with heart failure: Secondary | ICD-10-CM

## 2020-01-23 DIAGNOSIS — I25118 Atherosclerotic heart disease of native coronary artery with other forms of angina pectoris: Secondary | ICD-10-CM

## 2020-01-23 DIAGNOSIS — Z7901 Long term (current) use of anticoagulants: Secondary | ICD-10-CM | POA: Diagnosis not present

## 2020-01-23 NOTE — Patient Instructions (Signed)
Medication Instructions:  Your physician recommends that you continue on your current medications as directed. Please refer to the Current Medication list given to you today.  *If you need a refill on your cardiac medications before your next appointment, please call your pharmacy*   Lab Work: Your physician recommends that you return for lab work in: TODAY CMP, Lipids, ProBNP If you have labs (blood work) drawn today and your tests are completely normal, you will receive your results only by: Marland Kitchen MyChart Message (if you have MyChart) OR . A paper copy in the mail If you have any lab test that is abnormal or we need to change your treatment, we will call you to review the results.   Testing/Procedures: None   Follow-Up: At [facility], you and your health needs are our priority.  As part of our continuing mission to provide you with exceptional heart care, we have created designated Provider Care Teams.  These Care Teams include your primary Cardiologist (physician) and Advanced Practice Providers (APPs -  Physician Assistants and Nurse Practitioners) who all work together to provide you with the care you need, when you need it.  We recommend signing up for the patient portal called "MyChart".  Sign up information is provided on this After Visit Summary.  MyChart is used to connect with patients for Virtual Visits (Telemedicine).  Patients are able to view lab/test results, encounter notes, upcoming appointments, etc.  Non-urgent messages can be sent to your provider as well.   To learn more about what you can do with MyChart, go to [email or web address].    Your next appointment:   3 month(s)  The format for your next appointment:   In Person  Provider:   Shirlee More, MD   Other Instructions

## 2020-01-24 LAB — COMPREHENSIVE METABOLIC PANEL WITH GFR
ALT: 21 [IU]/L (ref 0–44)
AST: 25 [IU]/L (ref 0–40)
Albumin/Globulin Ratio: 1.1 — ABNORMAL LOW (ref 1.2–2.2)
Albumin: 3.9 g/dL (ref 3.6–4.6)
Alkaline Phosphatase: 54 [IU]/L (ref 44–121)
BUN/Creatinine Ratio: 19 (ref 10–24)
BUN: 20 mg/dL (ref 8–27)
Bilirubin Total: 0.5 mg/dL (ref 0.0–1.2)
CO2: 21 mmol/L (ref 20–29)
Calcium: 9.4 mg/dL (ref 8.6–10.2)
Chloride: 103 mmol/L (ref 96–106)
Creatinine, Ser: 1.05 mg/dL (ref 0.76–1.27)
GFR calc Af Amer: 73 mL/min/{1.73_m2}
GFR calc non Af Amer: 63 mL/min/{1.73_m2}
Globulin, Total: 3.5 g/dL (ref 1.5–4.5)
Glucose: 125 mg/dL — ABNORMAL HIGH (ref 65–99)
Potassium: 4 mmol/L (ref 3.5–5.2)
Sodium: 138 mmol/L (ref 134–144)
Total Protein: 7.4 g/dL (ref 6.0–8.5)

## 2020-01-24 LAB — LIPID PANEL
Chol/HDL Ratio: 4 ratio (ref 0.0–5.0)
Cholesterol, Total: 121 mg/dL (ref 100–199)
HDL: 30 mg/dL — ABNORMAL LOW (ref >39)
LDL Chol Calc (NIH): 59 mg/dL (ref 0–99)
Triglycerides: 191 mg/dL — ABNORMAL HIGH (ref 0–149)
VLDL Cholesterol Cal: 32 mg/dL (ref 5–40)

## 2020-01-24 LAB — PRO B NATRIURETIC PEPTIDE: NT-Pro BNP: 1654 pg/mL — ABNORMAL HIGH (ref 0–486)

## 2020-01-30 DIAGNOSIS — M7061 Trochanteric bursitis, right hip: Secondary | ICD-10-CM | POA: Diagnosis not present

## 2020-01-30 DIAGNOSIS — M542 Cervicalgia: Secondary | ICD-10-CM | POA: Diagnosis not present

## 2020-01-30 DIAGNOSIS — M7542 Impingement syndrome of left shoulder: Secondary | ICD-10-CM | POA: Diagnosis not present

## 2020-01-31 DIAGNOSIS — M542 Cervicalgia: Secondary | ICD-10-CM | POA: Diagnosis not present

## 2020-02-01 DIAGNOSIS — M542 Cervicalgia: Secondary | ICD-10-CM | POA: Insufficient documentation

## 2020-02-06 DIAGNOSIS — M542 Cervicalgia: Secondary | ICD-10-CM | POA: Diagnosis not present

## 2020-02-06 DIAGNOSIS — M5030 Other cervical disc degeneration, unspecified cervical region: Secondary | ICD-10-CM | POA: Diagnosis not present

## 2020-02-13 DIAGNOSIS — K219 Gastro-esophageal reflux disease without esophagitis: Secondary | ICD-10-CM | POA: Diagnosis not present

## 2020-02-13 DIAGNOSIS — R079 Chest pain, unspecified: Secondary | ICD-10-CM | POA: Diagnosis not present

## 2020-02-13 DIAGNOSIS — Z955 Presence of coronary angioplasty implant and graft: Secondary | ICD-10-CM | POA: Diagnosis not present

## 2020-02-13 DIAGNOSIS — N289 Disorder of kidney and ureter, unspecified: Secondary | ICD-10-CM | POA: Diagnosis not present

## 2020-02-13 DIAGNOSIS — E785 Hyperlipidemia, unspecified: Secondary | ICD-10-CM | POA: Diagnosis not present

## 2020-02-13 DIAGNOSIS — I4891 Unspecified atrial fibrillation: Secondary | ICD-10-CM | POA: Diagnosis not present

## 2020-02-13 DIAGNOSIS — N2889 Other specified disorders of kidney and ureter: Secondary | ICD-10-CM | POA: Diagnosis not present

## 2020-02-13 DIAGNOSIS — Z951 Presence of aortocoronary bypass graft: Secondary | ICD-10-CM | POA: Diagnosis not present

## 2020-02-13 DIAGNOSIS — R918 Other nonspecific abnormal finding of lung field: Secondary | ICD-10-CM | POA: Diagnosis not present

## 2020-02-15 ENCOUNTER — Telehealth: Payer: Self-pay | Admitting: Cardiology

## 2020-02-15 NOTE — Telephone Encounter (Signed)
Appointment made with Dr. Bettina Gavia 02[facility]. Pt was released from [facility] 02/14/20.

## 2020-02-15 NOTE — Telephone Encounter (Signed)
Spoke to the patient just now and he let me know that he had just forgotten that he spoke with [place] earlier and gotten an appointment for Friday. I went over the appointment details with him again and he verbalizes understanding. He states that he is feeling a lot better.    Encouraged patient to call back with any questions or concerns.

## 2020-02-15 NOTE — Telephone Encounter (Signed)
UPDATE, PATIENT CALLED BACK WITH BP AND HR READINGS   Patient c/o Palpitations:  High priority if patient c/o lightheadedness, shortness of breath, or chest pain  1. How long have you had palpitations/irregular HR/ Afib? Are you having the symptoms now? afib - patient is not currently in afib  2. Are you currently experiencing lightheadedness, SOB or CP? No   3. Do you have a history of afib (atrial fibrillation) or irregular heart rhythm? Yes   4. Have you checked your BP or HR? (document readings if available):   BP: 154/73  HR:  96  5. Are you experiencing any other symptoms?   No pain, but chest "feels funny, a little tight"

## 2020-02-15 NOTE — Telephone Encounter (Signed)
Patient c/o Palpitations:  High priority if patient c/o lightheadedness, shortness of breath, or chest pain  1) How long have you had palpitations/irregular HR/ Afib? Are you having the symptoms now? afib - patient is not currently in afib  2) Are you currently experiencing lightheadedness, SOB or CP? No   3) Do you have a history of afib (atrial fibrillation) or irregular heart rhythm? Yes   4) Have you checked your BP or HR? (document readings if available): No readings available.  5) Are you experiencing any other symptoms? No

## 2020-02-15 NOTE — Telephone Encounter (Signed)
Patient following up.

## 2020-02-16 NOTE — Progress Notes (Signed)
Cardiology Office Note:    Date:  02/17/2020   ID:  Ian Cummings, DOB 15-Jan-1929, MRN [id number]  PCP:  Marton Redwood, MD  Cardiologist:  Shirlee More, MD    Referring MD: Marton Redwood, MD    ASSESSMENT:    1. PAF (paroxysmal atrial fibrillation) ([place])   2. Chronic anticoagulation   3. Hypertensive heart disease with chronic diastolic congestive heart failure ([place])   4. Nonrheumatic mitral valve regurgitation   5. Coronary artery disease of native artery of native heart with stable angina pectoris ([place])    PLAN:    In order of problems listed above:  1. He has had a marked decompensation coincident with recurrent atrial fibrillation.  Unfortunately he had stopped his anticoagulant for 6 days he has been back on it since Monday he is adamant that much of his illness is Eliquis and agrees to transition without a gap between now with some Xarelto and what I had like to do is set him up for TEE guided cardioversion of the first opportunity next week.  If successful will need an antiarrhythmic drug with his heart failure and I will initiate him on amiodarone 400 mg twice daily 2. Continue anticoagulant without gap transition Eliquis to Xarelto 20 mg daily 3. He has symptoms of heart failure check proBNP and take a dose of diuretic when he returns home and take it daily he has no peripheral edema. 4. Reassess by TEE, I think this is predominantly functional and decompensates in the setting of atrial fibrillation if severe we could consider mitral clip for palliation 5. Stable CAD recent cardiac catheterization report noted I spoken to both Dr. Ellyn Hack and Burt Knack he did not think he is a candidate for percutaneous intervention 6. Renal mass and strongly encouraged him to follow through have his MRI done and will send a copy of the note to his urologist Dr. Abner Greenspan   Next appointment: 2 weeks in the office if he deteriorates I told the wife to bring him to the emergency room recurrent atrial  fibrillation  Medication Adjustments/Labs and Tests Ordered: Current medicines are reviewed at length with the patient today.  Concerns regarding medicines are outlined above.  Orders Placed This Encounter  Procedures  . CBC  . Basic metabolic panel  . Pro b natriuretic peptide (BNP)  . INR/PT  . EKG 12-Lead   No orders of the defined types were placed in this encounter.   No chief complaint on file.   History of Present Illness:    Ian Cummings is a 86 y.o. male with a hx of CAD previous CABG hyperlipidemia hypertension peripheral arterial disease moderate aortic regurgitation mild aortic stenosis and hospitalization this year with unstable angina pectoris heart failure and paroxysmal atrial fibrillation.  He was last seen 01/23/2020.He underwent coronary angiography 10/25/2019.  The native right coronary artery was occluded and the vein graft to the right coronary artery was occluded.  The native left circumflex coronary artery was occluded and sequential vein graft to M2 and M3 was occluded in its distal portion.  Proximal LAD with stenosis 75%, left thoracic artery to the left anterior descending was patent with 50% distal LAD stenosis.  Ejection fraction estimated 45 to 50% and there was no LV outflow tract gradient.  In summary the culprit was felt to be the thrombotic occlusion of the vein graft to the right sided PDA medical therapy was advised.  With ongoing symptoms medications were adjusted last visit and referred to cardiac  rehabilitation.    He has been maintained on anticoagulant therapy with Eliquis and clopidogrel.   Compliance with diet, lifestyle and medications: Yes  Is a very very complicated visit he was seen at [facility] ED on Monday and I cannot access medical records. He and his wife say he was there with atrial fibrillation.  While in the ED he had lab work performed and was told he had a renal mass. He has been seen by urology I strongly encouraged him to  accept intervention if he has confined renal cancer.  Unfortunately stopped his anticoagulant for 6 days prior to ED visit and said he felt wonderful.  He has been back on his anticoagulant since that time. He is very despondent discouraged feels tired has lost his appetite and has lost significant weight and feels very weak.  He also finds himself short of breath at rest and activities however he does not have any orthopnea or peripheral edema.  Is been taking his diuretic 4 days a week.  At the end of his visit records become available in the ED hemoglobin was 14 9 platelets 149,000 white count was 14,100 creatinine 1.07 potassium 4.2 sodium normal 135.  He had a troponin drawn that was low level 0.45.  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  I independently reviewed that EKG shows atrial fibrillation controlled rate Past Medical History:  Diagnosis Date  . AAA (abdominal aortic aneurysm) ([place])    2004, Pt stated not there now  . Anginal pain ([place])   . Anxiety   . Arthritis   . Atherosclerosis of native arteries of the extremities with intermittent claudication 12/04/2011   ABI .15 and .54 in 2012   . Atherosclerotic PVD with intermittent claudication ([place]) 02/20/2014  . BPH (benign prostatic hypertrophy)   . Bradycardia 01/12/2019  . Bradycardia on ECG 11/11/2017  . CAD (coronary artery disease)   . Carotid artery disease ([place])    Right carotid stent 2013  . Carotid artery disease without cerebral infarction [facility])    S/p right carotid stent for asymptomatic disease  2013   . Chronic diastolic (congestive) heart failure ([place]) 09/10/2019  . Chronic kidney disease, stage 3a ([place]) 09/10/2019  . Claudication ([place])   . Colon polyps    adenomatous  . Complex sleep apnea syndrome 11/11/2017  . Conjunctival lesion 05/04/2014   Formatting of this note might be  different from the original. left  . Coronary artery disease   . Coronary artery disease involving native coronary artery of native heart with unstable angina pectoris ([place]) 09/06/2012   CABG w LIMA to LAD, SVG to OM1-OM2, SVG to dx, SVG to PD/PL 1995,  Cypher stent x 2 SVG to RCA and Cypher stent to Circ Graft 05/13/07, stent of SVG to circ OM in [place] in July 2013  Cath 4/14 [facility] normal Left main, occluded mid LAD, occluded RCA SVG, occluded CFX, widely patent OM 1 SVG, occluded RCA SVG, occluded Diag 1 SVG, widely patent LAD LIMA graft;  Redo CABG Dr. Servando Snare  10/18/12 with   . Depression   . Essential hypertension 01/12/2019  . GERD (gastroesophageal reflux disease)   . Gout   . H/O hiatal hernia   . Hyperlipidemia   . Hypertension   . Hypertension, renal disease, stage 1-4 or unspecified chronic kidney disease   . Hypothyroidism   .  Malnutrition of moderate degree 08/05/2015  . MI (myocardial infarction) ([place]) 4/14  . Mixed hyperlipidemia   . Neuropathy    Hx: of in B/L toes  . OSA on CPAP 02/17/2018  . PAD (peripheral artery disease) ([place]) 08/03/2015  . PAF (paroxysmal atrial fibrillation) ([place])   . Peripheral vascular disease ([place])    with claudication  . Pre-diabetes   . RBBB 01/12/2019  . s/p CABG   . Shoulder pain 01/27/2019  . Stroke ([place])   . TIA (transient ischemic attack)    Hx: of  . Unstable angina ([place]) 09/10/2019    Past Surgical History:  Procedure Laterality Date  . CAROTID STENT INSERTION Right 2013   right at [facility]  . CATARACT EXTRACTION Bilateral    Hx: of both eyes  . COLONOSCOPY     Hx: of  . COLONOSCOPY    . CORONARY ANGIOPLASTY WITH STENT PLACEMENT  2010  . CORONARY ARTERY BYPASS GRAFT  1995  . CORONARY ARTERY BYPASS GRAFT N/A 10/18/2012   Procedure: REDO CORONARY ARTERY BYPASS GRAFTING (CABG);  Surgeon: Grace Isaac, MD;  Location: [facility];  Service: Open Heart Surgery;  Laterality: N/A;  off pump times two using endoscopically harvested  left saphenous vein  . CORONARY STENT PLACEMENT  06/2016  . ENDARTERECTOMY FEMORAL Right 08/03/2015   Procedure: RIGHT FEMORAL ENDARTERECTOMY WITH PATCH ANGIOPLASTY;  Surgeon: Serafina Mitchell, MD;  Location: [facility];  Service: Vascular;  Laterality: Right;  . FEMORAL-POPLITEAL BYPASS GRAFT Right 08/03/2015   Procedure: RIGHT FEMORAL-BELOW KNEE POPLITEAL ARTERY BYPASS GRAFT;  Surgeon: Serafina Mitchell, MD;  Location: [facility];  Service: Vascular;  Laterality: Right;  . FOOT SURGERY Left   . FRACTURE SURGERY Left    Hx: of Left heel  . INGUINAL HERNIA REPAIR Bilateral    X 2   . INTRAOPERATIVE TRANSESOPHAGEAL ECHOCARDIOGRAM N/A 10/18/2012   Procedure: INTRAOPERATIVE TRANSESOPHAGEAL ECHOCARDIOGRAM;  Surgeon: Grace Isaac, MD;  Location: [facility];  Service: Open Heart Surgery;  Laterality: N/A;  . LEFT HEART CATH AND CORS/GRAFTS ANGIOGRAPHY N/A 10/25/2019   Procedure: LEFT HEART CATH AND CORS/GRAFTS ANGIOGRAPHY;  Surgeon: Leonie Man, MD;  Location: [place] CV LAB;  Service: Cardiovascular;  Laterality: N/A;  . LEFT HEART CATHETERIZATION WITH CORONARY/GRAFT ANGIOGRAM N/A 02/02/2013   Procedure: LEFT HEART CATHETERIZATION WITH Beatrix Fetters;  Surgeon: Jacolyn Reedy, MD;  Location: [facility] CATH LAB;  Service: Cardiovascular;  Laterality: N/A;  . LOWER EXTREMITY ANGIOGRAM Right 06/27/2015   Procedure: Lower Extremity Angiogram;  Surgeon: Serafina Mitchell, MD;  Location: [place] CV LAB;  Service: Cardiovascular;  Laterality: Right;  . MASTOIDECTOMY  1933  . PERIPHERAL VASCULAR CATHETERIZATION N/A 01/23/2015   Procedure: Abdominal Aortogram;  Surgeon: Serafina Mitchell, MD;  Location: [place] CV LAB;  Service: Cardiovascular;  Laterality: N/A;  . PERIPHERAL VASCULAR CATHETERIZATION N/A 06/27/2015   Procedure: Abdominal Aortogram;  Surgeon: Serafina Mitchell, MD;  Location: [place] CV LAB;  Service: Cardiovascular;  Laterality: N/A;  . RETINAL DETACHMENT SURGERY Left    Hx: of left eye  .  TONSILLECTOMY      Current Medications: Current Meds  Medication Sig  . ACCU-CHEK AVIVA PLUS test strip   . allopurinol (ZYLOPRIM) 100 MG tablet Take 100 mg by mouth daily.   Marland Kitchen amLODipine (NORVASC) 5 MG tablet Take 5 mg by mouth daily.   Marland Kitchen apixaban (ELIQUIS) 5 MG TABS tablet Take 1 tablet (5 mg total) by mouth 2 (two) times daily.  Marland Kitchen  Blood Glucose Monitoring Suppl (ACCU-CHEK AVIVA PLUS) w/Device KIT   . clopidogrel (PLAVIX) 75 MG tablet Take 75 mg by mouth daily.  Marland Kitchen escitalopram (LEXAPRO) 10 MG tablet Take 10 mg by mouth daily.  . furosemide (LASIX) 20 MG tablet Take 20 mg by mouth 4 (four) times a week.   Marland Kitchen HYDROcodone-acetaminophen (NORCO/VICODIN) 5-325 MG tablet Take 1 tablet by mouth every 4 (four) hours as needed.  . isosorbide mononitrate (IMDUR) 60 MG 24 hr tablet Take 1 tablet (60 mg total) by mouth daily.  . lansoprazole (PREVACID) 30 MG capsule Take 30 mg by mouth daily.  Marland Kitchen levothyroxine (SYNTHROID, LEVOTHROID) 88 MCG tablet Take 88 mcg by mouth daily before breakfast.  . metoprolol succinate (TOPROL-XL) 50 MG 24 hr tablet Take 50 mg by mouth daily.  . multivitamin (RENA-VIT) TABS tablet Take 1 tablet by mouth daily.  . nitroGLYCERIN (NITROSTAT) 0.4 MG SL tablet Place 0.4 mg under the tongue every 5 (five) minutes as needed for chest pain.   . rosuvastatin (CRESTOR) 40 MG tablet Take 40 mg by mouth daily.     Allergies:   Amoxicillin and Metoprolol   Social History   Socioeconomic History  . Marital status: Married    Spouse name: Not on file  . Number of children: 3  . Years of education: Not on file  . Highest education level: Not on file  Occupational History  . Occupation: retired  Tobacco Use  . Smoking status: Former Smoker    Types: Cigarettes    Quit date: 04/29/1975    Years since quitting: 44.8  . Smokeless tobacco: Never Used  Vaping Use  . Vaping Use: Never used  Substance and Sexual Activity  . Alcohol use: Yes    Alcohol/week: 3.0 standard drinks     Types: 3 Glasses of wine per week    Comment: 3 glasses of wine today  . Drug use: No  . Sexual activity: Not Currently  Other Topics Concern  . 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 Difficulty of Paying Living Expenses: Not on file  Food Insecurity:   . Worried About Charity fundraiser in the Last Year: Not on file  . Ran Out of Food in the Last Year: Not on file  Transportation Needs:   . Lack of Transportation (Medical): Not on file  . Lack of Transportation (Non-Medical): Not on file  Physical Activity:   . Days of Exercise per Week: Not on file  . Minutes of Exercise per Session: Not on file  Stress:   . Feeling of Stress : Not on file  Social Connections:   . Frequency of Communication with Friends and Family: Not on file  . Frequency of Social Gatherings with Friends and Family: Not on file  . Attends Religious Services: Not on file  . Active Member of Clubs or Organizations: Not on file  . Attends Archivist Meetings: Not on file  . Marital Status: Not on file     Family History: The patient's family history includes Diabetes in his father; Heart attack in his mother; Heart disease in his father and mother; Hyperlipidemia in his father; Hypertension in his father; Stroke in his sister. There is no history of Colon cancer, Stomach cancer, Rectal cancer, Esophageal cancer, Liver disease, or Kidney disease. ROS:   Please see the history of present illness.  All other systems reviewed and are negative.  EKGs/Labs/Other Studies Reviewed:    The following studies were reviewed today:  EKG:  EKG ordered today and personally reviewed.  The ekg ordered today demonstrates rate controlled atrial fibrillation nonspecific ST change  Recent Labs: 09/10/2019: B Natriuretic Peptide 284.4 09/12/2019: Magnesium 1.9 10/19/2019: Hemoglobin 12.9; Platelets 185 01/23/2020:  ALT 21; BUN 20; Creatinine, Ser 1.05; NT-Pro BNP 1,654; Potassium 4.0; Sodium 138  Recent Lipid Panel    Component Value Date/Time   CHOL 121 01/23/2020 1040   TRIG 191 (H) 01/23/2020 1040   HDL 30 (L) 01/23/2020 1040   CHOLHDL 4.0 01/23/2020 1040   CHOLHDL 3.2 04/18/2009 0241   VLDL 23 04/18/2009 0241   LDLCALC 59 01/23/2020 1040    Physical Exam:    VS:  BP 134/79   Pulse 89   Ht _0  (1.803 m)   Wt 153 lb 1.9 oz (69.5 kg)   SpO2 95%   BMI 21.36 kg/m     Wt Readings from Last 3 Encounters:  02/17/20 153 lb 1.9 oz (69.5 kg)  01/23/20 158 lb (71.7 kg)  12/26/19 160 lb 4.8 oz (72.7 kg)     GEN: He looks desponded in the office very fatigued well nourished, well developed in no acute distress HEENT: Normal NECK: No JVD; No carotid bruits LYMPHATICS: No lymphadenopathy CARDIAC: Irregular S1 variable 2-3 of 6 apical MR murmur RRR, no rubs, gallops RESPIRATORY:  Clear to auscultation without rales, wheezing or rhonchi  ABDOMEN: Soft, non-tender, non-distended MUSCULOSKELETAL:  No edema; No deformity  SKIN: Warm and dry NEUROLOGIC:  Alert and oriented x 3 PSYCHIATRIC:  Normal affect    Signed, Shirlee More, MD  02/17/2020 3:59 PM    [place]

## 2020-02-17 ENCOUNTER — Other Ambulatory Visit: Payer: Self-pay

## 2020-02-17 ENCOUNTER — Encounter: Payer: Self-pay | Admitting: Cardiology

## 2020-02-17 ENCOUNTER — Ambulatory Visit: Payer: Medicare HMO | Admitting: Cardiology

## 2020-02-17 VITALS — BP 134/79 | HR 89 | Ht 71.0 in | Wt 153.1 lb

## 2020-02-17 DIAGNOSIS — I25118 Atherosclerotic heart disease of native coronary artery with other forms of angina pectoris: Secondary | ICD-10-CM | POA: Diagnosis not present

## 2020-02-17 DIAGNOSIS — I110 Hypertensive heart disease with heart failure: Secondary | ICD-10-CM | POA: Diagnosis not present

## 2020-02-17 DIAGNOSIS — Z7901 Long term (current) use of anticoagulants: Secondary | ICD-10-CM | POA: Diagnosis not present

## 2020-02-17 DIAGNOSIS — D49511 Neoplasm of unspecified behavior of right kidney: Secondary | ICD-10-CM | POA: Diagnosis not present

## 2020-02-17 DIAGNOSIS — R351 Nocturia: Secondary | ICD-10-CM | POA: Diagnosis not present

## 2020-02-17 DIAGNOSIS — N401 Enlarged prostate with lower urinary tract symptoms: Secondary | ICD-10-CM | POA: Diagnosis not present

## 2020-02-17 DIAGNOSIS — R350 Frequency of micturition: Secondary | ICD-10-CM | POA: Diagnosis not present

## 2020-02-17 DIAGNOSIS — I480 Paroxysmal atrial fibrillation: Secondary | ICD-10-CM

## 2020-02-17 DIAGNOSIS — I340 Nonrheumatic mitral (valve) insufficiency: Secondary | ICD-10-CM

## 2020-02-17 DIAGNOSIS — R3912 Poor urinary stream: Secondary | ICD-10-CM | POA: Diagnosis not present

## 2020-02-17 DIAGNOSIS — I5032 Chronic diastolic (congestive) heart failure: Secondary | ICD-10-CM | POA: Diagnosis not present

## 2020-02-17 MED ORDER — RIVAROXABAN 20 MG PO TABS: 20.0000 mg | ORAL_TABLET | Freq: Every day | ORAL | 3 refills | Status: DC

## 2020-02-17 MED ORDER — FUROSEMIDE 20 MG PO TABS
20.0000 mg | ORAL_TABLET | Freq: Every day | ORAL | 3 refills | Status: DC
Start: 2020-02-17 — End: 2020-02-22

## 2020-02-17 NOTE — Patient Instructions (Signed)
Medication Instructions:  Your physician has recommended you make the following change in your medication:  STOP: Eliquis  START: Xarelto 20 mg take one tablet by mouth daily.  Take your furosemide 20 mg upon your arrival home today. Then take furosemide 20 mg dialy.  *If you need a refill on your cardiac medications before your next appointment, please call your pharmacy*   Lab Work: Your physician recommends that you return for lab work in: TODAY BMP, CBC, PT, ProBNP If you have labs (blood work) drawn today and your tests are completely normal, you will receive your results only by: Marland Kitchen MyChart Message (if you have MyChart) OR . A paper copy in the mail If you have any lab test that is abnormal or we need to change your treatment, we will call you to review the results.   Testing/Procedures: You are scheduled for a TEE Cardioversion on Wednesday October 27th with Dr. Oval Linsey.  Please arrive at the [facility] (Main Entrance A) at [facility]: [address] [place], [place] [address] at 12 pm. (1 hour prior to procedure unless lab work is needed; if lab work is needed arrive 1.5 hours ahead)  DIET: Nothing to eat or drink after midnight except a sip of water with medications (see medication instructions below)  Medication Instructions:  Continue your anticoagulant: Xarelto You will need to continue your anticoagulant after your procedure until you are told by your  Provider that it is safe to stop  You must have a responsible person to drive you home and stay in the waiting area during your procedure. Failure to do so could result in cancellation.  Bring your insurance cards.  *Special Note: Every effort is made to have your procedure done on time. Occasionally there are emergencies that occur at the hospital that may cause delays. Please be patient if a delay does occur.      Follow-Up: At [facility], you and your health needs are our priority.  As part of our  continuing mission to provide you with exceptional heart care, we have created designated Provider Care Teams.  These Care Teams include your primary Cardiologist (physician) and Advanced Practice Providers (APPs -  Physician Assistants and Nurse Practitioners) who all work together to provide you with the care you need, when you need it.  We recommend signing up for the patient portal called "MyChart".  Sign up information is provided on this After Visit Summary.  MyChart is used to connect with patients for Virtual Visits (Telemedicine).  Patients are able to view lab/test results, encounter notes, upcoming appointments, etc.  Non-urgent messages can be sent to your provider as well.   To learn more about what you can do with MyChart, go to [email or web address].    Your next appointment:   2 week(s)  The format for your next appointment:   In Person  Provider:   Shirlee More, MD or Berniece Salines   Other Instructions

## 2020-02-18 LAB — CBC
Hematocrit: 42.9 % (ref 37.5–51.0)
Hemoglobin: 14.1 g/dL (ref 13.0–17.7)
MCH: 29.9 pg (ref 26.6–33.0)
MCHC: 32.9 g/dL (ref 31.5–35.7)
MCV: 91 fL (ref 79–97)
Platelets: 123 10*3/uL — ABNORMAL LOW (ref 150–450)
RBC: 4.72 x10E6/uL (ref 4.14–5.80)
RDW: 13.5 % (ref 11.6–15.4)
WBC: 7.5 10*3/uL (ref 3.4–10.8)

## 2020-02-18 LAB — BASIC METABOLIC PANEL
BUN/Creatinine Ratio: 21 (ref 10–24)
BUN: 22 mg/dL (ref 8–27)
CO2: 22 mmol/L (ref 20–29)
Calcium: 8.7 mg/dL (ref 8.6–10.2)
Chloride: 101 mmol/L (ref 96–106)
Creatinine, Ser: 1.07 mg/dL (ref 0.76–1.27)
GFR calc Af Amer: 71 mL/min/{1.73_m2} (ref >59)
GFR calc non Af Amer: 62 mL/min/{1.73_m2} (ref >59)
Glucose: 136 mg/dL — ABNORMAL HIGH (ref 65–99)
Potassium: 4.1 mmol/L (ref 3.5–5.2)
Sodium: 136 mmol/L (ref 134–144)

## 2020-02-18 LAB — PROTIME-INR
INR: 1 (ref 0.9–1.2)
Prothrombin Time: 10.7 s (ref 9.1–12.0)

## 2020-02-18 LAB — PRO B NATRIURETIC PEPTIDE: NT-Pro BNP: 2927 pg/mL — ABNORMAL HIGH (ref 0–486)

## 2020-02-20 ENCOUNTER — Other Ambulatory Visit (HOSPITAL_COMMUNITY)
Admission: RE | Admit: 2020-02-20 | Discharge: 2020-02-20 | Disposition: A | Discharge status: Home / Self Care | Payer: Medicare HMO | Source: Ambulatory Visit | Attending: Cardiovascular Disease | Admitting: Cardiovascular Disease

## 2020-02-20 DIAGNOSIS — R042 Hemoptysis: Secondary | ICD-10-CM | POA: Diagnosis not present

## 2020-02-20 DIAGNOSIS — Z20822 Contact with and (suspected) exposure to covid-19: Secondary | ICD-10-CM | POA: Diagnosis not present

## 2020-02-20 DIAGNOSIS — Z01812 Encounter for preprocedural laboratory examination: Secondary | ICD-10-CM | POA: Diagnosis not present

## 2020-02-20 DIAGNOSIS — J9601 Acute respiratory failure with hypoxia: Secondary | ICD-10-CM | POA: Diagnosis not present

## 2020-02-20 DIAGNOSIS — I480 Paroxysmal atrial fibrillation: Secondary | ICD-10-CM | POA: Diagnosis not present

## 2020-02-20 DIAGNOSIS — I5033 Acute on chronic diastolic (congestive) heart failure: Secondary | ICD-10-CM | POA: Diagnosis not present

## 2020-02-20 DIAGNOSIS — E785 Hyperlipidemia, unspecified: Secondary | ICD-10-CM | POA: Diagnosis not present

## 2020-02-20 DIAGNOSIS — N2889 Other specified disorders of kidney and ureter: Secondary | ICD-10-CM | POA: Diagnosis not present

## 2020-02-20 DIAGNOSIS — Z7189 Other specified counseling: Secondary | ICD-10-CM | POA: Diagnosis not present

## 2020-02-20 LAB — SARS CORONAVIRUS 2 (TAT 6-24 HRS): SARS Coronavirus 2: NEGATIVE

## 2020-02-21 ENCOUNTER — Telehealth: Payer: Self-pay

## 2020-02-21 NOTE — Telephone Encounter (Signed)
Spoke with patient regarding results and recommendation.  Patient verbalizes understanding and is agreeable to plan of care. Advised patient to call back with any issues or concerns.  

## 2020-02-21 NOTE — Telephone Encounter (Signed)
Left message on patients voicemail to please return our call.   

## 2020-02-22 ENCOUNTER — Encounter (HOSPITAL_COMMUNITY)
Admission: RE | Disposition: A | Discharge status: Home / Self Care | Payer: Self-pay | Source: Home / Self Care | Attending: Cardiovascular Disease

## 2020-02-22 ENCOUNTER — Other Ambulatory Visit: Payer: Self-pay

## 2020-02-22 ENCOUNTER — Ambulatory Visit (HOSPITAL_COMMUNITY): Payer: Medicare HMO | Admitting: Certified Registered Nurse Anesthetist

## 2020-02-22 ENCOUNTER — Ambulatory Visit (HOSPITAL_BASED_OUTPATIENT_CLINIC_OR_DEPARTMENT_OTHER): Payer: Medicare HMO

## 2020-02-22 ENCOUNTER — Ambulatory Visit (HOSPITAL_COMMUNITY)
Admission: RE | Admit: 2020-02-22 | Discharge: 2020-02-22 | Disposition: A | Discharge status: Home / Self Care | Payer: Medicare HMO | Attending: Cardiovascular Disease | Admitting: Cardiovascular Disease

## 2020-02-22 ENCOUNTER — Encounter (HOSPITAL_COMMUNITY): Payer: Self-pay | Admitting: Cardiovascular Disease

## 2020-02-22 DIAGNOSIS — I5032 Chronic diastolic (congestive) heart failure: Secondary | ICD-10-CM | POA: Diagnosis not present

## 2020-02-22 DIAGNOSIS — I4819 Other persistent atrial fibrillation: Secondary | ICD-10-CM

## 2020-02-22 DIAGNOSIS — Z7901 Long term (current) use of anticoagulants: Secondary | ICD-10-CM | POA: Insufficient documentation

## 2020-02-22 DIAGNOSIS — E785 Hyperlipidemia, unspecified: Secondary | ICD-10-CM | POA: Diagnosis not present

## 2020-02-22 DIAGNOSIS — I25118 Atherosclerotic heart disease of native coronary artery with other forms of angina pectoris: Secondary | ICD-10-CM | POA: Insufficient documentation

## 2020-02-22 DIAGNOSIS — Z87891 Personal history of nicotine dependence: Secondary | ICD-10-CM | POA: Diagnosis not present

## 2020-02-22 DIAGNOSIS — I4891 Unspecified atrial fibrillation: Secondary | ICD-10-CM

## 2020-02-22 DIAGNOSIS — F418 Other specified anxiety disorders: Secondary | ICD-10-CM | POA: Diagnosis not present

## 2020-02-22 DIAGNOSIS — I340 Nonrheumatic mitral (valve) insufficiency: Secondary | ICD-10-CM | POA: Insufficient documentation

## 2020-02-22 DIAGNOSIS — I480 Paroxysmal atrial fibrillation: Secondary | ICD-10-CM | POA: Diagnosis not present

## 2020-02-22 DIAGNOSIS — I110 Hypertensive heart disease with heart failure: Secondary | ICD-10-CM | POA: Diagnosis not present

## 2020-02-22 DIAGNOSIS — I25110 Atherosclerotic heart disease of native coronary artery with unstable angina pectoris: Secondary | ICD-10-CM | POA: Diagnosis not present

## 2020-02-22 DIAGNOSIS — E440 Moderate protein-calorie malnutrition: Secondary | ICD-10-CM | POA: Diagnosis not present

## 2020-02-22 HISTORY — PX: TEE WITHOUT CARDIOVERSION: SHX5443

## 2020-02-22 HISTORY — PX: CARDIOVERSION: SHX1299

## 2020-02-22 LAB — ECHO TEE
MV M vel: 5.15 m/s
MV Peak grad: 106.1 mmHg
Radius: 0.65 cm

## 2020-02-22 SURGERY — ECHOCARDIOGRAM, TRANSESOPHAGEAL
Anesthesia: Monitor Anesthesia Care

## 2020-02-22 MED ORDER — LACTATED RINGERS IV SOLN: INTRAVENOUS | Status: DC

## 2020-02-22 MED ORDER — SODIUM CHLORIDE 0.9 % IV SOLN: INTRAVENOUS | Status: DC

## 2020-02-22 MED ORDER — PERFLUTREN LIPID MICROSPHERE
INTRAVENOUS | Status: AC
  Filled 2020-02-22: qty 10

## 2020-02-22 MED ORDER — SODIUM CHLORIDE 0.9 % IV SOLN: INTRAVENOUS | Status: DC | PRN

## 2020-02-22 MED ORDER — EPHEDRINE SULFATE 50 MG/ML IJ SOLN
INTRAMUSCULAR | Status: DC | PRN
  Administered 2020-02-22 (×2): 5 mg via INTRAVENOUS

## 2020-02-22 MED ORDER — PHENYLEPHRINE HCL (PRESSORS) 10 MG/ML IV SOLN
INTRAVENOUS | Status: DC | PRN
  Administered 2020-02-22: 80 ug via INTRAVENOUS
  Administered 2020-02-22: 120 ug via INTRAVENOUS

## 2020-02-22 MED ORDER — PHENYLEPHRINE HCL-NACL 10-0.9 MG/250ML-% IV SOLN
INTRAVENOUS | Status: DC | PRN
  Administered 2020-02-22: 25 ug/min via INTRAVENOUS

## 2020-02-22 MED ORDER — FUROSEMIDE 20 MG PO TABS
40.0000 mg | ORAL_TABLET | Freq: Every day | ORAL | 3 refills | Status: DC
Start: 2020-02-22 — End: 2020-02-22

## 2020-02-22 MED ORDER — PERFLUTREN LIPID MICROSPHERE
1.0000 mL | INTRAVENOUS | Status: DC | PRN
  Administered 2020-02-22: 2 mL via INTRAVENOUS
  Filled 2020-02-22: qty 10

## 2020-02-22 MED ORDER — PROPOFOL 500 MG/50ML IV EMUL
INTRAVENOUS | Status: DC | PRN
  Administered 2020-02-22: 100 ug/kg/min via INTRAVENOUS

## 2020-02-22 MED ORDER — FUROSEMIDE 20 MG PO TABS
40.0000 mg | ORAL_TABLET | Freq: Two times a day (BID) | ORAL | 3 refills | Status: DC
Start: 2020-02-22 — End: 2020-04-30

## 2020-02-22 MED ORDER — LIDOCAINE 2% (20 MG/ML) 5 ML SYRINGE
INTRAMUSCULAR | Status: DC | PRN
  Administered 2020-02-22: 60 mg via INTRAVENOUS

## 2020-02-22 NOTE — H&P (Addendum)
Per Clinic note with Dr. Bettina Gavia 02/17/20;    Cardiology Office Note:    Date:  02/17/2020   ID:  [name], DOB 03/02/1929, MRN [id number]  PCP:  Marton Redwood, MD        Cardiologist:  Shirlee More, MD    Referring MD: Marton Redwood, MD    ASSESSMENT:    1. PAF (paroxysmal atrial fibrillation) ([facility])   2. Chronic anticoagulation   3. Hypertensive heart disease with chronic diastolic congestive heart failure ([place])   4. Nonrheumatic mitral valve regurgitation   5. Coronary artery disease of native artery of native heart with stable angina pectoris ([place])    PLAN:    In order of problems listed above:  1. He has had a marked decompensation coincident with recurrent atrial fibrillation.  Unfortunately he had stopped his anticoagulant for 6 days he has been back on it since Monday he is adamant that much of his illness is Eliquis and agrees to transition without a gap between now with some Xarelto and what I had like to do is set him up for TEE guided cardioversion of the first opportunity next week.  If successful will need an antiarrhythmic drug with his heart failure and I will initiate him on amiodarone 400 mg twice daily 2. Continue anticoagulant without gap transition Eliquis to Xarelto 20 mg daily 3. He has symptoms of heart failure check proBNP and take a dose of diuretic when he returns home and take it daily he has no peripheral edema. 4. Reassess by TEE, I think this is predominantly functional and decompensates in the setting of atrial fibrillation if severe we could consider mitral clip for palliation 5. Stable CAD recent cardiac catheterization report noted I spoken to both Dr. Ellyn Hack and Burt Knack he did not think he is a candidate for percutaneous intervention 6. Renal mass and strongly encouraged him to follow through have his MRI done and will send a copy of the note to his urologist Dr. Abner Greenspan   Next appointment: 2 weeks in the office if he deteriorates I  told the wife to bring him to the emergency room recurrent atrial fibrillation  Medication Adjustments/Labs and Tests Ordered: Current medicines are reviewed at length with the patient today.  Concerns regarding medicines are outlined above.     Orders Placed This Encounter  Procedures  . CBC  . Basic metabolic panel  . Pro b natriuretic peptide (BNP)  . INR/PT  . EKG 12-Lead   No orders of the defined types were placed in this encounter.   No chief complaint on file.   History of Present Illness:    TRYSTAN AKHTAR is a 86 y.o. male with a hx of CAD previous CABG hyperlipidemia hypertension peripheral arterial disease moderate aortic regurgitation mild aortic stenosis and hospitalization this year with unstable angina pectoris heart failure and paroxysmal atrial fibrillation.  He was last seen 01/23/2020.He underwent coronary angiography 10/25/2019. The native right coronary artery was occluded and the vein graft to the right coronary artery was occluded. The native left circumflex coronary artery was occluded and sequential vein graft to M2 and M3 was occluded in its distal portion. Proximal LAD with stenosis 75%, left thoracic artery to the left anterior descending was patent with 50% distal LAD stenosis. Ejection fraction estimated 45 to 50% and there was no LV outflow tract gradient. In summary the culprit was felt to be the thrombotic occlusion of the vein graft to the right sided PDA medical therapy was  advised. With ongoing symptoms medications were adjusted last visit and referred to cardiac rehabilitation.   He has been maintained on anticoagulant therapy withEliquis and clopidogrel.   Compliance with diet, lifestyle and medications: Yes  Is a very very complicated visit he was seen at [facility] ED on Monday and I cannot access medical records. He and his wife say he was there with atrial fibrillation.  While in the ED he had lab work performed and was told he  had a renal mass. He has been seen by urology I strongly encouraged him to accept intervention if he has confined renal cancer.  Unfortunately stopped his anticoagulant for 6 days prior to ED visit and said he felt wonderful.  He has been back on his anticoagulant since that time. He is very despondent discouraged feels tired has lost his appetite and has lost significant weight and feels very weak.  He also finds himself short of breath at rest and activities however he does not have any orthopnea or peripheral edema.  Is been taking his diuretic 4 days a week.  At the end of his visit records become available in the ED hemoglobin was 14 9 platelets 149,000 white count was 14,100 creatinine 1.07 potassium 4.2 sodium normal 135.  He had a troponin drawn that was low level 0.45.  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  I independently reviewed that EKG shows atrial fibrillation controlled rate     Past Medical History:  Diagnosis Date  . AAA (abdominal aortic aneurysm) ([place])    2004, Pt stated not there now  . Anginal pain ([place])   . Anxiety   . Arthritis   . Atherosclerosis of native arteries of the extremities with intermittent claudication 12/04/2011   ABI .87 and .54 in 2012   . Atherosclerotic PVD with intermittent claudication ([place]) 02/20/2014  . BPH (benign prostatic hypertrophy)   . Bradycardia 01/12/2019  . Bradycardia on ECG 11/11/2017  . CAD (coronary artery disease)   . Carotid artery disease ([place])    Right carotid stent 2013  . Carotid artery disease without cerebral infarction [facility])    S/p right carotid stent for asymptomatic disease  2013   . Chronic diastolic (congestive) heart failure ([place]) 09/10/2019  . Chronic kidney disease, stage 3a ([place]) 09/10/2019  . Claudication ([place])   . Colon polyps    adenomatous  . Complex sleep  apnea syndrome 11/11/2017  . Conjunctival lesion 05/04/2014   Formatting of this note might be different from the original. left  . Coronary artery disease   . Coronary artery disease involving native coronary artery of native heart with unstable angina pectoris ([place]) 09/06/2012   CABG w LIMA to LAD, SVG to OM1-OM2, SVG to dx, SVG to PD/PL 1995,  Cypher stent x 2 SVG to RCA and Cypher stent to Circ Graft 05/13/07, stent of SVG to circ OM in [place] in July 2013  Cath 4/14 [facility] normal Left main, occluded mid LAD, occluded RCA SVG, occluded CFX, widely patent OM 1 SVG, occluded RCA SVG, occluded Diag 1 SVG, widely patent LAD LIMA graft;  Redo CABG Dr. Servando Snare  10/18/12 with   . Depression   . Essential hypertension 01/12/2019  . GERD (gastroesophageal reflux disease)   . Gout   . H/O hiatal hernia   . Hyperlipidemia   . Hypertension   .  Hypertension, renal disease, stage 1-4 or unspecified chronic kidney disease   . Hypothyroidism   . Malnutrition of moderate degree 08/05/2015  . MI (myocardial infarction) ([place]) 4/14  . Mixed hyperlipidemia   . Neuropathy    Hx: of in B/L toes  . OSA on CPAP 02/17/2018  . PAD (peripheral artery disease) ([place]) 08/03/2015  . PAF (paroxysmal atrial fibrillation) ([place])   . Peripheral vascular disease ([place])    with claudication  . Pre-diabetes   . RBBB 01/12/2019  . s/p CABG   . Shoulder pain 01/27/2019  . Stroke ([place])   . TIA (transient ischemic attack)    Hx: of  . Unstable angina ([place]) 09/10/2019         Past Surgical History:  Procedure Laterality Date  . CAROTID STENT INSERTION Right 2013   right at [facility]  . CATARACT EXTRACTION Bilateral    Hx: of both eyes  . COLONOSCOPY     Hx: of  . COLONOSCOPY    . CORONARY ANGIOPLASTY WITH STENT PLACEMENT  2010  . CORONARY ARTERY BYPASS GRAFT  1995  . CORONARY ARTERY BYPASS GRAFT N/A 10/18/2012   Procedure: REDO CORONARY ARTERY BYPASS GRAFTING (CABG);  Surgeon:  Grace Isaac, MD;  Location: [facility];  Service: Open Heart Surgery;  Laterality: N/A;  off pump times two using endoscopically harvested left saphenous vein  . CORONARY STENT PLACEMENT  06/2016  . ENDARTERECTOMY FEMORAL Right 08/03/2015   Procedure: RIGHT FEMORAL ENDARTERECTOMY WITH PATCH ANGIOPLASTY;  Surgeon: Serafina Mitchell, MD;  Location: [place] [place];  Service: Vascular;  Laterality: Right;  . FEMORAL-POPLITEAL BYPASS GRAFT Right 08/03/2015   Procedure: RIGHT FEMORAL-BELOW KNEE POPLITEAL ARTERY BYPASS GRAFT;  Surgeon: Serafina Mitchell, MD;  Location: [facility];  Service: Vascular;  Laterality: Right;  . FOOT SURGERY Left   . FRACTURE SURGERY Left    Hx: of Left heel  . INGUINAL HERNIA REPAIR Bilateral    X 2   . INTRAOPERATIVE TRANSESOPHAGEAL ECHOCARDIOGRAM N/A 10/18/2012   Procedure: INTRAOPERATIVE TRANSESOPHAGEAL ECHOCARDIOGRAM;  Surgeon: Grace Isaac, MD;  Location: [facility];  Service: Open Heart Surgery;  Laterality: N/A;  . LEFT HEART CATH AND CORS/GRAFTS ANGIOGRAPHY N/A 10/25/2019   Procedure: LEFT HEART CATH AND CORS/GRAFTS ANGIOGRAPHY;  Surgeon: Leonie Man, MD;  Location: [facility] CV LAB;  Service: Cardiovascular;  Laterality: N/A;  . LEFT HEART CATHETERIZATION WITH CORONARY/GRAFT ANGIOGRAM N/A 02/02/2013   Procedure: LEFT HEART CATHETERIZATION WITH Beatrix Fetters;  Surgeon: Jacolyn Reedy, MD;  Location: [facility] CATH LAB;  Service: Cardiovascular;  Laterality: N/A;  . LOWER EXTREMITY ANGIOGRAM Right 06/27/2015   Procedure: Lower Extremity Angiogram;  Surgeon: Serafina Mitchell, MD;  Location: [facility] CV LAB;  Service: Cardiovascular;  Laterality: Right;  . MASTOIDECTOMY  1933  . PERIPHERAL VASCULAR CATHETERIZATION N/A 01/23/2015   Procedure: Abdominal Aortogram;  Surgeon: Serafina Mitchell, MD;  Location: [place] CV LAB;  Service: Cardiovascular;  Laterality: N/A;  . PERIPHERAL VASCULAR CATHETERIZATION N/A 06/27/2015   Procedure: Abdominal Aortogram;  Surgeon:  Serafina Mitchell, MD;  Location: [place] CV LAB;  Service: Cardiovascular;  Laterality: N/A;  . RETINAL DETACHMENT SURGERY Left    Hx: of left eye  . TONSILLECTOMY      Current Medications: Active Medications      Current Meds  Medication Sig  . ACCU-CHEK AVIVA PLUS test strip   . allopurinol (ZYLOPRIM) 100 MG tablet Take 100 mg by mouth daily.   Marland Kitchen amLODipine (NORVASC) 5  MG tablet Take 5 mg by mouth daily.   Marland Kitchen apixaban (ELIQUIS) 5 MG TABS tablet Take 1 tablet (5 mg total) by mouth 2 (two) times daily.  . Blood Glucose Monitoring Suppl (ACCU-CHEK AVIVA PLUS) w/Device KIT   . clopidogrel (PLAVIX) 75 MG tablet Take 75 mg by mouth daily.  Marland Kitchen escitalopram (LEXAPRO) 10 MG tablet Take 10 mg by mouth daily.  . furosemide (LASIX) 20 MG tablet Take 20 mg by mouth 4 (four) times a week.   Marland Kitchen HYDROcodone-acetaminophen (NORCO/VICODIN) 5-325 MG tablet Take 1 tablet by mouth every 4 (four) hours as needed.  . isosorbide mononitrate (IMDUR) 60 MG 24 hr tablet Take 1 tablet (60 mg total) by mouth daily.  . lansoprazole (PREVACID) 30 MG capsule Take 30 mg by mouth daily.  Marland Kitchen levothyroxine (SYNTHROID, LEVOTHROID) 88 MCG tablet Take 88 mcg by mouth daily before breakfast.  . metoprolol succinate (TOPROL-XL) 50 MG 24 hr tablet Take 50 mg by mouth daily.  . multivitamin (RENA-VIT) TABS tablet Take 1 tablet by mouth daily.  . nitroGLYCERIN (NITROSTAT) 0.4 MG SL tablet Place 0.4 mg under the tongue every 5 (five) minutes as needed for chest pain.   . rosuvastatin (CRESTOR) 40 MG tablet Take 40 mg by mouth daily.       Allergies:   Amoxicillin and Metoprolol   Social History        Socioeconomic History  . Marital status: Married    Spouse name: Not on file  . Number of children: 3  . Years of education: Not on file  . Highest education level: Not on file  Occupational History  . Occupation: retired  Tobacco Use  . Smoking status: Former Smoker    Types: Cigarettes    Quit date:  04/29/1975    Years since quitting: 44.8  . Smokeless tobacco: Never Used  Vaping Use  . Vaping Use: Never used  Substance and Sexual Activity  . Alcohol use: Yes    Alcohol/week: 3.0 standard drinks    Types: 3 Glasses of wine per week    Comment: 3 glasses of wine today  . Drug use: No  . Sexual activity: Not Currently  Other Topics Concern  . 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 Difficulty of Paying Living Expenses: Not on file  Food Insecurity:   . Worried About Charity fundraiser in the Last Year: Not on file  . Ran Out of Food in the Last Year: Not on file  Transportation Needs:   . Lack of Transportation (Medical): Not on file  . Lack of Transportation (Non-Medical): Not on file  Physical Activity:   . Days of Exercise per Week: Not on file  . Minutes of Exercise per Session: Not on file  Stress:   . Feeling of Stress : Not on file  Social Connections:   . Frequency of Communication with Friends and Family: Not on file  . Frequency of Social Gatherings with Friends and Family: Not on file  . Attends Religious Services: Not on file  . Active Member of Clubs or Organizations: Not on file  . Attends Archivist Meetings: Not on file  . Marital Status: Not on file     Family History: The patient's family history includes Diabetes in his father; Heart attack in his mother; Heart disease in his father and mother; Hyperlipidemia  in his father; Hypertension in his father; Stroke in his sister. There is no history of Colon cancer, Stomach cancer, Rectal cancer, Esophageal cancer, Liver disease, or Kidney disease. ROS:   Please see the history of present illness.    All other systems reviewed and are negative.  EKGs/Labs/Other Studies Reviewed:    The following studies were reviewed today:  EKG:  EKG ordered today and personally reviewed.  The  ekg ordered today demonstrates rate controlled atrial fibrillation nonspecific ST change  Recent Labs: 09/10/2019: B Natriuretic Peptide 284.4 09/12/2019: Magnesium 1.9 10/19/2019: Hemoglobin 12.9; Platelets 185 01/23/2020: ALT 21; BUN 20; Creatinine, Ser 1.05; NT-Pro BNP 1,654; Potassium 4.0; Sodium 138  Recent Lipid Panel Labs (Brief)          Component Value Date/Time   CHOL 121 01/23/2020 1040   TRIG 191 (H) 01/23/2020 1040   HDL 30 (L) 01/23/2020 1040   CHOLHDL 4.0 01/23/2020 1040   CHOLHDL 3.2 04/18/2009 0241   VLDL 23 04/18/2009 0241   LDLCALC 59 01/23/2020 1040      Physical Exam:    VS:  BP 134/79   Pulse 89   Ht _0  (1.803 m)   Wt 153 lb 1.9 oz (69.5 kg)   SpO2 95%   BMI 21.36 kg/m        Wt Readings from Last 3 Encounters:  02/17/20 153 lb 1.9 oz (69.5 kg)  01/23/20 158 lb (71.7 kg)  12/26/19 160 lb 4.8 oz (72.7 kg)     GEN: He looks desponded in the office very fatigued well nourished, well developed in no acute distress HEENT: Normal NECK: No JVD; No carotid bruits LYMPHATICS: No lymphadenopathy CARDIAC: Irregular S1 variable 2-3 of 6 apical MR murmur RRR, no rubs, gallops RESPIRATORY:  Clear to auscultation without rales, wheezing or rhonchi  ABDOMEN: Soft, non-tender, non-distended MUSCULOSKELETAL:  No edema; No deformity  SKIN: Warm and dry NEUROLOGIC:  Alert and oriented x 3 PSYCHIATRIC:  Normal affect    Signed, Shirlee More, MD  02/17/2020 3:59 PM    Dubach is a 86 y.o. male who has presented today for surgery, with the diagnosis of atrial fibrillation.  The various methods of treatment have been discussed with the patient and family. After consideration of risks, benefits and other options for treatment, the patient has consented to  Procedure(s): TRANSESOPHAGEAL ECHOCARDIOGRAM (TEE) (N/A) as a surgical intervention .  The patient's history has been reviewed, patient  examined, no change in status, stable for surgery.  I have reviewed the patient's chart and labs.  Questions were answered to the patient's satisfaction.    Skeeter Sheard C. Oval Linsey, MD, [facility]  02/22/2020 12:52 PM

## 2020-02-22 NOTE — Anesthesia Procedure Notes (Signed)
Procedure Name: MAC Date/Time: 02/22/2020 1:13 PM Performed by: Glynda Jaeger, CRNA Pre-anesthesia Checklist: Patient identified, Emergency Drugs available, Suction available, Patient being monitored and Timeout performed Patient Re-evaluated:Patient Re-evaluated prior to induction Oxygen Delivery Method: Nasal cannula

## 2020-02-22 NOTE — CV Procedure (Signed)
Brief TEE Note  LVEF 20-25%.  Global hypokinesis. Mild-moderate mitral regurgitation. Mild tricuspid regurgitation. Trivial pulmonic and aortic valve regurgitation. No LA/LAA thrombus or masses.  For additional details see full report.  Electrical Cardioversion Procedure Note Ian Cummings [id number] [birth year]  Procedure: Electrical Cardioversion Indications:  Atrial Fibrillation  Procedure Details Consent: Risks of procedure as well as the alternatives and risks of each were explained to the (patient/caregiver).  Consent for procedure obtained. Time Out: Verified patient identification, verified procedure, site/side was marked, verified correct patient position, special equipment/implants available, medications/allergies/relevent history reviewed, required imaging and test results available.  Performed  Patient placed on cardiac monitor, pulse oximetry, supplemental oxygen as necessary.  Sedation given: propofol Pacer pads placed anterior and posterior chest.  Cardioverted 1 time(s).  Cardioverted at 150J.  Evaluation Findings: Post procedure EKG shows: sinus bradycardia Complications: None Patient did tolerate procedure well.   Ian Latch, MD 02/22/2020, 1:53 PM

## 2020-02-22 NOTE — Anesthesia Preprocedure Evaluation (Addendum)
Anesthesia Evaluation  Patient identified by MRN, date of birth, ID band Patient awake    Reviewed: Allergy & Precautions, NPO status , Patient's Chart, lab work & pertinent test results  History of Anesthesia Complications Negative for: history of anesthetic complications  Airway Mallampati: III  TM Distance: >3 FB Neck ROM: Limited    Dental  (+) Dental Advisory Given   Pulmonary sleep apnea , former smoker,    Pulmonary exam normal        Cardiovascular hypertension, Pt. on home beta blockers and Pt. on medications (-) angina+ CAD, + Past MI, + CABG, + Peripheral Vascular Disease (s/p rt carotid a. stent) and +CHF  + dysrhythmias Atrial Fibrillation + Valvular Problems/Murmurs  Rhythm:Irregular Rate:Normal   '21 Cath - Prox RCA to Dist RCA lesion is 100% stenosed. Prox Cx to Dist Cx lesion is 100% stenosed with 100% stenosed side branch in 2nd Mrg. Prox LAD lesion is 75% stenosed. 2nd Diag lesion is 70% stenosed. Mid LAD-1 lesion is 65% stenosed with 60% stenosed side branch in 1st Sept. Mid LAD-2 lesion is 99% stenosed / subtotally occluded (no competetive / retrograde flow via LIMA). LIMA-LAD graft was visualized by angiography and is normal in caliber. The graft exhibits no disease. Dist LAD lesion is 50% stenosed. Seq SVG- OM2-OM3; (known occluded 2nd Limb) was visualized by angiography and is large. Origin lesion is 40% stenosed. Prox Graft STENT is 10% stenosed. Mid Graft to Dist Graft STENT is 20% stenosed. Original SVG-PDA-PL not injected due to known occlusion is large with extensive stents. ReDo SVG-dRCA graft was visualized by angiography and is large. Prox Graft to Mid Graft lesion is 100% stenosed. There is mild to moderate left ventricular systolic dysfunction. The left ventricular ejection fraction is 45-50% by visual estimate. There is no aortic valve stenosis.  '21 TTE - Inferior basal hypokinesis  . EF 50 to 55%. Mildly elevated pulmonary artery systolic pressure. Left atrial size was moderately dilated. Mild mitral valve regurgitation. Tricuspid valve regurgitation is moderate. Mild aortic valve stenosis.     Neuro/Psych PSYCHIATRIC DISORDERS Anxiety Depression TIACVA, No Residual Symptoms    GI/Hepatic Neg liver ROS, hiatal hernia, GERD  Medicated,  Endo/Other  Hypothyroidism   Renal/GU CRFRenal disease     Musculoskeletal  (+) Arthritis ,   Abdominal   Peds  Hematology  On xarelto, plavix Thrombocytopenia, Plt 123k     Anesthesia Other Findings Covid test negative   Reproductive/Obstetrics                            Anesthesia Physical Anesthesia Plan  ASA: IV  Anesthesia Plan: General and MAC   Post-op Pain Management:    Induction: Intravenous  PONV Risk Score and Plan: 2 and Treatment may vary due to age or medical condition and Propofol infusion  Airway Management Planned: Mask and Natural Airway  Additional Equipment: None  Intra-op Plan:   Post-operative Plan:   Informed Consent: I have reviewed the patients History and Physical, chart, labs and discussed the procedure including the risks, benefits and alternatives for the proposed anesthesia with the patient or authorized representative who has indicated his/her understanding and acceptance.       Plan Discussed with: CRNA and Anesthesiologist  Anesthesia Plan Comments: (May begin procedure as MAC with conversion to GA as indicated by procedure )       Anesthesia Quick Evaluation

## 2020-02-22 NOTE — Transfer of Care (Signed)
Immediate Anesthesia Transfer of Care Note  Patient: Ian Cummings  Procedure(s) Performed: TRANSESOPHAGEAL ECHOCARDIOGRAM (TEE) (N/A ) CARDIOVERSION (N/A )  Patient Location: Endoscopy Unit  Anesthesia Type:General  Level of Consciousness: awake, alert , oriented, patient cooperative and responds to stimulation  Airway & Oxygen Therapy: Patient Spontanous Breathing and Patient connected to nasal cannula oxygen  Post-op Assessment: Report given to RN and Post -op Vital signs reviewed and stable  Post vital signs: Reviewed and stable  Last Vitals:  Vitals Value Taken Time  BP 94/38 02/22/20 1359  Temp    Pulse 59 02/22/20 1400  Resp 26 02/22/20 1402  SpO2 95 % 02/22/20 1400  Vitals shown include unvalidated device data.  Last Pain:  Vitals:   02/22/20 1226  TempSrc: Oral  PainSc: 0-No pain         Complications: No complications documented.

## 2020-02-22 NOTE — Discharge Instructions (Signed)
Electrical Cardioversion Electrical cardioversion is the delivery of a jolt of electricity to restore a normal rhythm to the heart. A rhythm that is too fast or is not regular keeps the heart from pumping well. In this procedure, sticky patches or metal paddles are placed on the chest to deliver electricity to the heart from a device. This procedure may be done in an emergency if:  There is low or no blood pressure as a result of the heart rhythm.  Normal rhythm must be restored as fast as possible to protect the brain and heart from further damage.  It may save a life. This may also be a scheduled procedure for irregular or fast heart rhythms that are not immediately life-threatening. Tell a health care provider about:  Any allergies you have.  All medicines you are taking, including vitamins, herbs, eye drops, creams, and over-the-counter medicines.  Any problems you or family members have had with anesthetic medicines.  Any blood disorders you have.  Any surgeries you have had.  Any medical conditions you have.  Whether you are pregnant or may be pregnant. What are the risks? Generally, this is a safe procedure. However, problems may occur, including:  Allergic reactions to medicines.  A blood clot that breaks free and travels to other parts of your body.  The possible return of an abnormal heart rhythm within hours or days after the procedure.  Your heart stopping (cardiac arrest). This is rare. What happens before the procedure? Medicines  Your health care provider may have you start taking: ? Blood-thinning medicines (anticoagulants) so your blood does not clot as easily. ? Medicines to help stabilize your heart rate and rhythm.  Ask your health care provider about: ? Changing or stopping your regular medicines. This is especially important if you are taking diabetes medicines or blood thinners. ? Taking medicines such as aspirin and ibuprofen. These medicines can  thin your blood. Do not take these medicines unless your health care provider tells you to take them. ? Taking over-the-counter medicines, vitamins, herbs, and supplements. General instructions  Follow instructions from your health care provider about eating or drinking restrictions.  Plan to have someone take you home from the hospital or clinic.  If you will be going home right after the procedure, plan to have someone with you for 24 hours.  Ask your health care provider what steps will be taken to help prevent infection. These may include washing your skin with a germ-killing soap. What happens during the procedure?   An IV will be inserted into one of your veins.  Sticky patches (electrodes) or metal paddles may be placed on your chest.  You will be given a medicine to help you relax (sedative).  An electrical shock will be delivered. The procedure may vary among health care providers and hospitals. What can I expect after the procedure?  Your blood pressure, heart rate, breathing rate, and blood oxygen level will be monitored until you leave the hospital or clinic.  Your heart rhythm will be watched to make sure it does not change.  You may have some redness on the skin where the shocks were given. Follow these instructions at home:  Do not drive for 24 hours if you were given a sedative during your procedure.  Take over-the-counter and prescription medicines only as told by your health care provider.  Ask your health care provider how to check your pulse. Check it often.  Rest for 48 hours after the procedure or   as told by your health care provider.  Avoid or limit your caffeine use as told by your health care provider.  Keep all follow-up visits as told by your health care provider. This is important. Contact a health care provider if:  You feel like your heart is beating too quickly or your pulse is not regular.  You have a serious muscle cramp that does not go  away. Get help right away if:  You have discomfort in your chest.  You are dizzy or you feel faint.  You have trouble breathing or you are short of breath.  Your speech is slurred.  You have trouble moving an arm or leg on one side of your body.  Your fingers or toes turn cold or blue. Summary  Electrical cardioversion is the delivery of a jolt of electricity to restore a normal rhythm to the heart.  This procedure may be done right away in an emergency or may be a scheduled procedure if the condition is not an emergency.  Generally, this is a safe procedure.  After the procedure, check your pulse often as told by your health care provider. This information is not intended to replace advice given to you by your health care provider. Make sure you discuss any questions you have with your health care provider. Document Revised: 11/15/2018 Document Reviewed: 11/15/2018 Elsevier Patient Education  2020 [employer]  

## 2020-02-22 NOTE — Progress Notes (Signed)
  Echocardiogram Echocardiogram Transesophageal has been performed.  Johny Chess 02/22/2020, 2:03 PM

## 2020-02-23 ENCOUNTER — Encounter (HOSPITAL_COMMUNITY): Payer: Self-pay | Admitting: Cardiovascular Disease

## 2020-02-23 ENCOUNTER — Telehealth: Payer: Self-pay | Admitting: Cardiology

## 2020-02-23 NOTE — Progress Notes (Signed)
Post op call check on patient completed. Pt with c/o shortness of breathe even after his procedure yesterday. Advised patient and his wife to call his cardiologist on further instructions regarding his care.

## 2020-02-23 NOTE — Telephone Encounter (Signed)
° ° °  Pt's wife is calling,she said the pt's sob is getting worst and the echo result came back and the result is not good. She said pt needs to be seen sooner than Friday.

## 2020-02-23 NOTE — Anesthesia Postprocedure Evaluation (Signed)
Anesthesia Post Note  Patient: Ian Cummings  Procedure(s) Performed: TRANSESOPHAGEAL ECHOCARDIOGRAM (TEE) (N/A ) CARDIOVERSION (N/A )     Patient location during evaluation: PACU Anesthesia Type: General Level of consciousness: awake and alert Pain management: pain level controlled Vital Signs Assessment: post-procedure vital signs reviewed and stable Respiratory status: spontaneous breathing, nonlabored ventilation and respiratory function stable Cardiovascular status: stable and blood pressure returned to baseline Anesthetic complications: no   No complications documented.  Last Vitals:  Vitals:   02/22/20 1408 02/22/20 1418  BP: (!) 106/38 (!) 104/43  Pulse: (!) 53 (!) 54  Resp: 16 (!) 23  Temp:    SpO2: 93% 91%    Last Pain:  Vitals:   02/22/20 1418  TempSrc:   PainSc: 0-No pain                 Audry Pili

## 2020-02-24 ENCOUNTER — Telehealth: Payer: Self-pay | Admitting: Nurse Practitioner

## 2020-02-24 NOTE — Telephone Encounter (Signed)
Spoke to the patients wife just now and got the patient moved up to see Dr. Harriet Masson on Monday 02/27/2020. They verbalize understanding and thank me for the call back.

## 2020-02-24 NOTE — Telephone Encounter (Signed)
Called patient's listed home number to offer to schedule a Palliative Consult, no answer - left message with reason for call along with my name and contact number.  I then called wife's cell number and spoke with her regarding Palliative referral/services.  All questions were answered and she was in agreement with scheduling visit.  I have scheduled an In-person Consult for 02/28/20 @ 11 AM.

## 2020-02-24 NOTE — Telephone Encounter (Signed)
That will be fine with me. 

## 2020-02-27 ENCOUNTER — Other Ambulatory Visit: Payer: Self-pay

## 2020-02-27 ENCOUNTER — Encounter: Payer: Self-pay | Admitting: Cardiology

## 2020-02-27 ENCOUNTER — Ambulatory Visit: Payer: Medicare HMO | Admitting: Cardiology

## 2020-02-27 VITALS — BP 110/60 | HR 76 | Ht 71.0 in | Wt 150.0 lb

## 2020-02-27 DIAGNOSIS — I480 Paroxysmal atrial fibrillation: Secondary | ICD-10-CM

## 2020-02-27 DIAGNOSIS — I10 Essential (primary) hypertension: Secondary | ICD-10-CM | POA: Diagnosis not present

## 2020-02-27 DIAGNOSIS — K5730 Diverticulosis of large intestine without perforation or abscess without bleeding: Secondary | ICD-10-CM | POA: Diagnosis not present

## 2020-02-27 DIAGNOSIS — N1831 Chronic kidney disease, stage 3a: Secondary | ICD-10-CM

## 2020-02-27 DIAGNOSIS — I25110 Atherosclerotic heart disease of native coronary artery with unstable angina pectoris: Secondary | ICD-10-CM

## 2020-02-27 DIAGNOSIS — N281 Cyst of kidney, acquired: Secondary | ICD-10-CM | POA: Diagnosis not present

## 2020-02-27 DIAGNOSIS — I739 Peripheral vascular disease, unspecified: Secondary | ICD-10-CM | POA: Diagnosis not present

## 2020-02-27 DIAGNOSIS — D49511 Neoplasm of unspecified behavior of right kidney: Secondary | ICD-10-CM | POA: Diagnosis not present

## 2020-02-27 MED ORDER — AMIODARONE HCL 200 MG PO TABS: 200.0000 mg | ORAL_TABLET | Freq: Every day | ORAL | 1 refills | Status: DC

## 2020-02-27 NOTE — Patient Instructions (Signed)
Medication Instructions:  Your physician has recommended you make the following change in your medication:   START: Amiodarone 200 mg daily   *If you need a refill on your cardiac medications before your next appointment, please call your pharmacy*   Lab Work: None.  If you have labs (blood work) drawn today and your tests are completely normal, you will receive your results only by: Marland Kitchen MyChart Message (if you have MyChart) OR . A paper copy in the mail If you have any lab test that is abnormal or we need to change your treatment, we will call you to review the results.   Testing/Procedures: None   Follow-Up: At [facility], you and your health needs are our priority.  As part of our continuing mission to provide you with exceptional heart care, we have created designated Provider Care Teams.  These Care Teams include your primary Cardiologist (physician) and Advanced Practice Providers (APPs -  Physician Assistants and Nurse Practitioners) who all work together to provide you with the care you need, when you need it.  We recommend signing up for the patient portal called "MyChart".  Sign up information is provided on this After Visit Summary.  MyChart is used to connect with patients for Virtual Visits (Telemedicine).  Patients are able to view lab/test results, encounter notes, upcoming appointments, etc.  Non-urgent messages can be sent to your provider as well.   To learn more about what you can do with MyChart, go to [email or web address].    Your next appointment:   1 week(s)  The format for your next appointment:   In Person  Provider:   Shirlee More, MD   Other Instructions  Amiodarone tablets What is this medicine? AMIODARONE (a MEE oh da rone) is an antiarrhythmic drug. It helps make your heart beat regularly. Because of the side effects caused by this medicine, it is only used when other medicines have not worked. It is usually used for heartbeat problems that  may be life threatening. This medicine may be used for other purposes; ask your health care provider or pharmacist if you have questions. COMMON BRAND NAME(S): Cordarone, Pacerone What should I tell my health care provider before I take this medicine? They need to know if you have any of these conditions:  liver disease  lung disease  other heart problems  thyroid disease  an unusual or allergic reaction to amiodarone, iodine, other medicines, foods, dyes, or preservatives  pregnant or trying to get pregnant  breast-feeding How should I use this medicine? Take this medicine by mouth with a glass of water. Follow the directions on the prescription label. You can take this medicine with or without food. However, you should always take it the same way each time. Take your doses at regular intervals. Do not take your medicine more often than directed. Do not stop taking except on the advice of your doctor or health care professional. A special MedGuide will be given to you by the pharmacist with each prescription and refill. Be sure to read this information carefully each time. Talk to your pediatrician regarding the use of this medicine in children. Special care may be needed. Overdosage: If you think you have taken too much of this medicine contact a poison control center or emergency room at once. NOTE: This medicine is only for you. Do not share this medicine with others. What if I miss a dose? If you miss a dose, take it as soon as you can. If it  is almost time for your next dose, take only that dose. Do not take double or extra doses. What may interact with this medicine? Do not take this medicine with any of the following medications:  abarelix  apomorphine  arsenic trioxide  certain antibiotics like erythromycin, gemifloxacin, levofloxacin, pentamidine  certain medicines for depression like amoxapine, tricyclic antidepressants  certain medicines for fungal infections like  fluconazole, itraconazole, ketoconazole, posaconazole, voriconazole  certain medicines for irregular heart beat like disopyramide, dronedarone, ibutilide, propafenone, sotalol  certain medicines for malaria like chloroquine, halofantrine  cisapride  droperidol  haloperidol  hawthorn  maprotiline  methadone  phenothiazines like chlorpromazine, mesoridazine, thioridazine  pimozide  ranolazine  red yeast rice  vardenafil This medicine may also interact with the following medications:  antiviral medicines for HIV or AIDS  certain medicines for blood pressure, heart disease, irregular heart beat  certain medicines for cholesterol like atorvastatin, cerivastatin, lovastatin, simvastatin  certain medicines for hepatitis C like sofosbuvir and ledipasvir; sofosbuvir  certain medicines for seizures like phenytoin  certain medicines for thyroid problems  certain medicines that treat or prevent blood clots like warfarin  cholestyramine  cimetidine  clopidogrel  cyclosporine  dextromethorphan  diuretics  dofetilide  fentanyl  general anesthetics  grapefruit juice  lidocaine  loratadine  methotrexate  other medicines that prolong the QT interval (cause an abnormal heart rhythm)  procainamide  quinidine  rifabutin, rifampin, or rifapentine  St. John's Wort  trazodone  ziprasidone This list may not describe all possible interactions. Give your health care provider a list of all the medicines, herbs, non-prescription drugs, or dietary supplements you use. Also tell them if you smoke, drink alcohol, or use illegal drugs. Some items may interact with your medicine. What should I watch for while using this medicine? Your condition will be monitored closely when you first begin therapy. Often, this drug is first started in a hospital or other monitored health care setting. Once you are on maintenance therapy, visit your doctor or health care professional  for regular checks on your progress. Because your condition and use of this medicine carry some risk, it is a good idea to carry an identification card, necklace or bracelet with details of your condition, medications, and doctor or health care professional. Dennis Bast may get drowsy or dizzy. Do not drive, use machinery, or do anything that needs mental alertness until you know how this medicine affects you. Do not stand or sit up quickly, especially if you are an older patient. This reduces the risk of dizzy or fainting spells. This medicine can make you more sensitive to the sun. Keep out of the sun. If you cannot avoid being in the sun, wear protective clothing and use sunscreen. Do not use sun lamps or tanning beds/booths. You should have regular eye exams before and during treatment. Call your doctor if you have blurred vision, see halos, or your eyes become sensitive to light. Your eyes may get dry. It may be helpful to use a lubricating eye solution or artificial tears solution. If you are going to have surgery or a procedure that requires contrast dyes, tell your doctor or health care professional that you are taking this medicine. What side effects may I notice from receiving this medicine? Side effects that you should report to your doctor or health care professional as soon as possible:  allergic reactions like skin rash, itching or hives, swelling of the face, lips, or tongue  blue-gray coloring of the skin  blurred vision, seeing  blue green halos, increased sensitivity of the eyes to light  breathing problems  chest pain  dark urine  fast, irregular heartbeat  feeling faint or light-headed  intolerance to heat or cold  nausea or vomiting  pain and swelling of the scrotum  pain, tingling, numbness in feet, hands  redness, blistering, peeling or loosening of the skin, including inside the mouth  spitting up blood  stomach pain  sweating  unusual or uncontrolled movements  of body  unusually weak or tired  weight gain or loss  yellowing of the eyes or skin Side effects that usually do not require medical attention (report to your doctor or health care professional if they continue or are bothersome):  change in sex drive or performance  constipation  dizziness  headache  loss of appetite  trouble sleeping This list may not describe all possible side effects. Call your doctor for medical advice about side effects. You may report side effects to FDA at 1-800-FDA-1088. Where should I keep my medicine? Keep out of the reach of children. Store at room temperature between 20 and 25 degrees C (68 and 77 degrees F). Protect from light. Keep container tightly closed. Throw away any unused medicine after the expiration date. NOTE: This sheet is a summary. It may not cover all possible information. If you have questions about this medicine, talk to your doctor, pharmacist, or health care provider.  2020 Elsevier/Gold Standard (2018-03-17 13:44:04)

## 2020-02-27 NOTE — Progress Notes (Signed)
Cardiology Office Note:    Date:  02/27/2020   ID:  Ian Cummings, DOB [birth year], MRN [id number]  PCP:  Marton Redwood, MD  Cardiologist:  Shirlee More, MD  Electrophysiologist:  None   Referring MD: Marton Redwood, MD   Chief Complaint  Patient presents with  . Follow-up    History of Present Illness:    Ian Cummings is a 86 y.o. male with a hx of paroxysmal atrial fibrillation status post DC cardioversion recently and was converted back to sinus rhythm, he continued his anticoagulation, hypertensive heart disease, mild to moderate mitral regurgitation, peripheral artery disease, coronary artery disease.  The patient follows with Dr. Bettina Gavia and was last seen by him on February 17, 2020.  He is here today for follow-up visit given his cardioversion.  He tells me that he feels significantly weak and tired and is concerned about this.  He has no interest in doing any activities.  He is here with his wife.  Of note based on chart review He underwent coronary angiography 10/25/2019. The native right coronary artery was occluded and the vein graft to the right coronary artery was occluded. The native left circumflex coronary artery was occluded and sequential vein graft to M2 and M3 was occluded in its distal portion. Proximal LAD with stenosis 75%, left thoracic artery to the left anterior descending was patent with 50% distal LAD stenosis. Ejection fraction estimated 45 to 50% and there was no LV outflow tract gradient. In summary the culprit was felt to be the thrombotic occlusion of the vein graft to the right sided PDA medical therapy was advised.   Past Medical History:  Diagnosis Date  . AAA (abdominal aortic aneurysm) ([place])    2004, Pt stated not there now  . Anginal pain ([place])   . Anxiety   . Arthritis   . Atherosclerosis of native arteries of the extremities with intermittent claudication 12/04/2011   ABI .50 and .54 in 2012   . Atherosclerotic PVD with intermittent  claudication ([place]) 02/20/2014  . BPH (benign prostatic hypertrophy)   . Bradycardia 01/12/2019  . Bradycardia on ECG 11/11/2017  . CAD (coronary artery disease)   . Carotid artery disease ([place])    Right carotid stent 2013  . Carotid artery disease without cerebral infarction [facility])    S/p right carotid stent for asymptomatic disease  2013   . Chronic diastolic (congestive) heart failure ([place]) 09/10/2019  . Chronic kidney disease, stage 3a ([place]) 09/10/2019  . Claudication ([place])   . Colon polyps    adenomatous  . Complex sleep apnea syndrome 11/11/2017  . Conjunctival lesion 05/04/2014   Formatting of this note might be different from the original. left  . Coronary artery disease   . Coronary artery disease involving native coronary artery of native heart with unstable angina pectoris ([place]) 09/06/2012   CABG w LIMA to LAD, SVG to OM1-OM2, SVG to dx, SVG to PD/PL 1995,  Cypher stent x 2 SVG to RCA and Cypher stent to Circ Graft 05/13/07, stent of SVG to circ OM in [place] in July 2013  Cath 4/14 [facility] normal Left main, occluded mid LAD, occluded RCA SVG, occluded CFX, widely patent OM 1 SVG, occluded RCA SVG, occluded Diag 1 SVG, widely patent LAD LIMA graft;  Redo CABG Dr. Servando Snare  10/18/12 with   . Depression   . Essential hypertension 01/12/2019  . GERD (gastroesophageal reflux disease)   . Gout   . H/O hiatal hernia   .  Hyperlipidemia   . Hypertension   . Hypertension, renal disease, stage 1-4 or unspecified chronic kidney disease   . Hypothyroidism   . Malnutrition of moderate degree 08/05/2015  . MI (myocardial infarction) ([place]) 4/14  . Mixed hyperlipidemia   . Neuropathy    Hx: of in B/L toes  . OSA on CPAP 02/17/2018  . PAD (peripheral artery disease) ([place]) 08/03/2015  . PAF (paroxysmal atrial fibrillation) ([place])   . Peripheral vascular disease ([place])    with claudication  . Pre-diabetes   . RBBB 01/12/2019  . s/p CABG   . Shoulder pain 01/27/2019  . Stroke ([place])   . TIA (transient  ischemic attack)    Hx: of  . Unstable angina ([place]) 09/10/2019    Past Surgical History:  Procedure Laterality Date  . CARDIOVERSION N/A 02/22/2020   Procedure: CARDIOVERSION;  Surgeon: [name], MD;  Location: [place];  Service: Cardiovascular;  Laterality: N/A;  . CAROTID STENT INSERTION Right 2013   right at [facility]  . CATARACT EXTRACTION Bilateral    Hx: of both eyes  . COLONOSCOPY     Hx: of  . COLONOSCOPY    . CORONARY ANGIOPLASTY WITH STENT PLACEMENT  2010  . CORONARY ARTERY BYPASS GRAFT  1995  . CORONARY ARTERY BYPASS GRAFT N/A 10/18/2012   Procedure: REDO CORONARY ARTERY BYPASS GRAFTING (CABG);  Surgeon: Grace Isaac, MD;  Location: [place];  Service: Open Heart Surgery;  Laterality: N/A;  off pump times two using endoscopically harvested left saphenous vein  . CORONARY STENT PLACEMENT  06/2016  . ENDARTERECTOMY FEMORAL Right 08/03/2015   Procedure: RIGHT FEMORAL ENDARTERECTOMY WITH PATCH ANGIOPLASTY;  Surgeon: Serafina Mitchell, MD;  Location: [place];  Service: Vascular;  Laterality: Right;  . FEMORAL-POPLITEAL BYPASS GRAFT Right 08/03/2015   Procedure: RIGHT FEMORAL-BELOW KNEE POPLITEAL ARTERY BYPASS GRAFT;  Surgeon: Serafina Mitchell, MD;  Location: [place];  Service: Vascular;  Laterality: Right;  . FOOT SURGERY Left   . FRACTURE SURGERY Left    Hx: of Left heel  . INGUINAL HERNIA REPAIR Bilateral    X 2   . INTRAOPERATIVE TRANSESOPHAGEAL ECHOCARDIOGRAM N/A 10/18/2012   Procedure: INTRAOPERATIVE TRANSESOPHAGEAL ECHOCARDIOGRAM;  Surgeon: Grace Isaac, MD;  Location: [place];  Service: Open Heart Surgery;  Laterality: N/A;  . LEFT HEART CATH AND CORS/GRAFTS ANGIOGRAPHY N/A 10/25/2019   Procedure: LEFT HEART CATH AND CORS/GRAFTS ANGIOGRAPHY;  Surgeon: Leonie Man, MD;  Location: [place] CV LAB;  Service: Cardiovascular;  Laterality: N/A;  . LEFT HEART CATHETERIZATION WITH CORONARY/GRAFT ANGIOGRAM N/A 02/02/2013   Procedure: LEFT HEART CATHETERIZATION  WITH Beatrix Fetters;  Surgeon: Jacolyn Reedy, MD;  Location: [facility] CATH LAB;  Service: Cardiovascular;  Laterality: N/A;  . LOWER EXTREMITY ANGIOGRAM Right 06/27/2015   Procedure: Lower Extremity Angiogram;  Surgeon: Serafina Mitchell, MD;  Location: [place] CV LAB;  Service: Cardiovascular;  Laterality: Right;  . MASTOIDECTOMY  1933  . PERIPHERAL VASCULAR CATHETERIZATION N/A 01/23/2015   Procedure: Abdominal Aortogram;  Surgeon: Serafina Mitchell, MD;  Location: [place] CV LAB;  Service: Cardiovascular;  Laterality: N/A;  . PERIPHERAL VASCULAR CATHETERIZATION N/A 06/27/2015   Procedure: Abdominal Aortogram;  Surgeon: Serafina Mitchell, MD;  Location: [place] CV LAB;  Service: Cardiovascular;  Laterality: N/A;  . RETINAL DETACHMENT SURGERY Left    Hx: of left eye  . TEE WITHOUT CARDIOVERSION N/A 02/22/2020   Procedure: TRANSESOPHAGEAL ECHOCARDIOGRAM (TEE);  Surgeon: [name], MD;  Location: [place];  Service: Cardiovascular;  Laterality: N/A;  . TONSILLECTOMY      Current Medications: Current Meds  Medication Sig  . ACCU-CHEK AVIVA PLUS test strip   . allopurinol (ZYLOPRIM) 100 MG tablet Take 100 mg by mouth daily.   Marland Kitchen amLODipine (NORVASC) 5 MG tablet Take 5 mg by mouth daily.   . Blood Glucose Monitoring Suppl (ACCU-CHEK AVIVA PLUS) w/Device KIT   . clopidogrel (PLAVIX) 75 MG tablet Take 75 mg by mouth daily.  Marland Kitchen escitalopram (LEXAPRO) 10 MG tablet Take 10 mg by mouth daily.  . furosemide (LASIX) 20 MG tablet Take 2 tablets (40 mg total) by mouth 2 (two) times daily.  Marland Kitchen HYDROcodone-acetaminophen (NORCO/VICODIN) 5-325 MG tablet Take 1 tablet by mouth every 4 (four) hours as needed for moderate pain.   . isosorbide mononitrate (IMDUR) 30 MG 24 hr tablet Take 30 mg by mouth daily.  . isosorbide mononitrate (IMDUR) 60 MG 24 hr tablet Take 1 tablet (60 mg total) by mouth daily.  . lansoprazole (PREVACID) 30 MG capsule Take 30 mg by mouth daily.  Marland Kitchen levothyroxine  (SYNTHROID, LEVOTHROID) 88 MCG tablet Take 88 mcg by mouth daily before breakfast.  . metoprolol succinate (TOPROL-XL) 50 MG 24 hr tablet Take 50 mg by mouth daily.  . multivitamin (RENA-VIT) TABS tablet Take 1 tablet by mouth daily.  . nitroGLYCERIN (NITROSTAT) 0.4 MG SL tablet Place 0.4 mg under the tongue every 5 (five) minutes as needed for chest pain.   . rivaroxaban (XARELTO) 20 MG TABS tablet Take 1 tablet (20 mg total) by mouth daily with supper.  . rosuvastatin (CRESTOR) 40 MG tablet Take 40 mg by mouth daily.     Allergies:   Amoxicillin and Metoprolol   Social History   Socioeconomic History  . Marital status: Married    Spouse name: Not on file  . Number of children: 3  . Years of education: Not on file  . Highest education level: Not on file  Occupational History  . Occupation: retired  Tobacco Use  . Smoking status: Former Smoker    Types: Cigarettes    Quit date: 04/29/1975    Years since quitting: 44.8  . Smokeless tobacco: Never Used  Vaping Use  . Vaping Use: Never used  Substance and Sexual Activity  . Alcohol use: Yes    Alcohol/week: 3.0 standard drinks    Types: 3 Glasses of wine per week    Comment: 3 glasses of wine today  . Drug use: No  . Sexual activity: Not Currently  Other Topics Concern  . 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 Difficulty of Paying Living Expenses: Not on file  Food Insecurity:   . Worried About Charity fundraiser in the Last Year: Not on file  . Ran Out of Food in the Last Year: Not on file  Transportation Needs:   . Lack of Transportation (Medical): Not on file  . Lack of Transportation (Non-Medical): Not on file  Physical Activity:   . Days of Exercise per Week: Not on file  . Minutes of Exercise per Session: Not on file  Stress:   . Feeling of Stress : Not on file  Social Connections:   . Frequency of Communication  with Friends and Family: Not on file  . Frequency of Social Gatherings with Friends and Family: Not on file  . Attends Religious Services: Not  on file  . Active Member of Clubs or Organizations: Not on file  . Attends Archivist Meetings: Not on file  . Marital Status: Not on file     Family History: The patient's family history includes Diabetes in his father; Heart attack in his mother; Heart disease in his father and mother; Hyperlipidemia in his father; Hypertension in his father; Stroke in his sister. There is no history of Colon cancer, Stomach cancer, Rectal cancer, Esophageal cancer, Liver disease, or Kidney disease.  ROS:   Review of Systems  Constitution: Reports significant fatigue.  Negative for decreased appetite, fever and weight gain.  HENT: Negative for congestion, ear discharge, hoarse voice and sore throat.   Eyes: Negative for discharge, redness, vision loss in right eye and visual halos.  Cardiovascular: Negative for chest pain, dyspnea on exertion, leg swelling, orthopnea and palpitations.  Respiratory: Negative for cough, hemoptysis, shortness of breath and snoring.   Endocrine: Negative for heat intolerance and polyphagia.  Hematologic/Lymphatic: Negative for bleeding problem. Does not bruise/bleed easily.  Skin: Negative for flushing, nail changes, rash and suspicious lesions.  Musculoskeletal: Negative for arthritis, joint pain, muscle cramps, myalgias, neck pain and stiffness.  Gastrointestinal: Negative for abdominal pain, bowel incontinence, diarrhea and excessive appetite.  Genitourinary: Negative for decreased libido, genital sores and incomplete emptying.  Neurological: Negative for brief paralysis, focal weakness, headaches and loss of balance.  Psychiatric/Behavioral: Negative for altered mental status, depression and suicidal ideas.  Allergic/Immunologic: Negative for HIV exposure and persistent infections.    EKGs/Labs/Other Studies Reviewed:     The following studies were reviewed today:   EKG:  The ekg ordered today demonstrates atrial fibrillation with controlled ventricular rate, heart rate 83 bpm with incomplete right bundle branch block and prolonged QTC.  Compared to reports of his cardioversion on 02/15/2020 at which time the patient was noted to be in sinus rhythm.  Recent Labs: 09/10/2019: B Natriuretic Peptide 284.4 09/12/2019: Magnesium 1.9 01/23/2020: ALT 21 02/17/2020: BUN 22; Creatinine, Ser 1.07; Hemoglobin 14.1; NT-Pro BNP 2,927; Platelets 123; Potassium 4.1; Sodium 136  Recent Lipid Panel    Component Value Date/Time   CHOL 121 01/23/2020 1040   TRIG 191 (H) 01/23/2020 1040   HDL 30 (L) 01/23/2020 1040   CHOLHDL 4.0 01/23/2020 1040   CHOLHDL 3.2 04/18/2009 0241   VLDL 23 04/18/2009 0241   LDLCALC 59 01/23/2020 1040    Physical Exam:    VS:  BP 110/60 (BP Location: Left Arm, Patient Position: Sitting, Cuff Size: Normal)   Pulse 76   Ht 5' 11"  (1.803 m)   Wt 150 lb (68 kg)   SpO2 96%   BMI 20.92 kg/m     Wt Readings from Last 3 Encounters:  02/27/20 150 lb (68 kg)  02/22/20 153 lb (69.4 kg)  02/17/20 153 lb 1.9 oz (69.5 kg)     GEN: Well nourished, well developed in no acute distress HEENT: Normal NECK: No JVD; No carotid bruits LYMPHATICS: No lymphadenopathy CARDIAC: S1S2 noted,RRR, no murmurs, rubs, gallops RESPIRATORY:  Clear to auscultation without rales, wheezing or rhonchi  ABDOMEN: Soft, non-tender, non-distended, +bowel sounds, no guarding. EXTREMITIES: No edema, No cyanosis, no clubbing MUSCULOSKELETAL:  No deformity  SKIN: Warm and dry NEUROLOGIC:  Alert and oriented x 3, non-focal PSYCHIATRIC:  Normal affect, good insight  ASSESSMENT:    1. PAF (paroxysmal atrial fibrillation) ([place])   2. PAD (peripheral artery disease) ([place])   3. Primary hypertension   4. Coronary artery disease involving  native coronary artery of native heart with unstable angina pectoris ([place])   5. Chronic  kidney disease, stage 3a ([place])    PLAN:     Unfortunately the patient's back in atrial fibrillation.  At this time we are going to attempt to use antiarrhythmics to have him convert back to sinus rhythm.  He feels significantly tired and weak.  We will start the patient on amiodarone which should take 200 mg daily.  I am also I am sending the patient to be evaluated by our EP colleagues.  He does have significant ejection fraction depression based on his EF from the transesophageal echocardiogram.  I was able to discuss it with the patient and his wife. We will also need to adjust his medication for his depressed ejection fraction.  He is currently on Toprol-XL 50 mg daily.  Once his blood pressure can tolerate he will need to be on Entresto and Aldactone.    The patient is in agreement with the above plan. The patient left the office in stable condition.  The patient will follow up in 1 week with Dr. Bettina Gavia.   Medication Adjustments/Labs and Tests Ordered: Current medicines are reviewed at length with the patient today.  Concerns regarding medicines are outlined above.  Orders Placed This Encounter  Procedures  . Ambulatory referral to Cardiac Electrophysiology  . EKG 12-Lead   Meds ordered this encounter  Medications  . amiodarone (PACERONE) 200 MG tablet    Sig: Take 1 tablet (200 mg total) by mouth daily.    Dispense:  90 tablet    Refill:  1    Patient Instructions  Medication Instructions:  Your physician has recommended you make the following change in your medication:   START: Amiodarone 200 mg daily   *If you need a refill on your cardiac medications before your next appointment, please call your pharmacy*   Lab Work: None.  If you have labs (blood work) drawn today and your tests are completely normal, you will receive your results only by: Marland Kitchen MyChart Message (if you have MyChart) OR . A paper copy in the mail If you have any lab test that is abnormal or we need to  change your treatment, we will call you to review the results.   Testing/Procedures: None   Follow-Up: At [facility], you and your health needs are our priority.  As part of our continuing mission to provide you with exceptional heart care, we have created designated Provider Care Teams.  These Care Teams include your primary Cardiologist (physician) and Advanced Practice Providers (APPs -  Physician Assistants and Nurse Practitioners) who all work together to provide you with the care you need, when you need it.  We recommend signing up for the patient portal called "MyChart".  Sign up information is provided on this After Visit Summary.  MyChart is used to connect with patients for Virtual Visits (Telemedicine).  Patients are able to view lab/test results, encounter notes, upcoming appointments, etc.  Non-urgent messages can be sent to your provider as well.   To learn more about what you can do with MyChart, go to [email or web address].    Your next appointment:   1 week(s)  The format for your next appointment:   In Person  Provider:   Shirlee More, MD   Other Instructions  Amiodarone tablets What is this medicine? AMIODARONE (a MEE oh da rone) is an antiarrhythmic drug. It helps make your heart beat regularly. Because of the side effects caused  by this medicine, it is only used when other medicines have not worked. It is usually used for heartbeat problems that may be life threatening. This medicine may be used for other purposes; ask your health care provider or pharmacist if you have questions. COMMON BRAND NAME(S): Cordarone, Pacerone What should I tell my health care provider before I take this medicine? They need to know if you have any of these conditions:  liver disease  lung disease  other heart problems  thyroid disease  an unusual or allergic reaction to amiodarone, iodine, other medicines, foods, dyes, or preservatives  pregnant or trying to get  pregnant  breast-feeding How should I use this medicine? Take this medicine by mouth with a glass of water. Follow the directions on the prescription label. You can take this medicine with or without food. However, you should always take it the same way each time. Take your doses at regular intervals. Do not take your medicine more often than directed. Do not stop taking except on the advice of your doctor or health care professional. A special MedGuide will be given to you by the pharmacist with each prescription and refill. Be sure to read this information carefully each time. Talk to your pediatrician regarding the use of this medicine in children. Special care may be needed. Overdosage: If you think you have taken too much of this medicine contact a poison control center or emergency room at once. NOTE: This medicine is only for you. Do not share this medicine with others. What if I miss a dose? If you miss a dose, take it as soon as you can. If it is almost time for your next dose, take only that dose. Do not take double or extra doses. What may interact with this medicine? Do not take this medicine with any of the following medications:  abarelix  apomorphine  arsenic trioxide  certain antibiotics like erythromycin, gemifloxacin, levofloxacin, pentamidine  certain medicines for depression like amoxapine, tricyclic antidepressants  certain medicines for fungal infections like fluconazole, itraconazole, ketoconazole, posaconazole, voriconazole  certain medicines for irregular heart beat like disopyramide, dronedarone, ibutilide, propafenone, sotalol  certain medicines for malaria like chloroquine, halofantrine  cisapride  droperidol  haloperidol  [place]  maprotiline  methadone  phenothiazines like chlorpromazine, mesoridazine, thioridazine  pimozide  ranolazine  red yeast rice  vardenafil This medicine may also interact with the following  medications:  antiviral medicines for HIV or AIDS  certain medicines for blood pressure, heart disease, irregular heart beat  certain medicines for cholesterol like atorvastatin, cerivastatin, lovastatin, simvastatin  certain medicines for hepatitis C like sofosbuvir and ledipasvir; sofosbuvir  certain medicines for seizures like phenytoin  certain medicines for thyroid problems  certain medicines that treat or prevent blood clots like warfarin  cholestyramine  cimetidine  clopidogrel  cyclosporine  dextromethorphan  diuretics  dofetilide  fentanyl  general anesthetics  grapefruit juice  lidocaine  loratadine  methotrexate  other medicines that prolong the QT interval (cause an abnormal heart rhythm)  procainamide  quinidine  rifabutin, rifampin, or rifapentine  St. John's Wort  trazodone  ziprasidone This list may not describe all possible interactions. Give your health care provider a list of all the medicines, herbs, non-prescription drugs, or dietary supplements you use. Also tell them if you smoke, drink alcohol, or use illegal drugs. Some items may interact with your medicine. What should I watch for while using this medicine? Your condition will be monitored closely when you first begin therapy. Often,  this drug is first started in a hospital or other monitored health care setting. Once you are on maintenance therapy, visit your doctor or health care professional for regular checks on your progress. Because your condition and use of this medicine carry some risk, it is a good idea to carry an identification card, necklace or bracelet with details of your condition, medications, and doctor or health care professional. Dennis Bast may get drowsy or dizzy. Do not drive, use machinery, or do anything that needs mental alertness until you know how this medicine affects you. Do not stand or sit up quickly, especially if you are an older patient. This reduces the  risk of dizzy or fainting spells. This medicine can make you more sensitive to the sun. Keep out of the sun. If you cannot avoid being in the sun, wear protective clothing and use sunscreen. Do not use sun lamps or tanning beds/booths. You should have regular eye exams before and during treatment. Call your doctor if you have blurred vision, see halos, or your eyes become sensitive to light. Your eyes may get dry. It may be helpful to use a lubricating eye solution or artificial tears solution. If you are going to have surgery or a procedure that requires contrast dyes, tell your doctor or health care professional that you are taking this medicine. What side effects may I notice from receiving this medicine? Side effects that you should report to your doctor or health care professional as soon as possible:  allergic reactions like skin rash, itching or hives, swelling of the face, lips, or tongue  blue-gray coloring of the skin  blurred vision, seeing blue green halos, increased sensitivity of the eyes to light  breathing problems  chest pain  dark urine  fast, irregular heartbeat  feeling faint or light-headed  intolerance to heat or cold  nausea or vomiting  pain and swelling of the scrotum  pain, tingling, numbness in feet, hands  redness, blistering, peeling or loosening of the skin, including inside the mouth  spitting up blood  stomach pain  sweating  unusual or uncontrolled movements of body  unusually weak or tired  weight gain or loss  yellowing of the eyes or skin Side effects that usually do not require medical attention (report to your doctor or health care professional if they continue or are bothersome):  change in sex drive or performance  constipation  dizziness  headache  loss of appetite  trouble sleeping This list may not describe all possible side effects. Call your doctor for medical advice about side effects. You may report side effects  to FDA at 1-800-FDA-1088. Where should I keep my medicine? Keep out of the reach of children. Store at room temperature between 20 and 25 degrees C (68 and 77 degrees F). Protect from light. Keep container tightly closed. Throw away any unused medicine after the expiration date. NOTE: This sheet is a summary. It may not cover all possible information. If you have questions about this medicine, talk to your doctor, pharmacist, or health care provider.  2020 Elsevier/Gold Standard (2018-03-17 13:44:04)      Adopting a Healthy Lifestyle.  Know what a healthy weight is for you (roughly BMI <25) and aim to maintain this   Aim for 7+ servings of fruits and vegetables daily   65-80+ fluid ounces of water or unsweet tea for healthy kidneys   Limit to max 1 drink of alcohol per day; avoid smoking/tobacco   Limit animal fats in diet  for cholesterol and heart health - choose grass fed whenever available   Avoid highly processed foods, and foods high in saturated/trans fats   Aim for low stress - take time to unwind and care for your mental health   Aim for 150 min of moderate intensity exercise weekly for heart health, and weights twice weekly for bone health   Aim for 7-9 hours of sleep daily   When it comes to diets, agreement about the perfect plan isnt easy to find, even among the experts. Experts at the [employer] developed an idea known as the Healthy Eating Plate. Just imagine a plate divided into logical, healthy portions.   The emphasis is on diet quality:   Load up on vegetables and fruits - one-half of your plate: Aim for color and variety, and remember that potatoes dont count.   Go for whole grains - one-quarter of your plate: Whole wheat, barley, wheat berries, quinoa, oats, brown rice, and foods made with them. If you want pasta, go with whole wheat pasta.   Protein power - one-quarter of your plate: Fish, chicken, beans, and nuts are all healthy,  versatile protein sources. Limit red meat.   The diet, however, does go beyond the plate, offering a few other suggestions.   Use healthy plant oils, such as olive, canola, soy, corn, sunflower and peanut. Check the labels, and avoid partially hydrogenated oil, which have unhealthy trans fats.   If youre thirsty, drink water. Coffee and tea are good in moderation, but skip sugary drinks and limit milk and dairy products to one or two daily servings.   The type of carbohydrate in the diet is more important than the amount. Some sources of carbohydrates, such as vegetables, fruits, whole grains, and beans-are healthier than others.   Finally, stay active  Signed, Berniece Salines, DO  02/27/2020 5:05 PM    [place]

## 2020-02-28 ENCOUNTER — Other Ambulatory Visit: Payer: Medicare HMO | Admitting: Nurse Practitioner

## 2020-02-29 ENCOUNTER — Other Ambulatory Visit: Payer: Medicare HMO | Admitting: Nurse Practitioner

## 2020-02-29 ENCOUNTER — Other Ambulatory Visit: Payer: Self-pay

## 2020-02-29 DIAGNOSIS — Z515 Encounter for palliative care: Secondary | ICD-10-CM | POA: Diagnosis not present

## 2020-02-29 DIAGNOSIS — R5383 Other fatigue: Secondary | ICD-10-CM | POA: Diagnosis not present

## 2020-02-29 NOTE — Progress Notes (Signed)
[facility] Consult Note Telephone: [phone]  Fax: [phone]  PATIENT NAME: Ian Cummings [name] [id number] [phone] (home)  DOB: 07-17-1928 MRN: [id number]  PRIMARY CARE PROVIDER:    Marton Redwood, MD,  [place] [address] [phone]  REFERRING PROVIDER:   Marton Redwood, [place],  [place] [address] [phone]  RESPONSIBLE PARTY:   Extended Emergency Contact Information Primary Emergency Contact: Carlean Jews Address: [address]          [place],  [address] Johnnette Litter of [place] Phone: [phone] Relation: Spouse Secondary Emergency Contact: Alexis Goodell [place] of [place] Phone: [phone] Relation: Son  I met face to face with patient and wife family in home.   ASSESSMENT AND RECOMMENDATIONS:   Palliative Care was asked to follow this patient by consultation request of Marton Redwood, MD to help address advance care planning and goals of care. Palliative Care was asked to help address goals of care. This is an initial visit. Today's visit consisted of building trust and discussions on Palliative care medicine as a specialized medical care for people living with serious illness, aimed at facilitating improved quality of life through symptoms relief, assisting with advance care planning and establishing goals of care. Patient expressed appreciation for education provided on Palliative care and how it differs from Hospice service. Palliative care will continue to provide support to patient, family and the medical team. Goal of care: Patient's goal of care is comfort. Patient desires to feel good, report not having any long term goals. Want to pass without pain, preferably in his home.  Directives: Reiterated desire to not be resuscitated in the event of cardiac or respiratory arrest. 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 Patient would however want treatment for acute illness or for disease like cancer if treatment is offered. MOST form discussed with patient and wife. Blank MOST form and information material on MOST left with patient and wife to review and discuss, plan to complete at next visit.  2. Symptom Management:  Fatigue: Patient with ongoing fatigue with significant activity intolerance. Patient report improvement in the last one week. Had failed cardioversion, started on Amidarone 233m by mouth daily. Patient denied chest pain or palpitation, HR 56 on exam today. Patient report stopping Crestor by advise of his son who is into dieting. Wife has reached out to his PCP for advise. Patient made aware that Statins is important in cardiac health in reducing cholesterol to prevent occlusion the arteries.  Recommendation: Continue plan of care per Cardiology. Consider restarting Statins. Pace self, activity as tolerated. Change position gradually, ask for assistance if needed. Fall precautions important, consider using assistive device for ambulation if worsening fatigue.   Renal mass: Had incidental finding of renal mass on CT scan, report having confirmatory MRI on 02/27/2020, has appointment with Urology on 03/09/2020 to discuss findings and treatment options. Patient denied abdominal pain or any urinary symptoms today.  3. Follow up Palliative Care Visit: Palliative care will continue to follow for goals of care clarification and symptom management. Return in about 3 weeks or prn to discuss urology visit findings and plan for renal mass.  4. Family /Caregiver/Community Supports: Patient is married, lives at home with wife. Patient has three sons. Per wife, patient loves to cook when able.   5. Cognitive / Functional decline: Patient awake, alert and  coherent, able to make his own decisions. Patient able to complete his ADLs independently with standby assist, endorsed  ongoing fatigue. Ambulates without assistive device, no report of falls. Poor appetite, improving in the last week.  I spent 60 minutes providing this consultation, time includes time spent with patient and family, chart review, provider coordination, and documentation. More than 50% of the time in this consultation was spent coordinating communication.   HISTORY OF PRESENT ILLNESS:  Ian Cummings is a 86 y.o. year old male with multiple medical problems including paroxysmal atrial fibrillation s/p recent DC cardioversion, chronic anticoagulation (on Xaralto) PAD, HTN, CKD 3, hypothyroidism, hx of CABG. Per recent cardiology report, patient underwent coronary angiography 10/25/2019, findings showed the native right coronary artery was occluded and the vein graft to the right coronary artery was occluded. The native left circumflex coronary artery was occluded and sequential vein graft to M2 and M3 was occluded in its distal portion. Proximal LAD with stenosis 75%, left thoracic artery to the left anterior descending was patent with 50% distal LAD stenosis. Ejection fraction estimated 45 to 50% and there was no LV outflow tract gradient. Patient started on Amiodarone on 02/27/2020 for A-fib due to failed cardioversion.  CODE STATUS: DNR  PPS: 50%  HOSPICE ELIGIBILITY/DIAGNOSIS: TBD  PHYSICAL EXAM / ROS:   Current and past weights: 150lbs, Ht 54f11", BMI 20.9kg/m2 General: chronically ill and frail appearing, thin, lying in recliner in his bedroom in no acute distress Cardiovascular: denied chest pain, denied palpitation, no edema, S1S2 heard Pulmonary: no cough, no increased SOB, sats 96% on room air GI: no swallowing issues, appetite fair, denied constipation, continent of bowel GU: denies dysuria, continent of urine MSK:  no joint and ROM abnormalities, ambulatory Skin: no rashes or wounds reported, skin thin and fragile Neurological: weakness, but otherwise non-focal  PAST MEDICAL HISTORY:    Past Medical History:  Diagnosis Date  . AAA (abdominal aortic aneurysm) ([facility]    2004, Pt stated not there now  . Anginal pain ([facility]   . Anxiety   . Arthritis   . Atherosclerosis of native arteries of the extremities with intermittent claudication 12/04/2011   ABI .523and .54 in 2012   . Atherosclerotic PVD with intermittent claudication ([facility] 02/20/2014  . BPH (benign prostatic hypertrophy)   . Bradycardia 01/12/2019  . Bradycardia on ECG 11/11/2017  . CAD (coronary artery disease)   . Carotid artery disease ([facility]    Right carotid stent 2013  . Carotid artery disease without cerebral infarction ([facility]    S/p right carotid stent for asymptomatic disease  2013   . Chronic diastolic (congestive) heart failure ([facility] 09/10/2019  . Chronic kidney disease, stage 3a ([facility] 09/10/2019  . Claudication ([facility]   . Colon polyps    adenomatous  . Complex sleep apnea syndrome 11/11/2017  . Conjunctival lesion 05/04/2014   Formatting of this note might be different from the original. left  . Coronary artery disease   . Coronary artery disease involving native coronary artery of native heart with unstable angina pectoris ([facility] 09/06/2012   CABG w LIMA to LAD, SVG to OM1-OM2, SVG to dx, SVG to PD/PL 1995,  Cypher stent x 2 SVG to RCA and Cypher stent to Circ Graft 05/13/07, [place] circ [place]  [facility], occluded mid LAD, occluded RCA SVG, occluded CFX, widely patent OM 1 SVG, occluded RCA SVG, occluded Diag 1 SVG, widely patent  LAD LIMA graft;  Redo CABG Dr. Servando Snare  10/18/12 with   . Depression   . Essential hypertension 01/12/2019  . GERD (gastroesophageal reflux disease)   . Gout   . H/O hiatal hernia   . Hyperlipidemia   . Hypertension   . Hypertension, renal disease, stage 1-4 or unspecified chronic kidney disease   . Hypothyroidism   . Malnutrition of moderate degree 08/05/2015  . MI (myocardial infarction) ([place]) 4/14  . Mixed hyperlipidemia   .  Neuropathy    Hx: of in B/L toes  . OSA on CPAP 02/17/2018  . PAD (peripheral artery disease) ([place]) 08/03/2015  . PAF (paroxysmal atrial fibrillation) ([place])   . Peripheral vascular disease ([place])    with claudication  . Pre-diabetes   . RBBB 01/12/2019  . s/p CABG   . Shoulder pain 01/27/2019  . Stroke ([place])   . TIA (transient ischemic attack)    Hx: of  . Unstable angina ([place]) 09/10/2019    SOCIAL HX:  Social History   Tobacco Use  . Smoking status: Former Smoker    Types: Cigarettes    Quit date: 04/29/1975    Years since quitting: 44.8  . Smokeless tobacco: Never Used  Substance Use Topics  . Alcohol use: Yes    Alcohol/week: 3.0 standard drinks    Types: 3 Glasses of wine per week    Comment: 3 glasses of wine today   FAMILY HX:  Family History  Problem Relation Age of Onset  . Heart disease Mother        After 26 yrs of age  . Heart attack Mother   . Hyperlipidemia Father   . Hypertension Father   . Heart disease Father        After 68 yrs of age  . Diabetes Father   . Stroke Sister   . Colon cancer Neg Hx   . Stomach cancer Neg Hx   . Rectal cancer Neg Hx   . Esophageal cancer Neg Hx   . Liver disease Neg Hx   . Kidney disease Neg Hx     ALLERGIES:  Allergies  Allergen Reactions  . Amoxicillin Rash  . Metoprolol Itching    When out in the sun, pt is currently taking.     PERTINENT MEDICATIONS:  Outpatient Encounter Medications as of 02/29/2020  Medication Sig  . ACCU-CHEK AVIVA PLUS test strip   . allopurinol (ZYLOPRIM) 100 MG tablet Take 100 mg by mouth daily.   Marland Kitchen amiodarone (PACERONE) 200 MG tablet Take 1 tablet (200 mg total) by mouth daily.  Marland Kitchen amLODipine (NORVASC) 5 MG tablet Take 5 mg by mouth daily.   . Blood Glucose Monitoring Suppl (ACCU-CHEK AVIVA PLUS) w/Device KIT   . clopidogrel (PLAVIX) 75 MG tablet Take 75 mg by mouth daily.  Marland Kitchen escitalopram (LEXAPRO) 10 MG tablet Take 10 mg by mouth daily.  . furosemide (LASIX) 20 MG tablet Take 2 tablets  (40 mg total) by mouth 2 (two) times daily.  Marland Kitchen HYDROcodone-acetaminophen (NORCO/VICODIN) 5-325 MG tablet Take 1 tablet by mouth every 4 (four) hours as needed for moderate pain.   . isosorbide mononitrate (IMDUR) 30 MG 24 hr tablet Take 30 mg by mouth daily.  . isosorbide mononitrate (IMDUR) 60 MG 24 hr tablet Take 1 tablet (60 mg total) by mouth daily.  . lansoprazole (PREVACID) 30 MG capsule Take 30 mg by mouth daily.  Marland Kitchen levothyroxine (SYNTHROID, LEVOTHROID) 88 MCG tablet Take 88 mcg by mouth daily before breakfast.  .  metoprolol succinate (TOPROL-XL) 50 MG 24 hr tablet Take 50 mg by mouth daily.  . multivitamin (RENA-VIT) TABS tablet Take 1 tablet by mouth daily.  . nitroGLYCERIN (NITROSTAT) 0.4 MG SL tablet Place 0.4 mg under the tongue every 5 (five) minutes as needed for chest pain.   . rivaroxaban (XARELTO) 20 MG TABS tablet Take 1 tablet (20 mg total) by mouth daily with supper.  . rosuvastatin (CRESTOR) 40 MG tablet Take 40 mg by mouth daily.   No facility-administered encounter medications on file as of 02/29/2020.    Jari Favre, DNP, AGPCNP-BC

## 2020-03-02 ENCOUNTER — Ambulatory Visit: Payer: Medicare HMO | Admitting: Cardiology

## 2020-03-05 DIAGNOSIS — I480 Paroxysmal atrial fibrillation: Secondary | ICD-10-CM | POA: Insufficient documentation

## 2020-03-05 DIAGNOSIS — I639 Cerebral infarction, unspecified: Secondary | ICD-10-CM | POA: Insufficient documentation

## 2020-03-06 ENCOUNTER — Encounter: Payer: Self-pay | Admitting: Cardiology

## 2020-03-06 NOTE — Progress Notes (Signed)
Cardiology Office Note:    Date:  03/07/2020   ID:  Ian Cummings, DOB 02-21-1929, MRN [id number]  PCP:  Marton Redwood, MD  Cardiologist:  Shirlee More, MD    Referring MD: Marton Redwood, MD    ASSESSMENT:    1. PAF (paroxysmal atrial fibrillation) ([place])   2. On amiodarone therapy   3. Chronic anticoagulation   4. Hypertensive heart disease with chronic diastolic congestive heart failure ([place])   5. Coronary artery disease of native artery of native heart with stable angina pectoris ([place])   6. Essential hypertension    PLAN:    In order of problems listed above:  1. Is a very complicated case when seen today he is functionally improved he is in sinus rhythm on low-dose amiodarone and his heart failure is compensated.  His QT interval is normal I will increase his amiodarone dose continue anticoagulant and cancel referral to EP. 2. Stable hypertension heart failure is compensated with reduced EF in my estimate in the range of 35 to 40% after looking at his transesophageal echocardiogram initiate low-dose Entresto.  His wife will monitor blood pressure at home 3. Stable CAD continue intense medical therapy 4. He has a renal mass his wife tells me he is told by urology that he needs to have a nephrectomy I will put a call out to his urologist Dr. Rodman Key gave to review the case with me in view of the patient's high cardiovascular risk   Next appointment: 6 weeks   Medication Adjustments/Labs and Tests Ordered: Current medicines are reviewed at length with the patient today.  Concerns regarding medicines are outlined above.  Orders Placed This Encounter  Procedures  . EKG 12-Lead   Meds ordered this encounter  Medications  . amiodarone (PACERONE) 200 MG tablet    Sig: Take 2 tablets (400 mg total) by mouth daily.    Dispense:  180 tablet    Refill:  3  . sacubitril-valsartan (ENTRESTO) 49-51 MG    Sig: Take 1 tablet by mouth daily.    Dispense:  90 tablet    Refill:  3     Chief Complaint  Patient presents with  . Follow-up    He is initiating a low-dose amiodarone last visit  . Atrial Fibrillation    History of Present Illness:    Ian Cummings is a 86 y.o. male with a hx of paroxysmal atrial fibrillation recent successful cardioversion on unfortunately reverted back to atrial fibrillation.  He was last seen and initiated an outpatient amiodarone.  His other problems include CAD heart failure cardiomyopathy mitral regurgitation and peripheral arterial disease.  He has had coronary angiography done and has both graft occlusion native disease and I have had to with the members of the interventional team review his films that do not feel he is a candidate for further revascularization.  In particular he has deteriorated with heart failure in the setting of atrial fibrillation.  Compliance with diet, lifestyle and medications: Yes  I independently reviewed the transesophageal echocardiogram from cardioversion 02/22/2020 showing an ejection fraction severely reduced 25 to 30% with global hypokinesia right ventricular dysfunction was present the left atrium was severely dilated mitral regurgitation was graded as mild to moderate and there was no left atrial appendage thrombus.  I independently reviewed that echocardiogram I would say left ventricular dysfunction is more moderate 35 to 40% in my opinion although it is difficult to judge off the TEE and mitral regurgitation moderate.  Today if  blood pressure allows I would like to discontinue amlodipine and initiate Entresto. I independently reviewed his EKG last visit its difficult to gauge QT interval with atrial fibrillation and varying RR intervals but it clearly appears to be less than half of an RR interval and I do not see evidence of QT prolongation with his atrial fibrillation  I am somewhat surprised that he is in sinus rhythm today QT interval is normal will increase his amiodarone dosage.  Organ to try  placing him on Entresto with his LV dysfunction his heart failure is compensated having no angina.  His wife alerts me he is seeing urology Friday for what is described as softball size large renal cancer and asked me to speak to his urologist.  He clearly is in a different condition than his last visit his heart failure is compensated he is in sinus rhythm and is having no angina. Past Medical History:  Diagnosis Date  . AAA (abdominal aortic aneurysm) ([place])    2004, Pt stated not there now  . Anginal pain ([place])   . Anxiety   . Arthritis   . Atherosclerosis of native arteries of the extremities with intermittent claudication 12/04/2011   ABI .61 and .54 in 2012   . Atherosclerotic PVD with intermittent claudication ([place]) 02/20/2014  . BPH (benign prostatic hypertrophy)   . Bradycardia 01/12/2019  . Bradycardia on ECG 11/11/2017  . CAD (coronary artery disease)   . Carotid artery disease ([place])    Right carotid stent 2013  . Carotid artery disease without cerebral infarction [facility])    S/p right carotid stent for asymptomatic disease  2013   . Chronic diastolic (congestive) heart failure ([place]) 09/10/2019  . Chronic kidney disease, stage 3a ([place]) 09/10/2019  . Claudication ([place])   . Colon polyps    adenomatous  . Complex sleep apnea syndrome 11/11/2017  . Conjunctival lesion 05/04/2014   Formatting of this note might be different from the original. left  . Coronary artery disease   . Coronary artery disease involving native coronary artery of native heart with unstable angina pectoris ([place]) 09/06/2012   CABG w LIMA to LAD, SVG to OM1-OM2, SVG to dx, SVG to PD/PL 1995,  Cypher stent x 2 SVG to RCA and Cypher stent to Circ Graft 05/13/07, stent of SVG to circ OM in [place] in July 2013  Cath 4/14 [facility] normal Left main, occluded mid LAD, occluded RCA SVG, occluded CFX, widely patent OM 1 SVG, occluded RCA SVG, occluded Diag 1 SVG, widely patent LAD LIMA graft;  Redo CABG Dr. Servando Snare  10/18/12 with   .  Depression   . Essential hypertension 01/12/2019  . GERD (gastroesophageal reflux disease)   . Gout   . H/O hiatal hernia   . Hyperlipidemia   . Hypertension   . Hypertension, renal disease, stage 1-4 or unspecified chronic kidney disease   . Hypothyroidism   . Malnutrition of moderate degree 08/05/2015  . MI (myocardial infarction) ([place]) 4/14  . Mixed hyperlipidemia   . Neuropathy    Hx: of in B/L toes  . OSA on CPAP 02/17/2018  . PAD (peripheral artery disease) ([place]) 08/03/2015  . PAF (paroxysmal atrial fibrillation) ([place])   . Peripheral vascular disease ([place])    with claudication  . Pre-diabetes   . RBBB 01/12/2019  . s/p CABG   . Shoulder pain 01/27/2019  . Stroke ([place])   . TIA (transient ischemic attack)    Hx: of  . Unstable angina ([place]) 09/10/2019  Past Surgical History:  Procedure Laterality Date  . CARDIOVERSION N/A 02/22/2020   Procedure: CARDIOVERSION;  Surgeon: [name], MD;  Location: [place];  Service: Cardiovascular;  Laterality: N/A;  . CAROTID STENT INSERTION Right 2013   right at [facility]  . CATARACT EXTRACTION Bilateral    Hx: of both eyes  . COLONOSCOPY     Hx: of  . COLONOSCOPY    . CORONARY ANGIOPLASTY WITH STENT PLACEMENT  2010  . CORONARY ARTERY BYPASS GRAFT  1995  . CORONARY ARTERY BYPASS GRAFT N/A 10/18/2012   Procedure: REDO CORONARY ARTERY BYPASS GRAFTING (CABG);  Surgeon: Grace Isaac, MD;  Location: [place];  Service: Open Heart Surgery;  Laterality: N/A;  off pump times two using endoscopically harvested left saphenous vein  . CORONARY STENT PLACEMENT  06/2016  . ENDARTERECTOMY FEMORAL Right 08/03/2015   Procedure: RIGHT FEMORAL ENDARTERECTOMY WITH PATCH ANGIOPLASTY;  Surgeon: Serafina Mitchell, MD;  Location: [place];  Service: Vascular;  Laterality: Right;  . FEMORAL-POPLITEAL BYPASS GRAFT Right 08/03/2015   Procedure: RIGHT FEMORAL-BELOW KNEE POPLITEAL ARTERY BYPASS GRAFT;  Surgeon: Serafina Mitchell, MD;  Location: [place];   Service: Vascular;  Laterality: Right;  . FOOT SURGERY Left   . FRACTURE SURGERY Left    Hx: of Left heel  . INGUINAL HERNIA REPAIR Bilateral    X 2   . INTRAOPERATIVE TRANSESOPHAGEAL ECHOCARDIOGRAM N/A 10/18/2012   Procedure: INTRAOPERATIVE TRANSESOPHAGEAL ECHOCARDIOGRAM;  Surgeon: Grace Isaac, MD;  Location: [place];  Service: Open Heart Surgery;  Laterality: N/A;  . LEFT HEART CATH AND CORS/GRAFTS ANGIOGRAPHY N/A 10/25/2019   Procedure: LEFT HEART CATH AND CORS/GRAFTS ANGIOGRAPHY;  Surgeon: Leonie Man, MD;  Location: [place] CV LAB;  Service: Cardiovascular;  Laterality: N/A;  . LEFT HEART CATHETERIZATION WITH CORONARY/GRAFT ANGIOGRAM N/A 02/02/2013   Procedure: LEFT HEART CATHETERIZATION WITH Beatrix Fetters;  Surgeon: Jacolyn Reedy, MD;  Location: [facility] CATH LAB;  Service: Cardiovascular;  Laterality: N/A;  . LOWER EXTREMITY ANGIOGRAM Right 06/27/2015   Procedure: Lower Extremity Angiogram;  Surgeon: Serafina Mitchell, MD;  Location: [place] CV LAB;  Service: Cardiovascular;  Laterality: Right;  . MASTOIDECTOMY  1933  . PERIPHERAL VASCULAR CATHETERIZATION N/A 01/23/2015   Procedure: Abdominal Aortogram;  Surgeon: Serafina Mitchell, MD;  Location: [place] CV LAB;  Service: Cardiovascular;  Laterality: N/A;  . PERIPHERAL VASCULAR CATHETERIZATION N/A 06/27/2015   Procedure: Abdominal Aortogram;  Surgeon: Serafina Mitchell, MD;  Location: [place] CV LAB;  Service: Cardiovascular;  Laterality: N/A;  . RETINAL DETACHMENT SURGERY Left    Hx: of left eye  . TEE WITHOUT CARDIOVERSION N/A 02/22/2020   Procedure: TRANSESOPHAGEAL ECHOCARDIOGRAM (TEE);  Surgeon: [name], MD;  Location: [place];  Service: Cardiovascular;  Laterality: N/A;  . TONSILLECTOMY      Current Medications: Current Meds  Medication Sig  . ACCU-CHEK AVIVA PLUS test strip   . allopurinol (ZYLOPRIM) 100 MG tablet Take 100 mg by mouth daily.   Marland Kitchen amLODipine (NORVASC) 5 MG tablet Take 5 mg  by mouth daily.   . Blood Glucose Monitoring Suppl (ACCU-CHEK AVIVA PLUS) w/Device KIT   . clopidogrel (PLAVIX) 75 MG tablet Take 75 mg by mouth daily.  Marland Kitchen escitalopram (LEXAPRO) 10 MG tablet Take 10 mg by mouth daily.  . furosemide (LASIX) 20 MG tablet Take 2 tablets (40 mg total) by mouth 2 (two) times daily.  Marland Kitchen HYDROcodone-acetaminophen (NORCO/VICODIN) 5-325 MG tablet Take 1 tablet by mouth every 4 (four)  hours as needed for moderate pain.   . isosorbide mononitrate (IMDUR) 30 MG 24 hr tablet Take 30 mg by mouth daily.  . lansoprazole (PREVACID) 30 MG capsule Take 30 mg by mouth daily.  Marland Kitchen levothyroxine (SYNTHROID, LEVOTHROID) 88 MCG tablet Take 88 mcg by mouth daily before breakfast.  . metoprolol succinate (TOPROL-XL) 50 MG 24 hr tablet Take 50 mg by mouth daily.  . multivitamin (RENA-VIT) TABS tablet Take 1 tablet by mouth daily.  . nitroGLYCERIN (NITROSTAT) 0.4 MG SL tablet Place 0.4 mg under the tongue every 5 (five) minutes as needed for chest pain.   . rivaroxaban (XARELTO) 20 MG TABS tablet Take 1 tablet (20 mg total) by mouth daily with supper.  . rosuvastatin (CRESTOR) 40 MG tablet Take 40 mg by mouth daily.  . [DISCONTINUED] amiodarone (PACERONE) 200 MG tablet Take 1 tablet (200 mg total) by mouth daily.     Allergies:   Amoxicillin and Metoprolol   Social History   Socioeconomic History  . Marital status: Married    Spouse name: Not on file  . Number of children: 3  . Years of education: Not on file  . Highest education level: Not on file  Occupational History  . Occupation: retired  Tobacco Use  . Smoking status: Former Smoker    Types: Cigarettes    Quit date: 04/29/1975    Years since quitting: 44.8  . Smokeless tobacco: Never Used  Vaping Use  . Vaping Use: Never used  Substance and Sexual Activity  . Alcohol use: Yes    Alcohol/week: 3.0 standard drinks    Types: 3 Glasses of wine per week    Comment: 3 glasses of wine today  . Drug use: No  . Sexual  activity: Not Currently  Other Topics Concern  . 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 Difficulty of Paying Living Expenses: Not on file  Food Insecurity:   . Worried About Charity fundraiser in the Last Year: Not on file  . Ran Out of Food in the Last Year: Not on file  Transportation Needs:   . Lack of Transportation (Medical): Not on file  . Lack of Transportation (Non-Medical): Not on file  Physical Activity:   . Days of Exercise per Week: Not on file  . Minutes of Exercise per Session: Not on file  Stress:   . Feeling of Stress : Not on file  Social Connections:   . Frequency of Communication with Friends and Family: Not on file  . Frequency of Social Gatherings with Friends and Family: Not on file  . Attends Religious Services: Not on file  . Active Member of Clubs or Organizations: Not on file  . Attends Archivist Meetings: Not on file  . Marital Status: Not on file     Family History: The patient's family history includes Diabetes in his father; Heart attack in his mother; Heart disease in his father and mother; Hyperlipidemia in his father; Hypertension in his father; Stroke in his sister. There is no history of Colon cancer, Stomach cancer, Rectal cancer, Esophageal cancer, Liver disease, or Kidney disease. ROS:   Please see the history of present illness.    All other systems reviewed and are negative.  EKGs/Labs/Other Studies Reviewed:    The following studies were reviewed today:  EKG:  EKG ordered today and personally reviewed.  The  ekg ordered today demonstrates sinus rhythm normal QT interval  Recent Labs:   Ref Range & Units 2 wk ago 1 mo ago  NT-Pro BNP 0 - 486 pg/mL 2,927High  1,654High CM     09/10/2019: B Natriuretic Peptide 284.4 09/12/2019: Magnesium 1.9 01/23/2020: ALT 21 02/17/2020: BUN 22; Creatinine, Ser 1.07; Hemoglobin 14.1;  NT-Pro BNP 2,927; Platelets 123; Potassium 4.1; Sodium 136  Recent Lipid Panel    Component Value Date/Time   CHOL 121 01/23/2020 1040   TRIG 191 (H) 01/23/2020 1040   HDL 30 (L) 01/23/2020 1040   CHOLHDL 4.0 01/23/2020 1040   CHOLHDL 3.2 04/18/2009 0241   VLDL 23 04/18/2009 0241   LDLCALC 59 01/23/2020 1040    Physical Exam:    VS:  BP 116/70   Pulse 63   Ht 5' 11"  (1.803 m)   Wt 153 lb (69.4 kg)   SpO2 93%   BMI 21.34 kg/m     Wt Readings from Last 3 Encounters:  03/07/20 153 lb (69.4 kg)  02/27/20 150 lb (68 kg)  02/22/20 153 lb (69.4 kg)     GEN: He appears markedly improved well nourished, well developed in no acute distress HEENT: Normal NECK: No JVD; No carotid bruits LYMPHATICS: No lymphadenopathy CARDIAC: No S3 1-2 of 6 MR RRR, no murmurs, rubs, gallops RESPIRATORY:  Clear to auscultation without rales, wheezing or rhonchi  ABDOMEN: Soft, non-tender, non-distended MUSCULOSKELETAL:  No edema; No deformity  SKIN: Warm and dry NEUROLOGIC:  Alert and oriented x 3 PSYCHIATRIC:  Normal affect    Signed, Shirlee More, MD  03/07/2020 12:08 PM    [place]

## 2020-03-07 ENCOUNTER — Other Ambulatory Visit: Payer: Self-pay

## 2020-03-07 ENCOUNTER — Ambulatory Visit: Payer: Medicare HMO | Admitting: Cardiology

## 2020-03-07 ENCOUNTER — Encounter: Payer: Self-pay | Admitting: Cardiology

## 2020-03-07 VITALS — BP 116/70 | HR 63 | Ht 71.0 in | Wt 153.0 lb

## 2020-03-07 DIAGNOSIS — I5032 Chronic diastolic (congestive) heart failure: Secondary | ICD-10-CM | POA: Diagnosis not present

## 2020-03-07 DIAGNOSIS — I25118 Atherosclerotic heart disease of native coronary artery with other forms of angina pectoris: Secondary | ICD-10-CM | POA: Diagnosis not present

## 2020-03-07 DIAGNOSIS — I110 Hypertensive heart disease with heart failure: Secondary | ICD-10-CM | POA: Diagnosis not present

## 2020-03-07 DIAGNOSIS — Z7901 Long term (current) use of anticoagulants: Secondary | ICD-10-CM | POA: Diagnosis not present

## 2020-03-07 DIAGNOSIS — I10 Essential (primary) hypertension: Secondary | ICD-10-CM | POA: Diagnosis not present

## 2020-03-07 DIAGNOSIS — I480 Paroxysmal atrial fibrillation: Secondary | ICD-10-CM | POA: Diagnosis not present

## 2020-03-07 DIAGNOSIS — Z79899 Other long term (current) drug therapy: Secondary | ICD-10-CM

## 2020-03-07 MED ORDER — AMIODARONE HCL 200 MG PO TABS: 400.0000 mg | ORAL_TABLET | Freq: Every day | ORAL | 3 refills | Status: DC

## 2020-03-07 MED ORDER — ENTRESTO 49-51 MG PO TABS: 1.0000 | ORAL_TABLET | Freq: Every day | ORAL | 3 refills | Status: DC

## 2020-03-07 NOTE — Patient Instructions (Signed)
Medication Instructions:  Your physician has recommended you make the following change in your medication:  INCREASE: Amiodarone 200 mg take two tablets (400 mg total) by mouth daily in the morning.  START: Entresto 49/51 mg take one tablet by mouth daily.   *If you need a refill on your cardiac medications before your next appointment, please call your pharmacy*   Lab Work: None If you have labs (blood work) drawn today and your tests are completely normal, you will receive your results only by: Marland Kitchen MyChart Message (if you have MyChart) OR . A paper copy in the mail If you have any lab test that is abnormal or we need to change your treatment, we will call you to review the results.   Testing/Procedures: None   Follow-Up: At [facility], you and your health needs are our priority.  As part of our continuing mission to provide you with exceptional heart care, we have created designated Provider Care Teams.  These Care Teams include your primary Cardiologist (physician) and Advanced Practice Providers (APPs -  Physician Assistants and Nurse Practitioners) who all work together to provide you with the care you need, when you need it.  We recommend signing up for the patient portal called "MyChart".  Sign up information is provided on this After Visit Summary.  MyChart is used to connect with patients for Virtual Visits (Telemedicine).  Patients are able to view lab/test results, encounter notes, upcoming appointments, etc.  Non-urgent messages can be sent to your provider as well.   To learn more about what you can do with MyChart, go to [email or web address].    Your next appointment:   6 week(s)  The format for your next appointment:   In Person  Provider:   Shirlee More, MD   Other Instructions Please take your blood pressure daily and call us if it is consistently less than 100 on top.

## 2020-03-09 DIAGNOSIS — N401 Enlarged prostate with lower urinary tract symptoms: Secondary | ICD-10-CM | POA: Diagnosis not present

## 2020-03-09 DIAGNOSIS — R350 Frequency of micturition: Secondary | ICD-10-CM | POA: Diagnosis not present

## 2020-03-09 DIAGNOSIS — D49511 Neoplasm of unspecified behavior of right kidney: Secondary | ICD-10-CM | POA: Diagnosis not present

## 2020-03-09 DIAGNOSIS — R351 Nocturia: Secondary | ICD-10-CM | POA: Diagnosis not present

## 2020-03-15 ENCOUNTER — Other Ambulatory Visit: Payer: Self-pay | Admitting: Cardiology

## 2020-03-15 MED ORDER — RIVAROXABAN 20 MG PO TABS
20.0000 mg | ORAL_TABLET | Freq: Every day | ORAL | 3 refills | Status: DC
Start: 2020-03-15 — End: 2020-05-21

## 2020-03-15 NOTE — Telephone Encounter (Signed)
Refill sent in per request.  

## 2020-03-15 NOTE — Telephone Encounter (Signed)
°*  STAT* If patient is at the pharmacy, call can be transferred to refill team.   1. Which medications need to be refilled? (please list name of each medication and dose if known)  rivaroxaban (XARELTO) 20 MG TABS tablet  2. Which pharmacy/location (including street and city if local pharmacy) is medication to be sent to? [place], [place]  3. Do they need a 30 day or 90 day supply? 90 day supply

## 2020-03-16 ENCOUNTER — Other Ambulatory Visit: Payer: Medicare HMO | Admitting: Nurse Practitioner

## 2020-03-16 ENCOUNTER — Other Ambulatory Visit: Payer: Self-pay

## 2020-03-16 DIAGNOSIS — Z515 Encounter for palliative care: Secondary | ICD-10-CM

## 2020-03-16 DIAGNOSIS — F32A Depression, unspecified: Secondary | ICD-10-CM | POA: Diagnosis not present

## 2020-03-16 NOTE — Progress Notes (Signed)
[facility] Consult Note Telephone: [phone]  Fax: [phone]  PATIENT NAME: Ian Cummings [id number] [phone] (home)  DOB: 04-Jan-1929 MRN: [id number]  PRIMARY CARE PROVIDER:    Marton Redwood, MD,  [place] [address] [phone]  REFERRING PROVIDER:   Marton Redwood, [place],  [place] [address] [phone]  RESPONSIBLE PARTY:   Extended Emergency Contact Information Primary Emergency Contact: Carlean Jews Address: [address]          [place],  [address] Johnnette Litter of [place] Phone: [phone] Relation: Spouse Secondary Emergency Contact: Alexis Goodell [place] of [place] Phone: [phone] Relation: Son  I met face to face with patient and wife.  ASSESSMENT AND RECOMMENDATIONS:   1. Advance Care Planning: Goal of care: Patient's goal of care is comfort. Patient desires to feel good, report not having any long term goals. Patient desires to die without pain, preferably in his home.  Directives: Patient code status is Do Not Resuscitated. He reiterated desire to not be resuscitated in the event of cardiac or respiratory arrest. DNR form in home and on [facility] EMR. Blank MOST form was left with patient at last visit to discuss with his family, plan was to complete it when patient is ready. Patient expressed readiness to complete the form today. Discussed and reviewed sections of the form in detail, opportunity for questions given, all questions answered. Form completed and signed with patient, signed form given to patient to keep and take to his medical appointments, copy of MOST form uploaded to [facility] EMR. Details of MOST include, comfort measures, Determine use or limitation of antibiotics when infection occurs, IV fluids for a defined trial period, no feeding tube.  2. Symptom Management:  Fatigue:  Patient report ongoing fatigue and activity intolerance. Endorsed shortness of breath even with mild activity. Patient report loss of interest in his favorite interest like watching football and news on TV, saying "If I can just don't feel weak I will be better, all I want to do these days is lie or sit". Patient endorsed being depressed at times. He report that he had thought about killing himself in the past, he report not following through with the plan due to his christian belief, saying it is a sin to take ones life. Patient endorsed having a gun in his room, had thought about using it. Patient denied current suicide or homicidal ideation. Wife verbalized unawareness of suicide ideation by patient. Of note, patient is on Lexapro 73m daily for depression. Recommendation: Advised wife to remove gun and all weapons from patients room and home. Recommend increasing dose of Lexapro from 133mto 2063maily. Recommend counseling for patient, which patient declined saying he does not need counseling, he will let me know when he needs one. [facility]phone [facility] above recommendations left with his nurse. Pain: patient report slipping and hurting his left ankle 5 days ago. He report improvement in pain. On exam posterior left ankle tender to touch, no swelling or erythema noted. Patient report having surgery in the left ankle more than 20 years ago related to fall from a latter. Patient walks with slight limp due to pain from the ankle. Patient report adequate pain relief with prescribed Hydrocodone-acetaminophen 5-325m73mery 4 hrs as needed. AlreRuthine Dose the med in home. Recommendation: Recommend patient see his pcp  or go to an Urgent care clinic to evaluate the ankle if worsening pain, or swelling noted. Patient agreed to have it evaluated if symptoms persists or worsens.  Renal mass: patient had an incidental finding of renal mass on CT scan, report  having confirmatory MRI on 02/27/2020, mass thought to be cancer. Seen last week by Urologist [facility] )to discuss treatment options, patient deemed not a good surgical candidate due to his weak heart. Urology plan include repeat CT scan every 3 months to monitor mass. Patient denied abdominal pain, denied hematuria or any other urinary symptoms today.  Hospice services and philosophy was discussed and explained in detail to patient and wife. Patient expressed interest, he was unsure if it was the right choice to make at this time. Patient will have the discussion with his PCP at his next visit in 3 weeks. Palliative care will continue to provide support to patient, family and the medical team.      Follow up Palliative Care Visit: Palliative care will continue to follow for goals of care clarification and symptom management. Return in about 4 weeks or prn.  4. Family /Caregiver/Community Supports: Patient is married, lives at home with wife. Patient has three sons. Per wife, patient loves to cook when able.   5. Cognitive / Functional decline: Patient awake, alert and coherent, able to make his own decisions. Patient able to complete his ADLs independently, he however requires standby assist, becomes SOB with any activity. Ambulates without assistive device, no recent report of falls.   I spent 60 minutes providing this consultation, time includes time spent with patient and family, chart review, provider coordination, and documentation. More than 50% of the time in this consultation was spent coordinating communication.    HISTORY OF PRESENT ILLNESS:  Ian Cummings is a 86 y.o. year old male with multiple medical problems including paroxysmal atrial fibrillation s/p recent DC cardioversion, chronic anticoagulation (on Xaralto) PAD, HTN, CKD 3, hypothyroidism, hx of CABG x 2, and 12 stents . Per recent cardiology report, "patient underwent coronary angiography 10/25/2019, findings showed the  native right coronary artery was occluded and the vein graft to the right coronary artery was occluded. The native left circumflex coronary artery was occluded and sequential vein graft to M2 and M3 was occluded in its distal portion. Proximal LAD with stenosis 75%, left thoracic artery to the left anterior descending was patent with 50% distal LAD stenosis. Ejection fraction estimated 45 to 50% and there was no LV outflow tract gradient". Patient started on Amiodarone on 02/27/2020 for A-fib due to failed cardioversion. Dose was increased at last visit on 03/07/2020 from 246m daily to 2073mBID, patient not taking the evening dose due to itching, patient was also started on Entresto at the visit. This is a follow up visit from 02/29/2020.  CODE STATUS: DNR  PPS: 40%  HOSPICE ELIGIBILITY/DIAGNOSIS: TBD  PHYSICAL EXAM / ROS:   Current and past weights: 153lbs up from 150lbs two weeks ago, Ht 14f33f", BMI 21.3kg/m2 General: chronically ill and frail appearing, thin, Sitting in the dinning area in his home having breakfast in no acute distress Cardiovascular: denied chest pain, denied palpitation, no edema, heart with RRR Pulmonary: no cough, no increased SOB, sats 94% on room air GI: no swallowing issues,  denied constipation, continent of bowel GU: denies dysuria, continent of urine MSK:  ambulatory, left ankle tenderness Skin: no rashes or wounds reported, skin thin and fragile Neurological: weakness, but otherwise non-focal  PAST  MEDICAL HISTORY:  Past Medical History:  Diagnosis Date  . AAA (abdominal aortic aneurysm) ([place])    2004, Pt stated not there now  . Anginal pain ([place])   . Anxiety   . Arthritis   . Atherosclerosis of native arteries of the extremities with intermittent claudication 12/04/2011   ABI .16 and .54 in 2012   . Atherosclerotic PVD with intermittent claudication ([place]) 02/20/2014  . BPH (benign prostatic hypertrophy)   . Bradycardia 01/12/2019  . Bradycardia on ECG  11/11/2017  . CAD (coronary artery disease)   . Carotid artery disease ([place])    Right carotid stent 2013  . Carotid artery disease without cerebral infarction [facility])    S/p right carotid stent for asymptomatic disease  2013   . Chronic diastolic (congestive) heart failure ([place]) 09/10/2019  . Chronic kidney disease, stage 3a ([place]) 09/10/2019  . Claudication ([place])   . Colon polyps    adenomatous  . Complex sleep apnea syndrome 11/11/2017  . Conjunctival lesion 05/04/2014   Formatting of this note might be different from the original. left  . Coronary artery disease   . Coronary artery disease involving native coronary artery of native heart with unstable angina pectoris ([place]) 09/06/2012   CABG w LIMA to LAD, SVG to OM1-OM2, SVG to dx, SVG to PD/PL 1995,  Cypher stent x 2 SVG to RCA and Cypher stent to Circ Graft 05/13/07, stent of SVG to circ OM in [place] in July 2013  Cath 4/14 [facility] normal Left main, occluded mid LAD, occluded RCA SVG, occluded CFX, widely patent OM 1 SVG, occluded RCA SVG, occluded Diag 1 SVG, widely patent LAD LIMA graft;  Redo CABG Dr. Servando Snare  10/18/12 with   . Depression   . Essential hypertension 01/12/2019  . GERD (gastroesophageal reflux disease)   . Gout   . H/O hiatal hernia   . Hyperlipidemia   . Hypertension   . Hypertension, renal disease, stage 1-4 or unspecified chronic kidney disease   . Hypothyroidism   . Malnutrition of moderate degree 08/05/2015  . MI (myocardial infarction) ([place]) 4/14  . Mixed hyperlipidemia   . Neuropathy    Hx: of in B/L toes  . OSA on CPAP 02/17/2018  . PAD (peripheral artery disease) ([place]) 08/03/2015  . PAF (paroxysmal atrial fibrillation) ([place])   . Peripheral vascular disease ([place])    with claudication  . Pre-diabetes   . RBBB 01/12/2019  . s/p CABG   . Shoulder pain 01/27/2019  . Stroke ([place])   . TIA (transient ischemic attack)    Hx: of  . Unstable angina ([place]) 09/10/2019    SOCIAL HX:  Social History   Tobacco Use  .  Smoking status: Former Smoker    Types: Cigarettes    Quit date: 04/29/1975    Years since quitting: 44.9  . Smokeless tobacco: Never Used  Substance Use Topics  . Alcohol use: Yes    Alcohol/week: 3.0 standard drinks    Types: 3 Glasses of wine per week    Comment: 3 glasses of wine today   FAMILY HX:  Family History  Problem Relation Age of Onset  . Heart disease Mother        After 42 yrs of age  . Heart attack Mother   . Hyperlipidemia Father   . Hypertension Father   . Heart disease Father        After 80 yrs of age  . Diabetes Father   . Stroke Sister   .  Colon cancer Neg Hx   . Stomach cancer Neg Hx   . Rectal cancer Neg Hx   . Esophageal cancer Neg Hx   . Liver disease Neg Hx   . Kidney disease Neg Hx    ALLERGIES:  Allergies  Allergen Reactions  . Amoxicillin Rash  . Metoprolol Itching    When out in the sun, pt is currently taking.     PERTINENT MEDICATIONS:  Outpatient Encounter Medications as of 03/16/2020  Medication Sig  . ACCU-CHEK AVIVA PLUS test strip   . allopurinol (ZYLOPRIM) 100 MG tablet Take 100 mg by mouth daily.   Marland Kitchen amiodarone (PACERONE) 200 MG tablet Take 2 tablets (400 mg total) by mouth daily.  Marland Kitchen amLODipine (NORVASC) 5 MG tablet Take 5 mg by mouth daily.   . Blood Glucose Monitoring Suppl (ACCU-CHEK AVIVA PLUS) w/Device KIT   . clopidogrel (PLAVIX) 75 MG tablet Take 75 mg by mouth daily.  Marland Kitchen escitalopram (LEXAPRO) 10 MG tablet Take 10 mg by mouth daily.  . furosemide (LASIX) 20 MG tablet Take 2 tablets (40 mg total) by mouth 2 (two) times daily.  Marland Kitchen HYDROcodone-acetaminophen (NORCO/VICODIN) 5-325 MG tablet Take 1 tablet by mouth every 4 (four) hours as needed for moderate pain.   . isosorbide mononitrate (IMDUR) 30 MG 24 hr tablet Take 30 mg by mouth daily.  . lansoprazole (PREVACID) 30 MG capsule Take 30 mg by mouth daily.  Marland Kitchen levothyroxine (SYNTHROID, LEVOTHROID) 88 MCG tablet Take 88 mcg by mouth daily before breakfast.  . metoprolol  succinate (TOPROL-XL) 50 MG 24 hr tablet Take 50 mg by mouth daily.  . multivitamin (RENA-VIT) TABS tablet Take 1 tablet by mouth daily.  . nitroGLYCERIN (NITROSTAT) 0.4 MG SL tablet Place 0.4 mg under the tongue every 5 (five) minutes as needed for chest pain.   . rivaroxaban (XARELTO) 20 MG TABS tablet Take 1 tablet (20 mg total) by mouth daily with supper.  . rosuvastatin (CRESTOR) 40 MG tablet Take 40 mg by mouth daily.  . sacubitril-valsartan (ENTRESTO) 49-51 MG Take 1 tablet by mouth daily.   No facility-administered encounter medications on file as of 03/16/2020.     Jari Favre, DNP, AGPCNP-BC

## 2020-03-30 DIAGNOSIS — M109 Gout, unspecified: Secondary | ICD-10-CM | POA: Diagnosis not present

## 2020-03-30 DIAGNOSIS — E039 Hypothyroidism, unspecified: Secondary | ICD-10-CM | POA: Diagnosis not present

## 2020-03-30 DIAGNOSIS — Z125 Encounter for screening for malignant neoplasm of prostate: Secondary | ICD-10-CM | POA: Diagnosis not present

## 2020-03-30 DIAGNOSIS — R7301 Impaired fasting glucose: Secondary | ICD-10-CM | POA: Diagnosis not present

## 2020-03-30 DIAGNOSIS — E785 Hyperlipidemia, unspecified: Secondary | ICD-10-CM | POA: Diagnosis not present

## 2020-04-02 ENCOUNTER — Other Ambulatory Visit: Payer: Self-pay

## 2020-04-02 MED ORDER — AMIODARONE HCL 200 MG PO TABS
400.0000 mg | ORAL_TABLET | Freq: Every day | ORAL | 2 refills | Status: DC
Start: 2020-04-02 — End: 2020-04-10

## 2020-04-02 NOTE — Telephone Encounter (Signed)
Received a fax from [facility] today wanting to confirm the new prescription sent for Amio 200 mg BID is appropriate for the patient to take while using Escitalopram 10 mg as it may cause QT prolongation.

## 2020-04-03 DIAGNOSIS — L570 Actinic keratosis: Secondary | ICD-10-CM | POA: Diagnosis not present

## 2020-04-06 DIAGNOSIS — I25810 Atherosclerosis of coronary artery bypass graft(s) without angina pectoris: Secondary | ICD-10-CM | POA: Diagnosis not present

## 2020-04-06 DIAGNOSIS — I6529 Occlusion and stenosis of unspecified carotid artery: Secondary | ICD-10-CM | POA: Diagnosis not present

## 2020-04-06 DIAGNOSIS — I739 Peripheral vascular disease, unspecified: Secondary | ICD-10-CM | POA: Diagnosis not present

## 2020-04-06 DIAGNOSIS — N1831 Chronic kidney disease, stage 3a: Secondary | ICD-10-CM | POA: Diagnosis not present

## 2020-04-06 DIAGNOSIS — I340 Nonrheumatic mitral (valve) insufficiency: Secondary | ICD-10-CM | POA: Diagnosis not present

## 2020-04-06 DIAGNOSIS — K921 Melena: Secondary | ICD-10-CM | POA: Diagnosis not present

## 2020-04-06 DIAGNOSIS — Z0000 Encounter for general adult medical examination without abnormal findings: Secondary | ICD-10-CM | POA: Diagnosis not present

## 2020-04-06 DIAGNOSIS — I129 Hypertensive chronic kidney disease with stage 1 through stage 4 chronic kidney disease, or unspecified chronic kidney disease: Secondary | ICD-10-CM | POA: Diagnosis not present

## 2020-04-06 DIAGNOSIS — R82998 Other abnormal findings in urine: Secondary | ICD-10-CM | POA: Diagnosis not present

## 2020-04-06 DIAGNOSIS — E785 Hyperlipidemia, unspecified: Secondary | ICD-10-CM | POA: Diagnosis not present

## 2020-04-06 DIAGNOSIS — I5022 Chronic systolic (congestive) heart failure: Secondary | ICD-10-CM | POA: Diagnosis not present

## 2020-04-09 NOTE — Progress Notes (Signed)
Cardiology Office Note:    Date:  04/10/2020   ID:  Ian Cummings, DOB 1929-04-01, MRN [id number]  PCP:  Marton Redwood, MD  Cardiologist:  Shirlee More, MD    Referring MD: Marton Redwood, MD    ASSESSMENT:    1. PAF (paroxysmal atrial fibrillation) ([place])   2. On amiodarone therapy   3. Chronic anticoagulation   4. Hypertensive heart disease with chronic diastolic congestive heart failure ([place])   5. Coronary artery disease of native artery of native heart with stable angina pectoris ([place])   6. Chronic kidney disease, stage 3a ([place])   7. Nonrheumatic mitral valve regurgitation   8. Renal mass    PLAN:    In order of problems listed above:  1. Stable rate maintaining sinus rhythm continue low-dose amiodarone check liver thyroid studies for toxicity reduce his beta-blocker with bradycardia and continue current anticoagulation 2. Amlodipine discontinued has a long half-life will take a brief break from [place] resumed at lower dose.  He is euvolemic continue his current diuretic checking renal function potassium proBNP 3. Stable CAD approaching 6 months from ACS continue maximum medical therapy beta-blocker oral nitrates high intensity statin and guideline directed therapy for heart failure 4. Recheck renal function usually stable with the initiation of Entresto 5. Stable improved functional due to atrial fibrillation heart failure 6. I will see him back in the office the first week of January and I told his wife would be a good idea to see his urologist in February so we can all make a decision   Next appointment: Scheduled 04/30/2020   Medication Adjustments/Labs and Tests Ordered: Current medicines are reviewed at length with the patient today.  Concerns regarding medicines are outlined above.  Orders Placed This Encounter  Procedures  . EKG 12-Lead   Meds ordered this encounter  Medications  . amiodarone (PACERONE) 200 MG tablet    Sig: Take 1 tablet (200 mg total) by  mouth daily.    Dispense:  90 tablet    Refill:  3  . sacubitril-valsartan (ENTRESTO) 24-26 MG    Sig: Take 1 tablet by mouth 2 (two) times daily.    Dispense:  60 tablet    Refill:  3  . metoprolol succinate (TOPROL XL) 25 MG 24 hr tablet    Sig: Take 1 tablet (25 mg total) by mouth in the morning and at bedtime.    Dispense:  180 tablet    Refill:  3    Chief Complaint  Patient presents with  . Follow-up  . Anticoagulation  . Atrial Fibrillation    On amiodarone  . Congestive Heart Failure  . Coronary Artery Disease    History of Present Illness:    Ian Cummings is a 86 y.o. male with a hx of paroxysmal atrial fibrillation recent successful cardioversion on unfortunately reverted back to atrial fibrillation.  He was last seen and initiated an outpatient amiodarone.  His other problems include CAD heart failure cardiomyopathy mitral regurgitation and peripheral arterial disease.  He has had coronary angiography done and has both graft occlusion native disease and I have had to with the members of the interventional team review his films that do not feel he is a candidate for further revascularization.  In particular he has deteriorated with heart failure in the setting of atrial fibrillation.  After his last visit I spoke to his urologist regarding the mass and options for treatment of his high risk individual.  His ACS presentation to  the hospital was 10/25/2019. He was last seen 03/07/2020 and sinus rhythm on amiodarone with compensated heart failure and initiated on low-dose Entresto.  Compliance with diet, lifestyle and medications: Yes  He has had symptomatic hypotension amlodipine was discontinued appropriately and at home his blood pressure runs in the range of 176-1 10 systolic.  Today he is less than 100 unguinal hold his Entresto to Friday and reintroduce at a lower dose with bradycardia reduce his beta-blocker 50% and continue low-dose amiodarone but the patient kept to 200  mg daily.  Unsure of surgery for his renal mass and if he returns to my office stable tolerating guideline directed therapy I suspect he will decide to have surgical intervention unfortunately he remains a high risk patient with a left compensated heart failure be more than 6 months out from his ACS with stable coronary anatomy in my opinion would be optimized.  In all comes down to his next visit.  proBNP level is remains significantly elevated: Component Ref Range & Units 1 mo ago 2 mo ago  NT-Pro BNP 0 - 486 pg/mL 2,927High  1,654High    Past Medical History:  Diagnosis Date  . AAA (abdominal aortic aneurysm) ([place])    2004, Pt stated not there now  . Anginal pain ([place])   . Anxiety   . Arthritis   . Atherosclerosis of native arteries of the extremities with intermittent claudication 12/04/2011   ABI .76 and .54 in 2012   . Atherosclerotic PVD with intermittent claudication ([place]) 02/20/2014  . BPH (benign prostatic hypertrophy)   . Bradycardia 01/12/2019  . Bradycardia on ECG 11/11/2017  . CAD (coronary artery disease)   . Carotid artery disease ([place])    Right carotid stent 2013  . Carotid artery disease without cerebral infarction [facility])    S/p right carotid stent for asymptomatic disease  2013   . Chronic diastolic (congestive) heart failure ([place]) 09/10/2019  . Chronic kidney disease, stage 3a ([place]) 09/10/2019  . Claudication ([place])   . Colon polyps    adenomatous  . Complex sleep apnea syndrome 11/11/2017  . Conjunctival lesion 05/04/2014   Formatting of this note might be different from the original. left  . Coronary artery disease   . Coronary artery disease involving native coronary artery of native heart with unstable angina pectoris ([place]) 09/06/2012   CABG w LIMA to LAD, SVG to OM1-OM2, SVG to dx, SVG to PD/PL 1995,  Cypher stent x 2 SVG to RCA and Cypher stent to Circ Graft 05/13/07, stent of SVG to circ OM in [place] in July 2013  Cath 4/14 [facility] normal Left main, occluded mid  LAD, occluded RCA SVG, occluded CFX, widely patent OM 1 SVG, occluded RCA SVG, occluded Diag 1 SVG, widely patent LAD LIMA graft;  Redo CABG Dr. Servando Snare  10/18/12 with   . Depression   . Essential hypertension 01/12/2019  . GERD (gastroesophageal reflux disease)   . Gout   . H/O hiatal hernia   . Hyperlipidemia   . Hypertension   . Hypertension, renal disease, stage 1-4 or unspecified chronic kidney disease   . Hypothyroidism   . Malnutrition of moderate degree 08/05/2015  . MI (myocardial infarction) ([place]) 4/14  . Mixed hyperlipidemia   . Neuropathy    Hx: of in B/L toes  . OSA on CPAP 02/17/2018  . PAD (peripheral artery disease) ([place]) 08/03/2015  . PAF (paroxysmal atrial fibrillation) ([place])   . Peripheral vascular disease ([place])    with claudication  .  Pre-diabetes   . RBBB 01/12/2019  . s/p CABG   . Shoulder pain 01/27/2019  . Stroke ([place])   . TIA (transient ischemic attack)    Hx: of  . Unstable angina ([place]) 09/10/2019    Past Surgical History:  Procedure Laterality Date  . CARDIOVERSION N/A 02/22/2020   Procedure: CARDIOVERSION;  Surgeon: [name], MD;  Location: [place];  Service: Cardiovascular;  Laterality: N/A;  . CAROTID STENT INSERTION Right 2013   right at [facility]  . CATARACT EXTRACTION Bilateral    Hx: of both eyes  . COLONOSCOPY     Hx: of  . COLONOSCOPY    . CORONARY ANGIOPLASTY WITH STENT PLACEMENT  2010  . CORONARY ARTERY BYPASS GRAFT  1995  . CORONARY ARTERY BYPASS GRAFT N/A 10/18/2012   Procedure: REDO CORONARY ARTERY BYPASS GRAFTING (CABG);  Surgeon: Grace Isaac, MD;  Location: [place];  Service: Open Heart Surgery;  Laterality: N/A;  off pump times two using endoscopically harvested left saphenous vein  . CORONARY STENT PLACEMENT  06/2016  . ENDARTERECTOMY FEMORAL Right 08/03/2015   Procedure: RIGHT FEMORAL ENDARTERECTOMY WITH PATCH ANGIOPLASTY;  Surgeon: Serafina Mitchell, MD;  Location: [place];  Service: Vascular;  Laterality: Right;   . FEMORAL-POPLITEAL BYPASS GRAFT Right 08/03/2015   Procedure: RIGHT FEMORAL-BELOW KNEE POPLITEAL ARTERY BYPASS GRAFT;  Surgeon: Serafina Mitchell, MD;  Location: [place];  Service: Vascular;  Laterality: Right;  . FOOT SURGERY Left   . FRACTURE SURGERY Left    Hx: of Left heel  . INGUINAL HERNIA REPAIR Bilateral    X 2   . INTRAOPERATIVE TRANSESOPHAGEAL ECHOCARDIOGRAM N/A 10/18/2012   Procedure: INTRAOPERATIVE TRANSESOPHAGEAL ECHOCARDIOGRAM;  Surgeon: Grace Isaac, MD;  Location: [place];  Service: Open Heart Surgery;  Laterality: N/A;  . LEFT HEART CATH AND CORS/GRAFTS ANGIOGRAPHY N/A 10/25/2019   Procedure: LEFT HEART CATH AND CORS/GRAFTS ANGIOGRAPHY;  Surgeon: Leonie Man, MD;  Location: [place] CV LAB;  Service: Cardiovascular;  Laterality: N/A;  . LEFT HEART CATHETERIZATION WITH CORONARY/GRAFT ANGIOGRAM N/A 02/02/2013   Procedure: LEFT HEART CATHETERIZATION WITH Beatrix Fetters;  Surgeon: Jacolyn Reedy, MD;  Location: [facility] CATH LAB;  Service: Cardiovascular;  Laterality: N/A;  . LOWER EXTREMITY ANGIOGRAM Right 06/27/2015   Procedure: Lower Extremity Angiogram;  Surgeon: Serafina Mitchell, MD;  Location: [place] CV LAB;  Service: Cardiovascular;  Laterality: Right;  . MASTOIDECTOMY  1933  . PERIPHERAL VASCULAR CATHETERIZATION N/A 01/23/2015   Procedure: Abdominal Aortogram;  Surgeon: Serafina Mitchell, MD;  Location: [place] CV LAB;  Service: Cardiovascular;  Laterality: N/A;  . PERIPHERAL VASCULAR CATHETERIZATION N/A 06/27/2015   Procedure: Abdominal Aortogram;  Surgeon: Serafina Mitchell, MD;  Location: [place] CV LAB;  Service: Cardiovascular;  Laterality: N/A;  . RETINAL DETACHMENT SURGERY Left    Hx: of left eye  . TEE WITHOUT CARDIOVERSION N/A 02/22/2020   Procedure: TRANSESOPHAGEAL ECHOCARDIOGRAM (TEE);  Surgeon: [name], MD;  Location: [place];  Service: Cardiovascular;  Laterality: N/A;  . TONSILLECTOMY      Current Medications: Current Meds   Medication Sig  . ACCU-CHEK AVIVA PLUS test strip   . allopurinol (ZYLOPRIM) 100 MG tablet Take 100 mg by mouth daily.   . Blood Glucose Monitoring Suppl (ACCU-CHEK AVIVA PLUS) w/Device KIT   . clopidogrel (PLAVIX) 75 MG tablet Take 75 mg by mouth daily.  Marland Kitchen escitalopram (LEXAPRO) 10 MG tablet Take 10 mg by mouth daily.  . furosemide (LASIX) 20 MG tablet  Take 2 tablets (40 mg total) by mouth 2 (two) times daily.  Marland Kitchen HYDROcodone-acetaminophen (NORCO/VICODIN) 5-325 MG tablet Take 1 tablet by mouth every 4 (four) hours as needed for moderate pain.   . isosorbide mononitrate (IMDUR) 30 MG 24 hr tablet Take 30 mg by mouth daily.  . lansoprazole (PREVACID) 30 MG capsule Take 30 mg by mouth daily.  Marland Kitchen levothyroxine (SYNTHROID, LEVOTHROID) 88 MCG tablet Take 88 mcg by mouth daily before breakfast.  . multivitamin (RENA-VIT) TABS tablet Take 1 tablet by mouth daily.  . nitroGLYCERIN (NITROSTAT) 0.4 MG SL tablet Place 0.4 mg under the tongue every 5 (five) minutes as needed for chest pain.   . rivaroxaban (XARELTO) 20 MG TABS tablet Take 1 tablet (20 mg total) by mouth daily with supper.  . rosuvastatin (CRESTOR) 40 MG tablet Take 40 mg by mouth daily.  . [DISCONTINUED] amiodarone (PACERONE) 200 MG tablet Take 2 tablets (400 mg total) by mouth daily. (Patient taking differently: Take 200 mg by mouth daily.)  . [DISCONTINUED] metoprolol succinate (TOPROL-XL) 50 MG 24 hr tablet Take 50 mg by mouth daily.  . [DISCONTINUED] sacubitril-valsartan (ENTRESTO) 49-51 MG Take 1 tablet by mouth daily.     Allergies:   Amoxicillin and Metoprolol   Social History   Socioeconomic History  . Marital status: Married    Spouse name: Not on file  . Number of children: 3  . Years of education: Not on file  . Highest education level: Not on file  Occupational History  . Occupation: retired  Tobacco Use  . Smoking status: Former Smoker    Types: Cigarettes    Quit date: 04/29/1975    Years since quitting: 44.9  .  Smokeless tobacco: Never Used  Vaping Use  . Vaping Use: Never used  Substance and Sexual Activity  . Alcohol use: Yes    Alcohol/week: 3.0 standard drinks    Types: 3 Glasses of wine per week    Comment: 3 glasses of wine today  . Drug use: No  . Sexual activity: Not Currently  Other Topics Concern  . 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 There is no history of Colon cancer, Stomach cancer, Rectal cancer, Esophageal cancer, Liver disease, or Kidney disease. ROS:   Please see the history of present illness.    All other systems reviewed and are negative.  EKGs/Labs/Other Studies Reviewed:    The following studies were reviewed today:  EKG:  EKG ordered today and personally reviewed.  The ekg ordered today demonstrates sinus bradycardia 48 bpm first-degree AV block QT interval is normal  Recent Labs: 09/10/2019: B Natriuretic Peptide 284.4 09/12/2019: Magnesium 1.9 01/23/2020: ALT 21 02/17/2020: BUN 22; Creatinine, Ser 1.07; Hemoglobin 14.1; NT-Pro BNP 2,927; Platelets 123; Potassium 4.1; Sodium 136  Recent Lipid Panel    Component Value Date/Time   CHOL 121 01/23/2020 1040   TRIG 191 (H) 01/23/2020 1040   HDL 30 (L) 01/23/2020 1040   CHOLHDL 4.0 01/23/2020 1040  CHOLHDL 3.2 04/18/2009 0241   VLDL 23 04/18/2009 0241   LDLCALC 59 01/23/2020 1040    Physical Exam:    VS:  BP (!) 92/50   Pulse (!) 48   Ht 5' 11"  (1.803 m)   Wt 156 lb (70.8 kg)   SpO2 99%   BMI 21.76 kg/m     Wt  Readings from Last 3 Encounters:  04/10/20 156 lb (70.8 kg)  03/07/20 153 lb (69.4 kg)  02/27/20 150 lb (68 kg)     GEN: He looks less frail than previous visits well nourished, well developed in no acute distress HEENT: Normal NECK: No JVD; No carotid bruits LYMPHATICS: No lymphadenopathy CARDIAC: Soft S1 RRR, no murmurs, rubs, gallops RESPIRATORY:  Clear to auscultation without rales, wheezing or rhonchi  ABDOMEN: Soft, non-tender, non-distended MUSCULOSKELETAL:  No edema; No deformity  SKIN: Warm and dry NEUROLOGIC:  Alert and oriented x 3 PSYCHIATRIC:  Normal affect    Signed, Shirlee More, MD  04/10/2020 12:03 PM    [place]

## 2020-04-10 ENCOUNTER — Other Ambulatory Visit: Payer: Self-pay

## 2020-04-10 ENCOUNTER — Encounter: Payer: Self-pay | Admitting: Cardiology

## 2020-04-10 ENCOUNTER — Ambulatory Visit (INDEPENDENT_AMBULATORY_CARE_PROVIDER_SITE_OTHER): Payer: Medicare HMO | Admitting: Cardiology

## 2020-04-10 VITALS — BP 92/50 | HR 48 | Ht 71.0 in | Wt 156.0 lb

## 2020-04-10 DIAGNOSIS — Z79899 Other long term (current) drug therapy: Secondary | ICD-10-CM

## 2020-04-10 DIAGNOSIS — I480 Paroxysmal atrial fibrillation: Secondary | ICD-10-CM | POA: Diagnosis not present

## 2020-04-10 DIAGNOSIS — I110 Hypertensive heart disease with heart failure: Secondary | ICD-10-CM | POA: Diagnosis not present

## 2020-04-10 DIAGNOSIS — Z7901 Long term (current) use of anticoagulants: Secondary | ICD-10-CM

## 2020-04-10 DIAGNOSIS — I25118 Atherosclerotic heart disease of native coronary artery with other forms of angina pectoris: Secondary | ICD-10-CM | POA: Diagnosis not present

## 2020-04-10 DIAGNOSIS — I5032 Chronic diastolic (congestive) heart failure: Secondary | ICD-10-CM | POA: Diagnosis not present

## 2020-04-10 DIAGNOSIS — I130 Hypertensive heart and chronic kidney disease with heart failure and stage 1 through stage 4 chronic kidney disease, or unspecified chronic kidney disease: Secondary | ICD-10-CM | POA: Diagnosis not present

## 2020-04-10 DIAGNOSIS — I340 Nonrheumatic mitral (valve) insufficiency: Secondary | ICD-10-CM | POA: Diagnosis not present

## 2020-04-10 DIAGNOSIS — N2889 Other specified disorders of kidney and ureter: Secondary | ICD-10-CM | POA: Diagnosis not present

## 2020-04-10 DIAGNOSIS — N1831 Chronic kidney disease, stage 3a: Secondary | ICD-10-CM

## 2020-04-10 MED ORDER — METOPROLOL SUCCINATE ER 25 MG PO TB24: 25.0000 mg | ORAL_TABLET | Freq: Two times a day (BID) | ORAL | 3 refills | Status: DC

## 2020-04-10 MED ORDER — AMIODARONE HCL 200 MG PO TABS: 200.0000 mg | ORAL_TABLET | Freq: Every day | ORAL | 3 refills | Status: DC

## 2020-04-10 MED ORDER — ENTRESTO 24-26 MG PO TABS: 1.0000 | ORAL_TABLET | Freq: Two times a day (BID) | ORAL | 3 refills | Status: DC

## 2020-04-10 NOTE — Patient Instructions (Signed)
Medication Instructions:  Your physician has recommended you make the following change in your medication: DECREASE: Amiodarone 200 mg take one tablet by mouth daily.  DECREASE: TOPROL XL 25 mg take one tablet by mouth twice daily.  STOP: your Entresto until Friday then START: Entresto 24/26 mg take one tablet by mouth daily for one week. Then take one tablet by mouth twice daily.  *If you need a refill on your cardiac medications before your next appointment, please call your pharmacy*   Lab Work: Your physician recommends that you return for lab work in: TODAY CMP, TSH, T3, T4 If you have labs (blood work) drawn today and your tests are completely normal, you will receive your results only by: Marland Kitchen MyChart Message (if you have MyChart) OR . A paper copy in the mail If you have any lab test that is abnormal or we need to change your treatment, we will call you to review the results.   Testing/Procedures: None   Follow-Up: At [facility], you and your health needs are our priority.  As part of our continuing mission to provide you with exceptional heart care, we have created designated Provider Care Teams.  These Care Teams include your primary Cardiologist (physician) and Advanced Practice Providers (APPs -  Physician Assistants and Nurse Practitioners) who all work together to provide you with the care you need, when you need it.  We recommend signing up for the patient portal called "MyChart".  Sign up information is provided on this After Visit Summary.  MyChart is used to connect with patients for Virtual Visits (Telemedicine).  Patients are able to view lab/test results, encounter notes, upcoming appointments, etc.  Non-urgent messages can be sent to your provider as well.   To learn more about what you can do with MyChart, go to [email or web address].    Your next appointment:   As scheduled  The format for your next appointment:   In Person  Provider:   Shirlee More,  MD   Other Instructions

## 2020-04-11 ENCOUNTER — Telehealth: Payer: Self-pay

## 2020-04-11 LAB — COMPREHENSIVE METABOLIC PANEL
ALT: 17 IU/L (ref 0–44)
AST: 20 IU/L (ref 0–40)
Albumin/Globulin Ratio: 1.2 (ref 1.2–2.2)
Albumin: 4 g/dL (ref 3.6–4.6)
Alkaline Phosphatase: 47 IU/L (ref 44–121)
BUN/Creatinine Ratio: 20 (ref 10–24)
BUN: 33 mg/dL — ABNORMAL HIGH (ref 8–27)
Bilirubin Total: 0.5 mg/dL (ref 0.0–1.2)
CO2: 22 mmol/L (ref 20–29)
Calcium: 9.5 mg/dL (ref 8.6–10.2)
Chloride: 99 mmol/L (ref 96–106)
Creatinine, Ser: 1.63 mg/dL — ABNORMAL HIGH (ref 0.76–1.27)
GFR calc Af Amer: 43 mL/min/{1.73_m2} — ABNORMAL LOW (ref >59)
GFR calc non Af Amer: 37 mL/min/{1.73_m2} — ABNORMAL LOW (ref >59)
Globulin, Total: 3.3 g/dL (ref 1.5–4.5)
Glucose: 159 mg/dL — ABNORMAL HIGH (ref 65–99)
Potassium: 3.7 mmol/L (ref 3.5–5.2)
Sodium: 140 mmol/L (ref 134–144)
Total Protein: 7.3 g/dL (ref 6.0–8.5)

## 2020-04-11 LAB — TSH+T4F+T3FREE
Free T4: 1.6 ng/dL (ref 0.82–1.77)
T3, Free: 2.3 pg/mL (ref 2.0–4.4)
TSH: 3.41 u[IU]/mL (ref 0.450–4.500)

## 2020-04-11 NOTE — Telephone Encounter (Signed)
Left a message to call back with any questions regarding lab results-all normal & no changes necessary per Dr. Bettina Gavia.

## 2020-04-11 NOTE — Telephone Encounter (Signed)
-----   Message from Richardo Priest, MD sent at 04/11/2020  7:27 AM EST ----- Stable results  No further changes we adjusted medications in the office yesterday

## 2020-04-27 NOTE — Progress Notes (Signed)
Cardiology Office Note:    Date:  04/30/2020   ID:  Ian Cummings, DOB [birth year], MRN [id number]  PCP:  Martha Clan, MD  Cardiologist:  Norman Herrlich, MD    Referring MD: Martha Clan, MD    ASSESSMENT:    1. PAF (paroxysmal atrial fibrillation) (HCC)   2. Chronic anticoagulation   3. On amiodarone therapy   4. Hypertensive heart disease with chronic diastolic congestive heart failure (HCC)   5. Nonrheumatic mitral valve regurgitation   6. Chronic kidney disease, stage 3a (HCC)   7. Renal mass    PLAN:    In order of problems listed above:  1. He is maintaining sinus rhythm continue low-dose amiodarone and anticoagulation. 2. Heart failure is compensated but he is symptomatic hypotension, reduce diuretic beta-blocker Entresto and I asked him to get a new blood pressure device at home and record twice daily with his lightheadedness he is at risk for both sinus node dysfunction and ventricular tachycardia and will have a 7-day ZIO monitor applied 3. Consider repeat echocardiogram next visit 4. Recheck renal function proBNP today 5. At this time I would not advise elective urologic surgery.   Next appointment: 1 month   Medication Adjustments/Labs and Tests Ordered: Current medicines are reviewed at length with the patient today.  Concerns regarding medicines are outlined above.  No orders of the defined types were placed in this encounter.  No orders of the defined types were placed in this encounter.   No chief complaint on file.   History of Present Illness:    Ian Cummings is a 86 y.o. male with a hx of CAD with CABG, acute coronary syndrome with coronary angiography showing both graft occlusion and native CAD reviewed by multiple interventional cardiologist not felt to be a candidate for further revascularization, paroxysmal atrial fibrillation maintaining sinus rhythm after cardioversion on amiodarone heart failure mitral regurgitation and peripheral arterial  disease.  Had symptomatic hypotension and his calcium channel blocker was discontinued.  He had bradycardia his beta-blocker dose was creased 50% when he was last seen 04/10/2020.  Unfortunately he has a large renal mass with concern of cancer discussed the case with his urologist and if remains compensated more than 6 months out from ACS will make a decision regarding referral for elective resection.  Size stage III CKD.\  Compliance with diet, lifestyle and medications: Yes  Left heart cath 10/25/2019:  Severe native disease:  ? Known 100 % occluded proximal RCA and proximal LCx as well as LAD after 1st Sept & 2nd Diag 2nd Diag   Patent LIMA-LAD --> 2nd Sept fill retrograde via LIMA-LAD is widely patent) with a 70% eccentric lesion just proximal to 1st Diag,   Patent proximal limb of SVG-OM-2 (occluded second limb OM to OM 3),   Known occlusion of old SVG-rPDA-PL  Likely culprit lesion being 100% thrombotic occlusion of new SVG-RPDA.  Collateral flow fills from SP1 SP2 PDA septals and from distal LCx collaterals to RPL -> provides retrograde filling of the RCA at least back to the distal bend  Preserved LVEF with basal to mid inferior hypokinesis but otherwise EF of 50 to 55%  Normal LVEDP  Coronary Diagrams   Diagnostic Dominance: Right      Unfortunately is not doing well he is very weak debilitated and not completely happy with the quality of his life. He followed up with urology and there is no recommendation for surgery at this time and has a follow-up CT for  his renal mass Wednesday He has lightheadedness and weakness at home he is not checking his blood pressure the device is broken. He stood up the other day and felt very weak and it took about an hour to physically recover afterwards He has hypotension and bradycardia my office today No edema angina shortness of breath or syncope Past Medical History:  Diagnosis Date  . AAA (abdominal aortic aneurysm) ([place])     2004, Pt stated not there now  . Anginal pain ([place])   . Anxiety   . Arthritis   . Atherosclerosis of native arteries of the extremities with intermittent claudication 12/04/2011   ABI .108 and .54 in 2012   . Atherosclerotic PVD with intermittent claudication ([place]) 02/20/2014  . BPH (benign prostatic hypertrophy)   . Bradycardia 01/12/2019  . Bradycardia on ECG 11/11/2017  . CAD (coronary artery disease)   . Carotid artery disease ([place])    Right carotid stent 2013  . Carotid artery disease without cerebral infarction [facility])    S/p right carotid stent for asymptomatic disease  2013   . Chronic diastolic (congestive) heart failure ([place]) 09/10/2019  . Chronic kidney disease, stage 3a ([place]) 09/10/2019  . Claudication ([place])   . Colon polyps    adenomatous  . Complex sleep apnea syndrome 11/11/2017  . Conjunctival lesion 05/04/2014   Formatting of this note might be different from the original. left  . Coronary artery disease   . Coronary artery disease involving native coronary artery of native heart with unstable angina pectoris ([place]) 09/06/2012   CABG w LIMA to LAD, SVG to OM1-OM2, SVG to dx, SVG to PD/PL 1995,  Cypher stent x 2 SVG to RCA and Cypher stent to Circ Graft 05/13/07, stent of SVG to circ OM in [place] in July 2013  Cath 4/14 [facility] normal Left main, occluded mid LAD, occluded RCA SVG, occluded CFX, widely patent OM 1 SVG, occluded RCA SVG, occluded Diag 1 SVG, widely patent LAD LIMA graft;  Redo CABG Dr. Servando Snare  10/18/12 with   . Depression   . Essential hypertension 01/12/2019  . GERD (gastroesophageal reflux disease)   . Gout   . H/O hiatal hernia   . Hyperlipidemia   . Hypertension   . Hypertension, renal disease, stage 1-4 or unspecified chronic kidney disease   . Hypothyroidism   . Malnutrition of moderate degree 08/05/2015  . MI (myocardial infarction) ([place]) 4/14  . Mixed hyperlipidemia   . Neuropathy    Hx: of in B/L toes  . OSA on CPAP 02/17/2018  . PAD (peripheral artery  disease) ([place]) 08/03/2015  . PAF (paroxysmal atrial fibrillation) ([place])   . Peripheral vascular disease ([place])    with claudication  . Pre-diabetes   . RBBB 01/12/2019  . s/p CABG   . Shoulder pain 01/27/2019  . Stroke ([place])   . TIA (transient ischemic attack)    Hx: of  . Unstable angina ([place]) 09/10/2019    Past Surgical History:  Procedure Laterality Date  . CARDIOVERSION N/A 02/22/2020   Procedure: CARDIOVERSION;  Surgeon: [name], MD;  Location: [place];  Service: Cardiovascular;  Laterality: N/A;  . CAROTID STENT INSERTION Right 2013   right at [facility]  . CATARACT EXTRACTION Bilateral    Hx: of both eyes  . COLONOSCOPY     Hx: of  . COLONOSCOPY    . CORONARY ANGIOPLASTY WITH STENT PLACEMENT  2010  . CORONARY ARTERY BYPASS GRAFT  1995  . CORONARY ARTERY BYPASS GRAFT  N/A 10/18/2012   Procedure: REDO CORONARY ARTERY BYPASS GRAFTING (CABG);  Surgeon: Grace Isaac, MD;  Location: [place];  Service: Open Heart Surgery;  Laterality: N/A;  off pump times two using endoscopically harvested left saphenous vein  . CORONARY STENT PLACEMENT  06/2016  . ENDARTERECTOMY FEMORAL Right 08/03/2015   Procedure: RIGHT FEMORAL ENDARTERECTOMY WITH PATCH ANGIOPLASTY;  Surgeon: Serafina Mitchell, MD;  Location: [place];  Service: Vascular;  Laterality: Right;  . FEMORAL-POPLITEAL BYPASS GRAFT Right 08/03/2015   Procedure: RIGHT FEMORAL-BELOW KNEE POPLITEAL ARTERY BYPASS GRAFT;  Surgeon: Serafina Mitchell, MD;  Location: [place];  Service: Vascular;  Laterality: Right;  . FOOT SURGERY Left   . FRACTURE SURGERY Left    Hx: of Left heel  . INGUINAL HERNIA REPAIR Bilateral    X 2   . INTRAOPERATIVE TRANSESOPHAGEAL ECHOCARDIOGRAM N/A 10/18/2012   Procedure: INTRAOPERATIVE TRANSESOPHAGEAL ECHOCARDIOGRAM;  Surgeon: Grace Isaac, MD;  Location: [place];  Service: Open Heart Surgery;  Laterality: N/A;  . LEFT HEART CATH AND CORS/GRAFTS ANGIOGRAPHY N/A 10/25/2019   Procedure: LEFT HEART CATH AND  CORS/GRAFTS ANGIOGRAPHY;  Surgeon: Leonie Man, MD;  Location: [place] CV LAB;  Service: Cardiovascular;  Laterality: N/A;  . LEFT HEART CATHETERIZATION WITH CORONARY/GRAFT ANGIOGRAM N/A 02/02/2013   Procedure: LEFT HEART CATHETERIZATION WITH Beatrix Fetters;  Surgeon: Jacolyn Reedy, MD;  Location: [facility] CATH LAB;  Service: Cardiovascular;  Laterality: N/A;  . LOWER EXTREMITY ANGIOGRAM Right 06/27/2015   Procedure: Lower Extremity Angiogram;  Surgeon: Serafina Mitchell, MD;  Location: [place] CV LAB;  Service: Cardiovascular;  Laterality: Right;  . MASTOIDECTOMY  1933  . PERIPHERAL VASCULAR CATHETERIZATION N/A 01/23/2015   Procedure: Abdominal Aortogram;  Surgeon: Serafina Mitchell, MD;  Location: [place] CV LAB;  Service: Cardiovascular;  Laterality: N/A;  . PERIPHERAL VASCULAR CATHETERIZATION N/A 06/27/2015   Procedure: Abdominal Aortogram;  Surgeon: Serafina Mitchell, MD;  Location: [place] CV LAB;  Service: Cardiovascular;  Laterality: N/A;  . RETINAL DETACHMENT SURGERY Left    Hx: of left eye  . TEE WITHOUT CARDIOVERSION N/A 02/22/2020   Procedure: TRANSESOPHAGEAL ECHOCARDIOGRAM (TEE);  Surgeon: [name], MD;  Location: [place];  Service: Cardiovascular;  Laterality: N/A;  . TONSILLECTOMY      Current Medications: Current Meds  Medication Sig  . ACCU-CHEK AVIVA PLUS test strip   . allopurinol (ZYLOPRIM) 100 MG tablet Take 100 mg by mouth daily.   Marland Kitchen amiodarone (PACERONE) 200 MG tablet Take 1 tablet (200 mg total) by mouth daily.  Marland Kitchen amLODipine (NORVASC) 5 MG tablet Take 5 mg by mouth daily.  . Blood Glucose Monitoring Suppl (ACCU-CHEK AVIVA PLUS) w/Device KIT   . clopidogrel (PLAVIX) 75 MG tablet Take 75 mg by mouth daily.  Marland Kitchen escitalopram (LEXAPRO) 10 MG tablet Take 10 mg by mouth daily.  . furosemide (LASIX) 20 MG tablet Take 2 tablets (40 mg total) by mouth 2 (two) times daily.  Marland Kitchen HYDROcodone-acetaminophen (NORCO/VICODIN) 5-325 MG tablet Take 1 tablet  by mouth every 4 (four) hours as needed for moderate pain.   . isosorbide mononitrate (IMDUR) 30 MG 24 hr tablet Take 30 mg by mouth daily.  . lansoprazole (PREVACID) 30 MG capsule Take 30 mg by mouth daily.  Marland Kitchen levothyroxine (SYNTHROID, LEVOTHROID) 88 MCG tablet Take 88 mcg by mouth daily before breakfast.  . metoprolol succinate (TOPROL XL) 25 MG 24 hr tablet Take 1 tablet (25 mg total) by mouth in the morning and at bedtime.  Marland Kitchen  multivitamin (RENA-VIT) TABS tablet Take 1 tablet by mouth daily.  . nitroGLYCERIN (NITROSTAT) 0.4 MG SL tablet Place 0.4 mg under the tongue every 5 (five) minutes as needed for chest pain.   . rivaroxaban (XARELTO) 20 MG TABS tablet Take 1 tablet (20 mg total) by mouth daily with supper.  . rosuvastatin (CRESTOR) 40 MG tablet Take 40 mg by mouth daily.  . sacubitril-valsartan (ENTRESTO) 24-26 MG Take 1 tablet by mouth 2 (two) times daily.     Allergies:   Amoxicillin and Metoprolol   Social History   Socioeconomic History  . Marital status: Married    Spouse name: Not on file  . Number of children: 3  . Years of education: Not on file  . Highest education level: Not on file  Occupational History  . Occupation: retired  Tobacco Use  . Smoking status: Former Smoker    Types: Cigarettes    Quit date: 04/29/1975    Years since quitting: 45.0  . Smokeless tobacco: Never Used  Vaping Use  . Vaping Use: Never used  Substance and Sexual Activity  . Alcohol use: Yes    Alcohol/week: 3.0 standard drinks    Types: 3 Glasses of wine per week    Comment: 3 glasses of wine today  . Drug use: No  . Sexual activity: Not Currently  Other Topics Concern  . 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 There is no history of Colon cancer, Stomach cancer, Rectal cancer, Esophageal cancer, Liver disease, or Kidney disease. ROS:   Please see the history of present illness.    All other systems reviewed and are negative.  EKGs/Labs/Other Studies Reviewed:    The following studies were reviewed today:  EKG:  EKG ordered today and personally reviewed.  The ekg ordered today demonstrates sinus bradycardia 47 bpm  Recent Labs: 09/10/2019: B Natriuretic Peptide 284.4 09/12/2019: Magnesium 1.9 02/17/2020: Hemoglobin 14.1; NT-Pro BNP 2,927; Platelets 123 04/10/2020: ALT 17; BUN 33; Creatinine, Ser 1.63; Potassium 3.7; Sodium 140; TSH 3.410  Recent Lipid Panel    Component Value Date/Time   CHOL 121 01/23/2020 1040   TRIG 191 (H) 01/23/2020 1040   HDL 30 (L) 01/23/2020 1040   CHOLHDL 4.0 01/23/2020 1040   CHOLHDL 3.2 04/18/2009 0241   VLDL 23 04/18/2009 0241   LDLCALC 59 01/23/2020 1040    Physical Exam:    VS:  BP (!) 102/52   Pulse (!) 46   Ht 5' 11"  (1.803 m)   Wt 158 lb (71.7 kg)   SpO2 99%   BMI 22.04 kg/m     Wt Readings from Last 3 Encounters:  04/30/20 158 lb (71.7 kg)  04/10/20 156 lb (70.8 kg)  03/07/20 153 lb (69.4 kg)  Blood pressure by me right upper extremity 90/60 left upper extremity the 88/56  GEN: He is beginning to look debilitated and chronically ill well nourished, well developed in no acute distress HEENT: Normal NECK: No JVD; No carotid bruits LYMPHATICS:  No lymphadenopathy CARDIAC: RRR, no murmurs, rubs, gallops RESPIRATORY:  Clear to auscultation without rales, wheezing or rhonchi  ABDOMEN: Soft, non-tender, non-distended MUSCULOSKELETAL:  No edema; No deformity  SKIN: Warm and dry NEUROLOGIC:  Alert and oriented x  3 PSYCHIATRIC:  Normal affect    Signed, Shirlee More, MD  04/30/2020 9:52 AM    [place]

## 2020-04-30 ENCOUNTER — Ambulatory Visit (INDEPENDENT_AMBULATORY_CARE_PROVIDER_SITE_OTHER): Payer: Medicare HMO

## 2020-04-30 ENCOUNTER — Other Ambulatory Visit: Payer: Self-pay

## 2020-04-30 ENCOUNTER — Encounter: Payer: Self-pay | Admitting: Cardiology

## 2020-04-30 ENCOUNTER — Ambulatory Visit (INDEPENDENT_AMBULATORY_CARE_PROVIDER_SITE_OTHER): Payer: Medicare HMO | Admitting: Cardiology

## 2020-04-30 VITALS — BP 102/52 | HR 46 | Ht 71.0 in | Wt 158.0 lb

## 2020-04-30 DIAGNOSIS — I480 Paroxysmal atrial fibrillation: Secondary | ICD-10-CM | POA: Diagnosis not present

## 2020-04-30 DIAGNOSIS — I340 Nonrheumatic mitral (valve) insufficiency: Secondary | ICD-10-CM

## 2020-04-30 DIAGNOSIS — I5032 Chronic diastolic (congestive) heart failure: Secondary | ICD-10-CM | POA: Diagnosis not present

## 2020-04-30 DIAGNOSIS — N1831 Chronic kidney disease, stage 3a: Secondary | ICD-10-CM | POA: Diagnosis not present

## 2020-04-30 DIAGNOSIS — Z79899 Other long term (current) drug therapy: Secondary | ICD-10-CM

## 2020-04-30 DIAGNOSIS — Z7901 Long term (current) use of anticoagulants: Secondary | ICD-10-CM

## 2020-04-30 DIAGNOSIS — N2889 Other specified disorders of kidney and ureter: Secondary | ICD-10-CM | POA: Diagnosis not present

## 2020-04-30 DIAGNOSIS — I110 Hypertensive heart disease with heart failure: Secondary | ICD-10-CM

## 2020-04-30 MED ORDER — METOPROLOL SUCCINATE ER 25 MG PO TB24: 12.5000 mg | ORAL_TABLET | Freq: Every day | ORAL | 3 refills | Status: DC

## 2020-04-30 MED ORDER — FUROSEMIDE 20 MG PO TABS: 40.0000 mg | ORAL_TABLET | Freq: Every day | ORAL | 3 refills | Status: DC

## 2020-04-30 MED ORDER — ENTRESTO 24-26 MG PO TABS: 1.0000 | ORAL_TABLET | Freq: Every day | ORAL | 3 refills | Status: DC

## 2020-04-30 NOTE — Patient Instructions (Signed)
Medication Instructions:  Your physician has recommended you make the following change in your medication:  CHANGE: TOPROL XL 12. 5 mg by mouth daily per 0.5 tablet.  DECREASE: Entresto to once daily.  DECREASE: Furosemide to once daily.  *If you need a refill on your cardiac medications before your next appointment, please call your pharmacy*   Lab Work: Your physician recommends that you return for lab work in: TODAY BMP, ProBNP If you have labs (blood work) drawn today and your tests are completely normal, you will receive your results only by: Marland Kitchen MyChart Message (if you have MyChart) OR . A paper copy in the mail If you have any lab test that is abnormal or we need to change your treatment, we will call you to review the results.   Testing/Procedures: A zio monitor was ordered today. It will remain on for 7 days. You will then return monitor and event diary in provided box. It takes 1-2 weeks for report to be downloaded and returned to [place]. We will call you with the results. If monitor falls off or has orange flashing light, please call [employer] for further instructions.      Follow-Up: At [facility], you and your health needs are our priority.  As part of our continuing mission to provide you with exceptional heart care, we have created designated Provider Care Teams.  These Care Teams include your primary Cardiologist (physician) and Advanced Practice Providers (APPs -  Physician Assistants and Nurse Practitioners) who all work together to provide you with the care you need, when you need it.  We recommend signing up for the patient portal called "MyChart".  Sign up information is provided on this After Visit Summary.  MyChart is used to connect with patients for Virtual Visits (Telemedicine).  Patients are able to view lab/test results, encounter notes, upcoming appointments, etc.  Non-urgent messages can be sent to your provider as well.   To learn more about what you can do with  MyChart, go to [email or web address].    Your next appointment:   1 month(s)  The format for your next appointment:   In Person  Provider:   Norman Herrlich, MD   Other Instructions

## 2020-05-01 ENCOUNTER — Telehealth: Payer: Self-pay

## 2020-05-01 LAB — BASIC METABOLIC PANEL
BUN/Creatinine Ratio: 23 (ref 10–24)
BUN: 42 mg/dL — ABNORMAL HIGH (ref 8–27)
CO2: 24 mmol/L (ref 20–29)
Calcium: 9.4 mg/dL (ref 8.6–10.2)
Chloride: 100 mmol/L (ref 96–106)
Creatinine, Ser: 1.86 mg/dL — ABNORMAL HIGH (ref 0.76–1.27)
GFR calc Af Amer: 36 mL/min/{1.73_m2} — ABNORMAL LOW (ref >59)
GFR calc non Af Amer: 31 mL/min/{1.73_m2} — ABNORMAL LOW (ref >59)
Glucose: 145 mg/dL — ABNORMAL HIGH (ref 65–99)
Potassium: 4.1 mmol/L (ref 3.5–5.2)
Sodium: 139 mmol/L (ref 134–144)

## 2020-05-01 LAB — PRO B NATRIURETIC PEPTIDE: NT-Pro BNP: 412 pg/mL (ref 0–486)

## 2020-05-01 LAB — SPECIMEN STATUS REPORT

## 2020-05-01 NOTE — Telephone Encounter (Signed)
-----   Message from Baldo Daub, MD sent at 05/01/2020  7:56 AM EST ----- Stable results, I decreased medications yesterday with hypotension no further changes

## 2020-05-01 NOTE — Telephone Encounter (Signed)
Spoke with patient regarding results and recommendation.  Patient verbalizes understanding and is agreeable to plan of care. Advised patient to call back with any issues or concerns.  

## 2020-05-03 ENCOUNTER — Other Ambulatory Visit (HOSPITAL_COMMUNITY): Payer: Self-pay | Admitting: Urology

## 2020-05-03 ENCOUNTER — Other Ambulatory Visit: Payer: Self-pay | Admitting: Urology

## 2020-05-03 DIAGNOSIS — D49511 Neoplasm of unspecified behavior of right kidney: Secondary | ICD-10-CM

## 2020-05-07 DIAGNOSIS — I480 Paroxysmal atrial fibrillation: Secondary | ICD-10-CM

## 2020-05-10 ENCOUNTER — Other Ambulatory Visit: Payer: Self-pay | Admitting: *Deleted

## 2020-05-10 DIAGNOSIS — I70219 Atherosclerosis of native arteries of extremities with intermittent claudication, unspecified extremity: Secondary | ICD-10-CM

## 2020-05-11 DIAGNOSIS — I480 Paroxysmal atrial fibrillation: Secondary | ICD-10-CM | POA: Diagnosis not present

## 2020-05-14 ENCOUNTER — Telehealth: Payer: Self-pay | Admitting: Cardiology

## 2020-05-14 NOTE — Telephone Encounter (Signed)
     Vivien Rota with [employer] calling to give critical zio result

## 2020-05-14 NOTE — Telephone Encounter (Signed)
Received call from [facility]- stating that they received multiple pauses (27 noted) on a monitor for patient- longest was 6.7 seconds. Report is in the system. Advised I would send a message over to MD to advise to review report. Will send as high priority.  Attempted to contact patient to advise how he was feeling, had to leave a message with call back number.

## 2020-05-14 NOTE — Telephone Encounter (Signed)
I found this in my inbox Saturday and responded set up to see Dr Curt Bears re pacemaker.   When did we receive a call and to whom?

## 2020-05-14 NOTE — Telephone Encounter (Signed)
Ian Cummings from [facility] contacted and they sent it to triage as an urgent matter. I see where you already responded, just had to make you aware of the call in. Sorry for the double messages.   Have a great day!

## 2020-05-16 ENCOUNTER — Ambulatory Visit: Payer: Medicare HMO | Admitting: Cardiology

## 2020-05-16 ENCOUNTER — Encounter: Payer: Self-pay | Admitting: Cardiology

## 2020-05-16 ENCOUNTER — Other Ambulatory Visit: Payer: Self-pay

## 2020-05-16 ENCOUNTER — Emergency Department (HOSPITAL_COMMUNITY)
Admission: EM | Admit: 2020-05-16 | Discharge: 2020-05-16 | Disposition: A | Discharge status: Home / Self Care | Payer: Medicare HMO | Attending: Emergency Medicine | Admitting: Emergency Medicine

## 2020-05-16 ENCOUNTER — Emergency Department (HOSPITAL_COMMUNITY)
Admission: RE | Admit: 2020-05-16 | Discharge: 2020-05-16 | Disposition: A | Discharge status: Home / Self Care | Payer: Medicare HMO | Source: Ambulatory Visit | Attending: Urology | Admitting: Urology

## 2020-05-16 VITALS — BP 118/70 | HR 62 | Ht 71.0 in | Wt 157.0 lb

## 2020-05-16 DIAGNOSIS — R42 Dizziness and giddiness: Secondary | ICD-10-CM | POA: Diagnosis present

## 2020-05-16 DIAGNOSIS — N1831 Chronic kidney disease, stage 3a: Secondary | ICD-10-CM | POA: Insufficient documentation

## 2020-05-16 DIAGNOSIS — I2510 Atherosclerotic heart disease of native coronary artery without angina pectoris: Secondary | ICD-10-CM | POA: Insufficient documentation

## 2020-05-16 DIAGNOSIS — D49511 Neoplasm of unspecified behavior of right kidney: Secondary | ICD-10-CM | POA: Insufficient documentation

## 2020-05-16 DIAGNOSIS — I130 Hypertensive heart and chronic kidney disease with heart failure and stage 1 through stage 4 chronic kidney disease, or unspecified chronic kidney disease: Secondary | ICD-10-CM | POA: Insufficient documentation

## 2020-05-16 DIAGNOSIS — I495 Sick sinus syndrome: Secondary | ICD-10-CM

## 2020-05-16 DIAGNOSIS — R55 Syncope and collapse: Secondary | ICD-10-CM

## 2020-05-16 DIAGNOSIS — I455 Other specified heart block: Secondary | ICD-10-CM | POA: Diagnosis not present

## 2020-05-16 DIAGNOSIS — Z7901 Long term (current) use of anticoagulants: Secondary | ICD-10-CM | POA: Insufficient documentation

## 2020-05-16 DIAGNOSIS — E860 Dehydration: Secondary | ICD-10-CM

## 2020-05-16 DIAGNOSIS — I5032 Chronic diastolic (congestive) heart failure: Secondary | ICD-10-CM | POA: Diagnosis not present

## 2020-05-16 DIAGNOSIS — K862 Cyst of pancreas: Secondary | ICD-10-CM | POA: Diagnosis not present

## 2020-05-16 DIAGNOSIS — Z79899 Other long term (current) drug therapy: Secondary | ICD-10-CM | POA: Diagnosis not present

## 2020-05-16 DIAGNOSIS — R531 Weakness: Secondary | ICD-10-CM | POA: Insufficient documentation

## 2020-05-16 DIAGNOSIS — I9589 Other hypotension: Secondary | ICD-10-CM

## 2020-05-16 DIAGNOSIS — I959 Hypotension, unspecified: Secondary | ICD-10-CM | POA: Diagnosis not present

## 2020-05-16 DIAGNOSIS — E861 Hypovolemia: Secondary | ICD-10-CM

## 2020-05-16 DIAGNOSIS — I480 Paroxysmal atrial fibrillation: Secondary | ICD-10-CM

## 2020-05-16 DIAGNOSIS — I4819 Other persistent atrial fibrillation: Secondary | ICD-10-CM | POA: Insufficient documentation

## 2020-05-16 DIAGNOSIS — N2889 Other specified disorders of kidney and ureter: Secondary | ICD-10-CM | POA: Diagnosis not present

## 2020-05-16 DIAGNOSIS — N261 Atrophy of kidney (terminal): Secondary | ICD-10-CM | POA: Diagnosis not present

## 2020-05-16 DIAGNOSIS — I25810 Atherosclerosis of coronary artery bypass graft(s) without angina pectoris: Secondary | ICD-10-CM | POA: Insufficient documentation

## 2020-05-16 DIAGNOSIS — K449 Diaphragmatic hernia without obstruction or gangrene: Secondary | ICD-10-CM | POA: Diagnosis not present

## 2020-05-16 DIAGNOSIS — E039 Hypothyroidism, unspecified: Secondary | ICD-10-CM | POA: Diagnosis not present

## 2020-05-16 DIAGNOSIS — Z87891 Personal history of nicotine dependence: Secondary | ICD-10-CM | POA: Diagnosis not present

## 2020-05-16 LAB — COMPREHENSIVE METABOLIC PANEL
ALT: 24 U/L (ref 0–44)
AST: 27 U/L (ref 15–41)
Albumin: 3.9 g/dL (ref 3.5–5.0)
Alkaline Phosphatase: 39 U/L (ref 38–126)
Anion gap: 15 (ref 5–15)
BUN: 34 mg/dL — ABNORMAL HIGH (ref 8–23)
CO2: 23 mmol/L (ref 22–32)
Calcium: 9.4 mg/dL (ref 8.9–10.3)
Chloride: 102 mmol/L (ref 98–111)
Creatinine, Ser: 1.75 mg/dL — ABNORMAL HIGH (ref 0.61–1.24)
GFR, Estimated: 37 mL/min — ABNORMAL LOW (ref >60)
Glucose, Bld: 141 mg/dL — ABNORMAL HIGH (ref 70–99)
Potassium: 3.9 mmol/L (ref 3.5–5.1)
Sodium: 140 mmol/L (ref 135–145)
Total Bilirubin: 0.7 mg/dL (ref 0.3–1.2)
Total Protein: 7.9 g/dL (ref 6.5–8.1)

## 2020-05-16 LAB — PROTIME-INR
INR: 2.2 — ABNORMAL HIGH (ref 0.8–1.2)
Prothrombin Time: 24 seconds — ABNORMAL HIGH (ref 11.4–15.2)

## 2020-05-16 LAB — CBC WITH DIFFERENTIAL/PLATELET
Abs Immature Granulocytes: 0.05 10*3/uL (ref 0.00–0.07)
Basophils Absolute: 0.1 10*3/uL (ref 0.0–0.1)
Basophils Relative: 1 %
Eosinophils Absolute: 0.3 10*3/uL (ref 0.0–0.5)
Eosinophils Relative: 3 %
HCT: 35.4 % — ABNORMAL LOW (ref 39.0–52.0)
Hemoglobin: 12 g/dL — ABNORMAL LOW (ref 13.0–17.0)
Immature Granulocytes: 1 %
Lymphocytes Relative: 15 %
Lymphs Abs: 1.2 10*3/uL (ref 0.7–4.0)
MCH: 32 pg (ref 26.0–34.0)
MCHC: 33.9 g/dL (ref 30.0–36.0)
MCV: 94.4 fL (ref 80.0–100.0)
Monocytes Absolute: 0.8 10*3/uL (ref 0.1–1.0)
Monocytes Relative: 10 %
Neutro Abs: 6 10*3/uL (ref 1.7–7.7)
Neutrophils Relative %: 70 %
Platelets: 167 10*3/uL (ref 150–400)
RBC: 3.75 MIL/uL — ABNORMAL LOW (ref 4.22–5.81)
RDW: 14.4 % (ref 11.5–15.5)
WBC: 8.4 10*3/uL (ref 4.0–10.5)
nRBC: 0 % (ref 0.0–0.2)

## 2020-05-16 LAB — URINALYSIS, ROUTINE W REFLEX MICROSCOPIC
Bilirubin Urine: NEGATIVE
Glucose, UA: NEGATIVE mg/dL
Hgb urine dipstick: NEGATIVE
Ketones, ur: NEGATIVE mg/dL
Leukocytes,Ua: NEGATIVE
Nitrite: NEGATIVE
Protein, ur: NEGATIVE mg/dL
Specific Gravity, Urine: 1.006 (ref 1.005–1.030)
pH: 7 (ref 5.0–8.0)

## 2020-05-16 LAB — TROPONIN I (HIGH SENSITIVITY)
Troponin I (High Sensitivity): 14 ng/L (ref <18)
Troponin I (High Sensitivity): 16 ng/L (ref <18)

## 2020-05-16 LAB — CBG MONITORING, ED: Glucose-Capillary: 113 mg/dL — ABNORMAL HIGH (ref 70–99)

## 2020-05-16 MED ORDER — ACETAMINOPHEN 500 MG PO TABS
1000.0000 mg | ORAL_TABLET | Freq: Once | ORAL | Status: AC
  Administered 2020-05-16: 1000 mg via ORAL
  Filled 2020-05-16: qty 2

## 2020-05-16 MED ORDER — GADOBUTROL 1 MMOL/ML IV SOLN
7.0000 mL | Freq: Once | INTRAVENOUS | Status: AC | PRN
  Administered 2020-05-16: 7 mL via INTRAVENOUS

## 2020-05-16 MED ORDER — LACTATED RINGERS IV BOLUS
500.0000 mL | Freq: Once | INTRAVENOUS | Status: AC
  Administered 2020-05-16: 500 mL via INTRAVENOUS

## 2020-05-16 NOTE — ED Provider Notes (Signed)
[facility] DEPT Provider Note   CSN: [id number] Arrival date & time: 05/16/20  1027     History Chief Complaint  Patient presents with  . Dizziness  . Weakness  . Bradycardia    Ian Cummings is a 87 y.o. male.  HPI Patient was at an MRI having an abdominal MRI study to further evaluate a renal mass.  He had been n.p.o. since midnight the night before.  Patient reports he felt all right when he started the study.  After the study was completed, and he was getting up, he got very lightheaded and had to sit back down again. He denies he felt short of breath or had chest pain.  His blood pressure was identified to be 77/43.  Rapid response team called to MRI.  Patient was given 2 bottles of water to drink and reassess.  Blood pressures had come up to 105/75.  However, patient continued to feel lightheaded with standing and blood pressures remained soft after several rechecks.  Patient was transferred to the emergency department.  Patient reports he is not feeling dizzy at rest.  This was occurring when he was trying to get up again.  Currently does not have symptoms while lying flat.  He is not experiencing any palpitations, shortness of breath, chest pain.  Patient does have a follow-up appointment today with cardiology for findings on a Holter monitor and possible pacemaker placement.  Patient reports that his cardiologist did just have him discontinue amiodarone and metoprolol 4 days ago.    Past Medical History:  Diagnosis Date  . AAA (abdominal aortic aneurysm) ([place])    2004, Pt stated not there now  . Anginal pain ([place])   . Anxiety   . Arthritis   . Atherosclerosis of native arteries of the extremities with intermittent claudication 12/04/2011   ABI .67 and .54 in 2012   . Atherosclerotic PVD with intermittent claudication ([place]) 02/20/2014  . BPH (benign prostatic hypertrophy)   . Bradycardia 01/12/2019  . Bradycardia on ECG 11/11/2017  . CAD (coronary  artery disease)   . Carotid artery disease ([place])    Right carotid stent 2013  . Carotid artery disease without cerebral infarction [facility])    S/p right carotid stent for asymptomatic disease  2013   . Chronic diastolic (congestive) heart failure ([place]) 09/10/2019  . Chronic kidney disease, stage 3a ([place]) 09/10/2019  . Claudication ([place])   . Colon polyps    adenomatous  . Complex sleep apnea syndrome 11/11/2017  . Conjunctival lesion 05/04/2014   Formatting of this note might be different from the original. left  . Coronary artery disease   . Coronary artery disease involving native coronary artery of native heart with unstable angina pectoris ([place]) 09/06/2012   CABG w LIMA to LAD, SVG to OM1-OM2, SVG to dx, SVG to PD/PL 1995,  Cypher stent x 2 SVG to RCA and Cypher stent to Circ Graft 05/13/07, stent of SVG to circ OM in [place] in July 2013  Cath 4/14 [facility] normal Left main, occluded mid LAD, occluded RCA SVG, occluded CFX, widely patent OM 1 SVG, occluded RCA SVG, occluded Diag 1 SVG, widely patent LAD LIMA graft;  Redo CABG Dr. Servando Snare  10/18/12 with   . Depression   . Essential hypertension 01/12/2019  . GERD (gastroesophageal reflux disease)   . Gout   . H/O hiatal hernia   . Hyperlipidemia   . Hypertension   . Hypertension, renal disease, stage 1-4 or unspecified  chronic kidney disease   . Hypothyroidism   . Malnutrition of moderate degree 08/05/2015  . MI (myocardial infarction) ([place]) 4/14  . Mixed hyperlipidemia   . Neuropathy    Hx: of in B/L toes  . OSA on CPAP 02/17/2018  . PAD (peripheral artery disease) ([place]) 08/03/2015  . PAF (paroxysmal atrial fibrillation) ([place])   . Peripheral vascular disease ([place])    with claudication  . Pre-diabetes   . RBBB 01/12/2019  . s/p CABG   . Shoulder pain 01/27/2019  . Stroke ([place])   . TIA (transient ischemic attack)    Hx: of  . Unstable angina ([place]) 09/10/2019    Patient Active Problem List   Diagnosis Date Noted  . Stroke ([place])   . PAF  (paroxysmal atrial fibrillation) ([place])   . Persistent atrial fibrillation ([place])   . Neck pain 02/01/2020  . TIA (transient ischemic attack)   . Pre-diabetes   . Peripheral vascular disease ([place])   . Neuropathy   . Hyperlipidemia   . H/O hiatal hernia   . GERD (gastroesophageal reflux disease)   . Depression   . Coronary artery disease   . Colon polyps   . Claudication ([place])   . Carotid artery disease ([place])   . CAD (coronary artery disease)   . Arthritis   . Anxiety   . Anginal pain ([place])   . AAA (abdominal aortic aneurysm) ([place])   . Chronic kidney disease, stage 3a ([place]) 09/10/2019  . Chronic diastolic (congestive) heart failure ([place]) 09/10/2019  . Unstable angina ([place]) 09/10/2019  . Shoulder pain 01/27/2019  . RBBB 01/12/2019  . Bradycardia 01/12/2019  . Essential hypertension 01/12/2019  . Hypertension   . OSA on CPAP 02/17/2018  . Complex sleep apnea syndrome 11/11/2017  . Bradycardia on ECG 11/11/2017  . Malnutrition of moderate degree 08/05/2015  . PAD (peripheral artery disease) ([place]) 08/03/2015  . Conjunctival lesion 05/04/2014  . Atherosclerotic PVD with intermittent claudication ([place]) 02/20/2014  . Hypothyroidism   . Coronary artery disease involving native coronary artery of native heart with unstable angina pectoris ([place]) 09/06/2012  . s/p CABG   . Mixed hyperlipidemia   . Hypertension, renal disease, stage 1-4 or unspecified chronic kidney disease   . Gout   . MI (myocardial infarction) ([place]) 07/2012  . 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 Carotid artery disease without cerebral infarction [facility])     Past Surgical History:  Procedure Laterality Date  . CARDIOVERSION N/A 02/22/2020   Procedure: CARDIOVERSION;  Surgeon: [name], MD;  Location: [facility];  Service: Cardiovascular;  Laterality: N/A;  . CAROTID STENT INSERTION Right 2013   right [facility]  . CATARACT  EXTRACTION Bilateral    Hx: of both eyes  . COLONOSCOPY     Hx: of  . COLONOSCOPY    . CORONARY ANGIOPLASTY WITH STENT PLACEMENT  2010  . CORONARY ARTERY BYPASS GRAFT  1995  . CORONARY ARTERY BYPASS GRAFT N/A 10/18/2012   Procedure: REDO CORONARY ARTERY BYPASS GRAFTING (CABG);  Surgeon: Grace Isaac, MD;  Location: [facility];  Service: Open Heart Surgery;  Laterality: N/A;  off pump times two using endoscopically harvested left saphenous vein  . CORONARY STENT PLACEMENT  06/2016  . ENDARTERECTOMY FEMORAL Right 08/03/2015   Procedure: RIGHT FEMORAL ENDARTERECTOMY WITH PATCH ANGIOPLASTY;  Surgeon: Serafina Mitchell, MD;  Location: [facility];  Service: Vascular;  Laterality: Right;  . FEMORAL-POPLITEAL BYPASS GRAFT Right 08/03/2015  Procedure: RIGHT FEMORAL-BELOW KNEE POPLITEAL ARTERY BYPASS GRAFT;  Surgeon: Serafina Mitchell, MD;  Location: [facility];  Service: Vascular;  Laterality: Right;  . FOOT SURGERY Left   . FRACTURE SURGERY Left    Hx: of Left heel  . INGUINAL HERNIA REPAIR Bilateral    X 2   . INTRAOPERATIVE TRANSESOPHAGEAL ECHOCARDIOGRAM N/A 10/18/2012   Procedure: INTRAOPERATIVE TRANSESOPHAGEAL ECHOCARDIOGRAM;  Surgeon: Grace Isaac, MD;  Location: [facility];  Service: Open Heart Surgery;  Laterality: N/A;  . LEFT HEART CATH AND CORS/GRAFTS ANGIOGRAPHY N/A 10/25/2019   Procedure: LEFT HEART CATH AND CORS/GRAFTS ANGIOGRAPHY;  Surgeon: Leonie Man, MD;  Location: [facility] CV LAB;  Service: Cardiovascular;  Laterality: N/A;  . LEFT HEART CATHETERIZATION WITH CORONARY/GRAFT ANGIOGRAM N/A 02/02/2013   Procedure: LEFT HEART CATHETERIZATION WITH Beatrix Fetters;  Surgeon: Jacolyn Reedy, MD;  Location: [facility] CATH LAB;  Service: Cardiovascular;  Laterality: N/A;  . LOWER EXTREMITY ANGIOGRAM Right 06/27/2015   Procedure: Lower Extremity Angiogram;  Surgeon: Serafina Mitchell, MD;  Location: [facility] CV LAB;  Service: Cardiovascular;  Laterality: Right;  . MASTOIDECTOMY  1933  . PERIPHERAL  VASCULAR CATHETERIZATION N/A 01/23/2015   Procedure: Abdominal Aortogram;  Surgeon: Serafina Mitchell, MD;  Location: [facility] CV LAB;  Service: Cardiovascular;  Laterality: N/A;  . PERIPHERAL VASCULAR CATHETERIZATION N/A 06/27/2015   Procedure: Abdominal Aortogram;  Surgeon: Serafina Mitchell, MD;  Location: [facility] CV LAB;  Service: Cardiovascular;  Laterality: N/A;  . RETINAL DETACHMENT SURGERY Left    Hx: of left eye  . TEE WITHOUT CARDIOVERSION N/A 02/22/2020   Procedure: TRANSESOPHAGEAL ECHOCARDIOGRAM (TEE);  Surgeon: [name], MD;  Location: [facility] ENDOSCOPY;  Service: Cardiovascular;  Laterality: N/A;  . TONSILLECTOMY         Family History  Problem Relation Age of Onset  . Heart disease Mother        After 44 yrs of age  . Heart attack Mother   . Hyperlipidemia Father   . Hypertension Father   . Heart disease Father        After 78 yrs of age  . Diabetes Father   . Stroke Sister   . Colon cancer Neg Hx   . Stomach cancer Neg Hx   . Rectal cancer Neg Hx   . Esophageal cancer Neg Hx   . Liver disease Neg Hx   . Kidney disease Neg Hx     Social History   Tobacco Use  . Smoking status: Former Smoker    Types: Cigarettes    Quit date: 04/29/1975    Years since quitting: 45.0  . Smokeless tobacco: Never Used  Vaping Use  . Vaping Use: Never used  Substance Use Topics  . Alcohol use: Yes    Alcohol/week: 3.0 standard drinks    Types: 3 Glasses of wine per week    Comment: 3 glasses of wine today  . Drug use: No    Home Medications Prior to Admission medications   Medication Sig Start Date End Date Taking? Authorizing Provider  allopurinol (ZYLOPRIM) 100 MG tablet Take 100 mg by mouth daily.    Yes [provider]  amLODipine (NORVASC) 5 MG tablet Take 5 mg by mouth daily. 05/12/07  Yes [provider]  clopidogrel (PLAVIX) 75 MG tablet Take 75 mg by mouth daily.   Yes [provider]  escitalopram (LEXAPRO) 10 MG tablet Take 10 mg  by mouth daily. 07/17/18  Yes [provider]  furosemide (LASIX) 20 MG tablet Take 2 tablets (40 mg total) by mouth daily. 04/30/20  Yes Richardo Priest, MD  HYDROcodone-acetaminophen (NORCO/VICODIN) 5-325 MG tablet Take 1 tablet by mouth every 4 (four) hours as needed for moderate pain.  01/30/20  Yes [provider]  isosorbide mononitrate (IMDUR) 30 MG 24 hr tablet Take 30 mg by mouth daily.   Yes [provider]  lansoprazole (PREVACID) 30 MG capsule Take 30 mg by mouth daily. 08/16/19  Yes [provider]  levothyroxine (SYNTHROID, LEVOTHROID) 88 MCG tablet Take 88 mcg by mouth daily before breakfast.   Yes [provider]  melatonin 5 MG TABS Take 5 mg by mouth at bedtime.   Yes [provider]  multivitamin (RENA-VIT) TABS tablet Take 1 tablet by mouth daily.   Yes [provider]  nitroGLYCERIN (NITROSTAT) 0.4 MG SL tablet Place 0.4 mg under the tongue every 5 (five) minutes as needed for chest pain.  09/12/19  Yes [provider]  rivaroxaban (XARELTO) 20 MG TABS tablet Take 1 tablet (20 mg total) by mouth daily with supper. 03/15/20  Yes Richardo Priest, MD  rosuvastatin (CRESTOR) 40 MG tablet Take 40 mg by mouth daily.   Yes [provider]  sacubitril-valsartan (ENTRESTO) 24-26 MG Take 1 tablet by mouth daily. 04/30/20  Yes Richardo Priest, MD  ACCU-CHEK AVIVA PLUS test strip  11/17/18   [provider]  amiodarone (PACERONE) 200 MG tablet Take 1 tablet (200 mg total) by mouth daily. Patient not taking: Reported on 05/16/2020 04/10/20   Richardo Priest, MD  Blood Glucose Monitoring Suppl (ACCU-CHEK AVIVA PLUS) w/Device KIT  11/17/18   [provider]  metoprolol succinate (TOPROL XL) 25 MG 24 hr tablet Take 0.5 tablets (12.5 mg total) by mouth daily. Patient not taking: Reported on 05/16/2020 04/30/20   Richardo Priest, MD    Allergies    Amoxicillin and Metoprolol  Review of Systems   Review of  Systems 10 systems reviewed negative except as per HPI Physical Exam Updated Vital Signs BP (!) 145/82   Pulse 81   Temp 97.6 F (36.4 C) (Oral)   Resp 16   Ht 5' 11"  (1.803 m)   Wt 69.4 kg   SpO2 99%   BMI 21.34 kg/m   Physical Exam Constitutional:      Appearance: He is well-developed and well-nourished.  HENT:     Head: Normocephalic and atraumatic.     Mouth/Throat:     Pharynx: Oropharynx is clear.  Eyes:     Extraocular Movements: Extraocular movements intact and EOM normal.  Cardiovascular:     Rate and Rhythm: Normal rate and regular rhythm.     Pulses: Intact distal pulses.     Heart sounds: Normal heart sounds.  Pulmonary:     Effort: Pulmonary effort is normal.     Breath sounds: Normal breath sounds.  Abdominal:     General: Bowel sounds are normal. There is no distension.     Palpations: Abdomen is soft.     Tenderness: There is no abdominal tenderness.  Musculoskeletal:        General: No edema. Normal range of motion.     Cervical back: Neck supple.  Skin:    General: Skin is warm, dry and intact.  Neurological:     General: No focal deficit present.     Mental Status: He is alert and oriented to person, place, and time.  GCS: GCS eye subscore is 4. GCS verbal subscore is 5. GCS motor subscore is 6.     Coordination: Coordination normal.     Deep Tendon Reflexes: Strength normal.  Psychiatric:        Mood and Affect: Mood and affect normal.     ED Results / Procedures / Treatments   Labs (all labs ordered are listed, but only abnormal results are displayed) Labs Reviewed  COMPREHENSIVE METABOLIC PANEL - Abnormal; Notable for the following components:      Result Value   Glucose, Bld 141 (*)    BUN 34 (*)    Creatinine, Ser 1.75 (*)    GFR, Estimated 37 (*)    All other components within normal limits  CBC WITH DIFFERENTIAL/PLATELET - Abnormal; Notable for the following components:   RBC 3.75 (*)    Hemoglobin 12.0 (*)    HCT 35.4 (*)     All other components within normal limits  PROTIME-INR - Abnormal; Notable for the following components:   Prothrombin Time 24.0 (*)    INR 2.2 (*)    All other components within normal limits  CBG MONITORING, ED - Abnormal; Notable for the following components:   Glucose-Capillary 113 (*)    All other components within normal limits  URINALYSIS, ROUTINE W REFLEX MICROSCOPIC  TROPONIN I (HIGH SENSITIVITY)  TROPONIN I (HIGH SENSITIVITY)    EKG EKG Interpretation  Date/Time:  Wednesday May 16 2020 10:34:36 EST Ventricular Rate:  56 PR Interval:    QRS Duration: 129 QT Interval:  542 QTC Calculation: 524 R Axis:   -6 Text Interpretation: Sinus rhythm IVCD, consider atypical RBBB no sig change from previous Confirmed by Charlesetta Shanks [phone]) on 05/16/2020 12:49:43 PM   Radiology MR ABDOMEN WWO CONTRAST  Result Date: 05/16/2020 CLINICAL DATA:  87 year old male with history of right renal mass noted on prior CT examination. Follow-up study. EXAM: MRI ABDOMEN WITHOUT AND WITH CONTRAST TECHNIQUE: Multiplanar multisequence MR imaging of the abdomen was performed both before and after the administration of intravenous contrast. CONTRAST:  71mL GADAVIST GADOBUTROL 1 MMOL/ML IV SOLN COMPARISON:  No prior abdominal MRI. CT the abdomen and pelvis 03/28/2020. FINDINGS: Lower chest: Susceptibility artifact in the lower sternum from median sternotomy wires. Small to moderate-sized hiatal hernia. Hepatobiliary: Subcentimeter T1 hypointense, T2 hyperintense, nonenhancing lesion in the superior aspect of the right lobe of the liver between segments 7 and 8, compatible with a small simple cyst. No other suspicious hepatic lesions. No intra or extrahepatic biliary ductal dilatation. Tiny filling defects within the gallbladder, compatible with small gallstones. Gallbladder is not distended. Gallbladder wall is normal in thickness. No pericholecystic fluid or inflammatory changes. Pancreas: There are  multiple T1 hypointense, T2 hyperintense lesions in the pancreas, most of which have an elongated appearance, strongly favored to represent widespread side branch ectasia in the pancreatic ducts. Largest of these measures 1.0 x 0.6 cm in the pancreatic head (axial image 21 of series 21). No solid pancreatic mass identified. No main pancreatic ductal dilatation. No peripancreatic fluid collections or inflammatory changes. Spleen:  Unremarkable. Adrenals/Urinary Tract: Large heterogeneously enhancing mass in the upper pole of the right kidney measuring 5.1 x 6.3 x 5.5 cm (axial image 42 of series 23 and coronal image 33 of series 20). This mass appears encapsulated within Gerota's fascia and is separated from the right renal vein which is widely patent. Neovascularity is noted in the upper right retroperitoneum associated with this mass. Atrophy in the interpolar and lower  pole region of the right kidney. Multiple T1 hypointense, T2 hyperintense, nonenhancing lesions in both kidneys, compatible with tiny simple cysts, largest of which is in the upper pole the left kidney measuring 1.8 cm in diameter. No hydroureteronephrosis in the visualized portions of the abdomen. Stomach/Bowel: Visualized portions are unremarkable. Vascular/Lymphatic: Aortic atherosclerosis, without evidence of aneurysm in the abdominal vasculature. Circumaortic left renal vein (normal anatomical variant) incidentally noted. No lymphadenopathy noted in the abdomen Other: No significant volume of ascites noted in the visualized portions of the peritoneal cavity. Musculoskeletal: No aggressive appearing osseous lesions are noted in the visualized portions of the skeleton. IMPRESSION: 1. 5.1 x 6.3 x 5.5 cm heterogeneously enhancing mass in the upper pole the right kidney which is highly concerning for primary renal cell carcinoma. This mass is encapsulated within Gerota's fascia, is separate from the right renal vein which is widely patent, and is  not associated with definite lymphadenopathy or signs of metastatic disease in the abdomen at this time. 2. Multiple cystic appearing lesions associated with the pancreas, favored to represent multifocal side branch ectasia or other benign lesions. Repeat abdominal MRI with and without IV gadolinium with MRCP is recommended in 2 years to ensure continued stability. This recommendation follows ACR consensus guidelines: Management of Incidental Pancreatic Cysts: A White Paper of the ACR Incidental Findings Committee. J Am Coll Radiol 2017;14:911-923. 3. Aortic atherosclerosis. 4. Small to moderate-sized hiatal hernia. Electronically Signed   By: Trudie Reed M.D.   On: 05/16/2020 10:30    Procedures Procedures (including critical care time)  Medications Ordered in ED Medications  lactated ringers bolus 500 mL (0 mLs Intravenous Stopped 05/16/20 1155)  acetaminophen (TYLENOL) tablet 1,000 mg (1,000 mg Oral Given 05/16/20 1328)    ED Course  I have reviewed the triage vital signs and the nursing notes.  Pertinent labs & imaging results that were available during my care of the patient were reviewed by me and considered in my medical decision making (see chart for details).    MDM Rules/Calculators/A&P                         Patient's blood pressures are normotensive and elevating since arrival.  Patient did have some persistent orthostatic hypotension with standing.  However he does not endorse having been symptomatic.  He reports after being in the MRI he started to get pain in his left shoulder which is already arthritic and painful.  He reports that has become more aggravating and now he feels like he has pain that radiates up to his neck.  First EKG does not show any acute changes.  Troponin is normal.  Low suspicion for cardiac ischemic event during MRI.  History and description consistent with orthostatic hypotension after fasting and lying supine for prolonged period of time.  Patient  denies he had symptoms when he went into the MRI.  Patient has been orally rehydrated and had a fluid bolus of 500 mL LR.  Once sitting up and moving about again, blood pressures have stabilized.  Is at baseline with ambulation.  Patient stable for discharge and go directly to his EP appointment.  His EKGs here show sinus rhythm.  Rates have been from 50s to 70s.  Patient just recently discontinued metoprolol and amiodarone due to pauses identified on Zio patch.  No pauses or significant conduction blocks apparent on EKGs or monitoring in the emergency department.  Patient is going directly to EP for a scheduled  appointment. Final Clinical Impression(s) / ED Diagnoses Final diagnoses:  Dehydration  Hypotension due to hypovolemia    Rx / DC Orders ED Discharge Orders    None       Charlesetta Shanks, MD 05/16/20 1347

## 2020-05-16 NOTE — ED Notes (Signed)
BP dropped while standing, took twice 2nd reading is the highest.

## 2020-05-16 NOTE — Discharge Instructions (Addendum)
1.  Go directly to your appointment with Dr. Quentin Ore. 2.  You appear to have gotten dehydrated and in addition, dropped your blood pressure from lying for prolonged period an MRI.  At this time, you appear significantly improved.  Return to the emergency department if you develop any concerning symptoms.

## 2020-05-16 NOTE — Progress Notes (Signed)
Electrophysiology Office Note:    Date:  05/16/2020   ID:  Ian Cummings, DOB [birth year], MRN [id number]  PCP:  Marton Redwood, MD  [facility] Cardiologist:  Shirlee More, MD  [place] Electrophysiologist:  None   Referring MD: Marton Redwood, MD   Chief Complaint: Sinus pause, syncope  History of Present Illness:    Ian Cummings is a 87 y.o. male who presents for an evaluation of sinus pause and syncope at the request of Dr. Bettina Gavia. Their medical history includes AAA, coronary artery disease, CABG in 1995, hypertension, hypothyroidism, paroxysmal atrial fibrillation.  He last saw Dr. Bettina Gavia April 30, 2020.   Patient presented to the emergency department today after syncopal episode at MRI.  He was having an abdominal MRI today to evaluate a renal mass.  He was NPO.  At the beginning of the study, he felt okay.  After the test was over a move the bed out and he started to feel very lightheaded.  He started to feel lightheaded before he stood up.  He was hypotensive with systolic blood pressures in the 70s.  He was given water to drink and his blood pressure improved to 100 mmHg.  He was sent to the emergency department.       Past Medical History:  Diagnosis Date  . AAA (abdominal aortic aneurysm) ([place])    2004, Pt stated not there now  . Anginal pain ([place])   . Anxiety   . Arthritis   . Atherosclerosis of native arteries of the extremities with intermittent claudication 12/04/2011   ABI .41 and .54 in 2012   . Atherosclerotic PVD with intermittent claudication ([place]) 02/20/2014  . BPH (benign prostatic hypertrophy)   . Bradycardia 01/12/2019  . Bradycardia on ECG 11/11/2017  . CAD (coronary artery disease)   . Carotid artery disease ([place])    Right carotid stent 2013  . Carotid artery disease without cerebral infarction [facility])    S/p right carotid stent for asymptomatic disease  2013   . Chronic diastolic (congestive) heart failure ([place]) 09/10/2019  . Chronic kidney  disease, stage 3a ([place]) 09/10/2019  . Claudication ([place])   . Colon polyps    adenomatous  . Complex sleep apnea syndrome 11/11/2017  . Conjunctival lesion 05/04/2014   Formatting of this note might be different from the original. left  . Coronary artery disease   . Coronary artery disease involving native coronary artery of native heart with unstable angina pectoris ([place]) 09/06/2012   CABG w LIMA to LAD, SVG to OM1-OM2, SVG to dx, SVG to PD/PL 1995,  Cypher stent x 2 SVG to RCA and Cypher stent to Circ Graft 05/13/07, stent of SVG to circ OM in [place] in July 2013  Cath 4/14 [facility] normal Left main, occluded mid LAD, occluded RCA SVG, occluded CFX, widely patent OM 1 SVG, occluded RCA SVG, occluded Diag 1 SVG, widely patent LAD LIMA graft;  Redo CABG Dr. Servando Snare  10/18/12 with   . Depression   . Essential hypertension 01/12/2019  . GERD (gastroesophageal reflux disease)   . Gout   . H/O hiatal hernia   . Hyperlipidemia   . Hypertension   . Hypertension, renal disease, stage 1-4 or unspecified chronic kidney disease   . Hypothyroidism   . Malnutrition of moderate degree 08/05/2015  . MI (myocardial infarction) ([place]) 4/14  . Mixed hyperlipidemia   . Neuropathy    Hx: of in B/L toes  . OSA on CPAP 02/17/2018  .  PAD (peripheral artery disease) (H[facility]) 08/03/2015  . PAF (paroxysmal atrial fibrillation) ([facility])   . Peripheral vascular disease (H[facility])    with claudication  . Pre-diabetes   . RBBB 01/12/2019  . s/p CABG   . Shoulder pain 01/27/2019  . Stroke (H[facility])   . TIA (transient ischemic attack)    Hx: of  . Unstable angina (HCC) 09/10/2019    Past Surgical History:  Procedure Laterality Date  . CARDIOVERSION N/A 02/22/2020   Procedure: CARDIOVERSION;  Surgeon: Hyndman, Tiffany, MD;  [facility]: [facility] ENDOS[facility];  Service: Cardiovascular;  Laterality: N/A;  . CAROTID STENT INSERTION Right 2013   right at [facility]  . CATARACT EXTRACTION Bilateral    Hx: of both eyes  . COLONOSCOPY     Hx:  of  . COLONOSCOPY    . CORONARY ANGIOPLASTY WITH STENT PLACEMENT  2010  . CORONARY ARTERY BYPASS GRAFT  1995  . CORONARY ARTERY BYPASS GRAFT N/A 10/18/2012   Procedure: REDO CORONARY ARTERY BYPASS GRAFTING (CABG);  Surgeon: Edward B Gerhardt, MD;  Location: [facility] OR;  Service: Open Heart Surgery;  Laterality: N/A;  off pump times two using endoscopically harvested left saphenous vein  . CORONARY STENT PLACEMENT  06/2016  . ENDARTERECTOMY FEMORAL Right 08/03/2015   Procedure: RIGHT FEMORAL ENDARTERECTOMY WITH PATCH ANGIOPLASTY;  Surgeon: Vance W Brabham, MD;  Location: [facility] OR;  Service: Vascular;  Laterality: Right;  . FEMORAL-POPLITEAL BYPASS GRAFT Right 08/03/2015   Procedure: RIGHT FEMORAL-BELOW KNEE POPLITEAL ARTERY BYPASS GRAFT;  Surgeon: Vance W Brabham, MD;  Location: [facility] OR;  Service: Vascular;  Laterality: Right;  . FOOT SURGERY Left   . FRACTURE SURGERY Left    Hx: of Left heel  . INGUINAL HERNIA REPAIR Bilateral    X 2   . INTRAOPERATIVE TRANSESOPHAGEAL ECHOCARDIOGRAM N/A 10/18/2012   Procedure: INTRAOPERATIVE TRANSESOPHAGEAL ECHOCARDIOGRAM;  Surgeon: Edward B Gerhardt, MD;  Location: [facility] OR;  Service: Open Heart Surgery;  Laterality: N/A;  . LEFT HEART CATH AND CORS/GRAFTS ANGIOGRAPHY N/A 10/25/2019   Procedure: LEFT HEART CATH AND CORS/GRAFTS ANGIOGRAPHY;  Surgeon: Harding, David W, MD;  Location: [facility] INVASIVE CV LAB;  Service: Cardiovascular;  Laterality: N/A;  . LEFT HEART CATHETERIZATION WITH CORONARY/GRAFT ANGIOGRAM N/A 02/02/2013   Procedure: LEFT HEART CATHETERIZATION WITH CORONARY/GRAFT ANGIOGRAM;  Surgeon: William S Tilley, MD;  Location: [facility] CATH LAB;  Service: Cardiovascular;  Laterality: N/A;  . LOWER EXTREMITY ANGIOGRAM Right 06/27/2015   Procedure: Lower Extremity Angiogram;  Surgeon: Vance W Brabham, MD;  [facility];  Service: Cardiovascular;  Laterality: Right;  . MASTOIDECTOMY  1933  . PERIPHERAL VASCULAR CATHETERIZATION N/A 01/23/2015   Procedure: Abdominal  Aortogram;  Surgeon: Vance W Brabham, MD;  [facility];  Service: Cardiovascular;  Laterality: N/A;  . PERIPHERAL VASCULAR CATHETERIZATION N/A 06/27/2015   Procedure: Abdominal Aortogram;  Surgeon: Vance W Brabham, MD;  [facility];  Service: Cardiovascular;  Laterality: N/A;  . RETINAL DETACHMENT SURGERY Left    Hx: of left eye  . TEE WITHOUT CARDIOVERSION N/A 02/22/2020   Procedure: TRANSESOPHAGEAL ECHOCARDIOGRAM (TEE);  Surgeon: [place], Tiffany, MD;  [facility];  Service: Cardiovascular;  Laterality: N/A;  . TONSILLECTOMY      Current Medications: Current Meds  Medication Sig  . ACCU-CHEK AVIVA PLUS test strip   . allopurinol (ZYLOPRIM) 100 MG tablet Take 100 mg by mouth daily.   . amLODipine (NORVASC) 5 MG tablet Take 5 mg by mouth daily.  . Blood Glucose Monitoring   Suppl (ACCU-CHEK AVIVA PLUS) w/Device KIT   . clopidogrel (PLAVIX) 75 MG tablet Take 75 mg by mouth daily.  Marland Kitchen escitalopram (LEXAPRO) 10 MG tablet Take 10 mg by mouth daily.  . furosemide (LASIX) 20 MG tablet Take 40 mg by mouth 2 (two) times daily.  Marland Kitchen HYDROcodone-acetaminophen (NORCO/VICODIN) 5-325 MG tablet Take 1 tablet by mouth every 4 (four) hours as needed for moderate pain.   . isosorbide mononitrate (IMDUR) 30 MG 24 hr tablet Take 30 mg by mouth daily.  . lansoprazole (PREVACID) 30 MG capsule Take 30 mg by mouth daily.  Marland Kitchen levothyroxine (SYNTHROID, LEVOTHROID) 88 MCG tablet Take 88 mcg by mouth daily before breakfast.  . melatonin 5 MG TABS Take 5 mg by mouth at bedtime.  . multivitamin (RENA-VIT) TABS tablet Take 1 tablet by mouth daily.  . nitroGLYCERIN (NITROSTAT) 0.4 MG SL tablet Place 0.4 mg under the tongue every 5 (five) minutes as needed for chest pain.   . rivaroxaban (XARELTO) 20 MG TABS tablet Take 1 tablet (20 mg total) by mouth daily with supper.  . rosuvastatin (CRESTOR) 40 MG tablet Take 40 mg by mouth daily.  . sacubitril-valsartan (ENTRESTO) 24-26 MG  Take 1 tablet by mouth daily.     Allergies:   Amoxicillin and Metoprolol   Social History   Socioeconomic History  . Marital status: Married    Spouse name: Not on file  . Number of children: 3  . Years of education: Not on file  . Highest education level: Not on file  Occupational History  . Occupation: retired  Tobacco Use  . Smoking status: Former Smoker    Types: Cigarettes    Quit date: 04/29/1975    Years since quitting: 45.0  . Smokeless tobacco: Never Used  Vaping Use  . Vaping Use: Never used  Substance and Sexual Activity  . Alcohol use: Yes    Alcohol/week: 3.0 standard drinks    Types: 3 Glasses of wine per week    Comment: 3 glasses of wine today  . Drug use: No  . Sexual activity: Not Currently  Other Topics Concern  . 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 There is no history of Colon cancer, Stomach cancer, Rectal cancer, Esophageal cancer, Liver disease, or Kidney disease.  ROS:   Please see the history of present illness.    All other systems reviewed and are negative.  EKGs/Labs/Other Studies Reviewed:    The following studies were reviewed today:  May 12, 2020 ZIO personally reviewed  Rare ventricular ectopy.  No sustained ventricular arrhythmias. 27 pauses, longest lasting 6.7 seconds.  Pauses occurred during awake hours. Heart rate 32-79, average 52  Sep 10, 2019 echo personally reviewed Left ventricular function low normal, 50% Right ventricular  function normal  inferobasal hypokinesis  October 25, 2019 left heart cath  Prox RCA to Dist RCA lesion is 100% stenosed.  Prox Cx to Dist Cx lesion is 100% stenosed with 100% stenosed side branch in 2nd  Mrg.  Prox LAD lesion is 75% stenosed. 2nd Diag lesion is 70% stenosed.  Mid LAD-1 lesion is 65% stenosed with 60% stenosed side branch in 1st Sept.  Mid LAD-2 lesion is 99% stenosed / subtotally occluded (no competetive / retrograde flow via LIMA).  LIMA-LAD graft was visualized by angiography and is normal in caliber. The graft exhibits no disease. Dist LAD lesion is 50% stenosed.  Seq SVG- OM2-OM3; (known occluded 2nd Limb) was visualized by angiography and is large.  Origin lesion is 40% stenosed. Prox Graft STENT is 10% stenosed. Mid Graft to Dist Graft STENT is 20% stenosed.  Original SVG-PDA-PL not injected due to known occlusion is large with extensive stents.  ReDo SVG-dRCA graft was visualized by angiography and is large. Prox Graft to Mid Graft lesion is 100% stenosed.  There is mild to moderate left ventricular systolic dysfunction.  The left ventricular ejection fraction is 45-50% by visual estimate.  There is no aortic valve stenosis.    Recent Labs: 09/10/2019: B Natriuretic Peptide 284.4 09/12/2019: Magnesium 1.9 04/10/2020: TSH 3.410 04/30/2020: NT-Pro BNP 412 05/16/2020: ALT 24; BUN 34; Creatinine, Ser 1.75; Hemoglobin 12.0; Platelets 167; Potassium 3.9; Sodium 140  Recent Lipid Panel    Component Value Date/Time   CHOL 121 01/23/2020 1040   TRIG 191 (H) 01/23/2020 1040   HDL 30 (L) 01/23/2020 1040   CHOLHDL 4.0 01/23/2020 1040   CHOLHDL 3.2 04/18/2009 0241   VLDL 23 04/18/2009 0241   LDLCALC 59 01/23/2020 1040    Physical Exam:    VS:  BP 118/70   Pulse 62   Ht 5' 11"  (1.803 m)   Wt 157 lb (71.2 kg)   SpO2 96%   BMI 21.90 kg/m     Wt Readings from Last 3 Encounters:  05/16/20 157 lb (71.2 kg)  05/16/20 153 lb (69.4 kg)  04/30/20 158 lb (71.7  kg)     GEN:  Well nourished, well developed in no acute distress HEENT: Normal NECK: No JVD; No carotid bruits LYMPHATICS: No lymphadenopathy CARDIAC: RRR, no murmurs, rubs, gallops RESPIRATORY:  Clear to auscultation without rales, wheezing or rhonchi  ABDOMEN: Soft, non-tender, non-distended MUSCULOSKELETAL:  No edema; No deformity  SKIN: Warm and dry NEUROLOGIC:  Alert and oriented x 3 PSYCHIATRIC:  Normal affect   ASSESSMENT:    1. Syncope and collapse   2. Sinus node dysfunction (HCC)   3. Sinus arrest   4. Paroxysmal atrial fibrillation (HCC)    PLAN:    In order of problems listed above:  1. Sinus node dysfunction, sinus arrest, syncope Patient with evidence of sinus node dysfunction and sinus arrest on recent Holter monitor.  These findings are in the setting of recurrent syncopal episodes and near syncope.  His most recent episode of near syncope was this morning after an MRI.  His symptoms started while he was still laying flat.  There is obviously a component of sinus node dysfunction contributing to his presyncope and syncope although there may be a superimposed component of vasovagal syncope.  Given the findings discussed above, would recommend implanting a dual-chamber permanent pacemaker.  We will plan to implant a Biotronik device with closed-loop stimulation capabilities.  I discussed the procedure in detail with the patient and wife who is with him today in clinic.  The risks, recovery time were discussed in detail with them and they wish to proceed.  He will continue taking his Plavix in the perioperative period given his severe coronary artery disease.  We will plan to hold the Xarelto for 2 days prior to the procedure and will restart it 5 days after the procedure.  Risks, benefits, alternatives to PPM implantation were discussed in detail with the patient today. The patient understands that the risks include but are not limited to bleeding, infection, pneumothorax,  perforation, tamponade, vascular damage, renal failure, MI, stroke, death, and lead dislodgement and wishes to proceed.  We will therefore schedule device implantation at the next available time.  2. paroxysmal atrial fibrillation In sinus rhythm today.  Hold Xarelto as above.  3. severe coronary artery disease Not a candidate for revascularization.  Continue Plavix given severe coronary obstruction as detailed above.  Medication Adjustments/Labs and Tests Ordered: Current medicines are reviewed at length with the patient today.  Concerns regarding medicines are outlined above.  No orders of the defined types were placed in this encounter.  No orders of the defined types were placed in this encounter.    Signed, Lars Mage, MD, [facility]  05/16/2020 3:36 PM    Electrophysiology [facility]

## 2020-05-16 NOTE — Patient Instructions (Signed)
Medication Instructions:  Your physician recommends that you continue on your current medications as directed. Please refer to the Current Medication list given to you today.  Labwork: None ordered.  Testing/Procedures: None ordered.  Follow-Up:  SEE INSTRUCTION LETTER  Any Other Special Instructions Will Be Listed Below (If Applicable).  If you need a refill on your cardiac medications before your next appointment, please call your pharmacy.    Pacemaker Implantation, Adult Pacemaker implantation is a procedure to place a pacemaker inside the chest. A pacemaker is a small computer that sends electrical signals to the heart and helps the heart beat normally. A pacemaker also stores information about heart rhythms. You may need pacemaker implantation if you have:  A slow heartbeat (bradycardia).  Loss of consciousness that happens repeatedly (syncope) or repeated episodes of dizziness or light-headedness because of an irregular heart rate.  Shortness of breath (dyspnea) due to heart problems. The pacemaker usually attaches to your heart through a wire called a lead. One or two leads may be needed. There are different types of pacemakers:  Transvenous pacemaker. This type is placed under the skin or muscle of your upper chest area. The lead goes through a vein in the chest area to reach the inside of the heart.  Epicardial pacemaker. This type is placed under the skin or muscle of your chest or abdomen. The lead goes through your chest to the outside of the heart. Tell a health care provider about:  Any allergies you have.  All medicines you are taking, including vitamins, herbs, eye drops, creams, and over-the-counter medicines.  Any problems you or family members have had with anesthetic medicines.  Any blood or bone disorders you have.  Any surgeries you have had.  Any medical conditions you have.  Whether you are pregnant or may be pregnant. What are the  risks? Generally, this is a safe procedure. However, problems may occur, including:  Infection.  Bleeding.  Failure of the pacemaker or the lead.  Collapse of a lung or bleeding into a lung.  Blood clot inside a blood vessel with a lead.  Damage to the heart.  Infection inside the heart (endocarditis).  Allergic reactions to medicines. What happens before the procedure? Staying hydrated Follow instructions from your health care provider about hydration, which may include:  Up to 2 hours before the procedure - you may continue to drink clear liquids, such as water, clear fruit juice, black coffee, and plain tea.   Eating and drinking restrictions Follow instructions from your health care provider about eating and drinking, which may include:  8 hours before the procedure - stop eating heavy meals or foods, such as meat, fried foods, or fatty foods.  6 hours before the procedure - stop eating light meals or foods, such as toast or cereal.  6 hours before the procedure - stop drinking milk or drinks that contain milk.  2 hours before the procedure - stop drinking clear liquids. Medicines Ask your health care provider about:  Changing or stopping your regular medicines. This is especially important if you are taking diabetes medicines or blood thinners.  Taking medicines such as aspirin and ibuprofen. These medicines can thin your blood. Do not take these medicines unless your health care provider tells you to take them.  Taking over-the-counter medicines, vitamins, herbs, and supplements. Tests You may have:  A heart evaluation. This may include: ? An electrocardiogram (ECG). This involves placing patches on your skin to check your heart rhythm. ?   A chest X-ray. ? An echocardiogram. This is a test that uses sound waves (ultrasound) to produce an image of the heart. ? A cardiac rhythm monitor. This is used to record your heart rhythm and any events for a longer period of  time.  Blood tests.  Genetic testing. General instructions  Do not use any products that contain nicotine or tobacco for at least 4 weeks before the procedure. These products include cigarettes, e-cigarettes, and chewing tobacco. If you need help quitting, ask your health care provider.  Ask your health care provider: ? How your surgery site will be marked. ? What steps will be taken to help prevent infection. These steps may include:  Removing hair at the surgery site.  Washing skin with a germ-killing soap.  Receiving antibiotic medicine.  Plan to have someone take you home from the hospital or clinic.  If you will be going home right after the procedure, plan to have someone with you for 24 hours. What happens during the procedure?  An IV will be inserted into one of your veins.  You will be given one or more of the following: ? A medicine to help you relax (sedative). ? A medicine to numb the area (local anesthetic). ? A medicine to make you fall asleep (general anesthetic).  The next steps vary depending on the type of pacemaker you will be getting. ? If you are getting a transvenous pacemaker:  An incision will be made in your upper chest.  A pocket will be made for the pacemaker. It may be placed under the skin or between layers of muscle.  The lead will be inserted into a blood vessel that goes to the heart.  While X-rays are taken by an imaging machine (fluoroscopy), the lead will be advanced through the vein to the inside of your heart.  The other end of the lead will be tunneled under the skin and attached to the pacemaker. ? If you are getting an epicardial pacemaker:  An incision will be made near your ribs or breastbone (sternum) for the lead.  The lead will be attached to the outside of your heart.  Another incision will be made in your chest or upper abdomen to create a pocket for the pacemaker.  The free end of the lead will be tunneled under the  skin and attached to the pacemaker.  The transvenous or epicardial pacemaker will be tested. Imaging studies may be done to check the lead position.  The incisions will be closed with stitches (sutures), adhesive strips, or skin glue.  Bandages (dressings) will be placed over the incisions. The procedure may vary among health care providers and hospitals. What happens after the procedure?  Your blood pressure, heart rate, breathing rate, and blood oxygen level will be monitored until you leave the hospital or clinic.  You may be given antibiotics.  You will be given pain medicine.  An ECG and chest X-rays will be done.  You may need to wear a continuous type of ECG (Holter monitor) to check your heart rhythm.  Your health care provider will program the pacemaker.  If you were given a sedative during the procedure, it can affect you for several hours. Do not drive or operate machinery until your health care provider says that it is safe.  You will be given a pacemaker identification card. This card lists the implant date, device model, and manufacturer of your pacemaker. Summary  A pacemaker is a small computer that sends   electrical signals to the heart and helps the heart beat normally.  There are different types of pacemakers. A pacemaker may be placed under the skin or muscle of your chest or abdomen.  Follow instructions from your health care provider about eating and drinking and about taking medicines before the procedure. This information is not intended to replace advice given to you by your health care provider. Make sure you discuss any questions you have with your health care provider. Document Revised: 03/16/2019 Document Reviewed: 03/16/2019 Elsevier Patient Education  2021 Elsevier Inc.  

## 2020-05-16 NOTE — ED Notes (Signed)
Patient's BP 139/66 while sitting in bed, upon stand dropped to 112/58, patient stated her felt no more dizzy than was usual for him.  Patient was steady on his feet so we walked out to the nursing station and back to the bed.  BP after walking but while still standing was 145/82.  Pfeiffer, EDP made aware.

## 2020-05-16 NOTE — H&P (View-Only) (Signed)
Electrophysiology Office Note:    Date:  05/16/2020   ID:  Ian Cummings, DOB Jan 26, 1929, MRN [id number]  PCP:  Marton Redwood, MD  [facility] Cardiologist:  Shirlee More, MD  [place] Electrophysiologist:  None   Referring MD: Marton Redwood, MD   Chief Complaint: Sinus pause, syncope  History of Present Illness:    Ian Cummings is a 87 y.o. male who presents for an evaluation of sinus pause and syncope at the request of Dr. Bettina Cummings. Their medical history includes AAA, coronary artery disease, CABG in 1995, hypertension, hypothyroidism, paroxysmal atrial fibrillation.  He last saw Dr. Bettina Cummings April 30, 2020.   Patient presented to the emergency department today after syncopal episode at MRI.  He was having an abdominal MRI today to evaluate a renal mass.  He was NPO.  At the beginning of the study, he felt okay.  After the test was over a move the bed out and he started to feel very lightheaded.  He started to feel lightheaded before he stood up.  He was hypotensive with systolic blood pressures in the 70s.  He was given water to drink and his blood pressure improved to 100 mmHg.  He was sent to the emergency department.       Past Medical History:  Diagnosis Date  . AAA (abdominal aortic aneurysm) ([place])    2004, Pt stated not there now  . Anginal pain ([place])   . Anxiety   . Arthritis   . Atherosclerosis of native arteries of the extremities with intermittent claudication 12/04/2011   ABI .65 and .54 in 2012   . Atherosclerotic PVD with intermittent claudication ([place]) 02/20/2014  . BPH (benign prostatic hypertrophy)   . Bradycardia 01/12/2019  . Bradycardia on ECG 11/11/2017  . CAD (coronary artery disease)   . Carotid artery disease ([place])    Right carotid stent 2013  . Carotid artery disease without cerebral infarction [facility])    S/p right carotid stent for asymptomatic disease  2013   . Chronic diastolic (congestive) heart failure ([place]) 09/10/2019  . Chronic kidney  disease, stage 3a ([place]) 09/10/2019  . Claudication ([place])   . Colon polyps    adenomatous  . Complex sleep apnea syndrome 11/11/2017  . Conjunctival lesion 05/04/2014   Formatting of this note might be different from the original. left  . Coronary artery disease   . Coronary artery disease involving native coronary artery of native heart with unstable angina pectoris ([place]) 09/06/2012   CABG w LIMA to LAD, SVG to OM1-OM2, SVG to dx, SVG to PD/PL 1995,  Cypher stent x 2 SVG to RCA and Cypher stent to Circ Graft 05/13/07, stent of SVG to circ OM in [place] in July 2013  Cath 4/14 [facility] normal Left main, occluded mid LAD, occluded RCA SVG, occluded CFX, widely patent OM 1 SVG, occluded RCA SVG, occluded Diag 1 SVG, widely patent LAD LIMA graft;  Redo CABG Dr. Servando Snare  10/18/12 with   . Depression   . Essential hypertension 01/12/2019  . GERD (gastroesophageal reflux disease)   . Gout   . H/O hiatal hernia   . Hyperlipidemia   . Hypertension   . Hypertension, renal disease, stage 1-4 or unspecified chronic kidney disease   . Hypothyroidism   . Malnutrition of moderate degree 08/05/2015  . MI (myocardial infarction) ([place]) 4/14  . Mixed hyperlipidemia   . Neuropathy    Hx: of in B/L toes  . OSA on CPAP 02/17/2018  .  PAD (peripheral artery disease) ([place]) 08/03/2015  . PAF (paroxysmal atrial fibrillation) ([place])   . Peripheral vascular disease ([place])    with claudication  . Pre-diabetes   . RBBB 01/12/2019  . s/p CABG   . Shoulder pain 01/27/2019  . Stroke ([place])   . TIA (transient ischemic attack)    Hx: of  . Unstable angina ([place]) 09/10/2019    Past Surgical History:  Procedure Laterality Date  . CARDIOVERSION N/A 02/22/2020   Procedure: CARDIOVERSION;  Surgeon: [name], MD;  Location: [place];  Service: Cardiovascular;  Laterality: N/A;  . CAROTID STENT INSERTION Right 2013   right at [facility]  . CATARACT EXTRACTION Bilateral    Hx: of both eyes  . COLONOSCOPY     Hx:  of  . COLONOSCOPY    . CORONARY ANGIOPLASTY WITH STENT PLACEMENT  2010  . CORONARY ARTERY BYPASS GRAFT  1995  . CORONARY ARTERY BYPASS GRAFT N/A 10/18/2012   Procedure: REDO CORONARY ARTERY BYPASS GRAFTING (CABG);  Surgeon: Grace Isaac, MD;  Location: [place];  Service: Open Heart Surgery;  Laterality: N/A;  off pump times two using endoscopically harvested left saphenous vein  . CORONARY STENT PLACEMENT  06/2016  . ENDARTERECTOMY FEMORAL Right 08/03/2015   Procedure: RIGHT FEMORAL ENDARTERECTOMY WITH PATCH ANGIOPLASTY;  Surgeon: Serafina Mitchell, MD;  Location: [place];  Service: Vascular;  Laterality: Right;  . FEMORAL-POPLITEAL BYPASS GRAFT Right 08/03/2015   Procedure: RIGHT FEMORAL-BELOW KNEE POPLITEAL ARTERY BYPASS GRAFT;  Surgeon: Serafina Mitchell, MD;  Location: [place];  Service: Vascular;  Laterality: Right;  . FOOT SURGERY Left   . FRACTURE SURGERY Left    Hx: of Left heel  . INGUINAL HERNIA REPAIR Bilateral    X 2   . INTRAOPERATIVE TRANSESOPHAGEAL ECHOCARDIOGRAM N/A 10/18/2012   Procedure: INTRAOPERATIVE TRANSESOPHAGEAL ECHOCARDIOGRAM;  Surgeon: Grace Isaac, MD;  Location: [place];  Service: Open Heart Surgery;  Laterality: N/A;  . LEFT HEART CATH AND CORS/GRAFTS ANGIOGRAPHY N/A 10/25/2019   Procedure: LEFT HEART CATH AND CORS/GRAFTS ANGIOGRAPHY;  Surgeon: Leonie Man, MD;  Location: [place] CV LAB;  Service: Cardiovascular;  Laterality: N/A;  . LEFT HEART CATHETERIZATION WITH CORONARY/GRAFT ANGIOGRAM N/A 02/02/2013   Procedure: LEFT HEART CATHETERIZATION WITH Beatrix Fetters;  Surgeon: Jacolyn Reedy, MD;  Location: [facility] CATH LAB;  Service: Cardiovascular;  Laterality: N/A;  . LOWER EXTREMITY ANGIOGRAM Right 06/27/2015   Procedure: Lower Extremity Angiogram;  Surgeon: Serafina Mitchell, MD;  Location: [place] CV LAB;  Service: Cardiovascular;  Laterality: Right;  . MASTOIDECTOMY  1933  . PERIPHERAL VASCULAR CATHETERIZATION N/A 01/23/2015   Procedure: Abdominal  Aortogram;  Surgeon: Serafina Mitchell, MD;  Location: [place] CV LAB;  Service: Cardiovascular;  Laterality: N/A;  . PERIPHERAL VASCULAR CATHETERIZATION N/A 06/27/2015   Procedure: Abdominal Aortogram;  Surgeon: Serafina Mitchell, MD;  Location: [place] CV LAB;  Service: Cardiovascular;  Laterality: N/A;  . RETINAL DETACHMENT SURGERY Left    Hx: of left eye  . TEE WITHOUT CARDIOVERSION N/A 02/22/2020   Procedure: TRANSESOPHAGEAL ECHOCARDIOGRAM (TEE);  Surgeon: [name], MD;  Location: [place];  Service: Cardiovascular;  Laterality: N/A;  . TONSILLECTOMY      Current Medications: Current Meds  Medication Sig  . ACCU-CHEK AVIVA PLUS test strip   . allopurinol (ZYLOPRIM) 100 MG tablet Take 100 mg by mouth daily.   Marland Kitchen amLODipine (NORVASC) 5 MG tablet Take 5 mg by mouth daily.  . Blood Glucose Monitoring  Suppl (ACCU-CHEK AVIVA PLUS) w/Device KIT   . clopidogrel (PLAVIX) 75 MG tablet Take 75 mg by mouth daily.  Marland Kitchen escitalopram (LEXAPRO) 10 MG tablet Take 10 mg by mouth daily.  . furosemide (LASIX) 20 MG tablet Take 40 mg by mouth 2 (two) times daily.  Marland Kitchen HYDROcodone-acetaminophen (NORCO/VICODIN) 5-325 MG tablet Take 1 tablet by mouth every 4 (four) hours as needed for moderate pain.   . isosorbide mononitrate (IMDUR) 30 MG 24 hr tablet Take 30 mg by mouth daily.  . lansoprazole (PREVACID) 30 MG capsule Take 30 mg by mouth daily.  Marland Kitchen levothyroxine (SYNTHROID, LEVOTHROID) 88 MCG tablet Take 88 mcg by mouth daily before breakfast.  . melatonin 5 MG TABS Take 5 mg by mouth at bedtime.  . multivitamin (RENA-VIT) TABS tablet Take 1 tablet by mouth daily.  . nitroGLYCERIN (NITROSTAT) 0.4 MG SL tablet Place 0.4 mg under the tongue every 5 (five) minutes as needed for chest pain.   . rivaroxaban (XARELTO) 20 MG TABS tablet Take 1 tablet (20 mg total) by mouth daily with supper.  . rosuvastatin (CRESTOR) 40 MG tablet Take 40 mg by mouth daily.  . sacubitril-valsartan (ENTRESTO) 24-26 MG  Take 1 tablet by mouth daily.     Allergies:   Amoxicillin and Metoprolol   Social History   Socioeconomic History  . Marital status: Married    Spouse name: Not on file  . Number of children: 3  . Years of education: Not on file  . Highest education level: Not on file  Occupational History  . Occupation: retired  Tobacco Use  . Smoking status: Former Smoker    Types: Cigarettes    Quit date: 04/29/1975    Years since quitting: 45.0  . Smokeless tobacco: Never Used  Vaping Use  . Vaping Use: Never used  Substance and Sexual Activity  . Alcohol use: Yes    Alcohol/week: 3.0 standard drinks    Types: 3 Glasses of wine per week    Comment: 3 glasses of wine today  . Drug use: No  . Sexual activity: Not Currently  Other Topics Concern  . 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 There is no history of Colon cancer, Stomach cancer, Rectal cancer, Esophageal cancer, Liver disease, or Kidney disease.  ROS:   Please see the history of present illness.    All other systems reviewed and are negative.  EKGs/Labs/Other Studies Reviewed:    The following studies were reviewed today:  May 12, 2020 ZIO personally reviewed  Rare ventricular ectopy.  No sustained ventricular arrhythmias. 27 pauses, longest lasting 6.7 seconds.  Pauses occurred during awake hours. Heart rate 32-79, average 52  Sep 10, 2019 echo personally reviewed Left ventricular function low normal, 50% Right ventricular  function normal  inferobasal hypokinesis  October 25, 2019 left heart cath  Prox RCA to Dist RCA lesion is 100% stenosed.  Prox Cx to Dist Cx lesion is 100% stenosed with 100% stenosed side branch in 2nd  Mrg.  Prox LAD lesion is 75% stenosed. 2nd Diag lesion is 70% stenosed.  Mid LAD-1 lesion is 65% stenosed with 60% stenosed side branch in 1st Sept.  Mid LAD-2 lesion is 99% stenosed / subtotally occluded (no competetive / retrograde flow via LIMA).  LIMA-LAD graft was visualized by angiography and is normal in caliber. The graft exhibits no disease. Dist LAD lesion is 50% stenosed.  Seq SVG- OM2-OM3; (known occluded 2nd Limb) was visualized by angiography and is large.  Origin lesion is 40% stenosed. Prox Graft STENT is 10% stenosed. Mid Graft to Dist Graft STENT is 20% stenosed.  Original SVG-PDA-PL not injected due to known occlusion is large with extensive stents.  ReDo SVG-dRCA graft was visualized by angiography and is large. Prox Graft to Mid Graft lesion is 100% stenosed.  There is mild to moderate left ventricular systolic dysfunction.  The left ventricular ejection fraction is 45-50% by visual estimate.  There is no aortic valve stenosis.    Recent Labs: 09/10/2019: B Natriuretic Peptide 284.4 09/12/2019: Magnesium 1.9 04/10/2020: TSH 3.410 04/30/2020: NT-Pro BNP 412 05/16/2020: ALT 24; BUN 34; Creatinine, Ser 1.75; Hemoglobin 12.0; Platelets 167; Potassium 3.9; Sodium 140  Recent Lipid Panel    Component Value Date/Time   CHOL 121 01/23/2020 1040   TRIG 191 (H) 01/23/2020 1040   HDL 30 (L) 01/23/2020 1040   CHOLHDL 4.0 01/23/2020 1040   CHOLHDL 3.2 04/18/2009 0241   VLDL 23 04/18/2009 0241   LDLCALC 59 01/23/2020 1040    Physical Exam:    VS:  BP 118/70   Pulse 62   Ht 5' 11"  (1.803 m)   Wt 157 lb (71.2 kg)   SpO2 96%   BMI 21.90 kg/m     Wt Readings from Last 3 Encounters:  05/16/20 157 lb (71.2 kg)  05/16/20 153 lb (69.4 kg)  04/30/20 158 lb (71.7  kg)     GEN:  Well nourished, well developed in no acute distress HEENT: Normal NECK: No JVD; No carotid bruits LYMPHATICS: No lymphadenopathy CARDIAC: RRR, no murmurs, rubs, gallops RESPIRATORY:  Clear to auscultation without rales, wheezing or rhonchi  ABDOMEN: Soft, non-tender, non-distended MUSCULOSKELETAL:  No edema; No deformity  SKIN: Warm and dry NEUROLOGIC:  Alert and oriented x 3 PSYCHIATRIC:  Normal affect   ASSESSMENT:    1. Syncope and collapse   2. Sinus node dysfunction (HCC)   3. Sinus arrest   4. Paroxysmal atrial fibrillation (HCC)    PLAN:    In order of problems listed above:  1. Sinus node dysfunction, sinus arrest, syncope Patient with evidence of sinus node dysfunction and sinus arrest on recent Holter monitor.  These findings are in the setting of recurrent syncopal episodes and near syncope.  His most recent episode of near syncope was this morning after an MRI.  His symptoms started while he was still laying flat.  There is obviously a component of sinus node dysfunction contributing to his presyncope and syncope although there may be a superimposed component of vasovagal syncope.  Given the findings discussed above, would recommend implanting a dual-chamber permanent pacemaker.  We will plan to implant a Biotronik device with closed-loop stimulation capabilities.  I discussed the procedure in detail with the patient and wife who is with him today in clinic.  The risks, recovery time were discussed in detail with them and they wish to proceed.  He will continue taking his Plavix in the perioperative period given his severe coronary artery disease.  We will plan to hold the Xarelto for 2 days prior to the procedure and will restart it 5 days after the procedure.  Risks, benefits, alternatives to PPM implantation were discussed in detail with the patient today. The patient understands that the risks include but are not limited to bleeding, infection, pneumothorax,  perforation, tamponade, vascular damage, renal failure, MI, stroke, death, and lead dislodgement and wishes to proceed.  We will therefore schedule device implantation at the next available time.  2. paroxysmal atrial fibrillation In sinus rhythm today.  Hold Xarelto as above.  3. severe coronary artery disease Not a candidate for revascularization.  Continue Plavix given severe coronary obstruction as detailed above.  Medication Adjustments/Labs and Tests Ordered: Current medicines are reviewed at length with the patient today.  Concerns regarding medicines are outlined above.  No orders of the defined types were placed in this encounter.  No orders of the defined types were placed in this encounter.    Signed, Lars Mage, MD, [facility]  05/16/2020 3:36 PM    Electrophysiology [facility]

## 2020-05-16 NOTE — ED Triage Notes (Addendum)
Patient brought over rapid response from MRI.  After MRI of abdomen, patient became hypotensive, feeling dizzy, and rapid response was called.  Patient has been having dizzy spells and wore a heart monitor for a week.  Cardiac appt was supposed to happen this afternoon to talk about possible pace maker for patient.  Patient has a large mass on his kidney per wife at bedside.

## 2020-05-17 ENCOUNTER — Other Ambulatory Visit (HOSPITAL_COMMUNITY)
Admission: RE | Admit: 2020-05-17 | Discharge: 2020-05-17 | Disposition: A | Discharge status: Home / Self Care | Payer: Medicare HMO | Source: Ambulatory Visit | Attending: Cardiology | Admitting: Cardiology

## 2020-05-17 DIAGNOSIS — Z01812 Encounter for preprocedural laboratory examination: Secondary | ICD-10-CM | POA: Insufficient documentation

## 2020-05-17 DIAGNOSIS — Z20822 Contact with and (suspected) exposure to covid-19: Secondary | ICD-10-CM | POA: Diagnosis not present

## 2020-05-17 LAB — SARS CORONAVIRUS 2 (TAT 6-24 HRS): SARS Coronavirus 2: NEGATIVE

## 2020-05-18 NOTE — Progress Notes (Signed)
Attempted to call patient regarding procedure on Monday.  No answer

## 2020-05-21 ENCOUNTER — Ambulatory Visit (HOSPITAL_COMMUNITY)
Admission: RE | Disposition: A | Discharge status: Home / Self Care | Payer: Self-pay | Source: Home / Self Care | Attending: Cardiology

## 2020-05-21 ENCOUNTER — Ambulatory Visit (HOSPITAL_COMMUNITY)
Admission: RE | Admit: 2020-05-21 | Discharge: 2020-05-21 | Disposition: A | Discharge status: Home / Self Care | Payer: Medicare HMO | Attending: Cardiology | Admitting: Cardiology

## 2020-05-21 ENCOUNTER — Ambulatory Visit (HOSPITAL_COMMUNITY): Payer: Medicare HMO

## 2020-05-21 ENCOUNTER — Other Ambulatory Visit: Payer: Self-pay

## 2020-05-21 DIAGNOSIS — Z79899 Other long term (current) drug therapy: Secondary | ICD-10-CM | POA: Diagnosis not present

## 2020-05-21 DIAGNOSIS — I495 Sick sinus syndrome: Secondary | ICD-10-CM | POA: Diagnosis not present

## 2020-05-21 DIAGNOSIS — Z7902 Long term (current) use of antithrombotics/antiplatelets: Secondary | ICD-10-CM | POA: Insufficient documentation

## 2020-05-21 DIAGNOSIS — I2510 Atherosclerotic heart disease of native coronary artery without angina pectoris: Secondary | ICD-10-CM | POA: Diagnosis not present

## 2020-05-21 DIAGNOSIS — Z881 Allergy status to other antibiotic agents status: Secondary | ICD-10-CM | POA: Insufficient documentation

## 2020-05-21 DIAGNOSIS — Z79890 Hormone replacement therapy (postmenopausal): Secondary | ICD-10-CM | POA: Diagnosis not present

## 2020-05-21 DIAGNOSIS — Z7901 Long term (current) use of anticoagulants: Secondary | ICD-10-CM | POA: Insufficient documentation

## 2020-05-21 DIAGNOSIS — R55 Syncope and collapse: Secondary | ICD-10-CM | POA: Insufficient documentation

## 2020-05-21 DIAGNOSIS — I480 Paroxysmal atrial fibrillation: Secondary | ICD-10-CM | POA: Diagnosis not present

## 2020-05-21 DIAGNOSIS — Z87891 Personal history of nicotine dependence: Secondary | ICD-10-CM | POA: Insufficient documentation

## 2020-05-21 DIAGNOSIS — Z950 Presence of cardiac pacemaker: Secondary | ICD-10-CM

## 2020-05-21 DIAGNOSIS — I517 Cardiomegaly: Secondary | ICD-10-CM | POA: Diagnosis not present

## 2020-05-21 HISTORY — PX: PACEMAKER IMPLANT: EP1218

## 2020-05-21 SURGERY — PACEMAKER IMPLANT

## 2020-05-21 MED ORDER — MIDAZOLAM HCL 5 MG/5ML IJ SOLN
INTRAMUSCULAR | Status: DC | PRN
  Administered 2020-05-21: 1 mg via INTRAVENOUS
  Administered 2020-05-21: 2 mg via INTRAVENOUS
  Administered 2020-05-21: 1 mg via INTRAVENOUS

## 2020-05-21 MED ORDER — CHLORHEXIDINE GLUCONATE 4 % EX LIQD: 4.0000 "application " | Freq: Once | CUTANEOUS | Status: DC

## 2020-05-21 MED ORDER — FENTANYL CITRATE (PF) 100 MCG/2ML IJ SOLN
INTRAMUSCULAR | Status: AC
  Filled 2020-05-21: qty 2

## 2020-05-21 MED ORDER — SODIUM CHLORIDE 0.9 % IV SOLN
INTRAVENOUS | Status: AC
  Filled 2020-05-21: qty 2

## 2020-05-21 MED ORDER — HEPARIN (PORCINE) IN NACL 1000-0.9 UT/500ML-% IV SOLN
INTRAVENOUS | Status: AC
  Filled 2020-05-21: qty 500

## 2020-05-21 MED ORDER — SODIUM CHLORIDE 0.9 % IV SOLN: INTRAVENOUS | Status: DC

## 2020-05-21 MED ORDER — HEPARIN (PORCINE) IN NACL 1000-0.9 UT/500ML-% IV SOLN
INTRAVENOUS | Status: DC | PRN
  Administered 2020-05-21: 500 mL

## 2020-05-21 MED ORDER — SODIUM CHLORIDE 0.9 % IV SOLN
80.0000 mg | INTRAVENOUS | Status: AC
  Administered 2020-05-21: 80 mg

## 2020-05-21 MED ORDER — VANCOMYCIN HCL IN DEXTROSE 1-5 GM/200ML-% IV SOLN
INTRAVENOUS | Status: AC
  Filled 2020-05-21: qty 200

## 2020-05-21 MED ORDER — MIDAZOLAM HCL 5 MG/5ML IJ SOLN
INTRAMUSCULAR | Status: AC
  Filled 2020-05-21: qty 5

## 2020-05-21 MED ORDER — VANCOMYCIN HCL IN DEXTROSE 1-5 GM/200ML-% IV SOLN
1000.0000 mg | INTRAVENOUS | Status: AC
  Administered 2020-05-21: 1000 mg via INTRAVENOUS

## 2020-05-21 MED ORDER — LIDOCAINE HCL (PF) 1 % IJ SOLN
INTRAMUSCULAR | Status: DC | PRN
  Administered 2020-05-21: 60 mL

## 2020-05-21 MED ORDER — LIDOCAINE HCL 1 % IJ SOLN
INTRAMUSCULAR | Status: AC
  Filled 2020-05-21: qty 60

## 2020-05-21 MED ORDER — ACETAMINOPHEN 325 MG PO TABS: 325.0000 mg | ORAL_TABLET | ORAL | Status: DC | PRN

## 2020-05-21 MED ORDER — FENTANYL CITRATE (PF) 100 MCG/2ML IJ SOLN
INTRAMUSCULAR | Status: DC | PRN
  Administered 2020-05-21 (×3): 25 ug via INTRAVENOUS

## 2020-05-21 MED ORDER — RIVAROXABAN 20 MG PO TABS: 20.0000 mg | ORAL_TABLET | Freq: Every day | ORAL | 3 refills | Status: DC

## 2020-05-21 MED ORDER — CLOPIDOGREL BISULFATE 75 MG PO TABS: 75.0000 mg | ORAL_TABLET | Freq: Every day | ORAL | Status: AC

## 2020-05-21 MED ORDER — ONDANSETRON HCL 4 MG/2ML IJ SOLN: 4.0000 mg | Freq: Four times a day (QID) | INTRAMUSCULAR | Status: DC | PRN

## 2020-05-21 MED ORDER — POVIDONE-IODINE 10 % EX SWAB
2.0000 "application " | Freq: Once | CUTANEOUS | Status: AC
  Administered 2020-05-21: 2 via TOPICAL

## 2020-05-21 SURGICAL SUPPLY — 8 items
CABLE SURGICAL S-101-97-12 (CABLE) ×2 IMPLANT
LEAD SOLIA S PRO MRI 53 (Lead) ×1 IMPLANT
LEAD SOLIA S PRO MRI 60 (Lead) ×1 IMPLANT
PACEMAKER EDORA 8DR-T MRI (Pacemaker) ×1 IMPLANT
PAD PRO RADIOLUCENT 2001M-C (PAD) ×2 IMPLANT
SHEATH 7FR PRELUDE SNAP 13 (SHEATH) ×2 IMPLANT
SHEATH PROBE COVER 6X72 (BAG) ×1 IMPLANT
TRAY PACEMAKER INSERTION (PACKS) ×2 IMPLANT

## 2020-05-21 NOTE — Progress Notes (Signed)
Patient was given discharge instructions. He verbalized understanding. 

## 2020-05-21 NOTE — Discharge Instructions (Signed)
After Your Pacemaker    You have a Biotronik Pacemaker  ACTIVITY  Do not lift your arm above shoulder height for 1 week after your procedure. After 7 days, you may progress as below.   You should remove your sling 24 hours after your procedure, unless otherwise instructed by your provider.     Monday May 28, 2020  Tuesday May 29, 2020 Wednesday May 30, 2020 Thursday May 31, 2020    Do not lift, push, pull, or carry anything over 10 pounds with the affected arm until 6 weeks (Monday July 02, 2020 ) after your procedure.    Do NOT DRIVE until you have been seen for your wound check, or as long as instructed by your healthcare provider.    Ask your healthcare provider when you can go back to work   INCISION/Dressing  If you are on a blood thinner such as Coumadin, Xarelto, Eliquis, Plavix, or Pradaxa please confirm with your provider when this should be resumed.    If large square, outer bandage is left in place, this can be removed after 24 hours from your procedure. Do not remove steri-strips or glue as below.    Monitor your Pacemaker site for redness, swelling, and drainage. Call the device clinic at [phone] if you experience these symptoms or fever/chills.   If your incision is sealed with Steri-strips or staples, you may shower 10 days after your procedure or when told by your provider. Do not remove the steri-strips or let the shower hit directly on your site. You may wash around your site with soap and water.     Avoid lotions, ointments, or perfumes over your incision until it is well-healed.   You may use a hot tub or a pool AFTER your wound check appointment if the incision is completely closed.   PAcemaker Alerts:  Some alerts are vibratory and others beep. These are NOT emergencies. Please call our office to let us know. If this occurs at night or on weekends, it can wait until the next business day. Send a remote transmission.   If your  device is capable of reading fluid status (for heart failure), you will be offered monthly monitoring to review this with you.   DEVICE MANAGEMENT  Remote monitoring is used to monitor your pacemaker from home. This monitoring is scheduled every 91 days by our office. It allows [place] to keep an eye on the functioning of your device to ensure it is working properly. You will routinely see your Electrophysiologist annually (more often if necessary).    You should receive your ID card for your new device in 4-8 weeks. Keep this card with you at all times once received. Consider wearing a medical alert bracelet or necklace.   Your Pacemaker may be MRI compatible. This will be discussed at your next office visit/wound check.  You should avoid contact with strong electric or magnetic fields.    Do not use amateur (ham) radio equipment or electric (arc) welding torches. MP3 player headphones with magnets should not be used. Some devices are safe to use if held at least 12 inches (30 cm) from your Pacemaker. These include power tools, lawn mowers, and speakers. If you are unsure if something is safe to use, ask your health care provider.   When using your cell phone, hold it to the ear that is on the opposite side from the Pacemaker. Do not leave your cell phone in a pocket over the Pacemaker.  You may safely use electric blankets, heating pads, computers, and microwave ovens.  Call the office right away if:  You have chest pain.  You feel more short of breath than you have felt before.  You feel more light-headed than you have felt before.  Your incision starts to open up.  This information is not intended to replace advice given to you by your health care provider. Make sure you discuss any questions you have with your health care provider.

## 2020-05-21 NOTE — Interval H&P Note (Signed)
History and Physical Interval Note:  05/21/2020 11:56 AM  Ian Cummings  has presented today for surgery, with the diagnosis of bradycardiaa.  The various methods of treatment have been discussed with the patient and family. After consideration of risks, benefits and other options for treatment, the patient has consented to  Procedure(s): PACEMAKER IMPLANT (N/A) as a surgical intervention.  The patient's history has been reviewed, patient examined, no change in status, stable for surgery.  I have reviewed the patient's chart and labs.  Questions were answered to the patient's satisfaction.     Letishia Elliott T Shayla Heming

## 2020-05-22 ENCOUNTER — Other Ambulatory Visit: Payer: Self-pay | Admitting: Cardiovascular Disease

## 2020-05-22 ENCOUNTER — Telehealth: Payer: Self-pay | Admitting: Emergency Medicine

## 2020-05-22 ENCOUNTER — Encounter (HOSPITAL_COMMUNITY): Payer: Self-pay | Admitting: Cardiology

## 2020-05-22 DIAGNOSIS — D49511 Neoplasm of unspecified behavior of right kidney: Secondary | ICD-10-CM | POA: Diagnosis not present

## 2020-05-22 MED FILL — Lidocaine HCl Local Inj 1%: INTRAMUSCULAR | Qty: 60 | Status: AC

## 2020-05-22 NOTE — Telephone Encounter (Unsigned)
Follow-up after same day discharge: Implant date: 05/21/2020 MD: Dr. Lars Mage Device: Biotronik [id number] [place] 8 DR-T Location: Left Chest   Wound check visit: 06/05/20 @ 10:40 AM 90 day MD follow-up: 08/28/2020 @ 2:00 PM  Remote Transmission received:Yes  Dressing removed: Unable to assess  Attempted to call patient to discuss same day d/c. No answer, LMOVM.

## 2020-05-23 NOTE — Telephone Encounter (Signed)
Attempted to reach pt, no answer; LVM with device clinic number and hours.

## 2020-05-23 NOTE — Telephone Encounter (Signed)
Pt returned phone call.   Follow-up after same day discharge: Implant date: 05/21/20 MD: Dr. Quentin Ore Device: Biotronik Location: left chest   Wound check visit: 06/05/20 90 day MD follow-up: 08/28/20  Remote Transmission received:Yes  Dressing removed: Not yet, pt educated to remove outer bandage.  Educated on s/s of infection to monitor for and report.    Mobility restrictions discussed.  Pt advised to stop using sling.

## 2020-05-24 ENCOUNTER — Institutional Professional Consult (permissible substitution): Payer: Medicare HMO | Admitting: Cardiology

## 2020-05-25 ENCOUNTER — Ambulatory Visit: Payer: Medicare HMO | Admitting: Physician Assistant

## 2020-05-25 ENCOUNTER — Other Ambulatory Visit: Payer: Self-pay

## 2020-05-25 ENCOUNTER — Ambulatory Visit (INDEPENDENT_AMBULATORY_CARE_PROVIDER_SITE_OTHER)
Admission: RE | Admit: 2020-05-25 | Discharge: 2020-05-25 | Disposition: A | Discharge status: Home / Self Care | Payer: Medicare HMO | Source: Ambulatory Visit | Attending: Physician Assistant | Admitting: Physician Assistant

## 2020-05-25 ENCOUNTER — Ambulatory Visit (HOSPITAL_COMMUNITY)
Admission: RE | Admit: 2020-05-25 | Discharge: 2020-05-25 | Disposition: A | Discharge status: Home / Self Care | Payer: Medicare HMO | Source: Ambulatory Visit | Attending: Physician Assistant | Admitting: Physician Assistant

## 2020-05-25 VITALS — BP 88/49 | HR 68 | Temp 98.6°F | Resp 20 | Ht 70.0 in | Wt 157.5 lb

## 2020-05-25 DIAGNOSIS — I7025 Atherosclerosis of native arteries of other extremities with ulceration: Secondary | ICD-10-CM

## 2020-05-25 DIAGNOSIS — I6523 Occlusion and stenosis of bilateral carotid arteries: Secondary | ICD-10-CM | POA: Diagnosis not present

## 2020-05-25 DIAGNOSIS — I70219 Atherosclerosis of native arteries of extremities with intermittent claudication, unspecified extremity: Secondary | ICD-10-CM | POA: Insufficient documentation

## 2020-05-25 NOTE — Progress Notes (Signed)
Office Note     CC:  follow up Requesting Provider:  Marton Redwood, MD  HPI: Ian Cummings is a 87 y.o. (1929/02/14) male who presents for follow up of peripheral artery disease. He is status post right iliofemoral endarterectomy with bovine patch and right femoral to below the knee popliteal artery bypass with PTFE on 08/03/2015 by Dr. Trula Slade due to heel and great toe ulcer which have since healed. He has a known left SFA occlusion. At his last visit he was having stable claudication symptoms without rest pain or wounds.   He returns today for follow up with non invasive studies. He continues to have claudication symptoms in the left leg. He says he can walk about 50 feet before he has significant calf discomfort. He feels that this is worsening since his last visit. Patient however denies any rest pain or nonhealing wounds. He does have pain in both hips, left greater than right.  He attributes this to osteoarthritis however it is worsened by ambulation. Pain does improve with rest. He denies any claudication symptoms in the right leg.  He denies any amaurosis fugax or vision changes, no slurred speech, facial drooping, weakness or numbness of upper or lower extremities .He just had a pacemaker placed on 05/21/20 by Dr. Quentin Ore. He is feeling a little light headed on and off since. Blood pressure is low at today's visit. He took his regular home blood pressure medications this morning.   He has history of right carotid stent performed at [facility] in 2013. He is also followed by Dr. Bettina Gavia for PAF, CAD, diastolic HF, and mitral valve regurgitation.Marland Kitchen His CAD is being managed medically  The pt is on a statin for cholesterol management.  The pt is not on a daily aspirin.   Other AC: Xarelto Plavix The pt is on CCB, diuretic and Entresto for hypertension.   The pt is not diabetic.  Tobacco hx:  Former smoker, quit 1977  Past Medical History:  Diagnosis Date  . AAA (abdominal aortic aneurysm)  ([place])    2004, Pt stated not there now  . Anginal pain ([place])   . Anxiety   . Arthritis   . Atherosclerosis of native arteries of the extremities with intermittent claudication 12/04/2011   ABI .87 and .54 in 2012   . Atherosclerotic PVD with intermittent claudication ([place]) 02/20/2014  . BPH (benign prostatic hypertrophy)   . Bradycardia 01/12/2019  . Bradycardia on ECG 11/11/2017  . CAD (coronary artery disease)   . Carotid artery disease ([place])    Right carotid stent 2013  . Carotid artery disease without cerebral infarction [facility])    S/p right carotid stent for asymptomatic disease  2013   . Chronic diastolic (congestive) heart failure ([place]) 09/10/2019  . Chronic kidney disease, stage 3a ([place]) 09/10/2019  . Claudication ([place])   . Colon polyps    adenomatous  . Complex sleep apnea syndrome 11/11/2017  . Conjunctival lesion 05/04/2014   Formatting of this note might be different from the original. left  . Coronary artery disease   . Coronary artery disease involving native coronary artery of native heart with unstable angina pectoris ([place]) 09/06/2012   CABG w LIMA to LAD, SVG to OM1-OM2, SVG to dx, SVG to PD/PL 1995,  Cypher stent x 2 SVG to RCA and Cypher stent to Circ Graft 05/13/07, stent of SVG to circ OM in [place] in July 2013  Cath 4/14 [facility] normal Left main, occluded mid LAD, occluded RCA SVG,  occluded CFX, widely patent OM 1 SVG, occluded RCA SVG, occluded Diag 1 SVG, widely patent LAD LIMA graft;  Redo CABG Dr. Servando Snare  10/18/12 with   . Depression   . Essential hypertension 01/12/2019  . GERD (gastroesophageal reflux disease)   . Gout   . H/O hiatal hernia   . Hyperlipidemia   . Hypertension   . Hypertension, renal disease, stage 1-4 or unspecified chronic kidney disease   . Hypothyroidism   . Malnutrition of moderate degree 08/05/2015  . MI (myocardial infarction) ([place]) 4/14  . Mixed hyperlipidemia   . Neuropathy    Hx: of in B/L toes  . OSA on CPAP 02/17/2018  . PAD  (peripheral artery disease) ([place]) 08/03/2015  . PAF (paroxysmal atrial fibrillation) ([place])   . Peripheral vascular disease ([place])    with claudication  . Pre-diabetes   . RBBB 01/12/2019  . s/p CABG   . Shoulder pain 01/27/2019  . Stroke ([place])   . TIA (transient ischemic attack)    Hx: of  . Unstable angina ([place]) 09/10/2019    Past Surgical History:  Procedure Laterality Date  . CARDIOVERSION N/A 02/22/2020   Procedure: CARDIOVERSION;  Surgeon: [name], MD;  Location: [place];  Service: Cardiovascular;  Laterality: N/A;  . CAROTID STENT INSERTION Right 2013   right at [facility]  . CATARACT EXTRACTION Bilateral    Hx: of both eyes  . COLONOSCOPY     Hx: of  . COLONOSCOPY    . CORONARY ANGIOPLASTY WITH STENT PLACEMENT  2010  . CORONARY ARTERY BYPASS GRAFT  1995  . CORONARY ARTERY BYPASS GRAFT N/A 10/18/2012   Procedure: REDO CORONARY ARTERY BYPASS GRAFTING (CABG);  Surgeon: Grace Isaac, MD;  Location: [place];  Service: Open Heart Surgery;  Laterality: N/A;  off pump times two using endoscopically harvested left saphenous vein  . CORONARY STENT PLACEMENT  06/2016  . ENDARTERECTOMY FEMORAL Right 08/03/2015   Procedure: RIGHT FEMORAL ENDARTERECTOMY WITH PATCH ANGIOPLASTY;  Surgeon: Serafina Mitchell, MD;  Location: [place];  Service: Vascular;  Laterality: Right;  . FEMORAL-POPLITEAL BYPASS GRAFT Right 08/03/2015   Procedure: RIGHT FEMORAL-BELOW KNEE POPLITEAL ARTERY BYPASS GRAFT;  Surgeon: Serafina Mitchell, MD;  Location: [place];  Service: Vascular;  Laterality: Right;  . FOOT SURGERY Left   . FRACTURE SURGERY Left    Hx: of Left heel  . INGUINAL HERNIA REPAIR Bilateral    X 2   . INTRAOPERATIVE TRANSESOPHAGEAL ECHOCARDIOGRAM N/A 10/18/2012   Procedure: INTRAOPERATIVE TRANSESOPHAGEAL ECHOCARDIOGRAM;  Surgeon: Grace Isaac, MD;  Location: [place];  Service: Open Heart Surgery;  Laterality: N/A;  . LEFT HEART CATH AND CORS/GRAFTS ANGIOGRAPHY N/A 10/25/2019   Procedure:  LEFT HEART CATH AND CORS/GRAFTS ANGIOGRAPHY;  Surgeon: Leonie Man, MD;  Location: [place] CV LAB;  Service: Cardiovascular;  Laterality: N/A;  . LEFT HEART CATHETERIZATION WITH CORONARY/GRAFT ANGIOGRAM N/A 02/02/2013   Procedure: LEFT HEART CATHETERIZATION WITH Beatrix Fetters;  Surgeon: Jacolyn Reedy, MD;  Location: [facility] CATH LAB;  Service: Cardiovascular;  Laterality: N/A;  . LOWER EXTREMITY ANGIOGRAM Right 06/27/2015   Procedure: Lower Extremity Angiogram;  Surgeon: Serafina Mitchell, MD;  Location: [place] CV LAB;  Service: Cardiovascular;  Laterality: Right;  . MASTOIDECTOMY  1933  . PACEMAKER IMPLANT N/A 05/21/2020   Procedure: PACEMAKER IMPLANT;  Surgeon: Vickie Epley, MD;  Location: [place] CV LAB;  Service: Cardiovascular;  Laterality: N/A;  . PERIPHERAL VASCULAR CATHETERIZATION N/A 01/23/2015   Procedure:  Abdominal Aortogram;  Surgeon: Serafina Mitchell, MD;  Location: [place] CV LAB;  Service: Cardiovascular;  Laterality: N/A;  . PERIPHERAL VASCULAR CATHETERIZATION N/A 06/27/2015   Procedure: Abdominal Aortogram;  Surgeon: Serafina Mitchell, MD;  Location: [place] CV LAB;  Service: Cardiovascular;  Laterality: N/A;  . RETINAL DETACHMENT SURGERY Left    Hx: of left eye  . TEE WITHOUT CARDIOVERSION N/A 02/22/2020   Procedure: TRANSESOPHAGEAL ECHOCARDIOGRAM (TEE);  Surgeon: [name], MD;  Location: [place];  Service: Cardiovascular;  Laterality: N/A;  . TONSILLECTOMY      Social History   Socioeconomic History  . Marital status: Married    Spouse name: Not on file  . Number of children: 3  . Years of education: Not on file  . Highest education level: Not on file  Occupational History  . Occupation: retired  Tobacco Use  . Smoking status: Former Smoker    Types: Cigarettes    Quit date: 04/29/1975    Years since quitting: 45.1  . Smokeless tobacco: Never Used  Vaping Use  . Vaping Use: Never used  Substance and Sexual Activity  .  Alcohol use: Yes    Alcohol/week: 3.0 standard drinks    Types: 3 Glasses of wine per week    Comment: 3 glasses of wine today  . Drug use: No  . Sexual activity: Not Currently  Other Topics Concern  . 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 Heart disease Mother        After 91 yrs of age  . Heart attack Mother   . Hyperlipidemia Father   . Hypertension Father   . Heart disease Father        After 74 yrs of age  . Diabetes Father   . Stroke Sister   . Colon cancer Neg Hx   . Stomach cancer Neg Hx   . Rectal cancer Neg Hx   . Esophageal cancer Neg Hx   . Liver disease Neg Hx   . Kidney disease Neg Hx     Current Outpatient Medications  Medication Sig Dispense Refill  . ACCU-CHEK AVIVA PLUS test strip     . allopurinol (ZYLOPRIM) 100 MG tablet Take 100 mg by mouth daily.     Marland Kitchen amLODipine (NORVASC) 5 MG tablet Take 5 mg by mouth daily.    . Blood Glucose Monitoring Suppl (ACCU-CHEK AVIVA PLUS) w/Device KIT     . clopidogrel (PLAVIX) 75 MG tablet Take 1 tablet (75 mg total) by mouth daily.    Marland Kitchen escitalopram (LEXAPRO) 10 MG tablet Take 10 mg by mouth daily.    . furosemide (LASIX) 20 MG tablet TAKE 2 TABLETS BY MOUTH 2 TIMES DAILY. 120 tablet 11  . HYDROcodone-acetaminophen (NORCO/VICODIN) 5-325 MG tablet Take 1 tablet by mouth every 4 (four) hours as needed for moderate pain.     . isosorbide mononitrate (IMDUR) 30 MG 24 hr tablet Take 30 mg by mouth daily.    . lansoprazole (PREVACID) 30 MG capsule Take 30 mg by mouth daily.    Marland Kitchen levothyroxine (SYNTHROID, LEVOTHROID) 88 MCG tablet  Take 88 mcg by mouth daily before breakfast.     . melatonin 5 MG TABS Take 5 mg by mouth at bedtime.    . multivitamin (RENA-VIT) TABS tablet Take 1 tablet by mouth daily.    . nitroGLYCERIN (NITROSTAT) 0.4 MG SL tablet Place 0.4 mg under the tongue every 5 (five) minutes as needed for chest pain.     Derrill Memo ON 05/26/2020] rivaroxaban (XARELTO) 20 MG TABS tablet Take 1 tablet (20 mg total) by mouth daily with supper. 90 tablet 3  . rosuvastatin (CRESTOR) 40 MG tablet Take 40 mg by mouth daily.    . sacubitril-valsartan (ENTRESTO) 24-26 MG Take 1 tablet by mouth daily. 60 tablet 3   No current facility-administered medications for this visit.    Allergies  Allergen Reactions  . Amoxicillin Rash  . Metoprolol Itching    When out in the sun, pt is currently taking.     REVIEW OF SYSTEMS:  $RemoveB'[X]'MAgPzBIx$  denotes positive finding, $RemoveBeforeDEI'[ ]'SgNyTlSFrybVNePW$  denotes negative finding Cardiac  Comments:  Chest pain or chest pressure:    Shortness of breath upon exertion:    Short of breath when lying flat:    Irregular heart rhythm:        Vascular    Pain in calf, thigh, or hip brought on by ambulation: X   Pain in feet at night that wakes you up from your sleep:     Blood clot in your veins:    Leg swelling:         Pulmonary    Oxygen at home:    Productive cough:     Wheezing:         Neurologic    Sudden weakness in arms or legs:     Sudden numbness in arms or legs:     Sudden onset of difficulty speaking or slurred speech:    Temporary loss of vision in one eye:     Problems with dizziness:  X       Gastrointestinal    Blood in stool:     Vomited blood:         Genitourinary    Burning when urinating:     Blood in urine:        Psychiatric    Major depression:         Hematologic    Bleeding problems:    Problems with blood clotting too easily:        Skin    Rashes or ulcers:        Constitutional    Fever or chills:      PHYSICAL EXAMINATION:  Vitals:   05/25/20 1104 05/25/20 1107  BP: (!) 97/51 (!) 88/49  Pulse: 68    Resp: 20   Temp: 98.6 F (37 C)   TempSrc: Temporal   SpO2: 97%   Weight: 157 lb 8 oz (71.4 kg)   Height: $Remove'5\' 10"'qxIWHoQ$  (1.778 m)     General:  WDWN in NAD; vital signs documented above Gait: Normal HENT: WNL, normocephalic Pulmonary: normal non-labored breathing , without wheezing Cardiac: regular HR, without  Murmurs with carotid bruit on the left Abdomen: soft, NT, no masses Vascular Exam/Pulses:  Right Left  Radial 2+ (normal) 2+ (normal)  Femoral 2+ (normal) 2+ (normal)  Popliteal Not palpable Not palpable  DP 2+ (normal) Not palpable  PT 1+ (weak) 1+ (weak)   Extremities: without ischemic changes, without Gangrene , without cellulitis; without open wounds;  Musculoskeletal: no muscle  wasting or atrophy  Neurologic: A&O X 3;  No focal weakness or paresthesias are detected Psychiatric:  The pt has Normal affect.   Non-Invasive Vascular Imaging:   +-------+-----------+-----------+------------+------------+  ABI/TBIToday's ABIToday's TBIPrevious ABIPrevious TBI  +-------+-----------+-----------+------------+------------+  Right 0.95    0.42    0.93    0.52      +-------+-----------+-----------+------------+------------+  Left  0.84    0.30    0.66    0        +-------+-----------+-----------+------------+------------+  The left toe-brachial index is abnormal 26 mmHg The right toe- brachial index is abnormal 42 mm Hg  I independently reviewed today's right lower extremity arterial duplex. It shows widely patent right femoral to popliteal bypass. No evidence of stenosis. Does have some monophasic inflow but the velocities are essentially unchanged from prior study  VAS Carotid Duplex: Summary:  Right Carotid: Patent stent with no visualized stenosis. The ECA appears >50% stenosed.   Left Carotid: Velocities in the left ICA are consistent with a 60-79% stenosis. The ECA appears >50% stenosed.   Vertebrals: Bilateral vertebral  arteries demonstrate antegrade flow.  Subclavians: Right subclavian artery was stenotic. Normal flow hemodynamics were seen in the left subclavian artery.    ASSESSMENT/PLAN:: 87 y.o. male here for follow up for peripheral artery disease. He has increased LLE claudication. He has known SFA occlusion with single vessel peroneal runoff. He would likely require a bypass should he develop rest pain or a non healing wound. He has been told from Cardiac standpoint that he would not be candidate for any invasive procedures due to his severe CAD. I therefore have recommended close follow up at this time. I have encouraged him to do a walking regimen to promote collaterals. He presently does not have rest pain or tissue loss. His ABIs today are essentially unchanged from prior visit however his left Toe pressure went from 61 to 26 mmHg. His carotid duplex today shows patent right ICA stent. The left ICA has increased velocities from prior study and is now in the 60-79% stenosis range. He remains without symptoms at this time. We will repeat the duplex in 6 months.  - He will continue his statin, Plavix and Xarelto - Patient hypotensive and dizzy during examination. I have recommend that patient not take his home blood pressure medications if SBP<120. He has home blood pressure cuff to assess this - Keep follow up with Dr. Quentin Ore on 05/30/20 or call earlier or go to ER if worsening symptoms of dizziness/ lightheadedness - Reviewed signs and symptoms of TIA/ Stroke and should these occur they should go to the ER - If he should have worsening lower extremity symptoms I advised them to call for earlier follow up - He will follow up in 3 months with ABI, he will have repeat carotid duplex in 6 months   Karoline Caldwell, PA-C [facility] [phone]  Clinic MD:   Dr. Donzetta Matters

## 2020-05-28 ENCOUNTER — Other Ambulatory Visit: Payer: Self-pay

## 2020-05-28 DIAGNOSIS — I70219 Atherosclerosis of native arteries of extremities with intermittent claudication, unspecified extremity: Secondary | ICD-10-CM

## 2020-05-28 DIAGNOSIS — I6523 Occlusion and stenosis of bilateral carotid arteries: Secondary | ICD-10-CM

## 2020-05-29 NOTE — Progress Notes (Signed)
Cardiology Office Note:    Date:  05/30/2020   ID:  Ian Cummings, DOB 17-Jul-1928, MRN [id number]  PCP:  Marton Redwood, MD  Cardiologist:  Shirlee More, MD    Referring MD: Marton Redwood, MD    ASSESSMENT:    1. Pacemaker   2. Sinus node dysfunction (HCC)   3. Paroxysmal atrial fibrillation ([place])   4. Chronic anticoagulation   5. On amiodarone therapy   6. Chronic combined systolic and diastolic heart failure due to valvular disease ([place])   7. Coronary artery disease of native artery of native heart with stable angina pectoris ([place])    PLAN:    In order of problems listed above:  1. Unfortunately despite addressing his sinus node dysfunction with pacemaker he is unimproved.  I will ask him to resume amiodarone as he does not go back and atrial fibrillation continue his anticoagulant provide is not having bleeding from his renal cancer and consider reducing his dose next visit. 2. Heart failure is compensated he reduce his diuretic to once daily unless his weight is 152 and then twice a day.  Next visit needs labs BMP proBNP 3. Stable CAD unfortunately not a candidate for further revascularization 4. His renal cell cancer is followed by urology 5. His severe PAD is followed by vascular surgery and I do not think he is a candidate for any elective revascularization   Next appointment: 4 weeks   Medication Adjustments/Labs and Tests Ordered: Current medicines are reviewed at length with the patient today.  Concerns regarding medicines are outlined above.  No orders of the defined types were placed in this encounter.  Meds ordered this encounter  Medications  . furosemide (LASIX) 40 MG tablet    Sig: Take 1 tablet (40 mg total) by mouth daily.    Dispense:  90 tablet    Refill:  3  . amiodarone (PACERONE) 200 MG tablet    Sig: Take 2 tablets (400 mg total) by mouth daily.    Dispense:  180 tablet    Refill:  3    Chief Complaint  Patient presents with  . Follow-up     Recent pacemaker insertion     History of Present Illness:    Ian Cummings is a 87 y.o. male with a hx of complex cardiology history including ED remote CABG and acute coronary syndrome in 2021 July with coronary angiography showing both graft occlusion and native CAD reviewed by multiple interventional cardiologist not felt to be candidate for further revascularization paroxysmal atrial fibrillation maintaining sinus rhythm on amiodarone symptomatic bradycardia with sinus pauses chronic anticoagulation hypertensive heart disease with chronic combined systolic and diastolic heart failure mitral regurgitation CKD stage III and a renal mass highly suspicious for malignancy last seen by me 04/30/2020 and referred for permanent pacemaker.  He underwent permanent pacemaker insertion 05/21/2020 Compliance with diet, lifestyle and medications: Yes  Recent high-sensitivity troponin assay was normal 2 weeks ago at a 1.75 GFR 34 cc potassium 3.9 hemoglobin 12.0  Unfortunately despite a pacemaker is not doing well his predominant problem is claudication he now sleeps in a chair because his leg is better and he is having hip and flank pain is taking narcotics.  He finds himself washed out and fatigued he has been having episodes of rapid heartbeat off of his amiodarone.  His weights are stable he has very little appetite and has no fluid overload.  When to decrease his diuretics and weight titrate him discontinue amlodipine which  I think is a poor choice and see if we can optimize heart failure treatment next visit increasing Entresto and resume his amiodarone. Past Medical History:  Diagnosis Date  . AAA (abdominal aortic aneurysm) ([place])    2004, Pt stated not there now  . Anginal pain ([place])   . Anxiety   . Arthritis   . Atherosclerosis of native arteries of the extremities with intermittent claudication 12/04/2011   ABI .34 and .54 in 2012   . Atherosclerotic PVD with intermittent claudication ([place])  02/20/2014  . BPH (benign prostatic hypertrophy)   . Bradycardia 01/12/2019  . Bradycardia on ECG 11/11/2017  . CAD (coronary artery disease)   . Carotid artery disease ([place])    Right carotid stent 2013  . Carotid artery disease without cerebral infarction [facility])    S/p right carotid stent for asymptomatic disease  2013   . Chronic diastolic (congestive) heart failure ([place]) 09/10/2019  . Chronic kidney disease, stage 3a ([place]) 09/10/2019  . Claudication ([place])   . Colon polyps    adenomatous  . Complex sleep apnea syndrome 11/11/2017  . Conjunctival lesion 05/04/2014   Formatting of this note might be different from the original. left  . Coronary artery disease   . Coronary artery disease involving native coronary artery of native heart with unstable angina pectoris ([place]) 09/06/2012   CABG w LIMA to LAD, SVG to OM1-OM2, SVG to dx, SVG to PD/PL 1995,  Cypher stent x 2 SVG to RCA and Cypher stent to Circ Graft 05/13/07, stent of SVG to circ OM in [place] in July 2013  Cath 4/14 [facility] normal Left main, occluded mid LAD, occluded RCA SVG, occluded CFX, widely patent OM 1 SVG, occluded RCA SVG, occluded Diag 1 SVG, widely patent LAD LIMA graft;  Redo CABG Dr. Servando Snare  10/18/12 with   . Depression   . Essential hypertension 01/12/2019  . GERD (gastroesophageal reflux disease)   . Gout   . H/O hiatal hernia   . Hyperlipidemia   . Hypertension   . Hypertension, renal disease, stage 1-4 or unspecified chronic kidney disease   . Hypothyroidism   . Malnutrition of moderate degree 08/05/2015  . MI (myocardial infarction) ([place]) 4/14  . Mixed hyperlipidemia   . Neuropathy    Hx: of in B/L toes  . OSA on CPAP 02/17/2018  . PAD (peripheral artery disease) ([place]) 08/03/2015  . PAF (paroxysmal atrial fibrillation) ([place])   . Peripheral vascular disease ([place])    with claudication  . Pre-diabetes   . RBBB 01/12/2019  . s/p CABG   . Shoulder pain 01/27/2019  . Stroke ([place])   . TIA (transient ischemic attack)     Hx: of  . Unstable angina ([place]) 09/10/2019    Past Surgical History:  Procedure Laterality Date  . CARDIOVERSION N/A 02/22/2020   Procedure: CARDIOVERSION;  Surgeon: [name], MD;  Location: [place];  Service: Cardiovascular;  Laterality: N/A;  . CAROTID STENT INSERTION Right 2013   right at [facility]  . CATARACT EXTRACTION Bilateral    Hx: of both eyes  . COLONOSCOPY     Hx: of  . COLONOSCOPY    . CORONARY ANGIOPLASTY WITH STENT PLACEMENT  2010  . CORONARY ARTERY BYPASS GRAFT  1995  . CORONARY ARTERY BYPASS GRAFT N/A 10/18/2012   Procedure: REDO CORONARY ARTERY BYPASS GRAFTING (CABG);  Surgeon: Grace Isaac, MD;  Location: [place];  Service: Open Heart Surgery;  Laterality: N/A;  off pump times two using  endoscopically harvested left saphenous vein  . CORONARY STENT PLACEMENT  06/2016  . ENDARTERECTOMY FEMORAL Right 08/03/2015   Procedure: RIGHT FEMORAL ENDARTERECTOMY WITH PATCH ANGIOPLASTY;  Surgeon: Serafina Mitchell, MD;  Location: [place];  Service: Vascular;  Laterality: Right;  . FEMORAL-POPLITEAL BYPASS GRAFT Right 08/03/2015   Procedure: RIGHT FEMORAL-BELOW KNEE POPLITEAL ARTERY BYPASS GRAFT;  Surgeon: Serafina Mitchell, MD;  Location: [place];  Service: Vascular;  Laterality: Right;  . FOOT SURGERY Left   . FRACTURE SURGERY Left    Hx: of Left heel  . INGUINAL HERNIA REPAIR Bilateral    X 2   . INTRAOPERATIVE TRANSESOPHAGEAL ECHOCARDIOGRAM N/A 10/18/2012   Procedure: INTRAOPERATIVE TRANSESOPHAGEAL ECHOCARDIOGRAM;  Surgeon: Grace Isaac, MD;  Location: [place];  Service: Open Heart Surgery;  Laterality: N/A;  . LEFT HEART CATH AND CORS/GRAFTS ANGIOGRAPHY N/A 10/25/2019   Procedure: LEFT HEART CATH AND CORS/GRAFTS ANGIOGRAPHY;  Surgeon: Leonie Man, MD;  Location: [place] CV LAB;  Service: Cardiovascular;  Laterality: N/A;  . LEFT HEART CATHETERIZATION WITH CORONARY/GRAFT ANGIOGRAM N/A 02/02/2013   Procedure: LEFT HEART CATHETERIZATION WITH Beatrix Fetters;  Surgeon: Jacolyn Reedy, MD;  Location: [facility] CATH LAB;  Service: Cardiovascular;  Laterality: N/A;  . LOWER EXTREMITY ANGIOGRAM Right 06/27/2015   Procedure: Lower Extremity Angiogram;  Surgeon: Serafina Mitchell, MD;  Location: [place] CV LAB;  Service: Cardiovascular;  Laterality: Right;  . MASTOIDECTOMY  1933  . PACEMAKER IMPLANT N/A 05/21/2020   Procedure: PACEMAKER IMPLANT;  Surgeon: Vickie Epley, MD;  Location: [place] CV LAB;  Service: Cardiovascular;  Laterality: N/A;  . PERIPHERAL VASCULAR CATHETERIZATION N/A 01/23/2015   Procedure: Abdominal Aortogram;  Surgeon: Serafina Mitchell, MD;  Location: [place] CV LAB;  Service: Cardiovascular;  Laterality: N/A;  . PERIPHERAL VASCULAR CATHETERIZATION N/A 06/27/2015   Procedure: Abdominal Aortogram;  Surgeon: Serafina Mitchell, MD;  Location: [place] CV LAB;  Service: Cardiovascular;  Laterality: N/A;  . RETINAL DETACHMENT SURGERY Left    Hx: of left eye  . TEE WITHOUT CARDIOVERSION N/A 02/22/2020   Procedure: TRANSESOPHAGEAL ECHOCARDIOGRAM (TEE);  Surgeon: [name], MD;  Location: [place];  Service: Cardiovascular;  Laterality: N/A;  . TONSILLECTOMY      Current Medications: Current Meds  Medication Sig  . ACCU-CHEK AVIVA PLUS test strip   . allopurinol (ZYLOPRIM) 100 MG tablet Take 100 mg by mouth daily.   Marland Kitchen amiodarone (PACERONE) 200 MG tablet Take 2 tablets (400 mg total) by mouth daily.  . Blood Glucose Monitoring Suppl (ACCU-CHEK AVIVA PLUS) w/Device KIT   . clopidogrel (PLAVIX) 75 MG tablet Take 1 tablet (75 mg total) by mouth daily.  Marland Kitchen escitalopram (LEXAPRO) 10 MG tablet Take 10 mg by mouth daily.  . furosemide (LASIX) 40 MG tablet Take 1 tablet (40 mg total) by mouth daily.  Marland Kitchen HYDROcodone-acetaminophen (NORCO/VICODIN) 5-325 MG tablet Take 1 tablet by mouth every 4 (four) hours as needed for moderate pain.   . isosorbide mononitrate (IMDUR) 30 MG 24 hr tablet Take 30 mg by mouth daily.  .  lansoprazole (PREVACID) 30 MG capsule Take 30 mg by mouth daily.  Marland Kitchen levothyroxine (SYNTHROID, LEVOTHROID) 88 MCG tablet Take 88 mcg by mouth daily before breakfast.  . melatonin 5 MG TABS Take 5 mg by mouth at bedtime.  . multivitamin (RENA-VIT) TABS tablet Take 1 tablet by mouth daily.  . nitroGLYCERIN (NITROSTAT) 0.4 MG SL tablet Place 0.4 mg under the tongue every 5 (five) minutes as needed  for chest pain.   . rivaroxaban (XARELTO) 20 MG TABS tablet Take 1 tablet (20 mg total) by mouth daily with supper.  . rosuvastatin (CRESTOR) 40 MG tablet Take 40 mg by mouth daily.  . sacubitril-valsartan (ENTRESTO) 24-26 MG Take 1 tablet by mouth daily.  . [DISCONTINUED] amLODipine (NORVASC) 5 MG tablet Take 5 mg by mouth daily.  . [DISCONTINUED] furosemide (LASIX) 20 MG tablet TAKE 2 TABLETS BY MOUTH 2 TIMES DAILY.     Allergies:   Amoxicillin and Metoprolol   Social History   Socioeconomic History  . Marital status: Married    Spouse name: Not on file  . Number of children: 3  . Years of education: Not on file  . Highest education level: Not on file  Occupational History  . Occupation: retired  Tobacco Use  . Smoking status: Former Smoker    Types: Cigarettes    Quit date: 04/29/1975    Years since quitting: 45.1  . Smokeless tobacco: Never Used  Vaping Use  . Vaping Use: Never used  Substance and Sexual Activity  . Alcohol use: Yes    Alcohol/week: 3.0 standard drinks    Types: 3 Glasses of wine per week    Comment: 3 glasses of wine today  . Drug use: No  . Sexual activity: Not Currently  Other Topics Concern  . 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 There is no history of Colon cancer, Stomach cancer, Rectal cancer, Esophageal cancer, Liver disease, or Kidney disease. ROS:   Please see the history of present illness.    All other systems reviewed and are negative.  EKGs/Labs/Other Studies Reviewed:    The following studies were reviewed today:    Recent Labs: 09/10/2019: B Natriuretic Peptide 284.4 09/12/2019: Magnesium 1.9 04/10/2020: TSH 3.410 04/30/2020: NT-Pro BNP 412 05/16/2020: ALT 24; BUN 34; Creatinine, Ser 1.75; Hemoglobin 12.0; Platelets 167; Potassium 3.9; Sodium 140  Recent Lipid Panel    Component Value Date/Time   CHOL 121 01/23/2020 1040   TRIG 191 (H) 01/23/2020 1040   HDL 30 (L) 01/23/2020 1040   CHOLHDL 4.0 01/23/2020 1040   CHOLHDL 3.2 04/18/2009 0241   VLDL 23 04/18/2009 0241   LDLCALC 59 01/23/2020 1040    Physical Exam:    VS:  BP (!) 102/58   Pulse 72   Ht _0  (1.778 m)   Wt 157 lb (71.2 kg)   SpO2 99%   BMI 22.53 kg/m     Wt Readings from Last 3 Encounters:  05/30/20 157 lb (71.2 kg)  05/25/20 157 lb 8 oz (71.4 kg)  05/21/20 155 lb (70.3 kg)     GEN: He looks quite frail flat affect depressed mood well nourished, well developed in no acute distress HEENT: Normal NECK: No JVD; No carotid bruits LYMPHATICS: No lymphadenopathy CARDIAC: Soft S1 1-2 of 6 MR no S3 RRR, RESPIRATORY:  Clear to auscultation without rales, wheezing or rhonchi  ABDOMEN: Soft, non-tender, non-distended MUSCULOSKELETAL:  No  edema; No deformity  SKIN: Warm and dry NEUROLOGIC:  Alert and oriented x 3 PSYCHIATRIC:  Normal affect    Signed, Shirlee More, MD  05/30/2020 10:51 AM    [place]

## 2020-05-30 ENCOUNTER — Other Ambulatory Visit: Payer: Self-pay

## 2020-05-30 ENCOUNTER — Encounter: Payer: Self-pay | Admitting: Cardiology

## 2020-05-30 ENCOUNTER — Ambulatory Visit: Payer: Medicare HMO | Admitting: Cardiology

## 2020-05-30 VITALS — BP 102/58 | HR 72 | Ht 70.0 in | Wt 157.0 lb

## 2020-05-30 DIAGNOSIS — I480 Paroxysmal atrial fibrillation: Secondary | ICD-10-CM

## 2020-05-30 DIAGNOSIS — I25118 Atherosclerotic heart disease of native coronary artery with other forms of angina pectoris: Secondary | ICD-10-CM

## 2020-05-30 DIAGNOSIS — I5042 Chronic combined systolic (congestive) and diastolic (congestive) heart failure: Secondary | ICD-10-CM

## 2020-05-30 DIAGNOSIS — Z79899 Other long term (current) drug therapy: Secondary | ICD-10-CM

## 2020-05-30 DIAGNOSIS — Z950 Presence of cardiac pacemaker: Secondary | ICD-10-CM

## 2020-05-30 DIAGNOSIS — I38 Endocarditis, valve unspecified: Secondary | ICD-10-CM | POA: Diagnosis not present

## 2020-05-30 DIAGNOSIS — I495 Sick sinus syndrome: Secondary | ICD-10-CM

## 2020-05-30 DIAGNOSIS — Z7901 Long term (current) use of anticoagulants: Secondary | ICD-10-CM

## 2020-05-30 MED ORDER — AMIODARONE HCL 200 MG PO TABS: 400.0000 mg | ORAL_TABLET | Freq: Every day | ORAL | 3 refills | Status: DC

## 2020-05-30 MED ORDER — FUROSEMIDE 40 MG PO TABS: 40.0000 mg | ORAL_TABLET | Freq: Every day | ORAL | 3 refills | Status: DC

## 2020-05-30 NOTE — Patient Instructions (Signed)
Medication Instructions:  Your physician has recommended you make the following change in your medication:  DECREASE: Furosemide to 40 mg take one tablet by mouth daily. If you weigh more than 152 lbs or greater take an extra tablet in the evening.  RESTART: Amiodarone 400 mg per two tablets by mouth daily.  STOP: Amlodipine *If you need a refill on your cardiac medications before your next appointment, please call your pharmacy*   Lab Work: None If you have labs (blood work) drawn today and your tests are completely normal, you will receive your results only by: Marland Kitchen MyChart Message (if you have MyChart) OR . A paper copy in the mail If you have any lab test that is abnormal or we need to change your treatment, we will call you to review the results.   Testing/Procedures: NOne   Follow-Up: At [facility], you and your health needs are our priority.  As part of our continuing mission to provide you with exceptional heart care, we have created designated Provider Care Teams.  These Care Teams include your primary Cardiologist (physician) and Advanced Practice Providers (APPs -  Physician Assistants and Nurse Practitioners) who all work together to provide you with the care you need, when you need it.  We recommend signing up for the patient portal called "MyChart".  Sign up information is provided on this After Visit Summary.  MyChart is used to connect with patients for Virtual Visits (Telemedicine).  Patients are able to view lab/test results, encounter notes, upcoming appointments, etc.  Non-urgent messages can be sent to your provider as well.   To learn more about what you can do with MyChart, go to [email or web address].    Your next appointment:   4 week(s)  The format for your next appointment:   In Person  Provider:   Shirlee More, MD   Other Instructions

## 2020-06-05 ENCOUNTER — Other Ambulatory Visit: Payer: Self-pay

## 2020-06-05 ENCOUNTER — Ambulatory Visit
Admission: RE | Admit: 2020-06-05 | Discharge: 2020-06-05 | Disposition: A | Discharge status: Home / Self Care | Payer: Medicare HMO | Source: Ambulatory Visit | Attending: Cardiology | Admitting: Cardiology

## 2020-06-05 ENCOUNTER — Ambulatory Visit (INDEPENDENT_AMBULATORY_CARE_PROVIDER_SITE_OTHER): Payer: Medicare HMO | Admitting: Emergency Medicine

## 2020-06-05 DIAGNOSIS — Z955 Presence of coronary angioplasty implant and graft: Secondary | ICD-10-CM | POA: Diagnosis not present

## 2020-06-05 DIAGNOSIS — Z951 Presence of aortocoronary bypass graft: Secondary | ICD-10-CM | POA: Diagnosis not present

## 2020-06-05 DIAGNOSIS — Z45018 Encounter for adjustment and management of other part of cardiac pacemaker: Secondary | ICD-10-CM | POA: Diagnosis not present

## 2020-06-05 DIAGNOSIS — Z950 Presence of cardiac pacemaker: Secondary | ICD-10-CM

## 2020-06-05 DIAGNOSIS — I495 Sick sinus syndrome: Secondary | ICD-10-CM

## 2020-06-05 LAB — CUP PACEART INCLINIC DEVICE CHECK
Brady Statistic RA Percent Paced: 56 %
Brady Statistic RV Percent Paced: 10 %
Date Time Interrogation Session: 20220208113643
Implantable Lead Implant Date: 20220124
Implantable Lead Implant Date: 20220124
Implantable Lead Location: 753859
Implantable Lead Location: 753860
Implantable Lead Model: 377
Implantable Lead Model: 377
Implantable Lead Serial Number: 8000155840
Implantable Lead Serial Number: 8000166398
Implantable Pulse Generator Implant Date: 20220124
Lead Channel Impedance Value: 487 Ohm
Lead Channel Impedance Value: 643 Ohm
Lead Channel Pacing Threshold Amplitude: 0.8 V
Lead Channel Pacing Threshold Amplitude: 2.4 V
Lead Channel Pacing Threshold Pulse Width: 0.4 ms
Lead Channel Pacing Threshold Pulse Width: 0.4 ms
Lead Channel Sensing Intrinsic Amplitude: 1.9 mV
Lead Channel Sensing Intrinsic Amplitude: 8.5 mV
Lead Channel Setting Pacing Amplitude: 3 V
Lead Channel Setting Pacing Amplitude: 3 V
Lead Channel Setting Pacing Pulse Width: 0.4 ms
Pulse Gen Model: 407145
Pulse Gen Serial Number: 70002016

## 2020-06-05 NOTE — Progress Notes (Signed)
Wound check appointment. Steri-strips removed. Wound without redness or edema. Incision edges approximated, wound well healed. Normal device function. RV threshold, sensing, and impedances consistent with implant measurements. RA threshold was 2.4 V @ 0.4 ms. CXR ordered per Dr. Quentin Ore. Device programmed at 3.0V/auto capture programmed on for extra safety margin until 3 month visit. Histogram distribution appropriate for patient and level of activity. CLS increased to "High" per Dr. Quentin Ore No mode switches or high ventricular rates noted. Patient educated about wound care, arm mobility, lifting restrictions. ROV in 3 months with Dr. Quentin Ore. Next remote 08/11/20.

## 2020-06-07 ENCOUNTER — Telehealth: Payer: Self-pay | Admitting: Cardiology

## 2020-06-07 NOTE — Telephone Encounter (Signed)
Attempted to call the patient. No answer- I left a message to please call back.  

## 2020-06-07 NOTE — Telephone Encounter (Signed)
Vickie Epley, MD  06/07/2020 2:56 PM EST      I would recommend we stop the amiodarone that was just started. Dr Bettina Gavia was concerned that Ian Cummings was having AF but the pacemaker shows no arrhythmias.  I suspect the amiodarone caused the slight increase in pacing threshold on both leads and may be contributing to his lightheadedness. There was no atrial fibrillation detected on his pacemaker interrogation so I don't think the amiodarone is necessary at this point. If he has an increase in AF burden over time, can consider restarting the medication.  I'd be happy to discuss in person / telephone visit if that would be beneficial.   Ian Cummings/Ian Cummings can you let him know? Thx, CL

## 2020-06-08 NOTE — Telephone Encounter (Signed)
I spoke with the patient regarding Dr. Mardene Speak recommendations to stop amiodarone based on his most recent transmission that was received for his PPM.  The patient voices understanding and is agreeable with stopping this.   He did not feel he needed a sooner discussion with Dr. Quentin Ore about this.  He did want Dr. Quentin Ore to know that the last adjustment that was made on his device has helped him feel so much better and that he is doing the things he wants to do like cooking.  He asked that I pass that on to Dr. Quentin Ore as he was so grateful.   The patient is aware I will remove the amiodarone from his medication list.   Will forward to Dr. Bettina Gavia as well as an Juluis Rainier- he is scheduled to see the patient on 06/29/20.

## 2020-06-08 NOTE — Telephone Encounter (Signed)
Patient returning call for results 

## 2020-06-12 ENCOUNTER — Telehealth: Payer: Self-pay | Admitting: Cardiology

## 2020-06-12 NOTE — Telephone Encounter (Signed)
    Pt c/o medication issue:  1. Name of Medication: amiodarone   2. How are you currently taking this medication (dosage and times per day)?   3. Are you having a reaction (difficulty breathing--STAT)?   4. What is your medication issue? Pt said he received prescription in the mail for amiodarone and it says to take 2 a day. He said Dr. Gerarda Fraction discontinued this medication for him, he wanted to check if Dr. Bettina Gavia wants him on it again.

## 2020-06-12 NOTE — Telephone Encounter (Signed)
Spoke to the patient just now and he let me know that he was instructed by Dr. Quentin Ore to discontinue his amiodarone. He is wanting to know if this is ok with Dr. Bettina Gavia as well.   I will route to Dr. Bettina Gavia at this time.

## 2020-06-13 MED ORDER — AMIODARONE HCL 200 MG PO TABS: 200.0000 mg | ORAL_TABLET | Freq: Every day | ORAL | 1 refills | Status: DC

## 2020-06-13 NOTE — Telephone Encounter (Signed)
Patient returning call.

## 2020-06-13 NOTE — Telephone Encounter (Signed)
Left message on patients voicemail to please return our call.   

## 2020-06-13 NOTE — Telephone Encounter (Signed)
Returned patient call. Advised of instruction of continuing Amiodarone 200mg  a day per Dr. Bettina Gavia. Rx sent to the pharmacy. Patient indicates he has 400mg  on hand, I advised to cut the pill in half until he run out, then an available script will be sent with new instructions. Patient verbalized understanding.

## 2020-06-13 NOTE — Addendum Note (Signed)
Addended by: Darrel Reach on: 06/13/2020 10:54 AM   Modules accepted: Orders

## 2020-06-13 NOTE — Telephone Encounter (Signed)
Continue amiodarone 200mg  a day

## 2020-06-19 ENCOUNTER — Other Ambulatory Visit: Payer: Self-pay

## 2020-06-19 ENCOUNTER — Telehealth: Payer: Self-pay | Admitting: Cardiology

## 2020-06-19 MED ORDER — ENTRESTO 24-26 MG PO TABS: 1.0000 | ORAL_TABLET | Freq: Every day | ORAL | 3 refills | Status: DC

## 2020-06-19 NOTE — Telephone Encounter (Signed)
Spoke to [place] just now. She is a pharmacist that is going over the patients medications with him. She had many questions as the patient was very confused with his medication list. I went over our medication list with the patient and Yvetta Coder and they verbalizes understanding.    Encouraged patient to call back with any questions or concerns.

## 2020-06-19 NOTE — Telephone Encounter (Signed)
New Message:     Ian Cummings from [facility] need you to call her asap about this pt. The pt is in the office at this time

## 2020-06-19 NOTE — Telephone Encounter (Signed)
Left message on Tanisha's voicemail to please return our call.

## 2020-06-22 ENCOUNTER — Telehealth: Payer: Self-pay | Admitting: Cardiology

## 2020-06-22 NOTE — Telephone Encounter (Signed)
    Pt c/o medication issue:  1. Name of Medication:   amiodarone (PACERONE) 200 MG tablet    2. How are you currently taking this medication (dosage and times per day)? Take 1 tablet (200 mg total) by mouth daily.  3. Are you having a reaction (difficulty breathing--STAT)?   4. What is your medication issue? Pt said he is now taking his correct meds. Today after taking amiodarone his BP went up to 165/77. He wanted to speak with Dr. Joya Gaskins nurse want he needs to do if his BP gets high

## 2020-06-22 NOTE — Telephone Encounter (Signed)
Spoke to the patient just now and he verbalized to me that he took his blood pressure before his medications this morning. This reading was 165/77. I asked him when he took his morning medications and he verbalized to me that he took them about one hour ago. Patient rechecked his blood pressure at this time per my request and it is now at 122/67 HR is 93. I advised that he should take his blood pressure after his morning medications and after about 30 minutes of rest. He verbalizes understanding and thanks me for calling him back.

## 2020-06-25 DIAGNOSIS — K219 Gastro-esophageal reflux disease without esophagitis: Secondary | ICD-10-CM | POA: Diagnosis not present

## 2020-06-25 DIAGNOSIS — I129 Hypertensive chronic kidney disease with stage 1 through stage 4 chronic kidney disease, or unspecified chronic kidney disease: Secondary | ICD-10-CM | POA: Diagnosis not present

## 2020-06-25 DIAGNOSIS — M109 Gout, unspecified: Secondary | ICD-10-CM | POA: Diagnosis not present

## 2020-06-25 DIAGNOSIS — I10 Essential (primary) hypertension: Secondary | ICD-10-CM | POA: Diagnosis not present

## 2020-06-25 DIAGNOSIS — I480 Paroxysmal atrial fibrillation: Secondary | ICD-10-CM | POA: Diagnosis not present

## 2020-06-25 DIAGNOSIS — F419 Anxiety disorder, unspecified: Secondary | ICD-10-CM | POA: Diagnosis not present

## 2020-06-25 DIAGNOSIS — E039 Hypothyroidism, unspecified: Secondary | ICD-10-CM | POA: Diagnosis not present

## 2020-06-25 DIAGNOSIS — E785 Hyperlipidemia, unspecified: Secondary | ICD-10-CM | POA: Diagnosis not present

## 2020-06-25 DIAGNOSIS — I5022 Chronic systolic (congestive) heart failure: Secondary | ICD-10-CM | POA: Diagnosis not present

## 2020-06-29 ENCOUNTER — Ambulatory Visit: Payer: Medicare HMO | Admitting: Cardiology

## 2020-06-29 DIAGNOSIS — R3912 Poor urinary stream: Secondary | ICD-10-CM | POA: Diagnosis not present

## 2020-06-29 DIAGNOSIS — N401 Enlarged prostate with lower urinary tract symptoms: Secondary | ICD-10-CM | POA: Diagnosis not present

## 2020-07-13 DIAGNOSIS — M25812 Other specified joint disorders, left shoulder: Secondary | ICD-10-CM | POA: Diagnosis not present

## 2020-07-17 DIAGNOSIS — L821 Other seborrheic keratosis: Secondary | ICD-10-CM | POA: Diagnosis not present

## 2020-07-17 DIAGNOSIS — L570 Actinic keratosis: Secondary | ICD-10-CM | POA: Diagnosis not present

## 2020-07-23 ENCOUNTER — Telehealth: Payer: Self-pay

## 2020-07-23 NOTE — Telephone Encounter (Signed)
Patient called to report dizziness upon standing and 2 episodes of blurry double vision last week after shopping. He says he is "sure that it is not a mini stroke" but he would like to be checked out. Possibly a BP/cardiac issue - and he does have a follow up with his cardiologist this Wednesday. His last scan at the end of January showed 60-79% stenosis in the L ICA. Added him on for a carotid duplex and follow up visit. We discussed s/s of stroke and he knows to call 911. He will keep his ABI visit at the end of next month.

## 2020-07-24 NOTE — Progress Notes (Signed)
Cardiology Office Note:    Date:  07/25/2020   ID:  Ian Cummings, DOB 1928/09/11, MRN [id number]  PCP:  Ginger Organ., MD  Cardiologist:  Shirlee More, MD    Referring MD: Ginger Organ., MD    ASSESSMENT:    1. Paroxysmal atrial fibrillation (HCC)   2. Shortness of breath   3. On amiodarone therapy   4. Chronic anticoagulation   5. 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 Overall doing better in terms of quality of life maintaining sinus rhythm on low-dose amiodarone and tolerating his combined antiplatelet and anticoagulant therapy. 2. Stable pacemaker function followed by device clinic he said his splint made a remarkable difference in the quality of his life 3. Heart failure is improved he has no fluid overload but poorly tolerant of vasodilator I think fracturing the seal cut with [name] is giving him symptoms of hypoperfusion he will take the coated tablet 2426 and will reduce to 1 every other day.  The goal here is to improve the quality of his life 4. He asked me my opinion about surgery from presumed renal cancer and I told him I think it be a mistake with his comorbidities   Next appointment: 3 months   Medication Adjustments/Labs and Tests Ordered: Current medicines are reviewed at length with the patient today.  Concerns regarding medicines are outlined above.  Orders Placed This Encounter  Procedures  . Basic metabolic panel  . Pro b natriuretic peptide (BNP)  . EKG 12-Lead   Meds ordered this encounter  Medications  . sacubitril-valsartan (ENTRESTO) 24-26 MG    Sig: Take 1 tablet by mouth every other day.    Dispense:  90 tablet    Refill:  3    No chief complaint on file.   History of Present Illness:    Ian Cummings is a 87 y.o. male with a hx of complex cardiac disease including CAD with remote CABG, acute coronary syndrome in July 2021  with both graft occlusion and native CAD not amenable to further revascularization, paroxysmal atrial fibrillation maintaining sinus rhythm on amiodarone, symptomatic bradycardia with sinus pauses and permanent pacemaker chronic anticoagulation hypertensive heart disease with chronic combined systolic and diastolic heart failure functional mitral regurgitation stage III CKD and a renal mass highly suspicious for malignancy.  He was last seen 05/30/2020 and restarted on amiodarone.  At that time he is predominantly limited by claudication and was taking chronic narcotic therapy.  Compliance with diet, lifestyle and medications: Yes   Unfortunately has been breaking in half higher strength Entresto and taking it once daily and may be getting rapid absorption symptoms of hypoperfusion.  Overall doing better has more better than bad days no angina palpitation or syncope but spirit is better He was seen by vascular surgery today with complaints of orthostatic lightheadedness and diplopia.  Carotid duplex January showed patent right ICA stent with an increase on the left side of velocities now in the 60 to 79% stenosis range.  She was not felt to require further intervention. Past Medical History:  Diagnosis Date  . AAA (abdominal aortic aneurysm) ([place])    2004, Pt stated not there now  . Anginal pain ([place])   . Anxiety   . Arthritis   . Atherosclerosis of native arteries of the extremities with intermittent claudication 12/04/2011   ABI .[place]  and .37 in 2012   . Atherosclerotic PVD with intermittent claudication ([place]) 02/20/2014  . BPH (benign prostatic hypertrophy)   . Bradycardia 01/12/2019  . Bradycardia on ECG 11/11/2017  . CAD (coronary artery disease)   . Carotid artery disease ([place])    Right carotid stent 2013  . Carotid artery disease without cerebral infarction [facility])    S/p right carotid stent for asymptomatic disease  2013   . Chronic diastolic (congestive) heart failure ([place]) 09/10/2019  .  Chronic kidney disease, stage 3a ([place]) 09/10/2019  . Claudication ([place])   . Colon polyps    adenomatous  . Complex sleep apnea syndrome 11/11/2017  . Conjunctival lesion 05/04/2014   Formatting of this note might be different from the original. left  . Coronary artery disease   . Coronary artery disease involving native coronary artery of native heart with unstable angina pectoris ([place]) 09/06/2012   CABG w LIMA to LAD, SVG to OM1-OM2, SVG to dx, SVG to PD/PL 1995,  Cypher stent x 2 SVG to RCA and Cypher stent to Circ Graft 05/13/07, stent of SVG to circ OM in [place] in July 2013  Cath 4/14 [facility] normal Left main, occluded mid LAD, occluded RCA SVG, occluded CFX, widely patent OM 1 SVG, occluded RCA SVG, occluded Diag 1 SVG, widely patent LAD LIMA graft;  Redo CABG Dr. Servando Snare  10/18/12 with   . Depression   . Essential hypertension 01/12/2019  . GERD (gastroesophageal reflux disease)   . Gout   . H/O hiatal hernia   . Hyperlipidemia   . Hypertension   . Hypertension, renal disease, stage 1-4 or unspecified chronic kidney disease   . Hypothyroidism   . Malnutrition of moderate degree 08/05/2015  . MI (myocardial infarction) ([place]) 4/14  . Mixed hyperlipidemia   . Neuropathy    Hx: of in B/L toes  . OSA on CPAP 02/17/2018  . PAD (peripheral artery disease) ([place]) 08/03/2015  . PAF (paroxysmal atrial fibrillation) ([place])   . Peripheral vascular disease ([place])    with claudication  . Pre-diabetes   . RBBB 01/12/2019  . s/p CABG   . Shoulder pain 01/27/2019  . Stroke ([place])   . TIA (transient ischemic attack)    Hx: of  . Unstable angina ([place]) 09/10/2019    Past Surgical History:  Procedure Laterality Date  . CARDIOVERSION N/A 02/22/2020   Procedure: CARDIOVERSION;  Surgeon: [name], MD;  Location: [place];  Service: Cardiovascular;  Laterality: N/A;  . CAROTID STENT INSERTION Right 2013   right at [facility]  . CATARACT EXTRACTION Bilateral    Hx: of both eyes  .  COLONOSCOPY     Hx: of  . COLONOSCOPY    . CORONARY ANGIOPLASTY WITH STENT PLACEMENT  2010  . CORONARY ARTERY BYPASS GRAFT  1995  . CORONARY ARTERY BYPASS GRAFT N/A 10/18/2012   Procedure: REDO CORONARY ARTERY BYPASS GRAFTING (CABG);  Surgeon: Grace Isaac, MD;  Location: [place];  Service: Open Heart Surgery;  Laterality: N/A;  off pump times two using endoscopically harvested left saphenous vein  . CORONARY STENT PLACEMENT  06/2016  . ENDARTERECTOMY FEMORAL Right 08/03/2015   Procedure: RIGHT FEMORAL ENDARTERECTOMY WITH PATCH ANGIOPLASTY;  Surgeon: Serafina Mitchell, MD;  Location: [place];  Service: Vascular;  Laterality: Right;  . FEMORAL-POPLITEAL BYPASS GRAFT Right 08/03/2015   Procedure: RIGHT FEMORAL-BELOW KNEE POPLITEAL ARTERY BYPASS GRAFT;  Surgeon: Serafina Mitchell, MD;  Location: [place];  Service: Vascular;  Laterality: Right;  .  FOOT SURGERY Left   . FRACTURE SURGERY Left    Hx: of Left heel  . INGUINAL HERNIA REPAIR Bilateral    X 2   . INTRAOPERATIVE TRANSESOPHAGEAL ECHOCARDIOGRAM N/A 10/18/2012   Procedure: INTRAOPERATIVE TRANSESOPHAGEAL ECHOCARDIOGRAM;  Surgeon: Grace Isaac, MD;  Location: [place];  Service: Open Heart Surgery;  Laterality: N/A;  . LEFT HEART CATH AND CORS/GRAFTS ANGIOGRAPHY N/A 10/25/2019   Procedure: LEFT HEART CATH AND CORS/GRAFTS ANGIOGRAPHY;  Surgeon: Leonie Man, MD;  Location: [place] CV LAB;  Service: Cardiovascular;  Laterality: N/A;  . LEFT HEART CATHETERIZATION WITH CORONARY/GRAFT ANGIOGRAM N/A 02/02/2013   Procedure: LEFT HEART CATHETERIZATION WITH Beatrix Fetters;  Surgeon: Jacolyn Reedy, MD;  Location[facility] C[facility] LAB;  Service: Cardiovascular;  Laterality: N/A;  . LOWER EXTREMITY ANGIOGRAM Right 06/27/2015   Procedure: Lower Extremity Angiogram;  Surgeon: Serafina Mitchell, MD;  Location: [place] CV LAB;  Service: Cardiovascular;  Laterality: Right;  . MASTOIDECTOMY  1933  . PACEMAKER IMPLANT N/A 05/21/2020   Procedure: PACEMAKER  IMPLANT;  Surgeon: Vickie Epley, MD;  Location: [place] CV LAB;  Service: Cardiovascular;  Laterality: N/A;  . PERIPHERAL VASCULAR CATHETERIZATION N/A 01/23/2015   Procedure: Abdominal Aortogram;  Surgeon: Serafina Mitchell, MD;  Location: [place] CV LAB;  Service: Cardiovascular;  Laterality: N/A;  . PERIPHERAL VASCULAR CATHETERIZATION N/A 06/27/2015   Procedure: Abdominal Aortogram;  Surgeon: Serafina Mitchell, MD;  Location: [place] CV LAB;  Service: Cardiovascular;  Laterality: N/A;  . RETINAL DETACHMENT SURGERY Left    Hx: of left eye  . TEE WITHOUT CARDIOVERSION N/A 02/22/2020   Procedure: TRANSESOPHAGEAL ECHOCARDIOGRAM (TEE);  Surgeon: [name], MD;  Location: [place];  Service: Cardiovascular;  Laterality: N/A;  . TONSILLECTOMY      Current Medications: Current Meds  Medication Sig  . ACCU-CHEK AVIVA PLUS test strip   . allopurinol (ZYLOPRIM) 100 MG tablet Take 100 mg by mouth daily.   Marland Kitchen amiodarone (PACERONE) 200 MG tablet Take 1 tablet (200 mg total) by mouth daily.  . Blood Glucose Monitoring Suppl (ACCU-CHEK AVIVA PLUS) w/Device KIT   . clopidogrel (PLAVIX) 75 MG tablet Take 1 tablet (75 mg total) by mouth daily.  Marland Kitchen escitalopram (LEXAPRO) 10 MG tablet Take 10 mg by mouth daily.  . furosemide (LASIX) 20 MG tablet   . HYDROcodone-acetaminophen (NORCO/VICODIN) 5-325 MG tablet Take 1 tablet by mouth every 4 (four) hours as needed for moderate pain.   . isosorbide mononitrate (IMDUR) 30 MG 24 hr tablet Take 30 mg by mouth daily.  . lansoprazole (PREVACID) 30 MG capsule Take 30 mg by mouth daily.  Marland Kitchen levothyroxine (SYNTHROID, LEVOTHROID) 88 MCG tablet Take 88 mcg by mouth daily before breakfast.  . melatonin 5 MG TABS Take 5 mg by mouth at bedtime.  . metoprolol succinate (TOPROL-XL) 50 MG 24 hr tablet Take by mouth.  . multivitamin (RENA-VIT) TABS tablet Take 1 tablet by mouth daily.  . nitroGLYCERIN (NITROSTAT) 0.4 MG SL tablet Place 0.4 mg under the tongue  every 5 (five) minutes as needed for chest pain.   . rivaroxaban (XARELTO) 20 MG TABS tablet Take 1 tablet (20 mg total) by mouth daily with supper.  . rosuvastatin (CRESTOR) 40 MG tablet Take 40 mg by mouth daily.  . sacubitril-valsartan (ENTRESTO) 24-26 MG Take 1 tablet by mouth every other day.  . [DISCONTINUED] sacubitril-valsartan (ENTRESTO) 24-26 MG Take 1 tablet by mouth daily.     Allergies:   Amoxicillin and Metoprolol  Social History   Socioeconomic History  . Marital status: Married    Spouse name: Not on file  . Number of children: 3  . Years of education: Not on file  . Highest education level: Not on file  Occupational History  . Occupation: retired  Tobacco Use  . Smoking status: Former Smoker    Types: Cigarettes    Quit date: 04/29/1975    Years since quitting: 45.2  . Smokeless tobacco: Never Used  Vaping Use  . Vaping Use: Never used  Substance and Sexual Activity  . Alcohol use: Yes    Alcohol/week: 3.0 standard drinks    Types: 3 Glasses of wine per week    Comment: 3 glasses of wine today  . Drug use: No  . Sexual activity: Not Currently  Other Topics Concern  . 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 There is no history of Colon cancer, Stomach cancer, Rectal cancer, Esophageal cancer, Liver disease, or Kidney disease. ROS:   Please see the history of present illness.    All other systems reviewed and are negative.  EKGs/Labs/Other Studies Reviewed:    The  following studies were reviewed today:  EKG:  EKG ordered today and personally reviewed.  The ekg ordered today demonstrates atrial paced rhythm 60 bpm  Recent Labs: 09/10/2019: B Natriuretic Peptide 284.4 09/12/2019: Magnesium 1.9 04/10/2020: TSH 3.410 04/30/2020: NT-Pro BNP 412 05/16/2020: ALT 24; BUN 34; Creatinine, Ser 1.75; Hemoglobin 12.0; Platelets 167; Potassium 3.9; Sodium 140  Recent Lipid Panel    Component Value Date/Time   CHOL 121 01/23/2020 1040   TRIG 191 (H) 01/23/2020 1040   HDL 30 (L) 01/23/2020 1040   CHOLHDL 4.0 01/23/2020 1040   CHOLHDL 3.2 04/18/2009 0241   VLDL 23 04/18/2009 0241   LDLCALC 59 01/23/2020 1040    Physical Exam:    VS:  BP (!) 94/56 (BP Location: Left Arm, Patient Position: Sitting)   Pulse 68   Ht 5' 10"  (1.778 m)   Wt 153 lb 1.9 oz (69.5 kg)   BMI 21.97 kg/m     Wt Readings from Last 3 Encounters:  07/25/20 153 lb 1.9 oz (69.5 kg)  07/25/20 153 lb (69.4 kg)  05/30/20 157 lb (71.2 kg)     GEN:  Well nourished, well developed in no acute distress HEENT: Normal NECK: No JVD; No carotid bruits LYMPHATICS: No lymphadenopathy CARDIAC: RRR, no murmurs, rubs, gallops RESPIRATORY:  Clear to auscultation without rales, wheezing or rhonchi  ABDOMEN: Soft, non-tender, non-distended MUSCULOSKELETAL:  No edema; No deformity  SKIN: Warm and dry NEUROLOGIC:  Alert and oriented x 3 PSYCHIATRIC:  Normal affect    Signed, Shirlee More, MD  07/25/2020 3:51 PM    [facility]

## 2020-07-25 ENCOUNTER — Ambulatory Visit: Payer: Medicare HMO | Admitting: Cardiology

## 2020-07-25 ENCOUNTER — Ambulatory Visit: Payer: Medicare HMO | Admitting: Physician Assistant

## 2020-07-25 ENCOUNTER — Ambulatory Visit (HOSPITAL_COMMUNITY)
Admission: RE | Admit: 2020-07-25 | Discharge: 2020-07-25 | Disposition: A | Discharge status: Home / Self Care | Payer: Medicare HMO | Source: Ambulatory Visit | Attending: Physician Assistant | Admitting: Physician Assistant

## 2020-07-25 ENCOUNTER — Encounter: Payer: Self-pay | Admitting: Cardiology

## 2020-07-25 ENCOUNTER — Other Ambulatory Visit: Payer: Self-pay

## 2020-07-25 VITALS — BP 105/64 | HR 70 | Temp 98.1°F | Resp 20 | Ht 70.0 in | Wt 153.0 lb

## 2020-07-25 VITALS — BP 94/56 | HR 68 | Ht 70.0 in | Wt 153.1 lb

## 2020-07-25 DIAGNOSIS — Z79899 Other long term (current) drug therapy: Secondary | ICD-10-CM

## 2020-07-25 DIAGNOSIS — R0602 Shortness of breath: Secondary | ICD-10-CM

## 2020-07-25 DIAGNOSIS — I5042 Chronic combined systolic (congestive) and diastolic (congestive) heart failure: Secondary | ICD-10-CM

## 2020-07-25 DIAGNOSIS — I6522 Occlusion and stenosis of left carotid artery: Secondary | ICD-10-CM

## 2020-07-25 DIAGNOSIS — I480 Paroxysmal atrial fibrillation: Secondary | ICD-10-CM | POA: Diagnosis not present

## 2020-07-25 DIAGNOSIS — I6523 Occlusion and stenosis of bilateral carotid arteries: Secondary | ICD-10-CM

## 2020-07-25 DIAGNOSIS — Z7901 Long term (current) use of anticoagulants: Secondary | ICD-10-CM

## 2020-07-25 DIAGNOSIS — H532 Diplopia: Secondary | ICD-10-CM

## 2020-07-25 DIAGNOSIS — I4819 Other persistent atrial fibrillation: Secondary | ICD-10-CM | POA: Diagnosis not present

## 2020-07-25 DIAGNOSIS — I10 Essential (primary) hypertension: Secondary | ICD-10-CM | POA: Diagnosis not present

## 2020-07-25 DIAGNOSIS — I38 Endocarditis, valve unspecified: Secondary | ICD-10-CM | POA: Diagnosis not present

## 2020-07-25 MED ORDER — SACUBITRIL-VALSARTAN 24-26 MG PO TABS: 1.0000 | ORAL_TABLET | ORAL | 3 refills | Status: DC

## 2020-07-25 NOTE — Progress Notes (Signed)
Office Note     CC:  follow up Requesting Provider:  Ginger Organ., MD  HPI: Ian Cummings is a 87 y.o. (1928-11-10) male who presents with an episode of double vision that he describes as "vertical double vision."  He states put one hand over his eye and the vision would correct.  He denies monocular blindness.  In addition he denies syncopal episodes, hearing disturbances, upper extremity numbness or weakness, denies slurred speech or difficulty swallowing.  Denies facial drooping.  He denies upper extremity claudication.  He states he is getting lightheaded when going from sitting to standing.  At his last visit, he was counseled regarding blood pressure management and encouraged to follow-up with his cardiologist.  He sees Dr. Steward Ros this afternoon to address this as onset was after his pacemaker insertion.  He has history of right carotid stent performed at [facility] in 2013. He is also followed by Dr. Steward Ros for PAF, CAD, diastolic HF, and mitral valve regurgitation. His CAD is being managed medically  The patient was last seen here in our [place] clinic for follow-up of peripheral arterial disease and carotid artery stneosis.  His neurologic review of systems was completed and he had no symptoms with the exception of some lightheadedness after having recent pacemaker implantation on May 21, 2020. His carotid duplex today shows patent right ICA stent. The left ICA has increased velocities from prior study and is now in the 60-79% stenosis range. He remains without symptoms at this time. We will repeat the duplex in 6 months.   The pt is on a statin for cholesterol management.  The pt is not on a daily aspirin.   Other AC: Xarelto Plavix The pt is on CCB, diuretic and Entresto for hypertension.   The pt is not diabetic.  Tobacco hx:  Former smoker, quit 1977   Past Medical History:  Diagnosis Date  . AAA (abdominal aortic aneurysm) ([place])    2004, Pt stated not there now  .  Anginal pain ([place])   . Anxiety   . Arthritis   . Atherosclerosis of native arteries of the extremities with intermittent claudication 12/04/2011   ABI .20 and .54 in 2012   . Atherosclerotic PVD with intermittent claudication ([place]) 02/20/2014  . BPH (benign prostatic hypertrophy)   . Bradycardia 01/12/2019  . Bradycardia on ECG 11/11/2017  . CAD (coronary artery disease)   . Carotid artery disease ([place])    Right carotid stent 2013  . Carotid artery disease without cerebral infarction [facility])    S/p right carotid stent for asymptomatic disease  2013   . Chronic diastolic (congestive) heart failure ([place]) 09/10/2019  . Chronic kidney disease, stage 3a ([place]) 09/10/2019  . Claudication ([place])   . Colon polyps    adenomatous  . Complex sleep apnea syndrome 11/11/2017  . Conjunctival lesion 05/04/2014   Formatting of this note might be different from the original. left  . Coronary artery disease   . Coronary artery disease involving native coronary artery of native heart with unstable angina pectoris ([place]) 09/06/2012   CABG w LIMA to LAD, SVG to OM1-OM2, SVG to dx, SVG to PD/PL 1995,  Cypher stent x 2 SVG to RCA and Cypher stent to Circ Graft 05/13/07, stent of SVG to circ OM in [place] in July 2013  Cath 4/14 [facility] normal Left main, occluded mid LAD, occluded RCA SVG, occluded CFX, widely patent OM 1 SVG, occluded RCA SVG, occluded Diag 1 SVG, widely patent  LAD LIMA graft;  Redo CABG Dr. Servando Snare  10/18/12 with   . Depression   . Essential hypertension 01/12/2019  . GERD (gastroesophageal reflux disease)   . Gout   . H/O hiatal hernia   . Hyperlipidemia   . Hypertension   . Hypertension, renal disease, stage 1-4 or unspecified chronic kidney disease   . Hypothyroidism   . Malnutrition of moderate degree 08/05/2015  . MI (myocardial infarction) ([place]) 4/14  . Mixed hyperlipidemia   . Neuropathy    Hx: of in B/L toes  . OSA on CPAP 02/17/2018  . PAD (peripheral artery disease) ([place]) 08/03/2015  . PAF  (paroxysmal atrial fibrillation) ([place])   . Peripheral vascular disease ([place])    with claudication  . Pre-diabetes   . RBBB 01/12/2019  . s/p CABG   . Shoulder pain 01/27/2019  . Stroke ([place])   . TIA (transient ischemic attack)    Hx: of  . Unstable angina ([place]) 09/10/2019    Past Surgical History:  Procedure Laterality Date  . CARDIOVERSION N/A 02/22/2020   Procedure: CARDIOVERSION;  Surgeon: [name], MD;  Location: [place];  Service: Cardiovascular;  Laterality: N/A;  . CAROTID STENT INSERTION Right 2013   right at [facility]  . CATARACT EXTRACTION Bilateral    Hx: of both eyes  . COLONOSCOPY     Hx: of  . COLONOSCOPY    . CORONARY ANGIOPLASTY WITH STENT PLACEMENT  2010  . CORONARY ARTERY BYPASS GRAFT  1995  . CORONARY ARTERY BYPASS GRAFT N/A 10/18/2012   Procedure: REDO CORONARY ARTERY BYPASS GRAFTING (CABG);  Surgeon: Grace Isaac, MD;  Location: [place];  Service: Open Heart Surgery;  Laterality: N/A;  off pump times two using endoscopically harvested left saphenous vein  . CORONARY STENT PLACEMENT  06/2016  . ENDARTERECTOMY FEMORAL Right 08/03/2015   Procedure: RIGHT FEMORAL ENDARTERECTOMY WITH PATCH ANGIOPLASTY;  Surgeon: Serafina Mitchell, MD;  Location: [place];  Service: Vascular;  Laterality: Right;  . FEMORAL-POPLITEAL BYPASS GRAFT Right 08/03/2015   Procedure: RIGHT FEMORAL-BELOW KNEE POPLITEAL ARTERY BYPASS GRAFT;  Surgeon: Serafina Mitchell, MD;  Location: [place];  Service: Vascular;  Laterality: Right;  . FOOT SURGERY Left   . FRACTURE SURGERY Left    Hx: of Left heel  . INGUINAL HERNIA REPAIR Bilateral    X 2   . INTRAOPERATIVE TRANSESOPHAGEAL ECHOCARDIOGRAM N/A 10/18/2012   Procedure: INTRAOPERATIVE TRANSESOPHAGEAL ECHOCARDIOGRAM;  Surgeon: Grace Isaac, MD;  Location: [place];  Service: Open Heart Surgery;  Laterality: N/A;  . LEFT HEART CATH AND CORS/GRAFTS ANGIOGRAPHY N/A 10/25/2019   Procedure: LEFT HEART CATH AND CORS/GRAFTS ANGIOGRAPHY;   Surgeon: Leonie Man, MD;  Location: [place] CV LAB;  Service: Cardiovascular;  Laterality: N/A;  . LEFT HEART CATHETERIZATION WITH CORONARY/GRAFT ANGIOGRAM N/A 02/02/2013   Procedure: LEFT HEART CATHETERIZATION WITH Beatrix Fetters;  Surgeon: Jacolyn Reedy, MD;  Location: [facility] CATH LAB;  Service: Cardiovascular;  Laterality: N/A;  . LOWER EXTREMITY ANGIOGRAM Right 06/27/2015   Procedure: Lower Extremity Angiogram;  Surgeon: Serafina Mitchell, MD;  Location: [place] CV LAB;  Service: Cardiovascular;  Laterality: Right;  . MASTOIDECTOMY  1933  . PACEMAKER IMPLANT N/A 05/21/2020   Procedure: PACEMAKER IMPLANT;  Surgeon: Vickie Epley, MD;  Location: [place] CV LAB;  Service: Cardiovascular;  Laterality: N/A;  . PERIPHERAL VASCULAR CATHETERIZATION N/A 01/23/2015   Procedure: Abdominal Aortogram;  Surgeon: Serafina Mitchell, MD;  Location: [place] CV LAB;  Service:  Cardiovascular;  Laterality: N/A;  . PERIPHERAL VASCULAR CATHETERIZATION N/A 06/27/2015   Procedure: Abdominal Aortogram;  Surgeon: Serafina Mitchell, MD;  Location: [place] CV LAB;  Service: Cardiovascular;  Laterality: N/A;  . RETINAL DETACHMENT SURGERY Left    Hx: of left eye  . TEE WITHOUT CARDIOVERSION N/A 02/22/2020   Procedure: TRANSESOPHAGEAL ECHOCARDIOGRAM (TEE);  Surgeon: [name], MD;  Location: [place];  Service: Cardiovascular;  Laterality: N/A;  . TONSILLECTOMY      Social History   Socioeconomic History  . Marital status: Married    Spouse name: Not on file  . Number of children: 3  . Years of education: Not on file  . Highest education level: Not on file  Occupational History  . Occupation: retired  Tobacco Use  . Smoking status: Former Smoker    Types: Cigarettes    Quit date: 04/29/1975    Years since quitting: 45.2  . Smokeless tobacco: Never Used  Vaping Use  . Vaping Use: Never used  Substance and Sexual Activity  . Alcohol use: Yes    Alcohol/week: 3.0 standard  drinks    Types: 3 Glasses of wine per week    Comment: 3 glasses of wine today  . Drug use: No  . Sexual activity: Not Currently  Other Topics Concern  . 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 Heart disease Mother        After 49 yrs of age  . Heart attack Mother   . Hyperlipidemia Father   . Hypertension Father   . Heart disease Father        After 50 yrs of age  . Diabetes Father   . Stroke Sister   . Colon cancer Neg Hx   . Stomach cancer Neg Hx   . Rectal cancer Neg Hx   . Esophageal cancer Neg Hx   . Liver disease Neg Hx   . Kidney disease Neg Hx     Current Outpatient Medications  Medication Sig Dispense Refill  . ACCU-CHEK AVIVA PLUS test strip     . allopurinol (ZYLOPRIM) 100 MG tablet Take 100 mg by mouth daily.     Marland Kitchen amiodarone (PACERONE) 200 MG tablet Take 1 tablet (200 mg total) by mouth daily. 90 tablet 1  . Blood Glucose Monitoring Suppl (ACCU-CHEK AVIVA PLUS) w/Device KIT     . clopidogrel (PLAVIX) 75 MG tablet Take 1 tablet (75 mg total) by mouth daily.    Marland Kitchen escitalopram (LEXAPRO) 10 MG tablet Take 10 mg by mouth daily.    . furosemide (LASIX) 40 MG tablet Take 1 tablet (40 mg total) by mouth daily. 90 tablet 3  . HYDROcodone-acetaminophen (NORCO/VICODIN) 5-325 MG tablet Take 1 tablet by mouth every 4 (four) hours as needed for moderate pain.     . isosorbide mononitrate (IMDUR) 30 MG 24 hr tablet Take 30 mg by mouth daily.    . lansoprazole (PREVACID) 30 MG capsule Take 30 mg by mouth daily.    Marland Kitchen levothyroxine (SYNTHROID, LEVOTHROID) 88 MCG tablet Take 88 mcg by mouth daily before breakfast.    .  melatonin  5 MG TABS Take 5 mg by mouth at bedtime.    . multivitamin (RENA-VIT) TABS tablet Take 1 tablet by mouth daily.    . nitroGLYCERIN (NITROSTAT) 0.4 MG SL tablet Place 0.4 mg under the tongue every 5 (five) minutes as needed for chest pain.     . rivaroxaban (XARELTO) 20 MG TABS tablet Take 1 tablet (20 mg total) by mouth daily with supper. 90 tablet 3  . rosuvastatin (CRESTOR) 40 MG tablet Take 40 mg by mouth daily.    . sacubitril-valsartan (ENTRESTO) 24-26 MG Take 1 tablet by mouth daily. 60 tablet 3   No current facility-administered medications for this visit.    Allergies  Allergen Reactions  . Amoxicillin Rash  . Metoprolol Itching    When out in the sun, pt is currently taking.     REVIEW OF SYSTEMS:   _0  denotes positive finding, _1  denotes negative finding Cardiac  Comments:  Chest pain or chest pressure:    Shortness of breath upon exertion:    Short of breath when lying flat:    Irregular heart rhythm:        Vascular    Pain in calf, thigh, or hip brought on by ambulation:    Pain in feet at night that wakes you up from your sleep:     Blood clot in your veins:    Leg swelling:         Pulmonary    Oxygen at home:    Productive cough:     Wheezing:         Neurologic    Sudden weakness in arms or legs:     Sudden numbness in arms or legs:     Sudden onset of difficulty speaking or slurred speech:    Temporary loss of vision in one eye:     Problems with dizziness:  x  see HPI      Gastrointestinal    Blood in stool:     Vomited blood:         Genitourinary    Burning when urinating:     Blood in urine:        Psychiatric    Major depression:         Hematologic    Bleeding problems:    Problems with blood clotting too easily:        Skin    Rashes or ulcers:        Constitutional    Fever or chills:      PHYSICAL EXAMINATION:  Vitals:   07/25/20 0838 07/25/20 0840  BP: 116/67 105/64  Pulse: 70   Resp: 20   Temp: 98.1 F (36.7 C)    SpO2: 99%    General:  WDWN in NAD; vital signs documented above Gait: Unaided, no ataxia HENT: WNL, normocephalic Pulmonary: normal non-labored breathing , without Rales, rhonchi,  wheezing Cardiac: regular HR, without  Murmurs with left carotid bruit Musculoskeletal: no muscle wasting or atrophy, over 5 bilateral hand grip strength  Neurologic: A&O X 3;  No focal weakness or paresthesias are detected.  Patient is alert and oriented x4.  His face is symmetrical.  His tongue is midline. Psychiatric:  The pt has Normal affect.   Non-Invasive Vascular Imaging:   07/25/2020 Right Carotid: Patent stent with no evidence of stenosis in the right ICA.  The ECA appears >50% stenosed.   Left Carotid: Velocities in the left ICA are consistent with a 60-79%  stenosis. The ECA appears >50% stenosed.   Vertebrals: Bilateral vertebral arteries demonstrate antegrade flow.   Subclavians: Right subclavian artery flow was disturbed.   ASSESSMENT/PLAN:: 87 y.o. male here for complaints of recent episode of double vision.  His carotid duplex is unchanged.  His blood pressure is essentially equal in both left and right arms.  He has no symptoms referable to carotid artery disease or subclavian steal.  I did discuss his symptomology with Dr. Oneida Alar today who recommends ophthalmology or neurology evaluation.  The patient states he is an established patient with [facility].  The patient states he recently canceled his annual eye exam as he was having no vision acuity changes.  I explained the need for the double vision evaluation and that he was stable from carotid stenosis standpoint.  Continue Plavix and statin.  He has follow-up arranged.  Barbie Banner, PA-C [facility] [phone]  Clinic MD:   Oneida Alar

## 2020-07-25 NOTE — Patient Instructions (Signed)
Medication Instructions:  Your physician has recommended you make the following change in your medication:  DECREASE: Entresto 24/26 mg take one tablet by mouth every other day.  *If you need a refill on your cardiac medications before your next appointment, please call your pharmacy*   Lab Work: Your physician recommends that you return for lab work in: TODAY BMP, ProBNP If you have labs (blood work) drawn today and your tests are completely normal, you will receive your results only by: Marland Kitchen MyChart Message (if you have MyChart) OR . A paper copy in the mail If you have any lab test that is abnormal or we need to change your treatment, we will call you to review the results.   Testing/Procedures: None   Follow-Up: At [facility], you and your health needs are our priority.  As part of our continuing mission to provide you with exceptional heart care, we have created designated Provider Care Teams.  These Care Teams include your primary Cardiologist (physician) and Advanced Practice Providers (APPs -  Physician Assistants and Nurse Practitioners) who all work together to provide you with the care you need, when you need it.  We recommend signing up for the patient portal called "MyChart".  Sign up information is provided on this After Visit Summary.  MyChart is used to connect with patients for Virtual Visits (Telemedicine).  Patients are able to view lab/test results, encounter notes, upcoming appointments, etc.  Non-urgent messages can be sent to your provider as well.   To learn more about what you can do with MyChart, go to [email or web address].    Your next appointment:   2 month(s)  The format for your next appointment:   In Person  Provider:   Shirlee More, MD   Other Instructions

## 2020-07-26 ENCOUNTER — Telehealth: Payer: Self-pay

## 2020-07-26 DIAGNOSIS — E785 Hyperlipidemia, unspecified: Secondary | ICD-10-CM | POA: Diagnosis not present

## 2020-07-26 DIAGNOSIS — I130 Hypertensive heart and chronic kidney disease with heart failure and stage 1 through stage 4 chronic kidney disease, or unspecified chronic kidney disease: Secondary | ICD-10-CM | POA: Diagnosis not present

## 2020-07-26 DIAGNOSIS — H35373 Puckering of macula, bilateral: Secondary | ICD-10-CM | POA: Diagnosis not present

## 2020-07-26 DIAGNOSIS — H532 Diplopia: Secondary | ICD-10-CM | POA: Diagnosis not present

## 2020-07-26 DIAGNOSIS — I5033 Acute on chronic diastolic (congestive) heart failure: Secondary | ICD-10-CM | POA: Diagnosis not present

## 2020-07-26 DIAGNOSIS — N1831 Chronic kidney disease, stage 3a: Secondary | ICD-10-CM | POA: Diagnosis not present

## 2020-07-26 LAB — BASIC METABOLIC PANEL
BUN/Creatinine Ratio: 21 (ref 10–24)
BUN: 30 mg/dL — ABNORMAL HIGH (ref 8–27)
CO2: 23 mmol/L (ref 20–29)
Calcium: 9 mg/dL (ref 8.6–10.2)
Chloride: 103 mmol/L (ref 96–106)
Creatinine, Ser: 1.42 mg/dL — ABNORMAL HIGH (ref 0.76–1.27)
Glucose: 140 mg/dL — ABNORMAL HIGH (ref 65–99)
Potassium: 4.3 mmol/L (ref 3.5–5.2)
Sodium: 143 mmol/L (ref 134–144)
eGFR: 47 mL/min/{1.73_m2} — ABNORMAL LOW (ref >59)

## 2020-07-26 LAB — PRO B NATRIURETIC PEPTIDE: NT-Pro BNP: 775 pg/mL — ABNORMAL HIGH (ref 0–486)

## 2020-07-26 NOTE — Telephone Encounter (Signed)
-----   Message from Richardo Priest, MD sent at 07/26/2020  8:02 AM EDT ----- Stable no changes

## 2020-07-26 NOTE — Telephone Encounter (Signed)
Spoke with patient regarding results and recommendation.  Patient verbalizes understanding and is agreeable to plan of care. Advised patient to call back with any issues or concerns.  

## 2020-07-26 NOTE — Telephone Encounter (Signed)
Left message on patients voicemail to please return our call.   

## 2020-08-04 ENCOUNTER — Other Ambulatory Visit: Payer: Self-pay

## 2020-08-04 DIAGNOSIS — I70219 Atherosclerosis of native arteries of extremities with intermittent claudication, unspecified extremity: Secondary | ICD-10-CM

## 2020-08-21 ENCOUNTER — Ambulatory Visit (INDEPENDENT_AMBULATORY_CARE_PROVIDER_SITE_OTHER): Payer: Medicare HMO

## 2020-08-21 DIAGNOSIS — I480 Paroxysmal atrial fibrillation: Secondary | ICD-10-CM | POA: Diagnosis not present

## 2020-08-21 LAB — CUP PACEART REMOTE DEVICE CHECK
Date Time Interrogation Session: 20220426064633
Implantable Lead Implant Date: 20220124
Implantable Lead Implant Date: 20220124
Implantable Lead Location: 753859
Implantable Lead Location: 753860
Implantable Lead Model: 377
Implantable Lead Model: 377
Implantable Lead Serial Number: 8000155840
Implantable Lead Serial Number: 8000166398
Implantable Pulse Generator Implant Date: 20220124
Pulse Gen Model: 407145
Pulse Gen Serial Number: 70002016

## 2020-08-22 DIAGNOSIS — D49511 Neoplasm of unspecified behavior of right kidney: Secondary | ICD-10-CM | POA: Diagnosis not present

## 2020-08-23 ENCOUNTER — Other Ambulatory Visit: Payer: Self-pay

## 2020-08-23 ENCOUNTER — Ambulatory Visit (HOSPITAL_COMMUNITY)
Admission: RE | Admit: 2020-08-23 | Discharge: 2020-08-23 | Disposition: A | Discharge status: Home / Self Care | Payer: Medicare HMO | Source: Ambulatory Visit | Attending: Vascular Surgery | Admitting: Vascular Surgery

## 2020-08-23 ENCOUNTER — Ambulatory Visit: Payer: Medicare HMO

## 2020-08-23 ENCOUNTER — Encounter (HOSPITAL_COMMUNITY): Payer: Medicare HMO

## 2020-08-23 ENCOUNTER — Ambulatory Visit: Payer: Medicare HMO | Admitting: Physician Assistant

## 2020-08-23 VITALS — BP 138/73 | HR 87 | Temp 98.0°F | Resp 20 | Ht 70.0 in | Wt 153.8 lb

## 2020-08-23 DIAGNOSIS — I70219 Atherosclerosis of native arteries of extremities with intermittent claudication, unspecified extremity: Secondary | ICD-10-CM | POA: Diagnosis not present

## 2020-08-23 NOTE — Progress Notes (Signed)
Office Note     CC:  follow up Requesting Provider:  Ginger Organ., MD  HPI: Ian Cummings is a 87 y.o. (16-Apr-1929) male who presents for follow up of peripheral artery disease. He is status post right iliofemoral endarterectomy with bovine patch and right femoral to below the knee popliteal artery bypass with PTFE on 08/03/2015 by Dr. Trula Slade due to heel and great toe ulcer which have since healed.He has a known left SFA occlusion. At his last visit he was having stable LLE claudication symptoms at approximately 50 feet without rest pain or wounds.  He currently denies claudication or rest pain.  He states his functionality has greatly improved since his pacemaker insertion.  He has a history of paroxysmal atrial fibrillation and is maintained on Xarelto. He is compliant with Plavix and statin. He quit smoking cigarettes in 1977. He has a history of PTCA with stent placement in March 2018. Patient underwent MRI of the abdomen with and without contrast on May 16, 2020 secondary to right renal mass.  Aortic atherosclerosis was noted without evidence of aneurysm in the abdominal vasculature.  Past Medical History:  Diagnosis Date  . AAA (abdominal aortic aneurysm) ([place])    2004, Pt stated not there now  . Anginal pain ([place])   . Anxiety   . Arthritis   . Atherosclerosis of native arteries of the extremities with intermittent claudication 12/04/2011   ABI .73 and .54 in 2012   . Atherosclerotic PVD with intermittent claudication ([place]) 02/20/2014  . BPH (benign prostatic hypertrophy)   . Bradycardia 01/12/2019  . Bradycardia on ECG 11/11/2017  . CAD (coronary artery disease)   . Carotid artery disease ([place])    Right carotid stent 2013  . Carotid artery disease without cerebral infarction [facility])    S/p right carotid stent for asymptomatic disease  2013   . Chronic diastolic (congestive) heart failure ([place]) 09/10/2019  . Chronic kidney disease, stage 3a ([place]) 09/10/2019  .  Claudication ([place])   . Colon polyps    adenomatous  . Complex sleep apnea syndrome 11/11/2017  . Conjunctival lesion 05/04/2014   Formatting of this note might be different from the original. left  . Coronary artery disease   . Coronary artery disease involving native coronary artery of native heart with unstable angina pectoris ([place]) 09/06/2012   CABG w LIMA to LAD, SVG to OM1-OM2, SVG to dx, SVG to PD/PL 1995,  Cypher stent x 2 SVG to RCA and Cypher stent to Circ Graft 05/13/07, stent of SVG to circ OM in [place] in July 2013  Cath 4/14 [facility] normal Left main, occluded mid LAD, occluded RCA SVG, occluded CFX, widely patent OM 1 SVG, occluded RCA SVG, occluded Diag 1 SVG, widely patent LAD LIMA graft;  Redo CABG Dr. Servando Snare  10/18/12 with   . Depression   . Essential hypertension 01/12/2019  . GERD (gastroesophageal reflux disease)   . Gout   . H/O hiatal hernia   . Hyperlipidemia   . Hypertension   . Hypertension, renal disease, stage 1-4 or unspecified chronic kidney disease   . Hypothyroidism   . Malnutrition of moderate degree 08/05/2015  . MI (myocardial infarction) ([place]) 4/14  . Mixed hyperlipidemia   . Neuropathy    Hx: of in B/L toes  . OSA on CPAP 02/17/2018  . PAD (peripheral artery disease) ([place]) 08/03/2015  . PAF (paroxysmal atrial fibrillation) ([place])   . Peripheral vascular disease ([place])    with claudication  .  Pre-diabetes   . RBBB 01/12/2019  . s/p CABG   . Shoulder pain 01/27/2019  . Stroke ([place])   . TIA (transient ischemic attack)    Hx: of  . Unstable angina ([place]) 09/10/2019    Past Surgical History:  Procedure Laterality Date  . CARDIOVERSION N/A 02/22/2020   Procedure: CARDIOVERSION;  Surgeon: [name], MD;  Location: [place];  Service: Cardiovascular;  Laterality: N/A;  . CAROTID STENT INSERTION Right 2013   right at [facility]  . CATARACT EXTRACTION Bilateral    Hx: of both eyes  . COLONOSCOPY     Hx: of  . COLONOSCOPY    . CORONARY  ANGIOPLASTY WITH STENT PLACEMENT  2010  . CORONARY ARTERY BYPASS GRAFT  1995  . CORONARY ARTERY BYPASS GRAFT N/A 10/18/2012   Procedure: REDO CORONARY ARTERY BYPASS GRAFTING (CABG);  Surgeon: Grace Isaac, MD;  Location: [place];  Service: Open Heart Surgery;  Laterality: N/A;  off pump times two using endoscopically harvested left saphenous vein  . CORONARY STENT PLACEMENT  06/2016  . ENDARTERECTOMY FEMORAL Right 08/03/2015   Procedure: RIGHT FEMORAL ENDARTERECTOMY WITH PATCH ANGIOPLASTY;  Surgeon: Serafina Mitchell, MD;  Location: [place];  Service: Vascular;  Laterality: Right;  . FEMORAL-POPLITEAL BYPASS GRAFT Right 08/03/2015   Procedure: RIGHT FEMORAL-BELOW KNEE POPLITEAL ARTERY BYPASS GRAFT;  Surgeon: Serafina Mitchell, MD;  Location: [place];  Service: Vascular;  Laterality: Right;  . FOOT SURGERY Left   . FRACTURE SURGERY Left    Hx: of Left heel  . INGUINAL HERNIA REPAIR Bilateral    X 2   . INTRAOPERATIVE TRANSESOPHAGEAL ECHOCARDIOGRAM N/A 10/18/2012   Procedure: INTRAOPERATIVE TRANSESOPHAGEAL ECHOCARDIOGRAM;  Surgeon: Grace Isaac, MD;  Location: [place];  Service: Open Heart Surgery;  Laterality: N/A;  . LEFT HEART CATH AND CORS/GRAFTS ANGIOGRAPHY N/A 10/25/2019   Procedure: LEFT HEART CATH AND CORS/GRAFTS ANGIOGRAPHY;  Surgeon: Leonie Man, MD;  Location: [place] CV LAB;  Service: Cardiovascular;  Laterality: N/A;  . LEFT HEART CATHETERIZATION WITH CORONARY/GRAFT ANGIOGRAM N/A 02/02/2013   Procedure: LEFT HEART CATHETERIZATION WITH Beatrix Fetters;  Surgeon: Jacolyn Reedy, MD;  Location: [facility] CATH LAB;  Service: Cardiovascular;  Laterality: N/A;  . LOWER EXTREMITY ANGIOGRAM Right 06/27/2015   Procedure: Lower Extremity Angiogram;  Surgeon: Serafina Mitchell, MD;  Location: [place] CV LAB;  Service: Cardiovascular;  Laterality: Right;  . MASTOIDECTOMY  1933  . PACEMAKER IMPLANT N/A 05/21/2020   Procedure: PACEMAKER IMPLANT;  Surgeon: Vickie Epley, MD;  Location: [place] CV LAB;  Service: Cardiovascular;  Laterality: N/A;  . PERIPHERAL VASCULAR CATHETERIZATION N/A 01/23/2015   Procedure: Abdominal Aortogram;  Surgeon: Serafina Mitchell, MD;  Location: [place] CV LAB;  Service: Cardiovascular;  Laterality: N/A;  . PERIPHERAL VASCULAR CATHETERIZATION N/A 06/27/2015   Procedure: Abdominal Aortogram;  Surgeon: Serafina Mitchell, MD;  Location: [place] CV LAB;  Service: Cardiovascular;  Laterality: N/A;  . RETINAL DETACHMENT SURGERY Left    Hx: of left eye  . TEE WITHOUT CARDIOVERSION N/A 02/22/2020   Procedure: TRANSESOPHAGEAL ECHOCARDIOGRAM (TEE);  Surgeon: [name], MD;  Location: [place];  Service: Cardiovascular;  Laterality: N/A;  . TONSILLECTOMY      Social History   Socioeconomic History  . Marital status: Married    Spouse name: Not on file  . Number of children: 3  . Years of education: Not on file  . Highest education level: Not on file  Occupational History  .  Occupation: retired  Tobacco Use  . Smoking status: Former Smoker    Types: Cigarettes    Quit date: 04/29/1975    Years since quitting: 45.3  . Smokeless tobacco: Never Used  Vaping Use  . Vaping Use: Never used  Substance and Sexual Activity  . Alcohol use: Yes    Alcohol/week: 3.0 standard drinks    Types: 3 Glasses of wine per week    Comment: 3 glasses of wine today  . Drug use: No  . Sexual activity: Not Currently  Other Topics Concern  . 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 Heart disease Mother        After 41 yrs of age  . Heart attack Mother   . Hyperlipidemia Father   . Hypertension Father    . Heart disease Father        After 85 yrs of age  . Diabetes Father   . Stroke Sister   . Colon cancer Neg Hx   . Stomach cancer Neg Hx   . Rectal cancer Neg Hx   . Esophageal cancer Neg Hx   . Liver disease Neg Hx   . Kidney disease Neg Hx     Current Outpatient Medications  Medication Sig Dispense Refill  . ACCU-CHEK AVIVA PLUS test strip     . allopurinol (ZYLOPRIM) 100 MG tablet Take 100 mg by mouth daily.     Marland Kitchen amiodarone (PACERONE) 200 MG tablet Take 1 tablet (200 mg total) by mouth daily. 90 tablet 1  . Blood Glucose Monitoring Suppl (ACCU-CHEK AVIVA PLUS) w/Device KIT     . clopidogrel (PLAVIX) 75 MG tablet Take 1 tablet (75 mg total) by mouth daily.    Marland Kitchen escitalopram (LEXAPRO) 10 MG tablet Take 10 mg by mouth daily.    . furosemide (LASIX) 20 MG tablet     . HYDROcodone-acetaminophen (NORCO/VICODIN) 5-325 MG tablet Take 1 tablet by mouth every 4 (four) hours as needed for moderate pain.     . isosorbide mononitrate (IMDUR) 30 MG 24 hr tablet Take 30 mg by mouth daily.    . lansoprazole (PREVACID) 30 MG capsule Take 30 mg by mouth daily.    Marland Kitchen levothyroxine (SYNTHROID, LEVOTHROID) 88 MCG tablet Take 88 mcg by mouth daily before breakfast.    . melatonin 5 MG TABS Take 5 mg by mouth at bedtime.    . metoprolol succinate (TOPROL-XL) 50 MG 24 hr tablet Take by mouth.    . multivitamin (RENA-VIT) TABS tablet Take 1 tablet by mouth daily.    . nitroGLYCERIN (NITROSTAT) 0.4 MG SL tablet Place 0.4 mg under the tongue every 5 (five) minutes as needed for chest pain.     . rivaroxaban (XARELTO) 20 MG TABS tablet Take 1 tablet (20 mg total) by mouth daily with supper. 90 tablet 3  . rosuvastatin (CRESTOR) 40 MG tablet Take 40 mg by mouth daily.    . sacubitril-valsartan (ENTRESTO) 24-26 MG Take 1 tablet by mouth every other day. 90 tablet 3   No current facility-administered medications for this visit.    Allergies  Allergen  Reactions  . Atorvastatin     Other reaction(s):  myalgia  . Amoxicillin Rash  . Metoprolol Itching    When out in the sun, pt is currently taking.     REVIEW OF SYSTEMS:   _0  denotes positive finding, _1  denotes negative finding Cardiac  Comments:  Chest pain or chest pressure:    Shortness of breath upon exertion:    Short of breath when lying flat:    Irregular heart rhythm:        Vascular    Pain in calf, thigh, or hip brought on by ambulation:    Pain in feet at night that wakes you up from your sleep:     Blood clot in your veins:    Leg swelling:         Pulmonary    Oxygen at home:    Productive cough:     Wheezing:         Neurologic    Sudden weakness in arms or legs:     Sudden numbness in arms or legs:     Sudden onset of difficulty speaking or slurred speech:    Temporary loss of vision in one eye:     Problems with dizziness:         Gastrointestinal    Blood in stool:     Vomited blood:         Genitourinary    Burning when urinating:     Blood in urine:        Psychiatric    Major depression:         Hematologic    Bleeding problems:    Problems with blood clotting too easily:        Skin    Rashes or ulcers:        Constitutional    Fever or chills:      PHYSICAL EXAMINATION:  Vitals:   08/23/20 1034  BP: 138/73  Pulse: 87  Resp: 20  Temp: 98 F (36.7 C)  TempSrc: Temporal  SpO2: 98%  Weight: 153 lb 12.8 oz (69.8 kg)  Height: _2  (1.778 m)    General:  WDWN in NAD; vital signs documented above Gait: Not observed HENT: WNL, normocephalic Pulmonary: normal non-labored breathing , without Rales, rhonchi,  wheezing Cardiac: regular HR, without  Murmurs with carotid bruits Abdomen: soft, NT, no masses Skin: without rashes Vascular Exam/Pulses: 2+ brachial, radial, femoral pulses bilaterally.  2+ right DP pulse. Extremities: without ischemic changes, without Gangrene , without cellulitis; without open wounds; left foot is warm.  Both feet with intact motor function and  sensation. Musculoskeletal: no muscle wasting or atrophy  Neurologic: A&O X 3;  No focal weakness or paresthesias are detected Psychiatric:  The pt has Normal affect.   Non-Invasive Vascular Imaging:   08/23/2020 ABI/TBIToday's ABIToday's TBIPrevious ABIPrevious TBI  +-------+-----------+-----------+------------+------------+  Right 1.01    0.67    0.95    0.48      +-------+-----------+-----------+------------+------------+  Left  0.65    0.41    0.84    0.3       +-------+-----------+-----------+------------+------------+  The right great toe pressure is 106.  Waveforms are biphasic The left great toe pressure is 64.  The anterior tibial waveform is monophasic the posterior tibial waveform is biphasic.    ASSESSMENT/PLAN:: 87 y.o. male here for follow up for peripheral arterial disease.  He has had a drop in left ABI of 0.19.  He has no  symptoms currently and no tissue loss or nonhealing ulcers.  He is able to perform his ADLs and other activities without limitation.  No rest pain.  Palpable right DP pulse.  He recently underwent right lower extremity arterial bypass graft duplex on May 25, 2020.  Advised to continue routine exercise and activity.  He is encouraged to call our office should he develop lower extremity pain or skin breakdown of the lower extremities.    The patient has a history of carotid artery disease and is scheduled for carotid duplex and follow-up in September of this year.  We will arrange left lower extremity arterial duplex at that visit.  Barbie Banner, PA-C [facility] [phone]  Clinic MD:   Dr. Oneida Alar

## 2020-08-25 DIAGNOSIS — I5033 Acute on chronic diastolic (congestive) heart failure: Secondary | ICD-10-CM | POA: Diagnosis not present

## 2020-08-25 DIAGNOSIS — N1831 Chronic kidney disease, stage 3a: Secondary | ICD-10-CM | POA: Diagnosis not present

## 2020-08-25 DIAGNOSIS — I130 Hypertensive heart and chronic kidney disease with heart failure and stage 1 through stage 4 chronic kidney disease, or unspecified chronic kidney disease: Secondary | ICD-10-CM | POA: Diagnosis not present

## 2020-08-28 ENCOUNTER — Other Ambulatory Visit: Payer: Self-pay

## 2020-08-28 ENCOUNTER — Encounter: Payer: Self-pay | Admitting: Cardiology

## 2020-08-28 ENCOUNTER — Ambulatory Visit: Payer: Medicare HMO | Admitting: Cardiology

## 2020-08-28 VITALS — BP 150/76 | HR 74 | Ht 70.0 in | Wt 152.8 lb

## 2020-08-28 DIAGNOSIS — I495 Sick sinus syndrome: Secondary | ICD-10-CM | POA: Diagnosis not present

## 2020-08-28 DIAGNOSIS — I480 Paroxysmal atrial fibrillation: Secondary | ICD-10-CM

## 2020-08-28 DIAGNOSIS — Z950 Presence of cardiac pacemaker: Secondary | ICD-10-CM | POA: Diagnosis not present

## 2020-08-28 DIAGNOSIS — R55 Syncope and collapse: Secondary | ICD-10-CM

## 2020-08-28 MED ORDER — AMIODARONE HCL 200 MG PO TABS: 100.0000 mg | ORAL_TABLET | Freq: Every day | ORAL | 3 refills | Status: DC

## 2020-08-28 NOTE — Patient Instructions (Addendum)
Medication Instructions:  Your physician has recommended you make the following change in your medication:  1.  REDUCE your amiodarone 200 mg-  Take 1/2 tablet (100 mg) by mouth daily  *If you need a refill on your cardiac medications before your next appointment, please call your pharmacy*  Lab Work: You will get lab work today:  CMP, TSH and free T4 If you have labs (blood work) drawn today and your tests are completely normal, you will receive your results only by: Marland Kitchen MyChart Message (if you have MyChart) OR . A paper copy in the mail If you have any lab test that is abnormal or we need to change your treatment, we will call you to review the results.  Testing/Procedures: None ordered.  Follow-Up: At [facility], you and your health needs are our priority.  As part of our continuing mission to provide you with exceptional heart care, we have created designated Provider Care Teams.  These Care Teams include your primary Cardiologist (physician) and Advanced Practice Providers (APPs -  Physician Assistants and Nurse Practitioners) who all work together to provide you with the care you need, when you need it.  Your next appointment:   Your physician wants you to follow-up in: 6 months with Dr. Quentin Ore.   You will receive a reminder letter in the mail two months in advance. If you don't receive a letter, please call our office to schedule the follow-up appointment.  Remote monitoring is used to monitor your Pacemaker from home. This monitoring reduces the number of office visits required to check your device to one time per year. It allows [place] to keep an eye on the functioning of your device to ensure it is working properly. You are scheduled for a device check from home on 11/20/2020. You may send your transmission at any time that day. If you have a wireless device, the transmission will be sent automatically. After your physician reviews your transmission, you will receive a postcard with  your next transmission date.

## 2020-08-28 NOTE — Progress Notes (Signed)
Electrophysiology Office Follow up Visit Note:    Date:  08/28/2020   ID:  Ian Cummings, DOB 1929/03/13, MRN [id number]  PCP:  Ginger Organ., MD  [facility] Cardiologist:  Shirlee More, MD  [facility] Electrophysiologist:  Vickie Epley, MD    Interval History:    Ian Cummings is a 87 y.o. male who presents for a follow up visit.  He had a permanent pacemaker implant on May 21, 2020 for sinus pauses and syncope.  He did well after implant.  Review of the EKG tracings at the time of his syncopal episode suggested components of sinus node dysfunction and vasovagal syncope.  I implanted a Biotronik dual-chamber pacemaker with closed-loop stimulation capabilities to help combat both of these.  He has done well since implant.  No further episodes of syncope or presyncope.  Patient does tell me that some days in the middle of the day he will feel tired.        Past Medical History:  Diagnosis Date  . AAA (abdominal aortic aneurysm) ([place])    2004, Pt stated not there now  . Anginal pain ([place])   . Anxiety   . Arthritis   . Atherosclerosis of native arteries of the extremities with intermittent claudication 12/04/2011   ABI .52 and .54 in 2012   . Atherosclerotic PVD with intermittent claudication ([place]) 02/20/2014  . BPH (benign prostatic hypertrophy)   . Bradycardia 01/12/2019  . Bradycardia on ECG 11/11/2017  . CAD (coronary artery disease)   . Carotid artery disease ([place])    Right carotid stent 2013  . Carotid artery disease without cerebral infarction [facility])    S/p right carotid stent for asymptomatic disease  2013   . Chronic diastolic (congestive) heart failure ([place]) 09/10/2019  . Chronic kidney disease, stage 3a ([place]) 09/10/2019  . Claudication ([place])   . Colon polyps    adenomatous  . Complex sleep apnea syndrome 11/11/2017  . Conjunctival lesion 05/04/2014   Formatting of this note might be different from the original. left  . Coronary artery disease   .  Coronary artery disease involving native coronary artery of native heart with unstable angina pectoris ([place]) 09/06/2012   CABG w LIMA to LAD, SVG to OM1-OM2, SVG to dx, SVG to PD/PL 1995,  Cypher stent x 2 SVG to RCA and Cypher stent to Circ Graft 05/13/07, stent of SVG to circ OM in [place] in July 2013  Cath 4/14 [facility] normal Left main, occluded mid LAD, occluded RCA SVG, occluded CFX, widely patent OM 1 SVG, occluded RCA SVG, occluded Diag 1 SVG, widely patent LAD LIMA graft;  Redo CABG Dr. Servando Snare  10/18/12 with   . Depression   . Essential hypertension 01/12/2019  . GERD (gastroesophageal reflux disease)   . Gout   . H/O hiatal hernia   . Hyperlipidemia   . Hypertension   . Hypertension, renal disease, stage 1-4 or unspecified chronic kidney disease   . Hypothyroidism   . Malnutrition of moderate degree 08/05/2015  . MI (myocardial infarction) ([place]) 4/14  . Mixed hyperlipidemia   . Neuropathy    Hx: of in B/L toes  . OSA on CPAP 02/17/2018  . PAD (peripheral artery disease) ([place]) 08/03/2015  . PAF (paroxysmal atrial fibrillation) ([place])   . Peripheral vascular disease ([place])    with claudication  . Pre-diabetes   . RBBB 01/12/2019  . s/p CABG   . Shoulder pain 01/27/2019  . Stroke ([place])   .  TIA (transient ischemic attack)    Hx: of  . Unstable angina ([place]) 09/10/2019    Past Surgical History:  Procedure Laterality Date  . CARDIOVERSION N/A 02/22/2020   Procedure: CARDIOVERSION;  Surgeon: [name], MD;  Location: [place];  Service: Cardiovascular;  Laterality: N/A;  . CAROTID STENT INSERTION Right 2013   right at [facility]  . CATARACT EXTRACTION Bilateral    Hx: of both eyes  . COLONOSCOPY     Hx: of  . COLONOSCOPY    . CORONARY ANGIOPLASTY WITH STENT PLACEMENT  2010  . CORONARY ARTERY BYPASS GRAFT  1995  . CORONARY ARTERY BYPASS GRAFT N/A 10/18/2012   Procedure: REDO CORONARY ARTERY BYPASS GRAFTING (CABG);  Surgeon: Grace Isaac, MD;  Location: [place];   Service: Open Heart Surgery;  Laterality: N/A;  off pump times two using endoscopically harvested left saphenous vein  . CORONARY STENT PLACEMENT  06/2016  . ENDARTERECTOMY FEMORAL Right 08/03/2015   Procedure: RIGHT FEMORAL ENDARTERECTOMY WITH PATCH ANGIOPLASTY;  Surgeon: Serafina Mitchell, MD;  Location: [place];  Service: Vascular;  Laterality: Right;  . FEMORAL-POPLITEAL BYPASS GRAFT Right 08/03/2015   Procedure: RIGHT FEMORAL-BELOW KNEE POPLITEAL ARTERY BYPASS GRAFT;  Surgeon: Serafina Mitchell, MD;  Location: [place];  Service: Vascular;  Laterality: Right;  . FOOT SURGERY Left   . FRACTURE SURGERY Left    Hx: of Left heel  . INGUINAL HERNIA REPAIR Bilateral    X 2   . INTRAOPERATIVE TRANSESOPHAGEAL ECHOCARDIOGRAM N/A 10/18/2012   Procedure: INTRAOPERATIVE TRANSESOPHAGEAL ECHOCARDIOGRAM;  Surgeon: Grace Isaac, MD;  Location: [place];  Service: Open Heart Surgery;  Laterality: N/A;  . LEFT HEART CATH AND CORS/GRAFTS ANGIOGRAPHY N/A 10/25/2019   Procedure: LEFT HEART CATH AND CORS/GRAFTS ANGIOGRAPHY;  Surgeon: Leonie Man, MD;  Location: [place] CV LAB;  Service: Cardiovascular;  Laterality: N/A;  . LEFT HEART CATHETERIZATION WITH CORONARY/GRAFT ANGIOGRAM N/A 02/02/2013   Procedure: LEFT HEART CATHETERIZATION WITH Beatrix Fetters;  Surgeon: Jacolyn Reedy, MD;  Location: [facility] CATH LAB;  Service: Cardiovascular;  Laterality: N/A;  . LOWER EXTREMITY ANGIOGRAM Right 06/27/2015   Procedure: Lower Extremity Angiogram;  Surgeon: Serafina Mitchell, MD;  Location: [place] CV LAB;  Service: Cardiovascular;  Laterality: Right;  . MASTOIDECTOMY  1933  . PACEMAKER IMPLANT N/A 05/21/2020   Procedure: PACEMAKER IMPLANT;  Surgeon: Vickie Epley, MD;  Location: [place] CV LAB;  Service: Cardiovascular;  Laterality: N/A;  . PERIPHERAL VASCULAR CATHETERIZATION N/A 01/23/2015   Procedure: Abdominal Aortogram;  Surgeon: Serafina Mitchell, MD;  Location: [place] CV LAB;  Service:  Cardiovascular;  Laterality: N/A;  . PERIPHERAL VASCULAR CATHETERIZATION N/A 06/27/2015   Procedure: Abdominal Aortogram;  Surgeon: Serafina Mitchell, MD;  Location: [place] CV LAB;  Service: Cardiovascular;  Laterality: N/A;  . RETINAL DETACHMENT SURGERY Left    Hx: of left eye  . TEE WITHOUT CARDIOVERSION N/A 02/22/2020   Procedure: TRANSESOPHAGEAL ECHOCARDIOGRAM (TEE);  Surgeon: [name], MD;  Location: [place];  Service: Cardiovascular;  Laterality: N/A;  . TONSILLECTOMY      Current Medications: Current Meds  Medication Sig  . ACCU-CHEK AVIVA PLUS test strip   . allopurinol (ZYLOPRIM) 100 MG tablet Take 100 mg by mouth daily.   Marland Kitchen amiodarone (PACERONE) 200 MG tablet Take 0.5 tablets (100 mg total) by mouth daily.  . Blood Glucose Monitoring Suppl (ACCU-CHEK AVIVA PLUS) w/Device KIT   . clopidogrel (PLAVIX) 75 MG tablet Take 1 tablet (75  mg total) by mouth daily.  Marland Kitchen escitalopram (LEXAPRO) 10 MG tablet Take 10 mg by mouth daily.  . furosemide (LASIX) 20 MG tablet   . HYDROcodone-acetaminophen (NORCO/VICODIN) 5-325 MG tablet Take 1 tablet by mouth every 4 (four) hours as needed for moderate pain.   . isosorbide mononitrate (IMDUR) 30 MG 24 hr tablet Take 30 mg by mouth daily.  . lansoprazole (PREVACID) 30 MG capsule Take 30 mg by mouth daily.  Marland Kitchen levothyroxine (SYNTHROID, LEVOTHROID) 88 MCG tablet Take 88 mcg by mouth daily before breakfast.  . melatonin 5 MG TABS Take 5 mg by mouth at bedtime.  . multivitamin (RENA-VIT) TABS tablet Take 1 tablet by mouth daily.  . nitroGLYCERIN (NITROSTAT) 0.4 MG SL tablet Place 0.4 mg under the tongue every 5 (five) minutes as needed for chest pain.   . rivaroxaban (XARELTO) 20 MG TABS tablet Take 1 tablet (20 mg total) by mouth daily with supper.  . rosuvastatin (CRESTOR) 40 MG tablet Take 40 mg by mouth daily.  . sacubitril-valsartan (ENTRESTO) 24-26 MG Take 1 tablet by mouth every other day.  . [DISCONTINUED] amiodarone (PACERONE) 200  MG tablet Take 1 tablet (200 mg total) by mouth daily.     Allergies:   Atorvastatin, Amoxicillin, and Metoprolol   Social History   Socioeconomic History  . Marital status: Married    Spouse name: Not on file  . Number of children: 3  . Years of education: Not on file  . Highest education level: Not on file  Occupational History  . Occupation: retired  Tobacco Use  . Smoking status: Former Smoker    Types: Cigarettes    Quit date: 04/29/1975    Years since quitting: 45.3  . Smokeless tobacco: Never Used  Vaping Use  . Vaping Use: Never used  Substance and Sexual Activity  . Alcohol use: Yes    Alcohol/week: 3.0 standard drinks    Types: 3 Glasses of wine per week    Comment: 3 glasses of wine today  . Drug use: No  . Sexual activity: Not Currently  Other Topics Concern  . 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 There is no history of Colon cancer, Stomach cancer, Rectal cancer, Esophageal cancer, Liver disease, or Kidney disease.  ROS:   Please see the history of present illness.    All other systems reviewed and are negative.  EKGs/Labs/Other Studies Reviewed:    The following studies were reviewed today:  Pacemaker implant note  August 21, 2020 remote device check personally reviewed shows stable lead parameters and good battery longevity.  Atrially pacing 81%.  Ventricularly pacing 3%.  Aug 28, 2020 in clinic device interrogation personally reviewed shows good battery  longevity Stable lead parameters.  81% atrial pacing burden.  3% ventricular pacing burden.  EKG:  The ekg ordered today demonstrates atrially pacing, ventricular sensing at 74 bpm.  Recent Labs: 09/10/2019: B Natriuretic Peptide 284.4 09/12/2019: Magnesium 1.9 04/10/2020: TSH 3.410 05/16/2020: ALT 24; Hemoglobin 12.0;  Platelets 167 07/25/2020: BUN 30; Creatinine, Ser 1.42; NT-Pro BNP 775; Potassium 4.3; Sodium 143  Recent Lipid Panel    Component Value Date/Time   CHOL 121 01/23/2020 1040   TRIG 191 (H) 01/23/2020 1040   HDL 30 (L) 01/23/2020 1040   CHOLHDL 4.0 01/23/2020 1040   CHOLHDL 3.2 04/18/2009 0241   VLDL 23 04/18/2009 0241   LDLCALC 59 01/23/2020 1040    Physical Exam:    VS:  BP (!) 150/76   Pulse 74   Ht _0  (1.778 m)   Wt 152 lb 12.8 oz (69.3 kg)   SpO2 95%   BMI 21.92 kg/m     Wt Readings from Last 3 Encounters:  08/28/20 152 lb 12.8 oz (69.3 kg)  08/23/20 153 lb 12.8 oz (69.8 kg)  07/25/20 153 lb 1.9 oz (69.5 kg)     GEN:  Well nourished, well developed in no acute distress HEENT: Normal NECK: No JVD; No carotid bruits LYMPHATICS: No lymphadenopathy CARDIAC: RRR, no murmurs, rubs, gallops.  Pacemaker pocket well-healed. RESPIRATORY:  Clear to auscultation without rales, wheezing or rhonchi  ABDOMEN: Soft, non-tender, non-distended MUSCULOSKELETAL:  No edema; No deformity  SKIN: Warm and dry NEUROLOGIC:  Alert and oriented x 3 PSYCHIATRIC:  Normal affect   ASSESSMENT:    1. Syncope and collapse   2. Sinus node dysfunction (HCC)   3. Pacemaker   4. Paroxysmal atrial fibrillation (HCC)    PLAN:    In order of problems listed above:  1. Syncope and collapse Secondary to sinus node dysfunction and vasovagal syncope.  No further episodes since pacemaker implanted.  Pacemaker interrogation shows stable lead parameters and good battery longevity.  Device well-functioning.  2.  Paroxysmal atrial fibrillation Maintaining sinus rhythm on  amiodarone.  On rivaroxaban for stroke prophylaxis.  I recommended that we decrease the amiodarone to 100 mg by mouth once daily.  We will check a complete metabolic panel, TSH and free T4 today.  Follow-up 6 months.     Medication Adjustments/Labs and Tests Ordered: Current medicines are reviewed at length with the patient today.  Concerns regarding medicines are outlined above.  Orders Placed This Encounter  Procedures  . Comp Met (CMET)  . TSH  . T4, free  . EKG 12-Lead   Meds ordered this encounter  Medications  . amiodarone (PACERONE) 200 MG tablet    Sig: Take 0.5 tablets (100 mg total) by mouth daily.    Dispense:  45 tablet    Refill:  3     Signed, Lars Mage, MD, [facility], [facility] 08/28/2020 2:33 PM    Electrophysiology [facility]

## 2020-08-29 ENCOUNTER — Other Ambulatory Visit: Payer: Self-pay

## 2020-08-29 DIAGNOSIS — I70219 Atherosclerosis of native arteries of extremities with intermittent claudication, unspecified extremity: Secondary | ICD-10-CM

## 2020-08-29 DIAGNOSIS — I6522 Occlusion and stenosis of left carotid artery: Secondary | ICD-10-CM

## 2020-08-29 LAB — COMPREHENSIVE METABOLIC PANEL
ALT: 45 IU/L — ABNORMAL HIGH (ref 0–44)
AST: 38 IU/L (ref 0–40)
Albumin/Globulin Ratio: 1.4 (ref 1.2–2.2)
Albumin: 4.3 g/dL (ref 3.6–4.6)
Alkaline Phosphatase: 55 IU/L (ref 44–121)
BUN/Creatinine Ratio: 20 (ref 10–24)
BUN: 29 mg/dL — ABNORMAL HIGH (ref 8–27)
Bilirubin Total: 0.5 mg/dL (ref 0.0–1.2)
CO2: 21 mmol/L (ref 20–29)
Calcium: 9.1 mg/dL (ref 8.6–10.2)
Chloride: 101 mmol/L (ref 96–106)
Creatinine, Ser: 1.45 mg/dL — ABNORMAL HIGH (ref 0.76–1.27)
Globulin, Total: 3 g/dL (ref 1.5–4.5)
Glucose: 113 mg/dL — ABNORMAL HIGH (ref 65–99)
Potassium: 4.1 mmol/L (ref 3.5–5.2)
Sodium: 141 mmol/L (ref 134–144)
Total Protein: 7.3 g/dL (ref 6.0–8.5)
eGFR: 46 mL/min/{1.73_m2} — ABNORMAL LOW (ref >59)

## 2020-08-29 LAB — TSH: TSH: 4.17 u[IU]/mL (ref 0.450–4.500)

## 2020-08-29 LAB — T4, FREE: Free T4: 1.47 ng/dL (ref 0.82–1.77)

## 2020-09-12 NOTE — Progress Notes (Signed)
Remote pacemaker transmission.   

## 2020-09-25 DIAGNOSIS — I130 Hypertensive heart and chronic kidney disease with heart failure and stage 1 through stage 4 chronic kidney disease, or unspecified chronic kidney disease: Secondary | ICD-10-CM | POA: Diagnosis not present

## 2020-09-25 DIAGNOSIS — I5033 Acute on chronic diastolic (congestive) heart failure: Secondary | ICD-10-CM | POA: Diagnosis not present

## 2020-09-25 DIAGNOSIS — N1831 Chronic kidney disease, stage 3a: Secondary | ICD-10-CM | POA: Diagnosis not present

## 2020-09-25 DIAGNOSIS — E785 Hyperlipidemia, unspecified: Secondary | ICD-10-CM | POA: Diagnosis not present

## 2020-10-03 NOTE — Progress Notes (Signed)
Cardiology Office Note:    Date:  10/04/2020   ID:  Ian Cummings, DOB 06-Mar-1929, MRN [id number]  PCP:  Ginger Organ., MD  Cardiologist:  Shirlee More, MD    Referring MD: Ginger Organ., MD    ASSESSMENT:    1. 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 Paroxysmal atrial fibrillation ([place])   3. On amiodarone therapy   4. Chronic anticoagulation   5. Sinus node dysfunction (HCC)   6. Pacemaker   7. Coronary artery disease of native artery of native heart with stable angina pectoris ([place])    PLAN:    In order of problems listed above:  He continues to improve his heart failure is compensated he has no fluid overload and he no longer has symptomatic hypotension.  He will continue his current loop diuretic we will increase his Entresto to twice daily trend blood pressure at home.  Consider a follow-up echocardiogram after his next visit Stable maintaining sinus rhythm on low-dose amiodarone without toxicity continue his anticoagulant Stable pacemaker followed by device clinic Stable CAD having no anginal discomfort continue his current medical regimen including his high intensity statin oral nitrate Lipids are ideal continue with statin   Next appointment: 4 months   Medication Adjustments/Labs and Tests Ordered: Current medicines are reviewed at length with the patient today.  Concerns regarding medicines are outlined above.  No orders of the defined types were placed in this encounter.  No orders of the defined types were placed in this encounter.   Chief Complaint  Patient presents with   Follow-up   Atrial Fibrillation   Congestive Heart Failure   Coronary Artery Disease     History of Present Illness:    Ian Cummings is a 87 y.o. male with a hx of complex heart disease including CAD with remote CABG acute coronary syndrome July 2021 with both graft occlusion and severe native CAD not amenable to further  revascularization paroxysmal atrial fibrillation maintaining sinus rhythm on amiodarone symptomatic bradycardia with sinus pauses and permanent pacemaker insertion chronic anticoagulation hypertensive heart disease with chronic combined systolic and diastolic heart failure functional mitral regurgitation stage III CKD and a renal mass highly suspicious for malignancy.  After discussions with the patient wife and urologist a decision was made not to pursue nephrectomy.  He was last seen by me 07/25/2020.  Compliance with diet, lifestyle and medications: Yes  He is doing much better is just returned [place] the beach.  Presently taking care of his wife at home recovering from surgery He still fatigues at the end of the day but he is not having shortness of breath edema chest pain palpitation or syncope tolerates his cardiac medications. He checks blood pressure sporadically gets systolics of 1 76-1 50. He has had no GU symptoms of bleeding or flank pain and is scheduled to be seen by urology in follow-up. Recent labs 08/28/2020 shows creatinine 1.45 potassium 4.1 TSH normal 4.17. Lipids 03/30/2020 shows a cholesterol 105 LDL 46 Past Medical History:  Diagnosis Date   AAA (abdominal aortic aneurysm) ([place])    2004, Pt stated not there now   Anginal pain ([place])    Anxiety    Arthritis    Atherosclerosis of native arteries of the extremities with intermittent claudication 12/04/2011   ABI .36 and .54 in 2012    Atherosclerotic PVD with intermittent claudication ([place]) 02/20/2014   BPH (benign prostatic  hypertrophy)    Bradycardia 01/12/2019   Bradycardia on ECG 11/11/2017   CAD (coronary artery disease)    Carotid artery disease (HCC)    Right carotid stent 2013   Carotid artery disease without cerebral infarction [facility])    S/p right carotid stent for asymptomatic disease  2013    Chronic diastolic (congestive) heart failure ([place]) 09/10/2019   Chronic kidney disease, stage 3a ([place]) 09/10/2019    Claudication ([place])    Colon polyps    adenomatous   Complex sleep apnea syndrome 11/11/2017   Conjunctival lesion 05/04/2014   Formatting of this note might be different from the original. left   Coronary artery disease    Coronary artery disease involving native coronary artery of native heart with unstable angina pectoris ([place]) 09/06/2012   CABG w LIMA to LAD, SVG to OM1-OM2, SVG to dx, SVG to PD/PL 1995,  Cypher stent x 2 SVG to RCA and Cypher stent to Circ Graft 05/13/07, stent of SVG to circ OM in [place] in July 2013  Cath 4/14 [facility] normal Left main, occluded mid LAD, occluded RCA SVG, occluded CFX, widely patent OM 1 SVG, occluded RCA SVG, occluded Diag 1 SVG, widely patent LAD LIMA graft;  Redo CABG Dr. Servando Snare  10/18/12 with    Depression    Essential hypertension 01/12/2019   GERD (gastroesophageal reflux disease)    Gout    H/O hiatal hernia    Hyperlipidemia    Hypertension    Hypertension, renal disease, stage 1-4 or unspecified chronic kidney disease    Hypothyroidism    Malnutrition of moderate degree 08/05/2015   MI (myocardial infarction) ([place]) 4/14   Mixed hyperlipidemia    Neuropathy    Hx: of in B/L toes   OSA on CPAP 02/17/2018   PAD (peripheral artery disease) ([place]) 08/03/2015   PAF (paroxysmal atrial fibrillation) ([place])    Peripheral vascular disease ([place])    with claudication   Pre-diabetes    RBBB 01/12/2019   s/p CABG    Shoulder pain 01/27/2019   Stroke ([place])    TIA (transient ischemic attack)    Hx: of   Unstable angina ([place]) 09/10/2019    Past Surgical History:  Procedure Laterality Date   CARDIOVERSION N/A 02/22/2020   Procedure: CARDIOVERSION;  Surgeon: [name], MD;  Location: [place];  Service: Cardiovascular;  Laterality: N/A;   CAROTID STENT INSERTION Right 2013   right at [place] EXTRACTION Bilateral    Hx: of both eyes   COLONOSCOPY     Hx: of   COLONOSCOPY     CORONARY ANGIOPLASTY WITH STENT PLACEMENT   2010   CORONARY ARTERY BYPASS GRAFT  1995   CORONARY ARTERY BYPASS GRAFT N/A 10/18/2012   Procedure: REDO CORONARY ARTERY BYPASS GRAFTING (CABG);  Surgeon: Grace Isaac, MD;  Location: [place];  Service: Open Heart Surgery;  Laterality: N/A;  off pump times two using endoscopically harvested left saphenous vein   CORONARY STENT PLACEMENT  06/2016   ENDARTERECTOMY FEMORAL Right 08/03/2015   Procedure: RIGHT FEMORAL ENDARTERECTOMY WITH PATCH ANGIOPLASTY;  Surgeon: Serafina Mitchell, MD;  Location: [place];  Service: Vascular;  Laterality: Right;   FEMORAL-POPLITEAL BYPASS GRAFT Right 08/03/2015   Procedure: RIGHT FEMORAL-BELOW KNEE POPLITEAL ARTERY BYPASS GRAFT;  Surgeon: Serafina Mitchell, MD;  Location: [place];  Service: Vascular;  Laterality: Right;   FOOT SURGERY Left    FRACTURE SURGERY Left    Hx: of Left heel  INGUINAL HERNIA REPAIR Bilateral    X 2    INTRAOPERATIVE TRANSESOPHAGEAL ECHOCARDIOGRAM N/A 10/18/2012   Procedure: INTRAOPERATIVE TRANSESOPHAGEAL ECHOCARDIOGRAM;  Surgeon: Grace Isaac, MD;  Location: [place];  Service: Open Heart Surgery;  Laterality: N/A;   LEFT HEART CATH AND CORS/GRAFTS ANGIOGRAPHY N/A 10/25/2019   Procedure: LEFT HEART CATH AND CORS/GRAFTS ANGIOGRAPHY;  Surgeon: Leonie Man, MD;  Location: [place] CV LAB;  Service: Cardiovascular;  Laterality: N/A;   LEFT HEART CATHETERIZATION WITH CORONARY/GRAFT ANGIOGRAM N/A 02/02/2013   Procedure: LEFT HEART CATHETERIZATION WITH Beatrix Fetters;  Surgeon: Jacolyn Reedy, MD;  [facility] C[facility];  Service: Cardiovascular;  Laterality: N/A;   LOWER EXTREMITY ANGIOGRAM Right 06/27/2015   Procedure: Lower Extremity Angiogram;  Surgeon: Serafina Mitchell, MD;  Location: [place] CV LAB;  Service: Cardiovascular;  Laterality: Right;   MASTOIDECTOMY  1933   PACEMAKER IMPLANT N/A 05/21/2020   Procedure: PACEMAKER IMPLANT;  Surgeon: Vickie Epley, MD;  Location: [place] CV LAB;  Service: Cardiovascular;   Laterality: N/A;   PERIPHERAL VASCULAR CATHETERIZATION N/A 01/23/2015   Procedure: Abdominal Aortogram;  Surgeon: Serafina Mitchell, MD;  Location: [place] CV LAB;  Service: Cardiovascular;  Laterality: N/A;   PERIPHERAL VASCULAR CATHETERIZATION N/A 06/27/2015   Procedure: Abdominal Aortogram;  Surgeon: Serafina Mitchell, MD;  Location: [place] CV LAB;  Service: Cardiovascular;  Laterality: N/A;   RETINAL DETACHMENT SURGERY Left    Hx: of left eye   TEE WITHOUT CARDIOVERSION N/A 02/22/2020   Procedure: TRANSESOPHAGEAL ECHOCARDIOGRAM (TEE);  Surgeon: [name], MD;  Location: [facility] ENDOSCOPY;  Service: Cardiovascular;  Laterality: N/A;   TONSILLECTOMY      Current Medications: Current Meds  Medication Sig   allopurinol (ZYLOPRIM) 100 MG tablet Take 100 mg by mouth daily.    amiodarone (PACERONE) 200 MG tablet Take 200 mg by mouth daily.   clopidogrel (PLAVIX) 75 MG tablet Take 1 tablet (75 mg total) by mouth daily.   escitalopram (LEXAPRO) 10 MG tablet Take 10 mg by mouth daily.   furosemide (LASIX) 40 MG tablet Take 40 mg by mouth daily.   isosorbide mononitrate (IMDUR) 30 MG 24 hr tablet Take 30 mg by mouth daily.   lansoprazole (PREVACID) 30 MG capsule Take 30 mg by mouth daily.   levothyroxine (SYNTHROID, LEVOTHROID) 88 MCG tablet Take 88 mcg by mouth daily before breakfast.   multivitamin (RENA-VIT) TABS tablet Take 1 tablet by mouth daily.   nitroGLYCERIN (NITROSTAT) 0.4 MG SL tablet Place 0.4 mg under the tongue every 5 (five) minutes as needed for chest pain.    rivaroxaban (XARELTO) 20 MG TABS tablet Take 1 tablet (20 mg total) by mouth daily with supper.   rosuvastatin (CRESTOR) 40 MG tablet Take 40 mg by mouth daily.   sacubitril-valsartan (ENTRESTO) 24-26 MG Take 1 tablet by mouth daily.     Allergies:   Atorvastatin, Amoxicillin, and Metoprolol   Social History   Socioeconomic History   Marital status: Married    Spouse name: Not on file   Number of children: 3    Years of education: Not on file   Highest education level: Not on file  Occupational History   Occupation: retired  Tobacco Use   Smoking status: Former    Pack years: 0.00    Types: Cigarettes    Quit date: 04/29/1975    Years since quitting: 45.4   Smokeless tobacco: Never  Vaping Use   Vaping Use: Never used  Substance  and Sexual Activity   Alcohol use: Yes    Alcohol/week: 3.0 standard drinks    Types: 3 Glasses of wine per week    Comment: 3 glasses of wine today   Drug use: No   Sexual activity: Not Currently  Other Topics Concern   Not on file  Social History Narrative   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 There is no history of Colon cancer, Stomach cancer, Rectal cancer, Esophageal cancer, Liver disease, or Kidney disease. ROS:   Please see the history of present illness.    All other systems reviewed and are negative.  EKGs/Labs/Other Studies Reviewed:    The following studies were reviewed today:  EKG:  EKG ordered today and personally reviewed.  The ekg ordered today demonstrates sinus rhythm 65 bpm first-degree AV block normal QRS morphology  Recent Labs: 05/16/2020: Hemoglobin 12.0; Platelets 167 07/25/2020: NT-Pro BNP 775 08/28/2020: ALT 45; BUN 29; Creatinine, Ser 1.45; Potassium 4.1; Sodium 141; TSH 4.170  Recent Lipid Panel    Component Value Date/Time   CHOL 121 01/23/2020 1040   TRIG 191 (H) 01/23/2020 1040   HDL 30 (L) 01/23/2020 1040   CHOLHDL 4.0 01/23/2020 1040   CHOLHDL 3.2 04/18/2009 0241   VLDL  23 04/18/2009 0241   LDLCALC 59 01/23/2020 1040    Physical Exam:    VS:  BP 130/76 (BP Location: Left Arm, Patient Position: Sitting, Cuff Size: Normal)   Ht 5\' 10"  (1.778 m)   Wt 155 lb (70.3 kg)   BMI 22.24 kg/m     Wt Readings from Last 3 Encounters:  10/04/20 155 lb (70.3 kg)  08/28/20 152 lb 12.8 oz (69.3 kg)  08/23/20 153 lb 12.8 oz (69.8 kg)     GEN:  Well nourished, well developed in no acute distress HEENT: Normal NECK: No JVD; No carotid bruits LYMPHATICS: No lymphadenopathy CARDIAC: RRR, no murmurs, rubs, gallops RESPIRATORY:  Clear to auscultation without rales, wheezing or rhonchi  ABDOMEN: Soft, non-tender, non-distended MUSCULOSKELETAL:  No edema; No deformity  SKIN: Warm and dry NEUROLOGIC:  Alert and oriented x 3 PSYCHIATRIC:  Normal affect    Signed, Shirlee More, MD  10/04/2020 2:43 PM    [place]

## 2020-10-04 ENCOUNTER — Ambulatory Visit (INDEPENDENT_AMBULATORY_CARE_PROVIDER_SITE_OTHER): Payer: Medicare HMO | Admitting: Cardiology

## 2020-10-04 ENCOUNTER — Other Ambulatory Visit: Payer: Self-pay

## 2020-10-04 ENCOUNTER — Encounter: Payer: Self-pay | Admitting: Cardiology

## 2020-10-04 VITALS — BP 130/76 | HR 65 | Ht 70.0 in | Wt 155.0 lb

## 2020-10-04 DIAGNOSIS — I480 Paroxysmal atrial fibrillation: Secondary | ICD-10-CM

## 2020-10-04 DIAGNOSIS — I25118 Atherosclerotic heart disease of native coronary artery with other forms of angina pectoris: Secondary | ICD-10-CM | POA: Diagnosis not present

## 2020-10-04 DIAGNOSIS — I5042 Chronic combined systolic (congestive) and diastolic (congestive) heart failure: Secondary | ICD-10-CM

## 2020-10-04 DIAGNOSIS — I38 Endocarditis, valve unspecified: Secondary | ICD-10-CM | POA: Diagnosis not present

## 2020-10-04 DIAGNOSIS — Z950 Presence of cardiac pacemaker: Secondary | ICD-10-CM | POA: Diagnosis not present

## 2020-10-04 DIAGNOSIS — Z7901 Long term (current) use of anticoagulants: Secondary | ICD-10-CM | POA: Diagnosis not present

## 2020-10-04 DIAGNOSIS — Z79899 Other long term (current) drug therapy: Secondary | ICD-10-CM

## 2020-10-04 DIAGNOSIS — I495 Sick sinus syndrome: Secondary | ICD-10-CM

## 2020-10-04 MED ORDER — ENTRESTO 24-26 MG PO TABS: 1.0000 | ORAL_TABLET | Freq: Two times a day (BID) | ORAL | 3 refills | Status: DC

## 2020-10-04 NOTE — Addendum Note (Signed)
Addended by: Resa Miner I on: 10/04/2020 03:05 PM   Modules accepted: Orders

## 2020-10-04 NOTE — Patient Instructions (Signed)
Medication Instructions:  Your physician has recommended you make the following change in your medication: INCREASE: Entresto 24/26 mg take one tablet by mouth twice daily.  *If you need a refill on your cardiac medications before your next appointment, please call your pharmacy*   Lab Work: None If you have labs (blood work) drawn today and your tests are completely normal, you will receive your results only by: [place] (if you have MyChart) OR A paper copy in the mail If you have any lab test that is abnormal or we need to change your treatment, we will call you to review the results.   Testing/Procedures: None   Follow-Up: At [facility], you and your health needs are our priority.  As part of our continuing mission to provide you with exceptional heart care, we have created designated Provider Care Teams.  These Care Teams include your primary Cardiologist (physician) and Advanced Practice Providers (APPs -  Physician Assistants and Nurse Practitioners) who all work together to provide you with the care you need, when you need it.  We recommend signing up for the patient portal called "MyChart".  Sign up information is provided on this After Visit Summary.  MyChart is used to connect with patients for Virtual Visits (Telemedicine).  Patients are able to view lab/test results, encounter notes, upcoming appointments, etc.  Non-urgent messages can be sent to your provider as well.   To learn more about what you can do with MyChart, go to [email or web address].    Your next appointment:   3 month(s)  The format for your next appointment:   In Person  Provider:   Shirlee More, MD   Other Instructions

## 2020-11-07 DIAGNOSIS — I25810 Atherosclerosis of coronary artery bypass graft(s) without angina pectoris: Secondary | ICD-10-CM | POA: Diagnosis not present

## 2020-11-07 DIAGNOSIS — Z9861 Coronary angioplasty status: Secondary | ICD-10-CM | POA: Diagnosis not present

## 2020-11-07 DIAGNOSIS — I480 Paroxysmal atrial fibrillation: Secondary | ICD-10-CM | POA: Diagnosis not present

## 2020-11-07 DIAGNOSIS — I5022 Chronic systolic (congestive) heart failure: Secondary | ICD-10-CM | POA: Diagnosis not present

## 2020-11-07 DIAGNOSIS — M7061 Trochanteric bursitis, right hip: Secondary | ICD-10-CM | POA: Diagnosis not present

## 2020-11-07 DIAGNOSIS — M25562 Pain in left knee: Secondary | ICD-10-CM | POA: Diagnosis not present

## 2020-11-07 DIAGNOSIS — D6869 Other thrombophilia: Secondary | ICD-10-CM | POA: Diagnosis not present

## 2020-11-07 DIAGNOSIS — R7301 Impaired fasting glucose: Secondary | ICD-10-CM | POA: Diagnosis not present

## 2020-11-08 DIAGNOSIS — N2889 Other specified disorders of kidney and ureter: Secondary | ICD-10-CM | POA: Diagnosis not present

## 2020-11-08 DIAGNOSIS — K8020 Calculus of gallbladder without cholecystitis without obstruction: Secondary | ICD-10-CM | POA: Diagnosis not present

## 2020-11-08 DIAGNOSIS — K5730 Diverticulosis of large intestine without perforation or abscess without bleeding: Secondary | ICD-10-CM | POA: Diagnosis not present

## 2020-11-08 DIAGNOSIS — D49511 Neoplasm of unspecified behavior of right kidney: Secondary | ICD-10-CM | POA: Diagnosis not present

## 2020-11-08 DIAGNOSIS — N261 Atrophy of kidney (terminal): Secondary | ICD-10-CM | POA: Diagnosis not present

## 2020-11-20 ENCOUNTER — Ambulatory Visit (INDEPENDENT_AMBULATORY_CARE_PROVIDER_SITE_OTHER): Payer: Medicare HMO

## 2020-11-20 DIAGNOSIS — R001 Bradycardia, unspecified: Secondary | ICD-10-CM

## 2020-11-20 DIAGNOSIS — I495 Sick sinus syndrome: Secondary | ICD-10-CM

## 2020-11-20 LAB — CUP PACEART REMOTE DEVICE CHECK
Date Time Interrogation Session: 20220725105813
Implantable Lead Implant Date: 20220124
Implantable Lead Implant Date: 20220124
Implantable Lead Location: 753859
Implantable Lead Location: 753860
Implantable Lead Model: 377
Implantable Lead Model: 377
Implantable Lead Serial Number: 8000155840
Implantable Lead Serial Number: 8000166398
Implantable Pulse Generator Implant Date: 20220124
Pulse Gen Model: 407145
Pulse Gen Serial Number: 70002016

## 2020-11-25 DIAGNOSIS — N1831 Chronic kidney disease, stage 3a: Secondary | ICD-10-CM | POA: Diagnosis not present

## 2020-11-25 DIAGNOSIS — E785 Hyperlipidemia, unspecified: Secondary | ICD-10-CM | POA: Diagnosis not present

## 2020-11-25 DIAGNOSIS — I130 Hypertensive heart and chronic kidney disease with heart failure and stage 1 through stage 4 chronic kidney disease, or unspecified chronic kidney disease: Secondary | ICD-10-CM | POA: Diagnosis not present

## 2020-11-25 DIAGNOSIS — I5033 Acute on chronic diastolic (congestive) heart failure: Secondary | ICD-10-CM | POA: Diagnosis not present

## 2020-12-11 DIAGNOSIS — M7062 Trochanteric bursitis, left hip: Secondary | ICD-10-CM | POA: Diagnosis not present

## 2020-12-11 DIAGNOSIS — M25562 Pain in left knee: Secondary | ICD-10-CM | POA: Diagnosis not present

## 2020-12-11 DIAGNOSIS — M7061 Trochanteric bursitis, right hip: Secondary | ICD-10-CM | POA: Diagnosis not present

## 2020-12-13 DIAGNOSIS — R350 Frequency of micturition: Secondary | ICD-10-CM | POA: Diagnosis not present

## 2020-12-13 DIAGNOSIS — D49511 Neoplasm of unspecified behavior of right kidney: Secondary | ICD-10-CM | POA: Diagnosis not present

## 2020-12-13 DIAGNOSIS — R3912 Poor urinary stream: Secondary | ICD-10-CM | POA: Diagnosis not present

## 2020-12-14 NOTE — Progress Notes (Signed)
Remote pacemaker transmission.   

## 2020-12-26 DIAGNOSIS — I5033 Acute on chronic diastolic (congestive) heart failure: Secondary | ICD-10-CM | POA: Diagnosis not present

## 2020-12-26 DIAGNOSIS — E785 Hyperlipidemia, unspecified: Secondary | ICD-10-CM | POA: Diagnosis not present

## 2020-12-26 DIAGNOSIS — I130 Hypertensive heart and chronic kidney disease with heart failure and stage 1 through stage 4 chronic kidney disease, or unspecified chronic kidney disease: Secondary | ICD-10-CM | POA: Diagnosis not present

## 2020-12-26 DIAGNOSIS — N1831 Chronic kidney disease, stage 3a: Secondary | ICD-10-CM | POA: Diagnosis not present

## 2020-12-27 ENCOUNTER — Encounter (HOSPITAL_COMMUNITY): Payer: Medicare HMO

## 2020-12-27 ENCOUNTER — Ambulatory Visit: Payer: Medicare HMO

## 2021-01-25 DIAGNOSIS — N1831 Chronic kidney disease, stage 3a: Secondary | ICD-10-CM | POA: Diagnosis not present

## 2021-01-25 DIAGNOSIS — E785 Hyperlipidemia, unspecified: Secondary | ICD-10-CM | POA: Diagnosis not present

## 2021-01-25 DIAGNOSIS — I130 Hypertensive heart and chronic kidney disease with heart failure and stage 1 through stage 4 chronic kidney disease, or unspecified chronic kidney disease: Secondary | ICD-10-CM | POA: Diagnosis not present

## 2021-01-25 DIAGNOSIS — I5033 Acute on chronic diastolic (congestive) heart failure: Secondary | ICD-10-CM | POA: Diagnosis not present

## 2021-01-28 ENCOUNTER — Other Ambulatory Visit: Payer: Self-pay

## 2021-01-28 ENCOUNTER — Ambulatory Visit (INDEPENDENT_AMBULATORY_CARE_PROVIDER_SITE_OTHER)
Admission: RE | Admit: 2021-01-28 | Discharge: 2021-01-28 | Disposition: A | Discharge status: Home / Self Care | Payer: Medicare HMO | Source: Ambulatory Visit | Attending: Physician Assistant | Admitting: Physician Assistant

## 2021-01-28 ENCOUNTER — Ambulatory Visit (HOSPITAL_COMMUNITY)
Admission: RE | Admit: 2021-01-28 | Discharge: 2021-01-28 | Disposition: A | Discharge status: Home / Self Care | Payer: Medicare HMO | Source: Ambulatory Visit | Attending: Physician Assistant | Admitting: Physician Assistant

## 2021-01-28 ENCOUNTER — Ambulatory Visit (INDEPENDENT_AMBULATORY_CARE_PROVIDER_SITE_OTHER): Payer: Medicare HMO | Admitting: Physician Assistant

## 2021-01-28 VITALS — BP 142/72 | HR 75 | Temp 97.5°F | Resp 16 | Ht 70.5 in | Wt 155.0 lb

## 2021-01-28 DIAGNOSIS — I6522 Occlusion and stenosis of left carotid artery: Secondary | ICD-10-CM

## 2021-01-28 DIAGNOSIS — I7025 Atherosclerosis of native arteries of other extremities with ulceration: Secondary | ICD-10-CM | POA: Diagnosis not present

## 2021-01-28 DIAGNOSIS — I6523 Occlusion and stenosis of bilateral carotid arteries: Secondary | ICD-10-CM | POA: Diagnosis not present

## 2021-01-28 DIAGNOSIS — I70219 Atherosclerosis of native arteries of extremities with intermittent claudication, unspecified extremity: Secondary | ICD-10-CM

## 2021-01-28 DIAGNOSIS — I70211 Atherosclerosis of native arteries of extremities with intermittent claudication, right leg: Secondary | ICD-10-CM | POA: Diagnosis not present

## 2021-01-28 NOTE — Progress Notes (Signed)
HISTORY AND PHYSICAL     CC:  follow up. Requesting Provider:  Ginger Organ., MD  HPI: This is a 87 y.o. male who underwent right carotid stent placement at [facility] in 2013 by Dr. Harvel Ricks.  The also is s/p right EIA, CFA and profunda femoral artery endarterectomy with bovine pericardial patch angioplasty, followed by a right common femoral to below knee popliteal artery bypass graft with 6 mm Gore-Tex on 08-03-15 by Dr. Trula Slade for a right heel ulcer and a right great toe ulcer, both of which have healed.     He has a history of CABG in 1996 x 4-5 vessels, off pump CABG x 26 September 2012 by Dr. Servando Snare.  He subsequently had a stent placed (PTCA with stent placement )at the end of March 2018 in [place], [facility] while he was on vacation there.   Patient underwent MRI of the abdomen with and without contrast on May 16, 2020 secondary to right renal mass.  Aortic atherosclerosis was noted without evidence of aneurysm in the abdominal vasculature.  Pt was last seen on 08/23/2020.  At that time, he was not having any claudication or rest pain and was feeling much better after his cardioversion.    The pt returns today for follow up.  Pt denies any amaurosis fugax, speech difficulties, weakness, numbness, paralysis or clumsiness or facial droop.    Pt denies claudication, rest pain, or non healing wounds.  He does have neuropathy and states that his legs tingle and burn.  He states this bothers him at night and it feels better when his legs are up.  He states that he has pain in his hips with the right being worse than the left.  He recently got a steroid injection in the right, which helped.    He states that he has been more tired lately.  He states his head hasn't really felt right and had a hard time explaining it.  States he forgets people's names that he knows.  He states that he had a bad headache about a month ago and has been worse since then.  He feels his balance is  a little off.  He bruises easily due to the blood thinners.  He states that since he got the PPM earlier this year, he does feel better.   He has been diagnosed with kidney cancer since his last visit, which is being monitored.    He and his wife have been married since 1968.  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   The pt is on a statin for cholesterol management.    The pt is not on an aspirin.    Other AC:  Plavix/Xarelto (PAF) The pt is on ARB for hypertension.  The pt does not have diabetes. Tobacco hx:  former   Past Medical History:  Diagnosis Date   AAA (abdominal aortic aneurysm) ([place])    2004, Pt stated not there now   Anginal pain ([place])    Anxiety    Arthritis    Atherosclerosis of native arteries of the extremities with intermittent claudication 12/04/2011   ABI .42 and .54 in 2012    Atherosclerotic PVD with intermittent claudication ([place]) 02/20/2014   BPH (benign prostatic hypertrophy)    Bradycardia 01/12/2019   Bradycardia on ECG 11/11/2017   CAD (coronary artery disease)    Carotid artery disease ([place])    Right carotid stent 2013   Carotid artery disease without cerebral infarction ([place])  S/p right carotid stent for asymptomatic disease  2013    Chronic diastolic (congestive) heart failure ([place]) 09/10/2019   Chronic kidney disease, stage 3a ([place]) 09/10/2019   Claudication ([place])    Colon polyps    adenomatous   Complex sleep apnea syndrome 11/11/2017   Conjunctival lesion 05/04/2014   Formatting of this note might be different from the original. left   Coronary artery disease    Coronary artery disease involving native coronary artery of native heart with unstable angina pectoris ([place]) 09/06/2012   CABG w LIMA to LAD, SVG to OM1-OM2, SVG to dx, SVG to PD/PL 1995,  Cypher stent x 2 SVG to RCA and Cypher stent to Circ Graft 05/13/07, stent of SVG to circ OM in [place] in July 2013  Cath 4/14 [facility] normal Left main, occluded mid LAD, occluded RCA SVG, occluded CFX, widely patent  OM 1 SVG, occluded RCA SVG, occluded Diag 1 SVG, widely patent LAD LIMA graft;  Redo CABG Dr. Servando Snare  10/18/12 with    Depression    Essential hypertension 01/12/2019   GERD (gastroesophageal reflux disease)    Gout    H/O hiatal hernia    Hyperlipidemia    Hypertension    Hypertension, renal disease, stage 1-4 or unspecified chronic kidney disease    Hypothyroidism    Malnutrition of moderate degree 08/05/2015   MI (myocardial infarction) ([place]) 4/14   Mixed hyperlipidemia    Neuropathy    Hx: of in B/L toes   OSA on CPAP 02/17/2018   PAD (peripheral artery disease) ([place]) 08/03/2015   PAF (paroxysmal atrial fibrillation) ([place])    Peripheral vascular disease ([place])    with claudication   Pre-diabetes    RBBB 01/12/2019   s/p CABG    Shoulder pain 01/27/2019   Stroke ([place])    TIA (transient ischemic attack)    Hx: of   Unstable angina ([place]) 09/10/2019    Past Surgical History:  Procedure Laterality Date   CARDIOVERSION N/A 02/22/2020   Procedure: CARDIOVERSION;  Surgeon: [name], MD;  Location: [place];  Service: Cardiovascular;  Laterality: N/A;   CAROTID STENT INSERTION Right 2013   right at [place] EXTRACTION Bilateral    Hx: of both eyes   COLONOSCOPY     Hx: of   COLONOSCOPY     CORONARY ANGIOPLASTY WITH STENT PLACEMENT  2010   CORONARY ARTERY BYPASS GRAFT  1995   CORONARY ARTERY BYPASS GRAFT N/A 10/18/2012   Procedure: REDO CORONARY ARTERY BYPASS GRAFTING (CABG);  Surgeon: Grace Isaac, MD;  Location: [place];  Service: Open Heart Surgery;  Laterality: N/A;  off pump times two using endoscopically harvested left saphenous vein   CORONARY STENT PLACEMENT  06/2016   ENDARTERECTOMY FEMORAL Right 08/03/2015   Procedure: RIGHT FEMORAL ENDARTERECTOMY WITH PATCH ANGIOPLASTY;  Surgeon: Serafina Mitchell, MD;  Location: [place];  Service: Vascular;  Laterality: Right;   FEMORAL-POPLITEAL BYPASS GRAFT Right 08/03/2015   Procedure: RIGHT FEMORAL-BELOW  KNEE POPLITEAL ARTERY BYPASS GRAFT;  Surgeon: Serafina Mitchell, MD;  Location: [place];  Service: Vascular;  Laterality: Right;   FOOT SURGERY Left    FRACTURE SURGERY Left    Hx: of Left heel   INGUINAL HERNIA REPAIR Bilateral    X 2    INTRAOPERATIVE TRANSESOPHAGEAL ECHOCARDIOGRAM N/A 10/18/2012   Procedure: INTRAOPERATIVE TRANSESOPHAGEAL ECHOCARDIOGRAM;  Surgeon: Grace Isaac, MD;  Location: [place];  Service: Open Heart Surgery;  Laterality: N/A;  LEFT HEART CATH AND CORS/GRAFTS ANGIOGRAPHY N/A 10/25/2019   Procedure: LEFT HEART CATH AND CORS/GRAFTS ANGIOGRAPHY;  Surgeon: Leonie Man, MD;  Location: [place] CV LAB;  Service: Cardiovascular;  Laterality: N/A;   LEFT HEART CATHETERIZATION WITH CORONARY/GRAFT ANGIOGRAM N/A 02/02/2013   Procedure: LEFT HEART CATHETERIZATION WITH Beatrix Fetters;  Surgeon: Jacolyn Reedy, MD;  Location: [facility] C[facility] LAB;  Service: Cardiovascular;  Laterality: N/A;   LOWER EXTREMITY ANGIOGRAM Right 06/27/2015   Procedure: Lower Extremity Angiogram;  Surgeon: Serafina Mitchell, MD;  Location: [place] CV LAB;  Service: Cardiovascular;  Laterality: Right;   MASTOIDECTOMY  1933   PACEMAKER IMPLANT N/A 05/21/2020   Procedure: PACEMAKER IMPLANT;  Surgeon: Vickie Epley, MD;  Location: [employer] CV LAB;  Service: Cardiovascular;  Laterality: N/A;   PERIPHERAL VASCULAR CATHETERIZATION N/A 01/23/2015   Procedure: Abdominal Aortogram;  Surgeon: Serafina Mitchell, MD;  Location: [place] CV LAB;  Service: Cardiovascular;  Laterality: N/A;   PERIPHERAL VASCULAR CATHETERIZATION N/A 06/27/2015   Procedure: Abdominal Aortogram;  Surgeon: Serafina Mitchell, MD;  Location: [place] CV LAB;  Service: Cardiovascular;  Laterality: N/A;   RETINAL DETACHMENT SURGERY Left    Hx: of left eye   TEE WITHOUT CARDIOVERSION N/A 02/22/2020   Procedure: TRANSESOPHAGEAL ECHOCARDIOGRAM (TEE);  Surgeon: [name], MD;  Location: [facility] ENDOSCOPY;  Service: Cardiovascular;   Laterality: N/A;   TONSILLECTOMY      Allergies  Allergen Reactions   Atorvastatin     Other reaction(s): myalgia   Amoxicillin Rash   Metoprolol Itching    When out in the sun, pt is currently taking.    Current Outpatient Medications  Medication Sig Dispense Refill   allopurinol (ZYLOPRIM) 100 MG tablet Take 100 mg by mouth daily.      amiodarone (PACERONE) 200 MG tablet Take 200 mg by mouth daily.     clopidogrel (PLAVIX) 75 MG tablet Take 1 tablet (75 mg total) by mouth daily.     escitalopram (LEXAPRO) 10 MG tablet Take 10 mg by mouth daily.     furosemide (LASIX) 40 MG tablet Take 40 mg by mouth daily.     isosorbide mononitrate (IMDUR) 30 MG 24 hr tablet Take 30 mg by mouth daily.     lansoprazole (PREVACID) 30 MG capsule Take 30 mg by mouth daily.     levothyroxine (SYNTHROID, LEVOTHROID) 88 MCG tablet Take 88 mcg by mouth daily before breakfast.     multivitamin (RENA-VIT) TABS tablet Take 1 tablet by mouth daily.     nitroGLYCERIN (NITROSTAT) 0.4 MG SL tablet Place 0.4 mg under the tongue every 5 (five) minutes as needed for chest pain.      rivaroxaban (XARELTO) 20 MG TABS tablet Take 1 tablet (20 mg total) by mouth daily with supper. 90 tablet 3   rosuvastatin (CRESTOR) 40 MG tablet Take 40 mg by mouth daily.     sacubitril-valsartan (ENTRESTO) 24-26 MG Take 1 tablet by mouth 2 (two) times daily. 180 tablet 3   No current facility-administered medications for this visit.    Family History  Problem Relation Age of Onset   Heart disease Mother        After 68 yrs of age   Heart attack Mother    Hyperlipidemia Father    Hypertension Father    Heart disease Father        After 28 yrs of age   Diabetes Father    Stroke Sister    Colon cancer  Neg Hx    Stomach cancer Neg Hx    Rectal cancer Neg Hx    Esophageal cancer Neg Hx    Liver disease Neg Hx    Kidney disease Neg Hx     Social History   Socioeconomic History   Marital status: Married    Spouse name:  Not on file   Number of children: 3   Years of education: Not on file   Highest education level: Not on file  Occupational History   Occupation: retired  Tobacco Use   Smoking status: Former    Types: Cigarettes    Quit date: 04/29/1975    Years since quitting: 45.7   Smokeless tobacco: Never  Vaping Use   Vaping Use: Never used  Substance and Sexual Activity   Alcohol use: Yes    Alcohol/week: 3.0 standard drinks    Types: 3 Glasses of wine per week    Comment: 3 glasses of wine today   Drug use: No   Sexual activity: Not Currently  Other Topics Concern   Not on file  Social History Narrative   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)   Social Determinants of Health   Financial Resource Strain: Not on file  Food Insecurity: Not on file  Transportation Needs: Not on file  Physical Activity: Not on file  Stress: Not on file  Social Connections: Not on file  Intimate Partner Violence: Not on file     REVIEW OF SYSTEMS:   [X]  denotes positive finding, [ ]  denotes negative finding Cardiac  Comments:  Chest pain or chest pressure:    Shortness of breath upon exertion:    Short of breath when lying flat:    Irregular heart rhythm:        Vascular    Pain in calf, thigh, or hip brought on by ambulation: x   Pain in feet at night that wakes you up from your sleep:  x   Blood clot in your veins:    Leg swelling:         Pulmonary    Oxygen at home:    Wheezing:         Neurologic    Sudden weakness in arms or legs:     Sudden numbness in arms or legs:     Sudden onset of difficulty speaking or understanding others    Temporary loss of vision in one eye:     Problems with dizziness:  x       Gastrointestinal    Blood in stool:     Vomited blood:         Genitourinary    Burning when urinating:     Blood in urine:        Psychiatric    Major depression:         Hematologic    Bleeding problems:    Problems with blood clotting too easily:        Skin    Rashes  or ulcers:        Constitutional    Fever or chills:      PHYSICAL EXAMINATION:  Today's Vitals   01/28/21 1405  BP: (!) 142/72  Pulse: 75  Resp: 16  Temp: (!) 97.5 F (36.4 C)  TempSrc: Temporal  SpO2: 98%  Weight: 155 lb (70.3 kg)  Height: 5' 10.5" (1.791 m)  PainSc: 9    Body mass index is 21.93 kg/m.   General:  WDWN in NAD; vital signs  documented above Gait: Not observed HENT: WNL, normocephalic Pulmonary: normal non-labored breathing  Cardiac: regular HR;  with carotid bruits bilaterally Abdomen: soft, NT, aortic pulse is not palpable Skin: without rashes Vascular Exam/Pulses:  Right Left  Radial 2+ (normal) 2+ (normal)  Femoral 2+ (normal) Unable to palpate but has brisk biphasic doppler signal  Popliteal Unable to palpate Unable to palpate  DP 2+ (normal) AT is monophasic  PT Unable to palpate monophasic  Peroneal Not examined monophasic   Extremities: without ischemic changes, without Gangrene , without cellulitis; without open wounds; left foot mildly ruborous  Musculoskeletal: no muscle wasting or atrophy  Neurologic: A&O X 3;  speech is fluent/normal; moving all extremities equally  Psychiatric:  The pt has Normal affect.   Non-Invasive Vascular Imaging:   ABI's/TBI's on 01/28/2021: Right:  1.03/0.64 (T) - great toe pressure:  103 Left:  0.64/0.35 - great toe pressure:  56   Arterial duplex on 01/28/2021: Right Graft #1: fem-pop bypass graft  +------------------+--------+--------+--------+--------+                    PSV cm/sStenosisWaveformComments  +------------------+--------+--------+--------+--------+  Inflow            72              biphasic          +------------------+--------+--------+--------+--------+  Prox Anastomosis  107             biphasic          +------------------+--------+--------+--------+--------+  Proximal Graft    82              biphasic           +------------------+--------+--------+--------+--------+  Mid Graft         60              biphasic          +------------------+--------+--------+--------+--------+  Distal Graft      79              biphasic          +------------------+--------+--------+--------+--------+  Distal Anastomosis88              biphasic          +------------------+--------+--------+--------+--------+  Outflow           70              biphasic          +------------------+--------+--------+--------+--------+   Summary:  Right: Patent left fem-pop bypass graft with no evidence of stenosis.  Non-Invasive Vascular Imaging:   Carotid Duplex on 01/28/2021: Right:  no evidence of stenosis Left:  60-79% ICA stenosis Vertebrals:  Bilateral vertebral arteries demonstrate antegrade flow.  Subclavians: Normal flow hemodynamics were seen in bilateral subclavian arteries.  RLE arterial duplex 01/28/2021: Right: Patent left fem-pop bypass graft with no evidence of stenosis.  Previous ABI's/TBI's on 08/23/2020: Right:  1.01/0.67 - great toe pressure:  106 Left:  0.65/0.41 - great toe pressure:  64  Previous Carotid duplex on 07/25/2020: Right: no evidence of stenosis Left:   60-79% ICA stenosis    ASSESSMENT/PLAN:: 87 y.o. male here for follow up for PAD and carotid artery stenosis with hx of right carotid stent placement at [facility] in 2013 by Dr. Harvel Ricks.  The also is s/p right EIA, CFA and profunda femoral artery endarterectomy with bovine pericardial patch angioplasty, followed by a right common femoral to below knee popliteal artery bypass graft with 6  mm Gore-Tex on 08-03-15 by Dr. Trula Slade for a right heel ulcer and a right great toe ulcer, both of which have healed.     PAD -pt with palpable right DP pulse.  His ABI are essentially unchanged from last visit.   -pt does not have rest pain, claudication, non healing wounds. -continue graduated walking program -he knows to contact us sooner  if he develops wounds or rest pain.  He does not walk enough to elicit claudication.   -pt will f/u in one year with RLE arterial duplex and ABI  Carotid stenosis -duplex today reveals right stent patent without evidence of stenosis.  The left is still in the 60-79% range with the velocities lower than last visit.  The vertebrals are antegrade  and normal flow within the subclavian arteries. -discussed s/s of stroke/TIA and if pt develops any sx, they know to call 911 or go to the emergency room -pt will f/u in 6 months with carotid duplex  -continue statin/plavix -recommend following up with his PCP about his overall general issues.   Leontine Locket, [facility] [phone]  Clinic MD:   Trula Slade

## 2021-01-30 ENCOUNTER — Other Ambulatory Visit: Payer: Self-pay

## 2021-01-30 DIAGNOSIS — I6523 Occlusion and stenosis of bilateral carotid arteries: Secondary | ICD-10-CM

## 2021-01-31 DIAGNOSIS — Z23 Encounter for immunization: Secondary | ICD-10-CM | POA: Diagnosis not present

## 2021-01-31 DIAGNOSIS — I739 Peripheral vascular disease, unspecified: Secondary | ICD-10-CM | POA: Diagnosis not present

## 2021-01-31 DIAGNOSIS — I480 Paroxysmal atrial fibrillation: Secondary | ICD-10-CM | POA: Diagnosis not present

## 2021-01-31 DIAGNOSIS — I25810 Atherosclerosis of coronary artery bypass graft(s) without angina pectoris: Secondary | ICD-10-CM | POA: Diagnosis not present

## 2021-01-31 DIAGNOSIS — G629 Polyneuropathy, unspecified: Secondary | ICD-10-CM | POA: Diagnosis not present

## 2021-01-31 DIAGNOSIS — D6869 Other thrombophilia: Secondary | ICD-10-CM | POA: Diagnosis not present

## 2021-01-31 DIAGNOSIS — N1831 Chronic kidney disease, stage 3a: Secondary | ICD-10-CM | POA: Diagnosis not present

## 2021-01-31 DIAGNOSIS — N2889 Other specified disorders of kidney and ureter: Secondary | ICD-10-CM | POA: Diagnosis not present

## 2021-01-31 DIAGNOSIS — R5383 Other fatigue: Secondary | ICD-10-CM | POA: Diagnosis not present

## 2021-01-31 DIAGNOSIS — E785 Hyperlipidemia, unspecified: Secondary | ICD-10-CM | POA: Diagnosis not present

## 2021-01-31 DIAGNOSIS — E039 Hypothyroidism, unspecified: Secondary | ICD-10-CM | POA: Diagnosis not present

## 2021-01-31 DIAGNOSIS — I10 Essential (primary) hypertension: Secondary | ICD-10-CM | POA: Diagnosis not present

## 2021-01-31 DIAGNOSIS — D649 Anemia, unspecified: Secondary | ICD-10-CM | POA: Diagnosis not present

## 2021-02-05 NOTE — Progress Notes (Signed)
Cardiology Office Note:    Date:  02/06/2021   ID:  Ian Cummings, DOB Apr 05, 1929, MRN [id number]  PCP:  Ginger Organ., MD  Cardiologist:  Shirlee More, MD    Referring MD: Ginger Organ., MD    ASSESSMENT:    1. Coronary artery disease of native artery of native heart with stable angina pectoris (HCC)   2. Paroxysmal atrial fibrillation ([place])   3. Chronic anticoagulation   4. On amiodarone therapy   5. Pacemaker    PLAN:    In order of problems listed above:  In general doing well stable CAD maintaining sinus rhythm with amiodarone heart failure compensated.  His predominant problem is weakness check thyroid CBC anticoagulated decrease diuretic to as needed decrease his statin and encouraged him to enjoy life fully Stable pacemaker function   Next appointment: 6 months   Medication Adjustments/Labs and Tests Ordered: Current medicines are reviewed at length with the patient today.  Concerns regarding medicines are outlined above.  No orders of the defined types were placed in this encounter.  No orders of the defined types were placed in this encounter.   Follow-up for heart failure and atrial fibrillation on amiodarone   History of Present Illness:    Ian Cummings is a 87 y.o. 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  After discussions with the patient wife and urologist a decision was made not to pursue nephrectomy.  He was last seen 10/04/2020 and was  markedly improved functionally maintaining sinus rhythm with amiodarone no recurrent syncope following pacemaker and compensated heart failure on Entresto and no further symptomatic hypotension.  Compliance with diet, lifestyle and medications: Yes but he reduced his diuretic to every other day  He is having a big 89th birthday party in [place] hosted by his son He feels increasingly weak he is a little desponded and is worried about his son is dependent on him however will do when he is gone. He is weak with activities but no shortness of breath chest pain or syncope. He is at risk for thyroid side effects and we will recheck his thyroid studies on amiodarone or get a backup and take his diuretic only if there is a weight increase to try to help with his weakness and when reduce the dose of his statin by 50%. He has had no bleeding from his anticoagulant and antiplatelet Past Medical History:  Diagnosis Date   AAA (abdominal aortic aneurysm)    2004, Pt stated not there now   Anginal pain ([place])    Anxiety    Arthritis    Atherosclerosis of native arteries of the extremities with intermittent claudication 12/04/2011   ABI .23 and .54 in 2012    Atherosclerotic PVD with intermittent claudication ([place]) 02/20/2014   BPH (benign prostatic hypertrophy)    Bradycardia 01/12/2019   Bradycardia on ECG 11/11/2017   CAD (coronary artery disease)    Carotid artery disease ([place])    Right carotid stent 2013   Carotid artery disease without cerebral infarction [facility])    S/p right carotid  stent for asymptomatic disease  2013    Chronic diastolic (congestive) heart failure ([place]) 09/10/2019   Chronic kidney disease, stage 3a ([place]) 09/10/2019   Claudication ([place])    Colon polyps    adenomatous   Complex sleep apnea syndrome 11/11/2017   Conjunctival lesion 05/04/2014   Formatting of this note might be different from the original. left   Coronary artery disease    Coronary artery disease involving native  coronary artery of native heart with unstable angina pectoris ([place]) 09/06/2012   CABG w LIMA to LAD, SVG to OM1-OM2, SVG to dx, SVG to PD/PL 1995,  Cypher stent x 2 SVG to RCA and Cypher stent to Circ Graft 05/13/07, stent of SVG to circ OM in [place] in July 2013  Cath 4/14 [facility] normal Left main, occluded mid LAD, occluded RCA SVG, occluded CFX, widely patent OM 1 SVG, occluded RCA SVG, occluded Diag 1 SVG, widely patent LAD LIMA graft;  Redo CABG Dr. Servando Snare  10/18/12 with    Depression    Essential hypertension 01/12/2019   GERD (gastroesophageal reflux disease)    Gout    H/O hiatal hernia    Hyperlipidemia    Hypertension    Hypertension, renal disease, stage 1-4 or unspecified chronic kidney disease    Hypothyroidism    Malnutrition of moderate degree 08/05/2015   MI (myocardial infarction) ([place]) 4/14   Mixed hyperlipidemia    Neuropathy    Hx: of in B/L toes   OSA on CPAP 02/17/2018   PAD (peripheral artery disease) ([place]) 08/03/2015   PAF (paroxysmal atrial fibrillation) ([place])    Peripheral vascular disease ([place])    with claudication   Pre-diabetes    RBBB 01/12/2019   s/p CABG    Shoulder pain 01/27/2019   Stroke ([place])    TIA (transient ischemic attack)    Hx: of   Unstable angina ([place]) 09/10/2019    Past Surgical History:  Procedure Laterality Date   CARDIOVERSION N/A 02/22/2020   Procedure: CARDIOVERSION;  Surgeon: [name], MD;  Location: [place];  Service: Cardiovascular;  Laterality: N/A;   CAROTID STENT INSERTION Right 2013   right at [place] EXTRACTION Bilateral    Hx: of both eyes   COLONOSCOPY     Hx: of   COLONOSCOPY     CORONARY ANGIOPLASTY WITH STENT PLACEMENT  2010   CORONARY ARTERY BYPASS GRAFT  1995   CORONARY ARTERY BYPASS GRAFT N/A 10/18/2012   Procedure: REDO CORONARY ARTERY BYPASS GRAFTING (CABG);  Surgeon: Grace Isaac, MD;  Location: [place];  Service: Open Heart Surgery;  Laterality: N/A;  off pump times two using  endoscopically harvested left saphenous vein   CORONARY STENT PLACEMENT  06/2016   ENDARTERECTOMY FEMORAL Right 08/03/2015   Procedure: RIGHT FEMORAL ENDARTERECTOMY WITH PATCH ANGIOPLASTY;  Surgeon: Serafina Mitchell, MD;  Location: [place];  Service: Vascular;  Laterality: Right;   FEMORAL-POPLITEAL BYPASS GRAFT Right 08/03/2015   Procedure: RIGHT FEMORAL-BELOW KNEE POPLITEAL ARTERY BYPASS GRAFT;  Surgeon: Serafina Mitchell, MD;  Location: [place];  Service: Vascular;  Laterality: Right;   FOOT SURGERY Left    FRACTURE SURGERY Left    Hx: of Left heel   INGUINAL HERNIA REPAIR Bilateral    X 2    INTRAOPERATIVE TRANSESOPHAGEAL ECHOCARDIOGRAM N/A 10/18/2012   Procedure: INTRAOPERATIVE TRANSESOPHAGEAL ECHOCARDIOGRAM;  Surgeon: Grace Isaac, MD;  Location: [place];  Service: Open Heart Surgery;  Laterality: N/A;   LEFT HEART CATH  AND CORS/GRAFTS ANGIOGRAPHY N/A 10/25/2019   Procedure: LEFT HEART CATH AND CORS/GRAFTS ANGIOGRAPHY;  Surgeon: Leonie Man, MD;  Location: [place] CV LAB;  Service: Cardiovascular;  Laterality: N/A;   LEFT HEART CATHETERIZATION WITH CORONARY/GRAFT ANGIOGRAM N/A 02/02/2013   Procedure: LEFT HEART CATHETERIZATION WITH Beatrix Fetters;  Surgeon: Jacolyn Reedy, MD;  Location: [facility] CATH LAB;  Service: Cardiovascular;  Laterality: N/A;   LOWER EXTREMITY ANGIOGRAM Right 06/27/2015   Procedure: Lower Extremity Angiogram;  Surgeon: Serafina Mitchell, MD;  Location: [place] CV LAB;  Service: Cardiovascular;  Laterality: Right;   MASTOIDECTOMY  1933   PACEMAKER IMPLANT N/A 05/21/2020   Procedure: PACEMAKER IMPLANT;  Surgeon: Vickie Epley, MD;  Location: [place] CV LAB;  Service: Cardiovascular;  Laterality: N/A;   PERIPHERAL VASCULAR CATHETERIZATION N/A 01/23/2015   Procedure: Abdominal Aortogram;  Surgeon: Serafina Mitchell, MD;  Location: [place] CV LAB;  Service: Cardiovascular;  Laterality: N/A;   PERIPHERAL VASCULAR CATHETERIZATION N/A 06/27/2015   Procedure:  Abdominal Aortogram;  Surgeon: Serafina Mitchell, MD;  Location: [place] CV LAB;  Service: Cardiovascular;  Laterality: N/A;   RETINAL DETACHMENT SURGERY Left    Hx: of left eye   TEE WITHOUT CARDIOVERSION N/A 02/22/2020   Procedure: TRANSESOPHAGEAL ECHOCARDIOGRAM (TEE);  Surgeon: [name], MD;  Location: [facility] ENDOSCOPY;  Service: Cardiovascular;  Laterality: N/A;   TONSILLECTOMY      Current Medications: Current Meds  Medication Sig   allopurinol (ZYLOPRIM) 100 MG tablet Take 100 mg by mouth daily.    amiodarone (PACERONE) 200 MG tablet Take 200 mg by mouth daily.   clopidogrel (PLAVIX) 75 MG tablet Take 1 tablet (75 mg total) by mouth daily.   escitalopram (LEXAPRO) 10 MG tablet Take 10 mg by mouth daily.   furosemide (LASIX) 40 MG tablet Take 40 mg by mouth daily.   isosorbide mononitrate (IMDUR) 30 MG 24 hr tablet Take 30 mg by mouth daily.   lansoprazole (PREVACID) 30 MG capsule Take 30 mg by mouth daily.   levothyroxine (SYNTHROID, LEVOTHROID) 88 MCG tablet Take 88 mcg by mouth daily before breakfast.   multivitamin (RENA-VIT) TABS tablet Take 1 tablet by mouth daily.   nitroGLYCERIN (NITROSTAT) 0.4 MG SL tablet Place 0.4 mg under the tongue every 5 (five) minutes as needed for chest pain.    rivaroxaban (XARELTO) 20 MG TABS tablet Take 1 tablet (20 mg total) by mouth daily with supper.   rosuvastatin (CRESTOR) 40 MG tablet Take 40 mg by mouth daily.   sacubitril-valsartan (ENTRESTO) 24-26 MG Take 1 tablet by mouth 2 (two) times daily.     Allergies:   Atorvastatin, Amoxicillin, and Metoprolol   Social History   Socioeconomic History   Marital status: Married    Spouse name: Not on file   Number of children: 3   Years of education: Not on file   Highest education level: Not on file  Occupational History   Occupation: retired  Tobacco Use   Smoking status: Former    Types: Cigarettes    Quit date: 04/29/1975    Years since quitting: 45.8   Smokeless tobacco:  Never  Vaping Use   Vaping Use: Never used  Substance and Sexual Activity   Alcohol use: Yes    Alcohol/week: 3.0 standard drinks    Types: 3 Glasses of wine per week    Comment: 3 glasses of wine today   Drug use: No   Sexual activity: Not Currently  Other Topics Concern  Not on file  Social History Narrative   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 There is no history of Colon cancer, Stomach cancer, Rectal cancer, Esophageal cancer, Liver disease, or Kidney disease. ROS:   Please see the history of present illness.    All other systems reviewed and are negative.  EKGs/Labs/Other Studies Reviewed:    The following studies were reviewed today:  EKG:  EKG ordered today and personally reviewed.  The ekg ordered today demonstrates atrially paced rhythm nonspecific T waves  Recent Labs: 05/16/2020: Hemoglobin 12.0; Platelets 167 07/25/2020: NT-Pro BNP 775 08/28/2020: ALT 45; BUN 29; Creatinine, Ser 1.45; Potassium 4.1; Sodium 141; TSH 4.170  Recent Lipid Panel    Component Value Date/Time   CHOL 121 01/23/2020 1040   TRIG 191 (H) 01/23/2020 1040   HDL 30 (L) 01/23/2020 1040   CHOLHDL 4.0 01/23/2020 1040   CHOLHDL 3.2 04/18/2009 0241   VLDL 23 04/18/2009 0241   LDLCALC 59 01/23/2020 1040    Physical Exam:    VS:  BP 124/72 (BP Location: Right Arm, Patient Position: Sitting, Cuff Size: Normal)   Pulse 75   Ht 5' 10.5" (1.791 m)   Wt 155 lb (70.3 kg)   SpO2 96%   BMI 21.93 kg/m     Wt Readings from Last 3 Encounters:   02/06/21 155 lb (70.3 kg)  01/28/21 155 lb (70.3 kg)  10/04/20 155 lb (70.3 kg)     GEN:  Well nourished, well developed in no acute distress HEENT: Normal NECK: No JVD; No carotid bruits LYMPHATICS: No lymphadenopathy CARDIAC: RRR, no murmurs, rubs, gallops RESPIRATORY:  Clear to auscultation without rales, wheezing or rhonchi  ABDOMEN: Soft, non-tender, non-distended MUSCULOSKELETAL:  No edema; No deformity  SKIN: Warm and dry NEUROLOGIC:  Alert and oriented x 3 PSYCHIATRIC:  Normal affect    Signed, Shirlee More, MD  02/06/2021 1:46 PM    [facility]

## 2021-02-06 ENCOUNTER — Encounter: Payer: Self-pay | Admitting: Cardiology

## 2021-02-06 ENCOUNTER — Ambulatory Visit: Payer: Medicare HMO | Admitting: Cardiology

## 2021-02-06 ENCOUNTER — Other Ambulatory Visit: Payer: Self-pay

## 2021-02-06 VITALS — BP 124/72 | HR 75 | Ht 70.5 in | Wt 155.0 lb

## 2021-02-06 DIAGNOSIS — I480 Paroxysmal atrial fibrillation: Secondary | ICD-10-CM | POA: Diagnosis not present

## 2021-02-06 DIAGNOSIS — Z950 Presence of cardiac pacemaker: Secondary | ICD-10-CM | POA: Diagnosis not present

## 2021-02-06 DIAGNOSIS — Z79899 Other long term (current) drug therapy: Secondary | ICD-10-CM | POA: Diagnosis not present

## 2021-02-06 DIAGNOSIS — Z7901 Long term (current) use of anticoagulants: Secondary | ICD-10-CM

## 2021-02-06 DIAGNOSIS — I25118 Atherosclerotic heart disease of native coronary artery with other forms of angina pectoris: Secondary | ICD-10-CM | POA: Diagnosis not present

## 2021-02-06 NOTE — Patient Instructions (Signed)
Medication Instructions:  Your physician has recommended you make the following change in your medication:  TAKE : Lasix daily only if weight is above 157lbs  *If you need a refill on your cardiac medications before your next appointment, please call your pharmacy*   Lab Work: Your physician recommends that you return for lab work today: cmp, cbc, tsh, t3, t4 If you have labs (blood work) drawn today and your tests are completely normal, you will receive your results only by: [place] (if you have MyChart) OR A paper copy in the mail If you have any lab test that is abnormal or we need to change your treatment, we will call you to review the results.   Testing/Procedures: None   Follow-Up: At [facility], you and your health needs are our priority.  As part of our continuing mission to provide you with exceptional heart care, we have created designated Provider Care Teams.  These Care Teams include your primary Cardiologist (physician) and Advanced Practice Providers (APPs -  Physician Assistants and Nurse Practitioners) who all work together to provide you with the care you need, when you need it.  We recommend signing up for the patient portal called "MyChart".  Sign up information is provided on this After Visit Summary.  MyChart is used to connect with patients for Virtual Visits (Telemedicine).  Patients are able to view lab/test results, encounter notes, upcoming appointments, etc.  Non-urgent messages can be sent to your provider as well.   To learn more about what you can do with MyChart, go to [email or web address].    Your next appointment:   3 month(s)  The format for your next appointment:   In Person  Provider:   Shirlee More, MD   Other Instructions

## 2021-02-07 ENCOUNTER — Telehealth: Payer: Self-pay

## 2021-02-07 ENCOUNTER — Other Ambulatory Visit: Payer: Self-pay

## 2021-02-07 DIAGNOSIS — I10 Essential (primary) hypertension: Secondary | ICD-10-CM

## 2021-02-07 DIAGNOSIS — R7989 Other specified abnormal findings of blood chemistry: Secondary | ICD-10-CM

## 2021-02-07 LAB — COMPREHENSIVE METABOLIC PANEL
ALT: 42 IU/L (ref 0–44)
AST: 41 IU/L — ABNORMAL HIGH (ref 0–40)
Albumin/Globulin Ratio: 1.3 (ref 1.2–2.2)
Albumin: 3.9 g/dL (ref 3.6–4.6)
Alkaline Phosphatase: 52 IU/L (ref 44–121)
BUN/Creatinine Ratio: 20 (ref 10–24)
BUN: 26 mg/dL (ref 8–27)
Bilirubin Total: 0.5 mg/dL (ref 0.0–1.2)
CO2: 23 mmol/L (ref 20–29)
Calcium: 8.9 mg/dL (ref 8.6–10.2)
Chloride: 100 mmol/L (ref 96–106)
Creatinine, Ser: 1.33 mg/dL — ABNORMAL HIGH (ref 0.76–1.27)
Globulin, Total: 3.1 g/dL (ref 1.5–4.5)
Glucose: 94 mg/dL (ref 70–99)
Potassium: 4.3 mmol/L (ref 3.5–5.2)
Sodium: 138 mmol/L (ref 134–144)
Total Protein: 7 g/dL (ref 6.0–8.5)
eGFR: 51 mL/min/{1.73_m2} — ABNORMAL LOW (ref >59)

## 2021-02-07 LAB — CBC
Hematocrit: 37 % — ABNORMAL LOW (ref 37.5–51.0)
Hemoglobin: 12.2 g/dL — ABNORMAL LOW (ref 13.0–17.7)
MCH: 31 pg (ref 26.6–33.0)
MCHC: 33 g/dL (ref 31.5–35.7)
MCV: 94 fL (ref 79–97)
Platelets: 195 10*3/uL (ref 150–450)
RBC: 3.94 x10E6/uL — ABNORMAL LOW (ref 4.14–5.80)
RDW: 13.6 % (ref 11.6–15.4)
WBC: 5.6 10*3/uL (ref 3.4–10.8)

## 2021-02-07 LAB — T3: T3, Total: 82 ng/dL (ref 71–180)

## 2021-02-07 LAB — TSH: TSH: 23.4 u[IU]/mL — ABNORMAL HIGH (ref 0.450–4.500)

## 2021-02-07 LAB — T4: T4, Total: 6.7 ug/dL (ref 4.5–12.0)

## 2021-02-07 MED ORDER — LEVOTHYROXINE SODIUM 100 MCG PO TABS: 100.0000 ug | ORAL_TABLET | Freq: Every day | ORAL | 3 refills | Status: DC

## 2021-02-07 NOTE — Telephone Encounter (Signed)
-----   Message from Richardo Priest, MD sent at 02/07/2021  7:45 AM EDT ----- Labs are good except markedly elevated TSH  He is quite symptomatic I would like to start him on Synthroid 0.050 mg daily 1 month recheck TSH and the free T3 free T4 unfortunately we did totals rather than free this time

## 2021-02-07 NOTE — Telephone Encounter (Signed)
Spoke with patient regarding results and recommendation.  Patient verbalizes understanding and is agreeable to plan of care. Advised patient to call back with any issues or concerns.  

## 2021-02-07 NOTE — Telephone Encounter (Signed)
-----   Message from Richardo Priest, MD sent at 02/07/2021  8:51 AM EDT ----- Lets change him to 0.100 Synthroid ----- Message ----- From: Gita Kudo, RN Sent: 02/07/2021   8:18 AM EDT To: Richardo Priest, MD  It looks like he is already on synthroid at 88 mcg. Should he follow up with his PCP for this?  Lilia Pro, RN ----- Message ----- From: Richardo Priest, MD Sent: 02/07/2021   7:45 AM EDT To: Rebeca Alert Ash/Hp Triage  Labs are good except markedly elevated TSH  He is quite symptomatic I would like to start him on Synthroid 0.050 mg daily 1 month recheck TSH and the free T3 free T4 unfortunately we did totals rather than free this time

## 2021-02-12 DIAGNOSIS — N401 Enlarged prostate with lower urinary tract symptoms: Secondary | ICD-10-CM | POA: Diagnosis not present

## 2021-02-12 DIAGNOSIS — D49511 Neoplasm of unspecified behavior of right kidney: Secondary | ICD-10-CM | POA: Diagnosis not present

## 2021-02-12 DIAGNOSIS — R350 Frequency of micturition: Secondary | ICD-10-CM | POA: Diagnosis not present

## 2021-02-19 ENCOUNTER — Ambulatory Visit (INDEPENDENT_AMBULATORY_CARE_PROVIDER_SITE_OTHER): Payer: Medicare HMO

## 2021-02-19 DIAGNOSIS — I480 Paroxysmal atrial fibrillation: Secondary | ICD-10-CM | POA: Diagnosis not present

## 2021-02-19 LAB — CUP PACEART REMOTE DEVICE CHECK
Date Time Interrogation Session: 20221025092611
Implantable Lead Implant Date: 20220124
Implantable Lead Implant Date: 20220124
Implantable Lead Location: 753859
Implantable Lead Location: 753860
Implantable Lead Model: 377
Implantable Lead Model: 377
Implantable Lead Serial Number: 8000155840
Implantable Lead Serial Number: 8000166398
Implantable Pulse Generator Implant Date: 20220124
Pulse Gen Model: 407145
Pulse Gen Serial Number: 70002016

## 2021-02-22 DIAGNOSIS — I480 Paroxysmal atrial fibrillation: Secondary | ICD-10-CM | POA: Diagnosis not present

## 2021-02-22 DIAGNOSIS — I5022 Chronic systolic (congestive) heart failure: Secondary | ICD-10-CM | POA: Diagnosis not present

## 2021-02-22 DIAGNOSIS — L0889 Other specified local infections of the skin and subcutaneous tissue: Secondary | ICD-10-CM | POA: Diagnosis not present

## 2021-02-22 DIAGNOSIS — N1831 Chronic kidney disease, stage 3a: Secondary | ICD-10-CM | POA: Diagnosis not present

## 2021-02-22 DIAGNOSIS — I129 Hypertensive chronic kidney disease with stage 1 through stage 4 chronic kidney disease, or unspecified chronic kidney disease: Secondary | ICD-10-CM | POA: Diagnosis not present

## 2021-02-22 DIAGNOSIS — M79671 Pain in right foot: Secondary | ICD-10-CM | POA: Diagnosis not present

## 2021-02-22 DIAGNOSIS — D6869 Other thrombophilia: Secondary | ICD-10-CM | POA: Diagnosis not present

## 2021-02-24 DIAGNOSIS — R0602 Shortness of breath: Secondary | ICD-10-CM | POA: Diagnosis not present

## 2021-02-24 DIAGNOSIS — R059 Cough, unspecified: Secondary | ICD-10-CM | POA: Diagnosis not present

## 2021-02-24 DIAGNOSIS — R0689 Other abnormalities of breathing: Secondary | ICD-10-CM | POA: Diagnosis not present

## 2021-02-24 DIAGNOSIS — R6883 Chills (without fever): Secondary | ICD-10-CM | POA: Diagnosis not present

## 2021-02-24 DIAGNOSIS — I509 Heart failure, unspecified: Secondary | ICD-10-CM | POA: Diagnosis not present

## 2021-02-24 LAB — BASIC METABOLIC PANEL
Anion Gap: 15 mmol/L (ref 2–17)
BUN: 17 mg/dL (ref 8–23)
CO2: 22 mmol/L (ref 22–29)
Calcium: 9.1 mg/dL (ref 8.8–10.2)
Chloride: 104 mmol/L (ref 98–107)
Creatinine: 1.1 mg/dL (ref 0.7–1.3)
Est, Glom Filt Rate: 64 mL/min/1.73m?? — ABNORMAL LOW (ref >=90)
Glucose: 126 mg/dL — ABNORMAL HIGH (ref 70–99)
OSMOLALITY CALCULATED: 284 mOsm/kg (ref 270–287)
Potassium: 3.9 mmol/L (ref 3.5–5.3)
Sodium: 141 mmol/L (ref 135–145)

## 2021-02-24 LAB — CBC WITH AUTO DIFFERENTIAL
Absolute Baso #: 0 10*3/uL (ref 0.0–0.2)
Absolute Eos #: 0 10*3/uL (ref 0.0–0.5)
Absolute Lymph #: 0.9 10*3/uL — ABNORMAL LOW (ref 1.0–3.2)
Absolute Mono #: 0.7 10*3/uL (ref 0.3–1.0)
Basophils %: 0.1 % (ref 0.0–2.0)
Eosinophils %: 0.4 % (ref 0.0–7.0)
Hematocrit: 40.5 % (ref 38.0–52.0)
Hemoglobin: 13.1 g/dL (ref 13.0–17.3)
Immature Grans (Abs): 0.02 10*3/uL (ref 0.00–0.06)
Immature Granulocytes: 0.3 % (ref 0.0–0.6)
Lymphocytes: 12.4 % — ABNORMAL LOW (ref 15.0–45.0)
MCH: 31.3 pg (ref 27.0–34.5)
MCHC: 32.3 g/dL (ref 32.0–36.0)
MCV: 96.9 fL (ref 84.0–100.0)
MPV: 10.3 fL (ref 7.2–13.2)
Monocytes: 9.7 % (ref 4.0–12.0)
Neutrophils %: 77.1 % — ABNORMAL HIGH (ref 42.0–74.0)
Neutrophils Absolute: 5.4 10*3/uL (ref 1.6–7.3)
Platelets: 179 10*3/uL (ref 140–440)
RBC: 4.18 x10e6/mcL (ref 4.00–5.60)
RDW: 15.2 % (ref 11.0–16.0)
WBC: 7 10*3/uL (ref 3.8–10.6)

## 2021-02-24 LAB — N TERMINAL PROBNP (AKA NTPROBNP): NT Pro-BNP: 8684 pg/mL — ABNORMAL HIGH (ref 0–450)

## 2021-02-24 LAB — COVID-19 & INFLUENZA COMBO
INFLUENZA A: NOT DETECTED
INFLUENZA B: NOT DETECTED
SARS-CoV-2: NOT DETECTED

## 2021-02-24 LAB — TROPONIN T: Troponin T: <0.01 ng/mL (ref 0.000–0.010)

## 2021-02-24 NOTE — ED Provider Notes (Signed)
Dyspnea *ED        Patient:   Nicholas Molina, Nicholas Molina            MRN: [id number]            FIN: [phone]               Age:   87 years     Sex:  Male     DOB:  1929/04/09   Associated Diagnoses:   Acute CHF   Author:   Duwaine Maxin THOMAS-MD      Basic Information   Time seen: Provider Seen (ST)   ED Provider/Time:    Duwaine Maxin THOMAS-MD / 02/24/2021 10:29  .   Additional information: Chief Complaint from Nursing Triage Note   Chief Complaint  Chief Complaint: woke this morning with SOB, states started yesterday. has been coughing for few days. mild discomfort/dull/tightness in chest. (02/24/21 10:29:00).      History of Present Illness   The patient presents with 87 year old male presents here for evaluation of cough and shortness of breath.  Patient states he started coughing about 2 days ago.  He states he is coughing up some mucus but does not sound like any sputum.  He does feel short of breath.  He denies having any chest pain.  He denies fevers, but endorses chills.  He has no back pain.  No abdominal pain, nausea, vomiting.  He has no myalgias.  He tells me that a few weeks ago he was taken off of Lasix and is concerned that he might be fluid overloaded..        Review of Systems   Constitutional symptoms:  Chills, no fever, no generalized weakness.    Skin symptoms:  No rash, no lesion.    Eye symptoms:  Vision unchanged, No blurred vision,    ENMT symptoms:  No sore throat, no sinus pain.    Respiratory symptoms:  Shortness of breath, cough.    Cardiovascular symptoms:  No chest pain, no palpitations, no syncope, no diaphoresis, no peripheral edema.    Gastrointestinal symptoms:  No abdominal pain, no nausea, no vomiting.    Musculoskeletal symptoms:  No back pain, no Muscle pain.    Neurologic symptoms:  No headache, no dizziness.    Psychiatric symptoms:  No anxiety, no depression.       Health Status   Allergies:    Allergic Reactions (Selected)  Severe  Augmentin- Throat swelling..      Past  Medical/ Family/ Social History   Surgical history: Reviewed as documented in chart.   Family history: Reviewed as documented in chart.   Social history: Reviewed as documented in chart.   Problem list:    No qualifying data available  .      Physical Examination               Vital Signs   Vital Signs   02/24/2021 10:29 EDT Systolic Blood Pressure 184 mmHg  >HHI    Diastolic Blood Pressure 90 mmHg    Temperature Oral 36.7 degC    Heart Rate Monitored 74 bpm    Respiratory Rate 27 br/min  HI    SpO2 92 %   .   Measurements   02/24/2021 10:31 EDT Body Mass Index est meas 22.22 kg/m2    Body Mass Index Measured 22.22 kg/m2   02/24/2021 10:29 EDT Height/Length Measured 180 cm    Weight Dosing 72 kg   .   Basic  Oxygen Information   02/24/2021 10:29 EDT Oxygen Therapy Room air    SpO2 92 %   .   General:  Alert, no acute distress.    Skin:  Warm, dry, intact.    Head:  Normocephalic, atraumatic.    Eye:  Extraocular movements are intact, normal conjunctiva.    Ears, nose, mouth and throat:  Oral mucosa moist.   Cardiovascular:  Regular rate and rhythm, Normal peripheral perfusion.    Respiratory:  Good aeration overall.  Decreased breath sounds at the bases..   Musculoskeletal:  No swelling, no deformity.    Neurological:  No focal neurological deficit observed, normal motor observed.    Psychiatric:  Cooperative, appropriate mood & affect.       Medical Decision Making   Rationale:  87 year old male presents here for worsening dyspnea over the past couple days.  Will get chest x-ray, lab work, swab for COVID and flu.  Overall appears well..   Electrocardiogram:  Interpretation the patient's EKG is paced rhythm..      Reexamination/ Reevaluation   Time: 02/24/2021 11:40:00 .   Notes: Patient's BNP found to be elevated.  He is got fluid on his lungs with regards to his chest x-ray.  He is saturating 93% on room air.  I believe this to be appropriate.  We will give him a dose of IV Lasix and will start taking the Lasix he  has leftover.  He will call his cardiologist in the next day or so for follow-up..      Impression and Plan   Diagnosis   Acute CHF (ICD10-CM I50.9, Discharge, Medical)   Plan   Condition: Stable.    Disposition: Discharged: Time  02/24/2021 11:41:00, to home.    Patient was given the following educational materials: Pulmonary Edema.    Follow [facility] with: [facility] Within 1 to 2 days.    Counseled: Patient, Regarding diagnosis, Regarding diagnostic results, Regarding treatment plan, Patient indicated understanding of instructions.      Signature Line     Electronically Signed on 02/24/2021 11:41 AM EDT   ________________________________________________   Duwaine Maxin THOMAS-MD               Modified by: Duwaine Maxin THOMAS-MD on 02/24/2021 11:41 AM EDT

## 2021-02-24 NOTE — ED Notes (Signed)
ED Triage Note       ED Triage Adult Entered On:  02/24/2021 10:31 EDT    Performed On:  02/24/2021 10:29 EDT by Phylliss Bob               Triage   Numeric Rating Pain Scale :   4   ED Pain Details :   Pain Details   Chief Complaint :   woke this morning with SOB, states started yesterday. has been coughing for few days. mild discomfort/dull/tightness in chest.    [place] Mode of Arrival :   Wheelchair   Infectious Disease Documentation :   Document assessment   Temperature Oral :   36.7 degC(Converted to: 98.1 degF)    Heart Rate Monitored :   74 bpm   Respiratory Rate :   27 br/min (HI)    Systolic Blood Pressure :   184 mmHg (>HHI)    Diastolic Blood Pressure :   90 mmHg   SpO2 :   92 %   Oxygen Therapy :   Room air   Patient presentation :   None of the above   Chief Complaint or Presentation suggest infection :   Yes   Weight Dosing :   72 kg(Converted to: 158 lb 12 oz)    Height :   180 cm(Converted to: 5 ft 11 in)    Body Mass Index Dosing :   22 kg/m2   Phylliss Bob - 02/24/2021 10:29 EDT   DCP GENERIC CODE   Tracking Acuity :   2   Tracking Group :   ED [facility] Tracking Group   Phylliss Bob - 02/24/2021 10:29 EDT   ED General Section :   Document assessment   Pregnancy Status :   N/A   ED Allergies Section :   Document assessment   ED Reason for Visit Section :   Document assessment   Phylliss Bob - 02/24/2021 10:29 EDT   ID Risk Screen Symptoms   Close Contact with COVID-19 ID :   Yes   Last 14 days COVID-19 ID :   No   Phylliss Bob - 02/24/2021 10:29 EDT   ID COVID-19 Screen   Fever OR Chills :   No   Headache :   No   New or Worsening Cough :   Yes   Fatigue :   No   Shortness of Breath ID :   Yes   Myalgia (Muscle Pain) :   No   Dyspnea :   Yes   Diarrhea :   No   Sore Throat :   No   Nausea :   No   Sudden Loss of Taste or Smell :   No   Vomiting :   No   Congestion or Runny Nose ID :   Yes   Phylliss Bob - 02/24/2021 10:29 EDT   Allergies   (As Of: 02/24/2021 10:31:29 EDT)    Allergies (Active)   Augmentin  Estimated Onset Date:   Unspecified ; Reactions:   throat swelling ; Created By:   Phylliss Bob; Reaction Status:   Active ; Category:   Drug ; Substance:   Augmentin ; Type:   Allergy ; Severity:   Severe ; Updated By:   Phylliss Bob; Reviewed Date:   02/24/2021 10:30 EDT        Pain Assessment   Pain Location :   Chest  Quality :   Netta Corrigan - 02/24/2021 10:29 EDT   Image 4 -  Images currently included in the form version of this document have not been included in the text rendition version of the form.   Psycho-Social   Last 3 mo, thoughts killing self/others :   Patient denies   Right click within box for Suspected Abuse policy link. :   None   Feels Safe Where Live :   Yes   ED Behavioral Activity Rating Scale :   4 - Quiet and awake (normal level of activity)   Phylliss Bob - 02/24/2021 10:29 EDT   ED Reason for Visit   (As Of: 02/24/2021 10:31:29 EDT)   Diagnoses(Active)    Shortness of breath  Date:   02/24/2021 ; Diagnosis Type:   Reason For Visit ; Confirmation:   Complaint of ; Clinical Dx:   Shortness of breath ; Classification:   Medical ; Clinical Service:   Emergency medicine ; Code:   PNED ; Probability:   0 ; Diagnosis Code:   [id number]

## 2021-02-24 NOTE — ED Notes (Signed)
 ED Patient Summary       ;       [facility] Emergency Department  [address] [place], [place] [address]  [phone]  Discharge Instructions (Patient)  Nicholas Molina, Nicholas Molina  DOB:  04-06-1929                   MRN: [id number]                   FIN: [id number]  Reason For Visit: Shortness of breath; SOB/LOW OXY LEVEL/CHEST DISCOMFORT  Final Diagnosis: Acute CHF     Visit Date: 02/24/2021 10:28:00  Address: [address] [place] [address]  Phone: [phone]     Emergency Department Providers:         Primary Physician:      DORCUS ELSIE NED      [facility] would like to thank you for allowing us  to assist you with your healthcare needs. The following includes patient education materials and information regarding your injury/illness.     Follow-up Instructions:  You were seen today on an emergency basis. Please contact your primary care doctor for a follow up appointment. If you received a referral to a specialist doctor, it is important you follow-up as instructed.    It is important that you call your follow-up doctor to schedule and confirm the location of your next appointment. Your doctor may practice at multiple locations. The office location of your follow-up appointment may be different to the one written on your discharge instructions.    If you do not have a primary care doctor, please call [phone] for help in finding a [facility] Provider. For help in finding a specialist doctor, please call [phone].    If your condition gets worse before your follow-up with your primary care doctor or specialist, please return to the Emergency Department.      Coronavirus 2019 (COVID-19) Reminders:     Patients age 73 - 65, with parental consent, and patients over age 80 can make an appointment for a COVID-19 vaccine. Patients can contact their [facility] doctors' offices to schedule an appointment to receive the  COVID-19 vaccine. Patients who do not have a [facility] physician can call [phone] to schedule vaccination appointments.      Follow Up Appointments:  Primary Care Provider:     Name: PCP,  NONE     Phone:                  With: Address: When:   [facility] Cardiology Call for appt & office location   [phone] Business (1) Within 1 to 2 days              [facility] SERVICES%>          Medications that have not changed  Other Medications  allopurinol (allopurinol 100 mg oral tablet) 1 Tabs Oral (given by mouth) every day.  Last Dose:____________________  amiodarone (amiodarone 200 mg oral tablet) 1 Tabs Oral (given by mouth) every day.  Last Dose:____________________  clopidogrel (clopidogrel 75 mg oral tablet) 1 Tabs Oral (given by mouth) every day., IF PATIENT ON OMEPRAZOLE, PANTOPRAZOLE  SHOULD BE AUTOSUBSTITUTED DUE TO A  DRUG INTERACTION WITH CLOPIDOGREL.@@   Last Dose:____________________  escitalopram (escitalopram 10 mg oral tablet) 1 Tabs Oral (given by mouth) every day.  Last Dose:____________________  furosemide (furosemide 40  mg oral tablet) 1 Tabs Oral (given by mouth) every day.  Last Dose:____________________  isosorbide mononitrate (isosorbide mononitrate 30 mg oral tablet, extended release) 1 Tabs Oral (given by mouth) once a day (in the morning).  Last Dose:____________________  lansoprazole 30 Milligram Oral (given by mouth) every day.  Last Dose:____________________  levothyroxine (levothyroxine 88 mcg (0.088 mg) oral capsule) 1 Capsules Oral (given by mouth) every day.  Last Dose:____________________  rivaroxaban (Xarelto 20 mg oral tablet) 1 Tabs Oral (given by mouth) once a day (in the evening).  Last Dose:____________________  rosuvastatin (rosuvastatin 40 mg oral capsule) Oral (given by mouth) every day.  Last Dose:____________________  sacubitril-valsartan (Entresto 24 mg-26 mg oral tablet) Oral (given by mouth) 2 times a day.  Last  Dose:____________________      Allergy Info: Augmentin     >Discharge Additional Information          Discharge Patient 02/24/21 11:39:00 EDT      Patient Education Materials:        Pulmonary Edema    Pulmonary edema is a condition in which fluid collects in the air sacs of the lung. This makes it hard for the lungs to fill with air. It also prevents the lungs from moving oxygen into the bloodstream, which can affect other organs, such as the brain and kidneys. Pulmonary edema is an emergency and should be treated immediately.    There are two main types of pulmonary edema:   Cardiogenic. This means the pulmonary edema was caused by a problem with the heart.     Noncardiogenic. This means the pulmonary edema was caused by something other than the heart, such as an injury to the lung.        What are the causes?    This condition is commonly caused by heart failure. When this happens, the heart is not able to properly pump blood through the body. This can lead to increased pressure in the heart and blood building up in the veins around the lungs. When blood builds up in these veins, fluid gets pushed into the air sacs of the lung. Heart failure may be caused by:   Coronary artery disease.     High blood pressure.     Viral infection of the heart (myocarditis).     Leaky or stiff heart valves.     Irregular heartbeat (arrhythmia).      Fluid buildup caused by kidney problems.      Other causes include:   Infection in the lungs (pneumonia), blood (sepsis), or other part of the body.     Severe injury to the chest.     Lung injury from heat or toxins, such as breathing in smoke or poisonous gas.     Inhaling vomit or water (pulmonaryaspiration).     Certain medicines.     High altitude.        What are the signs or symptoms?    Symptoms of this condition include:   Shortness of breath.     Coughing with frothy or bloody mucus.     Wheezing.     Feeling like you cannot get enough air.     Shallow and fast breathing.      Skin that is cool and damp, and has a pale or bluish color.        How is this diagnosed?    This condition is diagnosed based on:   Your medical history.     A physical exam.  Your symptoms.      You may also have other tests, including:   Chest X-ray.     Chest CT scan.     Blood tests, including checking the amount of oxygen in the blood.     Sputum culture. This test checks for infection in the mucus that you cough up from your lungs.     Electrocardiogram. This measures the electrical signals of the heart.     Echocardiogram. This uses an ultrasound to evaluate the health of the heart.        How is this treated?    Initial treatment for this condition focuses on relieving your symptoms. Treatment depends on the underlying cause of the condition. This may include:   Oxygen therapy. The oxygen may be given through tubes in your nose or through a face mask. In severe cases, a breathing tube is inserted into the windpipe and hooked up to a breathing machine (ventilator).      Medicines. These may include medicines to:  ? Help the body get rid of extra water (diuretics).     ? Help the heart pump blood properly.    ? Prevent or destroy blood clots.        If poor heart function is the cause, treatment may also include:   Procedures to open blocked arteries, repair damaged heart valves, or remove some of the damaged heart muscle.     A pacemaker to help with heart function.     A procedure that uses electric shocks to regulate heart rate (cardioversion).      If an infection is the cause, treatment may include antibiotic medicines.      Follow these instructions at home:    Medicines     Take over-the-counter and prescription medicines only as told by your health care provider.     If you were prescribed an antibiotic, take it as told by your health care provider. Do not stop taking the antibiotic even if you start to feel better.     Have a plan with information about each medicine you take. This should  include:  ? Why you take the medicine.    ? Possible side effects.    ? Best time of day to take it.    ? Foods to take with it, or foods to avoid when taking it.    ? When to call your health care provider.       Make a list of each medicine, vitamin, or herbal supplement you take. Keep the list with you at all times. Show it to your health care provider at each visit and before starting a new medicine. Update the list as you add or stop medicines.      Lifestyle       Exercise regularly as told by your health care provider. It is important to do it safely. You can do this by:  ? Pacing your activities to avoid shortness of breath or chest pain.    ? Resting for at least 1 hour before and after meals.    ? Asking about cardiac rehabilitation programs. These may include education, exercise plans, and counseling.       Eat a heart-healthy diet that is low in salt (sodium), saturated fat, and cholesterol. Your health care provider may recommend foods that are high in fiber, such as fresh fruits and vegetables, whole grains, and beans.     Do not use any products that contain nicotine  or tobacco, such as cigarettes and e-cigarettes. If you need help quitting, ask your health care provider.      General instructions     Maintain a healthy weight.     Keep a record of your weight:  ? Record your hospital or clinic weight. When you get home, compare it to your scale and record your weight.    ? Weigh yourself first thing each morning after you urinate and before you eat breakfast. Wear the same amount of clothing each time. Record the weights.    ? Share your weight record with your health care provider. Daily weights are important in detecting the body's retention of excess fluid.    ? Tell your health care provider right away if you gain weight quickly. Your medicines may need to be adjusted.       Check and record your blood pressure as often as told by your health care provider. Bring the records with you to clinic  visits.     Consider therapy or joining a support group. This may help with any stress, fear, or anxiety.     Keep all follow-up visits as told by your health care provider. This is important.        Get help right away if:     You gain weight quickly.     You have severe chest pain, especially if the pain is crushing or pressure-like and spreads to the arms, back, neck, or jaw.     You have more swelling in your hands, feet, ankles, or abdomen.     You have nausea.     You have unusual sweating or your skin turns blue or pale.     Your shortness of breath gets worse.     You have dizziness, blurred vision, a headache, or unsteadiness.     Your blood pressure is higher than 180/120.     You cough up bloody mucus (sputum).     You cannot sleep because it is hard to breathe.     You feel a racing heart beat (palpitations).     You have anxiety or a feeling that you cannot get enough air.    These symptoms may represent a serious problem that is an emergency. Do not wait to see if the symptoms will go away. Get medical help right away. Call your local emergency services (911 in the [place] Do not drive yourself to the hospital.      Summary     Pulmonary edema is a condition in which fluid collects in the air sacs of your lungs. If left untreated, it can lead to a medical emergency.     This condition is most commonly caused by heart failure. Other causes can include infections or injury to the lungs.     Take over-the-counter and prescription medicines only as told by your health care provider.      This information is not intended to replace advice given to you by your health care provider. Make sure you discuss any questions you have with your health care provider.      Document Revised: 03/27/2017 Document Reviewed: 06/25/2016  Elsevier Patient Education ? 2021 Elsevier Inc.      ---------------------------------------------------------------------------------------------------------------------  [facility]  allows patients to review your COVID and other test results as well as discharge documents from any [facility], Emergency Department, surgical center or outpatient lab. Test results are typically available 36 hours after the test is  completed.     [facility] encourages you to self-enroll in the [facility] Patient Portal.     To begin your self-enrollment process, please visit [email or web address] Under [facility], click on "Sign up now".     NOTE: You must be 16 years and older to use [facility] Self-Enroll online. If you are a parent, caregiver, or guardian; you need an invite to access your child's or dependent's health records. To obtain an invite, contact the Medical Records department at [phone] Monday through Friday, 8-4:30, select option 3 . If we receive your call afterhours, we will return your call the next business day.     If you have issues trying to create or access your account, contact [employer] support at [phone] available 7 days a week 24 hours a day.     Comment:

## 2021-02-24 NOTE — Discharge Summary (Signed)
 ED Clinical Summary                     [facility]  [address] [place], [place], [address]  [phone]          PERSON INFORMATION  Name: Nicholas Molina Age:  87 Years DOB: November 15, 1928   Sex: Male Language: English PCP: PCP,  NONE   Marital Status: Married Phone: [phone] Med Service: MED-Medicine   MRN: [id number] Acct# [id number] Arrival: 02/24/2021 10:28:00   Visit Reason: Shortness of breath; SOB/LOW OXY LEVEL/CHEST DISCOMFORT Acuity: 2 LOS: 000 01:26   Address:    [address] [place] [address]   Diagnosis:    Acute CHF  Medications:          Medications that have not changed  Other Medications  allopurinol (allopurinol 100 mg oral tablet) 1 Tabs Oral (given by mouth) every day.  Last Dose:____________________  amiodarone (amiodarone 200 mg oral tablet) 1 Tabs Oral (given by mouth) every day.  Last Dose:____________________  clopidogrel (clopidogrel 75 mg oral tablet) 1 Tabs Oral (given by mouth) every day., IF PATIENT ON OMEPRAZOLE, PANTOPRAZOLE  SHOULD BE AUTOSUBSTITUTED DUE TO A  DRUG INTERACTION WITH CLOPIDOGREL.@@   Last Dose:____________________  escitalopram (escitalopram 10 mg oral tablet) 1 Tabs Oral (given by mouth) every day.  Last Dose:____________________  furosemide (furosemide 40 mg oral tablet) 1 Tabs Oral (given by mouth) every day.  Last Dose:____________________  isosorbide mononitrate (isosorbide mononitrate 30 mg oral tablet, extended release) 1 Tabs Oral (given by mouth) once a day (in the morning).  Last Dose:____________________  lansoprazole 30 Milligram Oral (given by mouth) every day.  Last Dose:____________________  levothyroxine (levothyroxine 88 mcg (0.088 mg) oral capsule) 1 Capsules Oral (given by mouth) every day.  Last Dose:____________________  rivaroxaban (Xarelto 20 mg oral tablet) 1 Tabs Oral (given by mouth) once a day (in the evening).  Last Dose:____________________  rosuvastatin (rosuvastatin 40 mg oral  capsule) Oral (given by mouth) every day.  Last Dose:____________________  sacubitril-valsartan (Entresto 24 mg-26 mg oral tablet) Oral (given by mouth) 2 times a day.  Last Dose:____________________      Medications Administered During Visit:                Medication Dose Route   furosemide 80 mg IV Push               Allergies      Augmentin (throat swelling)      Major Tests and Procedures:  The following procedures and tests were performed during your ED visit.  COMMON PROCEDURES%>  COMMON PROCEDURES COMMENTS%>                PROVIDER INFORMATION               Provider Role Assigned Sampson DORCUS FALLOW [facility] ED Provider 02/24/2021 10:29:53    Wray Brunet J-RN ED Nurse 02/24/2021 10:35:34        Attending Physician:  DORCUS FALLOW THOMAS-MD      Admit Doc  DORCUS FALLOW NEVINS     Consulting Doc       VITALS INFORMATION  Vital Sign Triage Latest   Temp Oral ORAL_1%> ORAL%>   Temp Temporal TEMPORAL_1%> TEMPORAL%>   Temp Intravascular INTRAVASCULAR_1%> INTRAVASCULAR%>   Temp Axillary AXILLARY_1%> AXILLARY%>   Temp Rectal RECTAL_1%> RECTAL%>   02 Sat 92 % 93 %   Respiratory  Rate RATE_1%> RATE%>   Peripheral Pulse Rate PULSE RATE_1%>68 bpm PULSE RATE%>   Apical Heart Rate HEART RATE_1%> HEART RATE%>   Blood Pressure BLOOD PRESSURE_1%>/ BLOOD PRESSURE_1%>90 mmHg BLOOD PRESSURE%> / BLOOD PRESSURE%>71 mmHg                 Immunizations      No Immunizations Documented This Visit          DISCHARGE INFORMATION   Discharge Disposition: H Outpt-Sent Home   Discharge Location:  Home   Discharge Date and Time:  02/24/2021 11:54:41   ED Checkout Date and Time:  02/24/2021 11:54:41     DEPART REASON INCOMPLETE INFORMATION               Depart Action Incomplete Reason   Interactive View/I&O Recently assessed               Problems      No Problems Documented              Smoking Status      No Smoking Status Documented         PATIENT EDUCATION INFORMATION  Instructions:     Pulmonary Edema     Follow  up:                   With: Address: When:   [facility] Cardiology Call for appt & office location   [phone] Business (1) Within 1 to 2 days              ED PROVIDER DOCUMENTATION     Patient:   Nicholas Molina, Nicholas Molina            MRN: [id number]            FIN: [phone]               Age:   70 years     Sex:  Male     DOB:  1928/12/14   Associated Diagnoses:   Acute CHF   Author:   DORCUS FALLOW THOMAS-MD      Basic Information   Time seen: Provider Seen (ST)   ED Provider/Time:    DORCUS FALLOW THOMAS-MD / 02/24/2021 10:29  .   Additional information: Chief Complaint from Nursing Triage Note   Chief Complaint  Chief Complaint: woke this morning with SOB, states started yesterday. has been coughing for few days. mild discomfort/dull/tightness in chest. (02/24/21 10:29:00).      History of Present Illness   The patient presents with 87 year old male presents here for evaluation of cough and shortness of breath.  Patient states he started coughing about 2 days ago.  He states he is coughing up some mucus but does not sound like any sputum.  He does feel short of breath.  He denies having any chest pain.  He denies fevers, but endorses chills.  He has no back pain.  No abdominal pain, nausea, vomiting.  He has no myalgias.  He tells me that a few weeks ago he was taken off of Lasix and is concerned that he might be fluid overloaded..        Review of Systems   Constitutional symptoms:  Chills, no fever, no generalized weakness.    Skin symptoms:  No rash, no lesion.    Eye symptoms:  Vision unchanged, No blurred vision,    ENMT symptoms:  No sore throat, no sinus pain.    Respiratory symptoms:  Shortness of  breath, cough.    Cardiovascular symptoms:  No chest pain, no palpitations, no syncope, no diaphoresis, no peripheral edema.    Gastrointestinal symptoms:  No abdominal pain, no nausea, no vomiting.    Musculoskeletal symptoms:  No back pain, no Muscle pain.    Neurologic symptoms:  No headache, no dizziness.     Psychiatric symptoms:  No anxiety, no depression.       Health Status   Allergies:    Allergic Reactions (Selected)  Severe  Augmentin- Throat swelling..      Past Medical/ Family/ Social History   Surgical history: Reviewed as documented in chart.   Family history: Reviewed as documented in chart.   Social history: Reviewed as documented in chart.   Problem list:    No qualifying data available  .      Physical Examination               Vital Signs   Vital Signs   02/24/2021 10:29 EDT Systolic Blood Pressure 184 mmHg  >HHI    Diastolic Blood Pressure 90 mmHg    Temperature Oral 36.7 degC    Heart Rate Monitored 74 bpm    Respiratory Rate 27 br/min  HI    SpO2 92 %   .   Measurements   02/24/2021 10:31 EDT Body Mass Index est meas 22.22 kg/m2    Body Mass Index Measured 22.22 kg/m2   02/24/2021 10:29 EDT Height/Length Measured 180 cm    Weight Dosing 72 kg   .   Basic Oxygen Information   02/24/2021 10:29 EDT Oxygen Therapy Room air    SpO2 92 %   .   General:  Alert, no acute distress.    Skin:  Warm, dry, intact.    Head:  Normocephalic, atraumatic.    Eye:  Extraocular movements are intact, normal conjunctiva.    Ears, nose, mouth and throat:  Oral mucosa moist.   Cardiovascular:  Regular rate and rhythm, Normal peripheral perfusion.    Respiratory:  Good aeration overall.  Decreased breath sounds at the bases..   Musculoskeletal:  No swelling, no deformity.    Neurological:  No focal neurological deficit observed, normal motor observed.    Psychiatric:  Cooperative, appropriate mood & affect.       Medical Decision Making   Rationale:  87 year old male presents here for worsening dyspnea over the past couple days.  Will get chest x-ray, lab work, swab for COVID and flu.  Overall appears well..   Electrocardiogram:  Interpretation the patient's EKG is paced rhythm..      Reexamination/ Reevaluation   Time: 02/24/2021 11:40:00 .   Notes: Patient's BNP found to be elevated.  He is got fluid on his lungs with  regards to his chest x-ray.  He is saturating 93% on room air.  I believe this to be appropriate.  We will give him a dose of IV Lasix and will start taking the Lasix he has leftover.  He will call his cardiologist in the next day or so for follow-up..      Impression and Plan   Diagnosis   Acute CHF (ICD10-CM I50.9, Discharge, Medical)   Plan   Condition: Stable.    Disposition: Discharged: Time  02/24/2021 11:41:00, to home.    Patient was given the following educational materials: Pulmonary Edema.    [facility] with[facility] Within 1 to 2 days.    Counseled: Patient, Regarding diagnosis, Regarding diagnostic results, Regarding treatment plan,  Patient indicated understanding of instructions.

## 2021-02-24 NOTE — ED Notes (Signed)
ED Patient Education Note     Patient Education Materials Follows:  Pulmonary Medicine     Pulmonary Edema    Pulmonary edema is a condition in which fluid collects in the air sacs of the lung. This makes it hard for the lungs to fill with air. It also prevents the lungs from moving oxygen into the bloodstream, which can affect other organs, such as the brain and kidneys. Pulmonary edema is an emergency and should be treated immediately.    There are two main types of pulmonary edema:   Cardiogenic. This means the pulmonary edema was caused by a problem with the heart.     Noncardiogenic. This means the pulmonary edema was caused by something other than the heart, such as an injury to the lung.        What are the causes?    This condition is commonly caused by heart failure. When this happens, the heart is not able to properly pump blood through the body. This can lead to increased pressure in the heart and blood building up in the veins around the lungs. When blood builds up in these veins, fluid gets pushed into the air sacs of the lung. Heart failure may be caused by:   Coronary artery disease.     High blood pressure.     Viral infection of the heart (myocarditis).     Leaky or stiff heart valves.     Irregular heartbeat (arrhythmia).      Fluid buildup caused by kidney problems.      Other causes include:   Infection in the lungs (pneumonia), blood (sepsis), or other part of the body.     Severe injury to the chest.     Lung injury from heat or toxins, such as breathing in smoke or poisonous gas.     Inhaling vomit or water (pulmonaryaspiration).     Certain medicines.     High altitude.        What are the signs or symptoms?    Symptoms of this condition include:   Shortness of breath.     Coughing with frothy or bloody mucus.     Wheezing.     Feeling like you cannot get enough air.     Shallow and fast breathing.     Skin that is cool and damp, and has a pale or bluish color.        How is this  diagnosed?    This condition is diagnosed based on:   Your medical history.     A physical exam.     Your symptoms.      You may also have other tests, including:   Chest X-ray.     Chest CT scan.     Blood tests, including checking the amount of oxygen in the blood.     Sputum culture. This test checks for infection in the mucus that you cough up from your lungs.     Electrocardiogram. This measures the electrical signals of the heart.     Echocardiogram. This uses an ultrasound to evaluate the health of the heart.        How is this treated?    Initial treatment for this condition focuses on relieving your symptoms. Treatment depends on the underlying cause of the condition. This may include:   Oxygen therapy. The oxygen may be given through tubes in your nose or through a face mask. In severe cases, a breathing tube is  inserted into the windpipe and hooked up to a breathing machine (ventilator).      Medicines. These may include medicines to:  ? Help the body get rid of extra water (diuretics).     ? Help the heart pump blood properly.    ? Prevent or destroy blood clots.        If poor heart function is the cause, treatment may also include:   Procedures to open blocked arteries, repair damaged heart valves, or remove some of the damaged heart muscle.     A pacemaker to help with heart function.     A procedure that uses electric shocks to regulate heart rate (cardioversion).      If an infection is the cause, treatment may include antibiotic medicines.      Follow these instructions at home:    Medicines     Take over-the-counter and prescription medicines only as told by your health care provider.     If you were prescribed an antibiotic, take it as told by your health care provider. Do not stop taking the antibiotic even if you start to feel better.     Have a plan with information about each medicine you take. This should include:  ? Why you take the medicine.    ? Possible side effects.    ? Best time of  day to take it.    ? Foods to take with it, or foods to avoid when taking it.    ? When to call your health care provider.       Make a list of each medicine, vitamin, or herbal supplement you take. Keep the list with you at all times. Show it to your health care provider at each visit and before starting a new medicine. Update the list as you add or stop medicines.      Lifestyle       Exercise regularly as told by your health care provider. It is important to do it safely. You can do this by:  ? Pacing your activities to avoid shortness of breath or chest pain.    ? Resting for at least 1 hour before and after meals.    ? Asking about cardiac rehabilitation programs. These may include education, exercise plans, and counseling.       Eat a heart-healthy diet that is low in salt (sodium), saturated fat, and cholesterol. Your health care provider may recommend foods that are high in fiber, such as fresh fruits and vegetables, whole grains, and beans.     Do not use any products that contain nicotine or tobacco, such as cigarettes and e-cigarettes. If you need help quitting, ask your health care provider.      General instructions     Maintain a healthy weight.     Keep a record of your weight:  ? Record your hospital or clinic weight. When you get home, compare it to your scale and record your weight.    ? Weigh yourself first thing each morning after you urinate and before you eat breakfast. Wear the same amount of clothing each time. Record the weights.    ? Share your weight record with your health care provider. Daily weights are important in detecting the body's retention of excess fluid.    ? Tell your health care provider right away if you gain weight quickly. Your medicines may need to be adjusted.       Check and record your blood pressure  as often as told by your health care provider. Bring the records with you to clinic visits.     Consider therapy or joining a support group. This may help with any stress,  fear, or anxiety.     Keep all follow-up visits as told by your health care provider. This is important.        Get help right away if:     You gain weight quickly.     You have severe chest pain, especially if the pain is crushing or pressure-like and spreads to the arms, back, neck, or jaw.     You have more swelling in your hands, feet, ankles, or abdomen.     You have nausea.     You have unusual sweating or your skin turns blue or pale.     Your shortness of breath gets worse.     You have dizziness, blurred vision, a headache, or unsteadiness.     Your blood pressure is higher than 180/120.     You cough up bloody mucus (sputum).     You cannot sleep because it is hard to breathe.     You feel a racing heart beat (palpitations).     You have anxiety or a feeling that you cannot get enough air.    These symptoms may represent a serious problem that is an emergency. Do not wait to see if the symptoms will go away. Get medical help right away. Call your local emergency services (911 in the [place] Do not drive yourself to the hospital.      Summary     Pulmonary edema is a condition in which fluid collects in the air sacs of your lungs. If left untreated, it can lead to a medical emergency.     This condition is most commonly caused by heart failure. Other causes can include infections or injury to the lungs.     Take over-the-counter and prescription medicines only as told by your health care provider.      This information is not intended to replace advice given to you by your health care provider. Make sure you discuss any questions you have with your health care provider.      Document Revised: 03/27/2017 Document Reviewed: 06/25/2016  Elsevier Patient Education ? 2021 [employer]

## 2021-02-24 NOTE — ED Notes (Signed)
ED Triage Note       ED Secondary Triage Entered On:  02/24/2021 10:38 EDT    Performed On:  02/24/2021 10:38 EDT by Gaylyn Rong J-RN               General Information   ED Home Meds Section :   Document assessment   [facility] ED Fall Risk Section :   Document assessment   ED Advance Directives Section :   Document assessment   ED Palliative Screen :   Document assessment   Gaylyn Rong J-RN - 02/24/2021 10:54 EDT   Barriers to Learning :   None evident   Gaylyn Rong J-RN - 02/24/2021 10:38 EDT   (As Of: 02/24/2021 10:55:07 EDT)   Diagnoses(Active)    Shortness of breath  Date:   02/24/2021 ; Diagnosis Type:   Reason For Visit ; Confirmation:   Complaint of ; Clinical Dx:   Shortness of breath ; Classification:   Medical ; Clinical Service:   Emergency medicine ; Code:   PNED ; Probability:   0 ; Diagnosis Code:   [id number]             -    Procedure History   (As Of: 02/24/2021 10:55:07 EDT)     [facility] Fall Risk Assessment Tool   Hx of falling last 3 months ED Fall :   Yes (Single mechanical fall)   Patient confused or disoriented ED Fall :   No   Patient intoxicated or sedated ED Fall :   No   Patient impaired gait ED Fall :   Yes   Use a mobility assistance device ED Fall :   No   Patient altered elimination ED Fall :   No   [facility] ED Fall Score :   2    Gaylyn Rong J-RN - 02/24/2021 10:54 EDT   ED Advance Directive   Advance Directive :   Yes   Gaylyn Rong J-RN - 02/24/2021 10:54 EDT   Palliative Care   Does the Patient have a Life Limiting Illness :   None of the above   Gaylyn Rong J-RN - 02/24/2021 10:54 EDT   Med Hx   Medication List   (As Of: 02/24/2021 10:55:07 EDT)

## 2021-02-25 ENCOUNTER — Telehealth: Payer: Self-pay | Admitting: Cardiology

## 2021-02-25 NOTE — Telephone Encounter (Signed)
New Message;    Patient he had to go to the ER on 02-24-21 in [place] ,[place]. He said he was having problems with breathing. He said they gave him a massive dose of Furosemide He wants to know if Dr Bettina Gavia wants him to mail him the ER visit and report? He wants to know if he needs to back on his normal dose of 40 g of Furosemide?

## 2021-02-25 NOTE — Telephone Encounter (Signed)
Left message for patient to return call.

## 2021-02-26 NOTE — Progress Notes (Signed)
Remote pacemaker transmission.   

## 2021-02-26 NOTE — Telephone Encounter (Signed)
Left message on patients voicemail to please return our call.   

## 2021-02-26 NOTE — Telephone Encounter (Signed)
Spoke to the patient just now and he let me know that he received a shot of the furosemide while he was in the ED on 02/24/21. They did not tell him whether or not to stop his tablet of furosemide and I advised him to continue taking this for now. He is going to mail the records to our office that he has from the ED visit.

## 2021-02-28 ENCOUNTER — Telehealth: Payer: Self-pay | Admitting: Cardiology

## 2021-02-28 DIAGNOSIS — Z961 Presence of intraocular lens: Secondary | ICD-10-CM | POA: Diagnosis not present

## 2021-02-28 DIAGNOSIS — E119 Type 2 diabetes mellitus without complications: Secondary | ICD-10-CM | POA: Diagnosis not present

## 2021-02-28 DIAGNOSIS — H524 Presbyopia: Secondary | ICD-10-CM | POA: Diagnosis not present

## 2021-02-28 DIAGNOSIS — H40013 Open angle with borderline findings, low risk, bilateral: Secondary | ICD-10-CM | POA: Diagnosis not present

## 2021-02-28 DIAGNOSIS — H35373 Puckering of macula, bilateral: Secondary | ICD-10-CM | POA: Diagnosis not present

## 2021-02-28 NOTE — Telephone Encounter (Signed)
Pt c/o Shortness Of Breath: STAT if SOB developed within the last 24 hours or pt is noticeably SOB on the phone  1. Are you currently SOB (can you hear that pt is SOB on the phone)? Yes   2. How long have you been experiencing SOB? Since last saturday  3. Are you SOB when sitting or when up moving around? Both   4. Are you currently experiencing any other symptoms? Chest tightness

## 2021-02-28 NOTE — Telephone Encounter (Signed)
Spoke to patient. He reports short ness of breath since Saturday. Sunday he went to the hospital and was given IV lasix. Since then his shortness of breath is still present however no swelling in lower extremities, no weight gain that he is aware of as he has not been checking it. He has been taking lasix 40 mg daily since Monday. He is short of breath on exertion and at rest.Also experiencing chest tightness on left side of chest.Not pain only tightness.

## 2021-02-28 NOTE — Telephone Encounter (Signed)
Left message for patient to return call.

## 2021-02-28 NOTE — Telephone Encounter (Signed)
Spoke to patient informed him to increase lasix to 40 mg twice daily and scheduled him for soonest appointment in [place]. He understood.

## 2021-03-05 ENCOUNTER — Ambulatory Visit: Payer: Medicare HMO | Admitting: Cardiology

## 2021-03-05 ENCOUNTER — Encounter: Payer: Self-pay | Admitting: Cardiology

## 2021-03-05 ENCOUNTER — Other Ambulatory Visit: Payer: Self-pay

## 2021-03-05 VITALS — BP 114/62 | HR 61 | Ht 70.5 in | Wt 152.2 lb

## 2021-03-05 DIAGNOSIS — R001 Bradycardia, unspecified: Secondary | ICD-10-CM | POA: Diagnosis not present

## 2021-03-05 DIAGNOSIS — R55 Syncope and collapse: Secondary | ICD-10-CM | POA: Diagnosis not present

## 2021-03-05 DIAGNOSIS — I480 Paroxysmal atrial fibrillation: Secondary | ICD-10-CM

## 2021-03-05 DIAGNOSIS — I10 Essential (primary) hypertension: Secondary | ICD-10-CM | POA: Diagnosis not present

## 2021-03-05 DIAGNOSIS — Z79899 Other long term (current) drug therapy: Secondary | ICD-10-CM

## 2021-03-05 DIAGNOSIS — I5042 Chronic combined systolic (congestive) and diastolic (congestive) heart failure: Secondary | ICD-10-CM | POA: Diagnosis not present

## 2021-03-05 DIAGNOSIS — I38 Endocarditis, valve unspecified: Secondary | ICD-10-CM

## 2021-03-05 LAB — PACEMAKER DEVICE OBSERVATION

## 2021-03-05 MED ORDER — AMIODARONE HCL 200 MG PO TABS: 100.0000 mg | ORAL_TABLET | Freq: Every day | ORAL | 3 refills | Status: DC

## 2021-03-05 NOTE — Patient Instructions (Addendum)
Medication Instructions:   Your physician has recommended you make the following change in your medication:    TAKE your furosemide (Lasix) as follows: Take 40 mg by mouth as needed (if weight greater than 157 lb)  2.   REDUCE your amiodarone 200 mg-  Take 1/2 tablet (100 mg) by mouth once a day  Lab Work: You will get lab work today:  CMP, TSH and free T4 If you have labs (blood work) drawn today and your tests are completely normal, you will receive your results only by: [employer] (if you have MyChart) OR A paper copy in the mail If you have any lab test that is abnormal or we need to change your treatment, we will call you to review the results.  Testing/Procedures: None ordered.  Follow-Up: At [facility], you and your health needs are our priority.  As part of our continuing mission to provide you with exceptional heart care, we have created designated Provider Care Teams.  These Care Teams include your primary Cardiologist (physician) and Advanced Practice Providers (APPs -  Physician Assistants and Nurse Practitioners) who all work together to provide you with the care you need, when you need it.  Your next appointment:   Your physician wants you to follow-up in: 7-10 days with one of the following Advanced Practice Providers on your designated Care Team:   Tommye Standard, [place] Legrand Como "Jonni [place]" Chalmers Cater, [place]  Remote monitoring is used to monitor your Pacemaker from home. This monitoring reduces the number of office visits required to check your device to one time per year. It allows [place] to keep an eye on the functioning of your device to ensure it is working properly. You are scheduled for a device check from home on 05/21/2021. You may send your transmission at any time that day. If you have a wireless device, the transmission will be sent automatically. After your physician reviews your transmission, you will receive a postcard with your next transmission date.

## 2021-03-05 NOTE — Progress Notes (Signed)
Electrophysiology Office Follow up Visit Note:    Date:  03/05/2021   ID:  Ian Cummings, DOB [birth year], MRN [id number]  PCP:  Ginger Organ., MD  [facility] Cardiologist:  Shirlee More, MD  [facility] Electrophysiologist:  Vickie Epley, MD    Interval History:    Ian Cummings is a 87 y.o. male who presents for a follow up visit. The patient has a PPM implanted 05/21/2020 for syncope.  He was seen recently in the ER for dyspnea and was diagnosed with an acute decompensation of his systolic heart failure. He was started on lasix and has been taking it twice daily. This improved his dyspnea but now he is experiencing lightheadedness. Because of this symptom he has decided to decrease his lasix prescription to $RemoveBeforeD'40mg'tfmpOTCzQCGuYH$  in the morning and $RemoveBef'20mg'zFMBbHYgqJ$  in the PM. This change has improved his symptoms.       Past Medical History:  Diagnosis Date   AAA (abdominal aortic aneurysm)    2004, Pt stated not there now   Anginal pain ([place])    Anxiety    Arthritis    Atherosclerosis of native arteries of the extremities with intermittent claudication 12/04/2011   ABI .72 and .54 in 2012    Atherosclerotic PVD with intermittent claudication ([place]) 02/20/2014   BPH (benign prostatic hypertrophy)    Bradycardia 01/12/2019   Bradycardia on ECG 11/11/2017   CAD (coronary artery disease)    Carotid artery disease ([place])    Right carotid stent 2013   Carotid artery disease without cerebral infarction [facility])    S/p right carotid stent for asymptomatic disease  2013    Chronic diastolic (congestive) heart failure ([place]) 09/10/2019   Chronic kidney disease, stage 3a ([place]) 09/10/2019   Claudication ([place])    Colon polyps    adenomatous   Complex sleep apnea syndrome 11/11/2017   Conjunctival lesion 05/04/2014   Formatting of this note might be different from the original. left   Coronary artery disease    Coronary artery disease involving native coronary artery of native heart with unstable angina  pectoris ([place]) 09/06/2012   CABG w LIMA to LAD, SVG to OM1-OM2, SVG to dx, SVG to PD/PL 1995,  Cypher stent x 2 SVG to RCA and Cypher stent to Circ Graft 05/13/07, stent of SVG to circ OM in [place] in July 2013  Cath 4/14 [facility] normal Left main, occluded mid LAD, occluded RCA SVG, occluded CFX, widely patent OM 1 SVG, occluded RCA SVG, occluded Diag 1 SVG, widely patent LAD LIMA graft;  Redo CABG Dr. Servando Snare  10/18/12 with    Depression    Essential hypertension 01/12/2019   GERD (gastroesophageal reflux disease)    Gout    H/O hiatal hernia    Hyperlipidemia    Hypertension    Hypertension, renal disease, stage 1-4 or unspecified chronic kidney disease    Hypothyroidism    Malnutrition of moderate degree 08/05/2015   MI (myocardial infarction) ([place]) 4/14   Mixed hyperlipidemia    Neuropathy    Hx: of in B/L toes   OSA on CPAP 02/17/2018   PAD (peripheral artery disease) ([place]) 08/03/2015   PAF (paroxysmal atrial fibrillation) ([place])    Peripheral vascular disease ([place])    with claudication   Pre-diabetes    RBBB 01/12/2019   s/p CABG    Shoulder pain 01/27/2019   Stroke ([place])    TIA (transient ischemic attack)    Hx: of   Unstable angina (  [place]) 09/10/2019    Past Surgical History:  Procedure Laterality Date   CARDIOVERSION N/A 02/22/2020   Procedure: CARDIOVERSION;  Surgeon: [name], MD;  Location: [place];  Service: Cardiovascular;  Laterality: N/A;   CAROTID STENT INSERTION Right 2013   right at [facility]   CATARACT EXTRACTION Bilateral    Hx: of both eyes   COLONOSCOPY     Hx: of   COLONOSCOPY     CORONARY ANGIOPLASTY WITH STENT PLACEMENT  2010   CORONARY ARTERY BYPASS GRAFT  1995   CORONARY ARTERY BYPASS GRAFT N/A 10/18/2012   Procedure: REDO CORONARY ARTERY BYPASS GRAFTING (CABG);  Surgeon: Grace Isaac, MD;  Location: [place];  Service: Open Heart Surgery;  Laterality: N/A;  off pump times two using endoscopically harvested left saphenous vein    CORONARY STENT PLACEMENT  06/2016   ENDARTERECTOMY FEMORAL Right 08/03/2015   Procedure: RIGHT FEMORAL ENDARTERECTOMY WITH PATCH ANGIOPLASTY;  Surgeon: Serafina Mitchell, MD;  Location: [place];  Service: Vascular;  Laterality: Right;   FEMORAL-POPLITEAL BYPASS GRAFT Right 08/03/2015   Procedure: RIGHT FEMORAL-BELOW KNEE POPLITEAL ARTERY BYPASS GRAFT;  Surgeon: Serafina Mitchell, MD;  Location: [place];  Service: Vascular;  Laterality: Right;   FOOT SURGERY Left    FRACTURE SURGERY Left    Hx: of Left heel   INGUINAL HERNIA REPAIR Bilateral    X 2    INTRAOPERATIVE TRANSESOPHAGEAL ECHOCARDIOGRAM N/A 10/18/2012   Procedure: INTRAOPERATIVE TRANSESOPHAGEAL ECHOCARDIOGRAM;  Surgeon: Grace Isaac, MD;  Location: [place];  Service: Open Heart Surgery;  Laterality: N/A;   LEFT HEART CATH AND CORS/GRAFTS ANGIOGRAPHY N/A 10/25/2019   Procedure: LEFT HEART CATH AND CORS/GRAFTS ANGIOGRAPHY;  Surgeon: Leonie Man, MD;  Location: [place] CV LAB;  Service: Cardiovascular;  Laterality: N/A;   LEFT HEART CATHETERIZATION WITH CORONARY/GRAFT ANGIOGRAM N/A 02/02/2013   Procedure: LEFT HEART CATHETERIZATION WITH Beatrix Fetters;  Surgeon: Jacolyn Reedy, MD;  Location: [facility] CATH LAB;  Service: Cardiovascular;  Laterality: N/A;   LOWER EXTREMITY ANGIOGRAM Right 06/27/2015   Procedure: Lower Extremity Angiogram;  Surgeon: Serafina Mitchell, MD;  Location: [place] CV LAB;  Service: Cardiovascular;  Laterality: Right;   MASTOIDECTOMY  1933   PACEMAKER IMPLANT N/A 05/21/2020   Procedure: PACEMAKER IMPLANT;  Surgeon: Vickie Epley, MD;  Location: [place] CV LAB;  Service: Cardiovascular;  Laterality: N/A;   PERIPHERAL VASCULAR CATHETERIZATION N/A 01/23/2015   Procedure: Abdominal Aortogram;  Surgeon: Serafina Mitchell, MD;  Location: [place] CV LAB;  Service: Cardiovascular;  Laterality: N/A;   PERIPHERAL VASCULAR CATHETERIZATION N/A 06/27/2015   Procedure: Abdominal Aortogram;  Surgeon: Serafina Mitchell,  MD;  Location: [place] CV LAB;  Service: Cardiovascular;  Laterality: N/A;   RETINAL DETACHMENT SURGERY Left    Hx: of left eye   TEE WITHOUT CARDIOVERSION N/A 02/22/2020   Procedure: TRANSESOPHAGEAL ECHOCARDIOGRAM (TEE);  Surgeon: [name], MD;  Location: [facility] ENDOSCOPY;  Service: Cardiovascular;  Laterality: N/A;   TONSILLECTOMY      Current Medications: Current Meds  Medication Sig   allopurinol (ZYLOPRIM) 100 MG tablet Take 100 mg by mouth daily.    amiodarone (PACERONE) 200 MG tablet Take 0.5 tablets (100 mg total) by mouth daily.   clopidogrel (PLAVIX) 75 MG tablet Take 1 tablet (75 mg total) by mouth daily.   escitalopram (LEXAPRO) 10 MG tablet Take 10 mg by mouth daily.   furosemide (LASIX) 40 MG tablet Take 40 mg by mouth as needed (if  weight greater than 157 lb).   isosorbide mononitrate (IMDUR) 30 MG 24 hr tablet Take 30 mg by mouth daily.   lansoprazole (PREVACID) 30 MG capsule Take 30 mg by mouth daily.   levothyroxine (SYNTHROID) 100 MCG tablet Take 1 tablet (100 mcg total) by mouth daily before breakfast.   multivitamin (RENA-VIT) TABS tablet Take 1 tablet by mouth daily.   nitroGLYCERIN (NITROSTAT) 0.4 MG SL tablet Place 0.4 mg under the tongue every 5 (five) minutes as needed for chest pain.    rivaroxaban (XARELTO) 20 MG TABS tablet Take 1 tablet (20 mg total) by mouth daily with supper.   rosuvastatin (CRESTOR) 40 MG tablet Take 40 mg by mouth every other day.   sacubitril-valsartan (ENTRESTO) 24-26 MG Take 1 tablet by mouth 2 (two) times daily.   [DISCONTINUED] amiodarone (PACERONE) 200 MG tablet Take 200 mg by mouth daily.     Allergies:   Atorvastatin, Amoxicillin, and Metoprolol   Social History   Socioeconomic History   Marital status: Married    Spouse name: Not on file   Number of children: 3   Years of education: Not on file   Highest education level: Not on file  Occupational History   Occupation: retired  Tobacco Use   Smoking status:  Former    Types: Cigarettes    Quit date: 04/29/1975    Years since quitting: 45.8   Smokeless tobacco: Never  Vaping Use   Vaping Use: Never used  Substance and Sexual Activity   Alcohol use: Yes    Alcohol/week: 3.0 standard drinks    Types: 3 Glasses of wine per week    Comment: 3 glasses of wine today   Drug use: No   Sexual activity: Not Currently  Other Topics Concern   Not on file  Social History Narrative   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 There is no history of Colon cancer, Stomach cancer, Rectal cancer, Esophageal cancer, Liver disease, or Kidney disease.  ROS:   Please see the history of present illness.    All other systems reviewed and are negative.  EKGs/Labs/Other Studies Reviewed:    The following studies were reviewed today:  03/05/2021 device interrogation in clinic Battery longevity 7+ years Lead parameters stable Ap 90% Vp 4%   EKG:  The ekg ordered today demonstrates ApVs. PAC.  Recent Labs: 07/25/2020: NT-Pro BNP 775 02/06/2021: ALT 42; BUN 26; Creatinine, Ser 1.33; Hemoglobin 12.2; Platelets 195; Potassium 4.3; Sodium 138; TSH 23.400  Recent Lipid Panel    Component Value Date/Time   CHOL 121 01/23/2020 1040   TRIG 191 (H) 01/23/2020 1040   HDL 30 (L) 01/23/2020 1040   CHOLHDL 4.0 01/23/2020 1040   CHOLHDL 3.2 04/18/2009 0241   VLDL 23 04/18/2009 0241   LDLCALC 59 01/23/2020 1040    Physical Exam:    VS:  BP 114/62   Pulse 61   Ht 5' 10.5" (1.791 m)   Wt 152 lb 3.2 oz (69 kg)   SpO2  96%   BMI  21.53 kg/m     Wt Readings from Last 3 Encounters:  03/05/21 152 lb 3.2 oz (69 kg)  02/06/21 155 lb (70.3 kg)  01/28/21 155 lb (70.3 kg)     GEN: Well nourished, well developed in no acute distress HEENT: Normal NECK: No JVD; No carotid bruits LYMPHATICS: No lymphadenopathy CARDIAC: RRR, no murmurs, rubs, gallops. Ppm pocket well healed. RESPIRATORY:  Clear to auscultation without rales, wheezing or rhonchi  ABDOMEN: Soft, non-tender, non-distended MUSCULOSKELETAL:  No edema; No deformity  SKIN: Warm and dry NEUROLOGIC:  Alert and oriented x 3 PSYCHIATRIC:  Normal affect        ASSESSMENT:    1. Paroxysmal atrial fibrillation (HCC)   2. Bradycardia   3. On amiodarone therapy   4. Syncope and collapse   5. Primary hypertension   6. 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 Improved on lasix.  Rhythm control indicated.  Recommend stopping lasix and monitoring his weights daily. Today he is a few lbs too dry. Recommend targeting weight of 157lb.  Continue other HF meds. I will have him follow up with APP in 7-10 days for repeat exam and to assess his response to prn diuretics.  #PPM in situ, bradycardia PPM doing well. Continue remote monitoring.  #HTN Controlled.   #AF Decrease amio to 167m PO daily. Blood work today for aPepsiComonitoring  F/U 7-10 days with APP   Total time spent with patient today 55 minutes. This includes reviewing records, evaluating the patient and coordinating care.   Medication Adjustments/Labs and Tests Ordered: Current medicines are reviewed at length with the patient today.  Concerns regarding medicines are outlined above.  Orders Placed This Encounter  Procedures   Comp Met (CMET)   TSH   T4, free   EKG 12-Lead   Meds ordered this encounter  Medications    amiodarone (PACERONE) 200 MG tablet    Sig: Take 0.5 tablets (100 mg total) by mouth daily.    Dispense:  45 tablet    Refill:  3     Signed, CLars Mage MD, FCoastal [facility]11/11/2020 6:53 PM    Electrophysiology [facility]

## 2021-03-06 ENCOUNTER — Ambulatory Visit: Payer: Medicare HMO | Admitting: Cardiology

## 2021-03-06 LAB — SPECIMEN STATUS

## 2021-03-10 NOTE — Progress Notes (Addendum)
Cardiology Office Note Date:  03/10/2021  Patient ID:  Ian Cummings, Ian Cummings Feb 18, 1929, MRN [id number] PCP:  Ginger Organ., MD  Cardiologist:  Dr. Bettina Gavia Electrophysiologist: Dr. Quentin Ore    Chief Complaint: planned follow up  History of Present Illness: Ian Cummings is a 87 y.o. male with history of CAD (CABG remotely, ACS July 2021, disease not amenable to intervention), AFib, sinus node dysfunction w/PPM, HTN, HLD, CKD (III), renal mass (notes report suspicious for malignancy, though pt/family/urologist have decided not to pursue nephrectomy), PVD (R carotid stent, 2013), hypothyroidism, OSA w/CPAP, TIA  He saw Dr. Bettina Gavia 02/06/21, c/o increasing weakness, his diuretic reduced and his stain to see if this helped with his weakness. Planned to update his labs  He saw Dr. Quentin Ore 03/05/21, discussed an ER visit with SOB and lasix added, though at the time of his visit with some lightheadedness and suspect to be dry, his lasix stopped for PRN use. Planned for early follow up to re-assess his volume status His amio was reduced to 100mg  daily Labs updated   TODAY He is doing OK Orthostatic symptoms are better No edema, weight at home has been steady about 149lbs, wonders if his home weight is accurate given his weight here today. NO SOB No CP No dizziness, near syncope or syncope No bleeding or signs of bleeding  He has not used lasix since his visit   Device information Biotronik PPM implanted 05/21/2020  AAD hx Amiodarone started 2014  Past Medical History:  Diagnosis Date   AAA (abdominal aortic aneurysm)    2004, Pt stated not there now   Anginal pain ([place])    Anxiety    Arthritis    Atherosclerosis of native arteries of the extremities with intermittent claudication 12/04/2011   ABI .8 and .54 in 2012    Atherosclerotic PVD with intermittent claudication ([place]) 02/20/2014   BPH (benign prostatic hypertrophy)    Bradycardia 01/12/2019   Bradycardia on ECG  11/11/2017   CAD (coronary artery disease)    Carotid artery disease ([place])    Right carotid stent 2013   Carotid artery disease without cerebral infarction [facility])    S/p right carotid stent for asymptomatic disease  2013    Chronic diastolic (congestive) heart failure ([place]) 09/10/2019   Chronic kidney disease, stage 3a ([place]) 09/10/2019   Claudication ([place])    Colon polyps    adenomatous   Complex sleep apnea syndrome 11/11/2017   Conjunctival lesion 05/04/2014   Formatting of this note might be different from the original. left   Coronary artery disease    Coronary artery disease involving native coronary artery of native heart with unstable angina pectoris ([place]) 09/06/2012   CABG w LIMA to LAD, SVG to OM1-OM2, SVG to dx, SVG to PD/PL 1995,  Cypher stent x 2 SVG to RCA and Cypher stent to Circ Graft 05/13/07, stent of SVG to circ OM in [place] in July 2013  Cath 4/14 [facility] normal Left main, occluded mid LAD, occluded RCA SVG, occluded CFX, widely patent OM 1 SVG, occluded RCA SVG, occluded Diag 1 SVG, widely patent LAD LIMA graft;  Redo CABG Dr. Servando Snare  10/18/12 with    Depression    Essential hypertension 01/12/2019   GERD (gastroesophageal reflux disease)    Gout    H/O hiatal hernia    Hyperlipidemia    Hypertension    Hypertension, renal disease, stage 1-4 or unspecified chronic kidney disease    Hypothyroidism  Malnutrition of moderate degree 08/05/2015   MI (myocardial infarction) ([place]) 4/14   Mixed hyperlipidemia    Neuropathy    Hx: of in B/L toes   OSA on CPAP 02/17/2018   PAD (peripheral artery disease) ([place]) 08/03/2015   PAF (paroxysmal atrial fibrillation) ([place])    Peripheral vascular disease ([place])    with claudication   Pre-diabetes    RBBB 01/12/2019   s/p CABG    Shoulder pain 01/27/2019   Stroke ([place])    TIA (transient ischemic attack)    Hx: of   Unstable angina ([place]) 09/10/2019    Past Surgical History:  Procedure Laterality Date   CARDIOVERSION N/A 02/22/2020    Procedure: CARDIOVERSION;  Surgeon: [name], MD;  Location: [place];  Service: Cardiovascular;  Laterality: N/A;   CAROTID STENT INSERTION Right 2013   right at [place] EXTRACTION Bilateral    Hx: of both eyes   COLONOSCOPY     Hx: of   COLONOSCOPY     CORONARY ANGIOPLASTY WITH STENT PLACEMENT  2010   CORONARY ARTERY BYPASS GRAFT  1995   CORONARY ARTERY BYPASS GRAFT N/A 10/18/2012   Procedure: REDO CORONARY ARTERY BYPASS GRAFTING (CABG);  Surgeon: Grace Isaac, MD;  Location: [place];  Service: Open Heart Surgery;  Laterality: N/A;  off pump times two using endoscopically harvested left saphenous vein   CORONARY STENT PLACEMENT  06/2016   ENDARTERECTOMY FEMORAL Right 08/03/2015   Procedure: RIGHT FEMORAL ENDARTERECTOMY WITH PATCH ANGIOPLASTY;  Surgeon: Serafina Mitchell, MD;  Location: [place];  Service: Vascular;  Laterality: Right;   FEMORAL-POPLITEAL BYPASS GRAFT Right 08/03/2015   Procedure: RIGHT FEMORAL-BELOW KNEE POPLITEAL ARTERY BYPASS GRAFT;  Surgeon: Serafina Mitchell, MD;  Location: [place];  Service: Vascular;  Laterality: Right;   FOOT SURGERY Left    FRACTURE SURGERY Left    Hx: of Left heel   INGUINAL HERNIA REPAIR Bilateral    X 2    INTRAOPERATIVE TRANSESOPHAGEAL ECHOCARDIOGRAM N/A 10/18/2012   Procedure: INTRAOPERATIVE TRANSESOPHAGEAL ECHOCARDIOGRAM;  Surgeon: Grace Isaac, MD;  Location: [place];  Service: Open Heart Surgery;  Laterality: N/A;   LEFT HEART CATH AND CORS/GRAFTS ANGIOGRAPHY N/A 10/25/2019   Procedure: LEFT HEART CATH AND CORS/GRAFTS ANGIOGRAPHY;  Surgeon: Leonie Man, MD;  Location: [place] CV LAB;  Service: Cardiovascular;  Laterality: N/A;   LEFT HEART CATHETERIZATION WITH CORONARY/GRAFT ANGIOGRAM N/A 02/02/2013   Procedure: LEFT HEART CATHETERIZATION WITH Beatrix Fetters;  Surgeon: Jacolyn Reedy, MD;  Location: [facility] CATH LAB;  Service: Cardiovascular;  Laterality: N/A;   LOWER EXTREMITY ANGIOGRAM Right  06/27/2015   Procedure: Lower Extremity Angiogram;  Surgeon: Serafina Mitchell, MD;  Location: [place] CV LAB;  Service: Cardiovascular;  Laterality: Right;   MASTOIDECTOMY  1933   PACEMAKER IMPLANT N/A 05/21/2020   Procedure: PACEMAKER IMPLANT;  Surgeon: Vickie Epley, MD;  Location: [place] CV LAB;  Service: Cardiovascular;  Laterality: N/A;   PERIPHERAL VASCULAR CATHETERIZATION N/A 01/23/2015   Procedure: Abdominal Aortogram;  Surgeon: Serafina Mitchell, MD;  Location: [place] CV LAB;  Service: Cardiovascular;  Laterality: N/A;   PERIPHERAL VASCULAR CATHETERIZATION N/A 06/27/2015   Procedure: Abdominal Aortogram;  Surgeon: Serafina Mitchell, MD;  Location: [place] CV LAB;  Service: Cardiovascular;  Laterality: N/A;   RETINAL DETACHMENT SURGERY Left    Hx: of left eye   TEE WITHOUT CARDIOVERSION N/A 02/22/2020   Procedure: TRANSESOPHAGEAL ECHOCARDIOGRAM (TEE);  Surgeon: [name], MD;  Location: [facility];  Service: Cardiovascular;  Laterality: N/A;   TONSILLECTOMY      Current Outpatient Medications  Medication Sig Dispense Refill   allopurinol (ZYLOPRIM) 100 MG tablet Take 100 mg by mouth daily.      amiodarone (PACERONE) 200 MG tablet Take 0.5 tablets (100 mg total) by mouth daily. 45 tablet 3   clopidogrel (PLAVIX) 75 MG tablet Take 1 tablet (75 mg total) by mouth daily.     escitalopram (LEXAPRO) 10 MG tablet Take 10 mg by mouth daily.     furosemide (LASIX) 40 MG tablet Take 40 mg by mouth as needed (if weight greater than 157 lb).     isosorbide mononitrate (IMDUR) 30 MG 24 hr tablet Take 30 mg by mouth daily.     lansoprazole (PREVACID) 30 MG capsule Take 30 mg by mouth daily.     levothyroxine (SYNTHROID) 100 MCG tablet Take 1 tablet (100 mcg total) by mouth daily before breakfast. 90 tablet 3   multivitamin (RENA-VIT) TABS tablet Take 1 tablet by mouth daily.     nitroGLYCERIN (NITROSTAT) 0.4 MG SL tablet Place 0.4 mg under the tongue every 5 (five) minutes  as needed for chest pain.      rivaroxaban (XARELTO) 20 MG TABS tablet Take 1 tablet (20 mg total) by mouth daily with supper. 90 tablet 3   rosuvastatin (CRESTOR) 40 MG tablet Take 40 mg by mouth every other day.     sacubitril-valsartan (ENTRESTO) 24-26 MG Take 1 tablet by mouth 2 (two) times daily. 180 tablet 3   No current facility-administered medications for this visit.    Allergies:   Atorvastatin, Amoxicillin, and Metoprolol   Social History:  The patient  reports that he quit smoking about 45 years ago. His smoking use included cigarettes. He has never used smokeless tobacco. He reports current alcohol use of about 3.0 standard drinks per week. He reports that he does not use drugs.   Family History:  The patient's family history includes Diabetes in his father; Heart attack in his mother; Heart disease in his father and mother; Hyperlipidemia in his father; Hypertension in his father; Stroke in his sister.  ROS:  Please see the history of present illness.    All other systems are reviewed and otherwise negative.   PHYSICAL EXAM:  VS:  There were no vitals taken for this visit. BMI: There is no height or weight on file to calculate BMI. Well nourished, well developed, in no acute distress, looks younger then his age 85: normocephalic, atraumatic Neck: no JVD, carotid bruits or masses Cardiac:  RRR; no significant murmurs, no rubs, or gallops Lungs:  CTA b/l, no wheezing, rhonchi or rales Abd: soft, nontender MS: no deformity or atrophy Ext: no edema Skin: warm and dry, no rash Neuro:  No gross deficits appreciated Psych: euthymic mood, full affect  PPM site is stable, no tethering or discomfort   EKG:  done today and reviewed by myself AP/VS 80bpm  Device interrogation done today and reviewed by myself:  Battery and lead measurements are good 89%AP 7%VP + AMS, (20), longest 19 min,  Device reports zero% burden   02/22/2020: TTE IMPRESSIONS   1. Left  ventricular ejection fraction, by estimation, is 25 to 30%. The  left ventricle has severely decreased function. The left ventricle  demonstrates global hypokinesis.   2. Right ventricular systolic function is mildly reduced. The right  ventricular size is normal.   3. Left atrial size was severely dilated.  No left atrial/left atrial  appendage thrombus was detected. The LAA emptying velocity was 54 cm/s.   4. Right atrial size was severely dilated.   5. The mitral valve is normal in structure. Mild to moderate mitral valve  regurgitation. No evidence of mitral stenosis. There is mild prolapse of  anterior leaflet of the mitral valve.   6. The aortic valve is tricuspid. There is mild calcification of the  aortic valve. There is mild thickening of the aortic valve. Aortic valve  regurgitation is not visualized. No aortic stenosis is present.   Conclusion(s)/Recommendation(s): Normal biventricular function without  evidence of hemodynamically significant valvular heart disease.    10/25/2019: LHC Prox RCA to Dist RCA lesion is 100% stenosed. Prox Cx to Dist Cx lesion is 100% stenosed with 100% stenosed side branch in 2nd Mrg. Prox LAD lesion is 75% stenosed. 2nd Diag lesion is 70% stenosed. Mid LAD-1 lesion is 65% stenosed with 60% stenosed side branch in 1st Sept. Mid LAD-2 lesion is 99% stenosed / subtotally occluded (no competetive / retrograde flow via LIMA). LIMA-LAD graft was visualized by angiography and is normal in caliber. The graft exhibits no disease. Dist LAD lesion is 50% stenosed. Seq SVG- OM2-OM3; (known occluded 2nd Limb) was visualized by angiography and is large. Origin lesion is 40% stenosed. Prox Graft STENT is 10% stenosed. Mid Graft to Dist Graft STENT is 20% stenosed. Original SVG-PDA-PL not injected due to known occlusion is large with extensive stents. ReDo SVG-dRCA graft was visualized by angiography and is large. Prox Graft to Mid Graft lesion is 100%  stenosed. There is mild to moderate left ventricular systolic dysfunction. The left ventricular ejection fraction is 45-50% by visual estimate. There is no aortic valve stenosis.   Severe native disease:  Known 100 % occluded proximal RCA and proximal LCx as well as LAD after 1st Sept & 2nd Diag 2nd Diag  Patent LIMA-LAD --> 2nd Sept fill retrograde via LIMA-LAD is widely patent) with a 70% eccentric lesion just proximal to 1st Diag,  Patent proximal limb of SVG-OM-2 (occluded second limb OM to OM 3),  Known occlusion of old SVG-rPDA-PL Likely culprit lesion being 100% thrombotic occlusion of new SVG-RPDA. Collateral flow fills from SP1 SP2 PDA septals and from distal LCx collaterals to RPL -> provides retrograde filling of the RCA at least back to the distal bend Preserved LVEF with basal to mid inferior hypokinesis but otherwise EF of 50 to 55% Normal LVEDP     Plan will be to optimize medical management as the likely culprit lesion is the occluded SVG-RCA.  If symptoms do not improve on medical therapy, can consider bifurcation PCI of the lesion just prior to D1 in the LAD.  (Somewhat complicated by the fact that the distal LAD beyond D1 is a size mismatch, and there is diffuse disease crossing SP1 and D2)        Recent Labs: 07/25/2020: NT-Pro BNP 775 03/05/2021: ALT 45; BUN 26; Creatinine, Ser 1.50; Hemoglobin WILL FOLLOW; Platelets WILL FOLLOW; Potassium 3.8; Sodium 137; TSH 10.000  No results found for requested labs within last 8760 hours.   Estimated Creatinine Clearance: 31.9 mL/min (A) (by C-G formula based on SCr of 1.5 mg/dL (H)).   Wt Readings from Last 3 Encounters:  03/05/21 152 lb 3.2 oz (69 kg)  02/06/21 155 lb (70.3 kg)  01/28/21 155 lb (70.3 kg)     Other studies reviewed: Additional studies/records reviewed today include: summarized above  ASSESSMENT AND PLAN:  PPM Intact function No programming changes made  CAD No anginal complaints On plavix,  nitrate, statin No BB "itching" with BB reported  Paroxysmal Afib CHA2DS2Vasc is 6, on Xarelto Amiodarone Will need follow up TSH on lowered dose  0 % burden  NOTED AFTER the patient left his last labs Creat 1.5 gives CalcCrCl 33, I have messaged my MA to reduce his Xarelto dose to 15mg  daily  ICM Chronic CHF Appears compensated On Entresto, nitrate intolerant/allergic to BB (metoprolol) with itching  Disposition: F/u with remotes as usual, EP in 41mo, sooner if needed  Current medicines are reviewed at length with the patient today.  The patient did not have any concerns regarding medicines.  Venetia Night, PA-C 03/10/2021 4:23 PM     [address] [place] [address] [phone] (office)  [phone] (fax)

## 2021-03-12 LAB — COMPREHENSIVE METABOLIC PANEL
ALT: 45 IU/L — ABNORMAL HIGH (ref 0–44)
AST: 42 IU/L — ABNORMAL HIGH (ref 0–40)
Albumin/Globulin Ratio: 1.1 — ABNORMAL LOW (ref 1.2–2.2)
Albumin: 4 g/dL (ref 3.5–4.6)
Alkaline Phosphatase: 58 IU/L (ref 44–121)
BUN/Creatinine Ratio: 17 (ref 10–24)
BUN: 26 mg/dL (ref 10–36)
Bilirubin Total: 0.4 mg/dL (ref 0.0–1.2)
CO2: 25 mmol/L (ref 20–29)
Calcium: 9.2 mg/dL (ref 8.6–10.2)
Chloride: 98 mmol/L (ref 96–106)
Creatinine, Ser: 1.5 mg/dL — ABNORMAL HIGH (ref 0.76–1.27)
Globulin, Total: 3.5 g/dL (ref 1.5–4.5)
Glucose: 90 mg/dL (ref 70–99)
Potassium: 3.8 mmol/L (ref 3.5–5.2)
Sodium: 137 mmol/L (ref 134–144)
Total Protein: 7.5 g/dL (ref 6.0–8.5)
eGFR: 44 mL/min/{1.73_m2} — ABNORMAL LOW (ref >59)

## 2021-03-12 LAB — TSH: TSH: 10 u[IU]/mL — ABNORMAL HIGH (ref 0.450–4.500)

## 2021-03-12 LAB — T4, FREE: Free T4: 1.53 ng/dL (ref 0.82–1.77)

## 2021-03-12 LAB — SPECIMEN STATUS REPORT

## 2021-03-14 ENCOUNTER — Encounter: Payer: Self-pay | Admitting: Physician Assistant

## 2021-03-14 ENCOUNTER — Other Ambulatory Visit: Payer: Self-pay

## 2021-03-14 ENCOUNTER — Ambulatory Visit: Payer: Medicare HMO | Admitting: Physician Assistant

## 2021-03-14 VITALS — BP 128/70 | HR 80 | Ht 70.5 in | Wt 157.2 lb

## 2021-03-14 DIAGNOSIS — I480 Paroxysmal atrial fibrillation: Secondary | ICD-10-CM

## 2021-03-14 DIAGNOSIS — I5022 Chronic systolic (congestive) heart failure: Secondary | ICD-10-CM

## 2021-03-14 DIAGNOSIS — Z950 Presence of cardiac pacemaker: Secondary | ICD-10-CM | POA: Diagnosis not present

## 2021-03-14 DIAGNOSIS — I255 Ischemic cardiomyopathy: Secondary | ICD-10-CM

## 2021-03-14 DIAGNOSIS — I2510 Atherosclerotic heart disease of native coronary artery without angina pectoris: Secondary | ICD-10-CM

## 2021-03-14 DIAGNOSIS — I4510 Unspecified right bundle-branch block: Secondary | ICD-10-CM

## 2021-03-14 LAB — CUP PACEART INCLINIC DEVICE CHECK
Date Time Interrogation Session: 20221117231747
Implantable Lead Implant Date: 20220124
Implantable Lead Implant Date: 20220124
Implantable Lead Location: 753859
Implantable Lead Location: 753860
Implantable Lead Model: 377
Implantable Lead Model: 377
Implantable Lead Serial Number: 8000155840
Implantable Lead Serial Number: 8000166398
Implantable Pulse Generator Implant Date: 20220124
Lead Channel Impedance Value: 526 Ohm
Lead Channel Impedance Value: 624 Ohm
Lead Channel Pacing Threshold Amplitude: 0.7 V
Lead Channel Pacing Threshold Amplitude: 0.7 V
Lead Channel Pacing Threshold Amplitude: 0.8 V
Lead Channel Pacing Threshold Amplitude: 1.5 V
Lead Channel Pacing Threshold Amplitude: 1.5 V
Lead Channel Pacing Threshold Amplitude: 1.7 V
Lead Channel Pacing Threshold Pulse Width: 0.4 ms
Lead Channel Pacing Threshold Pulse Width: 0.4 ms
Lead Channel Pacing Threshold Pulse Width: 0.4 ms
Lead Channel Pacing Threshold Pulse Width: 0.4 ms
Lead Channel Pacing Threshold Pulse Width: 0.4 ms
Lead Channel Pacing Threshold Pulse Width: 0.4 ms
Lead Channel Sensing Intrinsic Amplitude: 0.9 mV
Lead Channel Sensing Intrinsic Amplitude: 10.4 mV
Lead Channel Sensing Intrinsic Amplitude: 10.7 mV
Lead Channel Sensing Intrinsic Amplitude: 2 mV
Lead Channel Setting Pacing Amplitude: 1.3 V
Lead Channel Setting Pacing Amplitude: 2.7 V
Lead Channel Setting Pacing Pulse Width: 0.4 ms
Pulse Gen Model: 407145
Pulse Gen Serial Number: 70002016

## 2021-03-14 NOTE — Patient Instructions (Addendum)
Medication Instructions:   TAKE LASIX 3 LBS IN 24 HOURS OR 5 LBS IN A WEEK  OR WEIGHT 157  LBS OR MORE  *If you need a refill on your cardiac medications before your next appointment, please call your pharmacy*   Lab Work:  NONE ORDERED  TODAY   If you have labs (blood work) drawn today and your tests are completely normal, you will receive your results only by: [place] (if you have MyChart) OR A paper copy in the mail If you have any lab test that is abnormal or we need to change your treatment, we will call you to review the results.   Testing/Procedures: NONE ORDERED  TODAY    Follow-Up: At [facility], you and your health needs are our priority.  As part of our continuing mission to provide you with exceptional heart care, we have created designated Provider Care Teams.  These Care Teams include your primary Cardiologist (physician) and Advanced Practice Providers (APPs -  Physician Assistants and Nurse Practitioners) who all work together to provide you with the care you need, when you need it.  We recommend signing up for the patient portal called "MyChart".  Sign up information is provided on this After Visit Summary.  MyChart is used to connect with patients for Virtual Visits (Telemedicine).  Patients are able to view lab/test results, encounter notes, upcoming appointments, etc.  Non-urgent messages can be sent to your provider as well.   To learn more about what you can do with MyChart, go to [email or web address].    Your next appointment:   1 year(s)  The format for your next appointment:   In Person  Provider:   You may see Vickie Epley, MD or one of the following Advanced Practice Providers on your designated Care Team:   Tommye Standard, [place] Legrand Como "Jonni Sanger" Chalmers Cater, PA-C{ :  Other Instructions

## 2021-03-15 ENCOUNTER — Telehealth: Payer: Self-pay | Admitting: *Deleted

## 2021-03-15 MED ORDER — RIVAROXABAN 15 MG PO TABS: 15.0000 mg | ORAL_TABLET | Freq: Every day | ORAL | 0 refills | Status: DC

## 2021-03-15 NOTE — Telephone Encounter (Signed)
Spoke with patient and aware of  medication changes Xarelto 20 mg to 15 mg  and lab work  Tsh needed at follow up visit with Dr. Bettina Gavia  Dec. Patient verbalized understanding.

## 2021-03-15 NOTE — Addendum Note (Signed)
Addended by: Claude Manges on: 03/15/2021 12:02 PM   Modules accepted: Orders

## 2021-03-18 ENCOUNTER — Telehealth: Payer: Self-pay | Admitting: Physician Assistant

## 2021-03-18 NOTE — Telephone Encounter (Signed)
Spoke with pt who reports he has been so weak over the weekend he is barely able to get out of his recliner.  Pt states he feels as if he is going to fall when he stands up.  Pt denies current CP, SOB or dizziness.  He states he does not feel as if he is in Afib.  He wonders if needs some additional lab work.  He has increase his fluid intake and weight at home is stable at 147.  He is eating regular meals and reports he took an extra half of his Levothyroxine yesterday due to his extreme weakness.  He did not take his BP yesterday or today. Requested pt check his BP this morning and will forward information to Tommye Standard, PA-C for review and further recommendation.  Pt verbalizes understanding and agrees with current plan.

## 2021-03-18 NOTE — Telephone Encounter (Signed)
Spoke to the patient just now and got him scheduled to see Dr. Geraldo Pitter tomorrow at 1pm as Dr. Bettina Gavia said to force him into the schedule in [facility] due to the patient being physically unable to come to [facility].    Encouraged patient to call back with any questions or concerns.

## 2021-03-18 NOTE — Telephone Encounter (Signed)
Per Tommye Standard: Please get the BP information.    Hold his Entresto and yes, if he can get in to see Dr. Bettina Gavia that would be ideal   Spoke with pt who reports his current BP is 127/78.  Pt states he has discovered that Furosemide was accidentally put in his pill box and he was taken off this medication.  Pt states he has probably been unknowingly taking the Furosemide for about 5 days.  He would like to give his body a few days to clear this medication ans see if he feels better before he makes any additional changes.  Pt advised will forward to PA to make her aware.

## 2021-03-18 NOTE — Telephone Encounter (Signed)
Patient saw [name] last week.  He has been very weak this past weekend.  He is wondering if he needs lab work, or if he should go see Dr. Bettina Gavia.

## 2021-03-19 ENCOUNTER — Ambulatory Visit: Payer: Medicare HMO | Admitting: Cardiology

## 2021-03-19 ENCOUNTER — Encounter: Payer: Self-pay | Admitting: Cardiology

## 2021-03-19 ENCOUNTER — Other Ambulatory Visit: Payer: Self-pay

## 2021-03-19 VITALS — BP 110/52 | HR 76 | Ht 71.0 in | Wt 154.1 lb

## 2021-03-19 DIAGNOSIS — I5033 Acute on chronic diastolic (congestive) heart failure: Secondary | ICD-10-CM | POA: Insufficient documentation

## 2021-03-19 DIAGNOSIS — I2510 Atherosclerotic heart disease of native coronary artery without angina pectoris: Secondary | ICD-10-CM | POA: Diagnosis not present

## 2021-03-19 DIAGNOSIS — I25810 Atherosclerosis of coronary artery bypass graft(s) without angina pectoris: Secondary | ICD-10-CM | POA: Insufficient documentation

## 2021-03-19 DIAGNOSIS — I480 Paroxysmal atrial fibrillation: Secondary | ICD-10-CM

## 2021-03-19 DIAGNOSIS — I255 Ischemic cardiomyopathy: Secondary | ICD-10-CM

## 2021-03-19 DIAGNOSIS — E782 Mixed hyperlipidemia: Secondary | ICD-10-CM | POA: Diagnosis not present

## 2021-03-19 DIAGNOSIS — I10 Essential (primary) hypertension: Secondary | ICD-10-CM

## 2021-03-19 DIAGNOSIS — D649 Anemia, unspecified: Secondary | ICD-10-CM | POA: Insufficient documentation

## 2021-03-19 DIAGNOSIS — Z0000 Encounter for general adult medical examination without abnormal findings: Secondary | ICD-10-CM | POA: Insufficient documentation

## 2021-03-19 DIAGNOSIS — I5022 Chronic systolic (congestive) heart failure: Secondary | ICD-10-CM | POA: Insufficient documentation

## 2021-03-19 DIAGNOSIS — N2889 Other specified disorders of kidney and ureter: Secondary | ICD-10-CM | POA: Insufficient documentation

## 2021-03-19 DIAGNOSIS — D6859 Other primary thrombophilia: Secondary | ICD-10-CM | POA: Insufficient documentation

## 2021-03-19 NOTE — Progress Notes (Signed)
Cardiology Office Note:    Date:  03/19/2021   ID:  Ian Cummings, DOB 1928/09/10, MRN [id number]  PCP:  Ginger Organ., MD  Cardiologist:  Jenean Lindau, MD   Referring MD: Ginger Organ., MD    ASSESSMENT:    1. Coronary artery disease involving native coronary artery of native heart without angina pectoris   2. Essential hypertension   3. Mixed dyslipidemia   4. Ischemic cardiomyopathy    PLAN:    In order of problems listed above:  Coronary artery disease: Secondary prevention stressed with the patient.  Importance of compliance with diet medication stressed any vocalized understanding. Ischemic cardiomyopathy: Patient is on minimal medications because of advanced age and borderline blood pressure.  We will do an echocardiogram to follow-up for his ischemic cardiomyopathy status and ejection fraction. Essential hypertension: Blood pressure is borderline.  He gives history of weakness at times and I am concerned about his blood pressure.  He does not have chest pain.  I told him to hold off his isosorbide to see if this helps.  If it helps him he will discontinue it.  If he feels that he has any chest pain or anginal-like symptoms that he comes in he will start this medication and get in touch with [place]. Mixed dyslipidemia: Diet was emphasized.  Lifestyle modification urged.  He is on statin therapy. Paroxysmal atrial fibrillation:I discussed with the patient atrial fibrillation, disease process. Management and therapy including rate and rhythm control, anticoagulation benefits and potential risks were discussed extensively with the patient. Patient had multiple questions which were answered to patient's satisfaction.  Patient has a pacemaker and followed by electrophysiology colleagues.  I mentioned to him risks of amiodarone therapy and he vocalized understanding and questions were answered to satisfaction.  Patient has abnormal TSH and this is followed by primary care.   He is aware of this. Permanent pacemaker: Followed by electrophysiology colleagues.   Medication Adjustments/Labs and Tests Ordered: Current medicines are reviewed at length with the patient today.  Concerns regarding medicines are outlined above.  Orders Placed This Encounter  Procedures   EKG 12-Lead   ECHOCARDIOGRAM COMPLETE    No orders of the defined types were placed in this encounter.    No chief complaint on file.    History of Present Illness:    Ian Cummings is a 87 y.o. male.  Patient has past medical history of coronary artery disease post CABG surgery, ischemic cardiomyopathy, essential hypertension and dyslipidemia.  He has paroxysmal atrial fibrillation on amiodarone and has permanent pacemaker.  He is previously unknown to me.  He has multiple complex medical issues.  At the time of my evaluation, the patient is alert awake oriented and in no distress.  Past Medical History:  Diagnosis Date   AAA (abdominal aortic aneurysm)    2004, Pt stated not there now   Anginal pain ([place])    Anxiety    Arthritis    Atherosclerosis of native arteries of the extremities with intermittent claudication 12/04/2011   ABI .79 and .54 in 2012    Atherosclerotic PVD with intermittent claudication ([place]) 02/20/2014   BPH (benign prostatic hypertrophy)    Bradycardia 01/12/2019   Bradycardia on ECG 11/11/2017   CAD (coronary artery disease)    Carotid artery disease ([place])    Right carotid stent 2013   Carotid artery disease without cerebral infarction [facility])    S/p right carotid stent for asymptomatic disease  2013  Chronic diastolic (congestive) heart failure (HCC) 09/10/2019   Chronic kidney disease, stage 3a ([place]) 09/10/2019   Claudication ([place])    Colon polyps    adenomatous   Complex sleep apnea syndrome 11/11/2017   Conjunctival lesion 05/04/2014   Formatting of this note might be different from the original. left   Coronary artery disease    Coronary artery disease involving  native coronary artery of native heart with unstable angina pectoris ([place]) 09/06/2012   CABG w LIMA to LAD, SVG to OM1-OM2, SVG to dx, SVG to PD/PL 1995,  Cypher stent x 2 SVG to RCA and Cypher stent to Circ Graft 05/13/07, stent of SVG to circ OM in [place] in July 2013  Cath 4/14 [facility] normal Left main, occluded mid LAD, occluded RCA SVG, occluded CFX, widely patent OM 1 SVG, occluded RCA SVG, occluded Diag 1 SVG, widely patent LAD LIMA graft;  Redo CABG Dr. Servando Snare  10/18/12 with    Depression    Essential hypertension 01/12/2019   GERD (gastroesophageal reflux disease)    Gout    H/O hiatal hernia    Hyperlipidemia    Hypertension    Hypertension, renal disease, stage 1-4 or unspecified chronic kidney disease    Hypothyroidism    Malnutrition of moderate degree 08/05/2015   MI (myocardial infarction) ([place]) 4/14   Mixed hyperlipidemia    Neuropathy    Hx: of in B/L toes   OSA on CPAP 02/17/2018   PAD (peripheral artery disease) ([place]) 08/03/2015   PAF (paroxysmal atrial fibrillation) ([place])    Peripheral vascular disease ([place])    with claudication   Pre-diabetes    RBBB 01/12/2019   s/p CABG    Shoulder pain 01/27/2019   Stroke ([place])    TIA (transient ischemic attack)    Hx: of   Unstable angina ([place]) 09/10/2019    Past Surgical History:  Procedure Laterality Date   CARDIOVERSION N/A 02/22/2020   Procedure: CARDIOVERSION;  Surgeon: [name], MD;  Location: [place];  Service: Cardiovascular;  Laterality: N/A;   CAROTID STENT INSERTION Right 2013   right at [place] EXTRACTION Bilateral    Hx: of both eyes   COLONOSCOPY     Hx: of   COLONOSCOPY     CORONARY ANGIOPLASTY WITH STENT PLACEMENT  2010   CORONARY ARTERY BYPASS GRAFT  1995   CORONARY ARTERY BYPASS GRAFT N/A 10/18/2012   Procedure: REDO CORONARY ARTERY BYPASS GRAFTING (CABG);  Surgeon: Grace Isaac, MD;  Location: [place];  Service: Open Heart Surgery;  Laterality: N/A;  off pump times two  using endoscopically harvested left saphenous vein   CORONARY STENT PLACEMENT  06/2016   ENDARTERECTOMY FEMORAL Right 08/03/2015   Procedure: RIGHT FEMORAL ENDARTERECTOMY WITH PATCH ANGIOPLASTY;  Surgeon: Serafina Mitchell, MD;  Location: [place];  Service: Vascular;  Laterality: Right;   FEMORAL-POPLITEAL BYPASS GRAFT Right 08/03/2015   Procedure: RIGHT FEMORAL-BELOW KNEE POPLITEAL ARTERY BYPASS GRAFT;  Surgeon: Serafina Mitchell, MD;  Location: [place];  Service: Vascular;  Laterality: Right;   FOOT SURGERY Left    FRACTURE SURGERY Left    Hx: of Left heel   INGUINAL HERNIA REPAIR Bilateral    X 2    INTRAOPERATIVE TRANSESOPHAGEAL ECHOCARDIOGRAM N/A 10/18/2012   Procedure: INTRAOPERATIVE TRANSESOPHAGEAL ECHOCARDIOGRAM;  Surgeon: Grace Isaac, MD;  Location: [place];  Service: Open Heart Surgery;  Laterality: N/A;   LEFT HEART CATH AND CORS/GRAFTS ANGIOGRAPHY N/A 10/25/2019   Procedure: LEFT  HEART CATH AND CORS/GRAFTS ANGIOGRAPHY;  Surgeon: Leonie Man, MD;  Location: [place] CV LAB;  Service: Cardiovascular;  Laterality: N/A;   LEFT HEART CATHETERIZATION WITH CORONARY/GRAFT ANGIOGRAM N/A 02/02/2013   Procedure: LEFT HEART CATHETERIZATION WITH Beatrix Fetters;  Surgeon: Jacolyn Reedy, MD;  Location: [facility] CATH LAB;  Service: Cardiovascular;  Laterality: N/A;   LOWER EXTREMITY ANGIOGRAM Right 06/27/2015   Procedure: Lower Extremity Angiogram;  Surgeon: Serafina Mitchell, MD;  Location: [place] CV LAB;  Service: Cardiovascular;  Laterality: Right;   MASTOIDECTOMY  1933   PACEMAKER IMPLANT N/A 05/21/2020   Procedure: PACEMAKER IMPLANT;  Surgeon: Vickie Epley, MD;  Location: [place] CV LAB;  Service: Cardiovascular;  Laterality: N/A;   PERIPHERAL VASCULAR CATHETERIZATION N/A 01/23/2015   Procedure: Abdominal Aortogram;  Surgeon: Serafina Mitchell, MD;  Location: [place] CV LAB;  Service: Cardiovascular;  Laterality: N/A;   PERIPHERAL VASCULAR CATHETERIZATION N/A 06/27/2015    Procedure: Abdominal Aortogram;  Surgeon: Serafina Mitchell, MD;  Location: [place] CV LAB;  Service: Cardiovascular;  Laterality: N/A;   RETINAL DETACHMENT SURGERY Left    Hx: of left eye   TEE WITHOUT CARDIOVERSION N/A 02/22/2020   Procedure: TRANSESOPHAGEAL ECHOCARDIOGRAM (TEE);  Surgeon: [name], MD;  Location: [facility] ENDOSCOPY;  Service: Cardiovascular;  Laterality: N/A;   TONSILLECTOMY      Current Medications: Current Meds  Medication Sig   allopurinol (ZYLOPRIM) 100 MG tablet Take 100 mg by mouth daily.    amiodarone (PACERONE) 200 MG tablet Take 0.5 tablets (100 mg total) by mouth daily.   clopidogrel (PLAVIX) 75 MG tablet Take 1 tablet (75 mg total) by mouth daily.   escitalopram (LEXAPRO) 10 MG tablet Take 10 mg by mouth daily.   isosorbide mononitrate (IMDUR) 30 MG 24 hr tablet Take 30 mg by mouth daily.   lansoprazole (PREVACID) 30 MG capsule Take 30 mg by mouth daily.   levothyroxine (SYNTHROID) 100 MCG tablet Take 1 tablet (100 mcg total) by mouth daily before breakfast.   multivitamin (RENA-VIT) TABS tablet Take 1 tablet by mouth daily.   nitroGLYCERIN (NITROSTAT) 0.4 MG SL tablet Place 0.4 mg under the tongue every 5 (five) minutes as needed for chest pain.    Rivaroxaban (XARELTO) 15 MG TABS tablet Take 1 tablet (15 mg total) by mouth daily with supper.   rosuvastatin (CRESTOR) 40 MG tablet Take 40 mg by mouth every other day.   sacubitril-valsartan (ENTRESTO) 24-26 MG Take 1 tablet by mouth 2 (two) times daily.     Allergies:   Atorvastatin, Amoxicillin, and Metoprolol   Social History   Socioeconomic History   Marital status: Married    Spouse name: Not on file   Number of children: 3   Years of education: Not on file   Highest education level: Not on file  Occupational History   Occupation: retired  Tobacco Use   Smoking status: Former    Types: Cigarettes    Quit date: 04/29/1975    Years since quitting: 45.9   Smokeless tobacco: Never  Vaping  Use   Vaping Use: Never used  Substance and Sexual Activity   Alcohol use: Yes    Alcohol/week: 3.0 standard drinks    Types: 3 Glasses of wine per week    Comment: 3 glasses of wine today   Drug use: No   Sexual activity: Not Currently  Other Topics Concern   Not on file  Social History Narrative   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 There is no history of Colon cancer, Stomach cancer, Rectal cancer, Esophageal cancer, Liver disease, or Kidney disease.  ROS:   Please see the history of present illness.    All other systems reviewed and are negative.  EKGs/Labs/Other Studies Reviewed:    The following studies were reviewed today: EKG reveals atrial paced rhythm and nonspecific ST-T changes   Recent Labs: 07/25/2020: NT-Pro BNP 775 03/05/2021: ALT 45; BUN 26; Creatinine, Ser 1.50; Hemoglobin WILL FOLLOW; Platelets WILL FOLLOW; Potassium 3.8; Sodium 137; TSH 10.000  Recent Lipid Panel    Component Value Date/Time   CHOL 121 01/23/2020 1040   TRIG 191 (H) 01/23/2020 1040   HDL 30 (L) 01/23/2020 1040   CHOLHDL 4.0 01/23/2020 1040   CHOLHDL 3.2 04/18/2009 0241   VLDL 23 04/18/2009 0241   LDLCALC 59 01/23/2020 1040    Physical Exam:    VS:  BP (!) 110/52   Pulse 76   Ht 5\' 11"  (1.803 m)   Wt 154 lb 1.3 oz (69.9 kg)   SpO2 95%   BMI 21.49 kg/m     Wt Readings from Last 3 Encounters:  03/19/21 154 lb 1.3 oz (69.9 kg)  03/14/21 157 lb 3.2 oz (71.3 kg)  03/05/21 152 lb 3.2 oz (69 kg)     GEN: Patient is in no acute  distress HEENT: Normal NECK: No JVD; No carotid bruits LYMPHATICS: No lymphadenopathy CARDIAC: Hear sounds regular, 2/6 systolic murmur at the apex. RESPIRATORY:  Clear to auscultation without rales, wheezing or rhonchi  ABDOMEN: Soft, non-tender, non-distended MUSCULOSKELETAL:  No edema; No deformity  SKIN: Warm and dry NEUROLOGIC:  Alert and oriented x 3 PSYCHIATRIC:  Normal affect   Signed, Jenean Lindau, MD  03/19/2021 1:23 PM    [facility]

## 2021-03-19 NOTE — Addendum Note (Signed)
Addended by: Danielle Dess on: 03/19/2021 10:23 AM   Modules accepted: Orders

## 2021-03-19 NOTE — Patient Instructions (Addendum)
Medication Instructions:  Your physician recommends that you continue on your current medications as directed. Please refer to the Current Medication list given to you today.  *If you need a refill on your cardiac medications before your next appointment, please call your pharmacy*   Lab Work: None If you have labs (blood work) drawn today and your tests are completely normal, you will receive your results only by: [place] (if you have MyChart) OR A paper copy in the mail If you have any lab test that is abnormal or we need to change your treatment, we will call you to review the results.   Testing/Procedures: Your physician has requested that you have an echocardiogram. Echocardiography is a painless test that uses sound waves to create images of your heart. It provides your doctor with information about the size and shape of your heart and how well your heart's chambers and valves are working. This procedure takes approximately one hour. There are no restrictions for this procedure.    Follow-Up: At [facility], you and your health needs are our priority.  As part of our continuing mission to provide you with exceptional heart care, we have created designated Provider Care Teams.  These Care Teams include your primary Cardiologist (physician) and Advanced Practice Providers (APPs -  Physician Assistants and Nurse Practitioners) who all work together to provide you with the care you need, when you need it.  We recommend signing up for the patient portal called "MyChart".  Sign up information is provided on this After Visit Summary.  MyChart is used to connect with patients for Virtual Visits (Telemedicine).  Patients are able to view lab/test results, encounter notes, upcoming appointments, etc.  Non-urgent messages can be sent to your provider as well.   To learn more about what you can do with MyChart, go to [email or web address].    Your next appointment:   4  month(s)  The format for your next appointment:   In Person  Provider:   Dr. Bettina Gavia    Other Instructions

## 2021-03-20 ENCOUNTER — Telehealth: Payer: Self-pay | Admitting: Cardiology

## 2021-03-20 NOTE — Telephone Encounter (Signed)
Pt is returning call from this morning, he said he have received several calls but no VM was left.

## 2021-03-20 NOTE — Telephone Encounter (Addendum)
Lvm for patient on home and wife number to call back due to current current pharmacy [facility]) is not responding to fax and no one answering calls either. Multiple attempts has been made to contact pharmacy have been unable to. Patient should contact office back as soon as possible so that we can make sure Patient is taking appropriate dose Xarelto 15 mg once a day.

## 2021-03-20 NOTE — Telephone Encounter (Signed)
Left a message for pt to call back.  I am unsure which office called pt .

## 2021-03-20 NOTE — Telephone Encounter (Signed)
Spoke with patient and stated will give me contact numbers  he has via Mychart  for Pharmacy to see if we have same number to see if that helps.

## 2021-03-20 NOTE — Telephone Encounter (Signed)
Lvm for patient to call back due to current

## 2021-03-25 DIAGNOSIS — R319 Hematuria, unspecified: Secondary | ICD-10-CM | POA: Diagnosis not present

## 2021-03-25 DIAGNOSIS — R300 Dysuria: Secondary | ICD-10-CM | POA: Diagnosis not present

## 2021-03-26 DIAGNOSIS — M7062 Trochanteric bursitis, left hip: Secondary | ICD-10-CM | POA: Diagnosis not present

## 2021-03-26 DIAGNOSIS — M79605 Pain in left leg: Secondary | ICD-10-CM | POA: Diagnosis not present

## 2021-03-27 ENCOUNTER — Encounter: Payer: Self-pay | Admitting: Cardiology

## 2021-03-27 DIAGNOSIS — I5033 Acute on chronic diastolic (congestive) heart failure: Secondary | ICD-10-CM | POA: Diagnosis not present

## 2021-03-27 DIAGNOSIS — I130 Hypertensive heart and chronic kidney disease with heart failure and stage 1 through stage 4 chronic kidney disease, or unspecified chronic kidney disease: Secondary | ICD-10-CM | POA: Diagnosis not present

## 2021-03-27 DIAGNOSIS — N1831 Chronic kidney disease, stage 3a: Secondary | ICD-10-CM | POA: Diagnosis not present

## 2021-03-27 DIAGNOSIS — E785 Hyperlipidemia, unspecified: Secondary | ICD-10-CM | POA: Diagnosis not present

## 2021-03-29 ENCOUNTER — Other Ambulatory Visit: Payer: Self-pay | Admitting: *Deleted

## 2021-03-29 ENCOUNTER — Encounter: Payer: Self-pay | Admitting: *Deleted

## 2021-03-29 DIAGNOSIS — R310 Gross hematuria: Secondary | ICD-10-CM | POA: Diagnosis not present

## 2021-03-29 DIAGNOSIS — D49511 Neoplasm of unspecified behavior of right kidney: Secondary | ICD-10-CM | POA: Diagnosis not present

## 2021-03-29 DIAGNOSIS — R338 Other retention of urine: Secondary | ICD-10-CM | POA: Diagnosis not present

## 2021-03-29 DIAGNOSIS — N401 Enlarged prostate with lower urinary tract symptoms: Secondary | ICD-10-CM | POA: Diagnosis not present

## 2021-03-29 MED ORDER — RIVAROXABAN 15 MG PO TABS: 15.0000 mg | ORAL_TABLET | Freq: Every day | ORAL | 2 refills | Status: DC

## 2021-04-01 ENCOUNTER — Ambulatory Visit: Payer: Medicare HMO | Admitting: Cardiology

## 2021-04-01 DIAGNOSIS — I25810 Atherosclerosis of coronary artery bypass graft(s) without angina pectoris: Secondary | ICD-10-CM | POA: Diagnosis not present

## 2021-04-01 DIAGNOSIS — E039 Hypothyroidism, unspecified: Secondary | ICD-10-CM | POA: Diagnosis not present

## 2021-04-01 DIAGNOSIS — R7301 Impaired fasting glucose: Secondary | ICD-10-CM | POA: Diagnosis not present

## 2021-04-01 DIAGNOSIS — N2889 Other specified disorders of kidney and ureter: Secondary | ICD-10-CM | POA: Diagnosis not present

## 2021-04-01 DIAGNOSIS — R310 Gross hematuria: Secondary | ICD-10-CM | POA: Diagnosis not present

## 2021-04-01 DIAGNOSIS — D6869 Other thrombophilia: Secondary | ICD-10-CM | POA: Diagnosis not present

## 2021-04-01 DIAGNOSIS — N1831 Chronic kidney disease, stage 3a: Secondary | ICD-10-CM | POA: Diagnosis not present

## 2021-04-01 DIAGNOSIS — I5022 Chronic systolic (congestive) heart failure: Secondary | ICD-10-CM | POA: Diagnosis not present

## 2021-04-01 DIAGNOSIS — R531 Weakness: Secondary | ICD-10-CM | POA: Diagnosis not present

## 2021-04-05 DIAGNOSIS — K8020 Calculus of gallbladder without cholecystitis without obstruction: Secondary | ICD-10-CM | POA: Diagnosis not present

## 2021-04-05 DIAGNOSIS — R310 Gross hematuria: Secondary | ICD-10-CM | POA: Diagnosis not present

## 2021-04-05 DIAGNOSIS — N2889 Other specified disorders of kidney and ureter: Secondary | ICD-10-CM | POA: Diagnosis not present

## 2021-04-05 DIAGNOSIS — C641 Malignant neoplasm of right kidney, except renal pelvis: Secondary | ICD-10-CM | POA: Diagnosis not present

## 2021-04-11 DIAGNOSIS — R310 Gross hematuria: Secondary | ICD-10-CM | POA: Diagnosis not present

## 2021-04-11 DIAGNOSIS — D49511 Neoplasm of unspecified behavior of right kidney: Secondary | ICD-10-CM | POA: Diagnosis not present

## 2021-04-16 ENCOUNTER — Encounter (HOSPITAL_COMMUNITY): Payer: Self-pay | Admitting: Radiology

## 2021-04-16 ENCOUNTER — Other Ambulatory Visit (HOSPITAL_COMMUNITY): Payer: Self-pay | Admitting: Urology

## 2021-04-16 DIAGNOSIS — N2889 Other specified disorders of kidney and ureter: Secondary | ICD-10-CM

## 2021-04-16 NOTE — Progress Notes (Signed)
Patient Name  Ian Cummings, Ian Cummings Legal Sex  Male DOB  [birth year] SSN  [id number] Address  [address] [place] [address] Phone  [phone] [facility]) *[phone] (Mobile)    RE: US Kidney Biopsy Received: Today Arne Cleveland, MD  [place], Eri Platten D Ok   [place] core R renal mass  [place] requesting   DDH        Previous Messages   ----- Message -----  From: Garth Bigness D  Sent: 04/16/2021  11:31 AM EST  To: Ir Procedure Requests  Subject: US Kidney Biopsy                               Procedure; US Kidney Biopsy   Reason:  right renal mass   History:  MR in computer   Provider:  Rexene Alberts   Provider Contact:  [phone]

## 2021-04-17 ENCOUNTER — Ambulatory Visit (HOSPITAL_BASED_OUTPATIENT_CLINIC_OR_DEPARTMENT_OTHER): Payer: Medicare HMO

## 2021-04-17 ENCOUNTER — Telehealth: Payer: Self-pay

## 2021-04-17 DIAGNOSIS — Z01810 Encounter for preprocedural cardiovascular examination: Secondary | ICD-10-CM

## 2021-04-17 NOTE — Telephone Encounter (Signed)
° °  Pre-operative Risk Assessment    Patient Name: Ian Cummings  DOB: 1928-09-26 MRN: [id number]      Request for Surgical Clearance    Procedure:   Renal Biopsy  Date of Surgery:  Clearance TBD                                 Surgeon:  Dr. Rexene Alberts Surgeon's Group or Practice Name:  [facility] Phone number:  [phone] Fax number:  [phone]   Type of Clearance Requested:   - Medical  - Pharmacy:  Hold Clopidogrel (Plavix) 5 days prior   Type of Anesthesia:   Unspecified   Additional requests/questions:  Please advise surgeon/provider what medications should be held.  Signed, Gita Kudo   04/17/2021, 11:04 AM

## 2021-04-17 NOTE — Telephone Encounter (Signed)
Patient Name: Ian Cummings DOB: 09-28-1928 MRN: [id number]  Primary Cardiologist: Dr. Geraldo Pitter  87 year old male with a history of MI/CAD s/p CABG/ACS 10/2019, disease not amenable to intervention on Plavix, chronic systolic heart failure (EF 25-30% in 01/2020), ischemic cardiomyopathy, hypertension, hyperlipidemia, AAA, paroxysmal atrial fibrillation on Xarelto, bradycardia/sinus node dysfunction s/p PPM, CVA, PVD, OSA, CKD stg 3a, and renal mass.   Last seen 03/19/2021 and was stable from cardiac standpoint. Repeat echo was ordered to reassess ICM/HFrEF, which has not yet been completed. BP was borderline elevated.   Received request for pre-op clearance for renal biopsy. Please advise on holding Plavix prior to procedure. Additionally, I will reach out to pharmacy for recommendations on holding Xarelto.   Thank you.  Lenna Sciara, NP

## 2021-04-18 NOTE — Telephone Encounter (Signed)
1st attempt to reach pt regarding needing and Echocardiogram before clearance can be given for his Biopsy, left a message for pt to call back.

## 2021-04-18 NOTE — Telephone Encounter (Addendum)
Patient with diagnosis of afib on Xarelto for anticoagulation.    Procedure: renal biopsy Date of procedure: TBD  CHA2DS2-VASc Score = 7  This indicates a 11.2% annual risk of stroke. The patient's score is based upon: CHF History: 1 HTN History: 1 Diabetes History: 0 Stroke History: 2 Vascular Disease History: 1 Age Score: 2 Gender Score: 0  CrCl 56mL/min Platelet count 195K  Per office protocol, patient can hold Xarelto for 2 days prior to procedure given CKD and higher bleed risk procedure. Resume as soon as safely possible given elevated CV risk.

## 2021-04-18 NOTE — Telephone Encounter (Signed)
Dr. Geraldo Pitter, we were waiting for echo prior to completing the cardiac evaluation for surgery. The patient cancelled his echo which was scheduled for 12/21 with reason given that pt does not want to reschedule at this time. Would you feel comfortable completing the evaluation for renal biopsy without the echo, or do you prefer we wait for the echo result?  Please direct your response to cv div preop pool.  Thank you, Christen Bame, NP-C

## 2021-04-18 NOTE — Telephone Encounter (Signed)
Per Dr. Bettina Gavia who is covering for Dr. Geraldo Pitter and states he also knows the patient very well, patient will need echo prior to scheduling renal biopsy. Please notify patient.

## 2021-04-19 ENCOUNTER — Telehealth: Payer: Self-pay | Admitting: Cardiology

## 2021-04-19 NOTE — Telephone Encounter (Signed)
Lvm letting patient know that he needs an echo prior to clearance.Order entered and  routed to [place] scheduling.

## 2021-04-19 NOTE — Telephone Encounter (Signed)
Pt returning phone call... please advise  

## 2021-04-19 NOTE — Telephone Encounter (Signed)
Left message on patients voicemail to please return our call.   

## 2021-04-19 NOTE — Addendum Note (Signed)
Addended by: Mendel Ryder on: 04/19/2021 12:01 PM   Modules accepted: Orders

## 2021-04-23 ENCOUNTER — Encounter: Payer: Self-pay | Admitting: Cardiology

## 2021-04-23 NOTE — Telephone Encounter (Signed)
Spoke with [place] for requesting office and make her aware that patient cannot be cleared until he has echo done. She stated that she will reach out to the patient.

## 2021-04-23 NOTE — Telephone Encounter (Signed)
Message left for to callback regarding his echo. Pt was scheduled for his echo 04/17/21 and cancelled and declined to reschedule.

## 2021-04-23 NOTE — Telephone Encounter (Signed)
Please call [facility] and notify them that we have had difficulty getting patient to schedule echo which is required for preop clearance.

## 2021-04-23 NOTE — Telephone Encounter (Signed)
[place] with [facility] is following up for an update on patient's clearance. Please return call to discuss when able.

## 2021-04-24 NOTE — Telephone Encounter (Signed)
Pt states that he has his echo scheduled with Dr. Claudie Revering office.

## 2021-04-25 ENCOUNTER — Telehealth: Payer: Self-pay | Admitting: Oncology

## 2021-04-25 DIAGNOSIS — E785 Hyperlipidemia, unspecified: Secondary | ICD-10-CM | POA: Diagnosis not present

## 2021-04-25 DIAGNOSIS — I10 Essential (primary) hypertension: Secondary | ICD-10-CM | POA: Diagnosis not present

## 2021-04-25 NOTE — Telephone Encounter (Signed)
Scheduled appt per 12/21 referral. Pt's wife is aware of appt date and time. She is also aware to have pt arrive 15 mins prior to appt time.

## 2021-04-25 NOTE — Telephone Encounter (Signed)
I will update the requesting office pt has cardiac testing, echo for pre op clearance 04/26/21. Once echo has been read and pt has been cleared our office will be sure to fax over clearance notes.

## 2021-04-26 ENCOUNTER — Ambulatory Visit (HOSPITAL_COMMUNITY): Payer: Medicare HMO | Attending: Internal Medicine

## 2021-04-26 ENCOUNTER — Other Ambulatory Visit: Payer: Self-pay

## 2021-04-26 ENCOUNTER — Encounter (HOSPITAL_COMMUNITY): Payer: Self-pay | Admitting: Radiology

## 2021-04-26 DIAGNOSIS — E785 Hyperlipidemia, unspecified: Secondary | ICD-10-CM | POA: Diagnosis not present

## 2021-04-26 DIAGNOSIS — I130 Hypertensive heart and chronic kidney disease with heart failure and stage 1 through stage 4 chronic kidney disease, or unspecified chronic kidney disease: Secondary | ICD-10-CM | POA: Diagnosis not present

## 2021-04-26 DIAGNOSIS — N1831 Chronic kidney disease, stage 3a: Secondary | ICD-10-CM | POA: Diagnosis not present

## 2021-04-26 DIAGNOSIS — Z01810 Encounter for preprocedural cardiovascular examination: Secondary | ICD-10-CM | POA: Diagnosis not present

## 2021-04-26 DIAGNOSIS — I5033 Acute on chronic diastolic (congestive) heart failure: Secondary | ICD-10-CM | POA: Diagnosis not present

## 2021-04-26 LAB — ECHOCARDIOGRAM COMPLETE
AV Mean grad: 11 mmHg
AV Peak grad: 18.6 mmHg
Ao pk vel: 2.16 m/s
Area-P 1/2: 5.42 cm2
S' Lateral: 4.8 cm

## 2021-04-26 NOTE — Progress Notes (Signed)
Patient Name  Ian Cummings, Ian Cummings Legal Sex  Male DOB  1928-08-20 SSN  [id number] Address  [address] [place] [address] Phone  [phone] [facility]) *Preferred*  [phone] (Mobile)    RE: US Kidney Biopsy Received: Malva Cogan, MD  Garth Bigness D; P Cv Div Preop Ok to hold for 5 days.  Thx,  CL        Previous Messages   ----- Message -----  From: Garth Bigness D  Sent: 04/16/2021  11:37 AM EST  To: Vickie Epley, MD  Subject: FW: US Kidney Biopsy                           Good morning, Dr. Quentin Ore, this patient is on plavix and will need to hold for 5 days prior to the biopsy that has been ordered. Please advise if okay to hold prior to biopsy, Thanks.  ----- Message -----  From: Garth Bigness D  Sent: 04/16/2021  11:31 AM EST  To: Ir Procedure Requests  Subject: US Kidney Biopsy                               Procedure; US Kidney Biopsy   Reason:  right renal mass   History:  MR in computer   Provider:  Rexene Alberts   Provider Contact:  [phone]

## 2021-04-26 NOTE — Telephone Encounter (Signed)
I will update pre op provider the pt is having echo today for pre op clearance.

## 2021-04-29 ENCOUNTER — Telehealth: Payer: Self-pay | Admitting: Cardiology

## 2021-04-29 NOTE — Telephone Encounter (Signed)
This is a bit of a complicated phone call  He was seen by one of my partners last visit he recommended that he have an echocardiogram before renal biopsy and was away for 2 weeks with a family wedding in [place].  I was contacted asking if the echocardiogram was needed I reviewed his notes and he said yes and I received the report of his echocardiogram.  Unfortunately he has severe left ventricular dysfunction and again is not a candidate for excision of his cancer.  He is no longer anticoagulated because of gross hematuria and is on clopidogrel.  Is scheduled for biopsy 05/14/2020 and I told him to take his last dose of clopidogrel 05/06/2020 and typically restart 48 to 72 hours afterwards when safe from a bleeding perspective.  He is unsure if he will receive cancer treatment and wants to meet with the oncologist.  His primary concern is his hip pain and is interested in palliation and hospice if needed to improve the quality of his life.  I reviewed with him the results of his echocardiogram he had already reviewed it online and has very good healthcare literacy and understood the impact of the findings.  Fortunately been able to palliate his cardiac disease with antiarrhythmic drug therapy treatment for heart failure antianginal medications and a pacemaker.  I will send a copy of this note to urology Dr. Abner Greenspan.

## 2021-05-02 NOTE — Telephone Encounter (Signed)
Covering preop today. Dr. Bettina Gavia Cc'd this chart to our team. Appears he has addressed question of clearance as below. Dr. Quentin Ore also cc'd a separate staff message replying to the Plavix question, contents of message copy/pasted below:  Vickie Epley, MD  Garth Bigness D; [place] to hold for 5 days.  Thx,  CL        Previous Messages   ----- Message -----  From: Garth Bigness D  Sent: 04/16/2021  11:37 AM EST  To: Vickie Epley, MD  Subject: FW: US Kidney Biopsy                           Good morning, Dr. Quentin Ore, this patient is on plavix and will need to hold for 5 days prior to the biopsy that has been ordered. Please advise if okay to hold prior to biopsy, Thanks.  ----- Message -----  From: Garth Bigness D  Sent: 04/16/2021  11:31 AM EST  To: [facility] Procedure Requests  Subject: US Kidney Biopsy                               Procedure; US Kidney Biopsy   Reason:  right renal mass   History:  MR in computer   Provider:  Rexene Alberts   Provider Contact:  [phone]     I will reply to the requesting party to review this updated note from Dr. Bettina Gavia.  No separate input needed from APP team at this time so will remove from preop box. Maddax Palinkas PA-C

## 2021-05-02 NOTE — Telephone Encounter (Signed)
Patient with diagnosis of afib on Xarelto for anticoagulation.    Procedure: US Kidney Biopsy Date of procedure: TBD  CHA2DS2-VASc Score = 7   This indicates a 11.2% annual risk of stroke. The patient's score is based upon: CHF History: 1 HTN History: 1 Diabetes History: 0 Stroke History: 2 Vascular Disease History: 1 Age Score: 2 Gender Score: 0      CrCl 32.3 ml/min  Per office protocol, patient can hold Xarelto for 1 days prior to procedure.   Xarelto should be resumed as soon as safely possible

## 2021-05-02 NOTE — Telephone Encounter (Signed)
Per reply:  Ian Cummings; Vickie Epley, MD I received this already but I just noticed that patient is also on Xarelto. He will need to stop that just one day prior. Will need an order for that also.    Will route to pharm for input then will reply to [facility] in this thread.

## 2021-05-02 NOTE — Telephone Encounter (Signed)
I spoke with patient who confirms he is actually no longer on Xarelto. As per Dr Joya Gaskins note, this was stopped due to gross hematuria so he is only on Plavix at this time. Will route back to [facility] as FYI. Will remove from preop box. Please call with questions. Thank you. Kymberlie Brazeau PA-C

## 2021-05-08 ENCOUNTER — Other Ambulatory Visit: Payer: Self-pay

## 2021-05-08 ENCOUNTER — Inpatient Hospital Stay: Payer: Medicare HMO | Attending: Oncology | Admitting: Oncology

## 2021-05-08 VITALS — BP 123/70 | HR 72 | Temp 97.8°F | Resp 17 | Ht 71.0 in | Wt 154.3 lb

## 2021-05-08 DIAGNOSIS — M549 Dorsalgia, unspecified: Secondary | ICD-10-CM | POA: Insufficient documentation

## 2021-05-08 DIAGNOSIS — Z833 Family history of diabetes mellitus: Secondary | ICD-10-CM

## 2021-05-08 DIAGNOSIS — M109 Gout, unspecified: Secondary | ICD-10-CM | POA: Diagnosis not present

## 2021-05-08 DIAGNOSIS — Z87891 Personal history of nicotine dependence: Secondary | ICD-10-CM | POA: Diagnosis not present

## 2021-05-08 DIAGNOSIS — E785 Hyperlipidemia, unspecified: Secondary | ICD-10-CM | POA: Diagnosis not present

## 2021-05-08 DIAGNOSIS — M48061 Spinal stenosis, lumbar region without neurogenic claudication: Secondary | ICD-10-CM | POA: Insufficient documentation

## 2021-05-08 DIAGNOSIS — C649 Malignant neoplasm of unspecified kidney, except renal pelvis: Secondary | ICD-10-CM | POA: Diagnosis not present

## 2021-05-08 DIAGNOSIS — K449 Diaphragmatic hernia without obstruction or gangrene: Secondary | ICD-10-CM | POA: Insufficient documentation

## 2021-05-08 DIAGNOSIS — Z8349 Family history of other endocrine, nutritional and metabolic diseases: Secondary | ICD-10-CM

## 2021-05-08 DIAGNOSIS — I10 Essential (primary) hypertension: Secondary | ICD-10-CM | POA: Diagnosis not present

## 2021-05-08 DIAGNOSIS — Z823 Family history of stroke: Secondary | ICD-10-CM

## 2021-05-08 DIAGNOSIS — Z79899 Other long term (current) drug therapy: Secondary | ICD-10-CM | POA: Insufficient documentation

## 2021-05-08 DIAGNOSIS — Z8249 Family history of ischemic heart disease and other diseases of the circulatory system: Secondary | ICD-10-CM

## 2021-05-08 DIAGNOSIS — Z125 Encounter for screening for malignant neoplasm of prostate: Secondary | ICD-10-CM | POA: Diagnosis not present

## 2021-05-08 DIAGNOSIS — C641 Malignant neoplasm of right kidney, except renal pelvis: Secondary | ICD-10-CM | POA: Insufficient documentation

## 2021-05-08 DIAGNOSIS — Z7289 Other problems related to lifestyle: Secondary | ICD-10-CM | POA: Diagnosis not present

## 2021-05-08 DIAGNOSIS — E039 Hypothyroidism, unspecified: Secondary | ICD-10-CM | POA: Diagnosis not present

## 2021-05-08 DIAGNOSIS — I509 Heart failure, unspecified: Secondary | ICD-10-CM | POA: Insufficient documentation

## 2021-05-08 DIAGNOSIS — R590 Localized enlarged lymph nodes: Secondary | ICD-10-CM | POA: Insufficient documentation

## 2021-05-08 DIAGNOSIS — I700 Atherosclerosis of aorta: Secondary | ICD-10-CM | POA: Insufficient documentation

## 2021-05-08 DIAGNOSIS — R7301 Impaired fasting glucose: Secondary | ICD-10-CM | POA: Diagnosis not present

## 2021-05-08 DIAGNOSIS — Z880 Allergy status to penicillin: Secondary | ICD-10-CM | POA: Insufficient documentation

## 2021-05-08 DIAGNOSIS — J849 Interstitial pulmonary disease, unspecified: Secondary | ICD-10-CM | POA: Insufficient documentation

## 2021-05-08 NOTE — Progress Notes (Signed)
Reason for the request:   Kidney cancer  HPI: I was asked by Dr. Abner Greenspan to evaluate Ian Cummings for the evaluation of kidney cancer.  He is a 88 year old man who was found to have renal mass in October 2021.  Evaluation at that time revealed a 6.9 cm of the right kidney that is compatible with renal neoplasm.  He was not felt to be a surgical candidate and was on observation at that time.  CT scan in July 2022 showed enlargement of his right kidney mass measuring 7.0 x 6.8 cm.  Imaging studies in December 2022 showed his mass measuring 7.6 x 7.2 cm but no omental and peritoneal soft tissue nodules consistent with metastatic disease.  He has also prominent subcentimeter retroperitoneal lymph nodes.    Clinically, he reports no major complaints at this time.  He has reported intermittent back pain although no neurological deficits.  He is still able to ambulate without any difficulties at this time.  He denies any neurological deficits.  He denies any hematuria or dysuria.  His performance status remains reasonable at this time.   He does not report any headaches, blurry vision, syncope or seizures. Does not report any fevers, chills or sweats.  Does not report any cough, wheezing or hemoptysis.  Does not report any chest pain, palpitation, orthopnea or leg edema.  Does not report any nausea, vomiting or abdominal pain.  Does not report any constipation or diarrhea.  Does not report any skeletal complaints.    Does not report frequency, urgency or hematuria.  Does not report any skin rashes or lesions. Does not report any heat or cold intolerance.  Does not report any lymphadenopathy or petechiae.  Does not report any anxiety or depression.  Remaining review of systems is negative.     Past Medical History:  Diagnosis Date   AAA (abdominal aortic aneurysm)    2004, Pt stated not there now   Anginal pain ([place])    Anxiety    Arthritis    Atherosclerosis of native arteries of the extremities with  intermittent claudication 12/04/2011   ABI .19 and .54 in 2012    Atherosclerotic PVD with intermittent claudication ([place]) 02/20/2014   BPH (benign prostatic hypertrophy)    Bradycardia 01/12/2019   Bradycardia on ECG 11/11/2017   CAD (coronary artery disease)    Carotid artery disease ([place])    Right carotid stent 2013   Carotid artery disease without cerebral infarction [facility])    S/p right carotid stent for asymptomatic disease  2013    Chronic diastolic (congestive) heart failure ([place]) 09/10/2019   Chronic kidney disease, stage 3a ([place]) 09/10/2019   Claudication ([place])    Colon polyps    adenomatous   Complex sleep apnea syndrome 11/11/2017   Conjunctival lesion 05/04/2014   Formatting of this note might be different from the original. left   Coronary artery disease    Coronary artery disease involving native coronary artery of native heart with unstable angina pectoris ([place]) 09/06/2012   CABG w LIMA to LAD, SVG to OM1-OM2, SVG to dx, SVG to PD/PL 1995,  Cypher stent x 2 SVG to RCA and Cypher stent to Circ Graft 05/13/07, stent of SVG to circ OM in [place] in July 2013  Cath 4/14 [facility] normal Left main, occluded mid LAD, occluded RCA SVG, occluded CFX, widely patent OM 1 SVG, occluded RCA SVG, occluded Diag 1 SVG, widely patent LAD LIMA graft;  Redo CABG Dr. Servando Snare  10/18/12 with  Depression    Essential hypertension 01/12/2019   GERD (gastroesophageal reflux disease)    Gout    H/O hiatal hernia    Hyperlipidemia    Hypertension    Hypertension, renal disease, stage 1-4 or unspecified chronic kidney disease    Hypothyroidism    Malnutrition of moderate degree 08/05/2015   MI (myocardial infarction) ([place]) 4/14   Mixed hyperlipidemia    Neuropathy    Hx: of in B/L toes   OSA on CPAP 02/17/2018   PAD (peripheral artery disease) ([place]) 08/03/2015   PAF (paroxysmal atrial fibrillation) ([place])    Peripheral vascular disease ([place])    with claudication   Pre-diabetes    RBBB 01/12/2019   s/p  CABG    Shoulder pain 01/27/2019   Stroke ([place])    TIA (transient ischemic attack)    Hx: of   Unstable angina ([place]) 09/10/2019  :   Past Surgical History:  Procedure Laterality Date   CARDIOVERSION N/A 02/22/2020   Procedure: CARDIOVERSION;  Surgeon: [name], MD;  Location: [facility];  Service: Cardiovascular;  Laterality: N/A;   CAROTID STENT INSERTION Right 2013   right at [place] EXTRACTION Bilateral    Hx: of both eyes   COLONOSCOPY     Hx: of   COLONOSCOPY     CORONARY ANGIOPLASTY WITH STENT PLACEMENT  2010   CORONARY ARTERY BYPASS GRAFT  1995   CORONARY ARTERY BYPASS GRAFT N/A 10/18/2012   Procedure: REDO CORONARY ARTERY BYPASS GRAFTING (CABG);  Surgeon: Grace Isaac, MD;  Location: [facility];  Service: Open Heart Surgery;  Laterality: N/A;  off pump times two using endoscopically harvested left saphenous vein   CORONARY STENT PLACEMENT  06/2016   ENDARTERECTOMY FEMORAL Right 08/03/2015   Procedure: RIGHT FEMORAL ENDARTERECTOMY WITH PATCH ANGIOPLASTY;  Surgeon: Serafina [place], MD;  Location: [facility];  Service: Vascular;  Laterality: Right;   FEMORAL-POPLITEAL BYPASS GRAFT Right 08/03/2015   Procedure: RIGHT FEMORAL-BELOW KNEE POPLITEAL ARTERY BYPASS GRAFT;  Surgeon: Serafina [place], MD;  Location: [facility];  Service: Vascular;  Laterality: Right;   FOOT SURGERY Left    FRACTURE SURGERY Left    Hx: of Left heel   INGUINAL HERNIA REPAIR Bilateral    X 2    INTRAOPERATIVE TRANSESOPHAGEAL ECHOCARDIOGRAM N/A 10/18/2012   Procedure: INTRAOPERATIVE TRANSESOPHAGEAL ECHOCARDIOGRAM;  Surgeon: Grace Isaac, MD;  Location: [facility];  Service: Open Heart Surgery;  Laterality: N/A;   LEFT HEART CATH AND CORS/GRAFTS ANGIOGRAPHY N/A 10/25/2019   Procedure: LEFT HEART CATH AND CORS/GRAFTS ANGIOGRAPHY;  Surgeon: Leonie Man, MD;  Location: [facility] CV LAB;  Service: Cardiovascular;  Laterality: N/A;   LEFT HEART CATHETERIZATION WITH CORONARY/GRAFT ANGIOGRAM  N/A 02/02/2013   Procedure: LEFT HEART CATHETERIZATION WITH Beatrix Fetters;  Surgeon: Jacolyn Reedy, MD;  Location: [facility] CATH LAB;  Service: Cardiovascular;  Laterality: N/A;   LOWER EXTREMITY ANGIOGRAM Right 06/27/2015   Procedure: Lower Extremity Angiogram;  Surgeon: Serafina [place], MD;  Location: [facility] CV LAB;  Service: Cardiovascular;  Laterality: Right;   MASTOIDECTOMY  1933   PACEMAKER IMPLANT N/A 05/21/2020   Procedure: PACEMAKER IMPLANT;  Surgeon: Vickie Epley, MD;  Location: [place] CV LAB;  Service: Cardiovascular;  Laterality: N/A;   PERIPHERAL VASCULAR CATHETERIZATION N/A 01/23/2015   Procedure: Abdominal Aortogram;  Surgeon: Serafina [place], MD;  Location: [facility] CV LAB;  Service: Cardiovascular;  Laterality: N/A;   PERIPHERAL VASCULAR CATHETERIZATION N/A 06/27/2015  Procedure: Abdominal Aortogram;  Surgeon: Serafina Mitchell, MD;  Location: [facility] CV LAB;  Service: Cardiovascular;  Laterality: N/A;   RETINAL DETACHMENT SURGERY Left    Hx: of left eye   TEE WITHOUT CARDIOVERSION N/A 02/22/2020   Procedure: TRANSESOPHAGEAL ECHOCARDIOGRAM (TEE);  Surgeon: [name], MD;  Location: [facility] ENDOSCOPY;  Service: Cardiovascular;  Laterality: N/A;   TONSILLECTOMY    :   Current Outpatient Medications:    allopurinol (ZYLOPRIM) 100 MG tablet, Take 100 mg by mouth daily. , Disp: , Rfl:    amiodarone (PACERONE) 200 MG tablet, Take 0.5 tablets (100 mg total) by mouth daily., Disp: 45 tablet, Rfl: 3   clopidogrel (PLAVIX) 75 MG tablet, Take 1 tablet (75 mg total) by mouth daily., Disp: , Rfl:    escitalopram (LEXAPRO) 10 MG tablet, Take 10 mg by mouth daily., Disp: , Rfl:    isosorbide mononitrate (IMDUR) 30 MG 24 hr tablet, Take 30 mg by mouth daily., Disp: , Rfl:    lansoprazole (PREVACID) 30 MG capsule, Take 30 mg by mouth daily., Disp: , Rfl:    levothyroxine (SYNTHROID) 100 MCG tablet, Take 1 tablet (100 mcg total) by mouth daily before breakfast.,  Disp: 90 tablet, Rfl: 3   multivitamin (RENA-VIT) TABS tablet, Take 1 tablet by mouth daily., Disp: , Rfl:    nitroGLYCERIN (NITROSTAT) 0.4 MG SL tablet, Place 0.4 mg under the tongue every 5 (five) minutes as needed for chest pain. , Disp: , Rfl:    Rivaroxaban (XARELTO) 15 MG TABS tablet, Take 1 tablet (15 mg total) by mouth daily with supper., Disp: 90 tablet, Rfl: 2   rosuvastatin (CRESTOR) 40 MG tablet, Take 40 mg by mouth every other day., Disp: , Rfl:    sacubitril-valsartan (ENTRESTO) 24-26 MG, Take 1 tablet by mouth 2 (two) times daily., Disp: 180 tablet, Rfl: 3:   Allergies  Allergen Reactions   Atorvastatin     Other reaction(s): myalgia   Amoxicillin Rash   Metoprolol Itching    When out in the sun, pt is currently taking.  :   Family History  Problem Relation Age of Onset   Heart disease Mother        After 13 yrs of age   Heart attack Mother    Hyperlipidemia Father    Hypertension Father    Heart disease Father        After 21 yrs of age   Diabetes Father    Stroke Sister    Colon cancer Neg Hx    Stomach cancer Neg Hx    Rectal cancer Neg Hx    Esophageal cancer Neg Hx    Liver disease Neg Hx    Kidney disease Neg Hx   :   Social History   Socioeconomic History   Marital status: Married    Spouse name: Not on file   Number of children: 3   Years of education: Not on file   Highest education level: Not on file  Occupational History   Occupation: retired  Tobacco Use   Smoking status: Former    Types: Cigarettes    Quit date: 04/29/1975    Years since quitting: 46.0   Smokeless tobacco: Never  Vaping Use   Vaping Use: Never used  Substance and Sexual Activity   Alcohol use: Yes    Alcohol/week: 3.0 standard drinks    Types: 3 Glasses of wine per week    Comment: 3 glasses of wine today   Drug  use: No   Sexual activity: Not Currently  Other Topics Concern   Not on file  Social History Narrative   Retired [place] Strain: Not on [employer] Insecurity: Not on file  Transportation Needs: Not on file  Physical Activity: Not on file  Stress: Not on file  Social Connections: Not on file  Intimate Partner Violence: Not on file  :  Pertinent items are noted in HPI.  Exam: Blood pressure 123/70, pulse 72, temperature 97.8 F (36.6 C), temperature source Temporal, resp. rate 17, height 5\' 11"  (1.803 m), weight 154 lb 4.8 oz (70 kg), SpO2 98 %. ECOG 1 General appearance: alert and cooperative appeared without distress. Head: atraumatic without any abnormalities. Eyes: conjunctivae/corneas clear. PERRL.  Sclera anicteric. Throat: lips, mucosa, and tongue normal; without oral thrush or ulcers. Resp: clear to auscultation bilaterally without rhonchi, wheezes or dullness to percussion. Cardio: regular rate and rhythm, S1, S2 normal, no murmur, click, rub or gallop GI: soft, non-tender; bowel sounds normal; no masses,  no organomegaly Skin: Skin color, texture, turgor normal. No rashes or lesions Lymph nodes: Cervical, supraclavicular, and axillary nodes normal. Neurologic: Grossly normal without any motor, sensory or deep tendon reflexes. Musculoskeletal: No joint deformity or effusion.   ECHOCARDIOGRAM COMPLETE  Result Date: 04/26/2021    ECHOCARDIOGRAM REPORT   Patient Name:   Ian Cummings Date of Exam: 04/26/2021 Medical Rec #:  [id number]     Height:       71.0 in Accession #:    [id number]    Weight:       154.1 lb Date of Birth:  1928-08-09    BSA:          1.887 m Patient Age:    63 years      BP:           110/52 mmHg Patient Gender: M             HR:           71 bpm. Exam Location:  [facility] Procedure: 2D Echo, Cardiac Doppler and Color Doppler Indications:    Z01.810 Pre-op exam  History:        Patient has prior history of Echocardiogram examinations, most                 recent 09/10/2019. Prior CABG and Pacemaker, Pre-op exam,                  Arrythmias:RBBB and Atrial Fibrillation; Risk                 Factors:Hypertension, Dyslipidemia and Former Smoker.  Sonographer:    Coralyn Helling RDCS Referring Phys: [id number] [place]  1. Compared with the echo 08/4006, systolic function is worse. Left ventricular ejection fraction, by estimation, is 25 to 30%. The left ventricle has severely decreased function. The left ventricle demonstrates global hypokinesis. The left ventricular internal cavity size was mildly dilated. There is mild concentric left ventricular hypertrophy. Left ventricular diastolic parameters are consistent with Grade II diastolic dysfunction (pseudonormalization). Elevated left atrial pressure.  2. Right ventricular systolic function is normal. The right ventricular size is normal. There is normal pulmonary artery systolic pressure.  3. Left atrial size was severely dilated.  4. Right atrial size was severely dilated.  5. The mitral valve is normal in structure. Mild mitral valve regurgitation. No evidence of mitral stenosis.  6. Tricuspid valve regurgitation is mild to moderate.  7. Aortic valve peak velocity and mean gradient are consistent iwth mild aortic stenosis. However, the dimensionless index is 0.35, consistent with moderate aortic stenosis. . The aortic valve is normal in structure. There is moderate calcification of the aortic valve. There is moderate thickening of the aortic valve. Aortic valve regurgitation is not visualized. Moderate aortic valve stenosis. Aortic valve mean gradient measures 11.0 mmHg. Aortic valve Vmax measures 2.16 m/s.  8. The inferior vena cava is normal in size with greater than 50% respiratory variability, suggesting right atrial pressure of 3 mmHg. FINDINGS  Left Ventricle: Compared with the echo 07/2351, systolic function is worse. Left ventricular ejection fraction, by estimation, is 25 to 30%. The left ventricle has severely decreased function. The left ventricle demonstrates global  hypokinesis. The left ventricular internal cavity size was mildly dilated. There is mild concentric left ventricular hypertrophy. Left ventricular diastolic parameters are consistent with Grade II diastolic dysfunction (pseudonormalization). Elevated left atrial pressure. Right Ventricle: The right ventricular size is normal. No increase in right ventricular wall thickness. Right ventricular systolic function is normal. There is normal pulmonary artery systolic pressure. The tricuspid regurgitant velocity is 2.73 m/s, and  with an assumed right atrial pressure of 3 mmHg, the estimated right ventricular systolic pressure is 61.4 mmHg. Left Atrium: Left atrial size was severely dilated. Right Atrium: Right atrial size was severely dilated. Pericardium: Trivial pericardial effusion is present. Mitral Valve: The mitral valve is normal in structure. Mild mitral valve regurgitation. No evidence of mitral valve stenosis. Tricuspid Valve: The tricuspid valve is normal in structure. Tricuspid valve regurgitation is mild to moderate. No evidence of tricuspid stenosis. Aortic Valve: Aortic valve peak velocity and mean gradient are consistent iwth mild aortic stenosis. However, the dimensionless index is 0.35, consistent with moderate aortic stenosis. The aortic valve is normal in structure. There is moderate calcification of the aortic valve. There is moderate thickening of the aortic valve. Aortic valve regurgitation is not visualized. Moderate aortic stenosis is present. Aortic valve mean gradient measures 11.0 mmHg. Aortic valve peak gradient measures 18.6 mmHg. Pulmonic Valve: The pulmonic valve was normal in structure. Pulmonic valve regurgitation is trivial. No evidence of pulmonic stenosis. Aorta: The aortic root is normal in size and structure. Venous: The inferior vena cava is normal in size with greater than 50% respiratory variability, suggesting right atrial pressure of 3 mmHg. IAS/Shunts: No atrial level shunt  detected by color flow Doppler. Additional Comments: A device lead is visualized.  LEFT VENTRICLE PLAX 2D LVIDd:         5.70 cm Diastology LVIDs:         4.80 cm LV e' medial:   8.05 cm/s LV PW:         1.10 cm LV E/e' medial: 18.4 LV IVS:        1.30 cm  RIGHT VENTRICLE            IVC TAPSE (M-mode): 0.9 cm     IVC diam: 1.60 cm RVSP:           32.8 mmHg LEFT ATRIUM              Index        RIGHT ATRIUM           Index LA diam:        5.70 cm  3.02 cm/m   RA Pressure: 3.00 mmHg LA Vol (A2C):   107.0 ml 56.69 ml/m  RA Area:     23.60 cm LA Vol (A4C):   108.0 ml 57.22 ml/m  RA Volume:   75.10 ml  39.79 ml/m LA Biplane Vol: 114.0 ml 60.40 ml/m  AORTIC VALVE AV Vmax:           215.50 cm/s AV Vmean:          156.500 cm/s AV VTI:            0.399 m AV Peak Grad:      18.6 mmHg AV Mean Grad:      11.0 mmHg LVOT Vmax:         81.10 cm/s LVOT Vmean:        55.300 cm/s LVOT VTI:          0.139 m LVOT/AV VTI ratio: 0.35  AORTA Ao Asc diam: 3.20 cm MITRAL VALVE                TRICUSPID VALVE MV Area (PHT): 5.42 cm     TR Peak grad:   29.8 mmHg MV Decel Time: 140 msec     TR Vmax:        273.00 cm/s MV E velocity: 148.00 cm/s  Estimated RAP:  3.00 mmHg                             RVSP:           32.8 mmHg                              SHUNTS                             Systemic VTI: 0.14 m [name] MD Electronically signed by [name] MD Signature Date/Time: 04/26/2021/2:56:50 PM    Final     Assessment and Plan:   57 year old with:  1.  Kidney mass diagnosed in 2021.  He was found to have a large right kidney tumor measuring 5.1 x 6.3 x 5.5 that grew to 7.6 and 7.2 centimeters.  He was found to have lymphadenopathy and retroperitoneal involvement indicating advanced disease.    Treatment options moving forward were discussed at this time and differential diagnosis were reviewed today.  Metastatic renal cell carcinoma remains the most likely etiology at this time.  Clear-cell versus other  histologies need to be determined before any attempt to treat at this condition.  Obtaining tissue biopsy would be the first step in this particular setting and subsequent to that we will discuss treatment choices.  He will also need to update his imaging studies of the chest.  Treatment options for advanced kidney cancer depending on the histology will include oral targeted therapy, immunotherapy single agent or in combination or combining immunotherapy and oral targeted therapy.  Complication associated with this treatment were reviewed at this time.  Alternative approach would be to transition into supportive care only and potentially hospice.  Systemic treatment at this time would offer only palliation and would not be curative.  After discussion today, he will consider these options and let me know in the near future.  We will complete his staging scan and his biopsy has already been arranged.  He understands that treatment would be palliative and his performance status is reasonable to consider aggressive measures.  2.  Follow-up: will be in the near future  to discuss the results of his staging and his final decision about treatment.   60  minutes were dedicated to this visit. The time was spent on reviewing  imaging studies, discussing treatment options, discussing differential diagnosis and answering questions regarding future plan.     A copy of this consult has been forwarded to the requesting physician.

## 2021-05-09 ENCOUNTER — Encounter: Payer: Self-pay | Admitting: Cardiology

## 2021-05-09 DIAGNOSIS — R5383 Other fatigue: Secondary | ICD-10-CM | POA: Diagnosis not present

## 2021-05-09 DIAGNOSIS — Z1152 Encounter for screening for COVID-19: Secondary | ICD-10-CM | POA: Diagnosis not present

## 2021-05-09 DIAGNOSIS — I5022 Chronic systolic (congestive) heart failure: Secondary | ICD-10-CM | POA: Diagnosis not present

## 2021-05-09 DIAGNOSIS — I10 Essential (primary) hypertension: Secondary | ICD-10-CM | POA: Diagnosis not present

## 2021-05-09 DIAGNOSIS — R531 Weakness: Secondary | ICD-10-CM | POA: Diagnosis not present

## 2021-05-09 DIAGNOSIS — I480 Paroxysmal atrial fibrillation: Secondary | ICD-10-CM | POA: Diagnosis not present

## 2021-05-09 DIAGNOSIS — I5033 Acute on chronic diastolic (congestive) heart failure: Secondary | ICD-10-CM | POA: Diagnosis not present

## 2021-05-09 DIAGNOSIS — R0602 Shortness of breath: Secondary | ICD-10-CM | POA: Diagnosis not present

## 2021-05-10 ENCOUNTER — Telehealth: Payer: Self-pay

## 2021-05-10 ENCOUNTER — Telehealth: Payer: Self-pay | Admitting: Cardiology

## 2021-05-10 NOTE — Telephone Encounter (Signed)
Anderson Malta from [facility] states the patient came in today to see them for dyspnea, cough, and SOB. She says he had a chest xray and he is having fluid overload with suspicion of infection. She says they adjusted his medications, but would like Dr. Bettina Gavia to evaluate his CHF. Patient is currently scheduled in March and I did not see anything sooner. She says the call back can go to the patient.

## 2021-05-10 NOTE — Telephone Encounter (Signed)
NOTES SCANNED TO REFERRAL 

## 2021-05-10 NOTE — Telephone Encounter (Addendum)
Anderson Malta from [facility] called, she wanted to clarify that it's low suspicion of URI and pneumonia and he is negative for flu and COVID.

## 2021-05-13 ENCOUNTER — Ambulatory Visit: Payer: Medicare HMO | Admitting: Cardiology

## 2021-05-13 ENCOUNTER — Other Ambulatory Visit: Payer: Self-pay

## 2021-05-13 ENCOUNTER — Encounter: Payer: Self-pay | Admitting: Cardiology

## 2021-05-13 VITALS — BP 112/60 | HR 64 | Ht 70.0 in | Wt 153.0 lb

## 2021-05-13 DIAGNOSIS — Z950 Presence of cardiac pacemaker: Secondary | ICD-10-CM | POA: Diagnosis not present

## 2021-05-13 DIAGNOSIS — Z951 Presence of aortocoronary bypass graft: Secondary | ICD-10-CM | POA: Diagnosis not present

## 2021-05-13 DIAGNOSIS — I480 Paroxysmal atrial fibrillation: Secondary | ICD-10-CM

## 2021-05-13 DIAGNOSIS — I059 Rheumatic mitral valve disease, unspecified: Secondary | ICD-10-CM | POA: Diagnosis not present

## 2021-05-13 DIAGNOSIS — I5032 Chronic diastolic (congestive) heart failure: Secondary | ICD-10-CM

## 2021-05-13 DIAGNOSIS — I10 Essential (primary) hypertension: Secondary | ICD-10-CM

## 2021-05-13 NOTE — Progress Notes (Signed)
Cardiology Office Note:    Date:  05/13/2021   ID:  Ian Cummings, DOB 1929-03-18, MRN [id number]  PCP:  Ginger Organ., MD  Cardiologist:  Jenne Campus, MD    Referring MD: Ginger Organ., MD   Chief Complaint  Patient presents with   Dizziness   Fatigue   Cough    History of Present Illness:    Ian Cummings is a 88 y.o. male with past medical history significant for coronary artery disease.  He does have remote coronary artery bypass graft he did have acute coronary syndrome in July 2021 with both grafts occluded and severe native CAD not amendable for any future revascularization, paroxysmal atrial fibrillation maintaining sinus rhythm on amiodarone, symptomatic bradycardia with sinus pauses required permanent pacemaker, he is chronically anticoagulated, hypertensive heart disease, cardiomyopathy with ejection fraction 25 to 30%, chronic kidney failure.  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 He requested to be seen because of for few months almost he has been coughing a lot also having some shortness of breath.  He is up going to the emergency he was given diuretic with great improvement however lately things started getting problematic and again.  He did not notice any swelling of lower extremities, he like to sleep in the recliner.  He does have fatigue tiredness and does not have any energy.  Denies have any fever or chills.  He described to have sputum with some yellowish coloration.  Recently he saw his primary care physician was given steroids as well as antibiotic vitals I think is a good news.  Denies have any chest pain tightness squeezing pressure burning chest.  Past Medical History:  Diagnosis Date   AAA (abdominal aortic aneurysm)    2004, Pt stated not there now   Anginal pain ([place])    Anxiety    Arthritis    Atherosclerosis of native arteries of the extremities with intermittent claudication 12/04/2011   ABI .38 and .54 in  2012    Atherosclerotic PVD with intermittent claudication ([place]) 02/20/2014   BPH (benign prostatic hypertrophy)    Bradycardia 01/12/2019   Bradycardia on ECG 11/11/2017   CAD (coronary artery disease)    Carotid artery disease ([place])    Right carotid stent 2013   Carotid artery disease without cerebral infarction [facility])    S/p right carotid stent for asymptomatic disease  2013    Chronic diastolic (congestive) heart failure ([place]) 09/10/2019   Chronic kidney disease, stage 3a ([place]) 09/10/2019   Claudication ([place])    Colon polyps    adenomatous   Complex sleep apnea syndrome 11/11/2017   Conjunctival lesion 05/04/2014   Formatting of this note might be different from the original. left   Coronary artery disease    Coronary artery disease involving native coronary artery of native heart with unstable angina pectoris ([place]) 09/06/2012   CABG w LIMA to LAD, SVG to OM1-OM2, SVG to dx, SVG to PD/PL 1995,  Cypher stent x 2 SVG to RCA and Cypher stent to Circ Graft 05/13/07, stent of SVG to circ OM in [place] in July 2013  Cath 4/14 [facility] normal Left main, occluded mid LAD, occluded RCA SVG, occluded CFX, widely patent OM 1 SVG, occluded RCA SVG, occluded Diag 1 SVG, widely patent LAD LIMA graft;  Redo CABG Dr. Servando Snare  10/18/12 with    Depression    Essential hypertension 01/12/2019   GERD (gastroesophageal reflux disease)  Gout    H/O hiatal hernia    Hyperlipidemia    Hypertension    Hypertension, renal disease, stage 1-4 or unspecified chronic kidney disease    Hypothyroidism    Malnutrition of moderate degree 08/05/2015   MI (myocardial infarction) ([place]) 4/14   Mixed hyperlipidemia    Neuropathy    Hx: of in B/L toes   OSA on CPAP 02/17/2018   PAD (peripheral artery disease) ([place]) 08/03/2015   PAF (paroxysmal atrial fibrillation) ([place])    Peripheral vascular disease ([place])    with claudication   Pre-diabetes    RBBB 01/12/2019   s/p CABG    Shoulder pain 01/27/2019   Stroke ([place])    TIA  (transient ischemic attack)    Hx: of   Unstable angina ([address]) 09/10/2019    Past Surgical History:  Procedure Laterality Date   CARDIOVERSION N/A 02/22/2020   Procedure: CARDIOVERSION;  Surgeon: [name], MD;  Location: [place];  Service: Cardiovascular;  Laterality: N/A;   CAROTID STENT INSERTION Right 2013   right at [place] EXTRACTION Bilateral    Hx: of both eyes   COLONOSCOPY     Hx: of   COLONOSCOPY     CORONARY ANGIOPLASTY WITH STENT PLACEMENT  2010   CORONARY ARTERY BYPASS GRAFT  1995   CORONARY ARTERY BYPASS GRAFT N/A 10/18/2012   Procedure: REDO CORONARY ARTERY BYPASS GRAFTING (CABG);  Surgeon: Grace Isaac, MD;  Location: [place];  Service: Open Heart Surgery;  Laterality: N/A;  off pump times two using endoscopically harvested left saphenous vein   CORONARY STENT PLACEMENT  06/2016   ENDARTERECTOMY FEMORAL Right 08/03/2015   Procedure: RIGHT FEMORAL ENDARTERECTOMY WITH PATCH ANGIOPLASTY;  Surgeon: Serafina Mitchell, MD;  Location: [place];  Service: Vascular;  Laterality: Right;   FEMORAL-POPLITEAL BYPASS GRAFT Right 08/03/2015   Procedure: RIGHT FEMORAL-BELOW KNEE POPLITEAL ARTERY BYPASS GRAFT;  Surgeon: Serafina Mitchell, MD;  Location: [place];  Service: Vascular;  Laterality: Right;   FOOT SURGERY Left    FRACTURE SURGERY Left    Hx: of Left heel   INGUINAL HERNIA REPAIR Bilateral    X 2    INTRAOPERATIVE TRANSESOPHAGEAL ECHOCARDIOGRAM N/A 10/18/2012   Procedure: INTRAOPERATIVE TRANSESOPHAGEAL ECHOCARDIOGRAM;  Surgeon: Grace Isaac, MD;  Location: [place];  Service: Open Heart Surgery;  Laterality: N/A;   LEFT HEART CATH AND CORS/GRAFTS ANGIOGRAPHY N/A 10/25/2019   Procedure: LEFT HEART CATH AND CORS/GRAFTS ANGIOGRAPHY;  Surgeon: Leonie Man, MD;  Location: [place] CV LAB;  Service: Cardiovascular;  Laterality: N/A;   LEFT HEART CATHETERIZATION WITH CORONARY/GRAFT ANGIOGRAM N/A 02/02/2013   Procedure: LEFT HEART CATHETERIZATION WITH  Beatrix Fetters;  Surgeon: Jacolyn Reedy, MD;  Location: [facility] CATH LAB;  Service: Cardiovascular;  Laterality: N/A;   LOWER EXTREMITY ANGIOGRAM Right 06/27/2015   Procedure: Lower Extremity Angiogram;  Surgeon: Serafina Mitchell, MD;  Location: [place] CV LAB;  Service: Cardiovascular;  Laterality: Right;   MASTOIDECTOMY  1933   PACEMAKER IMPLANT N/A 05/21/2020   Procedure: PACEMAKER IMPLANT;  Surgeon: Vickie Epley, MD;  Location: [place] CV LAB;  Service: Cardiovascular;  Laterality: N/A;   PERIPHERAL VASCULAR CATHETERIZATION N/A 01/23/2015   Procedure: Abdominal Aortogram;  Surgeon: Serafina Mitchell, MD;  Location: [place] CV LAB;  Service: Cardiovascular;  Laterality: N/A;   PERIPHERAL VASCULAR CATHETERIZATION N/A 06/27/2015   Procedure: Abdominal Aortogram;  Surgeon: Serafina Mitchell, MD;  Location: [place] CV LAB;  Service:  Cardiovascular;  Laterality: N/A;   RETINAL DETACHMENT SURGERY Left    Hx: of left eye   TEE WITHOUT CARDIOVERSION N/A 02/22/2020   Procedure: TRANSESOPHAGEAL ECHOCARDIOGRAM (TEE);  Surgeon: [name], MD;  Location: [facility] ENDOSCOPY;  Service: Cardiovascular;  Laterality: N/A;   TONSILLECTOMY      Current Medications: Current Meds  Medication Sig   acetaminophen (TYLENOL) 500 MG tablet Take 1,000 mg by mouth every 6 (six) hours as needed for moderate pain.   allopurinol (ZYLOPRIM) 100 MG tablet Take 100 mg by mouth daily.    amiodarone (PACERONE) 200 MG tablet Take 0.5 tablets (100 mg total) by mouth daily.   doxycycline (VIBRAMYCIN) 100 MG capsule Take 1 capsule by mouth in the morning and at bedtime.   escitalopram (LEXAPRO) 10 MG tablet Take 10 mg by mouth daily.   furosemide (LASIX) 40 MG tablet Take 40 mg by mouth daily.   lansoprazole (PREVACID) 30 MG capsule Take 30 mg by mouth daily.   levothyroxine (SYNTHROID) 112 MCG tablet Take 112 mcg by mouth daily before breakfast.   MELATONIN PO Take 1 tablet by mouth at bedtime as needed  (sleep). Unknown strength   Multiple Vitamin (MULTIVITAMIN WITH MINERALS) TABS tablet Take 1 tablet by mouth daily. Unknown strength   nitroGLYCERIN (NITROSTAT) 0.4 MG SL tablet Place 0.4 mg under the tongue every 5 (five) minutes as needed for chest pain.    potassium chloride (KLOR-CON) 10 MEQ tablet Take 10 mEq by mouth daily.   predniSONE (DELTASONE) 10 MG tablet Take 1 tablet by mouth in the morning and at bedtime. For 5 days starting 05/13/2020   rosuvastatin (CRESTOR) 40 MG tablet Take 40 mg by mouth every other day.   sacubitril-valsartan (ENTRESTO) 24-26 MG Take 1 tablet by mouth 2 (two) times daily.   tamsulosin (FLOMAX) 0.4 MG CAPS capsule Take 0.4 mg by mouth.     Allergies:   Atorvastatin, Xarelto [rivaroxaban], Amoxicillin, and Metoprolol   Social History   Socioeconomic History   Marital status: Married    Spouse name: Not on file   Number of children: 3   Years of education: Not on file   Highest education level: Not on file  Occupational History   Occupation: retired  Tobacco Use   Smoking status: Former    Types: Cigarettes    Quit date: 04/29/1975    Years since quitting: 46.0   Smokeless tobacco: Never  Vaping Use   Vaping Use: Never used  Substance and Sexual Activity   Alcohol use: Yes    Alcohol/week: 3.0 standard drinks    Types: 3 Glasses of wine per week    Comment: 3 glasses of wine today   Drug use: No   Sexual activity: Not Currently  Other Topics Concern   Not on file  Social History Narrative   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 There is no history of Colon cancer, Stomach cancer, Rectal cancer, Esophageal cancer, Liver disease, or Kidney disease. ROS:   Please see the history of present  illness.    All 14 point review of systems negative except as described per history of present illness  EKGs/Labs/Other Studies Reviewed:      Recent Labs: 07/25/2020: NT-Pro BNP 775 03/05/2021: ALT 45; BUN 26; Creatinine, Ser 1.50; Hemoglobin WILL FOLLOW; Platelets WILL FOLLOW; Potassium 3.8; Sodium 137; TSH 10.000  Recent Lipid Panel    Component Value Date/Time   CHOL 121 01/23/2020 1040   TRIG 191 (H) 01/23/2020 1040   HDL 30 (L) 01/23/2020 1040   CHOLHDL 4.0 01/23/2020 1040   CHOLHDL 3.2 04/18/2009 0241   VLDL 23 04/18/2009 0241   LDLCALC 59 01/23/2020 1040    Physical Exam:    VS:  BP 112/60 (BP Location: Left Arm, Patient Position: Sitting)    Pulse 64    Ht 5\' 10"  (1.778 m)    Wt 153 lb (69.4 kg)    SpO2 95%    BMI 21.95 kg/m     Wt Readings from Last 3 Encounters:  05/13/21 153 lb (69.4 kg)  05/08/21 154 lb 4.8 oz (70 kg)  03/19/21 154 lb 1.3 oz (69.9 kg)     GEN:  Well nourished, well developed in no acute distress HEENT: Normal NECK: No JVD; No carotid bruits LYMPHATICS: No lymphadenopathy CARDIAC: RRR, no murmurs, no rubs, no gallops RESPIRATORY:  Clear to auscultation without rales, wheezing or rhonchi, few crackles both bases ABDOMEN: Soft, non-tender, non-distended MUSCULOSKELETAL:  No edema; No deformity  SKIN: Warm and dry LOWER EXTREMITIES: no swelling NEUROLOGIC:  Alert and oriented x 3 PSYCHIATRIC:  Normal affect   ASSESSMENT:    1. Chronic diastolic (congestive) heart failure ([place])   2. Essential hypertension   3. PAF (paroxysmal atrial fibrillation) ([place])   4. Mitral valve disorder   5. s/p CABG   6. Pacemaker    PLAN:    In order of problems listed above:  Chronic systolic and diastolic congestive heart failure.  On the physical exam I can  hear few crackles at both bases.  About 2 days ago he doubled the dose of diuretic he takes 40 mg of furosemide twice daily.  Still takes only 10 mg of potassium.  However on physical exam look quite compensated except for few crackles both bases.  I will ask him to have proBNP check as well as Chem-7 based on that we will decide what to do which I anticipate continuation of high-dose of diuretic for a little longer.  I will also check his proBNP trend to determine what his symptomatology is coming from.  He does have amiodarone that he takes 200 mg daily.  He is scheduled to have CT of the chest will wait for results of it.  He has been on amiodarone for a while already relatively small dose we may be forced to discontinue this medication if there will be some fibrotic changes noted on the chest x-ray/CT. Paroxysmal atrial fibrillation he is maintaining sinus rhythm, he is not fully anticoagulated he is only on Plavix. Mitral valve disorder which is mitral regurgitation.  Noted, chronic. Pacemaker present normal function.  This is a Biotronik device. Overall he is a sick gentleman I am not sure what part of symptomatology is truly related to his congestive heart failure.  Concern is about potentially having complication of amiodarone which we may be forced to discontinue.  He is being put on steroids right now he is awaiting CT.   Medication Adjustments/Labs and Tests Ordered: Current medicines are reviewed at length with the patient  today.  Concerns regarding medicines are outlined above.  No orders of the defined types were placed in this encounter.  Medication changes: No orders of the defined types were placed in this encounter.   Signed, Park Liter, MD, [facility] 05/13/2021 10:31 AM    [place]

## 2021-05-13 NOTE — Patient Instructions (Signed)
Medication Instructions:  Your physician recommends that you continue on your current medications as directed. Please refer to the Current Medication list given to you today.  *If you need a refill on your cardiac medications before your next appointment, please call your pharmacy*   Lab Work: Your physician recommends that you return for lab work in: Labs today: BMP, Pro BNP If you have labs (blood work) drawn today and your tests are completely normal, you will receive your results only by: MyChart Message (if you have MyChart) OR A paper copy in the mail If you have any lab test that is abnormal or we need to change your treatment, we will call you to review the results.   Testing/Procedures: None   Follow-Up: At [facility], you and your health needs are our priority.  As part of our continuing mission to provide you with exceptional heart care, we have created designated Provider Care Teams.  These Care Teams include your primary Cardiologist (physician) and Advanced Practice Providers (APPs -  Physician Assistants and Nurse Practitioners) who all work together to provide you with the care you need, when you need it.  We recommend signing up for the patient portal called "MyChart".  Sign up information is provided on this After Visit Summary.  MyChart is used to connect with patients for Virtual Visits (Telemedicine).  Patients are able to view lab/test results, encounter notes, upcoming appointments, etc.  Non-urgent messages can be sent to your provider as well.   To learn more about what you can do with MyChart, go to [email or web address].    Your next appointment:   3 week(s)  The format for your next appointment:   In Person  Provider:   Jenne Campus, MD    Other Instructions None

## 2021-05-14 ENCOUNTER — Ambulatory Visit (HOSPITAL_COMMUNITY)
Admission: RE | Admit: 2021-05-14 | Discharge: 2021-05-14 | Disposition: A | Discharge status: Home / Self Care | Payer: Medicare HMO | Source: Ambulatory Visit | Attending: Oncology | Admitting: Oncology

## 2021-05-14 DIAGNOSIS — K449 Diaphragmatic hernia without obstruction or gangrene: Secondary | ICD-10-CM | POA: Insufficient documentation

## 2021-05-14 DIAGNOSIS — C649 Malignant neoplasm of unspecified kidney, except renal pelvis: Secondary | ICD-10-CM | POA: Insufficient documentation

## 2021-05-14 DIAGNOSIS — I700 Atherosclerosis of aorta: Secondary | ICD-10-CM | POA: Diagnosis not present

## 2021-05-14 DIAGNOSIS — J849 Interstitial pulmonary disease, unspecified: Secondary | ICD-10-CM | POA: Diagnosis not present

## 2021-05-14 DIAGNOSIS — I509 Heart failure, unspecified: Secondary | ICD-10-CM | POA: Diagnosis not present

## 2021-05-14 DIAGNOSIS — R590 Localized enlarged lymph nodes: Secondary | ICD-10-CM | POA: Diagnosis not present

## 2021-05-14 DIAGNOSIS — R911 Solitary pulmonary nodule: Secondary | ICD-10-CM | POA: Diagnosis not present

## 2021-05-14 DIAGNOSIS — R918 Other nonspecific abnormal finding of lung field: Secondary | ICD-10-CM | POA: Diagnosis not present

## 2021-05-14 LAB — PRO B NATRIURETIC PEPTIDE: NT-Pro BNP: 3457 pg/mL — ABNORMAL HIGH (ref 0–486)

## 2021-05-14 LAB — BASIC METABOLIC PANEL
BUN/Creatinine Ratio: 16 (ref 10–24)
BUN: 22 mg/dL (ref 10–36)
CO2: 22 mmol/L (ref 20–29)
Calcium: 9.5 mg/dL (ref 8.6–10.2)
Chloride: 101 mmol/L (ref 96–106)
Creatinine, Ser: 1.4 mg/dL — ABNORMAL HIGH (ref 0.76–1.27)
Glucose: 130 mg/dL — ABNORMAL HIGH (ref 70–99)
Potassium: 3.9 mmol/L (ref 3.5–5.2)
Sodium: 141 mmol/L (ref 134–144)
eGFR: 48 mL/min/{1.73_m2} — ABNORMAL LOW (ref >59)

## 2021-05-15 ENCOUNTER — Telehealth: Payer: Self-pay | Admitting: *Deleted

## 2021-05-15 ENCOUNTER — Other Ambulatory Visit: Payer: Self-pay | Admitting: Internal Medicine

## 2021-05-15 DIAGNOSIS — E039 Hypothyroidism, unspecified: Secondary | ICD-10-CM | POA: Diagnosis not present

## 2021-05-15 DIAGNOSIS — N1831 Chronic kidney disease, stage 3a: Secondary | ICD-10-CM | POA: Diagnosis not present

## 2021-05-15 DIAGNOSIS — Z1331 Encounter for screening for depression: Secondary | ICD-10-CM | POA: Diagnosis not present

## 2021-05-15 DIAGNOSIS — I25810 Atherosclerosis of coronary artery bypass graft(s) without angina pectoris: Secondary | ICD-10-CM | POA: Diagnosis not present

## 2021-05-15 DIAGNOSIS — I129 Hypertensive chronic kidney disease with stage 1 through stage 4 chronic kidney disease, or unspecified chronic kidney disease: Secondary | ICD-10-CM | POA: Diagnosis not present

## 2021-05-15 DIAGNOSIS — Z9861 Coronary angioplasty status: Secondary | ICD-10-CM | POA: Diagnosis not present

## 2021-05-15 DIAGNOSIS — Z1339 Encounter for screening examination for other mental health and behavioral disorders: Secondary | ICD-10-CM | POA: Diagnosis not present

## 2021-05-15 DIAGNOSIS — C786 Secondary malignant neoplasm of retroperitoneum and peritoneum: Secondary | ICD-10-CM | POA: Diagnosis not present

## 2021-05-15 DIAGNOSIS — I5022 Chronic systolic (congestive) heart failure: Secondary | ICD-10-CM | POA: Diagnosis not present

## 2021-05-15 DIAGNOSIS — C641 Malignant neoplasm of right kidney, except renal pelvis: Secondary | ICD-10-CM | POA: Diagnosis not present

## 2021-05-15 DIAGNOSIS — D6869 Other thrombophilia: Secondary | ICD-10-CM | POA: Diagnosis not present

## 2021-05-15 DIAGNOSIS — Z0000 Encounter for general adult medical examination without abnormal findings: Secondary | ICD-10-CM | POA: Diagnosis not present

## 2021-05-15 DIAGNOSIS — I6529 Occlusion and stenosis of unspecified carotid artery: Secondary | ICD-10-CM | POA: Diagnosis not present

## 2021-05-15 NOTE — Telephone Encounter (Signed)
Contacted patient with CT results as directed by Dr. Alen Blew. Patient verbalized understanding of information.

## 2021-05-15 NOTE — Telephone Encounter (Signed)
-----   Message from Wyatt Portela, MD sent at 05/15/2021 10:12 AM EST ----- Please let him know the CT scan did not show any evidence of cancer

## 2021-05-16 ENCOUNTER — Other Ambulatory Visit: Payer: Self-pay | Admitting: Radiology

## 2021-05-16 ENCOUNTER — Encounter (HOSPITAL_COMMUNITY): Payer: Self-pay

## 2021-05-16 ENCOUNTER — Ambulatory Visit (HOSPITAL_COMMUNITY)
Admission: RE | Admit: 2021-05-16 | Discharge: 2021-05-16 | Disposition: A | Discharge status: Home / Self Care | Payer: Medicare HMO | Source: Ambulatory Visit | Attending: Urology | Admitting: Urology

## 2021-05-16 ENCOUNTER — Emergency Department (HOSPITAL_COMMUNITY)
Admission: EM | Admit: 2021-05-16 | Discharge: 2021-05-16 | Disposition: A | Discharge status: Home / Self Care | Payer: Medicare HMO | Attending: Emergency Medicine | Admitting: Emergency Medicine

## 2021-05-16 ENCOUNTER — Emergency Department (HOSPITAL_COMMUNITY): Payer: Medicare HMO

## 2021-05-16 ENCOUNTER — Other Ambulatory Visit: Payer: Self-pay

## 2021-05-16 DIAGNOSIS — Z7902 Long term (current) use of antithrombotics/antiplatelets: Secondary | ICD-10-CM | POA: Insufficient documentation

## 2021-05-16 DIAGNOSIS — I2510 Atherosclerotic heart disease of native coronary artery without angina pectoris: Secondary | ICD-10-CM | POA: Diagnosis not present

## 2021-05-16 DIAGNOSIS — M545 Low back pain, unspecified: Secondary | ICD-10-CM | POA: Insufficient documentation

## 2021-05-16 DIAGNOSIS — M549 Dorsalgia, unspecified: Secondary | ICD-10-CM | POA: Diagnosis not present

## 2021-05-16 DIAGNOSIS — N2889 Other specified disorders of kidney and ureter: Secondary | ICD-10-CM | POA: Insufficient documentation

## 2021-05-16 DIAGNOSIS — M2578 Osteophyte, vertebrae: Secondary | ICD-10-CM | POA: Diagnosis not present

## 2021-05-16 DIAGNOSIS — N280 Ischemia and infarction of kidney: Secondary | ICD-10-CM | POA: Diagnosis not present

## 2021-05-16 DIAGNOSIS — C641 Malignant neoplasm of right kidney, except renal pelvis: Secondary | ICD-10-CM | POA: Insufficient documentation

## 2021-05-16 LAB — CBC WITH DIFFERENTIAL/PLATELET
Abs Immature Granulocytes: 0.03 10*3/uL (ref 0.00–0.07)
Basophils Absolute: 0 10*3/uL (ref 0.0–0.1)
Basophils Relative: 0 %
Eosinophils Absolute: 0 10*3/uL (ref 0.0–0.5)
Eosinophils Relative: 0 %
HCT: 37.1 % — ABNORMAL LOW (ref 39.0–52.0)
Hemoglobin: 12.2 g/dL — ABNORMAL LOW (ref 13.0–17.0)
Immature Granulocytes: 0 %
Lymphocytes Relative: 16 %
Lymphs Abs: 1.2 10*3/uL (ref 0.7–4.0)
MCH: 30.1 pg (ref 26.0–34.0)
MCHC: 32.9 g/dL (ref 30.0–36.0)
MCV: 91.6 fL (ref 80.0–100.0)
Monocytes Absolute: 0.7 10*3/uL (ref 0.1–1.0)
Monocytes Relative: 10 %
Neutro Abs: 5.2 10*3/uL (ref 1.7–7.7)
Neutrophils Relative %: 74 %
Platelets: 161 10*3/uL (ref 150–400)
RBC: 4.05 MIL/uL — ABNORMAL LOW (ref 4.22–5.81)
RDW: 14.6 % (ref 11.5–15.5)
WBC: 7 10*3/uL (ref 4.0–10.5)
nRBC: 0 % (ref 0.0–0.2)

## 2021-05-16 LAB — BASIC METABOLIC PANEL
Anion gap: 8 (ref 5–15)
BUN: 33 mg/dL — ABNORMAL HIGH (ref 8–23)
CO2: 26 mmol/L (ref 22–32)
Calcium: 8.6 mg/dL — ABNORMAL LOW (ref 8.9–10.3)
Chloride: 103 mmol/L (ref 98–111)
Creatinine, Ser: 1.29 mg/dL — ABNORMAL HIGH (ref 0.61–1.24)
GFR, Estimated: 53 mL/min — ABNORMAL LOW (ref >60)
Glucose, Bld: 109 mg/dL — ABNORMAL HIGH (ref 70–99)
Potassium: 3.7 mmol/L (ref 3.5–5.1)
Sodium: 137 mmol/L (ref 135–145)

## 2021-05-16 LAB — URINALYSIS, ROUTINE W REFLEX MICROSCOPIC
Bilirubin Urine: NEGATIVE
Glucose, UA: NEGATIVE mg/dL
Ketones, ur: NEGATIVE mg/dL
Leukocytes,Ua: NEGATIVE
Nitrite: NEGATIVE
Protein, ur: NEGATIVE mg/dL
Specific Gravity, Urine: 1.015 (ref 1.005–1.030)
pH: 5.5 (ref 5.0–8.0)

## 2021-05-16 LAB — PROTIME-INR
INR: 1 (ref 0.8–1.2)
Prothrombin Time: 13.4 seconds (ref 11.4–15.2)

## 2021-05-16 LAB — URINALYSIS, MICROSCOPIC (REFLEX): Bacteria, UA: NONE SEEN

## 2021-05-16 MED ORDER — FENTANYL CITRATE (PF) 100 MCG/2ML IJ SOLN
INTRAMUSCULAR | Status: AC | PRN
  Administered 2021-05-16 (×2): 25 ug via INTRAVENOUS

## 2021-05-16 MED ORDER — FENTANYL CITRATE (PF) 100 MCG/2ML IJ SOLN
INTRAMUSCULAR | Status: AC
  Filled 2021-05-16: qty 2

## 2021-05-16 MED ORDER — LIDOCAINE 5 % EX PTCH: 1.0000 | MEDICATED_PATCH | CUTANEOUS | 0 refills | Status: DC

## 2021-05-16 MED ORDER — ONDANSETRON HCL 4 MG/2ML IJ SOLN
4.0000 mg | Freq: Once | INTRAMUSCULAR | Status: AC
  Administered 2021-05-16: 4 mg via INTRAVENOUS
  Filled 2021-05-16: qty 2

## 2021-05-16 MED ORDER — MIDAZOLAM HCL 2 MG/2ML IJ SOLN
INTRAMUSCULAR | Status: AC | PRN
  Administered 2021-05-16: .5 mg via INTRAVENOUS

## 2021-05-16 MED ORDER — GELATIN ABSORBABLE 12-7 MM EX MISC
CUTANEOUS | Status: AC
  Filled 2021-05-16: qty 1

## 2021-05-16 MED ORDER — MORPHINE SULFATE (PF) 4 MG/ML IV SOLN
4.0000 mg | Freq: Once | INTRAVENOUS | Status: AC
  Administered 2021-05-16: 4 mg via INTRAVENOUS
  Filled 2021-05-16: qty 1

## 2021-05-16 MED ORDER — SODIUM CHLORIDE 0.9 % IV SOLN
INTRAVENOUS | Status: AC | PRN
  Administered 2021-05-16: 10 mL/h via INTRAVENOUS

## 2021-05-16 MED ORDER — OXYCODONE HCL 5 MG PO TABS: 5.0000 mg | ORAL_TABLET | Freq: Once | ORAL | Status: DC

## 2021-05-16 MED ORDER — OXYCODONE-ACETAMINOPHEN 5-325 MG PO TABS
2.0000 | ORAL_TABLET | Freq: Once | ORAL | Status: AC
  Administered 2021-05-16: 2 via ORAL
  Filled 2021-05-16: qty 2

## 2021-05-16 MED ORDER — OXYCODONE HCL 5 MG PO TABS: 5.0000 mg | ORAL_TABLET | ORAL | 0 refills | Status: AC | PRN

## 2021-05-16 MED ORDER — LIDOCAINE HCL (PF) 1 % IJ SOLN
INTRAMUSCULAR | Status: AC
  Filled 2021-05-16: qty 5

## 2021-05-16 MED ORDER — MIDAZOLAM HCL 2 MG/2ML IJ SOLN
INTRAMUSCULAR | Status: AC
  Filled 2021-05-16: qty 2

## 2021-05-16 NOTE — Procedures (Signed)
Vascular and Interventional Radiology Procedure Note  Patient: Ian Cummings DOB: 31-Mar-1929 Medical Record Number: [id number] Note Date/Time: 05/16/21 10:33 AM   Performing Physician: Michaelle Birks, MD Assistant(s): None  Diagnosis: R renal mass  Procedure: RIGHT RENAL MASS BIOPSY  Anesthesia: Conscious Sedation Complications: None Estimated Blood Loss: Minimal Specimens: Sent for Pathology  Findings:  Successful Ultrasound-guided biopsy of R renal mass. A total of 4 samples were obtained. Hemostasis of the tract was achieved using Gelfoam Slurry Embolization.  Plan: Bed rest for 2 hours.  See detailed procedure note with images in PACS. The patient tolerated the procedure well without incident or complication and was returned to Recovery in stable condition.    Michaelle Birks, MD [facility] Interventional Radiology [facility] Radiology   Pager. [place]

## 2021-05-16 NOTE — Discharge Instructions (Signed)
It was a pleasure taking care of you here in th Emergency department.  Your lab work and Urine did not show any abnormality  You CT scan was reassuring as well.  I have written you for a few medications to help with your symptoms at home. Take as prescribed. Return for new or worsening symptoms otherwise follow up with your primary care provider.

## 2021-05-16 NOTE — ED Notes (Signed)
Patient was ambulated in the hallway. Though patient typically uses a walker at home, patient was able to ambulate with a x1 person assist. Patient tolerated the walking well. Patient stated that his pain was much more tolerable currently.

## 2021-05-16 NOTE — ED Provider Notes (Signed)
[facility]ER[facility]ENC[facility] DEPARTMENT Provider Note   CSN: [id number] Arrival date & time: 05/16/21  0631    History  Chief Complaint  Patient presents with   Back Pain    Ian Cummings is a 88 y.o. male with hx of CAD, AAA (no evidence on MR ABD 2022), PAD, suspected met Renal CA here for back pain. States has had intermittent back pain for "long time." Last week fell on his back. Denies hitting head, LOC. Worsening pain over last few days. Uses walker at baseline. Barely able to walk due to pain. No radicular pain. No bowel or bladder incontinence saddle paresthesia, fever, hx of IVDU. Pain does no radiate into legs, abdomen, chest. Took Family home meds (Oxy? Per EMS) without relief. Pain improves when laying flat, worse when up and ambulating.  Apparently EMS stated they were originally called out for transport for Renal biopsy to hospital and when told they don't transport for that, family stated patient with worsening back pain.  Pain 7/10  HPI     Home Medications Prior to Admission medications   Medication Sig Start Date End Date Taking? Authorizing Provider  acetaminophen (TYLENOL) 500 MG tablet Take 1,000 mg by mouth every 6 (six) hours as needed for moderate pain.   Yes [provider]  allopurinol (ZYLOPRIM) 100 MG tablet Take 100 mg by mouth daily.    Yes [provider]  amiodarone (PACERONE) 200 MG tablet Take 0.5 tablets (100 mg total) by mouth daily. 03/05/21  Yes Vickie Epley, MD  escitalopram (LEXAPRO) 10 MG tablet Take 10 mg by mouth daily. 07/17/18  Yes [provider]  furosemide (LASIX) 40 MG tablet Take 40 mg by mouth daily. 05/10/21  Yes [provider]  lansoprazole (PREVACID) 30 MG capsule Take 30 mg by mouth daily. 08/16/19  Yes [provider]  levothyroxine (SYNTHROID) 112 MCG tablet Take 112 mcg by mouth daily before breakfast.   Yes [provider]  lidocaine (LIDODERM) 5 % Place 1  patch onto the skin daily. Remove & Discard patch within 12 hours or as directed by MD 05/16/21  Yes Nalda Shackleford A, PA-C  MELATONIN PO Take 1 tablet by mouth at bedtime as needed (sleep). Unknown strength   Yes [provider]  Multiple Vitamin (MULTIVITAMIN WITH MINERALS) TABS tablet Take 1 tablet by mouth daily. Unknown strength   Yes [provider]  nitroGLYCERIN (NITROSTAT) 0.4 MG SL tablet Place 0.4 mg under the tongue every 5 (five) minutes as needed for chest pain.  09/12/19  Yes [provider]  oxyCODONE (ROXICODONE) 5 MG immediate release tablet Take 1 tablet (5 mg total) by mouth every 4 (four) hours as needed for severe pain. 05/16/21  Yes Amberley Hamler A, PA-C  potassium chloride (KLOR-CON) 10 MEQ tablet Take 10 mEq by mouth daily. 05/09/21  Yes [provider]  rosuvastatin (CRESTOR) 40 MG tablet Take 40 mg by mouth every other day.   Yes [provider]  sacubitril-valsartan (ENTRESTO) 24-26 MG Take 1 tablet by mouth 2 (two) times daily. 10/04/20  Yes Richardo Priest, MD  tamsulosin (FLOMAX) 0.4 MG CAPS capsule Take 0.4 mg by mouth at bedtime.   Yes [provider]  clopidogrel (PLAVIX) 75 MG tablet Take 1 tablet (75 mg total) by mouth daily. Patient not taking: Reported on 05/16/2021 05/22/20   Vickie Epley, MD  doxycycline (VIBRAMYCIN) 100 MG capsule Take 1 capsule by mouth in the morning and at  bedtime. Patient not taking: Reported on 05/16/2021 05/10/21   [provider]  levothyroxine (SYNTHROID) 125 MCG tablet Take 125 mcg by mouth daily. Patient not taking: Reported on 05/16/2021 05/15/21   [provider]  predniSONE (DELTASONE) 10 MG tablet Take 10 mg by mouth in the morning and at bedtime. For 5 days starting 05/13/2020 Patient not taking: Reported on 05/16/2021 05/10/21   [provider]      Allergies    Atorvastatin, Xarelto [rivaroxaban], Amoxicillin, and Metoprolol    Review of  Systems   Review of Systems  Constitutional: Negative.   HENT: Negative.    Respiratory: Negative.    Cardiovascular: Negative.   Gastrointestinal: Negative.   Genitourinary: Negative.   Musculoskeletal:  Positive for back pain and gait problem.  Skin: Negative.   All other systems reviewed and are negative.  Physical Exam Updated Vital Signs BP (!) 144/60 (BP Location: Left Arm)    Pulse 68    Temp 98.3 F (36.8 C) (Oral)    Resp 16    Ht _0  (1.778 m)    Wt 69 kg    SpO2 98%    BMI 21.83 kg/m  Physical Exam Physical Exam  Constitutional: Pt appears well-developed and well-nourished. No distress.  HENT:  Head: Normocephalic and atraumatic.  Mouth/Throat: Oropharynx is clear and moist. No oropharyngeal exudate.  Eyes: Conjunctivae are normal.  Neck: Normal range of motion. Neck supple.  Full ROM without pain  Cardiovascular: Normal rate, regular rhythm and intact distal pulses.  1+ DP, PT pulses Bl Pulmonary/Chest: Effort normal and breath sounds normal. No respiratory distress. Pt has no wheezes.  Abdominal: Soft. Pt exhibits no distension. There is no tenderness, rebound or guarding. No abd bruit or pulsatile mass Musculoskeletal:  Full range of motion of the T-spine and L-spine with flexion, hyperextension, and lateral flexion. No midline tenderness or stepoffs. No tenderness to palpation of the spinous processes of the T-spine or L-spine. Mild tenderness to palpation of the paraspinous muscles of the lower thoracic, upper L-spine.Neg straight leg raise.  Pelvis stable, nontender palpation.  No bony tenderness to bilateral upper or lower extremities.  Able to flex and extend at bilateral knees, ankles, hips Lymphadenopathy:    Pt has no cervical adenopathy.  Neurological: Pt is alert. Pt has normal reflexes.  Reflex Scores:      Bicep reflexes are 2+ on the right side and 2+ on the left side.      Brachioradialis reflexes are 2+ on the right side and 2+ on the left side.       Patellar reflexes are 2+ on the right side and 2+ on the left side.      Achilles reflexes are 2+ on the right side and 2+ on the left side. Speech is clear and goal oriented, follows commands Normal 5/5 strength in upper and lower extremities bilaterally including dorsiflexion and plantar flexion, strong and equal grip strength Sensation normal to light and sharp touch Moves extremities without ataxia, coordination intact Normal balance No Clonus Skin: Skin is warm and dry. No rash noted or lesions noted. Pt is not diaphoretic. No erythema, ecchymosis,edema or warmth.  Psychiatric: Pt has a normal mood and affect. Behavior is normal.  Nursing note and vitals reviewed.  ED Results / Procedures / Treatments   Labs (all labs ordered are listed, but only abnormal results are displayed) Labs Reviewed  CBC WITH DIFFERENTIAL/PLATELET - Abnormal; Notable for the following components:  Result Value   RBC 4.05 (*)    Hemoglobin 12.2 (*)    HCT 37.1 (*)    All other components within normal limits  BASIC METABOLIC PANEL - Abnormal; Notable for the following components:   Glucose, Bld 109 (*)    BUN 33 (*)    Creatinine, Ser 1.29 (*)    Calcium 8.6 (*)    GFR, Estimated 53 (*)    All other components within normal limits  URINALYSIS, ROUTINE W REFLEX MICROSCOPIC - Abnormal; Notable for the following components:   Hgb urine dipstick TRACE (*)    All other components within normal limits  URINALYSIS, MICROSCOPIC (REFLEX)  PROTIME-INR    EKG None  Radiology CT Chest Wo Contrast  Result Date: 05/15/2021 CLINICAL DATA:  History of metastatic renal cell carcinoma. Restaging. EXAM: CT CHEST WITHOUT CONTRAST TECHNIQUE: Multidetector CT imaging of the chest was performed following the standard protocol without IV contrast. RADIATION DOSE REDUCTION: This exam was performed according to the departmental dose-optimization program which includes automated exposure control, adjustment of the  mA and/or kV according to patient size and/or use of iterative reconstruction technique. COMPARISON:  CT AP 04/05/2021 FINDINGS: Cardiovascular: There is a left chest wall pacer device with leads in the right atrial appendage and right ventricle. Status post median sternotomy and CABG procedure. Heart size is enlarged. No pericardial effusion. Aortic atherosclerotic calcifications identified. Mediastinum/Nodes: Normal appearance of the thyroid gland. The trachea appears patent and is midline. Patulous esophagus noted which may reflect underlying dysmotility. Moderate size hiatal hernia. Prominent bilateral axillary lymph nodes are identified, many of these maintain a benign appearing fatty hilum. Within the right axilla there is a lymph node measuring 1.3 cm short axis, image 22/2. Also in the right axilla is a lymph node measuring 1.2 cm short axis, image 34/2. No enlarged supraclavicular lymph nodes. Subcarinal lymph node is enlarged measuring 1.6 cm short axis, image 74/2. Lungs/Pleura: No pleural effusion, airspace consolidation, atelectasis or pneumothorax. Biapical pleuroparenchymal scarring noted. Bilateral lower lung zone predominant interstitial reticulation, mild traction bronchiectasis and subpleural honeycombing identified compatible with chronic fibrotic interstitial lung disease. Non solid nodule within the periphery of the posterolateral right upper lobe measures 6 mm, image 67/7. No suspicious solid nodules identified to suggest pulmonary metastasis. Upper Abdomen: No acute abnormality identified within the imaged portions of the upper abdomen. Heterogeneous mass within the upper pole of the right kidney is again demonstrated measuring approximately 7.4 x 5.6 cm, image 154/2. Musculoskeletal: No chest wall mass or suspicious bone lesions identified. IMPRESSION: 1. No specific findings identified to suggest metastatic disease to the chest. 2. There is a nonspecific, 6 mm none solid nodule within the  periphery of the right upper lobe. Initial follow-up with CT at 6-12 months is recommended to confirm persistence. If persistent, repeat CT is recommended every 2 years until 5 years of stability has been established. This recommendation follows the consensus statement: Guidelines for Management of Incidental Pulmonary Nodules Detected on CT Images: From the Fleischner Society 2017; Radiology 2017; 284:228-243. 3. Borderline enlarged mediastinal and right axillary lymph nodes. The appearance is nonspecific, and may be reactive in the setting of chronic CHF and interstitial lung disease. Metastatic disease would be difficult to exclude with a high degree of certainty. 4. Chronic fibrotic interstitial lung disease. 5. Hiatal hernia. 6. Aortic Atherosclerosis (ICD10-I70.0). Electronically Signed   By: Kerby Moors M.D.   On: 05/15/2021 10:04   CT Thoracic Spine Wo Contrast  Result Date: 05/16/2021 CLINICAL  DATA:  Thoracic and lumbar spine pain after a fall. History of renal cell carcinoma. EXAM: CT THORACIC AND LUMBAR SPINE WITHOUT CONTRAST TECHNIQUE: Multidetector CT imaging of the thoracic and lumbar spine was performed without contrast. Multiplanar CT image reconstructions were also generated. RADIATION DOSE REDUCTION: This exam was performed according to the departmental dose-optimization program which includes automated exposure control, adjustment of the mA and/or kV according to patient size and/or use of iterative reconstruction technique. COMPARISON:  Chest CT 05/14/2021.  CT abdomen and pelvis 04/05/2021. FINDINGS: CT THORACIC SPINE FINDINGS Alignment: Normal. Vertebrae: No acute fracture or suspicious osseous lesion. T10 butterfly vertebra. Paraspinal and other soft tissues: Hiatal hernia. Aortic and coronary atherosclerosis. Pacemaker. Cardiomegaly. Biapical pleuroparenchymal lung scarring and basilar predominant fibrotic interstitial lung disease as noted on the prior chest CT. Disc levels:  Spondylosis with bridging vertebral osteophytes throughout the majority of the thoracic spine. Moderate disc and facet degeneration in the included lower cervical spine. No evidence of high-grade spinal stenosis or neural foraminal stenosis in the thoracic spine. CT LUMBAR SPINE FINDINGS Segmentation: 5 lumbar type vertebrae. Alignment: Normal. Vertebrae: No acute fracture or suspicious osseous lesion. Paraspinal and other soft tissues: Partially visualized known right renal tumor. Small left renal cyst. Similar appearance of prominent subcentimeter retroperitoneal lymph nodes compared to the prior abdominal CT colonic diverticulosis. Abdominal aortic atherosclerosis without aneurysm. Disc levels: Moderate disc space narrowing from L3-4 to L5-S1. Advanced diffuse lumbar facet hypertrophy. Spinal stenosis is mild at L2-3, moderate at L3-4, and severe at L4-5. Mild multilevel neural foraminal stenosis. IMPRESSION: 1. No acute fracture or suspicious osseous lesion in the thoracic or lumbar spine. 2. Lumbar disc and facet degeneration with multilevel spinal stenosis, severe at L4-5. 3. Partially visualized known right renal tumor. 4. Aortic Atherosclerosis (ICD10-I70.0). Electronically Signed   By: Logan Bores M.D.   On: 05/16/2021 08:23   CT Lumbar Spine Wo Contrast  Result Date: 05/16/2021 CLINICAL DATA:  Thoracic and lumbar spine pain after a fall. History of renal cell carcinoma. EXAM: CT THORACIC AND LUMBAR SPINE WITHOUT CONTRAST TECHNIQUE: Multidetector CT imaging of the thoracic and lumbar spine was performed without contrast. Multiplanar CT image reconstructions were also generated. RADIATION DOSE REDUCTION: This exam was performed according to the departmental dose-optimization program which includes automated exposure control, adjustment of the mA and/or kV according to patient size and/or use of iterative reconstruction technique. COMPARISON:  Chest CT 05/14/2021.  CT abdomen and pelvis 04/05/2021.  FINDINGS: CT THORACIC SPINE FINDINGS Alignment: Normal. Vertebrae: No acute fracture or suspicious osseous lesion. T10 butterfly vertebra. Paraspinal and other soft tissues: Hiatal hernia. Aortic and coronary atherosclerosis. Pacemaker. Cardiomegaly. Biapical pleuroparenchymal lung scarring and basilar predominant fibrotic interstitial lung disease as noted on the prior chest CT. Disc levels: Spondylosis with bridging vertebral osteophytes throughout the majority of the thoracic spine. Moderate disc and facet degeneration in the included lower cervical spine. No evidence of high-grade spinal stenosis or neural foraminal stenosis in the thoracic spine. CT LUMBAR SPINE FINDINGS Segmentation: 5 lumbar type vertebrae. Alignment: Normal. Vertebrae: No acute fracture or suspicious osseous lesion. Paraspinal and other soft tissues: Partially visualized known right renal tumor. Small left renal cyst. Similar appearance of prominent subcentimeter retroperitoneal lymph nodes compared to the prior abdominal CT colonic diverticulosis. Abdominal aortic atherosclerosis without aneurysm. Disc levels: Moderate disc space narrowing from L3-4 to L5-S1. Advanced diffuse lumbar facet hypertrophy. Spinal stenosis is mild at L2-3, moderate at L3-4, and severe at L4-5. Mild multilevel neural foraminal stenosis. IMPRESSION: 1. No  acute fracture or suspicious osseous lesion in the thoracic or lumbar spine. 2. Lumbar disc and facet degeneration with multilevel spinal stenosis, severe at L4-5. 3. Partially visualized known right renal tumor. 4. Aortic Atherosclerosis (ICD10-I70.0). Electronically Signed   By: Logan Bores M.D.   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 Unfortunately currently being worked up for likely metastatic renal CA.  Sounds like patient has had intermittent back pain however had a fall last week, pain is been worsening.  Radicular symptoms.  Does not radiate into the legs, abdomen.  He is negative straight leg raise bilaterally.  I am able to reproduce his pain to his lower thoracic, upper lumbar region.  Patient neurovascularly intact.  No overlying skin changes.  Afebrile, nonseptic, not ill-appearing.  Heart and lungs clear, abdomen soft, nontender.  No bony tenderness to lower extremities.  He denies hitting his head, LOC with his fall.  We will plan on labs, imaging and reassess  Labs and imaging personally reviewed and interpreted:  CBC without leukocytosis, hemoglobin 12.2 BMP glucose 109, creatinine 1.29, similar to prior Urine negative for infection  CT thoracic, lumbar without any bony abnormality, evidence of metastatic disease  Patient reassessed.  Pain controlled.  Will attempt to ambulate.  Patient reassessed. Pain control. Able to ambulate without difficulty in halls.  At this time I low suspicion for acute neurosurgical emergency such as cauda equina, discitis, osteomyelitis, transverse myelitis, psoas abscess, acute intra-abdominal etiology such as AAA, dissection, vascular etiology such as PE, ischemia, infectious etiology.  IR PA spoken with patient.  Patient will be discharged from emergency department and will have renal biopsy performed through short stay which was previously scheduled for today.  Discussed this plan with patient's wife via telephone.  She is agreeable.  Encourage close follow-up outpatient with PCP.  The patient has been appropriately medically screened and/or stabilized in the ED. I have low suspicion for any other emergent medical condition which would require further screening, evaluation or treatment in the ED or require inpatient  management.  Patient is hemodynamically stable and in no acute distress.  Patient able to ambulate in department prior to ED.  Evaluation does not show acute pathology that would require ongoing or additional emergent interventions while in the emergency department or further inpatient treatment.  I have discussed the diagnosis with the patient and answered all questions.  Pain is been managed while in the emergency department and patient has no further complaints prior to discharge.  Patient is comfortable with plan discussed in room and is stable for discharge at this time.  I have discussed strict return precautions for returning to the emergency department.  Patient was encouraged to follow-up with PCP/specialist refer to at discharge.                            Medical Decision Making Amount and/or Complexity of Data Reviewed Independent Historian: spouse External Data Reviewed: labs, radiology, ECG and notes.    Details: Urology for biopsy, renal CA, oncology, PCP notes imaging, cards notes imaging, ekg Labs: ordered. Decision-making details documented in  ED Course. Radiology: ordered and independent interpretation performed. Decision-making details documented in ED Course.  Risk OTC drugs. Prescription drug management. Parenteral controlled substances. Diagnosis or treatment significantly limited by social determinants of health. Risk Details: Do not feel patient needs inpatient hospitalization at this time.  Risk>> Elderly, multiple co-morbidities          Final Clinical Impression(s) / ED Diagnoses Final diagnoses:  Midline low back pain without sciatica, unspecified chronicity    Rx / DC Orders ED Discharge Orders          Ordered    oxyCODONE (ROXICODONE) 5 MG immediate release tablet  Every 4 hours PRN        05/16/21 0855    lidocaine (LIDODERM) 5 %  Every 24 hours        05/16/21 0855              Raymir Frommelt A, PA-C 05/16/21 [id number]     Isla Pence, MD 05/16/21 1035

## 2021-05-16 NOTE — Sedation Documentation (Signed)
Fentanyl 25 mcg given at request of Dr Maryelizabeth Kaufmann as patient moving a bit with needle insertion.

## 2021-05-16 NOTE — Sedation Documentation (Signed)
Patient placed flat.

## 2021-05-16 NOTE — Sedation Documentation (Signed)
Brought to [employer]

## 2021-05-16 NOTE — Sedation Documentation (Signed)
SBP 85 to 77. Awake, color pink, skin warm and dry. No pain. Need to be cautious giving fluid due to low EF. Placed in Trendelenberg and BP 102/54. Dr Wende Crease aware. To observe.

## 2021-05-16 NOTE — ED Triage Notes (Signed)
Pt presents to the ED from home via [facility] with complaints of back pain. Initially, pt called for transport to here for scheduled kidney biopsy at 0600. EMS informed him that they didn't do that so he began complaining of low back pain. Pt was given 2 doses of wife's Oxycodone prior to arrival, last dose at 0600 this am.

## 2021-05-16 NOTE — H&P (Signed)
Chief Complaint: Patient was seen in consultation today for right renal mass  Referring Physician(s): Dr. Rexene Alberts  Supervising Physician: Sandi Mariscal  Patient Status: [facility] - ED  History of Present Illness: Ian Cummings is a 88 y.o. male with past medical history notable for anxiety, CAD, prior right carotid stent placement, CKD stage III, HTN, unstable angina who was found to have a right renal mass in October 2021.  At that time, he was not felt to be a surgical candidate. His most recent CT scan in 10/2020 showed enlargement of the renal mass with prominent borderline retroperitoneal lymph nodes.  He was referred to Oncology who determined his clinical and function status were reasonable for potential palliative treatment if diagnosis confirmed.  Patient was referred to IR for renal mass biopsy.   Patient was scheduled for biopsy today, however when he awoke this AM to begin preparing for his appointment he had sudden onset intense back pain and could not get OOB.  He called EMS and was evaluated in the ED for his back pain.   CT Thoracic and Lumbar Spine  1. No acute fracture or suspicious osseous lesion in the thoracic or lumbar spine. 2. Lumbar disc and facet degeneration with multilevel spinal stenosis, severe at L4-5. 3. Partially visualized known right renal tumor. 4. Aortic Atherosclerosis (ICD10-I70.0).  After evaluation in the ED, his pain has subsided.  All other labs are stable. He is feeling much improved with pain management. Plan made to proceed with biopsy as scheduled today.   Patient reports his wife will be providing transportation home today.   Past Medical History:  Diagnosis Date   AAA (abdominal aortic aneurysm)    2004, Pt stated not there now   Anginal pain ([place])    Anxiety    Arthritis    Atherosclerosis of native arteries of the extremities with intermittent claudication 12/04/2011   ABI .14 and .54 in 2012    Atherosclerotic PVD with  intermittent claudication ([place]) 02/20/2014   BPH (benign prostatic hypertrophy)    Bradycardia 01/12/2019   Bradycardia on ECG 11/11/2017   CAD (coronary artery disease)    Carotid artery disease ([place])    Right carotid stent 2013   Carotid artery disease without cerebral infarction [facility])    S/p right carotid stent for asymptomatic disease  2013    Chronic diastolic (congestive) heart failure ([place]) 09/10/2019   Chronic kidney disease, stage 3a ([place]) 09/10/2019   Claudication ([place])    Colon polyps    adenomatous   Complex sleep apnea syndrome 11/11/2017   Conjunctival lesion 05/04/2014   Formatting of this note might be different from the original. left   Coronary artery disease    Coronary artery disease involving native coronary artery of native heart with unstable angina pectoris ([place]) 09/06/2012   CABG w LIMA to LAD, SVG to OM1-OM2, SVG to dx, SVG to PD/PL 1995,  Cypher stent x 2 SVG to RCA and Cypher stent to Circ Graft 05/13/07, stent of SVG to circ OM in [place] in July 2013  Cath 4/14 [facility] normal Left main, occluded mid LAD, occluded RCA SVG, occluded CFX, widely patent OM 1 SVG, occluded RCA SVG, occluded Diag 1 SVG, widely patent LAD LIMA graft;  Redo CABG Dr. Servando Snare  10/18/12 with    Depression    Essential hypertension 01/12/2019   GERD (gastroesophageal reflux disease)    Gout    H/O hiatal hernia    Hyperlipidemia    Hypertension  Hypertension, renal disease, stage 1-4 or unspecified chronic kidney disease    Hypothyroidism    Malnutrition of moderate degree 08/05/2015   MI (myocardial infarction) ([place]) 4/14   Mixed hyperlipidemia    Neuropathy    Hx: of in B/L toes   OSA on CPAP 02/17/2018   PAD (peripheral artery disease) ([place]) 08/03/2015   PAF (paroxysmal atrial fibrillation) ([place])    Peripheral vascular disease ([place])    with claudication   Pre-diabetes    RBBB 01/12/2019   s/p CABG    Shoulder pain 01/27/2019   Stroke ([place])    TIA (transient ischemic attack)    Hx:  of   Unstable angina ([place]) 09/10/2019    Past Surgical History:  Procedure Laterality Date   CARDIOVERSION N/A 02/22/2020   Procedure: CARDIOVERSION;  Surgeon: [name], MD;  Location: [place];  Service: Cardiovascular;  Laterality: N/A;   CAROTID STENT INSERTION Right 2013   right at [place] EXTRACTION Bilateral    Hx: of both eyes   COLONOSCOPY     Hx: of   COLONOSCOPY     CORONARY ANGIOPLASTY WITH STENT PLACEMENT  2010   CORONARY ARTERY BYPASS GRAFT  1995   CORONARY ARTERY BYPASS GRAFT N/A 10/18/2012   Procedure: REDO CORONARY ARTERY BYPASS GRAFTING (CABG);  Surgeon: Grace Isaac, MD;  Location: [place];  Service: Open Heart Surgery;  Laterality: N/A;  off pump times two using endoscopically harvested left saphenous vein   CORONARY STENT PLACEMENT  06/2016   ENDARTERECTOMY FEMORAL Right 08/03/2015   Procedure: RIGHT FEMORAL ENDARTERECTOMY WITH PATCH ANGIOPLASTY;  Surgeon: Serafina Mitchell, MD;  Location: [place];  Service: Vascular;  Laterality: Right;   FEMORAL-POPLITEAL BYPASS GRAFT Right 08/03/2015   Procedure: RIGHT FEMORAL-BELOW KNEE POPLITEAL ARTERY BYPASS GRAFT;  Surgeon: Serafina Mitchell, MD;  Location: [place];  Service: Vascular;  Laterality: Right;   FOOT SURGERY Left    FRACTURE SURGERY Left    Hx: of Left heel   INGUINAL HERNIA REPAIR Bilateral    X 2    INTRAOPERATIVE TRANSESOPHAGEAL ECHOCARDIOGRAM N/A 10/18/2012   Procedure: INTRAOPERATIVE TRANSESOPHAGEAL ECHOCARDIOGRAM;  Surgeon: Grace Isaac, MD;  Location: [place];  Service: Open Heart Surgery;  Laterality: N/A;   LEFT HEART CATH AND CORS/GRAFTS ANGIOGRAPHY N/A 10/25/2019   Procedure: LEFT HEART CATH AND CORS/GRAFTS ANGIOGRAPHY;  Surgeon: Leonie Man, MD;  Location: [place] CV LAB;  Service: Cardiovascular;  Laterality: N/A;   LEFT HEART CATHETERIZATION WITH CORONARY/GRAFT ANGIOGRAM N/A 02/02/2013   Procedure: LEFT HEART CATHETERIZATION WITH Beatrix Fetters;  Surgeon:  Jacolyn Reedy, MD;  Location: [facility] CATH LAB;  Service: Cardiovascular;  Laterality: N/A;   LOWER EXTREMITY ANGIOGRAM Right 06/27/2015   Procedure: Lower Extremity Angiogram;  Surgeon: Serafina Mitchell, MD;  Location: [place] CV LAB;  Service: Cardiovascular;  Laterality: Right;   MASTOIDECTOMY  1933   PACEMAKER IMPLANT N/A 05/21/2020   Procedure: PACEMAKER IMPLANT;  Surgeon: Vickie Epley, MD;  Location: [place] CV LAB;  Service: Cardiovascular;  Laterality: N/A;   PERIPHERAL VASCULAR CATHETERIZATION N/A 01/23/2015   Procedure: Abdominal Aortogram;  Surgeon: Serafina Mitchell, MD;  Location: [place] CV LAB;  Service: Cardiovascular;  Laterality: N/A;   PERIPHERAL VASCULAR CATHETERIZATION N/A 06/27/2015   Procedure: Abdominal Aortogram;  Surgeon: Serafina Mitchell, MD;  Location: [place] CV LAB;  Service: Cardiovascular;  Laterality: N/A;   RETINAL DETACHMENT SURGERY Left    Hx: of left eye  TEE WITHOUT CARDIOVERSION N/A 02/22/2020   Procedure: TRANSESOPHAGEAL ECHOCARDIOGRAM (TEE);  Surgeon: [name], MD;  Location: [facility] ENDOSCOPY;  Service: Cardiovascular;  Laterality: N/A;   TONSILLECTOMY      Allergies: Atorvastatin, Xarelto [rivaroxaban], Amoxicillin, and Metoprolol  Medications: Prior to Admission medications   Medication Sig Start Date End Date Taking? Authorizing Provider  acetaminophen (TYLENOL) 500 MG tablet Take 1,000 mg by mouth every 6 (six) hours as needed for moderate pain.   Yes [provider]  allopurinol (ZYLOPRIM) 100 MG tablet Take 100 mg by mouth daily.    Yes [provider]  amiodarone (PACERONE) 200 MG tablet Take 0.5 tablets (100 mg total) by mouth daily. 03/05/21  Yes Vickie Epley, MD  escitalopram (LEXAPRO) 10 MG tablet Take 10 mg by mouth daily. 07/17/18  Yes [provider]  furosemide (LASIX) 40 MG tablet Take 40 mg by mouth daily. 05/10/21  Yes [provider]  lansoprazole (PREVACID) 30 MG capsule  Take 30 mg by mouth daily. 08/16/19  Yes [provider]  levothyroxine (SYNTHROID) 112 MCG tablet Take 112 mcg by mouth daily before breakfast.   Yes [provider]  lidocaine (LIDODERM) 5 % Place 1 patch onto the skin daily. Remove & Discard patch within 12 hours or as directed by MD 05/16/21  Yes Henderly, Britni A, PA-C  MELATONIN PO Take 1 tablet by mouth at bedtime as needed (sleep). Unknown strength   Yes [provider]  Multiple Vitamin (MULTIVITAMIN WITH MINERALS) TABS tablet Take 1 tablet by mouth daily. Unknown strength   Yes [provider]  nitroGLYCERIN (NITROSTAT) 0.4 MG SL tablet Place 0.4 mg under the tongue every 5 (five) minutes as needed for chest pain.  09/12/19  Yes [provider]  oxyCODONE (ROXICODONE) 5 MG immediate release tablet Take 1 tablet (5 mg total) by mouth every 4 (four) hours as needed for severe pain. 05/16/21  Yes Henderly, Britni A, PA-C  potassium chloride (KLOR-CON) 10 MEQ tablet Take 10 mEq by mouth daily. 05/09/21  Yes [provider]  rosuvastatin (CRESTOR) 40 MG tablet Take 40 mg by mouth every other day.   Yes [provider]  sacubitril-valsartan (ENTRESTO) 24-26 MG Take 1 tablet by mouth 2 (two) times daily. 10/04/20  Yes Richardo Priest, MD  tamsulosin (FLOMAX) 0.4 MG CAPS capsule Take 0.4 mg by mouth at bedtime.   Yes [provider]  clopidogrel (PLAVIX) 75 MG tablet Take 1 tablet (75 mg total) by mouth daily. Patient not taking: Reported on 05/16/2021 05/22/20   Vickie Epley, MD  doxycycline (VIBRAMYCIN) 100 MG capsule Take 1 capsule by mouth in the morning and at bedtime. Patient not taking: Reported on 05/16/2021 05/10/21   [provider]  levothyroxine (SYNTHROID) 125 MCG tablet Take 125 mcg by mouth daily. Patient not taking: Reported on 05/16/2021 05/15/21   [provider]  predniSONE (DELTASONE) 10 MG tablet Take 10 mg by mouth in the morning and at  bedtime. For 5 days starting 05/13/2020 Patient not taking: Reported on 05/16/2021 05/10/21   [provider]     Family History  Problem Relation Age of Onset   Heart disease Mother        After 40 yrs of age   Heart attack Mother    Hyperlipidemia Father    Hypertension Father    Heart disease Father        After 77 yrs of age   Diabetes Father  Stroke Sister    Colon cancer Neg Hx    Stomach cancer Neg Hx    Rectal cancer Neg Hx    Esophageal cancer Neg Hx    Liver disease Neg Hx    Kidney disease Neg Hx     Social History   Socioeconomic History   Marital status: Married    Spouse name: Not on file   Number of children: 3   Years of education: Not on file   Highest education level: Not on file  Occupational History   Occupation: retired  Tobacco Use   Smoking status: Former    Types: Cigarettes    Quit date: 04/29/1975    Years since quitting: 46.0   Smokeless tobacco: Never  Vaping Use   Vaping Use: Never used  Substance and Sexual Activity   Alcohol use: Yes    Alcohol/week: 3.0 standard drinks    Types: 3 Glasses of wine per week    Comment: 3 glasses of wine today   Drug use: No   Sexual activity: Not Currently  Other Topics Concern   Not on file  Social History Narrative   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  All other systems are negative.  Review of Systems  Constitutional:  Negative for fatigue and fever.  Respiratory:  Negative for cough and shortness of breath.   Cardiovascular:  Negative for chest pain.  Gastrointestinal:  Negative for abdominal pain, constipation and nausea.  Musculoskeletal:  Positive for  back pain (improved, midline, bilateral).  Psychiatric/Behavioral:  Negative for behavioral problems and confusion.    Vital Signs: BP (!) 144/60 (BP Location: Left Arm)    Pulse 68    Temp 98.3 F (36.8 C) (Oral)    Resp 16    Ht 5\' 10"  (1.778 m)    Wt 152 lb 1.9 oz (69 kg)    SpO2 98%    BMI 21.83 kg/m   Physical Exam Vitals and nursing note reviewed.  Constitutional:      General: He is not in acute distress.    Appearance: Normal appearance. He is not ill-appearing.  HENT:     Mouth/Throat:     Mouth: Mucous membranes are moist.     Pharynx: Oropharynx is clear.  Cardiovascular:     Rate and Rhythm: Normal rate and regular rhythm.  Pulmonary:     Effort: Pulmonary effort is normal.     Breath sounds: Normal breath sounds.  Abdominal:     General: Abdomen is flat.     Palpations: Abdomen is soft.  Skin:    General: Skin is warm and dry.  Neurological:     General: No focal deficit present.     Mental Status: He is alert and oriented to person, place, and time. Mental status is at baseline.  Psychiatric:        Mood and Affect: Mood normal.        Behavior: Behavior normal.        Thought Content: Thought content normal.        Judgment: Judgment normal.     MD Evaluation Airway: WNL Heart: WNL  Abdomen: WNL Chest/ Lungs: WNL ASA  Classification: 3 Mallampati/Airway Score: Two   Imaging: CT Chest Wo Contrast  Result Date: 05/15/2021 CLINICAL DATA:  History of metastatic renal cell carcinoma. Restaging. EXAM: CT CHEST WITHOUT CONTRAST TECHNIQUE: Multidetector CT imaging of the chest was performed following the standard protocol without IV contrast. RADIATION DOSE REDUCTION: This exam was performed according to the departmental dose-optimization program which includes automated exposure control, adjustment of the mA and/or kV according to patient size and/or use of iterative reconstruction technique. COMPARISON:  CT AP 04/05/2021 FINDINGS: Cardiovascular: There is a  left chest wall pacer device with leads in the right atrial appendage and right ventricle. Status post median sternotomy and CABG procedure. Heart size is enlarged. No pericardial effusion. Aortic atherosclerotic calcifications identified. Mediastinum/Nodes: Normal appearance of the thyroid gland. The trachea appears patent and is midline. Patulous esophagus noted which may reflect underlying dysmotility. Moderate size hiatal hernia. Prominent bilateral axillary lymph nodes are identified, many of these maintain a benign appearing fatty hilum. Within the right axilla there is a lymph node measuring 1.3 cm short axis, image 22/2. Also in the right axilla is a lymph node measuring 1.2 cm short axis, image 34/2. No enlarged supraclavicular lymph nodes. Subcarinal lymph node is enlarged measuring 1.6 cm short axis, image 74/2. Lungs/Pleura: No pleural effusion, airspace consolidation, atelectasis or pneumothorax. Biapical pleuroparenchymal scarring noted. Bilateral lower lung zone predominant interstitial reticulation, mild traction bronchiectasis and subpleural honeycombing identified compatible with chronic fibrotic interstitial lung disease. Non solid nodule within the periphery of the posterolateral right upper lobe measures 6 mm, image 67/7. No suspicious solid nodules identified to suggest pulmonary metastasis. Upper Abdomen: No acute abnormality identified within the imaged portions of the upper abdomen. Heterogeneous mass within the upper pole of the right kidney is again demonstrated measuring approximately 7.4 x 5.6 cm, image 154/2. Musculoskeletal: No chest wall mass or suspicious bone lesions identified. IMPRESSION: 1. No specific findings identified to suggest metastatic disease to the chest. 2. There is a nonspecific, 6 mm none solid nodule within the periphery of the right upper lobe. Initial follow-up with CT at 6-12 months is recommended to confirm persistence. If persistent, repeat CT is recommended  every 2 years until 5 years of stability has been established. This recommendation follows the consensus statement: Guidelines for Management of Incidental Pulmonary Nodules Detected on CT Images: From the Fleischner Society 2017; Radiology 2017; 284:228-243. 3. Borderline enlarged mediastinal and right axillary lymph nodes. The appearance is nonspecific, and may be reactive in the setting of chronic CHF and interstitial lung disease. Metastatic disease would be difficult to exclude with a high degree of certainty. 4. Chronic fibrotic interstitial lung disease. 5. Hiatal hernia. 6. Aortic Atherosclerosis (ICD10-I70.0). Electronically Signed   By: Kerby Moors M.D.   On: 05/15/2021 10:04   CT Thoracic Spine Wo Contrast  Result Date: 05/16/2021 CLINICAL DATA:  Thoracic and lumbar spine pain after a fall. History of renal cell carcinoma. EXAM: CT THORACIC AND LUMBAR SPINE WITHOUT CONTRAST TECHNIQUE: Multidetector CT imaging of the thoracic and lumbar spine was performed without contrast. Multiplanar CT image reconstructions were also generated. RADIATION DOSE REDUCTION: This exam was performed according to the departmental dose-optimization program which includes automated exposure control, adjustment of the mA and/or kV according to patient size and/or use of iterative reconstruction technique. COMPARISON:  Chest CT 05/14/2021.  CT abdomen and pelvis 04/05/2021. FINDINGS: CT THORACIC SPINE FINDINGS Alignment: Normal. Vertebrae: No acute fracture or suspicious osseous lesion. T10 butterfly vertebra.  Paraspinal and other soft tissues: Hiatal hernia. Aortic and coronary atherosclerosis. Pacemaker. Cardiomegaly. Biapical pleuroparenchymal lung scarring and basilar predominant fibrotic interstitial lung disease as noted on the prior chest CT. Disc levels: Spondylosis with bridging vertebral osteophytes throughout the majority of the thoracic spine. Moderate disc and facet degeneration in the included lower cervical  spine. No evidence of high-grade spinal stenosis or neural foraminal stenosis in the thoracic spine. CT LUMBAR SPINE FINDINGS Segmentation: 5 lumbar type vertebrae. Alignment: Normal. Vertebrae: No acute fracture or suspicious osseous lesion. Paraspinal and other soft tissues: Partially visualized known right renal tumor. Small left renal cyst. Similar appearance of prominent subcentimeter retroperitoneal lymph nodes compared to the prior abdominal CT colonic diverticulosis. Abdominal aortic atherosclerosis without aneurysm. Disc levels: Moderate disc space narrowing from L3-4 to L5-S1. Advanced diffuse lumbar facet hypertrophy. Spinal stenosis is mild at L2-3, moderate at L3-4, and severe at L4-5. Mild multilevel neural foraminal stenosis. IMPRESSION: 1. No acute fracture or suspicious osseous lesion in the thoracic or lumbar spine. 2. Lumbar disc and facet degeneration with multilevel spinal stenosis, severe at L4-5. 3. Partially visualized known right renal tumor. 4. Aortic Atherosclerosis (ICD10-I70.0). Electronically Signed   By: Logan Bores M.D.   On: 05/16/2021 08:23   CT Lumbar Spine Wo Contrast  Result Date: 05/16/2021 CLINICAL DATA:  Thoracic and lumbar spine pain after a fall. History of renal cell carcinoma. EXAM: CT THORACIC AND LUMBAR SPINE WITHOUT CONTRAST TECHNIQUE: Multidetector CT imaging of the thoracic and lumbar spine was performed without contrast. Multiplanar CT image reconstructions were also generated. RADIATION DOSE REDUCTION: This exam was performed according to the departmental dose-optimization program which includes automated exposure control, adjustment of the mA and/or kV according to patient size and/or use of iterative reconstruction technique. COMPARISON:  Chest CT 05/14/2021.  CT abdomen and pelvis 04/05/2021. FINDINGS: CT THORACIC SPINE FINDINGS Alignment: Normal. Vertebrae: No acute fracture or suspicious osseous lesion. T10 butterfly vertebra. Paraspinal and other soft  tissues: Hiatal hernia. Aortic and coronary atherosclerosis. Pacemaker. Cardiomegaly. Biapical pleuroparenchymal lung scarring and basilar predominant fibrotic interstitial lung disease as noted on the prior chest CT. Disc levels: Spondylosis with bridging vertebral osteophytes throughout the majority of the thoracic spine. Moderate disc and facet degeneration in the included lower cervical spine. No evidence of high-grade spinal stenosis or neural foraminal stenosis in the thoracic spine. CT LUMBAR SPINE FINDINGS Segmentation: 5 lumbar type vertebrae. Alignment: Normal. Vertebrae: No acute fracture or suspicious osseous lesion. Paraspinal and other soft tissues: Partially visualized known right renal tumor. Small left renal cyst. Similar appearance of prominent subcentimeter retroperitoneal lymph nodes compared to the prior abdominal CT colonic diverticulosis. Abdominal aortic atherosclerosis without aneurysm. Disc levels: Moderate disc space narrowing from L3-4 to L5-S1. Advanced diffuse lumbar facet hypertrophy. Spinal stenosis is mild at L2-3, moderate at L3-4, and severe at L4-5. Mild multilevel neural foraminal stenosis. IMPRESSION: 1. No acute fracture or suspicious osseous lesion in the thoracic or lumbar spine. 2. Lumbar disc and facet degeneration with multilevel spinal stenosis, severe at L4-5. 3. Partially visualized known right renal tumor. 4. Aortic Atherosclerosis (ICD10-I70.0). Electronically Signed   By: Logan Bores M.D.   On: 05/16/2021 08:23   ECHOCARDIOGRAM COMPLETE  Result Date: 04/26/2021    ECHOCARDIOGRAM REPORT   Patient Name:   KEROLOS NEHME Date of Exam: 04/26/2021 Medical Rec #:  [id number]     Height:       71.0 in Accession #:    [id number]    Weight:  154.1 lb Date of Birth:  [birth year]    BSA:          1.887 m Patient Age:    89 years      BP:           110/52 mmHg Patient Gender: M             HR:           71 bpm. Exam Location:  [place] [facility] Procedure: 2D Echo, Cardiac  Doppler and Color Doppler Indications:    Z01.810 Pre-op exam  History:        Patient has prior history of Echocardiogram examinations, most                 recent 09/10/2019. Prior CABG and Pacemaker, Pre-op exam,                 Arrythmias:RBBB and Atrial Fibrillation; Risk                 Factors:Hypertension, Dyslipidemia and Former Smoker.  Sonographer:    Coralyn Helling RDCS Referring Phys: [id number] [place]  1. Compared with the echo 08/7320, systolic function is worse. Left ventricular ejection fraction, by estimation, is 25 to 30%. The left ventricle has severely decreased function. The left ventricle demonstrates global hypokinesis. The left ventricular internal cavity size was mildly dilated. There is mild concentric left ventricular hypertrophy. Left ventricular diastolic parameters are consistent with Grade II diastolic dysfunction (pseudonormalization). Elevated left atrial pressure.  2. Right ventricular systolic function is normal. The right ventricular size is normal. There is normal pulmonary artery systolic pressure.  3. Left atrial size was severely dilated.  4. Right atrial size was severely dilated.  5. The mitral valve is normal in structure. Mild mitral valve regurgitation. No evidence of mitral stenosis.  6. Tricuspid valve regurgitation is mild to moderate.  7. Aortic valve peak velocity and mean gradient are consistent iwth mild aortic stenosis. However, the dimensionless index is 0.35, consistent with moderate aortic stenosis. . The aortic valve is normal in structure. There is moderate calcification of the aortic valve. There is moderate thickening of the aortic valve. Aortic valve regurgitation is not visualized. Moderate aortic valve stenosis. Aortic valve mean gradient measures 11.0 mmHg. Aortic valve Vmax measures 2.16 m/s.  8. The inferior vena cava is normal in size with greater than 50% respiratory variability, suggesting right atrial pressure of 3 mmHg. FINDINGS   Left Ventricle: Compared with the echo 0/2542, systolic function is worse. Left ventricular ejection fraction, by estimation, is 25 to 30%. The left ventricle has severely decreased function. The left ventricle demonstrates global hypokinesis. The left ventricular internal cavity size was mildly dilated. There is mild concentric left ventricular hypertrophy. Left ventricular diastolic parameters are consistent with Grade II diastolic dysfunction (pseudonormalization). Elevated left atrial pressure. Right Ventricle: The right ventricular size is normal. No increase in right ventricular wall thickness. Right ventricular systolic function is normal. There is normal pulmonary artery systolic pressure. The tricuspid regurgitant velocity is 2.73 m/s, and  with an assumed right atrial pressure of 3 mmHg, the estimated right ventricular systolic pressure is 70.6 mmHg. Left Atrium: Left atrial size was severely dilated. Right Atrium: Right atrial size was severely dilated. Pericardium: Trivial pericardial effusion is present. Mitral Valve: The mitral valve is normal in structure. Mild mitral valve regurgitation. No evidence of mitral valve stenosis. Tricuspid Valve: The tricuspid valve is normal in structure. Tricuspid valve regurgitation is mild to  moderate. No evidence of tricuspid stenosis. Aortic Valve: Aortic valve peak velocity and mean gradient are consistent iwth mild aortic stenosis. However, the dimensionless index is 0.35, consistent with moderate aortic stenosis. The aortic valve is normal in structure. There is moderate calcification of the aortic valve. There is moderate thickening of the aortic valve. Aortic valve regurgitation is not visualized. Moderate aortic stenosis is present. Aortic valve mean gradient measures 11.0 mmHg. Aortic valve peak gradient measures 18.6 mmHg. Pulmonic Valve: The pulmonic valve was normal in structure. Pulmonic valve regurgitation is trivial. No evidence of pulmonic stenosis.  Aorta: The aortic root is normal in size and structure. Venous: The inferior vena cava is normal in size with greater than 50% respiratory variability, suggesting right atrial pressure of 3 mmHg. IAS/Shunts: No atrial level shunt detected by color flow Doppler. Additional Comments: A device lead is visualized.  LEFT VENTRICLE PLAX 2D LVIDd:         5.70 cm Diastology LVIDs:         4.80 cm LV e' medial:   8.05 cm/s LV PW:         1.10 cm LV E/e' medial: 18.4 LV IVS:        1.30 cm  RIGHT VENTRICLE            IVC TAPSE (M-mode): 0.9 cm     IVC diam: 1.60 cm RVSP:           32.8 mmHg LEFT ATRIUM              Index        RIGHT ATRIUM           Index LA diam:        5.70 cm  3.02 cm/m   RA Pressure: 3.00 mmHg LA Vol (A2C):   107.0 ml 56.69 ml/m  RA Area:     23.60 cm LA Vol (A4C):   108.0 ml 57.22 ml/m  RA Volume:   75.10 ml  39.79 ml/m LA Biplane Vol: 114.0 ml 60.40 ml/m  AORTIC VALVE AV Vmax:           215.50 cm/s AV Vmean:          156.500 cm/s AV VTI:            0.399 m AV Peak Grad:      18.6 mmHg AV Mean Grad:      11.0 mmHg LVOT Vmax:         81.10 cm/s LVOT Vmean:        55.300 cm/s LVOT VTI:          0.139 m LVOT/AV VTI ratio: 0.35  AORTA Ao Asc diam: 3.20 cm MITRAL VALVE                TRICUSPID VALVE MV Area (PHT): 5.42 cm     TR Peak grad:   29.8 mmHg MV Decel Time: 140 msec     TR Vmax:        273.00 cm/s MV E velocity: 148.00 cm/s  Estimated RAP:  3.00 mmHg                             RVSP:           32.8 mmHg                              SHUNTS  Systemic VTI: 0.14 m [name] MD Electronically signed by [name] MD Signature Date/Time: 04/26/2021/2:56:50 PM    Final     Labs:  CBC: Recent Labs    05/16/20 1045 02/06/21 1356 03/05/21 1604 05/16/21 0756  WBC 8.4 5.6 WILL FOLLOW 7.0  HGB 12.0* 12.2* WILL FOLLOW 12.2*  HCT 35.4* 37.0* WILL FOLLOW 37.1*  PLT 167 195 WILL FOLLOW 161    COAGS: Recent Labs    05/16/20 1045 05/16/21 0913  INR  2.2* 1.0    BMP: Recent Labs    05/16/20 1045 07/25/20 1510 02/06/21 1356 03/05/21 1604 05/13/21 1044 05/16/21 0756  NA 140   < > 138 137 141 137  K 3.9   < > 4.3 3.8 3.9 3.7  CL 102   < > 100 98 101 103  CO2 23   < > 23 25 22 26   GLUCOSE 141*   < > 94 90 130* 109*  BUN 34*   < > 26 26 22  33*  CALCIUM 9.4   < > 8.9 9.2 9.5 8.6*  CREATININE 1.75*   < > 1.33* 1.50* 1.40* 1.29*  GFRNONAA 37*  --   --   --   --  53*   < > = values in this interval not displayed.    LIVER FUNCTION TESTS: Recent Labs    05/16/20 1045 08/28/20 1436 02/06/21 1356 03/05/21 1604  BILITOT 0.7 0.5 0.5 0.4  AST 27 38 41* 42*  ALT 24 45* 42 45*  ALKPHOS 39 55 52 58  PROT 7.9 7.3 7.0 7.5  ALBUMIN 3.9 4.3 3.9 4.0    TUMOR MARKERS: No results for input(s): AFPTM, CEA, CA199, CHROMGRNA in the last 8760 hours.  Assessment and Plan: Patient with past medical history of CAD, HTN, anxiety, back pain presents with complaint of known right renal mass suspicious for renal cell carcinoma.  IR consulted for right renal mass biopsy at the request of Dr. Abner Greenspan. Case reviewed by Dr. Pascal Lux who approves patient for procedure.  Patient presents today in their usual state of health.  He has been NPO and is not currently on blood thinners.   Risks and benefits of biopsy was discussed with the patient and/or patient's family including, but not limited to bleeding, infection, damage to adjacent structures or low yield requiring additional tests.  All of the questions were answered and there is agreement to proceed.  Consent signed and in chart.  Thank you for this interesting consult.  I greatly enjoyed meeting COLEY KULIKOWSKI and look forward to participating in their care.  A copy of this report was sent to the requesting provider on this date.  Electronically Signed: Docia Barrier, PA 05/16/2021, 9:51 AM   I spent a total of 40 Minutes    in face to face in clinical consultation, greater than 50% of  which was counseling/coordinating care for right renal mass.

## 2021-05-16 NOTE — ED Notes (Signed)
Pt transported to US

## 2021-05-16 NOTE — ED Notes (Signed)
ED Provider at bedside. 

## 2021-05-17 LAB — SURGICAL PATHOLOGY

## 2021-05-21 ENCOUNTER — Ambulatory Visit (INDEPENDENT_AMBULATORY_CARE_PROVIDER_SITE_OTHER): Payer: Medicare HMO

## 2021-05-21 ENCOUNTER — Ambulatory Visit: Payer: Medicare HMO | Admitting: Cardiology

## 2021-05-21 DIAGNOSIS — R001 Bradycardia, unspecified: Secondary | ICD-10-CM | POA: Diagnosis not present

## 2021-05-21 DIAGNOSIS — D49511 Neoplasm of unspecified behavior of right kidney: Secondary | ICD-10-CM | POA: Diagnosis not present

## 2021-05-21 LAB — CUP PACEART REMOTE DEVICE CHECK
Date Time Interrogation Session: 20230124080654
Implantable Lead Implant Date: 20220124
Implantable Lead Implant Date: 20220124
Implantable Lead Location: 753859
Implantable Lead Location: 753860
Implantable Lead Model: 377
Implantable Lead Model: 377
Implantable Lead Serial Number: 8000155840
Implantable Lead Serial Number: 8000166398
Implantable Pulse Generator Implant Date: 20220124
Pulse Gen Model: 407145
Pulse Gen Serial Number: 70002016

## 2021-05-22 ENCOUNTER — Other Ambulatory Visit: Payer: Self-pay

## 2021-05-22 DIAGNOSIS — I10 Essential (primary) hypertension: Secondary | ICD-10-CM

## 2021-05-23 ENCOUNTER — Encounter: Payer: Self-pay | Admitting: Cardiology

## 2021-05-23 DIAGNOSIS — I130 Hypertensive heart and chronic kidney disease with heart failure and stage 1 through stage 4 chronic kidney disease, or unspecified chronic kidney disease: Secondary | ICD-10-CM | POA: Diagnosis not present

## 2021-05-23 DIAGNOSIS — C786 Secondary malignant neoplasm of retroperitoneum and peritoneum: Secondary | ICD-10-CM | POA: Diagnosis not present

## 2021-05-23 DIAGNOSIS — I2510 Atherosclerotic heart disease of native coronary artery without angina pectoris: Secondary | ICD-10-CM | POA: Diagnosis not present

## 2021-05-23 DIAGNOSIS — D631 Anemia in chronic kidney disease: Secondary | ICD-10-CM | POA: Diagnosis not present

## 2021-05-23 DIAGNOSIS — C641 Malignant neoplasm of right kidney, except renal pelvis: Secondary | ICD-10-CM | POA: Diagnosis not present

## 2021-05-23 DIAGNOSIS — I739 Peripheral vascular disease, unspecified: Secondary | ICD-10-CM | POA: Diagnosis not present

## 2021-05-23 DIAGNOSIS — I5022 Chronic systolic (congestive) heart failure: Secondary | ICD-10-CM | POA: Diagnosis not present

## 2021-05-23 DIAGNOSIS — N1831 Chronic kidney disease, stage 3a: Secondary | ICD-10-CM | POA: Diagnosis not present

## 2021-05-23 DIAGNOSIS — G629 Polyneuropathy, unspecified: Secondary | ICD-10-CM | POA: Diagnosis not present

## 2021-05-23 MED ORDER — POTASSIUM CHLORIDE ER 10 MEQ PO TBCR: 10.0000 meq | EXTENDED_RELEASE_TABLET | Freq: Every day | ORAL | 3 refills | Status: DC

## 2021-05-24 ENCOUNTER — Other Ambulatory Visit: Payer: Self-pay

## 2021-05-24 ENCOUNTER — Other Ambulatory Visit: Payer: Medicare HMO

## 2021-05-24 ENCOUNTER — Telehealth: Payer: Self-pay | Admitting: Oncology

## 2021-05-24 DIAGNOSIS — Z515 Encounter for palliative care: Secondary | ICD-10-CM

## 2021-05-24 NOTE — Telephone Encounter (Signed)
Called patient regarding upcoming 01/30 appointment, patient is notified.

## 2021-05-26 DIAGNOSIS — I130 Hypertensive heart and chronic kidney disease with heart failure and stage 1 through stage 4 chronic kidney disease, or unspecified chronic kidney disease: Secondary | ICD-10-CM | POA: Diagnosis not present

## 2021-05-26 DIAGNOSIS — N1831 Chronic kidney disease, stage 3a: Secondary | ICD-10-CM | POA: Diagnosis not present

## 2021-05-26 DIAGNOSIS — I5033 Acute on chronic diastolic (congestive) heart failure: Secondary | ICD-10-CM | POA: Diagnosis not present

## 2021-05-26 DIAGNOSIS — E785 Hyperlipidemia, unspecified: Secondary | ICD-10-CM | POA: Diagnosis not present

## 2021-05-27 ENCOUNTER — Inpatient Hospital Stay: Payer: Medicare HMO | Admitting: Oncology

## 2021-05-27 ENCOUNTER — Other Ambulatory Visit: Payer: Self-pay

## 2021-05-27 VITALS — BP 89/54 | HR 78 | Temp 97.9°F | Resp 15 | Ht 70.0 in | Wt 148.3 lb

## 2021-05-27 DIAGNOSIS — C649 Malignant neoplasm of unspecified kidney, except renal pelvis: Secondary | ICD-10-CM | POA: Diagnosis not present

## 2021-05-27 DIAGNOSIS — I700 Atherosclerosis of aorta: Secondary | ICD-10-CM | POA: Diagnosis not present

## 2021-05-27 DIAGNOSIS — C641 Malignant neoplasm of right kidney, except renal pelvis: Secondary | ICD-10-CM | POA: Diagnosis not present

## 2021-05-27 DIAGNOSIS — J849 Interstitial pulmonary disease, unspecified: Secondary | ICD-10-CM | POA: Diagnosis not present

## 2021-05-27 DIAGNOSIS — Z79899 Other long term (current) drug therapy: Secondary | ICD-10-CM | POA: Diagnosis not present

## 2021-05-27 DIAGNOSIS — I509 Heart failure, unspecified: Secondary | ICD-10-CM | POA: Diagnosis not present

## 2021-05-27 DIAGNOSIS — K449 Diaphragmatic hernia without obstruction or gangrene: Secondary | ICD-10-CM | POA: Diagnosis not present

## 2021-05-27 DIAGNOSIS — R590 Localized enlarged lymph nodes: Secondary | ICD-10-CM | POA: Diagnosis not present

## 2021-05-27 DIAGNOSIS — Z880 Allergy status to penicillin: Secondary | ICD-10-CM | POA: Diagnosis not present

## 2021-05-27 DIAGNOSIS — M48061 Spinal stenosis, lumbar region without neurogenic claudication: Secondary | ICD-10-CM | POA: Diagnosis not present

## 2021-05-27 DIAGNOSIS — M549 Dorsalgia, unspecified: Secondary | ICD-10-CM | POA: Diagnosis not present

## 2021-05-27 NOTE — Progress Notes (Signed)
Hematology and Oncology Follow Up Visit  Ian Cummings [id number] [birth year] 88 y.o. 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  Biopsy of the kidney in January 2023 confirmed clear-cell histology.  Prior Therapy: Status post kidney biopsy obtained on May 16, 2021.  Current therapy: Under evaluation for additional therapy.  Interim History: Ian Cummings returns today for a follow-up visit.  Since the last visit, he continues to report further decline in his performance status and energy.  He is limited in his mobility and for the most part using wheelchair and occasionally a walker inside his home.  He is not losing weight and his blood pressure is low.  His pain is currently manageable.  He denies any chest pain or difficulty breathing.  He denies any constipation.     Medications: I have reviewed the patient's current medications.  Current Outpatient Medications  Medication Sig Dispense Refill   acetaminophen (TYLENOL) 500 MG tablet Take 1,000 mg by mouth every 6 (six) hours as needed for moderate pain.     allopurinol (ZYLOPRIM) 100 MG tablet Take 100 mg by mouth daily.      amiodarone (PACERONE) 200 MG tablet Take 0.5 tablets (100 mg total) by mouth daily. 45 tablet 3   clopidogrel (PLAVIX) 75 MG tablet Take 1 tablet (75 mg total) by mouth daily. (Patient not taking: Reported on 05/16/2021)     doxycycline (VIBRAMYCIN) 100 MG capsule Take 1 capsule by mouth in the morning and at bedtime. (Patient not taking: Reported on 05/16/2021)     escitalopram (LEXAPRO) 10 MG tablet Take 10 mg by mouth daily.     furosemide (LASIX) 40 MG tablet Take 40 mg by mouth daily.     lansoprazole (PREVACID) 30 MG capsule Take 30 mg by mouth daily.     levothyroxine (SYNTHROID) 112 MCG tablet Take 112 mcg by mouth  daily before breakfast.     levothyroxine (SYNTHROID) 125 MCG tablet Take 125 mcg by mouth daily. (Patient not taking: Reported on 05/16/2021)     lidocaine (LIDODERM) 5 % Place 1 patch onto the skin daily. Remove & Discard patch within 12 hours or as directed by MD 30 patch 0   MELATONIN PO Take 1 tablet by mouth at bedtime as needed (sleep). Unknown strength     Multiple Vitamin (MULTIVITAMIN WITH MINERALS) TABS tablet Take 1 tablet by mouth daily. Unknown strength     nitroGLYCERIN (NITROSTAT) 0.4 MG SL tablet Place 0.4 mg under the tongue every 5 (five) minutes as needed for chest pain.      oxyCODONE (ROXICODONE) 5 MG immediate release tablet Take 1 tablet (5 mg total) by mouth every 4 (four) hours as needed for severe pain. 28 tablet 0   potassium chloride (KLOR-CON) 10 MEQ tablet Take 1 tablet (10 mEq total) by mouth daily. 90 tablet 3   predniSONE (DELTASONE) 10 MG tablet Take 10 mg by mouth in the morning and at bedtime. For 5 days starting 05/13/2020 (Patient not taking: Reported on 05/16/2021)     rosuvastatin (CRESTOR) 40 MG tablet Take 40 mg by mouth every other day.     sacubitril-valsartan (ENTRESTO) 24-26 MG Take 1 tablet by mouth 2 (two) times daily. 180 tablet 3   tamsulosin (FLOMAX) 0.4 MG CAPS capsule Take 0.4 mg by mouth at bedtime.     No  current facility-administered medications for this visit.     Allergies:  Allergies  Allergen Reactions   Atorvastatin     myalgia   Xarelto [Rivaroxaban]     Blood in urine    Amoxicillin Rash   Metoprolol Itching    When out in the sun     Physical Exam: Blood pressure (!) 89/54, pulse 78, temperature 97.9 F (36.6 C), temperature source Temporal, resp. rate 15, height 5\' 10"  (1.778 m), weight 148 lb 4.8 oz (67.3 kg), SpO2 98 %.  ECOG: 2    General appearance: Comfortable appearing without any discomfort Head: Normocephalic without any trauma Oropharynx: Mucous membranes are moist and pink without any thrush or  ulcers. Eyes: Pupils are equal and round reactive to light. Lymph nodes: No cervical, supraclavicular, inguinal or axillary lymphadenopathy.   Heart:regular rate and rhythm.  S1 and S2 without leg edema. Lung: Clear without any rhonchi or wheezes.  No dullness to percussion. Abdomin: Soft, nontender, nondistended with good bowel sounds.  No hepatosplenomegaly. Musculoskeletal: No joint deformity or effusion.  Full range of motion noted. Neurological: No deficits noted on motor, sensory and deep tendon reflex exam. Skin: No petechial rash or dryness.  Appeared moist.      Lab Results: Lab Results  Component Value Date   WBC 7.0 05/16/2021   HGB 12.2 (L) 05/16/2021   HCT 37.1 (L) 05/16/2021   MCV 91.6 05/16/2021   PLT 161 05/16/2021     Chemistry      Component Value Date/Time   NA 137 05/16/2021 0756   NA 141 05/13/2021 1044   K 3.7 05/16/2021 0756   CL 103 05/16/2021 0756   CO2 26 05/16/2021 0756   BUN 33 (H) 05/16/2021 0756   BUN 22 05/13/2021 1044   CREATININE 1.29 (H) 05/16/2021 0756      Component Value Date/Time   CALCIUM 8.6 (L) 05/16/2021 0756   ALKPHOS 58 03/05/2021 1604   AST 42 (H) 03/05/2021 1604   ALT 45 (H) 03/05/2021 1604   BILITOT 0.4 03/05/2021 1604      IMPRESSION: 1. No specific findings identified to suggest metastatic disease to the chest. 2. There is a nonspecific, 6 mm none solid nodule within the periphery of the right upper lobe. Initial follow-up with CT at 6-12 months is recommended to confirm persistence. If persistent, repeat CT is recommended every 2 years until 5 years of stability has been established. This recommendation follows the consensus statement: Guidelines for Management of Incidental Pulmonary Nodules Detected on CT Images: From the Fleischner Society 2017; Radiology 2017; 284:228-243. 3. Borderline enlarged mediastinal and right axillary lymph nodes. The appearance is nonspecific, and may be reactive in the setting  of chronic CHF and interstitial lung disease. Metastatic disease would be difficult to exclude with a high degree of certainty. 4. Chronic fibrotic interstitial lung disease. 5. Hiatal hernia. 6. Aortic Atherosclerosis (ICD10-I70.0).  IMPRESSION: 1. No acute fracture or suspicious osseous lesion in the thoracic or lumbar spine. 2. Lumbar disc and facet degeneration with multilevel spinal stenosis, severe at L4-5. 3. Partially visualized known right renal tumor. 4. Aortic Atherosclerosis (ICD10-I70.0).    Impression and Plan:  88 year old with:  1.  Stage IV clear-cell renal cell carcinoma initially presented with a right kidney mass in 2021 and developed retroperitoneal lymphadenopathy.  Biopsy obtained in January 2023 showed clear-cell histology.  His disease status was updated at this time and treatment choices were reviewed.  CT scan of the chest as well as of  the spine were personally reviewed and showed no evidence of metastatic disease.  He does have retroperitoneal adenopathy indicating stage IV disease.  Treatment choices including continued active surveillance and transitioning into supportive and palliative care versus systemic therapy options.  These options include immunotherapy, oral targeted therapy or combination of the above.  After discussion today, his performance status is poor and declining and not a candidate for anticancer treatment.  He is currently under palliative care services and likely will transition into hospice in the near future.  I believe that his prognosis is overall poor due to multiple comorbid conditions and definitely related to cancer progression.  2.  Prognosis and goals of care: His prognosis is poor with limited life expectancy and would benefit from transitioning into hospice.   3.  Back pain: Unrelated to malignancy and currently prescribed pain medication.   4.  Follow-up: I'm happy to see him in the future as needed.    30  minutes  were spent on this encounter.  Time was dedicated to reviewing pathology results, imaging studies, treatment choices and future plan of care review.    Zola Button, MD 1/30/20239:23 AM

## 2021-05-28 ENCOUNTER — Telehealth: Payer: Self-pay | Admitting: Cardiology

## 2021-05-28 DIAGNOSIS — C786 Secondary malignant neoplasm of retroperitoneum and peritoneum: Secondary | ICD-10-CM | POA: Insufficient documentation

## 2021-05-28 NOTE — Progress Notes (Signed)
PATIENT NAME: Ian Cummings DOB: 03-03-1929 MRN: [id number]  PRIMARY CARE PROVIDER: Ginger Organ., MD  RESPONSIBLE PARTY:  Acct ID - Guarantor Home Phone Work Phone Relationship Acct Type  [id number] Delight [phone]  Self P/F     [place], [place], [place] [address]    COMMUNITY PALLIATIVE CARE RN NOTE    PLAN OF CARE and INTERVENTION:  ADVANCE CARE PLANNING/GOALS OF CARE: Safety, to remain in her home, comfort PATIENT/CAREGIVER EDUCATION: Palliative care, pain management DISEASE STATUS:  RN Palliative care telephonic encounter completed today with patient/wife. Received another Palliative care referral from PCP. Patient stated he has increased pain that caused patient to go to ED for evaluation. Patient was unable to walk due to pain. He is hesitant to take pain medication because he is fearful that he will run out. Patient had a CT scan done. Provided education to take medication as needed per directions from MD to decrease pain. Patient's wife shared that patient has not been ambulatory due to the pain. Encouraged patient to utilize pain medications so he will be able to move as needed. Patient and wife expressed understanding. Patient has an appointment to follow up with Dr. Alen Blew. Plan is for Palliative NP to see patient.  HISTORY OF PRESENT ILLNESS: This is a 88 year old male with a diagnosis of cancer of kidney with metastatic disease. He has a history of CHF, PAD, A-fib, and CKD 3.Palliative care has been asked to follow for additional support, care and complex decision making.   CODE STATUS: DNR PPS: 50%   PHYSICAL EXAM: deferred    Duration: 64min       Dietrich Pates, BSN   Cornelius Moras, RN

## 2021-05-28 NOTE — Telephone Encounter (Signed)
Left message to call back  

## 2021-05-28 NOTE — Telephone Encounter (Signed)
Ian Cummings a Physical Therapist with [facility] is calling to report the patient's BP was 90/48 today. The patient is also reporting dizziness with standing which is limiting his mobility. The patient has stopped taking the additional 10 MG Lasix, but hypotension is still occurring.

## 2021-05-29 ENCOUNTER — Other Ambulatory Visit: Payer: Medicare HMO | Admitting: Family Medicine

## 2021-05-29 ENCOUNTER — Other Ambulatory Visit: Payer: Self-pay

## 2021-05-29 VITALS — BP 110/64 | HR 92 | Temp 98.2°F | Resp 18

## 2021-05-29 DIAGNOSIS — F32A Depression, unspecified: Secondary | ICD-10-CM

## 2021-05-29 DIAGNOSIS — I5022 Chronic systolic (congestive) heart failure: Secondary | ICD-10-CM

## 2021-05-29 DIAGNOSIS — Z515 Encounter for palliative care: Secondary | ICD-10-CM

## 2021-05-29 DIAGNOSIS — I10 Essential (primary) hypertension: Secondary | ICD-10-CM

## 2021-05-29 DIAGNOSIS — C649 Malignant neoplasm of unspecified kidney, except renal pelvis: Secondary | ICD-10-CM

## 2021-05-29 NOTE — Progress Notes (Signed)
[facility] Palliative Care Consult Note Telephone: [phone]  Fax: [phone]   Date of encounter: 05/29/21 11:21 AM PATIENT NAME: Ian Cummings [address] [place] [address]   [id number] (home)  DOB: 14-Jan-1929 MRN: [id number] PRIMARY CARE PROVIDER:    Ginger Organ., MD,  [place] [address] [phone]  REFERRING PROVIDER:   Ginger Organ., MD [address] [place],  [place] [address] [phone]  RESPONSIBLE PARTY:    Contact Information     Name Relation Home Work [place]   [phone]   [place], [place]   [phone]        I met face to face with patient and family in his home. Palliative Care was asked to follow this patient by consultation request of  Ginger Organ., MD to address advance care planning and complex medical decision making. This is the initial visit.          ASSESSMENT, SYMPTOM MANAGEMENT AND PLAN / RECOMMENDATIONS:   Palliative Care Encounter Introduced MOST form, pt to consider and discuss with family.  Has DNR Advised to allow son to help with shower in shower chair.   Palliative can cover pain meds.  Question if lidoderm patch can be used for additional management of back pain. Give scheduled Tylenol up to 2 tabs TID to help with pain relief.  2.  Hypertension Encouraged to increase hydration to max 1.5 L daily. Discuss diuretic with cardiology at upcoming appt.  Change positions slowly. Use assistive DME to increase stability, shower with assistance, seated. Notify PCP/Cardiologist if SBP < 100  3.  Metastatic clear cell renal carcinoma Has d/c'd Xarelto due to hematuria which resolved. Pain management per Palliative and as pt progresses will become eligible for hospice. Encouraged methods of energy conservation for tasks meaningful to him to spend with family, have friends from church visit to provide support.  4.  Chronic  systolic heart failure Continues on diuretic therapy. Daily weights. Advised pt to call Palliative if he has increased fatigue, SOB or any orthopnea/PND.   Can give O2, Morphine and Ativan if needed to avoid smothering sensation/anxiety.  5.  Depression Pt grieving his diagnosis and concerned about method of dying.  Reassured that Palliative can assist with symptom management. Offered limited answer about why immunotherapy may not have been offered previously based off information available on NIH/NCI website about treatment. Encouraged to resume listening to music and potentially Christian music which may offer comfort. Has friend who is retired Theme park manager that he is close to and encouraged him to visit when pt was up for visit. Consider SW referral for counseling to assist pt adjustment to diagnosis.    Follow up Palliative Care Visit: Palliative care will continue to follow for complex medical decision making, advance care planning, and clarification of goals. Return 4 weeks or prn.    This visit was coded based on medical decision making (MDM).  PPS: 50%  HOSPICE ELIGIBILITY/DIAGNOSIS: TBD  Chief Complaint:  [place] received a referral to follow up with patient for chronic disease management, advance directive and defining/refining goals of care.   HISTORY OF PRESENT ILLNESS:  Ian Cummings is a 88 y.o. year old male  with Stage IV clear cell renal carcinoma, CAD, arthritis, polyneuropathy, PAF, hx of stroke and chronic systolic CHF with left ventricular diastolic dysfunction.  His BP has been low and has been having severe back  pain as of 2 weeks and went by EMS to the hospital for pain control/renal biopsy.  Pain is level across from hips with sharp stabbing pain in spine when getting up to chair and move around, rates a 9/10 scale.  He was discovered to have stage IV clear cell renal carcinoma and per Oncologist, Dr Clydene Laming said he is not a candidate for  immune therapy. He is questioning why he was not previously offered immunotherapy when he may have been able to tolerate the treatment when first diagnosed.  Advised pt that it may have to do with staging of his cancer at the time when he was diagnosed as certain therapies are targeted for different stages of illness and encouraged him to discuss with Dr Clydene Laming. He had been independent, cooking and has been ambulatory independently. He was increased by Cardiologist to 2 Lasix per day but had low BP in 75T systolic and had to decrease. Denies orthopnea and PND, states he doesn't normally have edema with heart failure exacerbation but has dyspnea.  He is concerned about having the sensation of being unable to breathe in his final days.  No falls. Lightheadedness on standing. Mostly feels like he may pass out when ambulating to the bathroom. He is able to do sponge bathing and has a chair in the shower.  He is not confident that even with shower chair he is safe in the shower.  Appetite fair, thinks he is feeling better as he drank more today. Doing Boost and eating well. Weight is 148 lbs 4.8 ounces. Has a walker and borrowed a WC. Feels better today. He feels more fatigued. Stopped Xarelto a few months ago when he last had bleeding but denies any further hematuria at present.  Wife states that pt's son is arriving today from [place] and she said she doubted that pt would allow anyone but a male to help him in the shower.  Encouraged her to see if he would allow his son to stand by while he sat in the shower chair and showered. He advises that he likes music, is a strong "conservative" christian and usually enjoys word searches but lately has had little interest in any of it.    History obtained from review of EMR, interview with family and Mr. Shaff.  I reviewed available labs, medications, imaging, studies and related documents from the EMR.  Records reviewed and summarized above.   ROS General: NAD EYES:  denies vision changes ENMT: denies dysphagia Cardiovascular: denies chest pain, denies DOE Pulmonary: denies cough, denies increased SOB Abdomen: endorses fair appetite, denies constipation, endorses continence of bowel GU: denies dysuria, endorses continence of urine MSK:  endorses increased weakness with lightheadedness, no falls reported Skin: denies rashes or wounds Neurological:  endorses low lumbar back pain worsening with movement, denies insomnia, c/o lightheadedness on standing Psych: Endorses depressed mood Heme/lymph/immuno: denies bruises, abnormal bleeding  Physical Exam: Current and past weights: 148 lbs 4.8 ounces on home scale, On 02/06/21 weight 155 lbs Constitutional: NAD General: thin, appears fatigued EYES: anicteric sclera, lids intact, no discharge  ENMT: intact hearing, oral mucous membranes moist, dentition intact CV: S1S2, IRIR, no LE edema Pulmonary: CTAB, no increased work of breathing, no cough, room air Abdomen: normo-active BS + 4 quadrants, soft and non tender, no ascites, noted loud abdominal bruit GU: deferred MSK: no sarcopenia, moves all extremities, ambulatory Skin: warm and dry, no rashes or wounds on visible skin Neuro:  no generalized weakness, no cognitive impairment Psych: flat  affect, A and O x 3 Hem/lymph/immuno: no widespread bruising  CURRENT PROBLEM LIST:  Patient Active Problem List   Diagnosis Date Noted   Secondary malignant neoplasm of retroperitoneum and peritoneum ([place]) 05/28/2021   Pacemaker 05/13/2021   Kidney cancer, primary, with metastasis from kidney to other site [facility]) 05/08/2021   Acute on chronic congestive heart failure with left ventricular diastolic dysfunction ([place]) 03/19/2021   Anemia 03/19/2021   Encounter for general adult medical examination without abnormal findings 03/19/2021   Hypercoagulable state ([place]) 03/19/2021   Other specified disorders of kidney and ureter 73/71/0626   Chronic systolic heart failure  ([place]) 03/19/2021   Arteriosclerosis of coronary artery bypass graft 03/19/2021   Mixed dyslipidemia 03/19/2021   Stroke ([place])    PAF (paroxysmal atrial fibrillation) ([place])    Persistent atrial fibrillation ([place])    Neck pain 02/01/2020   TIA (transient ischemic attack)    Pre-diabetes    Peripheral vascular disease ([place])    Neuropathy    Hyperlipidemia    H/O hiatal hernia    GERD (gastroesophageal reflux disease)    Depression    Coronary artery disease    Colon polyps    Claudication ([place])    Carotid artery disease ([place])    CAD (coronary artery disease)    Arthritis    Anxiety    Anginal pain (HCC)    AAA (abdominal aortic aneurysm)    Hypo-osmolality and hyponatremia 10/05/2019   Unspecified rotator cuff tear or rupture of left shoulder, not specified as traumatic 10/05/2019   Chronic kidney disease, stage 3a ([place]) 09/10/2019   Chronic diastolic (congestive) heart failure ([place]) 09/10/2019   Unstable angina ([place]) 09/10/2019   Muscle pain 04/27/2019   Trochanteric bursitis of both hips 03/31/2019   Shoulder pain 01/27/2019   Cervical radiculopathy at C6 01/13/2019   RBBB 01/12/2019   Bradycardia 01/12/2019   Essential hypertension 01/12/2019   Hypertension    OSA on CPAP 02/17/2018   Ataxia 02/04/2018   Complex sleep apnea syndrome 11/11/2017   Bradycardia on ECG 11/11/2017   Malnutrition of moderate degree 08/05/2015   PAD (peripheral artery disease) ([place]) 08/03/2015   Mitral valve disorder 12/05/2014   Chronic kidney disease due to hypertension 12/05/2014   Conjunctival lesion 05/04/2014   Atherosclerotic PVD with intermittent claudication ([place]) 02/20/2014   Hypothyroidism    Coronary artery disease involving native coronary artery of native heart with unstable angina pectoris ([place]) 09/06/2012   s/p CABG    Mixed hyperlipidemia    Hypertension, renal disease, stage 1-4 or unspecified chronic kidney disease    Gout    MI (myocardial infarction) ([place]) 07/2012    Atherosclerosis of native arteries of the extremities with intermittent claudication 12/04/2011   Chorioretinitis 09/24/2011   Carotid artery disease without cerebral infarction [facility])    Benign prostatic hyperplasia with lower urinary tract symptoms 04/03/2010   Impaired fasting glucose 11/15/2009   Carotid artery occlusion 11/15/2009   Polyneuropathy 11/15/2009   Coronary angioplasty status 04/03/2009   Gastro-esophageal reflux disease without esophagitis 04/03/2009   Trochanteric bursitis of right hip 04/28/1958   PAST MEDICAL HISTORY:  Active Ambulatory Problems    Diagnosis Date Noted   Carotid artery disease without cerebral infarction [facility])    Atherosclerosis of native arteries of the extremities with intermittent claudication 12/04/2011   s/p CABG    Mixed hyperlipidemia    Hypertension, renal disease, stage 1-4 or unspecified chronic kidney disease    Coronary artery disease involving native coronary artery  of native heart with unstable angina pectoris ([place]) 09/06/2012   Gout    Hypothyroidism    Atherosclerotic PVD with intermittent claudication ([place]) 02/20/2014   PAD (peripheral artery disease) ([place]) 08/03/2015   Malnutrition of moderate degree 08/05/2015   Complex sleep apnea syndrome 11/11/2017   Bradycardia on ECG 11/11/2017   OSA on CPAP 02/17/2018   RBBB 01/12/2019   Bradycardia 01/12/2019   Essential hypertension 01/12/2019   Hypertension    Chronic kidney disease, stage 3a ([place]) 09/10/2019   Chronic diastolic (congestive) heart failure ([place]) 09/10/2019   Unstable angina ([place]) 09/10/2019   Conjunctival lesion 05/04/2014   Shoulder pain 01/27/2019   TIA (transient ischemic attack)    Pre-diabetes    Peripheral vascular disease (HCC)    Neuropathy    MI (myocardial infarction) ([place]) 07/2012   Hyperlipidemia    H/O hiatal hernia    GERD (gastroesophageal reflux disease)    Depression    Coronary artery disease    Colon polyps    Claudication (HCC)     Carotid artery disease ([place])    CAD (coronary artery disease)    Arthritis    Anxiety    Anginal pain (HCC)    AAA (abdominal aortic aneurysm)    Neck pain 02/01/2020   Persistent atrial fibrillation (HCC)    Stroke (HCC)    PAF (paroxysmal atrial fibrillation) ([place])    Cervical radiculopathy at C6 01/13/2019   Trochanteric bursitis of both hips 03/31/2019   Acute on chronic congestive heart failure with left ventricular diastolic dysfunction ([place]) 03/19/2021   Anemia 03/19/2021   Ataxia 02/04/2018   Benign prostatic hyperplasia with lower urinary tract symptoms 04/03/2010   Chorioretinitis 09/24/2011   Coronary angioplasty status 04/03/2009   Encounter for general adult medical examination without abnormal findings 03/19/2021   Hypercoagulable state ([place]) 03/19/2021   Hypo-osmolality and hyponatremia 10/05/2019   Impaired fasting glucose 11/15/2009   Mitral valve disorder 12/05/2014   Carotid artery occlusion 11/15/2009   Polyneuropathy 11/15/2009   Other specified disorders of kidney and ureter 03/19/2021   Trochanteric bursitis of right hip 04/28/1958   Unspecified rotator cuff tear or rupture of left shoulder, not specified as traumatic 10/05/2019   Muscle pain 98/33/8250   Chronic systolic heart failure ([place]) 03/19/2021   Arteriosclerosis of coronary artery bypass graft 03/19/2021   Gastro-esophageal reflux disease without esophagitis 04/03/2009   Chronic kidney disease due to hypertension 12/05/2014   Mixed dyslipidemia 03/19/2021   Kidney cancer, primary, with metastasis from kidney to other site [facility]) 05/08/2021   Pacemaker 05/13/2021   Secondary malignant neoplasm of retroperitoneum and peritoneum ([place]) 05/28/2021   Resolved Ambulatory Problems    Diagnosis Date Noted   Unstable angina pectoris ([place]) 09/06/2012   Elevated TSH 10/22/2012   PAF (paroxysmal atrial fibrillation) ([place])    Chest pain 01/26/2013   Aftercare following surgery of the circulatory system,  NEC 06/22/2013   Stroke [facility])    Past Medical History:  Diagnosis Date   BPH (benign prostatic hypertrophy)    SOCIAL HX:  Social History   Tobacco Use   Smoking status: Former    Types: Cigarettes    Quit date: 04/29/1975    Years since quitting: 46.1   Smokeless tobacco: Never  Substance Use Topics   Alcohol use: Yes    Alcohol/week: 3.0 standard drinks    Types: 3 Glasses of wine per week    Comment: 3 glasses of wine today   FAMILY HX:  Family History  Problem Relation Age of Onset   Heart disease Mother        After 34 yrs of age   Heart attack Mother    Hyperlipidemia Father    Hypertension Father    Heart disease Father        After 24 yrs of age   Diabetes Father    Stroke Sister    Colon cancer Neg Hx    Stomach cancer Neg Hx    Rectal cancer Neg Hx    Esophageal cancer Neg Hx    Liver disease Neg Hx    Kidney disease Neg Hx        Preferred Pharmacy: ALLERGIES:  Allergies  Allergen Reactions   Atorvastatin     myalgia   Xarelto [Rivaroxaban]     Blood in urine    Amoxicillin Rash   Metoprolol Itching    When out in the sun     PERTINENT MEDICATIONS:  Outpatient Encounter Medications as of 05/29/2021  Medication Sig   acetaminophen (TYLENOL) 500 MG tablet Take 1,000 mg by mouth every 6 (six) hours as needed for moderate pain.   allopurinol (ZYLOPRIM) 100 MG tablet Take 100 mg by mouth daily.    amiodarone (PACERONE) 200 MG tablet Take 0.5 tablets (100 mg total) by mouth daily.   clopidogrel (PLAVIX) 75 MG tablet Take 1 tablet (75 mg total) by mouth daily.   escitalopram (LEXAPRO) 10 MG tablet Take 10 mg by mouth daily.   furosemide (LASIX) 40 MG tablet Take 40 mg by mouth daily.   lansoprazole (PREVACID) 30 MG capsule Take 30 mg by mouth daily.   levothyroxine (SYNTHROID) 125 MCG tablet Take 125 mcg by mouth daily.   MELATONIN PO Take 1 tablet by mouth at bedtime as needed (sleep). Unknown strength   Multiple Vitamin (MULTIVITAMIN WITH MINERALS)  TABS tablet Take 1 tablet by mouth daily. Unknown strength   nitroGLYCERIN (NITROSTAT) 0.4 MG SL tablet Place 0.4 mg under the tongue every 5 (five) minutes as needed for chest pain.  (Patient not taking: Reported on 05/27/2021)   oxyCODONE (ROXICODONE) 5 MG immediate release tablet Take 1 tablet (5 mg total) by mouth every 4 (four) hours as needed for severe pain.   rosuvastatin (CRESTOR) 40 MG tablet Take 40 mg by mouth every other day.   sacubitril-valsartan (ENTRESTO) 24-26 MG Take 1 tablet by mouth 2 (two) times daily.   tamsulosin (FLOMAX) 0.4 MG CAPS capsule Take 0.4 mg by mouth at bedtime.   No facility-administered encounter medications on file as of 05/29/2021.     ---------------------------------------------------------------------------------------------------------------------------------------------------------------------------------------------------------------------------- Advance Care Planning/Goals of Care: Goals include to maximize quality of life and symptom management. Patient gave limited permission to discuss.Our advance care planning conversation included a discussion about:    The value and importance of advance care planning-has [facility] POA Exploration of personal, cultural or spiritual beliefs that might influence medical decisions-strong conservative christian beliefs, questions missed opportunities for treatment of his cancer at an earlier, more healthy stage of life Exploration of goals of care in the event of a sudden injury or illness-wants to remain in his home and be kept comfortable Identification of a healthcare agent  Provided MOST form for him to review with wife and son, will address at a later date CODE STATUS: DNR    Thank you for the opportunity to participate in the care of Mr. Portela.  The palliative care team will continue to follow. Please call our office at [phone] if we can be  of additional assistance.   Marijo Conception, FNP-C  COVID-19  PATIENT SCREENING TOOL Asked and negative response unless otherwise noted:  Have you had symptoms of covid, tested positive or been in contact with someone with symptoms/positive test in the past 5-10 days? no

## 2021-05-29 NOTE — Telephone Encounter (Signed)
Left message for Elmyra Ricks to call back

## 2021-05-29 NOTE — Telephone Encounter (Signed)
° °  Ian Cummings is returning call

## 2021-05-30 ENCOUNTER — Telehealth: Payer: Self-pay | Admitting: Cardiology

## 2021-05-30 NOTE — Telephone Encounter (Signed)
Called patient to get more information regarding his symptoms and sent message to Dr. Agustin Cree for his recommendations for the patient.

## 2021-05-30 NOTE — Telephone Encounter (Signed)
Pt c/o BP issue: STAT if pt c/o blurred vision, one-sided weakness or slurred speech  1. What are your last 5 BP readings? 117/60 sitting , 85/45 standing  2. Are you having any other symptoms (ex. Dizziness, headache, blurred vision, passed out)? Dizziness standing   3. What is your BP issue? Blood pressure is low

## 2021-05-31 ENCOUNTER — Other Ambulatory Visit: Payer: Self-pay

## 2021-05-31 ENCOUNTER — Ambulatory Visit: Payer: Medicare HMO | Admitting: Cardiology

## 2021-05-31 VITALS — Ht 70.0 in | Wt 146.0 lb

## 2021-05-31 DIAGNOSIS — I5022 Chronic systolic (congestive) heart failure: Secondary | ICD-10-CM

## 2021-05-31 DIAGNOSIS — I739 Peripheral vascular disease, unspecified: Secondary | ICD-10-CM

## 2021-05-31 DIAGNOSIS — I25110 Atherosclerotic heart disease of native coronary artery with unstable angina pectoris: Secondary | ICD-10-CM

## 2021-05-31 DIAGNOSIS — I059 Rheumatic mitral valve disease, unspecified: Secondary | ICD-10-CM

## 2021-05-31 NOTE — Progress Notes (Signed)
Remote pacemaker transmission.   

## 2021-05-31 NOTE — Telephone Encounter (Signed)
Patient seen by Dr. Agustin Cree in the clinic today and orders were reviewed with the patient.

## 2021-05-31 NOTE — Patient Instructions (Signed)
Medication Instructions:  Your physician has recommended you make the following change in your medication:   STOP: Flomax  *If you need a refill on your cardiac medications before your next appointment, please call your pharmacy*   Lab Work: Your physician recommends that you return for lab work in: Labs today: BMP  If you have labs (blood work) drawn today and your tests are completely normal, you will receive your results only by: [place] (if you have [place]) OR A paper copy in the mail If you have any lab test that is abnormal or we need to change your treatment, we will call you to review the results.   Testing/Procedures: None   Follow-Up: At [facility], you and your health needs are our priority.  As part of our continuing mission to provide you with exceptional heart care, we have created designated Provider Care Teams.  These Care Teams include your primary Cardiologist (physician) and Advanced Practice Providers (APPs -  Physician Assistants and Nurse Practitioners) who all work together to provide you with the care you need, when you need it.  We recommend signing up for the patient portal called "MyChart".  Sign up information is provided on this After Visit Summary.  MyChart is used to connect with patients for Virtual Visits (Telemedicine).  Patients are able to view lab/test results, encounter notes, upcoming appointments, etc.  Non-urgent messages can be sent to your provider as well.   To learn more about what you can do with MyChart, go to [email or web address].    Your next appointment:   February 21st  2023  The format for your next appointment:   In Person  Provider:   Jenne Campus, MD    Other Instructions Stop lasix for two days, then after two days go back on lasix 40 mg daily.

## 2021-05-31 NOTE — Progress Notes (Signed)
Cardiology Office Note:    Date:  05/31/2021   ID:  Ian Cummings, DOB 1929-02-21, MRN [id number]  PCP:  Ginger Organ., MD  Cardiologist:  Jenne Campus, MD    Referring MD: Ginger Organ., MD   Chief Complaint  Patient presents with   Low Blood Pressure    Ongoing for 1 week     History of Present Illness:    Ian Cummings is a 88 y.o. male  with past medical history significant for coronary artery disease.  He does have remote coronary artery bypass graft he did have acute coronary syndrome in July 2021 with both grafts occluded and severe native CAD not amendable for any future revascularization, paroxysmal atrial fibrillation maintaining sinus rhythm on amiodarone, symptomatic bradycardia with sinus pauses required permanent pacemaker, he is chronically anticoagulated, hypertensive heart disease, cardiomyopathy with ejection fraction 25 to 30%, chronic kidney failure.  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 He requested to be seen because he feels not good.  Last time I did see him we did proBNP which was significantly elevated dose of diuretic has been increased from 40 mg furosemide daily to 40 mg twice daily.  After that he started feeling better but still described to have dizziness when he is getting up on top of that he is clearly orthostatic on the physical exam today.  There is no swelling of lower extremities he denies have any shortness of breath just fatigue tiredness and dizziness when getting up.  Past Medical History:  Diagnosis Date   AAA (abdominal aortic aneurysm)    2004, Pt stated not there now   Anginal pain ([place])    Anxiety    Arthritis    Atherosclerosis of native arteries of the extremities with intermittent claudication 12/04/2011   ABI .21 and .54 in 2012    Atherosclerotic PVD with intermittent claudication ([place]) 02/20/2014   BPH (benign prostatic hypertrophy)    Bradycardia 01/12/2019   Bradycardia on ECG  11/11/2017   CAD (coronary artery disease)    Carotid artery disease ([place])    Right carotid stent 2013   Carotid artery disease without cerebral infarction [facility])    S/p right carotid stent for asymptomatic disease  2013    Chronic diastolic (congestive) heart failure ([place]) 09/10/2019   Chronic kidney disease, stage 3a ([place]) 09/10/2019   Claudication ([place])    Colon polyps    adenomatous   Complex sleep apnea syndrome 11/11/2017   Conjunctival lesion 05/04/2014   Formatting of this note might be different from the original. left   Coronary artery disease    Coronary artery disease involving native coronary artery of native heart with unstable angina pectoris ([place]) 09/06/2012   CABG w LIMA to LAD, SVG to OM1-OM2, SVG to dx, SVG to PD/PL 1995,  Cypher stent x 2 SVG to RCA and Cypher stent to Circ Graft 05/13/07, stent of SVG to circ OM in [place] in July 2013  Cath 4/14 [facility] normal Left main, occluded mid LAD, occluded RCA SVG, occluded CFX, widely patent OM 1 SVG, occluded RCA SVG, occluded Diag 1 SVG, widely patent LAD LIMA graft;  Redo CABG Dr. Servando Snare  10/18/12 with    Depression    Essential hypertension 01/12/2019   GERD (gastroesophageal reflux disease)    Gout    H/O hiatal hernia    Hyperlipidemia    Hypertension    Hypertension, renal disease, stage 1-4 or unspecified chronic  kidney disease    Hypothyroidism    Malnutrition of moderate degree 08/05/2015   MI (myocardial infarction) ([place]) 4/14   Mixed hyperlipidemia    Neuropathy    Hx: of in B/L toes   OSA on CPAP 02/17/2018   PAD (peripheral artery disease) ([place]) 08/03/2015   PAF (paroxysmal atrial fibrillation) ([place])    Peripheral vascular disease ([place])    with claudication   Pre-diabetes    RBBB 01/12/2019   s/p CABG    Shoulder pain 01/27/2019   Stroke ([place])    TIA (transient ischemic attack)    Hx: of   Unstable angina ([place]) 09/10/2019    Past Surgical History:  Procedure Laterality Date   CARDIOVERSION N/A 02/22/2020    Procedure: CARDIOVERSION;  Surgeon: [name], MD;  Location: [place];  Service: Cardiovascular;  Laterality: N/A;   CAROTID STENT INSERTION Right 2013   right at [place] EXTRACTION Bilateral    Hx: of both eyes   COLONOSCOPY     Hx: of   COLONOSCOPY     CORONARY ANGIOPLASTY WITH STENT PLACEMENT  2010   CORONARY ARTERY BYPASS GRAFT  1995   CORONARY ARTERY BYPASS GRAFT N/A 10/18/2012   Procedure: REDO CORONARY ARTERY BYPASS GRAFTING (CABG);  Surgeon: Grace Isaac, MD;  Location: [place];  Service: Open Heart Surgery;  Laterality: N/A;  off pump times two using endoscopically harvested left saphenous vein   CORONARY STENT PLACEMENT  06/2016   ENDARTERECTOMY FEMORAL Right 08/03/2015   Procedure: RIGHT FEMORAL ENDARTERECTOMY WITH PATCH ANGIOPLASTY;  Surgeon: Serafina Mitchell, MD;  Location: [facility];  Service: Vascular;  Laterality: Right;   FEMORAL-POPLITEAL BYPASS GRAFT Right 08/03/2015   Procedure: RIGHT FEMORAL-BELOW KNEE POPLITEAL ARTERY BYPASS GRAFT;  Surgeon: Serafina Mitchell, MD;  Location: [place];  Service: Vascular;  Laterality: Right;   FOOT SURGERY Left    FRACTURE SURGERY Left    Hx: of Left heel   INGUINAL HERNIA REPAIR Bilateral    X 2    INTRAOPERATIVE TRANSESOPHAGEAL ECHOCARDIOGRAM N/A 10/18/2012   Procedure: INTRAOPERATIVE TRANSESOPHAGEAL ECHOCARDIOGRAM;  Surgeon: Grace Isaac, MD;  Location: [place];  Service: Open Heart Surgery;  Laterality: N/A;   LEFT HEART CATH AND CORS/GRAFTS ANGIOGRAPHY N/A 10/25/2019   Procedure: LEFT HEART CATH AND CORS/GRAFTS ANGIOGRAPHY;  Surgeon: Leonie Man, MD;  Location: [place] [facility];  Service: Cardiovascular;  Laterality: N/A;   LEFT HEART CATHETERIZATION WITH CORONARY/GRAFT ANGIOGRAM N/A 02/02/2013   Procedure: LEFT HEART CATHETERIZATION WITH Beatrix Fetters;  Surgeon: Jacolyn Reedy, MD;  Location: [facility] CATH LAB;  Service: Cardiovascular;  Laterality: N/A;   LOWER EXTREMITY ANGIOGRAM Right  06/27/2015   Procedure: Lower Extremity Angiogram;  Surgeon: Serafina Mitchell, MD;  Location: [place] [facility];  Service: Cardiovascular;  Laterality: Right;   MASTOIDECTOMY  1933   PACEMAKER IMPLANT N/A 05/21/2020   Procedure: PACEMAKER IMPLANT;  Surgeon: Vickie Epley, MD;  Location: [place] [facility];  Service: Cardiovascular;  Laterality: N/A;   PERIPHERAL VASCULAR CATHETERIZATION N/A 01/23/2015   Procedure: Abdominal Aortogram;  Surgeon: Serafina Mitchell, MD;  Location: [place] [facility];  Service: Cardiovascular;  Laterality: N/A;   PERIPHERAL VASCULAR CATHETERIZATION N/A 06/27/2015   Procedure: Abdominal Aortogram;  Surgeon: Serafina Mitchell, MD;  Location: [place] [facility];  Service: Cardiovascular;  Laterality: N/A;   RETINAL DETACHMENT SURGERY Left    Hx: of left eye   TEE WITHOUT CARDIOVERSION N/A 02/22/2020  Procedure: TRANSESOPHAGEAL ECHOCARDIOGRAM (TEE);  Surgeon: [name], MD;  Location: [facility] ENDOSCOPY;  Service: Cardiovascular;  Laterality: N/A;   TONSILLECTOMY      Current Medications: Current Meds  Medication Sig   acetaminophen (TYLENOL) 500 MG tablet Take 1,000 mg by mouth every 6 (six) hours as needed for moderate pain.   allopurinol (ZYLOPRIM) 100 MG tablet Take 100 mg by mouth daily.    amiodarone (PACERONE) 200 MG tablet Take 0.5 tablets (100 mg total) by mouth daily.   clopidogrel (PLAVIX) 75 MG tablet Take 1 tablet (75 mg total) by mouth daily.   escitalopram (LEXAPRO) 10 MG tablet Take 10 mg by mouth daily.   furosemide (LASIX) 40 MG tablet Take 40 mg by mouth daily.   lansoprazole (PREVACID) 30 MG capsule Take 30 mg by mouth daily.   levothyroxine (SYNTHROID) 125 MCG tablet Take 125 mcg by mouth daily.   MELATONIN PO Take 1 tablet by mouth at bedtime as needed (sleep). Unknown strength   Multiple Vitamin (MULTIVITAMIN WITH MINERALS) TABS tablet Take 1 tablet by mouth daily. Unknown strength   nitroGLYCERIN (NITROSTAT) 0.4 MG SL tablet Place 0.4 mg  under the tongue every 5 (five) minutes as needed for chest pain.   oxyCODONE (ROXICODONE) 5 MG immediate release tablet Take 1 tablet (5 mg total) by mouth every 4 (four) hours as needed for severe pain.   rosuvastatin (CRESTOR) 40 MG tablet Take 40 mg by mouth every other day.   sacubitril-valsartan (ENTRESTO) 24-26 MG Take 1 tablet by mouth 2 (two) times daily.   [DISCONTINUED] tamsulosin (FLOMAX) 0.4 MG CAPS capsule Take 0.4 mg by mouth at bedtime.     Allergies:   Atorvastatin, Xarelto [rivaroxaban], Amoxicillin, and Metoprolol   Social History   Socioeconomic History   Marital status: Married    Spouse name: Not on file   Number of children: 3   Years of education: Not on file   Highest education level: Not on file  Occupational History   Occupation: retired  Tobacco Use   Smoking status: Former    Types: Cigarettes    Quit date: 04/29/1975    Years since quitting: 46.1   Smokeless tobacco: Never  Vaping Use   Vaping Use: Never used  Substance and Sexual Activity   Alcohol use: Yes    Alcohol/week: 3.0 standard drinks    Types: 3 Glasses of wine per week    Comment: 3 glasses of wine today   Drug use: No   Sexual activity: Not Currently  Other Topics Concern   Not on file  Social History Narrative   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 There is no history of Colon cancer, Stomach cancer, Rectal cancer, Esophageal cancer, Liver disease, or Kidney disease. ROS:   Please see the history of present illness.    All 14 point  review of systems negative except as described per history of present illness  EKGs/Labs/Other Studies Reviewed:      Recent Labs: 03/05/2021: ALT 45; TSH 10.000 05/13/2021: NT-Pro BNP 3,457  05/16/2021: BUN 33; Creatinine, Ser 1.29; Hemoglobin 12.2; Platelets 161; Potassium 3.7; Sodium 137  Recent Lipid Panel    Component Value Date/Time   CHOL 121 01/23/2020 1040   TRIG 191 (H) 01/23/2020 1040   HDL 30 (L) 01/23/2020 1040   CHOLHDL 4.0 01/23/2020 1040   CHOLHDL 3.2 04/18/2009 0241   VLDL 23 04/18/2009 0241   LDLCALC 59 01/23/2020 1040    Physical Exam:    VS:  Ht 5\' 10"  (1.778 m)    Wt 146 lb (66.2 kg)    SpO2 (!) 80%    BMI 20.95 kg/m     Wt Readings from Last 3 Encounters:  05/31/21 146 lb (66.2 kg)  05/27/21 148 lb 4.8 oz (67.3 kg)  05/16/21 152 lb 1.9 oz (69 kg)     GEN:  Well nourished, well developed in no acute distress HEENT: Normal NECK: No JVD; No carotid bruits LYMPHATICS: No lymphadenopathy CARDIAC: RRR, holosystolic murmur grade 2/6 best heard apex, no rubs, no gallops RESPIRATORY:  Clear to auscultation without rales, wheezing or rhonchi  ABDOMEN: Soft, non-tender, non-distended MUSCULOSKELETAL:  No edema; No deformity  SKIN: Warm and dry LOWER EXTREMITIES: no swelling NEUROLOGIC:  Alert and oriented x 3 PSYCHIATRIC:  Normal affect   ASSESSMENT:    1. Coronary artery disease involving native coronary artery of native heart with unstable angina pectoris ([place])   2. Chronic systolic heart failure ([place])   3. Mitral valve disorder   4. Peripheral vascular disease ([place])    PLAN:    In order of problems listed above:  Cardiomyopathy severely reduced left ventricle ejection fraction.  We having difficulty put him on appropriate medication because of low blood pressure.  I asked him to discontinue Flomax I will also give him 2 days off diuretics.  After that he will restart 40 mg of Lasix daily.  I see him back in my office in about 2 to 3 weeks. Mitral  valve disorder.  Stable continue present management. Chronic systolic congestive heart failure appears to be compensated on the physical exam today of course concern is low blood pressure plan as described above   Medication Adjustments/Labs and Tests Ordered: Current medicines are reviewed at length with the patient today.  Concerns regarding medicines are outlined above.  Orders Placed This Encounter  Procedures   Basic Metabolic Panel (BMET)   EKG 12-Lead   Medication changes: No orders of the defined types were placed in this encounter.   Signed, Park Liter, MD, [facility] 05/31/2021 11:57 AM    [place]

## 2021-06-01 ENCOUNTER — Encounter: Payer: Self-pay | Admitting: Family Medicine

## 2021-06-01 DIAGNOSIS — Z515 Encounter for palliative care: Secondary | ICD-10-CM | POA: Insufficient documentation

## 2021-06-01 LAB — BASIC METABOLIC PANEL
BUN/Creatinine Ratio: 15 (ref 10–24)
BUN: 24 mg/dL (ref 10–36)
CO2: 24 mmol/L (ref 20–29)
Calcium: 9.3 mg/dL (ref 8.6–10.2)
Chloride: 101 mmol/L (ref 96–106)
Creatinine, Ser: 1.55 mg/dL — ABNORMAL HIGH (ref 0.76–1.27)
Glucose: 124 mg/dL — ABNORMAL HIGH (ref 70–99)
Potassium: 4 mmol/L (ref 3.5–5.2)
Sodium: 141 mmol/L (ref 134–144)
eGFR: 42 mL/min/{1.73_m2} — ABNORMAL LOW (ref >59)

## 2021-06-11 NOTE — Progress Notes (Signed)
Called patient and informed him of the results. Asked patient how he was feeling and he stated that he was feeling fine since he had stopped taking the Lasix for a couple of days. Patient also stated that he had started taking his lasix again because he had noticed some swelling. Dr. Agustin Cree informed.

## 2021-06-13 ENCOUNTER — Telehealth: Payer: Self-pay | Admitting: Cardiology

## 2021-06-13 NOTE — Telephone Encounter (Signed)
Recommendation reviewed with pt as per Dr. Julien Nordmann note.  Pt verbalized understanding and had no additional questions. Routed to PCP.

## 2021-06-13 NOTE — Telephone Encounter (Signed)
Ian Cummings from [facility] states that the pts blood pressure has been really low... sitting 112/70 and standing 84/56. Will Dr. Agustin Cree consider reducing dosage of Entresto as the pt is reporting lightheadedness and dizziness w/ upright activity... please advise

## 2021-06-18 ENCOUNTER — Encounter: Payer: Self-pay | Admitting: Cardiology

## 2021-06-18 ENCOUNTER — Ambulatory Visit: Payer: Medicare HMO | Admitting: Cardiology

## 2021-06-18 ENCOUNTER — Other Ambulatory Visit: Payer: Self-pay

## 2021-06-18 VITALS — BP 142/78 | HR 71 | Ht 70.0 in | Wt 146.0 lb

## 2021-06-18 DIAGNOSIS — I10 Essential (primary) hypertension: Secondary | ICD-10-CM

## 2021-06-18 DIAGNOSIS — I25110 Atherosclerotic heart disease of native coronary artery with unstable angina pectoris: Secondary | ICD-10-CM | POA: Diagnosis not present

## 2021-06-18 DIAGNOSIS — I4819 Other persistent atrial fibrillation: Secondary | ICD-10-CM

## 2021-06-18 DIAGNOSIS — I255 Ischemic cardiomyopathy: Secondary | ICD-10-CM

## 2021-06-18 DIAGNOSIS — I714 Abdominal aortic aneurysm, without rupture, unspecified: Secondary | ICD-10-CM | POA: Diagnosis not present

## 2021-06-18 NOTE — Patient Instructions (Signed)
Medication Instructions:  Your physician recommends that you continue on your current medications as directed. Please refer to the Current Medication list given to you today.  *If you need a refill on your cardiac medications before your next appointment, please call your pharmacy*   Lab Work: Your physician recommends that you return for lab work in:   Labs today: BMP, Pro BNP  If you have labs (blood work) drawn today and your tests are completely normal, you will receive your results only by: MyChart Message (if you have MyChart) OR A paper copy in the mail If you have any lab test that is abnormal or we need to change your treatment, we will call you to review the results.   Testing/Procedures: None   Follow-Up: At [facility], you and your health needs are our priority.  As part of our continuing mission to provide you with exceptional heart care, we have created designated Provider Care Teams.  These Care Teams include your primary Cardiologist (physician) and Advanced Practice Providers (APPs -  Physician Assistants and Nurse Practitioners) who all work together to provide you with the care you need, when you need it.  We recommend signing up for the patient portal called "MyChart".  Sign up information is provided on this After Visit Summary.  MyChart is used to connect with patients for Virtual Visits (Telemedicine).  Patients are able to view lab/test results, encounter notes, upcoming appointments, etc.  Non-urgent messages can be sent to your provider as well.   To learn more about what you can do with MyChart, go to [email or web address].    Your next appointment:   6 week(s)  The format for your next appointment:   In Person  Provider:   Jenne Campus, MD    Other Instructions None

## 2021-06-18 NOTE — Progress Notes (Signed)
Cardiology Office Note:    Date:  06/18/2021   ID:  Ian Cummings, DOB 1928-10-28, MRN [id number]  PCP:  Ginger Organ., MD  Cardiologist:  Jenne Campus, MD    Referring MD: Ginger Organ., MD   No chief complaint on file. Feeling dramatically better  History of Present Illness:    Ian Cummings is a 88 y.o. male    with past medical history significant for coronary artery disease.  He does have remote coronary artery bypass graft he did have acute coronary syndrome in July 2021 with both grafts occluded and severe native CAD not amendable for any future revascularization, paroxysmal atrial fibrillation maintaining sinus rhythm on amiodarone, symptomatic bradycardia with sinus pauses required permanent pacemaker, he is chronically anticoagulated, hypertensive heart disease, cardiomyopathy with ejection fraction 25 to 30%, chronic kidney failure.  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 Last time I seen him but I gave him a break from diuretic for 2 days we also discontinued Flomax that make him feel somewhat better however in spite of that he felt poorly he did have orthostatic hypotension, eventually his [name] has been recalled and he is feeling dramatically better he started walking on the regular basis  Past Medical History:  Diagnosis Date   AAA (abdominal aortic aneurysm)    2004, Pt stated not there now   Anginal pain ([place])    Anxiety    Arthritis    Atherosclerosis of native arteries of the extremities with intermittent claudication 12/04/2011   ABI .22 and .54 in 2012    Atherosclerotic PVD with intermittent claudication ([place]) 02/20/2014   BPH (benign prostatic hypertrophy)    Bradycardia 01/12/2019   Bradycardia on ECG 11/11/2017   CAD (coronary artery disease)    Carotid artery disease ([place])    Right carotid stent 2013   Carotid artery disease without cerebral infarction [facility])    S/p right carotid stent for asymptomatic disease   2013    Chronic diastolic (congestive) heart failure ([place]) 09/10/2019   Chronic kidney disease, stage 3a ([place]) 09/10/2019   Claudication ([place])    Colon polyps    adenomatous   Complex sleep apnea syndrome 11/11/2017   Conjunctival lesion 05/04/2014   Formatting of this note might be different from the original. left   Coronary artery disease    Coronary artery disease involving native coronary artery of native heart with unstable angina pectoris ([place]) 09/06/2012   CABG w LIMA to LAD, SVG to OM1-OM2, SVG to dx, SVG to PD/PL 1995,  Cypher stent x 2 SVG to RCA and Cypher stent to Circ Graft 05/13/07, stent of SVG to circ OM in [place] in July 2013  Cath 4/14 [facility] normal Left main, occluded mid LAD, occluded RCA SVG, occluded CFX, widely patent OM 1 SVG, occluded RCA SVG, occluded Diag 1 SVG, widely patent LAD LIMA graft;  Redo CABG Dr. Servando Snare  10/18/12 with    Depression    Essential hypertension 01/12/2019   GERD (gastroesophageal reflux disease)    Gout    H/O hiatal hernia    Hyperlipidemia    Hypertension    Hypertension, renal disease, stage 1-4 or unspecified chronic kidney disease    Hypothyroidism    Malnutrition of moderate degree 08/05/2015   MI (myocardial infarction) ([place]) 4/14   Mixed hyperlipidemia    Neuropathy    Hx: of in B/L toes   OSA on CPAP 02/17/2018   PAD (peripheral  artery disease) ([place]) 08/03/2015   PAF (paroxysmal atrial fibrillation) ([place])    Peripheral vascular disease ([place])    with claudication   Pre-diabetes    RBBB 01/12/2019   s/p CABG    Shoulder pain 01/27/2019   Stroke ([place])    TIA (transient ischemic attack)    Hx: of   Unstable angina ([place]) 09/10/2019    Past Surgical History:  Procedure Laterality Date   CARDIOVERSION N/A 02/22/2020   Procedure: CARDIOVERSION;  Surgeon: [name], MD;  Location: [place];  Service: Cardiovascular;  Laterality: N/A;   CAROTID STENT INSERTION Right 2013   right at [place] EXTRACTION  Bilateral    Hx: of both eyes   COLONOSCOPY     Hx: of   COLONOSCOPY     CORONARY ANGIOPLASTY WITH STENT PLACEMENT  2010   CORONARY ARTERY BYPASS GRAFT  1995   CORONARY ARTERY BYPASS GRAFT N/A 10/18/2012   Procedure: REDO CORONARY ARTERY BYPASS GRAFTING (CABG);  Surgeon: Grace Isaac, MD;  Location: [place];  Service: Open Heart Surgery;  Laterality: N/A;  off pump times two using endoscopically harvested left saphenous vein   CORONARY STENT PLACEMENT  06/2016   ENDARTERECTOMY FEMORAL Right 08/03/2015   Procedure: RIGHT FEMORAL ENDARTERECTOMY WITH PATCH ANGIOPLASTY;  Surgeon: Serafina Mitchell, MD;  Location: [place];  Service: Vascular;  Laterality: Right;   FEMORAL-POPLITEAL BYPASS GRAFT Right 08/03/2015   Procedure: RIGHT FEMORAL-BELOW KNEE POPLITEAL ARTERY BYPASS GRAFT;  Surgeon: Serafina Mitchell, MD;  Location: [place];  Service: Vascular;  Laterality: Right;   FOOT SURGERY Left    FRACTURE SURGERY Left    Hx: of Left heel   INGUINAL HERNIA REPAIR Bilateral    X 2    INTRAOPERATIVE TRANSESOPHAGEAL ECHOCARDIOGRAM N/A 10/18/2012   Procedure: INTRAOPERATIVE TRANSESOPHAGEAL ECHOCARDIOGRAM;  Surgeon: Grace Isaac, MD;  Location: [place];  Service: Open Heart Surgery;  Laterality: N/A;   LEFT HEART CATH AND CORS/GRAFTS ANGIOGRAPHY N/A 10/25/2019   Procedure: LEFT HEART CATH AND CORS/GRAFTS ANGIOGRAPHY;  Surgeon: Leonie Man, MD;  Location: [place] CV LAB;  Service: Cardiovascular;  Laterality: N/A;   LEFT HEART CATHETERIZATION WITH CORONARY/GRAFT ANGIOGRAM N/A 02/02/2013   Procedure: LEFT HEART CATHETERIZATION WITH Beatrix Fetters;  Surgeon: Jacolyn Reedy, MD;  Location[facility] C[facility] LAB;  Service: Cardiovascular;  Laterality: N/A;   LOWER EXTREMITY ANGIOGRAM Right 06/27/2015   Procedure: Lower Extremity Angiogram;  Surgeon: Serafina Mitchell, MD;  Location: [place] CV LAB;  Service: Cardiovascular;  Laterality: Right;   MASTOIDECTOMY  1933   PACEMAKER IMPLANT N/A 05/21/2020    Procedure: PACEMAKER IMPLANT;  Surgeon: Vickie Epley, MD;  Location: [place] [facility];  Service: Cardiovascular;  Laterality: N/A;   PERIPHERAL VASCULAR CATHETERIZATION N/A 01/23/2015   Procedure: Abdominal Aortogram;  Surgeon: Serafina Mitchell, MD;  Location: [place] CV [facility];  Service: Cardiovascular;  Laterality: N/A;   PERIPHERAL VASCULAR CATHETERIZATION N/A 06/27/2015   Procedure: Abdominal Aortogram;  Surgeon: Serafina Mitchell, MD;  Location: [place] CV LAB;  Service: Cardiovascular;  Laterality: N/A;   RETINAL DETACHMENT SURGERY Left    Hx: of left eye   TEE WITHOUT CARDIOVERSION N/A 02/22/2020   Procedure: TRANSESOPHAGEAL ECHOCARDIOGRAM (TEE);  Surgeon: [name], MD;  Location: [facility] ENDOSCOPY;  Service: Cardiovascular;  Laterality: N/A;   TONSILLECTOMY      Current Medications: Current Meds  Medication Sig   acetaminophen (TYLENOL) 500 MG tablet Take 1,000 mg by mouth every  6 (six) hours as needed for moderate pain.   allopurinol (ZYLOPRIM) 100 MG tablet Take 100 mg by mouth daily.    amiodarone (PACERONE) 200 MG tablet Take 0.5 tablets (100 mg total) by mouth daily.   clopidogrel (PLAVIX) 75 MG tablet Take 1 tablet (75 mg total) by mouth daily.   escitalopram (LEXAPRO) 10 MG tablet Take 10 mg by mouth daily.   furosemide (LASIX) 40 MG tablet Take 40 mg by mouth daily.   lansoprazole (PREVACID) 30 MG capsule Take 30 mg by mouth daily.   levothyroxine (SYNTHROID) 125 MCG tablet Take 125 mcg by mouth daily.   MELATONIN PO Take 1 tablet by mouth at bedtime as needed (sleep). Unknown strength   Multiple Vitamin (MULTIVITAMIN WITH MINERALS) TABS tablet Take 1 tablet by mouth daily. Unknown strength   nitroGLYCERIN (NITROSTAT) 0.4 MG SL tablet Place 0.4 mg under the tongue every 5 (five) minutes as needed for chest pain.   oxyCODONE (ROXICODONE) 5 MG immediate release tablet Take 1 tablet (5 mg total) by mouth every 4 (four) hours as needed for severe pain.   rosuvastatin  (CRESTOR) 40 MG tablet Take 40 mg by mouth every other day.     Allergies:   Atorvastatin, Xarelto [rivaroxaban], Amoxicillin, and Metoprolol   Social History   Socioeconomic History   Marital status: Married    Spouse name: Not on file   Number of children: 3   Years of education: Not on file   Highest education level: Not on file  Occupational History   Occupation: retired  Tobacco Use   Smoking status: Former    Types: Cigarettes    Quit date: 04/29/1975    Years since quitting: 46.1   Smokeless tobacco: Never  Vaping Use   Vaping Use: Never used  Substance and Sexual Activity   Alcohol use: Yes    Alcohol/week: 3.0 standard drinks    Types: 3 Glasses of wine per week    Comment: 3 glasses of wine today   Drug use: No   Sexual activity: Not Currently  Other Topics Concern   Not on file  Social History Narrative   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 There is no history of Colon cancer, Stomach cancer, Rectal cancer, Esophageal cancer, Liver disease, or Kidney disease. ROS:   Please see the history of present illness.    All 14 point review of systems negative except as described per history of present illness  EKGs/Labs/Other Studies Reviewed:      Recent Labs: 03/05/2021: ALT 45; TSH 10.000 05/13/2021: NT-Pro BNP 3,457 05/16/2021: Hemoglobin 12.2; Platelets 161 05/31/2021: BUN 24; Creatinine, Ser 1.55; Potassium 4.0; Sodium 141  Recent Lipid Panel    Component Value Date/Time   CHOL 121 01/23/2020 1040   TRIG 191 (H)  01/23/2020 1040   HDL 30 (L) 01/23/2020 1040   CHOLHDL 4.0 01/23/2020 1040   CHOLHDL 3.2 04/18/2009 0241   VLDL 23 04/18/2009 0241   LDLCALC 59 01/23/2020 1040    Physical Exam:    VS:  BP (!) 142/78 (BP Location: Right Arm)    Pulse 71    Ht 5\' 10"  (1.778 m)    Wt 146 lb (66.2 kg)    SpO2 97%    BMI 20.95 kg/m     Wt Readings from Last 3 Encounters:  06/18/21 146 lb (66.2 kg)  05/31/21 146 lb (66.2 kg)  05/27/21 148 lb 4.8 oz (67.3 kg)     GEN:  Well nourished, well developed in no acute distress HEENT: Normal NECK: No JVD; No carotid bruits LYMPHATICS: No lymphadenopathy CARDIAC: RRR, no murmurs, no rubs, no gallops RESPIRATORY:  Clear to auscultation without rales, wheezing or rhonchi  ABDOMEN: Soft, non-tender, non-distended MUSCULOSKELETAL:  No edema; No deformity  SKIN: Warm and dry LOWER EXTREMITIES: no swelling NEUROLOGIC:  Alert and oriented x 3 PSYCHIATRIC:  Normal affect   ASSESSMENT:    1. Essential hypertension   2. Abdominal aortic aneurysm (AAA) without rupture, unspecified part   3. Persistent atrial fibrillation ([place])   4. Coronary artery disease involving native coronary artery of native heart with unstable angina pectoris ([place])   5. Ischemic cardiomyopathy    PLAN:    In order of problems listed above:  Cardiomyopathy severely diminished left ventricle ejection fraction 70 he cannot tolerate much medication.  He is only on diuretic unable to tolerate beta-blocker, unable to tolerate Entresto/ACE inhibitor.  We will continue supportive care for now.  I will check his Chem-7 today. Persistent atrial fibrillation.  Actually his EKG today showing atrial pacing.  Continue monitoring. Coronary artery disease stable no new issues for at least for now. Overall did very sad patient.  He is unable to tolerate medications for his cardiomyopathy.  We will continue supportive care   Medication Adjustments/Labs and Tests Ordered: Current medicines are reviewed  at length with the patient today.  Concerns regarding medicines are outlined above.  No orders of the defined types were placed in this encounter.  Medication changes: No orders of the defined types were placed in this encounter.   Signed, Park Liter, MD, [facility] 06/18/2021 2:31 PM    [place]

## 2021-06-19 LAB — BASIC METABOLIC PANEL
BUN/Creatinine Ratio: 22 (ref 10–24)
BUN: 27 mg/dL (ref 10–36)
CO2: 25 mmol/L (ref 20–29)
Calcium: 9.1 mg/dL (ref 8.6–10.2)
Chloride: 101 mmol/L (ref 96–106)
Creatinine, Ser: 1.25 mg/dL (ref 0.76–1.27)
Glucose: 77 mg/dL (ref 70–99)
Potassium: 4.2 mmol/L (ref 3.5–5.2)
Sodium: 143 mmol/L (ref 134–144)
eGFR: 55 mL/min/{1.73_m2} — ABNORMAL LOW (ref >59)

## 2021-06-19 LAB — PRO B NATRIURETIC PEPTIDE: NT-Pro BNP: 3849 pg/mL — ABNORMAL HIGH (ref 0–486)

## 2021-06-24 ENCOUNTER — Telehealth: Payer: Self-pay

## 2021-06-24 NOTE — Telephone Encounter (Signed)
Patient notified of results.

## 2021-06-24 NOTE — Telephone Encounter (Signed)
-----   Message from Park Liter, MD sent at 06/24/2021  4:45 PM EST ----- Watch very carefully his weight ----- Message ----- From: Darrel Reach, CMA Sent: 06/21/2021   3:44 PM EST To: Park Liter, MD  Could you explain what does ( watch very carefully he is awake.) mean in the result message. Thanks  ----- Message ----- From: Park Liter, MD Sent: 06/19/2021   2:48 PM EST To: Louie Casa, RN  Labs acceptable, kidney function normal.  Please asked him to watch very carefully he is awake.  Otherwise everything looks fine

## 2021-07-05 ENCOUNTER — Other Ambulatory Visit: Payer: Medicare HMO | Admitting: Family Medicine

## 2021-07-05 ENCOUNTER — Other Ambulatory Visit: Payer: Self-pay

## 2021-07-05 ENCOUNTER — Encounter: Payer: Self-pay | Admitting: Family Medicine

## 2021-07-05 VITALS — BP 148/86 | HR 89 | Temp 97.4°F | Resp 18

## 2021-07-05 DIAGNOSIS — I5032 Chronic diastolic (congestive) heart failure: Secondary | ICD-10-CM

## 2021-07-05 NOTE — Progress Notes (Signed)
? ? ?Manufacturing engineer ?Community Palliative Care Consult Note ?Telephone: [phone]  ?Fax: [phone]  ? ? ?Date of encounter: 07/05/21 ?12:55 PM ?PATIENT NAME: Ian Cummings ?[place] [address]   ?[phone] (home)  ?DOB: 03/04/1929 ?MRN: [id number] ?PRIMARY CARE PROVIDER:    ?Ginger Organ., MD,  ?[address] ?[place] [address] ?[phone] ? ?REFERRING PROVIDER:   ?Ginger Organ., MD ?[address] ?[place],  [place] [address] ?[phone] ? ?RESPONSIBLE PARTY:    ?Contact Information   ? ? Name Relation Home Work Mobile  ? Adlai, Sinning Spouse   [phone]  ? Cillian, Gwinner   [phone]  ? ?  ? ? ? ?I met face to face with patient in his home. Palliative Care was asked to follow this patient by consultation request of  Ginger Organ., MD to address advance care planning and complex medical decision making. This is a follow up visit. ? ?                                 ASSESSMENT, SYMPTOM MANAGEMENT AND PLAN / RECOMMENDATIONS: ? ? Chronic diastolic heart failure ?Check daily weights and call for weight gain > 2-3 lbs/24 hours or 5 lbs in 5 days. ?Continue Furosemide 40 mg daily, may need temporary increase if weight gain continues. ? ? ? ? ?Advance Care Planning/Goals of Care:  ?CODE STATUS: ?DNR ? ? ? ?Follow up Palliative Care Visit: Palliative care will continue to follow for complex medical decision making, advance care planning, and clarification of goals. Return 4 weeks or prn. ? ? ? ?This visit was coded based on medical decision making (MDM). ? ?PPS: 60% ? ?HOSPICE ELIGIBILITY/DIAGNOSIS: TBD ? ?Chief Complaint:  ?Palliative Care is following for chronic management of metastatic renal cancer and chronic diastolic heart failure. ? ?HISTORY OF PRESENT ILLNESS:  Ian Cummings is a 89 y.o. year old male  with metastatic renal cancer and chronic diastolic heart failure, CAD, HTN, PAD/PVD, AAA, OSA, hypothyroidism and impaired fasting  glucose. He states that he is feeling better in the last 2 weeks. He states his BP is down and he is feeling wobbly. Fasting blood sugar 96. His BP this am 131/77 on automated machine then went down to 90s SBP. He expressed concern that he had gained 4 lbs overnight.  No orthopnea, SOB, PND, CP. No nausea or vomiting. No trouble with constipation.  Pt states he has gone back to cooking since he is feeling better right now. ?History obtained from review of EMR, discussion with primary team, and interview with family, facility staff/caregiver and/or Ian Cummings.  ?I reviewed available labs, medications, imaging, studies and related documents from the EMR.  Records reviewed and summarized above.  ? ?ROS ? ?General: NAD ?EYES: denies vision changes ?ENMT: denies dysphagia ?Cardiovascular: denies chest pain, denies DOE ?Pulmonary: denies cough, denies increased SOB ?Abdomen: endorses good appetite, denies constipation, endorses continence of bowel ?GU: denies dysuria, endorses continence of urine ?MSK:  denies increased weakness,  no falls reported ?Skin: denies rashes or wounds ?Neurological: denies pain, denies insomnia ?Psych: Endorses positive mood ?Heme/lymph/immuno: denies bruises, abnormal bleeding ? ?Physical Exam: ?Current and past weights: 145.9 from 141.5 overnight ?Constitutional: NAD ?General: thin  ?EYES: anicteric sclera, lids intact, no discharge  ?ENMT: intact hearing, oral mucous membranes moist, dentition intact ?CV: S1S2, RRR, no LE edema ?Pulmonary: CTAB, no increased work of  breathing, no cough, room air ?Abdomen: normo-active BS + 4 quadrants, soft and non tender, no ascites ?GU: deferred ?MSK: no sarcopenia, moves all extremities, ambulatory ?Skin: warm and dry, no rashes or wounds on visible skin ?Neuro:  no generalized weakness,  no cognitive impairment ?Psych: non-anxious affect, A and O x 3 ?Hem/lymph/immuno: no widespread bruising ? ? ?Thank you for the opportunity to participate in the care of  Ian Cummings.  The palliative care team will continue to follow. Please call our office at [phone] if we can be of additional assistance.  ? ?Marijo Conception, FNP -C ? ?COVID-19 PATIENT SCREENING TOOL ?Asked and negative response unless otherwise noted:  ? ?Have you had symptoms of covid, tested positive or been in contact with someone with symptoms/positive test in the past 5-10 days?  no ? ?

## 2021-07-09 ENCOUNTER — Ambulatory Visit: Payer: Medicare HMO | Admitting: Cardiology

## 2021-07-29 ENCOUNTER — Ambulatory Visit (HOSPITAL_COMMUNITY)
Admission: RE | Admit: 2021-07-29 | Discharge: 2021-07-29 | Disposition: A | Discharge status: Home / Self Care | Payer: Medicare HMO | Source: Ambulatory Visit | Attending: Surgery | Admitting: Surgery

## 2021-07-29 ENCOUNTER — Ambulatory Visit: Payer: Medicare HMO | Admitting: Physician Assistant

## 2021-07-29 VITALS — BP 129/77 | HR 81 | Temp 98.6°F | Resp 20 | Ht 70.0 in | Wt 151.1 lb

## 2021-07-29 DIAGNOSIS — I6523 Occlusion and stenosis of bilateral carotid arteries: Secondary | ICD-10-CM | POA: Diagnosis not present

## 2021-07-29 NOTE — Progress Notes (Signed)
? ? ? ?History of Present Illness:  Patient is a 88 y.o. year old male who presents for evaluation of carotid stenosis. He has a history of UCA stent placement at  [facility] in 2013 by Dr. Harvel Ricks.   ?The patient denies symptoms of TIA, amaurosis, or stroke.   ? He has had multiple vascular interventions to include:  right EIA, CFA and profunda femoral artery endarterectomy with bovine pericardial patch angioplasty, followed by a right common femoral to below knee popliteal artery bypass graft with 6 mm Gore-Tex on 08-03-15 by Dr. Trula Slade for a right heel ulcer and a right great toe ulcer.  He has a history of CABG in 1996 x 4-5 vessels, off pump CABG x 26 September 2012 by Dr. Servando Snare.  ?He subsequently had a stent placed (PTCA with stent placement )at the end of March 2018 in [place], [facility] while he was on vacation there.   ?Patient underwent MRI of the abdomen with and without contrast on May 16, 2020 secondary to right renal mass.  ? ?He and his wife have been married since 1968.  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  ?  ?The pt is on a statin for cholesterol management.    ?The pt is not on an aspirin.    Other AC:  Plavix/Xarelto (PAF) ?The pt is on ARB for hypertension.  ?The pt does not have diabetes. ?Tobacco hx:  former ? ?Past Medical History:  ?Diagnosis Date  ? AAA (abdominal aortic aneurysm) ([place])   ? 2004, Pt stated not there now  ? Anginal pain ([place])   ? Anxiety   ? Arthritis   ? Atherosclerosis of native arteries of the extremities with intermittent claudication 12/04/2011  ? ABI .81 and .54 in 2012   ? Atherosclerotic PVD with intermittent claudication ([place]) 02/20/2014  ? BPH (benign prostatic hypertrophy)   ? Bradycardia 01/12/2019  ? Bradycardia on ECG 11/11/2017  ? CAD (coronary artery disease)   ? Carotid artery disease ([place])   ? Right carotid stent 2013  ? Carotid artery disease without cerebral infarction [facility])   ? S/p right carotid stent for asymptomatic disease  2013   ? Chronic  diastolic (congestive) heart failure ([place]) 09/10/2019  ? Chronic kidney disease, stage 3a ([place]) 09/10/2019  ? Claudication [facility])   ? Colon polyps   ? adenomatous  ? Complex sleep apnea syndrome 11/11/2017  ? Conjunctival lesion 05/04/2014  ? Formatting of this note might be different from the original. left  ? Coronary artery disease   ? Coronary artery disease involving native coronary artery of native heart with unstable angina pectoris ([place]) 09/06/2012  ? CABG w LIMA to LAD, SVG to OM1-OM2, SVG to dx, SVG to PD/PL 1995,  Cypher stent x 2 SVG to RCA and Cypher stent to Circ Graft 05/13/07, stent of SVG to circ OM in [place] in July 2013  Cath 4/14 [facility] normal Left main, occluded mid LAD, occluded RCA SVG, occluded CFX, widely patent OM 1 SVG, occluded RCA SVG, occluded Diag 1 SVG, widely patent LAD LIMA graft;  Redo CABG Dr. Servando Snare  10/18/12 with   ? Depression   ? Essential hypertension 01/12/2019  ? GERD (gastroesophageal reflux disease)   ? Gout   ? H/O hiatal hernia   ? Hyperlipidemia   ? Hypertension   ? Hypertension, renal disease, stage 1-4 or unspecified chronic kidney disease   ? Hypothyroidism   ? Malnutrition of moderate degree 08/05/2015  ? MI (myocardial infarction) (  [place]) 4/14  ? Mixed hyperlipidemia   ? Neuropathy   ? Hx: of in B/L toes  ? OSA on CPAP 02/17/2018  ? PAD (peripheral artery disease) ([place]) 08/03/2015  ? PAF (paroxysmal atrial fibrillation) ([place])   ? Peripheral vascular disease ([place])   ? with claudication  ? Pre-diabetes   ? RBBB 01/12/2019  ? s/p CABG   ? Shoulder pain 01/27/2019  ? Stroke [facility])   ? TIA (transient ischemic attack)   ? Hx: of  ? Unstable angina ([place]) 09/10/2019  ? ? ?Past Surgical History:  ?Procedure Laterality Date  ? CARDIOVERSION N/A 02/22/2020  ? Procedure: CARDIOVERSION;  Surgeon: [name], MD;  Location: [place];  Service: Cardiovascular;  Laterality: N/A;  ? CAROTID STENT INSERTION Right 2013  ? right at [place]  ? CATARACT EXTRACTION Bilateral   ? Hx:  of both eyes  ? COLONOSCOPY    ? Hx: of  ? COLONOSCOPY    ? CORONARY ANGIOPLASTY WITH STENT PLACEMENT  2010  ? CORONARY ARTERY BYPASS GRAFT  1995  ? CORONARY ARTERY BYPASS GRAFT N/A 10/18/2012  ? Procedure: REDO CORONARY ARTERY BYPASS GRAFTING (CABG);  Surgeon: Grace Isaac, MD;  Location: [place];  Service: Open Heart Surgery;  Laterality: N/A;  off pump times two using endoscopically harvested left saphenous vein  ? CORONARY STENT PLACEMENT  06/2016  ? ENDARTERECTOMY FEMORAL Right 08/03/2015  ? Procedure: RIGHT FEMORAL ENDARTERECTOMY WITH PATCH ANGIOPLASTY;  Surgeon: Serafina Mitchell, MD;  Location: [place];  Service: Vascular;  Laterality: Right;  ? FEMORAL-POPLITEAL BYPASS GRAFT Right 08/03/2015  ? Procedure: RIGHT FEMORAL-BELOW KNEE POPLITEAL ARTERY BYPASS GRAFT;  Surgeon: Serafina Mitchell, MD;  Location: [place];  Service: Vascular;  Laterality: Right;  ? FOOT SURGERY Left   ? FRACTURE SURGERY Left   ? Hx: of Left heel  ? INGUINAL HERNIA REPAIR Bilateral   ? X 2   ? INTRAOPERATIVE TRANSESOPHAGEAL ECHOCARDIOGRAM N/A 10/18/2012  ? Procedure: INTRAOPERATIVE TRANSESOPHAGEAL ECHOCARDIOGRAM;  Surgeon: Grace Isaac, MD;  Location: [place];  Service: Open Heart Surgery;  Laterality: N/A;  ? LEFT HEART CATH AND CORS/GRAFTS ANGIOGRAPHY N/A 10/25/2019  ? Procedure: LEFT HEART CATH AND CORS/GRAFTS ANGIOGRAPHY;  Surgeon: Leonie Man, MD;  Location: [place] CV LAB;  Service: Cardiovascular;  Laterality: N/A;  ? LEFT HEART CATHETERIZATION WITH CORONARY/GRAFT ANGIOGRAM N/A 02/02/2013  ? Procedure: LEFT HEART CATHETERIZATION WITH Beatrix Fetters;  Surgeon: Jacolyn Reedy, MD;  Location: [facility] CATH LAB;  Service: Cardiovascular;  Laterality: N/A;  ? LOWER EXTREMITY ANGIOGRAM Right 06/27/2015  ? Procedure: Lower Extremity Angiogram;  Surgeon: Serafina Mitchell, MD;  Location: [place] CV LAB;  Service: Cardiovascular;  Laterality: Right;  ? MASTOIDECTOMY  1933  ? PACEMAKER IMPLANT N/A 05/21/2020  ? Procedure: PACEMAKER  IMPLANT;  Surgeon: Vickie Epley, MD;  Location: [place] CV LAB;  Service: Cardiovascular;  Laterality: N/A;  ? PERIPHERAL VASCULAR CATHETERIZATION N/A 01/23/2015  ? Procedure: Abdominal Aortogram;  Surgeon: Serafina Mitchell, MD;  Location: [place] CV LAB;  Service: Cardiovascular;  Laterality: N/A;  ? PERIPHERAL VASCULAR CATHETERIZATION N/A 06/27/2015  ? Procedure: Abdominal Aortogram;  Surgeon: Serafina Mitchell, MD;  Location: [place] CV LAB;  Service: Cardiovascular;  Laterality: N/A;  ? RETINAL DETACHMENT SURGERY Left   ? Hx: of left eye  ? TEE WITHOUT CARDIOVERSION N/A 02/22/2020  ? Procedure: TRANSESOPHAGEAL ECHOCARDIOGRAM (TEE);  Surgeon: [name], MD;  Location: [place];  Service: Cardiovascular;  Laterality: N/A;  ?  TONSILLECTOMY    ? ? ? ?Social History ?Social History  ? ?Tobacco Use  ? Smoking status: Former  ?  Types: Cigarettes  ?  Quit date: 04/29/1975  ?  Years since quitting: 46.2  ?  Passive exposure: Never  ? Smokeless tobacco: Never  ?Vaping Use  ? Vaping Use: Never used  ?Substance Use Topics  ? Alcohol use: Yes  ?  Alcohol/week: 3.0 standard drinks  ?  Types: 3 Glasses of wine per week  ?  Comment: 3 glasses of wine today  ? Drug use: No  ? ? ?Family History ?Family History  ?Problem Relation Age of Onset  ? Heart disease Mother   ?     After 52 yrs of age  ? Heart attack Mother   ? Hyperlipidemia Father   ? Hypertension Father   ? Heart disease Father   ?     After 25 yrs of age  ? Diabetes Father   ? Stroke Sister   ? Colon cancer Neg Hx   ? Stomach cancer Neg Hx   ? Rectal cancer Neg Hx   ? Esophageal cancer Neg Hx   ? Liver disease Neg Hx   ? Kidney disease Neg Hx   ? ? ?Allergies ? ?Allergies  ?Allergen Reactions  ? Atorvastatin   ?  myalgia  ? Xarelto [Rivaroxaban]   ?  Blood in urine   ? Amoxicillin Rash  ? Metoprolol Itching  ?  When out in the sun  ? ? ? ?Current Outpatient Medications  ?Medication Sig Dispense Refill  ? acetaminophen (TYLENOL) 500 MG tablet  Take 1,000 mg by mouth every 6 (six) hours as needed for moderate pain.    ? allopurinol (ZYLOPRIM) 100 MG tablet Take 100 mg by mouth daily.     ? amiodarone (PACERONE) 200 MG tablet Take 0.5 tablets (100 mg

## 2021-07-30 ENCOUNTER — Encounter: Payer: Self-pay | Admitting: Physician Assistant

## 2021-08-07 ENCOUNTER — Other Ambulatory Visit: Payer: Self-pay | Admitting: *Deleted

## 2021-08-07 DIAGNOSIS — I6523 Occlusion and stenosis of bilateral carotid arteries: Secondary | ICD-10-CM

## 2021-08-07 DIAGNOSIS — I70219 Atherosclerosis of native arteries of extremities with intermittent claudication, unspecified extremity: Secondary | ICD-10-CM

## 2021-08-07 DIAGNOSIS — I7025 Atherosclerosis of native arteries of other extremities with ulceration: Secondary | ICD-10-CM

## 2021-08-16 DIAGNOSIS — I739 Peripheral vascular disease, unspecified: Secondary | ICD-10-CM | POA: Diagnosis not present

## 2021-08-16 DIAGNOSIS — I10 Essential (primary) hypertension: Secondary | ICD-10-CM | POA: Diagnosis not present

## 2021-08-16 DIAGNOSIS — I25810 Atherosclerosis of coronary artery bypass graft(s) without angina pectoris: Secondary | ICD-10-CM | POA: Diagnosis not present

## 2021-08-16 DIAGNOSIS — I5022 Chronic systolic (congestive) heart failure: Secondary | ICD-10-CM | POA: Diagnosis not present

## 2021-08-16 DIAGNOSIS — R7301 Impaired fasting glucose: Secondary | ICD-10-CM | POA: Diagnosis not present

## 2021-08-16 DIAGNOSIS — I6529 Occlusion and stenosis of unspecified carotid artery: Secondary | ICD-10-CM | POA: Diagnosis not present

## 2021-08-16 DIAGNOSIS — Z9861 Coronary angioplasty status: Secondary | ICD-10-CM | POA: Diagnosis not present

## 2021-08-16 DIAGNOSIS — D649 Anemia, unspecified: Secondary | ICD-10-CM | POA: Diagnosis not present

## 2021-08-16 DIAGNOSIS — E039 Hypothyroidism, unspecified: Secondary | ICD-10-CM | POA: Diagnosis not present

## 2021-08-20 ENCOUNTER — Ambulatory Visit (INDEPENDENT_AMBULATORY_CARE_PROVIDER_SITE_OTHER): Payer: Medicare HMO

## 2021-08-20 DIAGNOSIS — I495 Sick sinus syndrome: Secondary | ICD-10-CM | POA: Diagnosis not present

## 2021-08-21 LAB — CUP PACEART REMOTE DEVICE CHECK
Date Time Interrogation Session: 20230425083058
Implantable Lead Implant Date: 20220124
Implantable Lead Implant Date: 20220124
Implantable Lead Location: 753859
Implantable Lead Location: 753860
Implantable Lead Model: 377
Implantable Lead Model: 377
Implantable Lead Serial Number: 8000155840
Implantable Lead Serial Number: 8000166398
Implantable Pulse Generator Implant Date: 20220124
Pulse Gen Model: 407145
Pulse Gen Serial Number: 70002016

## 2021-08-25 DIAGNOSIS — I480 Paroxysmal atrial fibrillation: Secondary | ICD-10-CM | POA: Diagnosis not present

## 2021-08-25 DIAGNOSIS — E039 Hypothyroidism, unspecified: Secondary | ICD-10-CM | POA: Diagnosis not present

## 2021-08-25 DIAGNOSIS — F419 Anxiety disorder, unspecified: Secondary | ICD-10-CM | POA: Diagnosis not present

## 2021-08-25 DIAGNOSIS — I5022 Chronic systolic (congestive) heart failure: Secondary | ICD-10-CM | POA: Diagnosis not present

## 2021-08-28 ENCOUNTER — Other Ambulatory Visit: Payer: Self-pay | Admitting: Cardiology

## 2021-08-29 NOTE — Telephone Encounter (Signed)
Furosemide 40 mg # 90 tablets sent to   [place], [place] ?

## 2021-09-03 DIAGNOSIS — R300 Dysuria: Secondary | ICD-10-CM | POA: Diagnosis not present

## 2021-09-04 DIAGNOSIS — M25812 Other specified joint disorders, left shoulder: Secondary | ICD-10-CM | POA: Diagnosis not present

## 2021-09-04 NOTE — Progress Notes (Signed)
Remote pacemaker transmission.   

## 2021-09-18 ENCOUNTER — Ambulatory Visit: Payer: Medicare HMO | Admitting: Cardiology

## 2021-09-26 ENCOUNTER — Other Ambulatory Visit: Payer: Self-pay | Admitting: Cardiology

## 2021-09-27 ENCOUNTER — Telehealth: Payer: Self-pay

## 2021-09-27 ENCOUNTER — Other Ambulatory Visit: Payer: Self-pay

## 2021-09-27 ENCOUNTER — Telehealth: Payer: Self-pay | Admitting: Cardiology

## 2021-09-27 MED ORDER — AMIODARONE HCL 200 MG PO TABS: 100.0000 mg | ORAL_TABLET | Freq: Every day | ORAL | 1 refills | Status: AC

## 2021-09-27 NOTE — Telephone Encounter (Signed)
Rx refill sent to pharmacy. 

## 2021-09-27 NOTE — Telephone Encounter (Signed)
Amiodarone refill sent to [facility] per pt request.

## 2021-09-27 NOTE — Telephone Encounter (Signed)
*  STAT* If patient is at the pharmacy, call can be transferred to refill team.   1. Which medications need to be refilled? (please list name of each medication and dose if known) amiodarone (PACERONE) 200 MG tablet  2. Which pharmacy/location (including street and city if local pharmacy) is medication to be sent to? [facility], [place], [place] [address] Phone [phone]  3. Do they need a 30 day or 90 day supply? 90 day  Patient would like a confirmation when this happens.

## 2021-10-15 DIAGNOSIS — I5022 Chronic systolic (congestive) heart failure: Secondary | ICD-10-CM | POA: Diagnosis not present

## 2021-10-15 DIAGNOSIS — R0602 Shortness of breath: Secondary | ICD-10-CM | POA: Diagnosis not present

## 2021-10-15 DIAGNOSIS — I480 Paroxysmal atrial fibrillation: Secondary | ICD-10-CM | POA: Diagnosis not present

## 2021-10-15 DIAGNOSIS — N1831 Chronic kidney disease, stage 3a: Secondary | ICD-10-CM | POA: Diagnosis not present

## 2021-10-15 DIAGNOSIS — I10 Essential (primary) hypertension: Secondary | ICD-10-CM | POA: Diagnosis not present

## 2021-10-15 DIAGNOSIS — C641 Malignant neoplasm of right kidney, except renal pelvis: Secondary | ICD-10-CM | POA: Diagnosis not present

## 2021-10-15 DIAGNOSIS — D649 Anemia, unspecified: Secondary | ICD-10-CM | POA: Diagnosis not present

## 2021-10-15 DIAGNOSIS — R531 Weakness: Secondary | ICD-10-CM | POA: Diagnosis not present

## 2021-11-06 ENCOUNTER — Encounter: Payer: Self-pay | Admitting: Cardiology

## 2021-11-13 ENCOUNTER — Telehealth: Payer: Self-pay | Admitting: Family Medicine

## 2021-11-13 NOTE — Telephone Encounter (Signed)
TCT pt to set up an appointment for follow up in the next week or so.  Left a message with return contact phone number to set up appointment. Damaris Hippo FNP-C

## 2021-11-14 ENCOUNTER — Encounter: Payer: Self-pay | Admitting: Family Medicine

## 2021-11-14 ENCOUNTER — Other Ambulatory Visit: Payer: Medicare HMO | Admitting: Family Medicine

## 2021-11-14 VITALS — BP 98/60 | HR 60 | Resp 22 | Wt 142.9 lb

## 2021-11-14 DIAGNOSIS — I10 Essential (primary) hypertension: Secondary | ICD-10-CM

## 2021-11-14 DIAGNOSIS — I4819 Other persistent atrial fibrillation: Secondary | ICD-10-CM

## 2021-11-14 DIAGNOSIS — I5022 Chronic systolic (congestive) heart failure: Secondary | ICD-10-CM

## 2021-11-14 NOTE — Progress Notes (Signed)
[facility] Palliative Care Consult Note Telephone: [phone]  Fax: [phone]    Date of encounter: 11/14/21 10:36 AM PATIENT NAME: Ian Cummings [address] [place] [address]   [phone] (home)  DOB: 26-Sep-1928 MRN: [id number] PRIMARY CARE PROVIDER:    Ginger Organ., [place],  [place] [address] [phone]  REFERRING PROVIDER:   Ginger Organ., MD [address] [place],  [place] [address] [phone]  RESPONSIBLE PARTY:    Contact Information     Name Relation Home Work [place]   [phone]   [place], [place]   [phone]        I met face to face with patient in his home. Palliative Care was asked to follow this patient by consultation request of  Ginger Organ., MD to address advance care planning and complex medical decision making. This is a follow up visit.                                   ASSESSMENT, SYMPTOM MANAGEMENT AND PLAN / RECOMMENDATIONS:   Chronic systolic heart failure Remains euvolemic.  Continue to check daily weights and call for weight gain > 2-3 lbs/24 hours or 5 lbs in 5 days. Continue Furosemide 40 mg daily. Encourage continued exercise when feeling well  2.  Persistent atrial fibrillation RRR on Amiodarone 1/2 of 200 mg tablet daily. Avoiding anticoagulation with gross hematuria  3.  Essential HTN Mildly borderline hypotensive today with SBP in low 90s Encourage increased fluid intake and use of walker to prevent falls if dizzy on standing   Advance Care Planning/Goals of Care:  CODE STATUS: DNR    Follow up Palliative Care Visit: Palliative care will continue to follow for complex medical decision making, advance care planning, and clarification of goals. Return 6 weeks or prn.    This visit was coded based on medical decision making (MDM).  PPS: 60%  HOSPICE ELIGIBILITY/DIAGNOSIS: TBD  Chief Complaint:   Palliative Care is following for chronic medical management in setting of metastatic renal cancer and chronic systolic heart failure.  HISTORY OF PRESENT ILLNESS:  Ian Cummings is a 88 y.o. year old male  with metastatic renal cancer and chronic diastolic heart failure, CAD, HTN, PAD/PVD, AAA, OSA, hypothyroidism and impaired fasting glucose.  He has afib but no anticoagulation due to gross hematuria. He states that he is feeling better in the last 2 days and has more energy. Continues to cook some. He states his SBP is 110-130s. Marland Kitchen His BP this am 131/77 on automated machine then went down to 90s SBP. Weight is stable at 142.9 today.  No orthopnea, PND, CP, a little DOE but did not tolerate Entresto due to hypotension. No nausea, vomiting, constipation.  Denies pain. Pt weighs daily, keeps a log of weights, BP and exercise.  He does breathing exercises and exercises to keep his legs strong.  He has not needed his walker in several months and states being more steady on his feet. He states he has not had significant pain from his cancer.  Per notes from PCP on 10/15/21 he is only on Amiodarone 200 mg 1/2 tab daily and furosemide 40 mg daily.   History obtained from review of EMR, discussion with Ian Cummings.   I reviewed  EMR for available labs, medications, imaging, studies and related documents.  No new records since last visit except pacemaker interrogation on 08/20/21.   ROS  General: NAD Cardiovascular: denies chest pain, endorses mild DOE Pulmonary: denies cough, denies increased SOB Abdomen: endorses good appetite, denies constipation, endorses continence of bowel GU: denies dysuria, endorses continence of urine MSK:  denies increased weakness,  no falls reported Neurological: denies pain, denies insomnia Psych: Endorses positive mood Heme/lymph/immuno: denies bruises, abnormal bleeding  Physical Exam: Current and past weights: 142.9 on home scale today Constitutional: NAD General: thin   CV: S1S2, RRR with grade II squeeking murmur in LUSB, no LE edema Pulmonary: CTAB, no increased work of breathing, no cough, room air Abdomen: normo-active BS + 4 quadrants, soft and non tender, no ascites.  Midline abd bruit MSK: no sarcopenia, moves all extremities, ambulatory Neuro:  no generalized weakness,  no cognitive impairment Psych: non-anxious affect, A and O x 3   Thank you for the opportunity to participate in the care of Ian Cummings.  The palliative care team will continue to follow. Please call our office at [phone] if we can be of additional assistance.   Marijo Conception, FNP -C  COVID-19 PATIENT SCREENING TOOL Asked and negative response unless otherwise noted:   Have you had symptoms of covid, tested positive or been in contact with someone with symptoms/positive test in the past 5-10 days?  no

## 2021-11-19 ENCOUNTER — Ambulatory Visit (INDEPENDENT_AMBULATORY_CARE_PROVIDER_SITE_OTHER): Payer: Medicare HMO

## 2021-11-19 DIAGNOSIS — I495 Sick sinus syndrome: Secondary | ICD-10-CM

## 2021-11-20 LAB — CUP PACEART REMOTE DEVICE CHECK
Date Time Interrogation Session: 20230725070657
Implantable Lead Implant Date: 20220124
Implantable Lead Implant Date: 20220124
Implantable Lead Location: 753859
Implantable Lead Location: 753860
Implantable Lead Model: 377
Implantable Lead Model: 377
Implantable Lead Serial Number: 8000155840
Implantable Lead Serial Number: 8000166398
Implantable Pulse Generator Implant Date: 20220124
Pulse Gen Model: 407145
Pulse Gen Serial Number: 70002016

## 2021-12-13 ENCOUNTER — Ambulatory Visit: Payer: Medicare HMO | Admitting: Cardiology

## 2021-12-13 ENCOUNTER — Encounter: Payer: Self-pay | Admitting: Cardiology

## 2021-12-13 VITALS — BP 130/70 | HR 71 | Ht 70.0 in | Wt 144.0 lb

## 2021-12-13 DIAGNOSIS — Z950 Presence of cardiac pacemaker: Secondary | ICD-10-CM

## 2021-12-13 DIAGNOSIS — C786 Secondary malignant neoplasm of retroperitoneum and peritoneum: Secondary | ICD-10-CM | POA: Diagnosis not present

## 2021-12-13 DIAGNOSIS — I129 Hypertensive chronic kidney disease with stage 1 through stage 4 chronic kidney disease, or unspecified chronic kidney disease: Secondary | ICD-10-CM | POA: Diagnosis not present

## 2021-12-13 DIAGNOSIS — E782 Mixed hyperlipidemia: Secondary | ICD-10-CM | POA: Diagnosis not present

## 2021-12-13 DIAGNOSIS — Z951 Presence of aortocoronary bypass graft: Secondary | ICD-10-CM

## 2021-12-13 DIAGNOSIS — I25118 Atherosclerotic heart disease of native coronary artery with other forms of angina pectoris: Secondary | ICD-10-CM | POA: Diagnosis not present

## 2021-12-13 NOTE — Patient Instructions (Addendum)
Medication Instructions:  Your physician recommends that you continue on your current medications as directed. Please refer to the Current Medication list given to you today.  *If you need a refill on your cardiac medications before your next appointment, please call your pharmacy*   Lab Work:  [place]  If you have labs (blood work) drawn today and your tests are completely normal, you will receive your results only by: [place] (if you have MyChart) OR A paper copy in the mail If you have any lab test that is abnormal or we need to change your treatment, we will call you to review the results.   Testing/Procedures: None Ordered   Follow-Up: At [facility], you and your health needs are our priority.  As part of our continuing mission to provide you with exceptional heart care, we have created designated Provider Care Teams.  These Care Teams include your primary Cardiologist (physician) and Advanced Practice Providers (APPs -  Physician Assistants and Nurse Practitioners) who all work together to provide you with the care you need, when you need it.  We recommend signing up for the patient portal called "MyChart".  Sign up information is provided on this After Visit Summary.  MyChart is used to connect with patients for Virtual Visits (Telemedicine).  Patients are able to view lab/test results, encounter notes, upcoming appointments, etc.  Non-urgent messages can be sent to your provider as well.   To learn more about what you can do with MyChart, go to [email or web address].    Your next appointment:   6 month(s)  The format for your next appointment:   In Person  Provider:   Jenne Campus, MD    Other Instructions NA

## 2021-12-13 NOTE — Progress Notes (Unsigned)
Cardiology Office Note:    Date:  12/13/2021   ID:  Ian Cummings, DOB [birth year], MRN [id number]  PCP:  Ginger Organ., MD  Cardiologist:  Jenne Campus, MD    Referring MD: Ginger Organ., MD   No chief complaint on file. Doing fair  History of Present Illness:    Ian Cummings is a 88 y.o. male  with past medical history significant for coronary artery disease.  He does have remote coronary artery bypass graft he did have acute coronary syndrome in July 2021 with both grafts occluded and severe native CAD not amendable for any future revascularization, paroxysmal atrial fibrillation maintaining sinus rhythm on amiodarone, symptomatic bradycardia with sinus pauses required permanent pacemaker, he is chronically anticoagulated, hypertensive heart disease, cardiomyopathy with ejection fraction 25 to 30%, chronic kidney failure.  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  He is on palliative care. He comes today to my office for follow-up and after admit he looks pretty good for all the problems he have look like he lost some weight.  He denies have any chest pain he said that he sleeps a lot.  He denies having any chest pain tightness squeezing pressure burning chest.  Past Medical History:  Diagnosis Date   AAA (abdominal aortic aneurysm) ([place])    2004, Pt stated not there now   Anginal pain ([place])    Anxiety    Arthritis    Atherosclerosis of native arteries of the extremities with intermittent claudication 12/04/2011   ABI .84 and .54 in 2012    Atherosclerotic PVD with intermittent claudication ([place]) 02/20/2014   BPH (benign prostatic hypertrophy)    Bradycardia 01/12/2019   Bradycardia on ECG 11/11/2017   CAD (coronary artery disease)    Carotid artery disease ([place])    Right carotid stent 2013   Carotid artery disease without cerebral infarction [facility])    S/p right carotid stent for asymptomatic disease  2013    Chronic diastolic  (congestive) heart failure ([place]) 09/10/2019   Chronic kidney disease, stage 3a ([place]) 09/10/2019   Claudication ([place])    Colon polyps    adenomatous   Complex sleep apnea syndrome 11/11/2017   Conjunctival lesion 05/04/2014   Formatting of this note might be different from the original. left   Coronary artery disease    Coronary artery disease involving native coronary artery of native heart with unstable angina pectoris ([place]) 09/06/2012   CABG w LIMA to LAD, SVG to OM1-OM2, SVG to dx, SVG to PD/PL 1995,  Cypher stent x 2 SVG to RCA and Cypher stent to Circ Graft 05/13/07, stent of SVG to circ OM in [place] in July 2013  Cath 4/14 [facility] normal Left main, occluded mid LAD, occluded RCA SVG, occluded CFX, widely patent OM 1 SVG, occluded RCA SVG, occluded Diag 1 SVG, widely patent LAD LIMA graft;  Redo CABG Dr. Servando Snare  10/18/12 with    Depression    Essential hypertension 01/12/2019   GERD (gastroesophageal reflux disease)    Gout    H/O hiatal hernia    Hyperlipidemia    Hypertension    Hypertension, renal disease, stage 1-4 or unspecified chronic kidney disease    Hypothyroidism    Malnutrition of moderate degree 08/05/2015   MI (myocardial infarction) ([place]) 4/14   Mixed hyperlipidemia    Neuropathy    Hx: of in B/L toes   OSA on CPAP 02/17/2018   PAD (peripheral artery  disease) ([place]) 08/03/2015   PAF (paroxysmal atrial fibrillation) ([place])    Peripheral vascular disease ([place])    with claudication   Pre-diabetes    RBBB 01/12/2019   s/p CABG    Shoulder pain 01/27/2019   Stroke [facility])    TIA (transient ischemic attack)    Hx: of   Unstable angina ([place]) 09/10/2019    Past Surgical History:  Procedure Laterality Date   CARDIOVERSION N/A 02/22/2020   Procedure: CARDIOVERSION;  Surgeon: [name], MD;  Location: [place];  Service: Cardiovascular;  Laterality: N/A;   CAROTID STENT INSERTION Right 2013   right at [place] EXTRACTION Bilateral    Hx: of both  eyes   COLONOSCOPY     Hx: of   COLONOSCOPY     CORONARY ANGIOPLASTY WITH STENT PLACEMENT  2010   CORONARY ARTERY BYPASS GRAFT  1995   CORONARY ARTERY BYPASS GRAFT N/A 10/18/2012   Procedure: REDO CORONARY ARTERY BYPASS GRAFTING (CABG);  Surgeon: Grace Isaac, MD;  Location: [place];  Service: Open Heart Surgery;  Laterality: N/A;  off pump times two using endoscopically harvested left saphenous vein   CORONARY STENT PLACEMENT  06/2016   ENDARTERECTOMY FEMORAL Right 08/03/2015   Procedure: RIGHT FEMORAL ENDARTERECTOMY WITH PATCH ANGIOPLASTY;  Surgeon: Serafina Mitchell, MD;  Location: [place];  Service: Vascular;  Laterality: Right;   FEMORAL-POPLITEAL BYPASS GRAFT Right 08/03/2015   Procedure: RIGHT FEMORAL-BELOW KNEE POPLITEAL ARTERY BYPASS GRAFT;  Surgeon: Serafina Mitchell, MD;  Location: [place];  Service: Vascular;  Laterality: Right;   FOOT SURGERY Left    FRACTURE SURGERY Left    Hx: of Left heel   INGUINAL HERNIA REPAIR Bilateral    X 2    INTRAOPERATIVE TRANSESOPHAGEAL ECHOCARDIOGRAM N/A 10/18/2012   Procedure: INTRAOPERATIVE TRANSESOPHAGEAL ECHOCARDIOGRAM;  Surgeon: Grace Isaac, MD;  Location: [place];  Service: Open Heart Surgery;  Laterality: N/A;   LEFT HEART CATH AND CORS/GRAFTS ANGIOGRAPHY N/A 10/25/2019   Procedure: LEFT HEART CATH AND CORS/GRAFTS ANGIOGRAPHY;  Surgeon: Leonie Man, MD;  Location: [place] CV LAB;  Service: Cardiovascular;  Laterality: N/A;   LEFT HEART CATHETERIZATION WITH CORONARY/GRAFT ANGIOGRAM N/A 02/02/2013   Procedure: LEFT HEART CATHETERIZATION WITH Beatrix Fetters;  Surgeon: Jacolyn Reedy, MD;  Location: [facility] CATH LAB;  Service: Cardiovascular;  Laterality: N/A;   LOWER EXTREMITY ANGIOGRAM Right 06/27/2015   Procedure: Lower Extremity Angiogram;  Surgeon: Serafina Mitchell, MD;  Location: [place] CV LAB;  Service: Cardiovascular;  Laterality: Right;   MASTOIDECTOMY  1933   PACEMAKER IMPLANT N/A 05/21/2020   Procedure: PACEMAKER IMPLANT;   Surgeon: Vickie Epley, MD;  Location: [place] CV [facility];  Service: Cardiovascular;  Laterality: N/A;   PERIPHERAL VASCULAR CATHETERIZATION N/A 01/23/2015   Procedure: Abdominal Aortogram;  Surgeon: Serafina Mitchell, MD;  Location: [place] CV LAB;  Service: Cardiovascular;  Laterality: N/A;   PERIPHERAL VASCULAR CATHETERIZATION N/A 06/27/2015   Procedure: Abdominal Aortogram;  Surgeon: Serafina Mitchell, MD;  Location: [place] CV LAB;  Service: Cardiovascular;  Laterality: N/A;   RETINAL DETACHMENT SURGERY Left    Hx: of left eye   TEE WITHOUT CARDIOVERSION N/A 02/22/2020   Procedure: TRANSESOPHAGEAL ECHOCARDIOGRAM (TEE);  Surgeon: [name], MD;  Location: [facility] ENDOSCOPY;  Service: Cardiovascular;  Laterality: N/A;   TONSILLECTOMY      Current Medications: No outpatient medications have been marked as taking for the 12/13/21 encounter (Appointment) with Park Liter, MD.  Allergies:   Atorvastatin, Xarelto [rivaroxaban], Amoxicillin, and Metoprolol   Social History   Socioeconomic History   Marital status: Married    Spouse name: Not on file   Number of children: 3   Years of education: Not on file   Highest education level: Not on file  Occupational History   Occupation: retired  Tobacco Use   Smoking status: Former    Types: Cigarettes    Quit date: 04/29/1975    Years since quitting: 46.6    Passive exposure: Never   Smokeless tobacco: Never  Vaping Use   Vaping Use: Never used  Substance and Sexual Activity   Alcohol use: Yes    Alcohol/week: 3.0 standard drinks of alcohol    Types: 3 Glasses of wine per week    Comment: 3 glasses of wine today   Drug use: No   Sexual activity: Not Currently  Other Topics Concern   Not on file  Social History Narrative   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 There is no history of Colon cancer, Stomach cancer, Rectal cancer, Esophageal cancer, Liver disease, or Kidney disease. ROS:   Please see the history of present illness.    All 14 point review of systems negative except as described per history of present illness  EKGs/Labs/Other Studies Reviewed:      Recent Labs: 03/05/2021: ALT 45; TSH 10.000 05/16/2021: Hemoglobin 12.2; Platelets 161 06/18/2021: BUN 27; Creatinine, Ser 1.25; NT-Pro BNP 3,849; Potassium 4.2; Sodium 143  Recent Lipid Panel    Component Value Date/Time   CHOL 121 01/23/2020 1040   TRIG 191 (H) 01/23/2020 1040   HDL 30 (L) 01/23/2020 1040   CHOLHDL 4.0 01/23/2020 1040   CHOLHDL 3.2 04/18/2009 0241   VLDL 23 04/18/2009 0241   LDLCALC 59 01/23/2020 1040    Physical Exam:    VS:  There were no vitals taken for this visit.    Wt Readings from Last 3 Encounters:  11/14/21 142 lb 14.4 oz (64.8 kg)  07/29/21 151 lb 1.6 oz (68.5 kg)  06/18/21 146 lb (66.2 kg)     GEN:  Well nourished, well developed in no acute distress HEENT: Normal NECK: No JVD; No carotid bruits LYMPHATICS: No lymphadenopathy CARDIAC: RRR, no murmurs, no rubs, no gallops RESPIRATORY:  Clear to auscultation without rales, wheezing or rhonchi  ABDOMEN: Soft, non-tender, non-distended MUSCULOSKELETAL:  No edema; No deformity  SKIN: Warm and dry LOWER EXTREMITIES: no swelling NEUROLOGIC:  Alert and oriented x 3 PSYCHIATRIC:  Normal affect   ASSESSMENT:    1. Coronary artery disease of native artery of native heart with stable angina pectoris ([place])   2. s/p CABG   3. Secondary malignant neoplasm of retroperitoneum and peritoneum ([place])    4. Pacemaker   5. Mixed dyslipidemia   6. Chronic kidney disease due to hypertension    PLAN:    In order of problems listed above:  Coronary disease all grafts occluded not candidate for intervention.  Conservative approach Cancer with metastasis, palliative care.  He does not want to have follow-up for that. Pacemaker I did review interrogation that is a [employer] device parameters are stable.  1% burden of atrial fibrillation.  He is on amiodarone 100 mg daily which I will continue Mixed dyslipidemia, I do have fasting lipid profile from January with LDL of 64 HDL 32.  Overall he is doing well he is on palliative care.   Medication Adjustments/Labs and Tests Ordered: Current medicines are reviewed at length with the patient today.  Concerns regarding medicines are outlined above.  No orders of the defined types were placed in this encounter.  Medication changes: No orders of the defined types were placed in this encounter.   Signed, Park Liter, MD, [facility] 12/13/2021 4:19 PM    [place]

## 2021-12-17 NOTE — Progress Notes (Signed)
Remote pacemaker transmission.   

## 2022-01-01 DIAGNOSIS — I5022 Chronic systolic (congestive) heart failure: Secondary | ICD-10-CM | POA: Diagnosis not present

## 2022-01-01 DIAGNOSIS — I10 Essential (primary) hypertension: Secondary | ICD-10-CM | POA: Diagnosis not present

## 2022-01-01 DIAGNOSIS — C786 Secondary malignant neoplasm of retroperitoneum and peritoneum: Secondary | ICD-10-CM | POA: Diagnosis not present

## 2022-01-01 DIAGNOSIS — R530 Neoplastic (malignant) related fatigue: Secondary | ICD-10-CM | POA: Diagnosis not present

## 2022-01-01 DIAGNOSIS — C641 Malignant neoplasm of right kidney, except renal pelvis: Secondary | ICD-10-CM | POA: Diagnosis not present

## 2022-01-01 DIAGNOSIS — E039 Hypothyroidism, unspecified: Secondary | ICD-10-CM | POA: Diagnosis not present

## 2022-01-01 DIAGNOSIS — Z7189 Other specified counseling: Secondary | ICD-10-CM | POA: Diagnosis not present

## 2022-02-01 ENCOUNTER — Other Ambulatory Visit: Payer: Self-pay | Admitting: Cardiology

## 2022-02-18 ENCOUNTER — Ambulatory Visit (INDEPENDENT_AMBULATORY_CARE_PROVIDER_SITE_OTHER): Payer: Medicare HMO

## 2022-02-18 DIAGNOSIS — I495 Sick sinus syndrome: Secondary | ICD-10-CM

## 2022-02-20 LAB — CUP PACEART REMOTE DEVICE CHECK
Date Time Interrogation Session: 20231025095243
Implantable Lead Connection Status: 753985
Implantable Lead Connection Status: 753985
Implantable Lead Implant Date: 20220124
Implantable Lead Implant Date: 20220124
Implantable Lead Location: 753859
Implantable Lead Location: 753860
Implantable Lead Model: 377
Implantable Lead Model: 377
Implantable Lead Serial Number: 8000155840
Implantable Lead Serial Number: 8000166398
Implantable Pulse Generator Implant Date: 20220124
Pulse Gen Model: 407145
Pulse Gen Serial Number: 70002016

## 2022-02-26 DEATH — deceased

## 2022-03-10 ENCOUNTER — Telehealth: Payer: Self-pay

## 2022-03-10 NOTE — Telephone Encounter (Signed)
Received the following alert from [employer]:  No messages received for at least 21 days Last message received 23 days ago. The patient will be deactivated in 67 days.  Spoke with wife, patient passed away on 2022-03-13.   Will indicate deceased in chart/[facility] and with [employer].

## 2022-03-10 NOTE — Progress Notes (Signed)
Remote pacemaker transmission.   

## 2022-03-10 NOTE — Addendum Note (Signed)
Addended by: Cheri Kearns A on: 03/10/2022 10:46 AM   Modules accepted: Level of Service

## 2022-05-16 ENCOUNTER — Telehealth: Payer: Self-pay

## 2022-05-16 NOTE — Telephone Encounter (Signed)
Per patient's wife via answering svc message on 2022-05-20, pt passed away on February 20, 2022. [facility] has been informed to confirm and change ot status in EPIC to deceased. All appointments, orders, and referrals have been cancelled.

## 2022-06-03 ENCOUNTER — Ambulatory Visit: Payer: Medicare HMO | Admitting: Cardiology
